# Patient Record
Sex: Male | Born: 1950
Health system: Southern US, Community
[De-identification: ages and names within clinical notes are randomized; demographics above are authoritative.]

## PROBLEM LIST (undated history)

## (undated) DIAGNOSIS — Z974 Presence of external hearing-aid: Secondary | ICD-10-CM

## (undated) DIAGNOSIS — C801 Malignant (primary) neoplasm, unspecified: Secondary | ICD-10-CM

## (undated) DIAGNOSIS — H9190 Unspecified hearing loss, unspecified ear: Secondary | ICD-10-CM

## (undated) DIAGNOSIS — I639 Cerebral infarction, unspecified: Secondary | ICD-10-CM

## (undated) DIAGNOSIS — E039 Hypothyroidism, unspecified: Secondary | ICD-10-CM

## (undated) DIAGNOSIS — T7840XA Allergy, unspecified, initial encounter: Secondary | ICD-10-CM

## (undated) DIAGNOSIS — Z973 Presence of spectacles and contact lenses: Secondary | ICD-10-CM

## (undated) DIAGNOSIS — I10 Essential (primary) hypertension: Secondary | ICD-10-CM

## (undated) DIAGNOSIS — M2410 Other articular cartilage disorders, unspecified site: Secondary | ICD-10-CM

## (undated) DIAGNOSIS — K219 Gastro-esophageal reflux disease without esophagitis: Secondary | ICD-10-CM

## (undated) DIAGNOSIS — M1990 Unspecified osteoarthritis, unspecified site: Secondary | ICD-10-CM

## (undated) DIAGNOSIS — Z9889 Other specified postprocedural states: Secondary | ICD-10-CM

## (undated) DIAGNOSIS — K5792 Diverticulitis of intestine, part unspecified, without perforation or abscess without bleeding: Secondary | ICD-10-CM

## (undated) DIAGNOSIS — E785 Hyperlipidemia, unspecified: Secondary | ICD-10-CM

## (undated) DIAGNOSIS — I2510 Atherosclerotic heart disease of native coronary artery without angina pectoris: Secondary | ICD-10-CM

## (undated) DIAGNOSIS — G4730 Sleep apnea, unspecified: Secondary | ICD-10-CM

## (undated) DIAGNOSIS — K654 Sclerosing mesenteritis: Secondary | ICD-10-CM

## (undated) HISTORY — DX: Hyperlipidemia, unspecified: E78.5

## (undated) HISTORY — PX: COLONOSCOPY: SHX174

## (undated) HISTORY — DX: Cerebral infarction, unspecified: I63.9

## (undated) HISTORY — DX: Allergy, unspecified, initial encounter: T78.40XA

## (undated) HISTORY — DX: Other specified postprocedural states: Z98.890

## (undated) HISTORY — DX: Essential (primary) hypertension: I10

## (undated) HISTORY — DX: Unspecified hearing loss, unspecified ear: H91.90

## (undated) HISTORY — PX: SPINE SURGERY: SHX786

## (undated) HISTORY — DX: Diverticulitis of intestine, part unspecified, without perforation or abscess without bleeding: K57.92

## (undated) HISTORY — DX: Gastro-esophageal reflux disease without esophagitis: K21.9

## (undated) HISTORY — DX: Hypothyroidism, unspecified: E03.9

## (undated) HISTORY — DX: Unspecified osteoarthritis, unspecified site: M19.90

---

## 1976-12-23 HISTORY — PX: BACK SURGERY: SHX140

## 2006-05-06 LAB — HM COLONOSCOPY: HM Colonoscopy: NORMAL

## 2006-05-21 ENCOUNTER — Encounter (INDEPENDENT_AMBULATORY_CARE_PROVIDER_SITE_OTHER): Payer: Self-pay | Admitting: *Deleted

## 2006-06-13 ENCOUNTER — Encounter (INDEPENDENT_AMBULATORY_CARE_PROVIDER_SITE_OTHER): Payer: Self-pay | Admitting: *Deleted

## 2006-06-16 ENCOUNTER — Encounter (INDEPENDENT_AMBULATORY_CARE_PROVIDER_SITE_OTHER): Payer: Self-pay | Admitting: *Deleted

## 2006-08-23 HISTORY — PX: HEMORRHOID SURGERY: SHX153

## 2008-05-18 ENCOUNTER — Encounter (INDEPENDENT_AMBULATORY_CARE_PROVIDER_SITE_OTHER): Payer: Self-pay | Admitting: *Deleted

## 2008-05-26 ENCOUNTER — Encounter: Payer: Self-pay | Admitting: Internal Medicine

## 2008-10-12 ENCOUNTER — Encounter (INDEPENDENT_AMBULATORY_CARE_PROVIDER_SITE_OTHER): Payer: Self-pay | Admitting: *Deleted

## 2009-02-08 ENCOUNTER — Ambulatory Visit: Payer: Self-pay | Admitting: *Deleted

## 2009-02-08 DIAGNOSIS — I10 Essential (primary) hypertension: Secondary | ICD-10-CM | POA: Insufficient documentation

## 2009-02-08 DIAGNOSIS — E785 Hyperlipidemia, unspecified: Secondary | ICD-10-CM

## 2009-02-08 DIAGNOSIS — H9190 Unspecified hearing loss, unspecified ear: Secondary | ICD-10-CM | POA: Insufficient documentation

## 2009-02-08 DIAGNOSIS — R809 Proteinuria, unspecified: Secondary | ICD-10-CM

## 2009-02-08 DIAGNOSIS — Z8719 Personal history of other diseases of the digestive system: Secondary | ICD-10-CM

## 2009-02-08 DIAGNOSIS — K219 Gastro-esophageal reflux disease without esophagitis: Secondary | ICD-10-CM | POA: Insufficient documentation

## 2009-02-08 DIAGNOSIS — J309 Allergic rhinitis, unspecified: Secondary | ICD-10-CM

## 2009-02-08 DIAGNOSIS — E782 Mixed hyperlipidemia: Secondary | ICD-10-CM | POA: Insufficient documentation

## 2009-02-14 DIAGNOSIS — M1990 Unspecified osteoarthritis, unspecified site: Secondary | ICD-10-CM | POA: Insufficient documentation

## 2009-03-10 ENCOUNTER — Ambulatory Visit: Payer: Self-pay | Admitting: *Deleted

## 2009-03-10 DIAGNOSIS — D485 Neoplasm of uncertain behavior of skin: Secondary | ICD-10-CM

## 2009-03-10 LAB — CONVERTED CEMR LAB
ALT: 48 units/L (ref 0–53)
AST: 36 units/L (ref 0–37)
Albumin: 4.6 g/dL (ref 3.5–5.2)
Alkaline Phosphatase: 52 units/L (ref 39–117)
BUN: 20 mg/dL (ref 6–23)
Basophils Absolute: 0 10*3/uL (ref 0.0–0.1)
Basophils Relative: 0.2 % (ref 0.0–3.0)
Bilirubin Urine: NEGATIVE
Bilirubin Urine: NEGATIVE
Blood in Urine, dipstick: NEGATIVE
CO2: 31 meq/L (ref 19–32)
Calcium: 9.9 mg/dL (ref 8.4–10.5)
Chloride: 102 meq/L (ref 96–112)
Cholesterol: 170 mg/dL (ref 0–200)
Creatinine, Ser: 0.8 mg/dL (ref 0.4–1.5)
Eosinophils Absolute: 0.4 10*3/uL (ref 0.0–0.7)
Eosinophils Relative: 6.5 % — ABNORMAL HIGH (ref 0.0–5.0)
GFR calc non Af Amer: 106.57 mL/min (ref >60)
Glucose, Bld: 101 mg/dL — ABNORMAL HIGH (ref 70–99)
Glucose, Urine, Semiquant: NEGATIVE
HCT: 44.7 % (ref 39.0–52.0)
HDL: 37.2 mg/dL — ABNORMAL LOW (ref >39.00)
Hemoglobin, Urine: NEGATIVE
Hemoglobin: 15.9 g/dL (ref 13.0–17.0)
Ketones, ur: NEGATIVE mg/dL
Ketones, urine, test strip: NEGATIVE
LDL Cholesterol: 106 mg/dL — ABNORMAL HIGH (ref 0–99)
Leukocytes, UA: NEGATIVE
Lymphocytes Relative: 36.8 % (ref 12.0–46.0)
Lymphs Abs: 2.3 10*3/uL (ref 0.7–4.0)
MCHC: 35.6 g/dL (ref 30.0–36.0)
MCV: 91.4 fL (ref 78.0–100.0)
Monocytes Absolute: 0.5 10*3/uL (ref 0.1–1.0)
Monocytes Relative: 7.3 % (ref 3.0–12.0)
Neutro Abs: 3.1 10*3/uL (ref 1.4–7.7)
Neutrophils Relative %: 49.2 % (ref 43.0–77.0)
Nitrite: NEGATIVE
Nitrite: NEGATIVE
Platelets: 226 10*3/uL (ref 150.0–400.0)
Potassium: 4.1 meq/L (ref 3.5–5.1)
Protein, U semiquant: NEGATIVE
RBC: 4.89 M/uL (ref 4.22–5.81)
RDW: 12.1 % (ref 11.5–14.6)
Sodium: 139 meq/L (ref 135–145)
Specific Gravity, Urine: <1.005
Specific Gravity, Urine: 1.01 (ref 1.000–1.030)
TSH: 4.51 microintl units/mL (ref 0.35–5.50)
Total Bilirubin: 0.9 mg/dL (ref 0.3–1.2)
Total CHOL/HDL Ratio: 5
Total Protein, Urine: NEGATIVE mg/dL
Total Protein: 7.7 g/dL (ref 6.0–8.3)
Triglycerides: 135 mg/dL (ref 0.0–149.0)
Urine Glucose: NEGATIVE mg/dL
Urobilinogen, UA: 0.2
Urobilinogen, UA: 0.2 (ref 0.0–1.0)
VLDL: 27 mg/dL (ref 0.0–40.0)
WBC Urine, dipstick: NEGATIVE
WBC: 6.3 10*3/uL (ref 4.5–10.5)
pH: 5
pH: 5.5 (ref 5.0–8.0)

## 2009-07-10 ENCOUNTER — Ambulatory Visit: Payer: Self-pay | Admitting: Occupational Medicine

## 2009-07-17 ENCOUNTER — Ambulatory Visit: Payer: Self-pay | Admitting: Family Medicine

## 2009-09-05 ENCOUNTER — Ambulatory Visit: Payer: Self-pay | Admitting: Internal Medicine

## 2009-10-18 ENCOUNTER — Encounter: Payer: Self-pay | Admitting: Internal Medicine

## 2009-11-21 ENCOUNTER — Ambulatory Visit: Payer: Self-pay | Admitting: Internal Medicine

## 2009-11-24 ENCOUNTER — Telehealth: Payer: Self-pay | Admitting: Internal Medicine

## 2009-11-27 ENCOUNTER — Encounter: Payer: Self-pay | Admitting: Internal Medicine

## 2009-11-30 ENCOUNTER — Ambulatory Visit: Payer: Self-pay | Admitting: Internal Medicine

## 2009-11-30 LAB — CONVERTED CEMR LAB
ALT: 33 units/L (ref 0–53)
AST: 25 units/L (ref 0–37)
Albumin: 4.5 g/dL (ref 3.5–5.2)
Alkaline Phosphatase: 55 units/L (ref 39–117)
BUN: 20 mg/dL (ref 6–23)
Bilirubin, Direct: 0.1 mg/dL (ref 0.0–0.3)
CO2: 24 meq/L (ref 19–32)
Calcium: 9.4 mg/dL (ref 8.4–10.5)
Chloride: 102 meq/L (ref 96–112)
Cholesterol: 155 mg/dL (ref 0–200)
Creatinine, Ser: 1.18 mg/dL (ref 0.40–1.50)
Glucose, Bld: 98 mg/dL (ref 70–99)
HDL: 35 mg/dL — ABNORMAL LOW (ref >39)
Indirect Bilirubin: 0.4 mg/dL (ref 0.0–0.9)
LDL Cholesterol: 94 mg/dL (ref 0–99)
Potassium: 4.5 meq/L (ref 3.5–5.3)
Sodium: 138 meq/L (ref 135–145)
TSH: 8.99 microintl units/mL — ABNORMAL HIGH (ref 0.350–4.500)
Total Bilirubin: 0.5 mg/dL (ref 0.3–1.2)
Total CHOL/HDL Ratio: 4.4
Total Protein: 7.2 g/dL (ref 6.0–8.3)
Triglycerides: 129 mg/dL (ref <150)
VLDL: 26 mg/dL (ref 0–40)

## 2009-12-08 ENCOUNTER — Ambulatory Visit: Payer: Self-pay | Admitting: Internal Medicine

## 2009-12-08 DIAGNOSIS — E039 Hypothyroidism, unspecified: Secondary | ICD-10-CM

## 2009-12-28 ENCOUNTER — Ambulatory Visit: Payer: Self-pay | Admitting: Family Medicine

## 2010-01-04 ENCOUNTER — Encounter: Payer: Self-pay | Admitting: Internal Medicine

## 2010-01-27 ENCOUNTER — Ambulatory Visit: Payer: Self-pay | Admitting: Family Medicine

## 2010-02-13 ENCOUNTER — Ambulatory Visit: Payer: Self-pay | Admitting: Internal Medicine

## 2010-02-13 LAB — CONVERTED CEMR LAB: TSH: 3.738 microintl units/mL (ref 0.350–4.500)

## 2010-02-14 ENCOUNTER — Telehealth: Payer: Self-pay | Admitting: Internal Medicine

## 2010-04-09 ENCOUNTER — Ambulatory Visit: Payer: Self-pay | Admitting: Internal Medicine

## 2010-04-09 DIAGNOSIS — M722 Plantar fascial fibromatosis: Secondary | ICD-10-CM

## 2010-04-09 LAB — CONVERTED CEMR LAB: TSH: 3.362 microintl units/mL (ref 0.350–4.500)

## 2010-04-12 ENCOUNTER — Encounter: Payer: Self-pay | Admitting: Internal Medicine

## 2010-04-15 ENCOUNTER — Telehealth: Payer: Self-pay | Admitting: Internal Medicine

## 2010-06-07 ENCOUNTER — Encounter: Payer: Self-pay | Admitting: Internal Medicine

## 2010-06-07 LAB — CONVERTED CEMR LAB: TSH: 2.857 microintl units/mL (ref 0.350–4.500)

## 2010-06-11 ENCOUNTER — Ambulatory Visit: Payer: Self-pay | Admitting: Internal Medicine

## 2010-07-31 ENCOUNTER — Inpatient Hospital Stay (HOSPITAL_COMMUNITY): Admission: AD | Admit: 2010-07-31 | Discharge: 2010-08-02 | Payer: Self-pay | Admitting: Cardiology

## 2010-07-31 ENCOUNTER — Ambulatory Visit: Payer: Self-pay | Admitting: Cardiology

## 2010-07-31 ENCOUNTER — Ambulatory Visit: Payer: Self-pay | Admitting: Diagnostic Radiology

## 2010-07-31 ENCOUNTER — Telehealth: Payer: Self-pay | Admitting: Internal Medicine

## 2010-07-31 ENCOUNTER — Encounter: Payer: Self-pay | Admitting: Emergency Medicine

## 2010-07-31 ENCOUNTER — Ambulatory Visit: Payer: Self-pay | Admitting: Internal Medicine

## 2010-08-01 HISTORY — PX: CARDIAC CATHETERIZATION: SHX172

## 2010-08-09 ENCOUNTER — Ambulatory Visit: Payer: Self-pay | Admitting: Internal Medicine

## 2010-08-10 ENCOUNTER — Encounter: Payer: Self-pay | Admitting: Internal Medicine

## 2010-09-05 ENCOUNTER — Ambulatory Visit: Payer: Self-pay | Admitting: Cardiology

## 2010-09-05 ENCOUNTER — Encounter: Payer: Self-pay | Admitting: Cardiology

## 2010-09-24 ENCOUNTER — Ambulatory Visit: Payer: Self-pay | Admitting: Family Medicine

## 2010-09-25 ENCOUNTER — Ambulatory Visit: Payer: Self-pay | Admitting: Internal Medicine

## 2010-09-27 ENCOUNTER — Ambulatory Visit (HOSPITAL_BASED_OUTPATIENT_CLINIC_OR_DEPARTMENT_OTHER): Admission: RE | Admit: 2010-09-27 | Discharge: 2010-09-27 | Payer: Self-pay | Admitting: Internal Medicine

## 2010-09-27 ENCOUNTER — Ambulatory Visit: Payer: Self-pay | Admitting: Diagnostic Radiology

## 2010-11-02 ENCOUNTER — Encounter: Payer: Self-pay | Admitting: Internal Medicine

## 2010-11-09 ENCOUNTER — Telehealth: Payer: Self-pay | Admitting: Internal Medicine

## 2010-11-12 ENCOUNTER — Ambulatory Visit: Payer: Self-pay | Admitting: Internal Medicine

## 2010-11-12 DIAGNOSIS — C61 Malignant neoplasm of prostate: Secondary | ICD-10-CM

## 2010-11-16 ENCOUNTER — Ambulatory Visit: Payer: Self-pay | Admitting: Family Medicine

## 2010-11-21 ENCOUNTER — Encounter: Payer: Self-pay | Admitting: Internal Medicine

## 2010-11-29 ENCOUNTER — Telehealth: Payer: Self-pay | Admitting: Internal Medicine

## 2010-12-10 ENCOUNTER — Telehealth: Payer: Self-pay | Admitting: Internal Medicine

## 2010-12-14 ENCOUNTER — Telehealth: Payer: Self-pay | Admitting: Internal Medicine

## 2010-12-31 ENCOUNTER — Ambulatory Visit
Admission: RE | Admit: 2010-12-31 | Discharge: 2010-12-31 | Payer: Self-pay | Source: Home / Self Care | Attending: Family Medicine | Admitting: Family Medicine

## 2010-12-31 DIAGNOSIS — M25529 Pain in unspecified elbow: Secondary | ICD-10-CM | POA: Insufficient documentation

## 2010-12-31 DIAGNOSIS — M7710 Lateral epicondylitis, unspecified elbow: Secondary | ICD-10-CM | POA: Insufficient documentation

## 2011-01-08 ENCOUNTER — Encounter
Admission: RE | Admit: 2011-01-08 | Discharge: 2011-01-21 | Payer: Self-pay | Source: Home / Self Care | Attending: Family Medicine | Admitting: Family Medicine

## 2011-01-22 NOTE — Letter (Signed)
Summary: New Patient letter  [facility]  [address] [place], [place] [address]   Phone: [phone]  Fax: [phone]       08/10/2010 MRN: [id number]  Zachary [name] [address] [place], [place]  [address]  Dear Zachary [name],  Welcome to the Gastroenterology Division at [facility].    You are scheduled to see Dr.  Marina Goodell  on 09-25-10 at 3:30pm on the 3rd floor at [facility], [address]  We ask that you try to arrive at our office 15 minutes prior to your appointment time to allow for check-in.  We would like you to complete the enclosed self-administered evaluation form prior to your visit and bring it with you on the day of your appointment.  We will review it with you.  Also, please bring a complete list of all your medications or, if you prefer, bring the medication bottles and we will list them.  Please bring your insurance card so that we may make a copy of it.  If your insurance requires a referral to see a specialist, please bring your referral form from your primary care physician.  Co-payments are due at the time of your visit and may be paid by cash, check or credit card.     Your office visit will consist of a consult with your physician (includes a physical exam), any laboratory testing he/she may order, scheduling of any necessary diagnostic testing (e.g. x-ray, ultrasound, CT-scan), and scheduling of a procedure (e.g. Endoscopy, Colonoscopy) if required.  Please allow enough time on your schedule to allow for any/all of these possibilities.    If you cannot keep your appointment, please call [phone] to cancel or reschedule prior to your appointment date.  This allows [place] the opportunity to schedule an appointment for another patient in need of care.  If you do not cancel or reschedule by 5 p.m. the business day prior to your appointment date, you will be charged a $50.00 late cancellation/no-show fee.    Thank you for choosing  [facility] for your medical needs.  We appreciate the opportunity to care for you.  Please visit [place] at our website  to learn more about our practice.                     Sincerely,                                                             The Gastroenterology Division

## 2011-01-22 NOTE — Assessment & Plan Note (Signed)
Summary: EARACHE/KH   Vital Signs:  Patient Profile:   60 Years Old Male CC:      Rt earache x 2 days Height:     68 inches Weight:      196 pounds O2 Sat:      94 % O2 treatment:    Room Air Temp:     97.1 degrees F oral Pulse rate:   87 / minute Pulse rhythm:   regular Resp:     12 per minute BP sitting:   127 / 76  (right arm) Cuff size:   regular  Vitals Entered By: Emilio Math (December 28, 2009 3:12 PM)                  Current Allergies: ! SIMVASTATINHistory of Present Illness Chief Complaint: Rt earache x 2 days History of Present Illness: RIGHT EAR DISCOMFORT ONSET 2 DAYS. USES HEARINGAIDS. HAS NOTED SOME BLOOD ON PILLOW. NO RUNNY NOSE, NOS COUGH, NO FEVER.   Current Meds LISINOPRIL-HYDROCHLOROTHIAZIDE 10-12.5 MG TABS (LISINOPRIL-HYDROCHLOROTHIAZIDE) Take 1/2 tablet by mouth once a day OMEPRAZOLE 20 MG CPDR (OMEPRAZOLE) Take 1 tablet by mouth once a day GNP LORATADINE 10 MG TABS (LORATADINE) Take 1 tablet by mouth once a day PRAVASTATIN SODIUM 40 MG TABS (PRAVASTATIN SODIUM) one by mouth qpm MULTIVITAMINS  TABS (MULTIPLE VITAMIN) Take 1 tablet by mouth once a day FISH OIL 1200 MG CAPS (OMEGA-3 FATTY ACIDS) 2 capsules by mouth once daily LEVOTHYROXINE SODIUM 50 MCG TABS (LEVOTHYROXINE SODIUM) 1/2 tabs x 2 weeks, then one by mouth once daily  REVIEW OF SYSTEMS Constitutional Symptoms      Denies fever, chills, night sweats, weight loss, weight gain, and fatigue.  Eyes       Denies change in vision, eye pain, eye discharge, glasses, contact lenses, and eye surgery. Ear/Nose/Throat/Mouth       Complains of ear pain and ear discharge.      Denies hearing loss/aids, change in hearing, dizziness, frequent runny nose, frequent nose bleeds, sinus problems, sore throat, hoarseness, and tooth pain or bleeding.  Respiratory       Denies dry cough, productive cough, wheezing, shortness of breath, asthma, bronchitis, and emphysema/COPD.  Cardiovascular       Denies  murmurs, chest pain, and tires easily with exhertion.    Gastrointestinal       Denies stomach pain, nausea/vomiting, diarrhea, constipation, blood in bowel movements, and indigestion. Genitourniary       Denies painful urination, kidney stones, and loss of urinary control. Neurological       Denies paralysis, seizures, and fainting/blackouts. Musculoskeletal       Denies muscle pain, joint pain, joint stiffness, decreased range of motion, redness, swelling, muscle weakness, and gout.  Skin       Denies bruising, unusual mles/lumps or sores, and hair/skin or nail changes.  Psych       Denies mood changes, temper/anger issues, anxiety/stress, speech problems, depression, and sleep problems.  Past History:  Past Medical History: Reviewed history from 12/08/2009 and no changes required. Diverticulitis, hx of GERD Hyperlipidemia  Hypertension   left shoulder arthritis bilateral hearing aids Allergic rhinitis Osteoarthritis hemorrhoids  Past Surgical History: Reviewed history from 12/08/2009 and no changes required. herniated disc surgery 1978 Hemorrhoidectomy cyst removal     Family History: Reviewed history from 12/08/2009 and no changes required. Family History of Alcoholism/Addiction Family History of Arthritis Family History of  breast cancer Family History of  prostate cancer Family History of  CAD  Social History: Reviewed history from 12/08/2009 and no changes required. 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 IRRIGATION ATTENPTED. UN SUCESSFUL Assessment New Problems: EAR PAIN, RIGHT (ICD-388.70)     Patient Instructions: 1)  RECOMMEND ENT EVAL

## 2011-01-22 NOTE — Assessment & Plan Note (Signed)
Summary: pain in heel of foot/mhf 2:45   Vital Signs:  Patient profile:   60 year old male Height:      68 inches Weight:      198.75 pounds BMI:     30.33 O2 Sat:      98 % on Room air Temp:     98.0 degrees F oral Pulse rate:   73 / minute Pulse rhythm:   regular Resp:     16 per minute BP sitting:   110 / 80  (right arm) Cuff size:   large  Vitals Entered By: Glendell Docker CMA (April 09, 2010 2:58 PM)  O2 Flow:  Room air CC: Rm 2- Right foot discomort    Primary Care Provider:  DThomos Lemons DO  CC:  Rm 2- Right foot discomort .  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   Lungs:  normal respiratory effort and normal breath sounds.   Heart:  normal rate, regular rhythm, and no gallop.   Msk:  right medial heel tenderness Neurologic:  cranial nerves II-XII intact and gait normal.     Impression & Recommendations:  Problem # 1:  PLANTAR FASCIITIS (ICD-728.71)  moderate to severe right heel pain c/w plantar fascitis.   refer to Dr. Victorino Dike stretching exercises and use of arch  support / heel cushion reviewed I provided educational material.   Orders: Orthopedic Referral (Ortho)  Complete Medication List: 1)  Lisinopril-hydrochlorothiazide 10-12.5 Mg Tabs (Lisinopril-hydrochlorothiazide) .... Take 1/2 tablet by mouth once a day 2)  Omeprazole 20 Mg Cpdr (Omeprazole) .... Take 1 tablet by mouth once a day 3)  Gnp Loratadine 10 Mg Tabs (Loratadine) .... Take 1 tablet by mouth once a day 4)  Pravastatin Sodium 40 Mg Tabs (Pravastatin sodium) .... One by mouth qpm 5)  Multivitamins Tabs (Multiple vitamin) .... Take 1 tablet by mouth once a day 6)  Fish Oil 1200 Mg Caps (Omega-3 fatty acids) .... 2 capsules by mouth once daily 7)  Levothyroxine Sodium 88 Mcg Tabs (Levothyroxine sodium) .... One by mouth once daily 8)  Amitriptyline Hcl 10 Mg Tabs (Amitriptyline hcl) .... One tab by mouth at bedtime prn 9)  Pennsaid 1.5 % Soln (Diclofenac sodium) .... Use three times a day  Patient Instructions: 1)  Please schedule a follow-up appointment in 3 months.  Current Allergies (reviewed today): ! SIMVASTATIN

## 2011-01-22 NOTE — Progress Notes (Signed)
Summary: REFILL REQUEST TO [facility]   Phone Note Refill Request Message from:  Fax from Pharmacy on February 14, 2010 8:41 AM  Refills Requested: Medication #1:  LEVOTHYROXINE SODIUM 50 MCG TABS 1/2 tabs x 2 weeks   Dosage confirmed as above?Dosage Confirmed   Brand Name Necessary? No   Supply Requested: 1 month   Last Refilled: 01/08/2010 [facility] [address] [place] [address] [phone] FAX [phone]    Method Requested: Electronic Next Appointment Scheduled: 06-04-10 LAB  Initial call taken by: Roselle Locus,  February 14, 2010 8:42 AM  Follow-up for Phone Call        patient advised medication change to per Dr Artist Pais instructions, he was aslo advised to return in 2 months for a Tsh. Patient verbalized understanding and agrees Follow-up by: Glendell Docker CMA,  February 14, 2010 9:15 AM    New/Updated Medications: LEVOTHYROXINE SODIUM 75 MCG TABS (LEVOTHYROXINE SODIUM) Take 1 tablet by mouth once a day Prescriptions: LEVOTHYROXINE SODIUM 75 MCG TABS (LEVOTHYROXINE SODIUM) Take 1 tablet by mouth once a day  #30 x 2   Entered by:   Glendell Docker CMA   Authorized by:   D. Thomos Lemons DO   Signed by:   Glendell Docker CMA on 02/14/2010   Method used:   Electronically to        [facility]* (retail)       [address]       [address] [place] [address]mailing       [place], [place]  [address]       Ph: [phone]       Fax: [phone]   RxID:   [id number]

## 2011-01-22 NOTE — Assessment & Plan Note (Signed)
Summary: 2 mo. f/u - jr   Vital Signs:  Patient profile:   60 year old male Weight:      201.50 pounds BMI:     30.75 O2 Sat:      95 % on Room air Temp:     97.7 degrees F oral Pulse rate:   74 / minute Pulse rhythm:   regular Resp:     18 per minute BP sitting:   100 / 70  (left arm) Cuff size:   large  Vitals Entered By: Glendell Docker CMA (February 13, 2010 2:56 PM)  O2 Flow:  Room air CC: RM 3-2 Month Follow up disease management Comments 2 Month Follow up on Thyroid,  Refill on Pravastatin and Levothyroid   Primary Care Provider:  Dondra Spry DO  CC:  RM 3-2 Month Follow up disease management.  History of Present Illness:  Hyperlipidemia Follow-Up      This is a 60 year old man who presents for Hyperlipidemia follow-up.  The patient reports fatigue, but denies muscle aches.  The patient denies the following symptoms: chest pain/pressure.  Compliance with medications (by patient report) has been near 100%.  Dietary compliance has been fair.    hypothyroidism - pt hasnt noticed significant change in energy but wife thinks he is more active.  he c/o chronic insomnia.  he works as Electrical engineer.  works third shift.  Allergies: 1)  ! 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  he got back from cruise recently and "ate too much"  Physical Exam  General:   alert, well-developed, and well-nourished.   Lungs:  normal respiratory effort and normal breath sounds.   Heart:  normal rate, regular rhythm, and no gallop.   Extremities:  No lower extremity edema  Neurologic:  cranial nerves II-XII intact and gait normal.   Psych:  normally interactive, good eye contact, not anxious appearing, and not depressed appearing.     Impression & Recommendations:  Problem # 1:  HYPOTHYROIDISM (ICD-244.9) Repeat TFTs.  goal TSH betw 1-2 His updated medication list for this problem includes:    Levothyroxine Sodium 50 Mcg Tabs (Levothyroxine sodium) .Marland Kitchen... 1/2 tabs x 2 weeks, then one by mouth once daily  Orders: T-TSH ([address])  Labs Reviewed: TSH: 8.990 (11/30/2009)    Chol: 155 (11/30/2009)   HDL: 35 (11/30/2009)   LDL: 94 (11/30/2009)   TG: 129 (11/30/2009)  Problem # 2:  INSOMNIA, CHRONIC (ICD-307.42) Pt works night shift.  He has chronic insomnia / poor sleep.  he does not want to take sleep aid that is potentially habit forming.  use low dose amitriptyline.  Problem # 3:  HYPERLIPIDEMIA (ICD-272.4) stable.  Maintain current medication regimen.  His updated medication list for this problem includes:    Pravastatin Sodium 40 Mg Tabs (Pravastatin sodium) ..... One by mouth qpm  Labs Reviewed: SGOT: 25 (11/30/2009)  SGPT: 33 (11/30/2009)   HDL:35 (11/30/2009), 37.20 (03/10/2009)  LDL:94 (11/30/2009), 106 (34/74/2595)  Chol:155 (11/30/2009), 170 (03/10/2009)  Trig:129 (11/30/2009), 135.0 (03/10/2009)  Complete Medication List: 1)  Lisinopril-hydrochlorothiazide 10-12.5 Mg Tabs (Lisinopril-hydrochlorothiazide) .... Take 1/2 tablet by mouth once a day 2)  Omeprazole 20 Mg Cpdr (Omeprazole) .... Take 1 tablet by mouth once a day 3)  Gnp Loratadine 10 Mg Tabs (Loratadine) .... Take 1 tablet by mouth once a day 4)  Pravastatin Sodium 40 Mg Tabs (Pravastatin sodium) .... One by mouth qpm 5)  Multivitamins Tabs (Multiple vitamin) .... Take 1 tablet  by mouth once a day 6)  Fish Oil 1200 Mg Caps (Omega-3 fatty acids) .... 2 capsules by mouth once daily 7)  Levothyroxine Sodium 50 Mcg Tabs (Levothyroxine sodium) .... 1/2 tabs x 2 weeks, then one by mouth once daily 8)  Amitriptyline Hcl 10 Mg Tabs (Amitriptyline hcl) .... One tab by mouth at bedtime prn  Patient Instructions: 1)  Please schedule a follow-up appointment in 4 months. 2)  TSH prior to visit, ICD-9: 244.90 3)  Please return for lab work one (1) week before your next appointment.  Prescriptions: AMITRIPTYLINE HCL 10 MG TABS (AMITRIPTYLINE HCL) one tab by mouth at bedtime prn  #30 x 3   Entered and Authorized by:   D. Thomos Lemons DO   Signed by:   D. Thomos Lemons DO on 02/13/2010   Method used:   Electronically to        [facility]* (retail)       [address]       [address] Shipping/mailing       [place], [place]  [address]       Ph: [phone]       Fax: [phone]   RxID:   [id number] PRAVASTATIN SODIUM 40 MG TABS (PRAVASTATIN SODIUM) one by mouth qpm  #90 x 3   Entered and Authorized by:   D. Thomos Lemons DO   Signed by:   D. Thomos Lemons DO on 02/13/2010   Method used:   Electronically to        [facility]* (retail)       [address]       [address] Shipping/mailing       [place], [place]  [address]       Ph: [phone]       Fax: [phone]   RxID:   [id number] AMITRIPTYLINE HCL 10 MG TABS (AMITRIPTYLINE HCL) one tab by mouth at bedtime prn  #30 x 3   Entered and Authorized by:   D. Thomos Lemons DO   Signed by:   D. Thomos Lemons DO on 02/13/2010   Method used:   Electronically to        [facility]* (retail)       [address]       [place], [place]  [address]       Ph: [phone] or [phone]       Fax: [phone]   RxID:   [id number]   Current Allergies (reviewed today): ! SIMVASTATIN

## 2011-01-22 NOTE — Consult Note (Signed)
Summary: [facility]  [facility]   Imported By: Lanelle Bal 04/23/2010 09:51:05  _____________________________________________________________________  External Attachment:    Type:   Image     Comment:   External Document

## 2011-01-22 NOTE — Progress Notes (Signed)
Summary: Lab Results  Phone Note Outgoing Call   Summary of Call: call pt   - thyroid medication needs to be increased - see rx.  schedule repeat TSH in 2 months Initial call taken by: D. Thomos Lemons DO,  April 15, 2010 5:02 PM  Follow-up for Phone Call        patient advised per Dr Artist Pais instructions, lab order enetered for 06/11/2010 @ [facility] Follow-up by: Glendell Docker CMA,  April 16, 2010 10:58 AM    New/Updated Medications: LEVOTHYROXINE SODIUM 88 MCG TABS (LEVOTHYROXINE SODIUM) one by mouth once daily Prescriptions: LEVOTHYROXINE SODIUM 88 MCG TABS (LEVOTHYROXINE SODIUM) one by mouth once daily  #30 x 2   Entered and Authorized by:   D. Thomos Lemons DO   Signed by:   D. Thomos Lemons DO on 04/15/2010   Method used:   Electronically to        [facility]* (retail)       [address]       [place], [place]  [address]       Ph: [phone] or [phone]       Fax: [phone]   RxID:   [id number]

## 2011-01-22 NOTE — Assessment & Plan Note (Signed)
Summary: 245 APPT--OK TO ADD PER MD/CHEST TIGHTNESS/KB   Vital Signs:  Patient profile:   60 year old male Height:      68 inches (172.72 cm) Weight:      196.25 pounds (89.20 kg) BMI:     29.95 O2 Sat:      96 % on Room air Temp:     98.0 degrees F (36.67 degrees C) oral Pulse rate:   84 / minute BP sitting:   130 / 100  (right arm) Cuff size:   regular  Vitals Entered By: Lucious Groves CMA (July 31, 2010 2:58 PM)  O2 Flow:  Room air CC: C/O chest tightness that started this morning./kb Is Patient Diabetic? No Pain Assessment Patient in pain? no      Comments Patient think that this is due to "muscle issue". He notes that left arm tingling will last only 1-2 minutes and then goes away. Patient denies SOB, HA, and blurry vision./kb   Primary Care Provider:  DThomos Lemons DO  CC:  C/O chest tightness that started this morning./kb.  History of Present Illness: 60 y/o white male with hx of htn, and hyperlipidema complains of exertiona substernal chest pain. 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 He describes sharp substernal chest pain - severity 6 out of 10.  Lasts 1 minute that improves with rest.  Pain radiates to left arm  he attributed symptmos  to muscle pain but no recent injury or lifting  GERD - he take PPI regularly  Current Medications (verified): 1)  Lisinopril-Hydrochlorothiazide 10-12.5 Mg Tabs (Lisinopril-Hydrochlorothiazide) .... Take 1/2 Tablet By Mouth Once A Day 2)  Omeprazole 20 Mg Cpdr (Omeprazole) .... Take 1 Tablet By Mouth Once A Day 3)  Gnp Loratadine 10 Mg Tabs (Loratadine) .... Take 1 Tablet By Mouth Once A Day 4)  Pravastatin Sodium 40 Mg Tabs (Pravastatin Sodium) .... One By Mouth Qpm 5)  Multivitamins  Tabs (Multiple Vitamin) .... Take 1 Tablet By Mouth Once A Day 6)  Fish Oil 1200 Mg Caps (Omega-3 Fatty Acids)  .... 2 Capsules By Mouth Once Daily 7)  Levothyroxine Sodium 100 Mcg Tabs (Levothyroxine Sodium) .... One By Mouth Once Daily 8)  Pennsaid 1.5 % Soln (Diclofenac Sodium) .... Use Three Times A Day  Allergies (verified): 1)  ! Simvastatin  Past History:  Past Medical History: Diverticulitis, hx of GERD  Hyperlipidemia   Hypertension      left shoulder arthritis bilateral hearing aids Allergic rhinitis Osteoarthritis hemorrhoids  Past Surgical History: herniated disc surgery 1978 Hemorrhoidectomy cyst removal          Review of Systems       no lower ext swelling  Physical Exam  General:  alert, well-developed, and well-nourished.   Head:  normocephalic and atraumatic.   Ears:  R ear normal and L ear normal.   Mouth:  pharynx pink and moist.   Neck:  supple, no masses, and no neck tenderness.   Lungs:  normal respiratory effort, normal breath sounds, no crackles, and no wheezes.   Heart:  normal rate, regular rhythm, no murmur, and no gallop.   Abdomen:  soft, non-tender, normal bowel sounds, no masses, no hepatomegaly, and no splenomegaly.   Extremities:  No lower extremity edema no calf tenderness Neurologic:  cranial nerves II-XII intact and gait normal.  Impression & Recommendations:  Problem # 1:  CHEST PAIN, ACUTE (ICD-786.50) 60 y/o with hx of htn and hyperlipidemia with exertional chest pain.   admit for acute coronary syndrome rule out EKG - NSR 78 bpm.  Possible Left atrial enlargement Cycle cardiac enzymes, check D Dimer, consult cardiology  spoke with ER charge nurse aspirin as per ER staff  Problem # 2:  HYPERTENSION (ICD-401.9)  His updated medication list for this problem includes:    Lisinopril-hydrochlorothiazide 10-12.5 Mg Tabs (Lisinopril-hydrochlorothiazide) .Marland Kitchen... Take 1/2 tablet by mouth once a day  BP today: 130/100 Prior BP: 110/80 (06/11/2010)  Labs Reviewed: K+: 4.5 (11/30/2009) Creat: : 1.18 (11/30/2009)   Chol: 155  (11/30/2009)   HDL: 35 (11/30/2009)   LDL: 94 (11/30/2009)   TG: 129 (11/30/2009)  Problem # 3:  HYPERLIPIDEMIA (ICD-272.4)  His updated medication list for this problem includes:    Pravastatin Sodium 40 Mg Tabs (Pravastatin sodium) ..... One by mouth qpm  Labs Reviewed: SGOT: 25 (11/30/2009)   SGPT: 33 (11/30/2009)   HDL:35 (11/30/2009), 37.20 (03/10/2009)  LDL:94 (11/30/2009), 106 (16/09/9603)  Chol:155 (11/30/2009), 170 (03/10/2009)  Trig:129 (11/30/2009), 135.0 (03/10/2009)  Complete Medication List: 1)  Lisinopril-hydrochlorothiazide 10-12.5 Mg Tabs (Lisinopril-hydrochlorothiazide) .... Take 1/2 tablet by mouth once a day 2)  Omeprazole 20 Mg Cpdr (Omeprazole) .... Take 1 tablet by mouth once a day 3)  Gnp Loratadine 10 Mg Tabs (Loratadine) .... Take 1 tablet by mouth once a day 4)  Pravastatin Sodium 40 Mg Tabs (Pravastatin sodium) .... One by mouth qpm 5)  Multivitamins Tabs (Multiple vitamin) .... Take 1 tablet by mouth once a day 6)  Fish Oil 1200 Mg Caps (Omega-3 fatty acids) .... 2 capsules by mouth once daily 7)  Levothyroxine Sodium 100 Mcg Tabs (Levothyroxine sodium) .... One by mouth once daily 8)  Pennsaid 1.5 % Soln (Diclofenac sodium) .... Use three times a day  Patient Instructions: 1)  Hospital admission for chest pain

## 2011-01-22 NOTE — Progress Notes (Signed)
Summary: needs appt.  Phone Note Outgoing Call   Summary of Call: call pt - I suggest OV to discuss elevated PSA 4.1 Initial call taken by: D. Thomos Lemons DO,  November 09, 2010 4:58 PM  Follow-up for Phone Call        Pt notified and scheduled f/u for 11/13/10 @ 10:00 am. Mervin Kung CMA Duncan Dull)  November 09, 2010 5:04 PM

## 2011-01-22 NOTE — Progress Notes (Signed)
Summary: chest tightness  Phone Note Call from Patient Call back at [phone]   Summary of Call: Patient would like to know if there is any reason why he cannot take aspirin? Patient has a little tightness in his chest and wanted to be sure before he took one. Please advise. Initial call taken by: Lucious Groves CMA,  July 31, 2010 11:33 AM  Follow-up for Phone Call        Patient states that it seems to be a "muscular thing" and has been tight all day. Pt denies SOB, and numbness of limbs. Patient is aware that he may need to go to the ER and states that he "hates to waste the money".  Should the patient be added on today for office visit or other recommendation? Please advise.  Follow-up by: Lucious Groves CMA,  July 31, 2010 12:04 PM  Additional Follow-up for Phone Call Additional follow up Details #1::        no reason why he can not take aspirin.  I suggest OV today.  ok to add on schdule.   Additional Follow-up by: D. Thomos Lemons DO,  July 31, 2010 1:00 PM    Additional Follow-up for Phone Call Additional follow up Details #2::    Patient notified and will come today. Follow-up by: Lucious Groves CMA,  July 31, 2010 1:46 PM

## 2011-01-22 NOTE — Assessment & Plan Note (Signed)
Summary: Sinus pressure, cough-dry, runny nose x 1 day rm 2   Vital Signs:  Patient Profile:   60 Years Old Male CC:      Cold & URI symptoms Height:     68 inches Weight:      197 pounds O2 Sat:      100 % O2 treatment:    Room Air Temp:     97.4 degrees F oral Pulse rate:   90 / minute Pulse rhythm:   regular Resp:     16 per minute BP sitting:   142 / 88  (right arm) Cuff size:   regular  Vitals Entered By: Areta Haber CMA (January 27, 2010 11:51 AM)                  Current Allergies: ! SIMVASTATIN    History of Present Illness Chief Complaint: Cold & URI symptoms History of Present Illness: Subjective: Patient complains of onset of mild URI symptoms 2 days ago.  He has had a flu shot. Minimal sore throat, resolved + mild cough No pleuritic pain No wheezing + nasal congestion No post-nasal drainage No sinus pain/pressure No itchy/red eyes No earache No hemoptysis No SOB No fever, + chills No nausea No vomiting No abdominal pain No diarrhea No skin rashes No fatigue No myalgias No headache Used OTC meds without relief   Current Problems: URI (ICD-465.9) EAR PAIN, RIGHT (ICD-388.70) HYPOTHYROIDISM (ICD-244.9) INGROWN NAIL (ICD-703.0) ENCOUNTER FOR LONG-TERM USE OF OTHER MEDICATIONS (ICD-V58.69) NEOPLASM OF UNCERTAIN BEHAVIOR OF SKIN (ICD-238.2) HEMORRHOIDS (ICD-455.6) OSTEOARTHRITIS (ICD-715.90) ALLERGIC RHINITIS (ICD-477.9) HEARING DEFICIT (ICD-389.9) PROTEINURIA (ICD-791.0) FAMILY HISTORY OF ALCOHOLISM/ADDICTION (ICD-V61.41) HYPERTENSION (ICD-401.9) HYPERLIPIDEMIA (ICD-272.4) GERD (ICD-530.81) DIVERTICULITIS, HX OF (ICD-V12.79)   Current Meds LISINOPRIL-HYDROCHLOROTHIAZIDE 10-12.5 MG TABS (LISINOPRIL-HYDROCHLOROTHIAZIDE) Take 1/2 tablet by mouth once a day OMEPRAZOLE 20 MG CPDR (OMEPRAZOLE) Take 1 tablet by mouth once a day GNP LORATADINE 10 MG TABS (LORATADINE) Take 1 tablet by mouth once a day PRAVASTATIN SODIUM 40 MG TABS  (PRAVASTATIN SODIUM) one by mouth qpm MULTIVITAMINS  TABS (MULTIPLE VITAMIN) Take 1 tablet by mouth once a day FISH OIL 1200 MG CAPS (OMEGA-3 FATTY ACIDS) 2 capsules by mouth once daily LEVOTHYROXINE SODIUM 50 MCG TABS (LEVOTHYROXINE SODIUM) 1/2 tabs x 2 weeks, then one by mouth once daily BENZONATATE 200 MG CAPS (BENZONATATE) One by mouth hs as needed cough AZITHROMYCIN 250 MG TABS (AZITHROMYCIN) Two tabs by mouth on day 1, then 1 tab daily on days 2 through 5  REVIEW OF SYSTEMS Constitutional Symptoms      Denies fever, chills, night sweats, weight loss, weight gain, and fatigue.  Eyes       Complains of eye pain.      Denies change in vision, eye discharge, glasses, contact lenses, and eye surgery.      Comments: Both Ear/Nose/Throat/Mouth       Complains of frequent runny nose and sinus problems.      Denies hearing loss/aids, change in hearing, ear pain, ear discharge, dizziness, frequent nose bleeds, sore throat, hoarseness, and tooth pain or bleeding.      Comments:  x 1 day Respiratory       Complains of dry cough.      Denies productive cough, wheezing, shortness of breath, asthma, bronchitis, and emphysema/COPD.  Cardiovascular       Denies murmurs, chest pain, and tires easily with exhertion.    Gastrointestinal       Denies stomach pain, nausea/vomiting, diarrhea, constipation, blood in bowel  movements, and indigestion. Genitourniary       Denies painful urination, kidney stones, and loss of urinary control. Neurological       Denies paralysis, seizures, and fainting/blackouts. Musculoskeletal       Denies muscle pain, joint pain, joint stiffness, decreased range of motion, redness, swelling, muscle weakness, and gout.  Skin       Denies bruising, unusual mles/lumps or sores, and hair/skin or nail changes.  Psych       Denies mood changes, temper/anger issues, anxiety/stress, speech problems, depression, and sleep problems.  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  Extraocular movement is intact.  Conjunctivae are not inflamed.  Ears:  Canals normal.  Tympanic membranes normal.   Nose:  Congested turbinates on the right, no sinus tenderness Pharynx:  Normal  Neck:  Supple.  No adenopathy is present.   Lungs:  Clear to auscultation.  Breath sounds are equal.  Heart:  Regular rate and rhythm without murmurs, rubs, or gallops.  Abdomen:  Nontender without masses or hepatosplenomegaly.  Bowel sounds are present.  No CVA or flank tenderness.  Assessment New Problems: URI (ICD-465.9)  VIRAL URI  Plan New Medications/Changes: AZITHROMYCIN 250 MG TABS (AZITHROMYCIN) Two tabs by mouth on day 1, then 1 tab daily on days 2 through 5  #6 tabs x 0, 01/27/2010, Donna Christen MD BENZONATATE 200 MG CAPS (BENZONATATE) One by mouth hs as needed cough  #12 x 0, 01/27/2010, Donna Christen MD  New Orders: Est. Patient Level III [id number] Planning Comments:   Treat symptomatically for now:  expectorant, topical decongestant,  cough suppressant at night. Add Z-pack if not improving 5 to 7 days or persistent fever develops.   The patient and/or caregiver has been counseled thoroughly with regard to medications prescribed including dosage, schedule, interactions, rationale for use, and possible side effects and they verbalize understanding.  Diagnoses and expected course of recovery discussed and will return if not improved as expected or if the condition worsens. Patient and/or caregiver verbalized understanding.  Prescriptions: AZITHROMYCIN 250 MG TABS (AZITHROMYCIN) Two tabs by mouth on day 1, then 1 tab daily on days 2 through 5  #6 tabs x 0   Entered and Authorized by:   Donna Christen MD   Signed by:   Donna Christen MD on 01/27/2010   Method used:   Print then Give to Patient   RxID:   [id number] BENZONATATE 200 MG CAPS (BENZONATATE) One by mouth hs as needed cough  #12 x 0   Entered and Authorized by:   Donna Christen MD   Signed by:   Donna Christen MD on 01/27/2010   Method used:  Print then Give to Patient   RxID:   [id number]   Patient Instructions: 1)  May use Mucinex (guaifenesin)  twice daily for congestion. 2)  Increase fluid intake, rest. 3)  May use Afrin nasal spray (or generic oxymetazoline) twice daily for about 5 days.  Also recommend using saline nasal spray several times daily and/or saline nasal irrigation. 4)  Add azithromycin if not improving 5 to 7 days or persistent fever develops 5)  Followup with family doctor if not improving 7 to 10 days.

## 2011-01-22 NOTE — Consult Note (Signed)
Summary: [facility]  [facility]   Imported By: Lanelle Bal 01/10/2010 11:45:41  _____________________________________________________________________  External Attachment:    Type:   Image     Comment:   External Document

## 2011-01-22 NOTE — Progress Notes (Signed)
Summary: Lisinopril Refill  Phone Note Refill Request Message from:  Patient on November 29, 2010 11:24 AM  Refills Requested: Medication #1:  LISINOPRIL 5 MG TABS Take one tablet by mouth daily   Dosage confirmed as above?Dosage Confirmed   Brand Name Necessary? No   Supply Requested: 3 months   Last Refilled: [facility]/02/2010 [facility]     Method Requested: Electronic Next Appointment Scheduled: 02-12-11 Dr Artist Pais  Initial call taken by: Roselle Locus,  November 29, 2010 11:26 AM  Follow-up for Phone Call        Rx completed in Dr. Tiajuana Amass Follow-up by: Glendell Docker CMA,  November 29, 2010 11:31 AM    Prescriptions: LISINOPRIL 5 MG TABS (LISINOPRIL) Take one tablet by mouth daily  #90 x 1   Entered by:   Glendell Docker CMA   Authorized by:   D. Thomos Lemons DO   Signed by:   Glendell Docker CMA on 11/29/2010   Method used:   Electronically to        [facility]* (retail)       [address]       [address] [place] [address]       [place], [place]  [address]       Ph: [phone]       Fax: [phone]   RxID:   [id number]

## 2011-01-22 NOTE — Miscellaneous (Signed)
Summary: Orders Update  Clinical Lists Changes  Orders: Added new Test order of TLB-TSH (Thyroid Stimulating Hormone) (84443-TSH) - Signed 

## 2011-01-22 NOTE — Letter (Signed)
Summary: Health Screening Results  Health Screening Results   Imported By: Maryln Gottron 11/26/2010 10:05:28  _____________________________________________________________________  External Attachment:    Type:   Image     Comment:   External Document

## 2011-01-22 NOTE — Miscellaneous (Signed)
Summary: Orders Update  Clinical Lists Changes  Orders: Added new Test order of T-PSA [id number]) - Signed

## 2011-01-22 NOTE — Assessment & Plan Note (Signed)
Summary: follow up/mhf   Vital Signs:  Patient profile:   60 year old male Height:      68 inches Weight:      196.50 pounds BMI:     29.99 O2 Sat:      97 % on Room air Temp:     97.6 degrees F oral Pulse rate:   72 / minute Pulse rhythm:   regular Resp:     18 per minute BP sitting:   130 / 70  (right arm) Cuff size:   regular  Vitals Entered By: Glendell Docker CMA (August 09, 2010 4:00 PM)  O2 Flow:  Room air CC: Hospital Follow Up Is Patient Diabetic? No Pain Assessment Patient in pain? no      Comments wants to discuss medications, follow up with Dr Jens Som in September   Primary Care Tayari Yankee:  Algis Downs Thomos Lemons DO  CC:  Hospital Follow Up.  History of Present Illness: 60 y/o white male for f/u pt recently admitted for chest pain cardiac cath showed non obstructive CAD he still has intermittent symptoms ,  they improve with antacid use using some NSAIDs after hosp discharge  hosp records reviewed  Preventive Screening-Counseling & Management  Alcohol-Tobacco     Smoking Status: never  Allergies: 1)  ! 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   Neck:  supple, no masses, and no neck tenderness.   Lungs:  normal respiratory effort, normal  breath sounds, no crackles, and no wheezes.   Heart:  normal rate, regular rhythm, no murmur, and no gallop.   Extremities:  No lower extremity edema  Neurologic:  cranial nerves II-XII intact and gait normal.     Impression & Recommendations:  Problem # 1:  CHEST PAIN, ATYPICAL (ICD-786.59) Cardiac cath showed non obstructive CAD.  pain relieved with ant acids.  symptoms concerning for GI etiology / PUD.   stop all NSAIDs.  switch to zegerid.  antiflux handout provided.  Patient advised to call office if symptoms persist or worsen.  Orders Gastroenterology Referral (GI)  Problem # 2:  HYPERTENSION (ICD-401.9) Assessment: Unchanged  His updated medication list for this problem includes:    Lisinopril-hydrochlorothiazide 10-12.5 Mg Tabs (Lisinopril-hydrochlorothiazide) .Marland Kitchen... Take 1/2 tablet by mouth once a day    Metoprolol Succinate 50 Mg Xr24h-tab (Metoprolol succinate) ..... One by mouth once daily  BP today: 130/70 Prior BP: 130/100 (07/31/2010)  Labs Reviewed: K+: 4.5 (11/30/2009) Creat: : 1.18 (11/30/2009)   Chol: 155 (11/30/2009)   HDL: 35 (11/30/2009)   LDL: 94 (11/30/2009)   TG: 129 (11/30/2009)  Complete Medication List: 1)  Lisinopril-hydrochlorothiazide 10-12.5 Mg Tabs (Lisinopril-hydrochlorothiazide) .... Take 1/2 tablet by mouth once a day 2)  Omeprazole-sodium Bicarbonate 40-1100 Mg Caps (Omeprazole-sodium bicarbonate) .Marland KitchenMarland KitchenMarland Kitchen  One by mouth once daily 3)  Gnp Loratadine 10 Mg Tabs (Loratadine) .... Take 1 tablet by mouth once a day 4)  Pravastatin Sodium 40 Mg Tabs (Pravastatin sodium) .... One by mouth qpm 5)  Multivitamins Tabs (Multiple vitamin) .... Take 1 tablet by mouth once a day 6)  Fish Oil 1200 Mg Caps (Omega-3 fatty acids) .... 2 capsules by mouth once daily 7)  Levothyroxine Sodium 100 Mcg Tabs (Levothyroxine sodium) .... One by mouth once daily 8)  Aspirin 81 Mg Tbec (Aspirin) .... Take 1 tablet by mouth once a day 9)  Metoprolol Succinate 50 Mg Xr24h-tab  (Metoprolol succinate) .... One by mouth once daily  Patient Instructions: 1)  Please schedule a follow-up appointment in 2 months. Prescriptions: METOPROLOL SUCCINATE 50 MG XR24H-TAB (METOPROLOL SUCCINATE) one by mouth once daily  #90 x 1   Entered and Authorized by:   D. Thomos Lemons DO   Signed by:   D. Thomos Lemons DO on 08/09/2010   Method used:   Electronically to        [facility]* (retail)       [address]       [address]mail[address]       [place], [place]  [address]       Ph: [phone]       Fax: [phone]   RxID:   [id number] OMEPRAZOLE-SODIUM BICARBONATE 40-1100 MG CAPS (OMEPRAZOLE-SODIUM BICARBONATE) one by mouth once daily  #90 x 1   Entered and Authorized by:   D. Thomos Lemons DO   Signed by:   D. Thomos Lemons DO on 08/09/2010   Method used:   Electronically to        [facility]* (retail)       [address]       [address] Shipping[address]mail[address]       [place], [place]  [address]       Ph: [phone]       Fax: [phone]   RxID:   [id number] METOPROLOL SUCCINATE 50 MG XR24H-TAB (METOPROLOL SUCCINATE) one by mouth once daily  #30 x 3   Entered and Authorized by:   D. Thomos Lemons DO   Signed by:   D. Thomos Lemons DO on 08/09/2010   Method used:   Electronically to        [facility]* (retail)       [address]       [place], [place]  [address]       Ph: [phone] or [phone]       Fax: [phone]   RxID:   [id number]   Current Allergies (reviewed today): ! SIMVASTATIN

## 2011-01-22 NOTE — Assessment & Plan Note (Signed)
Summary: 4 month follow up/mhf   Vital Signs:  Patient profile:   60 year old male Weight:      196.50 pounds BMI:     29.99 O2 Sat:      97 % on Room air Temp:     97.8 degrees F oral Pulse rate:   63 / minute Pulse rhythm:   regular Resp:     18 per minute BP sitting:   110 / 80  (left arm) Cuff size:   large  Vitals Entered By: Glendell Docker CMA (June 11, 2010 3:30 PM)  O2 Flow:  Room air CC: Rm 2- 4 Month Follow  Is Patient Diabetic? No   Primary Care Provider:  DThomos Lemons DO  CC:  Rm 2- 4 Month Follow .  History of Present Illness: 60 y/o male c/o chronic fatigue able to fall asleep when not active amitriptyline not helpful for sleep wife notes snoring  Allergies: 1)  ! 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   Mouth:  pharyngeal crowding.   Lungs:  normal respiratory effort and normal breath sounds.   Heart:  normal rate, regular rhythm, and no gallop.   Extremities:  No lower extremity edema    Impression & Recommendations:  Problem # 1:  FATIGUE, CHRONIC (ICD-780.79) symptoms suggestive of OSA he defers sleep study for now I suggest wt loss.  He will ask his dentist about oral appliance  Problem # 2:  HYPOTHYROIDISM  (ICD-244.9) Maintain current medication regimen.  His updated medication list for this problem includes:    Levothyroxine Sodium 100 Mcg Tabs (Levothyroxine sodium) ..... One by mouth once daily  Labs Reviewed: TSH: 2.857 (06/07/2010)    Chol: 155 (11/30/2009)   HDL: 35 (11/30/2009)   LDL: 94 (11/30/2009)   TG: 129 (11/30/2009)  Problem # 3:  INSOMNIA, CHRONIC (ICD-307.42) no improvement with amitriptyline declines sedatives that have habit forming potential  Complete Medication List: 1)  Lisinopril-hydrochlorothiazide 10-12.5 Mg Tabs (Lisinopril-hydrochlorothiazide) .... Take 1/2 tablet by mouth once a day 2)  Omeprazole 20 Mg Cpdr (Omeprazole) .... Take 1 tablet by mouth once a day 3)  Gnp Loratadine 10 Mg Tabs (Loratadine) .... Take 1 tablet by mouth once a day 4)  Pravastatin Sodium 40 Mg Tabs (Pravastatin sodium) .... One by mouth qpm 5)  Multivitamins Tabs (Multiple vitamin) .... Take 1 tablet by mouth once a day 6)  Fish Oil 1200 Mg Caps (Omega-3 fatty acids) .... 2 capsules by mouth once daily 7)  Levothyroxine Sodium 100 Mcg Tabs (Levothyroxine sodium) .... One by mouth once daily 8)  Pennsaid 1.5 % Soln (Diclofenac sodium) .... Use three times a day  Patient Instructions: 1)  Please schedule a follow-up appointment in 6 months. 2)  TSH in 2 months -  244.9 Prescriptions: LEVOTHYROXINE SODIUM 100 MCG TABS (LEVOTHYROXINE SODIUM) one by mouth once daily  #90 x 1   Entered and Authorized by:   D. Thomos Lemons DO   Signed by:   D. Thomos Lemons DO on 06/11/2010   Method used:   Electronically to        [facility]* (retail)       [address]       [address] [place] [address]mail[address]       [place], [place]  [address]       Ph: [phone]       Fax: [phone]   RxID:   [id number]

## 2011-01-22 NOTE — Assessment & Plan Note (Signed)
Summary: flu?/mhf   Vital Signs:  Patient profile:   60 year old male Height:      68 inches Weight:      190.50 pounds BMI:     29.07 O2 Sat:      99 % on Room air Temp:     97.7 degrees F oral Pulse rate:   64 / minute BP sitting:   106 / 76  (right arm) Cuff size:   large  Vitals Entered By: Glendell Docker CMA (November 12, 2010 11:40 AM)  O2 Flow:  Room air CC: ?Flu Is Patient Diabetic? No Pain Assessment Patient in pain? no        Primary Care Provider:  Dondra Spry DO  CC:  ?Flu.  History of Present Illness: 60 y/o white male c/o sore throat, head congestion, calmmy skin, denies temp, pain with swallowing onset Friday, denies exposure to illness  recently had blood work at [facility] health screen notable for elevated PSA no BPH symptoms no dysuria  Preventive Screening-Counseling & Management  Alcohol-Tobacco     Smoking Status: never  Allergies: 1)  ! Simvastatin  Past History:  Past Medical History: HYPERTENSION HYPERLIPIDEMIA  FATIGUE, CHRONIC   PLANTAR FASCIITIS  INSOMNIA, CHRONIC HYPOTHYROIDISM  NEOPLASM OF UNCERTAIN BEHAVIOR OF SKIN  HEMORRHOIDS OSTEOARTHRITIS  HEARING DEFICIT PROTEINURIA  GERD DIVERTICULITIS, HX OF  Past Surgical History: herniated disc surgery 1978  Hemorrhoidectomy cyst removal           Family History: Family History of Alcoholism/Addiction Family History of Arthritis Family History of  breast cancer Family History of  prostate cancer  Family History of  CAD          Family History of Irritable Bowel Syndrome: Family History of Cystic Fibrosis:     Social History: Occupation: Engineer, 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   Mouth:  pharynx pink and moist.   Neck:  No deformities, masses, or  tenderness noted. Lungs:  normal respiratory effort and normal breath sounds.   Heart:  normal rate, regular rhythm, and no gallop.   Rectal:  external hemorrhoid(s).   Prostate:  no nodules, no asymmetry, and 1+ enlarged.     Impression & Recommendations:  Problem # 1:  URI (ICD-465.9)  His updated medication list for this problem includes:    Aspirin 81 Mg Tbec (Aspirin) .Marland Kitchen... Take 1 tablet by mouth once a day    Loratadine 10 Mg Tabs (Loratadine) .Marland Kitchen... Take 1 tablet by mouth once a day  Instructed on symptomatic treatment. Call if symptoms persist or worsen.   Problem # 2:  PROSTATE SPECIFIC ANTIGEN, ELEVATED (ICD-790.93) elevated PSA at recent health screening prostate is generous in size but no nodules or asymmetry  cipro x 10 days repeat PSA in 3 months  Complete Medication List: 1)  Lisinopril 5 Mg Tabs (Lisinopril) .... Take one tablet by mouth daily 2)  Omeprazole-sodium Bicarbonate 40-1100 Mg Caps (Omeprazole-sodium bicarbonate) .... One by mouth once daily 3)  Pravastatin Sodium 40 Mg Tabs (Pravastatin sodium) .... Take 1/2 tablet daily 4)  Multivitamins Tabs (Multiple vitamin) .... Take 1 tablet by mouth once a day 5)  Fish Oil 1200 Mg Caps (Omega-3 fatty acids) .... 2 capsules by mouth once daily 6)  Levothyroxine Sodium  100 Mcg Tabs (Levothyroxine sodium) .... One by mouth once daily 7)  Aspirin 81 Mg Tbec (Aspirin) .... Take 1 tablet by mouth once a day 8)  Metoprolol Succinate 50 Mg Xr24h-tab (Metoprolol succinate) .... One by mouth once daily 9)  Loratadine 10 Mg Tabs (Loratadine) .... Take 1 tablet by mouth once a day 10)  Nitrostat 0.4 Mg Subl (Nitroglycerin) .Marland Kitchen.. 1 tablet under tongue at onset of chest pain; you may repeat every 5 minutes for up to 3 doses. 11)  Ciprofloxacin Hcl 500 Mg Tabs (Ciprofloxacin hcl) .... One by mouth two times a day  Patient Instructions: 1)  Call our office if your symptoms do not  improve or gets worse. 2)  Please schedule a  follow-up appointment in 3 months. 3)  PSA prior to visit, ICD-9:  790.93 4)  Please return for lab work one (1) week before your next appointment.  Prescriptions: CIPROFLOXACIN HCL 500 MG TABS (CIPROFLOXACIN HCL) one by mouth two times a day  #20 x 0   Entered and Authorized by:   D. Thomos Lemons DO   Signed by:   D. Thomos Lemons DO on 11/12/2010   Method used:   Electronically to        [facility]* (retail)       [address]       [address] Shipping/mailing       [place], [place]  [address]       Ph: [phone]       Fax: [phone]   RxID:   [id number]    Orders Added: 1)  Est. Patient Level III [03474]    Current Allergies (reviewed today): ! SIMVASTATIN

## 2011-01-22 NOTE — Assessment & Plan Note (Signed)
Summary: [facility]   Visit Type:  Follow-up Primary Provider:  Dondra Spry DO  CC:  Chest pains.  History of Present Illness: 60 year old male recently admitted with chest pain. Enzymes negative. Cardiac catheterization on August 02, 2010 revealed a normal left main, mild plaque in the LAD, a 20% mid right coronary artery, a 30% circumflex, an ejection fraction was 55%. The aortic root angiogram showed possible mild dilatation of the aortic root but no large aneurysm. A d-dimer was normal. It was felt this chest pain may be reflux in etiology. Since discharge, he denies any dyspnea, palpitations or syncope. He continues to have lower chest and epigastric pain. It radiates to the mid abdomen. It is described as "sharp". There's no associated symptoms. It lasts between 5 and 10 minutes. He resolved spontaneously. It is not exertional, pleuritic or positional. It is not related to food.  Current Medications (verified): 1)  Lisinopril 5 Mg Tabs (Lisinopril) .... Take One Tablet By Mouth Daily 2)  Omeprazole-Sodium Bicarbonate 40-1100 Mg Caps (Omeprazole-Sodium Bicarbonate) .... One By Mouth Once Daily 3)  Pravastatin Sodium 40 Mg Tabs (Pravastatin Sodium) .... Take 1/2 Tablet Daily 4)  Multivitamins  Tabs (Multiple Vitamin) .... Take 1 Tablet By Mouth Once A Day 5)  Fish Oil 1200 Mg Caps (Omega-3 Fatty Acids) .... 2 Capsules By Mouth Once Daily 6)  Levothyroxine Sodium 100 Mcg Tabs (Levothyroxine Sodium) .... One By Mouth Once Daily 7)  Aspirin 81 Mg Tbec (Aspirin) .... Take 1 Tablet By Mouth Once A Day 8)  Metoprolol Succinate 50 Mg Xr24h-Tab (Metoprolol Succinate) .... One By Mouth Once Daily 9)  Loratadine 10 Mg Tabs (Loratadine) .... Take 1 Tablet By Mouth Once A Day 10)  Nitrostat 0.4 Mg Subl (Nitroglycerin) .Marland Kitchen.. 1 Tablet Under Tongue At Onset of Chest Pain; You May Repeat Every 5 Minutes For Up To 3 Doses.  Allergies: 1)  ! Simvastatin  Past History:  Past Medical  History: HYPERTENSION HYPERLIPIDEMIA  FATIGUE, CHRONIC  PLANTAR FASCIITIS  INSOMNIA, CHRONIC HYPOTHYROIDISM  NEOPLASM OF UNCERTAIN BEHAVIOR OF SKIN  HEMORRHOIDS OSTEOARTHRITIS  HEARING DEFICIT PROTEINURIA  GERD DIVERTICULITIS, HX OF  Past Surgical History: Reviewed history from 08/09/2010 and no changes required. herniated disc surgery 1978 Hemorrhoidectomy cyst removal           Social History: Reviewed history from 08/09/2010 and no changes required. 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 Remaining systems are negative.   Vital Signs:  Patient profile:   60 year old male Height:      68 inches Weight:      194 pounds BMI:     29.60 Pulse rate:   58 / minute Pulse rhythm:   regular Resp:     18 per minute BP sitting:   110 / 80  (left arm) Cuff size:   large  Vitals Entered By: Vikki Ports (September 05, 2010 10:53 AM)  Physical Exam  General:  Well-developed well-nourished in no acute distress.  Skin is warm and dry.  HEENT is normal.  Neck is supple. No thyromegaly.  Chest is clear to auscultation with normal expansion.  Cardiovascular exam is regular rate and rhythm.  Abdominal  exam shows mild tenderness to deep palpation in the epigastric area. No masses palpated. Extremities show no edema. neuro grossly intact    EKG  Procedure date:  09/05/2010  Findings:      Normal sinus rhythm at a rate of 58. Axis normal. No ST changes.  Impression & Recommendations:  Problem # 1:  CHEST PAIN, ATYPICAL (ICD-786.59) Symptoms atypical. Catheterization revealed no obstructive coronary disease. D-dimer was  normal. Aortogram apparently showed no dissection. No further workup at this point. Note EKG normal. He is scheduled to see a gastroenterologist in early October which I think is appropriate. His updated medication list for this problem includes:    Lisinopril 5 Mg Tabs (Lisinopril) .Marland Kitchen... Take one tablet by mouth daily    Aspirin 81 Mg Tbec (Aspirin) .Marland Kitchen... Take 1 tablet by mouth once a day    Metoprolol Succinate 50 Mg Xr24h-tab (Metoprolol succinate) ..... One by mouth once daily    Nitrostat 0.4 Mg Subl (Nitroglycerin) .Marland Kitchen... 1 tablet under tongue at onset of chest pain; you may repeat every 5 minutes for up to 3 doses.  Problem # 2:  HYPERTENSION (ICD-401.9) Blood pressure controlled on present medications. Will continue. His updated medication list for this problem includes:    Lisinopril 5 Mg Tabs (Lisinopril) .Marland Kitchen... Take one tablet by mouth daily    Aspirin 81 Mg Tbec (Aspirin) .Marland Kitchen... Take 1 tablet by mouth once a day    Metoprolol Succinate 50 Mg Xr24h-tab (Metoprolol succinate) ..... One by mouth once daily  Problem # 3:  HYPERLIPIDEMIA (ICD-272.4) Continue statin. Lipid and liver monitored by primary care. His updated medication list for this problem includes:    Pravastatin Sodium 40 Mg Tabs (Pravastatin sodium) .Marland Kitchen... Take 1/2 tablet daily  Problem # 4:  HYPOTHYROIDISM (ICD-244.9)  His updated medication list for this problem includes:    Levothyroxine Sodium 100 Mcg Tabs (Levothyroxine sodium) ..... One by mouth once daily  Problem # 5:  GERD (ICD-530.81)  His updated medication list for this problem includes:    Omeprazole-sodium Bicarbonate 40-1100 Mg Caps (Omeprazole-sodium bicarbonate) ..... One by mouth once daily

## 2011-01-22 NOTE — Assessment & Plan Note (Signed)
Summary: NP PLANTAR FASCIITIS/MJD   Vital Signs:  Patient profile:   60 year old male Height:      68 inches Weight:      194 pounds Pulse rate:   68 / minute BP sitting:   143 / 85  Primary Care Provider:  Dondra Spry DO   History of Present Illness: 60 yo M here for right foot pain  Patient states symptoms date back to February 2011 Remembers running around [facility] when felt a pull at plantar aspect of heel within plantar fascia. No bruising or swelling at that time. Was seen 2 months later by [facility] - given heel cups and shown basic stretches He did these for a little bit and improved some but pain recurred. 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 Not tried meds or icing Pain worse in morning and after prolonged sitting.  Problems Prior to Update: 1)  Chest Pain, Atypical  (ICD-786.59) 2)  Chest Pain, Acute  (ICD-786.50) 3)  Hypertension  (ICD-401.9) 4)  Hyperlipidemia  (ICD-272.4) 5)  Fatigue, Chronic  (ICD-780.79) 6)  Plantar Fasciitis  (ICD-728.71) 7)  Insomnia, Chronic  (ICD-307.42) 8)  Uri  (ICD-465.9) 9)  Ear Pain, Right  (ICD-388.70) 10)  Hypothyroidism  (ICD-244.9) 11)  Ingrown Nail  (ICD-703.0) 12)  Encounter For Long-term Use of Other Medications  (ICD-V58.69) 13)  Neoplasm of Uncertain Behavior of Skin  (ICD-238.2) 14)  Hemorrhoids  (ICD-455.6) 15)  Osteoarthritis  (ICD-715.90) 16)  Allergic Rhinitis  (ICD-477.9) 17)  Hearing Deficit  (ICD-389.9) 18)  Proteinuria  (ICD-791.0) 19)  Family History of Alcoholism/addiction  (ICD-V61.41) 20)  Gerd  (ICD-530.81) 21)  Diverticulitis, Hx of  (ICD-V12.79)  Medications Prior to Update: 1)  Lisinopril 5 Mg Tabs (Lisinopril) .... Take One Tablet By Mouth Daily 2)  Omeprazole-Sodium Bicarbonate 40-1100 Mg Caps (Omeprazole-Sodium Bicarbonate) .... One By Mouth Once Daily 3)  Pravastatin Sodium 40 Mg Tabs (Pravastatin Sodium) ....  Take 1/2 Tablet Daily 4)  Multivitamins  Tabs (Multiple Vitamin) .... Take 1 Tablet By Mouth Once A Day 5)  Fish Oil 1200 Mg Caps (Omega-3 Fatty Acids) .... 2 Capsules By Mouth Once Daily 6)  Levothyroxine Sodium 100 Mcg Tabs (Levothyroxine Sodium) .... One By Mouth Once Daily 7)  Aspirin 81 Mg Tbec (Aspirin) .... Take 1 Tablet By Mouth Once A Day 8)  Metoprolol Succinate 50 Mg Xr24h-Tab (Metoprolol Succinate) .... One By Mouth Once Daily 9)  Loratadine 10 Mg Tabs (Loratadine) .... Take 1 Tablet By Mouth Once A Day 10)  Nitrostat 0.4 Mg Subl (Nitroglycerin) .Marland Kitchen.. 1 Tablet Under Tongue At Onset of Chest Pain; You May Repeat Every 5 Minutes For Up To 3 Doses.  Allergies (verified): 1)  ! Simvastatin  Physical Exam  General:  alert, well-developed, and well-nourished.   Msk:  R foot: Cavus deformity compared to left (left more collapsed).  No leg length inequality. Mild transverse collapse but no tenderness on metatarsal heads. TTP anterior calcaneus plantar aspect, less TTP within plantar fascia.  No TTP achilles or elsewhere about foot or ankle.   Impression & Recommendations:  Problem # 1:  PLANTAR FASCIITIS (ICD-728.71) Assessment Deteriorated  Shown eccentric exercise program to do daily on a step in addition to plantar fascia stretch.  Arch strap given and felt comfortable.  Discussed he needs arch supports (offered comforthotics here with scaphoid pad but he has some arch  support at home he will slip into his work shoes.  Offered injection, custom orthotics if does not improve with these standard therapies after 6 weeks.  Ice bucket.  F/u in 6 weeks for recheck or sooner if wants injection, comforthotics with scaphoid pads.  Orders: Foot Orthosis ( Arch Strap/Heel Cup) [phone])  Complete Medication List: 1)  Lisinopril 5 Mg Tabs (Lisinopril) .... Take one tablet by mouth daily 2)  Omeprazole-sodium Bicarbonate 40-1100 Mg Caps (Omeprazole-sodium bicarbonate) .... One by mouth once  daily 3)  Pravastatin Sodium 40 Mg Tabs (Pravastatin sodium) .... Take 1/2 tablet daily 4)  Multivitamins Tabs (Multiple vitamin) .... Take 1 tablet by mouth once a day 5)  Fish Oil 1200 Mg Caps (Omega-3 fatty acids) .... 2 capsules by mouth once daily 6)  Levothyroxine Sodium 100 Mcg Tabs (Levothyroxine sodium) .... One by mouth once daily 7)  Aspirin 81 Mg Tbec (Aspirin) .... Take 1 tablet by mouth once a day 8)  Metoprolol Succinate 50 Mg Xr24h-tab (Metoprolol succinate) .... One by mouth once daily 9)  Loratadine 10 Mg Tabs (Loratadine) .... Take 1 tablet by mouth once a day 10)  Nitrostat 0.4 Mg Subl (Nitroglycerin) .Marland Kitchen.. 1 tablet under tongue at onset of chest pain; you may repeat every 5 minutes for up to 3 doses.  Patient Instructions: 1)  Take tylenol or aleve as needed for pain  2)  Plantar fascia stretch for 20-30 seconds in morning 3 times 3)  Lowering/raise on a step exercises 3 x 15 once or twice a day 4)  Can add heel walks, toe walks forward and backward as well 5)  Ice bucket 10-15 minutes at end of day 6)  Avoid flat shoes/barefoot walking when possible 7)  Arch straps will help take pressure off this area. 8)  You need some type of insert that has arch support to distribute the force over a wider area and allow this to heal. 9)  A shot in the plantar fascia can help with short term pain relief. 10)  Custom orthotics are another option that has been shown to help with this. 11)  Follow up with me in 6 weeks for a recheck (or sooner if you want to pursue those other options earlier).

## 2011-01-22 NOTE — Assessment & Plan Note (Signed)
Summary: SCREEN FOR ENDO/DX ATYPICAL CHEST PAIN/YF   History of Present Illness Visit Type: Initial Consult Primary GI MD: Yancey Flemings MD Primary Provider: Dondra Spry DO History of Present Illness:   60 year old white male with hypertension, hyperlipidemia, chronic fatigue, hypothyroidism, and GERD. Sent today regarding noncardiac chest pain. The patient's history dates back to early August when he developed focal lower sternal pain which she described as sharp and constant. He was admitted to the hospital and underwent cardiac catheterization. He was found to have nonobstructive coronary artery disease. He has been seen in followup by cardiology and his pain felt to be noncardiac. He has since had 12-15 episodes of pain, most recently yesterday. Discomfort can last anywhere from 10 minutes to one hour and is not associated with meals, or time of day, activity, or stress. She does have a history of GERD, but symptoms are well controlled with omeprazole. Prior upper endoscopy in [facility] [place] was negative in 2007. He was recently changed from Prilosec to Zegerid without change. The pain is focal and nonradiating. No prior abdominal surgery. He does mention that when he has the pain, but taking deep breaths makes it hurt more, though he denies shortness of breath. Incidentally, he reports colonoscopy in 2007 without polyps.   GI Review of Systems    Reports acid reflux and  chest pain.      Denies abdominal pain, belching, bloating, dysphagia with liquids, dysphagia with solids, heartburn, loss of appetite, nausea, vomiting, vomiting blood, weight loss, and  weight gain.      Reports diverticulosis and  hemorrhoids.     Denies anal fissure, black tarry stools, change in bowel habit, constipation, diarrhea, fecal incontinence, heme positive stool, irritable bowel syndrome, jaundice, light color stool, liver problems, rectal bleeding, and  rectal pain. Preventive Screening-Counseling &  Management      Drug Use:  no.      Current Medications (verified): 1)  Lisinopril 5 Mg Tabs (Lisinopril) .... Take One Tablet By Mouth Daily 2)  Omeprazole-Sodium Bicarbonate 40-1100 Mg Caps (Omeprazole-Sodium Bicarbonate) .... One By Mouth Once Daily 3)  Pravastatin Sodium 40 Mg Tabs (Pravastatin Sodium) .... Take 1/2 Tablet Daily 4)  Multivitamins  Tabs (Multiple Vitamin) .... Take 1 Tablet By Mouth Once A Day 5)  Fish Oil 1200 Mg Caps (Omega-3 Fatty Acids) .... 2 Capsules By Mouth Once Daily 6)  Levothyroxine Sodium 100 Mcg Tabs (Levothyroxine Sodium) .... One By Mouth Once Daily 7)  Aspirin 81 Mg Tbec (Aspirin) .... Take 1 Tablet By Mouth Once A Day 8)  Metoprolol Succinate 50 Mg Xr24h-Tab (Metoprolol Succinate) .... One By Mouth Once Daily 9)  Loratadine 10 Mg Tabs (Loratadine) .... Take 1 Tablet By Mouth Once A Day 10)  Nitrostat 0.4 Mg Subl (Nitroglycerin) .Marland Kitchen.. 1 Tablet Under Tongue At Onset of Chest Pain; You May Repeat Every 5 Minutes For Up To 3 Doses.  Allergies (verified): 1)  ! Simvastatin  Past History:  Past Medical History: Reviewed history from 09/05/2010 and no changes required. HYPERTENSION HYPERLIPIDEMIA  FATIGUE, CHRONIC  PLANTAR FASCIITIS  INSOMNIA, CHRONIC HYPOTHYROIDISM  NEOPLASM OF UNCERTAIN BEHAVIOR OF SKIN  HEMORRHOIDS OSTEOARTHRITIS  HEARING DEFICIT PROTEINURIA  GERD DIVERTICULITIS, HX OF  Past Surgical History: Reviewed history from 08/09/2010 and no changes required. herniated disc surgery 1978 Hemorrhoidectomy cyst removal           Family History: Reviewed history from 09/04/2010 and no changes required. Family History of Alcoholism/Addiction Family History of Arthritis Family  History of  breast cancer Family History of  prostate cancer  Family History of  CAD          Family History of Irritable Bowel Syndrome: Family History of Cystic Fibrosis:   Social History: Reviewed history from 08/09/2010 and no changes  required. 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  The patient denies anemia, anxiety-new, arthritis/joint pain, blood in urine, breast changes/lumps, change in vision, confusion, cough, coughing up blood, depression-new, fainting, fatigue, fever, headaches-new, heart murmur, heart rhythm changes, itching, muscle pains/cramps, night sweats, nosebleeds, skin rash, sore throat, swelling of feet/legs, swollen lymph glands, thirst - excessive, urination - excessive, urination changes/pain, urine leakage, vision changes, and voice change.    Vital Signs:  Patient profile:   60 year old male Height:      68 inches Weight:      193 pounds BMI:     29.45 Pulse rate:   72 / minute Pulse rhythm:   regular BP sitting:   106 / 68  (left arm) Cuff size:   regular  Vitals Entered By: Milford Cage NCMA (September 25, 2010 3:08 PM)  Physical Exam  General:  Well developed, well nourished, no acute distress. Head:  Normocephalic and atraumatic. Eyes:  PERRLA, no icterus. Ears:  Normal auditory acuity. Nose:  No deformity, discharge,  or lesions. Mouth:  No deformity or lesions, dentition normal. Neck:  Supple; no masses or thyromegaly. Lungs:  Clear throughout to auscultation. Heart:  Regular rate and rhythm; no murmurs, rubs,  or bruits. Abdomen:  Soft, nontender and nondistended. No masses, hepatosplenomegaly or hernias noted. Normal bowel sounds. Msk:  Symmetrical with no gross deformities. Normal posture. Pulses:  Normal pulses noted. Extremities:  No clubbing, cyanosis, edema or deformities noted. Neurologic:  Alert and  oriented x4;  grossly normal neurologically. Skin:  Intact without significant lesions or  rashes. Psych:  Alert and cooperative. Normal mood and affect.   Impression & Recommendations:  Problem # 1:  CHEST PAIN, ATYPICAL (ICD-786.59) atypical chest pain, with negative cardiac workup, as described. Features equally atypical for primary GI cause. Not suspect GI cause based on history, failure to respond to PPI, and known prior normal endoscopy. Suspect that his pain is musculoskeletal in nature based on description. Atypical biliary colic less likely.  Plan:  #1. Abdominal ultrasound #2. If negative, return to primary provider  Problem # 2:  GERD (ICD-530.81) classic GERD controlled with PPI  Plan: #1. Reflux precautions #2. Close dose at least expensive PPI to control symptoms #3. Request for outside endoscopy report  Problem # 3:  SCREENING COLORECTAL-CANCER (ICD-V76.51) baseline risk. Apparently negative for neoplasia on index exam in 2007. Requested outside report. Assuming negative, would be due for routine followup 2017  Other Orders: Ultrasound Abdomen (UAS)  Patient Instructions: 1)  Abdominal Ultrasound [facility] 09/26/10 9:30 am arrive at 9:15 am 2)  Nothing to eat or drink 6 hours prior  3)  Copy sent to : D. Thomos Lemons DO 4)  The medication list was reviewed and reconciled.  All changed / newly prescribed medications were explained.  A complete medication list was provided to the patient / caregiver.

## 2011-01-22 NOTE — Assessment & Plan Note (Signed)
Summary: Cough-dry/productive, sinus pressure x 1 wk rm 5   Vital Signs:  Patient Profile:   60 Years Old Male CC:      Cold & URI symptoms Height:     68 inches (172.72 cm) Weight:      191 pounds O2 Sat:      94 % O2 treatment:    Room Air Temp:     98.4 degrees F oral Pulse rate:   67 / minute Pulse rhythm:   regular Resp:     14 per minute BP sitting:   114 / 75  (left arm) Cuff size:   regular  Vitals Entered By: Areta Haber CMA (November 16, 2010 3:45 PM)                  Current Allergies: ! SIMVASTATIN     History of Present Illness Chief Complaint: Cold & URI symptoms History of Present Illness:  Subjective: Patient complains of developing cold-like symptoms about a week ago with headache and sinus congestion.  He then developed a non-productive cough that has persisted and is worse at night.  He began developing chills last night. No pleuritic pain No wheezing + post-nasal drainage + mild right sinus pain/pressure No itchy/red eyes No earache No hemoptysis No SOB No nausea No vomiting No abdominal pain No diarrhea No skin rashes + fatigue No myalgias Used OTC meds without relief   Current Problems: ACUTE MAXILLARY SINUSITIS (ICD-461.0) URI (ICD-465.9) PROSTATE SPECIFIC ANTIGEN, ELEVATED (ICD-790.93) SCREENING COLORECTAL-CANCER (ICD-V76.51) HYPERTENSION (ICD-401.9) HYPERLIPIDEMIA (ICD-272.4) FATIGUE, CHRONIC (ICD-780.79) PLANTAR FASCIITIS (ICD-728.71) INSOMNIA, CHRONIC (ICD-307.42) HYPOTHYROIDISM (ICD-244.9) ENCOUNTER FOR LONG-TERM USE OF OTHER MEDICATIONS (ICD-V58.69) NEOPLASM OF UNCERTAIN BEHAVIOR OF SKIN (ICD-238.2) HEMORRHOIDS (ICD-455.6) OSTEOARTHRITIS (ICD-715.90) ALLERGIC RHINITIS (ICD-477.9) HEARING DEFICIT (ICD-389.9) PROTEINURIA (ICD-791.0) FAMILY HISTORY OF ALCOHOLISM/ADDICTION (ICD-V61.41) GERD (ICD-530.81) DIVERTICULITIS, HX OF (ICD-V12.79)   Current Meds LISINOPRIL 5 MG TABS (LISINOPRIL) Take one tablet by mouth  daily OMEPRAZOLE-SODIUM BICARBONATE 40-1100 MG CAPS (OMEPRAZOLE-SODIUM BICARBONATE) one by mouth once daily PRAVASTATIN SODIUM 40 MG TABS (PRAVASTATIN SODIUM) take 1/2 tablet daily MULTIVITAMINS  TABS (MULTIPLE VITAMIN) Take 1 tablet by mouth once a day FISH OIL 1200 MG CAPS (OMEGA-3 FATTY ACIDS) 2 capsules by mouth once daily LEVOTHYROXINE SODIUM 100 MCG TABS (LEVOTHYROXINE SODIUM) one by mouth once daily ASPIRIN 81 MG TBEC (ASPIRIN) Take 1 tablet by mouth once a day METOPROLOL SUCCINATE 50 MG XR24H-TAB (METOPROLOL SUCCINATE) one by mouth once daily LORATADINE 10 MG TABS (LORATADINE) Take 1 tablet by mouth once a day NITROSTAT 0.4 MG SUBL (NITROGLYCERIN) 1 tablet under tongue at onset of chest pain; you may repeat every 5 minutes for up to 3 doses. CIPROFLOXACIN HCL 500 MG TABS (CIPROFLOXACIN HCL) one by mouth two times a day DELSYM 30 MG/5ML LQCR (DEXTROMETHORPHAN POLISTIREX) as directed DAYQUIL MULTI-SYMPTOM 30-325-10 MG/15ML LIQD (PSEUDOEPHEDRINE-APAP-DM) as directed VICKS NYQUIL MULTI-SYMPTOM 15-6.25-325 MG CAPS (DM-DOXYLAMINE-ACETAMINOPHEN) as directed MUCINEX 600 MG XR12H-TAB (GUAIFENESIN) as directed AZITHROMYCIN 250 MG TABS (AZITHROMYCIN) Two tabs by mouth on day 1, then 1 tab daily on days 2 through 5 BENZONATATE 200 MG CAPS (BENZONATATE) One by mouth hs as needed cough  REVIEW OF SYSTEMS Constitutional Symptoms      Denies fever, chills, night sweats, weight loss, weight gain, and fatigue.  Eyes       Denies change in vision, eye pain, eye discharge, glasses, contact lenses, and eye surgery. Ear/Nose/Throat/Mouth       Complains of sinus problems and sore throat.      Denies hearing loss/aids, change  in hearing, ear pain, ear discharge, dizziness, frequent runny nose, frequent nose bleeds, hoarseness, and tooth pain or bleeding.      Comments: ST irrittated by coughx 1wk Respiratory       Complains of dry cough and productive cough.      Denies wheezing, shortness of breath,  asthma, bronchitis, and emphysema/COPD.  Cardiovascular       Denies murmurs, chest pain, and tires easily with exhertion.    Gastrointestinal       Denies stomach pain, nausea/vomiting, diarrhea, constipation, blood in bowel movements, and indigestion. Genitourniary       Denies painful urination, kidney stones, and loss of urinary control. Neurological       Denies paralysis, seizures, and fainting/blackouts. Musculoskeletal       Denies muscle pain, joint pain, joint stiffness, decreased range of motion, redness, swelling, muscle weakness, and gout.  Skin       Denies bruising, unusual mles/lumps or sores, and hair/skin or nail changes.  Psych       Denies mood changes, temper/anger issues, anxiety/stress, speech problems, depression, and sleep problems. Other Comments: Pt states he saw his PCP, 11/12/10, thought prescription for Cipro was for prostate, has not had it filled yet. Pt's wife states she has given him some of her lefted over Hycodan cough syrup as well to help with the coughing.   Past History:  Past Medical History: Last updated: 11/12/2010 HYPERTENSION HYPERLIPIDEMIA  FATIGUE, CHRONIC   PLANTAR FASCIITIS  INSOMNIA, CHRONIC HYPOTHYROIDISM  NEOPLASM OF UNCERTAIN BEHAVIOR OF SKIN  HEMORRHOIDS OSTEOARTHRITIS  HEARING DEFICIT PROTEINURIA  GERD DIVERTICULITIS, HX OF  Past Surgical History: Last updated: 11/12/2010 herniated disc surgery 1978  Hemorrhoidectomy cyst removal           Family History: Last updated: 11/12/2010 Family History of Alcoholism/Addiction Family History of Arthritis Family History of  breast cancer Family History of  prostate cancer  Family History of  CAD          Family History of Irritable Bowel Syndrome: Family History of Cystic Fibrosis:     Social History: Last updated: 11/12/2010 Occupation: Engineer, materials at [employer] Married Automotive engineer) 2 children   Never Smoked    Alcohol use-yes    Previously   Camera operator - [employer] Illicit Drug Use - no  Risk Factors: Caffeine Use: 4 - cut back on servings (02/08/2009) Exercise: yes (02/08/2009)  Risk Factors: Smoking Status: never (11/12/2010) Passive Smoke Exposure: yes (02/08/2009)   Objective:  No acute distress  Eyes:  Pupils are equal, round, and reactive to light and accomdation.  Extraocular movement is intact.  Conjunctivae are not inflamed.  Ears:  Canals normal.  Tympanic membranes normal.   Nose:   Congested turbinates, worse on right.  Right maxillary sinus tenderness present.  Mouth:  moist mucous membranes  Pharynx:  Normal  Neck:  Supple.  No adenopathy is present.  Lungs:  Clear to auscultation.  Breath sounds are equal.  Heart:  Regular rate and rhythm without murmurs, rubs, or gallops.  Abdomen:  Nontender without masses or hepatosplenomegaly.  Bowel sounds are present.  No CVA or flank tenderness.  Extremities:  No edema.   Skin:  No rash Assessment  Assessed URI as unchanged - Donna Christen MD New Problems: ACUTE MAXILLARY SINUSITIS (ICD-461.0)   Plan New Medications/Changes: BENZONATATE 200 MG CAPS (BENZONATATE) One by mouth hs as needed cough  #12 x 0, 11/16/2010, Donna Christen MD AZITHROMYCIN 250 MG TABS (AZITHROMYCIN)  Two tabs by mouth on day 1, then 1 tab daily on days 2 through 5  #6 tabs x 0, 11/16/2010, Donna Christen MD  New Orders: Pulse Oximetry (single measurment) [94760] Est. Patient Level IV [62831] Planning Comments:   Begin Z-pack, expectorant, topical decongestant,  cough suppressant at bedtime.  Increase fluid intake.  Stop loratidine for now. Followup with PCP if not improving 7 to 10 days   The patient and/or caregiver has been counseled thoroughly with regard to medications prescribed including dosage, schedule, interactions, rationale for use, and possible side effects and they verbalize understanding.  Diagnoses and expected course of recovery discussed and will  return if not improved as expected or if the condition worsens. Patient and/or caregiver verbalized understanding.  Prescriptions: BENZONATATE 200 MG CAPS (BENZONATATE) One by mouth hs as needed cough  #12 x 0   Entered and Authorized by:   Donna Christen MD   Signed by:   Donna Christen MD on 11/16/2010   Method used:   Print then Give to Patient   RxID:   [id number] AZITHROMYCIN 250 MG TABS (AZITHROMYCIN) Two tabs by mouth on day 1, then 1 tab daily on days 2 through 5  #6 tabs x 0   Entered and Authorized by:   Donna Christen MD   Signed by:   Donna Christen MD on 11/16/2010   Method used:   Print then Give to Patient   RxID:   [id number]   Patient Instructions: 1)  May use Mucinex (guaifenesin) twice daily for congestion. 2)  Increase fluid intake, rest. 3)  Stop loratidine for now. 4)  May use Afrin nasal spray (or generic oxymetazoline) twice daily for about 5 days.  Also recommend using saline nasal spray several times daily and/or saline nasal irrigation. 5)  Followup with family doctor if not improving 7 to 10 days.  Orders Added: 1)  Pulse Oximetry (single measurment) [94760] 2)  Est. Patient Level IV [82993]

## 2011-01-23 ENCOUNTER — Encounter: Payer: Self-pay | Admitting: Rehabilitation

## 2011-01-24 NOTE — Progress Notes (Signed)
Summary: Levothyroxine Refill  Phone Note Refill Request Message from:  Patient on December 10, 2010 10:48 AM  Refills Requested: Medication #1:  LEVOTHYROXINE SODIUM 100 MCG TABS one by mouth once daily   Dosage confirmed as above?Dosage Confirmed   Brand Name Necessary? No   Supply Requested: 3 months [facility] [phone]   Method Requested: Electronic Next Appointment Scheduled: 02-12-11 DR Artist Pais  Initial call taken by: Roselle Locus,  December 10, 2010 10:49 AM  Follow-up for Phone Call        ok for refil x 5 Follow-up by: D. Thomos Lemons DO,  December 10, 2010 3:37 PM  Additional Follow-up for Phone Call Additional follow up Details #1::        Rx sent electronically to pharmacy Additional Follow-up by: Glendell Docker CMA,  December 10, 2010 4:42 PM    Prescriptions: LEVOTHYROXINE SODIUM 100 MCG TABS (LEVOTHYROXINE SODIUM) one by mouth once daily  #90 x 1   Entered by:   Glendell Docker CMA   Authorized by:   D. Thomos Lemons DO   Signed by:   Glendell Docker CMA on 12/10/2010   Method used:   Electronically to        [facility]* (retail)       [address]       [address] [place]       [place], [place]  [address]       Ph: [phone]       Fax: [phone]   RxID:   [id number]

## 2011-01-24 NOTE — Progress Notes (Signed)
Summary: wants to stop metoprolol  Phone Note Call from Patient Call back at Home Phone [phone]   Caller: Patient Call For: Zachary [name]  Summary of Call: Sedrick wants to stop taking the metoprolol.  He is experiencing mood swings.   Initial call taken by: Roselle Locus,  December 14, 2010 10:05 AM  Follow-up for Phone Call        I suggest pt switch to bisoprolol see new rx  reassess at next OV Follow-up by: D. Thomos Lemons DO,  December 14, 2010 2:38 PM  Additional Follow-up for Phone Call Additional follow up Details #1::        call returned to patient at [phone], he has been informed per Dr Artist Pais instructions Additional Follow-up by: Glendell Docker CMA,  December 14, 2010 2:54 PM    New/Updated Medications: BISOPROLOL FUMARATE 5 MG TABS (BISOPROLOL FUMARATE) 1/2 tab by mouth once daily Prescriptions: BISOPROLOL FUMARATE 5 MG TABS (BISOPROLOL FUMARATE) 1/2 tab by mouth once daily  #30 x 1   Entered and Authorized by:   D. Thomos Lemons DO   Signed by:   D. Thomos Lemons DO on 12/14/2010   Method used:   Electronically to        [id number]* (retail)       [address]       [place], [place]  [address]       Ph: [phone] or [phone]       Fax: [phone]   RxID:   [id number]

## 2011-01-24 NOTE — Assessment & Plan Note (Signed)
Summary: RIGHT ELBOW PAIN/LP   Vital Signs:  Patient profile:   59 year old male Pulse rate:   63 / minute BP sitting:   144 / 76  Primary Care Provider:  Dondra Spry DO   History of Present Illness: 60 yo M here for right elbow pain  Patient reports no known injury Pain started about 1-2 weeks ago and slowly worsened over that time No swelling or bruising Is right handed Does a good amount of lifting at work - no writing/computer No prior elbow injuries Tried ibuprofen and band over elbow just distal to elbow Tried home grip exercises and stretches  Allergies: 1)  ! Simvastatin  Physical Exam  General:  alert, well-developed, and well-nourished.   Msk:  R elbow: No gross deformity, swelling, or bruising. TTP at lateral epicondyle, less in extensor mass at elbow.  No other TTP. FROM of elbow and wrist. Collateral ligaments intact. Pain reproduced with resisted 3rd finger and wrist extension. NVI distally.   Impression & Recommendations:  Problem # 1:  LATERAL EPICONDYLITIS, RIGHT (ICD-726.32) Assessment New  Classic lateral epicondylitis of right elbow.  Home exercises shown, go to PT for ionto and modalities.  Aleve, icing to help with pain control.  Wrist brace to prevent repetitive wrist extension while at work.  Counterforce brace hurts worse so will discontinue use (was using in the proper location).  Consider injection if not improving over the next few weeks.  See instructions for further.  Orders: Brace Wrist [phone])  Problem # 2:  ELBOW PAIN, RIGHT (ICD-719.42) Assessment: New  Orders: Brace Wrist ([id number])  Problem # 3:  WRIST PAIN, RIGHT (ICD-719.43)  pain with wrist extension, finger extension 2/2 tennis elbow.  Orders: Brace Wrist ([id number])  Complete Medication List: 1)  Lisinopril 5 Mg Tabs (Lisinopril) .... Take one tablet by mouth daily 2)  Omeprazole-sodium Bicarbonate 40-1100 Mg Caps (Omeprazole-sodium bicarbonate) .... One by mouth once  daily 3)  Pravastatin Sodium 40 Mg Tabs (Pravastatin sodium) .... Take 1/2 tablet daily 4)  Multivitamins Tabs (Multiple vitamin) .... Take 1 tablet by mouth once a day 5)  Fish Oil 1200 Mg Caps (Omega-3 fatty acids) .... 2 capsules by mouth once daily 6)  Levothyroxine Sodium 100 Mcg Tabs (Levothyroxine sodium) .... One by mouth once daily 7)  Aspirin 81 Mg Tbec (Aspirin) .... Take 1 tablet by mouth once a day 8)  Loratadine 10 Mg Tabs (Loratadine) .... Take 1 tablet by mouth once a day 9)  Nitrostat 0.4 Mg Subl (Nitroglycerin) .Marland Kitchen.. 1 tablet under tongue at onset of chest pain; you may repeat every 5 minutes for up to 3 doses. 10)  Ciprofloxacin Hcl 500 Mg Tabs (Ciprofloxacin hcl) .... One by mouth two times a day 11)  Delsym 30 Mg/52ml Lqcr (Dextromethorphan polistirex) .... As directed 12)  Dayquil Multi-symptom 30-325-10 Mg/79ml Liqd (Pseudoephedrine-apap-dm) .... As directed 13)  Vicks Nyquil Multi-symptom 15-6.25-325 Mg Caps (Dm-doxylamine-acetaminophen) .... As directed 14)  Mucinex 600 Mg Xr12h-tab (Guaifenesin) .... As directed 15)  Azithromycin 250 Mg Tabs (Azithromycin) .... Two tabs by mouth on day 1, then 1 tab daily on days 2 through 5 16)  Benzonatate 200 Mg Caps (Benzonatate) .... One by mouth hs as needed cough 17)  Bisoprolol Fumarate 5 Mg Tabs (Bisoprolol fumarate) .... 1/2 tab by mouth once daily  Patient Instructions: 1)  You have classic tennis elbow (lateral epicondylitis) 2)  Wear wrist brace as much as possible for the next 2 weeks (not when showering)  3)  Try to avoid painful painful activities 4)  Ice 3-4 times a day for 15 minutes at a time. 5)  Aleve or ibuprofen for pain over next 1-2 weeks then as needed. 6)  Counterforce brace may be helpful but if this is worsening pain, do not use. 7)  Do home rotation AND extension exercise with  soup can or 1 pound weight - 3 sets of 6 once daily every day. 8)  Consider injection for short term pain relief if this does not  improve over the next 1-2 weeks. 9)  Go to PT 2-3 times a week for next 4 weeks (wean to home program as tolerated). 10)  Follow up with me in 4-6 weeks for recheck and call with any questions.   Orders Added: 1)  Est. Patient Level III [16109] 2)  Brace Wrist [[id number]]

## 2011-02-04 ENCOUNTER — Encounter: Payer: Self-pay | Admitting: Family Medicine

## 2011-02-04 ENCOUNTER — Ambulatory Visit (INDEPENDENT_AMBULATORY_CARE_PROVIDER_SITE_OTHER): Payer: Self-pay | Admitting: Family Medicine

## 2011-02-04 DIAGNOSIS — M7710 Lateral epicondylitis, unspecified elbow: Secondary | ICD-10-CM

## 2011-02-04 DIAGNOSIS — M25539 Pain in unspecified wrist: Secondary | ICD-10-CM

## 2011-02-05 LAB — CONVERTED CEMR LAB: PSA: 3.83 ng/mL (ref <=4.00)

## 2011-02-12 ENCOUNTER — Ambulatory Visit (INDEPENDENT_AMBULATORY_CARE_PROVIDER_SITE_OTHER): Payer: Commercial Managed Care - PPO | Admitting: Internal Medicine

## 2011-02-12 ENCOUNTER — Encounter: Payer: Self-pay | Admitting: Internal Medicine

## 2011-02-12 DIAGNOSIS — K644 Residual hemorrhoidal skin tags: Secondary | ICD-10-CM | POA: Insufficient documentation

## 2011-02-12 DIAGNOSIS — I10 Essential (primary) hypertension: Secondary | ICD-10-CM

## 2011-02-12 DIAGNOSIS — R972 Elevated prostate specific antigen [PSA]: Secondary | ICD-10-CM

## 2011-02-13 NOTE — Assessment & Plan Note (Signed)
Summary: FU/LP/MJD   Vital Signs:  Patient profile:   60 year old male Height:      68 inches (172.72 cm) Weight:      193.0 pounds (87.73 kg) BMI:     29.45 Temp:     98.0 degrees F (36.67 degrees C) oral Pulse rate:   53 / minute BP sitting:   114 / 76  (left arm)  Vitals Entered By: Baxter Hire) (February 04, 2011 3:27 PM) CC: follow-up visit Pain Assessment Patient in pain? yes     Location: Rt. elbow Intensity: 7 Onset of pain  comes with movement  Does patient need assistance? Functional Status Self care Ambulation Normal   Primary Care Provider:  Dondra Spry DO  CC:  follow-up visit.  History of Present Illness: 60 yo M here for f/u R elbow lateral epicondylitis  Initial presentation about 1 month ago He reported no known injury Pain started about 1-2 weeks prior to visit and slowly worsened No swelling or bruising Is right handed Does a good amount of lifting at work - no writing/computer No prior elbow injuries Tried ibuprofen and band over elbow just distal to elbow Tried home grip exercises and stretches At previous OV was given home exercise program, wrist brace to prevent wrist extension, advised to ice, take NSAIDs.  Tried counterforce braces but these worsened pain Since last OV still feels about the same and has been compliant with treatment.  Habits & Providers  Alcohol-Tobacco-Diet     Alcohol drinks/day: 0     Tobacco Status: never  Problems Prior to Update: 1)  Wrist Pain, Right  (ICD-719.43) 2)  Elbow Pain, Right  (ICD-719.42) 3)  Lateral Epicondylitis, Right  (ICD-726.32) 4)  Uri  (ICD-465.9) 5)  Prostate Specific Antigen, Elevated  (ICD-790.93) 6)  Screening Colorectal-cancer  (ICD-V76.51) 7)  Hypertension  (ICD-401.9) 8)  Hyperlipidemia  (ICD-272.4) 9)  Fatigue, Chronic  (ICD-780.79) 10)  Plantar Fasciitis  (ICD-728.71) 11)  Insomnia, Chronic  (ICD-307.42) 12)  Hypothyroidism  (ICD-244.9) 13)  Encounter For Long-term  Use of Other Medications  (ICD-V58.69) 14)  Neoplasm of Uncertain Behavior of Skin  (ICD-238.2) 15)  Hemorrhoids  (ICD-455.6) 16)  Osteoarthritis  (ICD-715.90) 17)  Allergic Rhinitis  (ICD-477.9) 18)  Hearing Deficit  (ICD-389.9) 19)  Proteinuria  (ICD-791.0) 20)  Family History of Alcoholism/addiction  (ICD-V61.41) 21)  Gerd  (ICD-530.81) 22)  Diverticulitis, Hx of  (ICD-V12.79)  Medications Prior to Update: 1)  Lisinopril 5 Mg Tabs (Lisinopril) .... Take One Tablet By Mouth Daily 2)  Omeprazole-Sodium Bicarbonate 40-1100 Mg Caps (Omeprazole-Sodium Bicarbonate) .... One By Mouth Once Daily 3)  Pravastatin Sodium 40 Mg Tabs (Pravastatin Sodium) .... Take 1/2 Tablet Daily 4)  Multivitamins  Tabs (Multiple Vitamin) .... Take 1 Tablet By Mouth Once A Day 5)  Fish Oil 1200 Mg Caps (Omega-3 Fatty Acids) .... 2 Capsules By Mouth Once Daily 6)  Levothyroxine Sodium 100 Mcg Tabs (Levothyroxine Sodium) .... One By Mouth Once Daily 7)  Aspirin 81 Mg Tbec (Aspirin) .... Take 1 Tablet By Mouth Once A Day 8)  Loratadine 10 Mg Tabs (Loratadine) .... Take 1 Tablet By Mouth Once A Day 9)  Nitrostat 0.4 Mg Subl (Nitroglycerin) .Marland Kitchen.. 1 Tablet Under Tongue At Onset of Chest Pain; You May Repeat Every 5 Minutes For Up To 3 Doses. 10)  Ciprofloxacin Hcl 500 Mg Tabs (Ciprofloxacin Hcl) .... One By Mouth Two Times A Day 11)  Delsym 30 Mg/57ml Lqcr (Dextromethorphan Polistirex) .... As  Directed 12)  Dayquil Multi-Symptom 30-325-10 Mg/66ml Liqd (Pseudoephedrine-Apap-Dm) .... As Directed 13)  Vicks Nyquil Multi-Symptom 15-6.25-325 Mg Caps (Dm-Doxylamine-Acetaminophen) .... As Directed 14)  Mucinex 600 Mg Xr12h-Tab (Guaifenesin) .... As Directed 15)  Azithromycin 250 Mg Tabs (Azithromycin) .... Two Tabs By Mouth On Day 1, Then 1 Tab Daily On Days 2 Through 5 16)  Benzonatate 200 Mg Caps (Benzonatate) .... One By Mouth Hs As Needed Cough 17)  Bisoprolol Fumarate 5 Mg Tabs (Bisoprolol Fumarate) .... 1/2 Tab By Mouth  Once Daily  Allergies: 1)  ! Simvastatin  Physical Exam  General:  alert, well-developed, and well-nourished.   Msk:  R elbow: No gross deformity, swelling, or bruising. TTP at lateral epicondyle, less in extensor mass at elbow.  No other TTP. FROM of elbow and wrist. Collateral ligaments intact. Pain reproduced with resisted 3rd finger and wrist extension. NVI distally.   Impression & Recommendations:  Problem # 1:  LATERAL EPICONDYLITIS, RIGHT (ICD-726.32) Assessment Unchanged  Patient has not improved with 1 month of home exercises, PT with ionto, icing, wrist brace, aleve.  Proceed with cortisone injection today.  If still not improving over the next 2-4 weeks despite this, will refer for consideration of debridement.  After informed written consent patient was lying supine on the exam table and area just distal to right lateral epicondyle was prepped with alcohol swab.  Most painful area of extensor mass just distal to epicondyle was injected with 2:1 marcaine:depomedrol while peppering the area with the needle.  Patient tolerated the procedure well without any immediate complications.   Orders: Joint Aspirate / Injection, Intermediate (20605)  Problem # 2:  ELBOW PAIN, RIGHT (ICD-719.42) Assessment: Unchanged See #1 above.  Complete Medication List: 1)  Lisinopril 5 Mg Tabs (Lisinopril) .... Take one tablet by mouth daily 2)  Omeprazole-sodium Bicarbonate 40-1100 Mg Caps (Omeprazole-sodium bicarbonate) .... One by mouth once daily 3)  Pravastatin Sodium 40 Mg Tabs (Pravastatin sodium) .... Take 1/2 tablet daily 4)  Multivitamins Tabs (Multiple vitamin) .... Take 1 tablet by mouth once a day 5)  Fish Oil 1200 Mg Caps (Omega-3 fatty acids) .... 2 capsules by mouth once daily 6)  Levothyroxine Sodium 100 Mcg Tabs (Levothyroxine sodium) .... One by mouth once daily 7)  Aspirin 81 Mg Tbec (Aspirin) .... Take 1 tablet by mouth once a day 8)  Loratadine 10 Mg Tabs (Loratadine)  .... Take 1 tablet by mouth once a day 9)  Nitrostat 0.4 Mg Subl (Nitroglycerin) .Marland Kitchen.. 1 tablet under tongue at onset of chest pain; you may repeat every 5 minutes for up to 3 doses. 10)  Ciprofloxacin Hcl 500 Mg Tabs (Ciprofloxacin hcl) .... One by mouth two times a day 11)  Delsym 30 Mg/52ml Lqcr (Dextromethorphan polistirex) .... As directed 12)  Dayquil Multi-symptom 30-325-10 Mg/31ml Liqd (Pseudoephedrine-apap-dm) .... As directed 13)  Vicks Nyquil Multi-symptom 15-6.25-325 Mg Caps (Dm-doxylamine-acetaminophen) .... As directed 14)  Mucinex 600 Mg Xr12h-tab (Guaifenesin) .... As directed 15)  Azithromycin 250 Mg Tabs (Azithromycin) .... Two tabs by mouth on day 1, then 1 tab daily on days 2 through 5 16)  Benzonatate 200 Mg Caps (Benzonatate) .... One by mouth hs as needed cough 17)  Bisoprolol Fumarate 5 Mg Tabs (Bisoprolol fumarate) .... 1/2 tab by mouth once daily  Patient Instructions: 1)  You have classic tennis elbow (lateral epicondylitis) 2)  You were given a cortisone injection into this area today. 3)  Wear wrist brace as much as possible. 4)  Try to avoid  painful painful activities 5)  Ice 3-4 times a day for 15 minutes at a time. 6)  Aleve or ibuprofen as needed. 7)  Do not resume home exercises again for 10-14 days. 8)  Do home rotation AND extension exercise with  soup can or 1 pound weight - 3 sets of 6 once daily every day. 9)  If after 2 weeks you're the same or worse despite injection, call me and we'll refer you to a surgeon for debridement of the area. 10)  Otherwise follow up with me in 1 month for a recheck.   Orders Added: 1)  Est. Patient Level III [16109] 2)  Joint Aspirate / Injection, Intermediate [20605]

## 2011-03-04 ENCOUNTER — Encounter: Payer: Self-pay | Admitting: *Deleted

## 2011-03-05 NOTE — Assessment & Plan Note (Signed)
Summary: 3 month f/u/hea   Vital Signs:  Patient profile:   60 year old male Height:      68 inches Weight:      189.25 pounds BMI:     28.88 O2 Sat:      98 % on Room air Temp:     97.8 degrees F oral Pulse rate:   58 / minute Resp:     18 per minute BP sitting:   100 / 72  (right arm) Cuff size:   large  Vitals Entered By: Glendell Docker CMA (February 12, 2011 3:23 PM)  O2 Flow:  Room air CC: 3 Month Follow up  Is Patient Diabetic? No Pain Assessment Patient in pain? no        Primary Care Provider:  Dondra Spry DO  CC:  3 Month Follow up .  History of Present Illness:  Hypertension Follow-Up      This is a 60 year old man who presents for Hypertension follow-up.  The patient denies lightheadedness, headaches, and edema.  The patient denies the following associated symptoms: chest pain.  Compliance with medications (by patient report) has been near 100%.  The patient reports that dietary compliance has been fair.    Reviewed PSA - decreased from employee healthcare screen   Preventive Screening-Counseling & Management  Alcohol-Tobacco     Smoking Status: never  Allergies: 1)  ! Simvastatin  Past History:  Past Medical History: HYPERTENSION HYPERLIPIDEMIA  FATIGUE, CHRONIC    PLANTAR FASCIITIS  INSOMNIA, CHRONIC HYPOTHYROIDISM  NEOPLASM OF UNCERTAIN BEHAVIOR OF SKIN  HEMORRHOIDS OSTEOARTHRITIS  HEARING DEFICIT PROTEINURIA  GERD DIVERTICULITIS, HX OF  Past Surgical History: herniated disc surgery 1978  Hemorrhoidectomy cyst removal            Family History: Family History of Alcoholism/Addiction Family History of Arthritis Family History of  breast cancer Family History of  prostate cancer  Family History of  CAD          Family History of Irritable Bowel Syndrome: Family History of Cystic Fibrosis:      Social History: Occupation: Engineer, materials at [employer] Married Automotive engineer) 2 children   Never Smoked     Alcohol  use-yes    Previously  Camera operator - Designer, industrial/product [employer] Illicit Drug Use - no  Physical Exam  General:  alert, well-developed, and well-nourished.   Lungs:  normal respiratory effort and normal breath sounds.   Heart:  normal rate, regular rhythm, and no gallop.   Prostate:  no nodules, no asymmetry, and 1+ enlarged.     Impression & Recommendations:  Problem # 1:  PROSTATE SPECIFIC ANTIGEN, ELEVATED (ICD-790.93) Assessment Improved probable BPH start finasteride monitor PSA  Problem # 2:  HYPERTENSION (ICD-401.9) Assessment: Improved  His updated medication list for this problem includes:    Lisinopril 5 Mg Tabs (Lisinopril) .Marland Kitchen... Take one tablet by mouth daily    Bisoprolol Fumarate 5 Mg Tabs (Bisoprolol fumarate) .Marland Kitchen... 1/2 tab by mouth once daily  BP today: 100/72 Prior BP: 114/76 (02/04/2011)  Labs Reviewed: K+: 4.5 (11/30/2009) Creat: : 1.18 (11/30/2009)   Chol: 155 (11/30/2009)   HDL: 35 (11/30/2009)   LDL: 94 (11/30/2009)   TG: 129 (11/30/2009)  Problem # 3:  EXTERNAL HEMORRHOIDS (ICD-455.3) refilled hemorrhoid crm. Patient advised to call office if symptoms persist or worsen.  Complete Medication List: 1)  Lisinopril 5 Mg Tabs (Lisinopril) .... Take one tablet by mouth daily 2)  Omeprazole-sodium Bicarbonate 40-1100  Mg Caps (Omeprazole-sodium bicarbonate) .... One by mouth once daily 3)  Pravastatin Sodium 40 Mg Tabs (Pravastatin sodium) .... Take 1/2 tablet daily 4)  Multivitamins Tabs (Multiple vitamin) .... Take 1 tablet by mouth once a day 5)  Fish Oil 1200 Mg Caps (Omega-3 fatty acids) .... 2 capsules by mouth once daily 6)  Levothyroxine Sodium 100 Mcg Tabs (Levothyroxine sodium) .... One by mouth once daily 7)  Aspirin 81 Mg Tbec (Aspirin) .... Take 1 tablet by mouth once a day 8)  Loratadine 10 Mg Tabs (Loratadine) .... Take 1 tablet by mouth once a day 9)  Nitrostat 0.4 Mg Subl (Nitroglycerin) .Marland Kitchen.. 1 tablet under tongue at onset of chest  pain; you may repeat every 5 minutes for up to 3 doses. 10)  Bisoprolol Fumarate 5 Mg Tabs (Bisoprolol fumarate) .... 1/2 tab by mouth once daily 11)  Finasteride 5 Mg Tabs (Finasteride) .... One by mouth once daily 12)  Hydrocortisone 2.5 % Crea (Hydrocortisone) .... Apply 4 x per day for 1 week  Patient Instructions: 1)  Please schedule a follow-up appointment in 6 months. 2)  BMP prior to visit, ICD-9:  401.9 3)  TSH prior to visit, ICD-9: 244.9 4)  PSA prior to visit, ICD-9: 600.01 5)  Please return for lab work one (1) week before your next appointment.  Prescriptions: HYDROCORTISONE 2.5 % CREA (HYDROCORTISONE) apply 4 x per day for 1 week  #1 week x 3   Entered and Authorized by:   D. Thomos Lemons DO   Signed by:   D. Thomos Lemons DO on 02/12/2011   Method used:   Electronically to        [facility]* (retail)       [address]       [address]       [place], [place]  [address]       Ph: [phone]       Fax: [phone]   RxID:   [id number] BISOPROLOL FUMARATE 5 MG TABS (BISOPROLOL FUMARATE) 1/2 tab by mouth once daily  #45 x 1   Entered and Authorized by:   D. Thomos Lemons DO   Signed by:   D. Thomos Lemons DO on 02/12/2011   Method used:   Electronically to        [facility]* (retail)       [address]       [address]       [place], [place]  [address]       Ph: [phone]       Fax: [phone]   RxID:   [id number] FINASTERIDE 5 MG TABS (FINASTERIDE) one by mouth once daily  #90 x 1   Entered and Authorized by:   D. Thomos Lemons DO   Signed by:   D. Thomos Lemons DO on 02/12/2011   Method used:   Electronically to        [facility]* (retail)       [address]       [address]       [place], [place]  [address]       Ph: [phone]       Fax: [phone]   RxID:   [id number]    Orders Added: 1)  Est. Patient Level III  [51761]   Immunization History:  Influenza Immunization History:    Influenza:  historical (11/06/2010)  Immunization History:  Influenza Immunization History:    Influenza:  Historical (11/06/2010)  Current Allergies (reviewed today): ! SIMVASTATIN   Orders Added: 1)  Est. Patient Level III [14782]

## 2011-03-08 LAB — BASIC METABOLIC PANEL
BUN: 11 mg/dL (ref 6–23)
BUN: 13 mg/dL (ref 6–23)
BUN: 18 mg/dL (ref 6–23)
CO2: 25 mEq/L (ref 19–32)
CO2: 27 mEq/L (ref 19–32)
CO2: 28 mEq/L (ref 19–32)
Calcium: 9.2 mg/dL (ref 8.4–10.5)
Calcium: 9.6 mg/dL (ref 8.4–10.5)
Calcium: 9.6 mg/dL (ref 8.4–10.5)
Chloride: 101 mEq/L (ref 96–112)
Chloride: 104 mEq/L (ref 96–112)
Chloride: 106 mEq/L (ref 96–112)
Creatinine, Ser: 0.9 mg/dL (ref 0.4–1.5)
Creatinine, Ser: 0.91 mg/dL (ref 0.4–1.5)
Creatinine, Ser: 0.95 mg/dL (ref 0.4–1.5)
GFR calc Af Amer: >60 mL/min (ref >60)
GFR calc Af Amer: >60 mL/min (ref >60)
GFR calc Af Amer: >60 mL/min (ref >60)
GFR calc non Af Amer: >60 mL/min (ref >60)
GFR calc non Af Amer: >60 mL/min (ref >60)
GFR calc non Af Amer: >60 mL/min (ref >60)
Glucose, Bld: 105 mg/dL — ABNORMAL HIGH (ref 70–99)
Glucose, Bld: 106 mg/dL — ABNORMAL HIGH (ref 70–99)
Glucose, Bld: 95 mg/dL (ref 70–99)
Potassium: 3.9 mEq/L (ref 3.5–5.1)
Potassium: 4.2 mEq/L (ref 3.5–5.1)
Potassium: 5.5 mEq/L — ABNORMAL HIGH (ref 3.5–5.1)
Sodium: 139 mEq/L (ref 135–145)
Sodium: 139 mEq/L (ref 135–145)
Sodium: 140 mEq/L (ref 135–145)

## 2011-03-08 LAB — CBC
HCT: 43.6 % (ref 39.0–52.0)
HCT: 45 % (ref 39.0–52.0)
Hemoglobin: 15.2 g/dL (ref 13.0–17.0)
Hemoglobin: 15.7 g/dL (ref 13.0–17.0)
Hemoglobin: 16.1 g/dL (ref 13.0–17.0)
MCH: 31 pg (ref 26.0–34.0)
MCH: 31.5 pg (ref 26.0–34.0)
MCH: 32 pg (ref 26.0–34.0)
MCHC: 34.9 g/dL (ref 30.0–36.0)
MCHC: 34.9 g/dL (ref 30.0–36.0)
MCV: 88.8 fL (ref 78.0–100.0)
MCV: 91.8 fL (ref 78.0–100.0)
Platelets: 203 10*3/uL (ref 150–400)
Platelets: 216 10*3/uL (ref 150–400)
Platelets: 241 10*3/uL (ref 150–400)
RBC: 4.9 MIL/uL (ref 4.22–5.81)
RBC: 4.91 MIL/uL (ref 4.22–5.81)
RBC: 5.11 MIL/uL (ref 4.22–5.81)
RDW: 12.2 % (ref 11.5–15.5)
RDW: 12.5 % (ref 11.5–15.5)
WBC: 6.5 10*3/uL (ref 4.0–10.5)
WBC: 6.6 10*3/uL (ref 4.0–10.5)
WBC: 7 10*3/uL (ref 4.0–10.5)

## 2011-03-08 LAB — DIFFERENTIAL
Basophils Absolute: 0.2 10*3/uL — ABNORMAL HIGH (ref 0.0–0.1)
Basophils Relative: 2 % — ABNORMAL HIGH (ref 0–1)
Eosinophils Absolute: 0.5 10*3/uL (ref 0.0–0.7)
Eosinophils Relative: 7 % — ABNORMAL HIGH (ref 0–5)
Lymphocytes Relative: 30 % (ref 12–46)
Lymphs Abs: 2.1 10*3/uL (ref 0.7–4.0)
Monocytes Absolute: 0.6 10*3/uL (ref 0.1–1.0)
Monocytes Relative: 8 % (ref 3–12)
Neutro Abs: 3.6 10*3/uL (ref 1.7–7.7)
Neutrophils Relative %: 53 % (ref 43–77)

## 2011-03-08 LAB — CARDIAC PANEL(CRET KIN+CKTOT+MB+TROPI)
CK, MB: 3 ng/mL (ref 0.3–4.0)
CK, MB: 3 ng/mL (ref 0.3–4.0)
Relative Index: 1.6 (ref 0.0–2.5)
Relative Index: 1.7 (ref 0.0–2.5)
Total CK: 179 U/L (ref 7–232)
Total CK: 188 U/L (ref 7–232)
Troponin I: 0.01 ng/mL (ref 0.00–0.06)
Troponin I: 0.02 ng/mL (ref 0.00–0.06)

## 2011-03-08 LAB — POCT CARDIAC MARKERS
CKMB, poc: 2 ng/mL (ref 1.0–8.0)
Myoglobin, poc: 81 ng/mL (ref 12–200)
Troponin i, poc: <0.05 ng/mL (ref 0.00–0.09)

## 2011-03-08 LAB — LIPID PANEL
Cholesterol: 150 mg/dL (ref 0–200)
HDL: 43 mg/dL (ref >39)
LDL Cholesterol: 91 mg/dL (ref 0–99)
Total CHOL/HDL Ratio: 3.5 RATIO
Triglycerides: 81 mg/dL (ref <150)
VLDL: 16 mg/dL (ref 0–40)

## 2011-03-08 LAB — D-DIMER, QUANTITATIVE: D-Dimer, Quant: <0.22 ug/mL-FEU (ref 0.00–0.48)

## 2011-03-08 LAB — HEPARIN LEVEL (UNFRACTIONATED): Heparin Unfractionated: 0.46 IU/mL (ref 0.30–0.70)

## 2011-03-08 LAB — PROTIME-INR
INR: 0.89 (ref 0.00–1.49)
Prothrombin Time: 12.3 seconds (ref 11.6–15.2)

## 2011-03-08 LAB — APTT: aPTT: 30 seconds (ref 24–37)

## 2011-04-08 ENCOUNTER — Ambulatory Visit (INDEPENDENT_AMBULATORY_CARE_PROVIDER_SITE_OTHER): Payer: Commercial Managed Care - PPO | Admitting: Family Medicine

## 2011-04-08 ENCOUNTER — Encounter: Payer: Self-pay | Admitting: Family Medicine

## 2011-04-08 VITALS — BP 102/67 | HR 52 | Temp 97.9°F | Ht 68.0 in | Wt 189.0 lb

## 2011-04-08 DIAGNOSIS — M7710 Lateral epicondylitis, unspecified elbow: Secondary | ICD-10-CM

## 2011-04-09 ENCOUNTER — Encounter: Payer: Self-pay | Admitting: Family Medicine

## 2011-04-09 ENCOUNTER — Telehealth: Payer: Self-pay | Admitting: Internal Medicine

## 2011-04-09 DIAGNOSIS — E785 Hyperlipidemia, unspecified: Secondary | ICD-10-CM

## 2011-04-09 MED ORDER — PRAVASTATIN SODIUM 40 MG PO TABS: 20.0000 mg | ORAL_TABLET | Freq: Every day | ORAL | Status: DC

## 2011-04-09 NOTE — Telephone Encounter (Signed)
PRAVASTATIN 40 MG TAKE 1 PER DAY.  Zachary [name] OUTPATIENT [facility]

## 2011-04-09 NOTE — Assessment & Plan Note (Signed)
no improvement with home exercises, PT, icing, nsaids, counterforce and wrist braces.  Some improvement with injection but pain recurred.  Discussed options - repeating injection, nitro patches (or both), or surgical referral.  He would like to proceed with repeat injection - advised would not repeat again after this one if no long term benefit obtained from this and home exercises.  Declined nitro patches - severe headache when he's had nitro in past.  If not improving over the next few weeks will refer to orthopedic surgeon for consideration of debridement.  After informed written consent patient was lying supine on the exam table and area just distal to right lateral epicondyle was prepped with alcohol swab.  Most painful area of extensor mass just distal to epicondyle was injected with 2:1 marcaine:depomedrol while peppering the area with the needle.  Patient tolerated the procedure well without any immediate complications.

## 2011-04-09 NOTE — Progress Notes (Signed)
Subjective:    Patient ID: Zachary [name], male    DOB: 1951-12-05, 60 y.o.   MRN: [id number]  HPI 60 yo M here for f/u R elbow lateral epicondylitis  He reported no known injury Pain started insidiously and worsened t right lateral epicondyle and into forearm. Pain worse with wrist extension, finger extension. No swelling or bruising Is right handed Does a good amount of lifting at work - no writing/computer No prior elbow injuries Tried home exercises, wrist brace, counterforce brace, icing, nsaids, cortisone injection. Cortisone injection provided relief for a few weeks but then recurred.  Past Medical History  Diagnosis Date  . Hyperlipidemia   . Hypertension   . Insomnia   . Hypothyroidism   . GERD (gastroesophageal reflux disease)     Current Outpatient Prescriptions on File Prior to Visit  Medication Sig Dispense Refill  . aspirin EC 81 MG EC tablet Take 81 mg by mouth daily.        . bisoprolol (ZEBETA) 5 MG tablet 1/2 tab by mouth once daily       . finasteride (PROSCAR) 5 MG tablet Take 5 mg by mouth daily.        . hydrocortisone 2.5 % cream Apply topically 4 (four) times daily. For 1 week       . levothyroxine (SYNTHROID, LEVOTHROID) 100 MCG tablet Take 100 mcg by mouth daily.        Marland Kitchen lisinopril (PRINIVIL,ZESTRIL) 5 MG tablet Take 5 mg by mouth daily.        Marland Kitchen loratadine (CLARITIN) 10 MG tablet Take 10 mg by mouth daily.        . Multiple Vitamin (MULTIVITAMINS PO) Take 1 tablet by mouth daily.        . nitroGLYCERIN (NITROSTAT) 0.4 MG SL tablet Place 0.4 mg under the tongue. At onset of chest pain; you may repeat every 5 minutes for up to 3 doses.       . Omega-3 Fatty Acids (FISH OIL) 1200 MG CAPS Take 2 capsules by mouth daily.        Marland Kitchen omeprazole-sodium bicarbonate (ZEGERID) 40-1100 MG per capsule Take 1 capsule by mouth daily.        . pravastatin (PRAVACHOL) 40 MG tablet Take 20 mg by mouth daily.          No past surgical history on file.  Allergies    Allergen Reactions  . Simvastatin     REACTION: muscle pain    History   Social History  . Marital Status: Married    Spouse Name: N/A    Number of Children: N/A  . Years of Education: N/A   Occupational History  . Not on file.   Social History Main Topics  . Smoking status: Never Smoker   . Smokeless tobacco: Not on file  . Alcohol Use: Not on file  . Drug Use: Not on file  . Sexually Active: Not on file   Other Topics Concern  . Not on file   Social History Narrative  . No narrative on file    No family history on file.  BP 102/67  Pulse 52  Temp(Src) 97.9 F (36.6 C) (Oral)  Ht 5\' 8"  (1.727 m)  Wt 189 lb (85.73 kg)  BMI 28.74 kg/m2  Review of Systems See HPI above.    Objective:   Physical Exam General:  alert, well-developed, and well-nourished.    R elbow: No gross deformity, swelling, or bruising. TTP at lateral epicondyle, less  in extensor mass at elbow.  No other TTP. FROM of elbow and wrist. Collateral ligaments intact. Pain reproduced with resisted 3rd finger and wrist extension. NVI distally.     Assessment & Plan:  1. Right lateral epicondylitis - no improvement with home exercises, PT, icing, nsaids, counterforce and wrist braces.  Some improvement with injection but pain recurred.  Discussed options - repeating injection, nitro patches (or both), or surgical referral.  He would like to proceed with repeat injection - advised would not repeat again after this one if no long term benefit obtained from this and home exercises.  Declined nitro patches - severe headache when he's had nitro in past.  If not improving over the next few weeks will refer to orthopedic surgeon for consideration of debridement.  After informed written consent patient was lying supine on the exam table and area just distal to right lateral epicondyle was prepped with alcohol swab.  Most painful area of extensor mass just distal to epicondyle was injected with 2:1  marcaine:depomedrol while peppering the area with the needle.  Patient tolerated the procedure well without any immediate complications.

## 2011-04-09 NOTE — Telephone Encounter (Signed)
Medication refill sent to pharmacy  

## 2011-04-19 ENCOUNTER — Encounter: Payer: Self-pay | Admitting: Pulmonary Disease

## 2011-04-23 ENCOUNTER — Encounter: Payer: Self-pay | Admitting: Pulmonary Disease

## 2011-04-23 ENCOUNTER — Ambulatory Visit (INDEPENDENT_AMBULATORY_CARE_PROVIDER_SITE_OTHER): Payer: Commercial Managed Care - PPO | Admitting: Pulmonary Disease

## 2011-04-23 VITALS — BP 100/76 | HR 59 | Temp 98.0°F | Ht 68.0 in | Wt 192.0 lb

## 2011-04-23 DIAGNOSIS — G4733 Obstructive sleep apnea (adult) (pediatric): Secondary | ICD-10-CM

## 2011-04-23 NOTE — Assessment & Plan Note (Signed)
Also associated with insomnia of sleep maintenance - UNclear if this is an independent problem or due to obstructive sleep apnea . Given excessive daytime somnolence, narrow pharyngeal exam, witnessed apneas & loud snoring, obstructive sleep apnea is very likely & an overnight polysomnogram will be scheduled as a split study. The pathophysiology of obstructive sleep apnea , it's cardiovascular consequences & modes of treatment including CPAP were discused with the patient in detail & they evidenced understanding.

## 2011-04-23 NOTE — Patient Instructions (Signed)
You probably have obstructive sleep apnea  Sleep study  FU 2 weeks after

## 2011-04-23 NOTE — Progress Notes (Signed)
  Subjective:    Patient ID: Zachary [name], male    DOB: 05-13-1951, 60 y.o.   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 Cath neg 2010 Bedtime is 9.30 p, latency 20 mins, 3-4 awakenings, inability to fall back asleep about once/wk, oob at 0440 feeling tired There is no history suggestive of cataplexy, sleep paralysis or parasomnias     Review of Systems  Constitutional: Negative for fever, appetite change and unexpected weight change.  HENT: Negative for ear pain, congestion, sore throat, rhinorrhea, sneezing, trouble swallowing, dental problem and postnasal drip.   Eyes: Negative for redness.  Respiratory: Positive for chest tightness. Negative for cough, shortness of breath and wheezing.   Cardiovascular: Negative for chest pain, palpitations and leg swelling.  Gastrointestinal: Negative for nausea, vomiting, abdominal pain and diarrhea.  Genitourinary: Negative for dysuria and urgency.  Musculoskeletal: Negative for joint swelling.  Skin: Negative for rash.  Neurological: Negative for syncope and headaches.  Hematological: Does not bruise/bleed easily.  Psychiatric/Behavioral: Negative for dysphoric mood. The patient is not nervous/anxious.        Objective:   Physical Exam    Gen. Pleasant, well-nourished, in no distress, normal affect ENT - no lesions, no post nasal drip, class 2 airway Neck: No JVD, no thyromegaly, no carotid bruits Lungs: no use of accessory muscles, no dullness to percussion, clear without rales or rhonchi  Cardiovascular: Rhythm regular, heart sounds  normal, no murmurs or gallops, no  peripheral edema Abdomen: soft and non-tender, no hepatosplenomegaly, BS normal. Musculoskeletal: No deformities, no cyanosis or clubbing Neuro:  alert, non focal     Assessment & Plan:

## 2011-04-29 ENCOUNTER — Telehealth: Payer: Self-pay | Admitting: Family Medicine

## 2011-04-29 DIAGNOSIS — M7711 Lateral epicondylitis, right elbow: Secondary | ICD-10-CM

## 2011-04-29 NOTE — Telephone Encounter (Signed)
Spoke with patient - despite repeat cortisone injection for lateral epicondylitis, pain persists.  As with first injection, had good relief initially - this time for 2-3 weeks - then pain worsened over the weekend.  Will refer him to Dr. Thurston Hole or Dr. Eulah Pont for consideration of surgical intervention for right lateral epicondylitis

## 2011-05-07 ENCOUNTER — Encounter: Payer: Self-pay | Admitting: Internal Medicine

## 2011-05-13 ENCOUNTER — Ambulatory Visit (HOSPITAL_BASED_OUTPATIENT_CLINIC_OR_DEPARTMENT_OTHER): Payer: 59 | Attending: Pulmonary Disease

## 2011-05-13 DIAGNOSIS — R5381 Other malaise: Secondary | ICD-10-CM | POA: Insufficient documentation

## 2011-05-13 DIAGNOSIS — R0681 Apnea, not elsewhere classified: Secondary | ICD-10-CM | POA: Insufficient documentation

## 2011-05-13 DIAGNOSIS — R0609 Other forms of dyspnea: Secondary | ICD-10-CM | POA: Insufficient documentation

## 2011-05-13 DIAGNOSIS — Z79899 Other long term (current) drug therapy: Secondary | ICD-10-CM | POA: Insufficient documentation

## 2011-05-13 DIAGNOSIS — G4763 Sleep related bruxism: Secondary | ICD-10-CM | POA: Insufficient documentation

## 2011-05-13 DIAGNOSIS — R0989 Other specified symptoms and signs involving the circulatory and respiratory systems: Secondary | ICD-10-CM | POA: Insufficient documentation

## 2011-05-13 DIAGNOSIS — I10 Essential (primary) hypertension: Secondary | ICD-10-CM | POA: Insufficient documentation

## 2011-05-14 ENCOUNTER — Other Ambulatory Visit (HOSPITAL_BASED_OUTPATIENT_CLINIC_OR_DEPARTMENT_OTHER): Payer: Self-pay | Admitting: Orthopedic Surgery

## 2011-05-14 DIAGNOSIS — R52 Pain, unspecified: Secondary | ICD-10-CM

## 2011-05-15 ENCOUNTER — Ambulatory Visit (HOSPITAL_BASED_OUTPATIENT_CLINIC_OR_DEPARTMENT_OTHER)
Admission: RE | Admit: 2011-05-15 | Discharge: 2011-05-15 | Disposition: A | Discharge status: Home / Self Care | Payer: 59 | Source: Ambulatory Visit | Attending: Orthopedic Surgery | Admitting: Orthopedic Surgery

## 2011-05-15 DIAGNOSIS — R52 Pain, unspecified: Secondary | ICD-10-CM

## 2011-05-15 DIAGNOSIS — M6790 Unspecified disorder of synovium and tendon, unspecified site: Secondary | ICD-10-CM

## 2011-05-15 DIAGNOSIS — M249 Joint derangement, unspecified: Secondary | ICD-10-CM | POA: Insufficient documentation

## 2011-05-15 DIAGNOSIS — M25529 Pain in unspecified elbow: Secondary | ICD-10-CM | POA: Insufficient documentation

## 2011-05-15 DIAGNOSIS — G4737 Central sleep apnea in conditions classified elsewhere: Secondary | ICD-10-CM

## 2011-05-16 ENCOUNTER — Encounter: Payer: Self-pay | Admitting: Pulmonary Disease

## 2011-05-16 NOTE — Procedures (Addendum)
NAME:  Zachary, [name] NO.:  [id number]  MEDICAL RECORD NO.:  [id number]          PATIENT TYPE:  OUT  LOCATION:  SLEEP CENTER                 FACILITY:  [facility]  PHYSICIAN:  Oretha Milch, MD      DATE OF BIRTH:  March 10, 1951  DATE OF STUDY:  05/15/2011                           NOCTURNAL POLYSOMNOGRAM  REFERRING PHYSICIAN:  Oretha Milch, MD  INDICATION FOR STUDY:  [name] is a 60 year old gentleman with loud snoring, excessive daytime fatigue, restless and nonrefreshing sleep with frequent napping, witnessed apneas and hypertension.  At the time of this study, he weighed 194 pounds with a height of 5 feet 8 inches, BMI of 29, neck size of 15 inches.  EPWORTH SLEEPINESS SCORE:  19.  MEDICATIONS:  Aspirin, Zebeta, Proscar, Synthroid, Prinivil, Claritin, Nitrostat, fish oil, Zegerid and Pravachol.  This nocturnal polysomnogram was performed with a sleep technologist in attendance.  EEG, EOG, EMG, EKG and respiratory parameters were recorded.  Sleep stages arousals, limb movements, and respiratory data were scored according to criteria laid out by the American Academy of Sleep Medicine.  SLEEP ARCHITECTURE:  Lights out was at 9:57 p.m.  Lights on was at 5:05 a.m.  Total sleep time was 343 minutes with sleep period time of and sleep efficiency of 80%.  Sleep latency was 16 minutes and latency to REM sleep was 79 minutes.  Wake after sleep onset was 69 minutes.  Sleep stages as a percentage of total sleep time was N1 11%, N2 68%, N3 0%, REM sleep 20% (70 minutes).  Supine sleep accounted for 45 minutes and supine REM sleep for 18 minutes.  REM sleep was seen in 4- 5 stages scattered through the night with the longest period around 4:00 a.m.  AROUSAL DATA:  There were total of 83 arousals with an arousal index of 14 events per hour.  Most of these were spontaneous.  RESPIRATORY DATA:  There were total of 5 obstructive apneas, 1 central apnea, 0 mixed  apneas and 13 hypopneas with an apnea/hypopnea index of 3.3 events per hour, 41 RERAs were noted with an RDI of 10.5 events per hour.  The longest hypopnea was 26 seconds.  LIMB MOVEMENT DATA:  No significant limb movements were noted.  OXYGEN DATA:  The desaturation index was 4 events per hour.  The lowest desaturation was 87%.  He spent 0.2 minutes with a saturation less than 88%.  CARDIAC DATA:  The low heart rate was 44 beats per minute, the high heart rate was 109 beats per minute.  No arrhythmias were noted.  MOVEMENT-PARASOMNIA:  none noted  DISCUSSION:  He was desensitized with a medium nasal mask but did not meet criteria for CPAP intervention.  A few events of bruxism were noted.  Supine REM sleep was noted and hence this seems to be an adequate study.  </IMPRESSIONS- 1. Mild sleep disordered breathing with increased respiratory effort-     related arousals suggestive of upper airway resistant syndrome     causing mild sleep fragmentation. 2. No evidence of cardiac arrhythmias or behavioral disturbance during     sleep. 3. Episodes of bruxism were noted.  RECOMMENDATIONS/>  1.  When clinically correlated, we should explore upper airway     resistance syndrome and bruxism as causes of his excessive daytime     fatigue and nonrefreshing sleep.As such an oral appliance may be     indicated. 2. Sleep architecture appears normal.  Rules of sleep hygiene can be     discussed. 3. Effective medications can be explored.     Oretha Milch, MD Electronically Signed    RVA/MEDQ  D:  05/15/2011 15:15:20  T:  05/16/2011 03:53:35  Job:  [id number]

## 2011-05-18 ENCOUNTER — Ambulatory Visit
Admission: RE | Admit: 2011-05-18 | Discharge: 2011-05-18 | Disposition: A | Discharge status: Home / Self Care | Payer: 59 | Source: Ambulatory Visit | Attending: Family Medicine | Admitting: Family Medicine

## 2011-05-18 ENCOUNTER — Encounter: Payer: Self-pay | Admitting: Family Medicine

## 2011-05-18 ENCOUNTER — Other Ambulatory Visit: Payer: Self-pay | Admitting: Family Medicine

## 2011-05-18 ENCOUNTER — Inpatient Hospital Stay (INDEPENDENT_AMBULATORY_CARE_PROVIDER_SITE_OTHER)
Admission: RE | Admit: 2011-05-18 | Discharge: 2011-05-18 | Disposition: A | Discharge status: Home / Self Care | Payer: 59 | Source: Ambulatory Visit | Attending: Family Medicine | Admitting: Family Medicine

## 2011-05-18 DIAGNOSIS — M25569 Pain in unspecified knee: Secondary | ICD-10-CM

## 2011-05-18 DIAGNOSIS — IMO0002 Reserved for concepts with insufficient information to code with codable children: Secondary | ICD-10-CM

## 2011-05-24 ENCOUNTER — Telehealth (INDEPENDENT_AMBULATORY_CARE_PROVIDER_SITE_OTHER): Payer: Self-pay | Admitting: Emergency Medicine

## 2011-05-28 ENCOUNTER — Telehealth: Payer: Self-pay | Admitting: Internal Medicine

## 2011-05-28 ENCOUNTER — Encounter: Payer: Self-pay | Admitting: Pulmonary Disease

## 2011-05-28 ENCOUNTER — Ambulatory Visit (INDEPENDENT_AMBULATORY_CARE_PROVIDER_SITE_OTHER): Payer: 59 | Admitting: Pulmonary Disease

## 2011-05-28 VITALS — BP 110/70 | HR 60 | Temp 97.9°F | Ht 68.0 in | Wt 191.5 lb

## 2011-05-28 DIAGNOSIS — G4733 Obstructive sleep apnea (adult) (pediatric): Secondary | ICD-10-CM

## 2011-05-28 MED ORDER — LISINOPRIL 5 MG PO TABS: 5.0000 mg | ORAL_TABLET | Freq: Every day | ORAL | Status: DC

## 2011-05-28 NOTE — Progress Notes (Signed)
  Subjective:    Patient ID: Zachary [name], male    DOB: 29-Jun-1951, 60 y.o.   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  Cath neg 2010  Bedtime is 9.30 p, latency 20 mins, 3-4 awakenings, inability to fall back asleep about once/wk, oob at 0440 feeling tired   PSG showed mild obstructive sleep apnea , predmominat RERAs s/o UARS with RDI 11/h.He was desensitized with medium nasal mask. Some bruxism was noted There is no history suggestive of cataplexy, sleep paralysis or parasomnias      Review of Systems Pt denies any significant  nasal congestion or excess secretions, fever, chills, sweats, unintended wt loss, pleuritic or exertional cp, orthopnea pnd or leg swelling.  Pt also denies any obvious fluctuation in symptoms with weather or environmental change or other alleviating or aggravating factors.    Pt denies any increase in rescue therapy over baseline, denies waking up needing it or having early am exacerbations or coughing/wheezing/ or dyspnea       Objective:   Physical Exam Gen. Pleasant, well-nourished, in no distress ENT - no lesions, no post nasal drip, class 2 airway, no attrition of molars Neck: No JVD, no thyromegaly, no carotid bruits Lungs: no use of accessory muscles, no dullness to percussion, clear without rales or rhonchi  Cardiovascular: Rhythm regular, heart sounds  normal, no murmurs or gallops, no peripheral edema Musculoskeletal: No deformities, no cyanosis or clubbing          Assessment & Plan:

## 2011-05-28 NOTE — Telephone Encounter (Signed)
Lisinopril 5mg  tablet. Take one tablet by mouth daily. Qty 90. Last fill 3.5.12.   Pharmacist comments: please send new refill [facility].

## 2011-05-28 NOTE — Patient Instructions (Signed)
You have mild obstructive sleep apnea  Trial of autoCPAP machine OK to proceed with elbow surgery Call me in 2 weeks with report

## 2011-05-28 NOTE — Telephone Encounter (Signed)
Rx refill sent to pharmacy. 

## 2011-05-28 NOTE — Assessment & Plan Note (Signed)
Discussed various options- oral appliance vs CPAP. He really liked being on CPAP during desensitization trial, hence proceed with this, start on autoCPPA with donwload in 49month - would expect dramatic imporvement in fatigue if this is the reason Pursue 5-10 lb wt loss & exercise program Weight loss encouraged, compliance with goal of at least 4-6 hrs every night is the expectation. Advised against medications with sedative side effects Cautioned against driving when sleepy - understanding that sleepiness will vary on a day to day basis

## 2011-05-31 ENCOUNTER — Telehealth: Payer: Self-pay | Admitting: Cardiology

## 2011-05-31 ENCOUNTER — Telehealth: Payer: Self-pay | Admitting: Internal Medicine

## 2011-05-31 NOTE — Telephone Encounter (Signed)
Spoke with Olegario Messier from Dr. Greig Right office. Pt is scheduled for right elbow tendon repair on 06/06/11. Pt's last office visit with Dr. Jens Som was 09/05/10. Olegario Messier is aware that he may needs to be seen in the office for clearance. She  hope  it won't be necessary.

## 2011-05-31 NOTE — Telephone Encounter (Signed)
Cathy from Dr. Yvonna Alanis office states that Dr. Eulah Pont wants a letter from Dr. Artist Pais stating clearance for pt's upcoming elbow sx on Thursday 06-06-11.   P: [phone] ext. [phone] F: [phone]

## 2011-05-31 NOTE — Telephone Encounter (Signed)
Zachary [name] from dr Eulah Pont office states pt needs sur clearance elbow surgery on June 14th. Fax# [phone]

## 2011-06-04 ENCOUNTER — Encounter (HOSPITAL_BASED_OUTPATIENT_CLINIC_OR_DEPARTMENT_OTHER)
Admission: RE | Admit: 2011-06-04 | Discharge: 2011-06-04 | Disposition: A | Discharge status: Home / Self Care | Payer: 59 | Source: Ambulatory Visit | Attending: Orthopedic Surgery | Admitting: Orthopedic Surgery

## 2011-06-04 LAB — BASIC METABOLIC PANEL
CO2: 28 mEq/L (ref 19–32)
Calcium: 9.9 mg/dL (ref 8.4–10.5)
Chloride: 101 mEq/L (ref 96–112)
Glucose, Bld: 88 mg/dL (ref 70–99)
Sodium: 137 mEq/L (ref 135–145)

## 2011-06-05 ENCOUNTER — Encounter: Payer: Self-pay | Admitting: Family Medicine

## 2011-06-05 NOTE — Telephone Encounter (Signed)
Call placed to Dewaine Conger at [phone] verified with Nettie Elm that letter has been received by office.

## 2011-06-05 NOTE — Telephone Encounter (Signed)
I did this note this morning and faxed it to the fax # they listed.  He is cleared.--PM

## 2011-06-06 ENCOUNTER — Ambulatory Visit (HOSPITAL_BASED_OUTPATIENT_CLINIC_OR_DEPARTMENT_OTHER)
Admission: RE | Admit: 2011-06-06 | Discharge: 2011-06-06 | Disposition: A | Discharge status: Home / Self Care | Payer: 59 | Source: Ambulatory Visit | Attending: Orthopedic Surgery | Admitting: Orthopedic Surgery

## 2011-06-06 DIAGNOSIS — M7710 Lateral epicondylitis, unspecified elbow: Secondary | ICD-10-CM | POA: Insufficient documentation

## 2011-06-06 DIAGNOSIS — Z01812 Encounter for preprocedural laboratory examination: Secondary | ICD-10-CM | POA: Insufficient documentation

## 2011-06-06 DIAGNOSIS — M24429 Recurrent dislocation, unspecified elbow: Secondary | ICD-10-CM | POA: Insufficient documentation

## 2011-06-06 HISTORY — PX: TENDON REPAIR: SHX5111

## 2011-06-06 LAB — POCT HEMOGLOBIN-HEMACUE: Hemoglobin: 14.8 g/dL (ref 13.0–17.0)

## 2011-06-07 NOTE — Telephone Encounter (Signed)
Spoke with cathy at dr murphy's office. They no longer need clearance, they have what they need Zachary [name]

## 2011-06-10 ENCOUNTER — Other Ambulatory Visit: Payer: Self-pay | Admitting: Internal Medicine

## 2011-06-10 NOTE — Telephone Encounter (Signed)
Rx refill sent to pharmacy. Patient is due office visit

## 2011-06-13 NOTE — Op Note (Signed)
  NAME:  Zachary [name], GAPPA NO.:  [id number]  MEDICAL RECORD NO.:  [id number]  LOCATION:                                 FACILITY:  PHYSICIAN:  Loreta Ave, M.D. DATE OF BIRTH:  [birth year]  DATE OF PROCEDURE:  06/06/2011 DATE OF DISCHARGE:                              OPERATIVE REPORT   PREOPERATIVE DIAGNOSIS:  Right elbow, chronic lateral epicondylitis with tearing of extensor carpi radialis brevis tendon.  POSTOPERATIVE DIAGNOSIS:  Right elbow, chronic lateral epicondylitis with tearing of extensor carpi radialis brevis tendon.  PROCEDURE:  Right elbow exploration with debridement of degenerative torn ECRB tendon.  Joint inspection, epicondylar debridement and drilling and repair of superficial extensors over defect.  SURGEON:  Loreta Ave, MD  ASSISTANT:  Genene Churn. Barry Dienes, [place]  ANESTHESIA:  General  BLOOD LOSS:  Minimal.  SPECIMENS:  None.  CULTURES:  None.  COMPLICATIONS:  None.  DRESSING:  Soft compressive with a sugar-tong splint.  PROCEDURE:  The patient was brought to the operating room, placed on the operating table in supine position.  After adequate anesthesia had been obtained, the elbow examined.  Good motion, good stability.  Tourniquet applied, prepped and draped in usual sterile fashion.  Exsanguinated with elevation and Esmarch, tourniquet inflated to 250 mmHg.  A longitudinal incision from the epicondyle distal.  Skin and subcutaneous tissues were divided.  Superficial extensors intact, divided longitudinally.  Revealing marked mucinous degeneration tearing ECRB tendon from the epicondyle down the radial head including the degenerative torn capsule below.  All abnormal tissue excised.  Joint inspected.  No instability or chondromalacia.  Wound irrigated. Epicondyle debrided and treated with multiple drilling.  Wound irrigated.  Superficial extensors were closed over the defect with a running Vicryl.  Skin and subcutaneous  tissue with Vicryl.  Margins were injected with Marcaine.  Steri-Strips applied.  Long-arm splint applied.  Tourniquet deflated and removed.  Anesthesia reversed.  Brought to the recovery room.  Tolerated surgery well.  No complications.     Loreta Ave, M.D.     DFM/MEDQ  D:  06/06/2011  T:  06/07/2011  Job:  [id number]  Electronically Signed by Mckinley Jewel M.D. on 06/13/2011 04:08:47 PM

## 2011-06-18 ENCOUNTER — Ambulatory Visit: Payer: 59 | Attending: Orthopedic Surgery | Admitting: Physical Therapy

## 2011-06-18 DIAGNOSIS — M25539 Pain in unspecified wrist: Secondary | ICD-10-CM | POA: Insufficient documentation

## 2011-06-18 DIAGNOSIS — M7710 Lateral epicondylitis, unspecified elbow: Secondary | ICD-10-CM | POA: Insufficient documentation

## 2011-06-18 DIAGNOSIS — M25639 Stiffness of unspecified wrist, not elsewhere classified: Secondary | ICD-10-CM | POA: Insufficient documentation

## 2011-06-18 DIAGNOSIS — IMO0001 Reserved for inherently not codable concepts without codable children: Secondary | ICD-10-CM | POA: Insufficient documentation

## 2011-06-25 ENCOUNTER — Ambulatory Visit: Payer: 59 | Attending: Orthopedic Surgery | Admitting: Rehabilitation

## 2011-06-25 ENCOUNTER — Ambulatory Visit: Payer: 59 | Admitting: Pulmonary Disease

## 2011-06-25 DIAGNOSIS — IMO0001 Reserved for inherently not codable concepts without codable children: Secondary | ICD-10-CM | POA: Insufficient documentation

## 2011-06-25 DIAGNOSIS — M25539 Pain in unspecified wrist: Secondary | ICD-10-CM | POA: Insufficient documentation

## 2011-06-25 DIAGNOSIS — M25639 Stiffness of unspecified wrist, not elsewhere classified: Secondary | ICD-10-CM | POA: Insufficient documentation

## 2011-06-25 DIAGNOSIS — M7710 Lateral epicondylitis, unspecified elbow: Secondary | ICD-10-CM | POA: Insufficient documentation

## 2011-06-27 ENCOUNTER — Ambulatory Visit: Payer: 59 | Admitting: Physical Therapy

## 2011-06-28 ENCOUNTER — Encounter: Payer: Self-pay | Admitting: Pulmonary Disease

## 2011-07-02 ENCOUNTER — Ambulatory Visit: Payer: 59 | Admitting: Physical Therapy

## 2011-07-04 ENCOUNTER — Ambulatory Visit: Payer: 59 | Admitting: Physical Therapy

## 2011-07-09 ENCOUNTER — Encounter: Payer: Self-pay | Admitting: Pulmonary Disease

## 2011-07-09 ENCOUNTER — Ambulatory Visit (INDEPENDENT_AMBULATORY_CARE_PROVIDER_SITE_OTHER): Payer: 59 | Admitting: Pulmonary Disease

## 2011-07-09 VITALS — BP 116/60 | HR 55 | Temp 98.0°F | Ht 68.0 in | Wt 193.0 lb

## 2011-07-09 DIAGNOSIS — G4733 Obstructive sleep apnea (adult) (pediatric): Secondary | ICD-10-CM

## 2011-07-09 NOTE — Progress Notes (Signed)
  Subjective:    Patient ID: Zachary [name], male    DOB: [birth year], 60 y.o.   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 for evaluation of excessive tiredness & somnolence  ESS 21 /24  Loud snoring , wife sleeps in different bedroom, witnessed apneas  He also reports difficulty in maintaining sleep, obtains 3-4 h sleep, non refreshing  Tired all the time, during conversation,1 incident while driving, woke up gasping while watching TV  Various OTC sleep aids have not helped.  Cath neg 2010  Bedtime is 9.30 p, latency 20 mins, 3-4 awakenings, inability to fall back asleep about once/wk, oob at 0440 feeling tired  PSG showed mild obstructive sleep apnea , predmominat RERAs s/o UARS with RDI 11/h.He was desensitized with medium nasal mask. Some bruxism was noted   07/09/2011 Started on autoCPAP 5-12 , nasal with good results, download shows good compliance. He feels better rested, mask Ok, pressure OK, has adjusted humidity. There is no history suggestive of cataplexy, sleep paralysis or parasomnias    Review of Systems Pt denies any significant  nasal congestion or excess secretions, fever, chills, sweats, unintended wt loss, pleuritic or exertional cp, orthopnea pnd or leg swelling.  Pt also denies any obvious fluctuation in symptoms with weather or environmental change or other alleviating or aggravating factors.    Pt denies any increase in rescue therapy over baseline, denies waking up needing it or having early am exacerbations or coughing/wheezing/ or dyspnea       Objective:   Physical Exam Gen. Pleasant, well-nourished, in no distress ENT - no lesions, no post nasal drip Neck: No JVD, no thyromegaly, no carotid bruits Lungs: no use of accessory muscles, no dullness to percussion, clear without rales or rhonchi  Cardiovascular: Rhythm regular, heart sounds  normal, no murmurs or gallops, no peripheral edema Musculoskeletal: No deformities, no cyanosis or clubbing           Assessment & Plan:

## 2011-07-09 NOTE — Assessment & Plan Note (Signed)
Review download & change to fixed CPAP pressure. Compliance with goal of at least 4-6 hrs every night is the expectation. Advised against medications with sedative side effects Cautioned against driving when sleepy - understanding that sleepiness will vary on a day to day basis

## 2011-07-09 NOTE — Patient Instructions (Signed)
Expectation to use CPAP at least 6 hrs every night to have full benefit

## 2011-07-15 ENCOUNTER — Other Ambulatory Visit: Payer: Self-pay | Admitting: Internal Medicine

## 2011-07-15 NOTE — Telephone Encounter (Signed)
Rx refill sent to pharmacy. 

## 2011-07-16 ENCOUNTER — Ambulatory Visit: Payer: 59 | Admitting: Rehabilitation

## 2011-07-18 ENCOUNTER — Ambulatory Visit: Payer: 59 | Admitting: Physical Therapy

## 2011-07-23 ENCOUNTER — Ambulatory Visit: Payer: 59 | Admitting: Rehabilitation

## 2011-07-25 ENCOUNTER — Ambulatory Visit: Payer: 59 | Attending: Orthopedic Surgery | Admitting: Physical Therapy

## 2011-07-25 DIAGNOSIS — M25539 Pain in unspecified wrist: Secondary | ICD-10-CM | POA: Insufficient documentation

## 2011-07-25 DIAGNOSIS — M25639 Stiffness of unspecified wrist, not elsewhere classified: Secondary | ICD-10-CM | POA: Insufficient documentation

## 2011-07-25 DIAGNOSIS — M7710 Lateral epicondylitis, unspecified elbow: Secondary | ICD-10-CM | POA: Insufficient documentation

## 2011-07-25 DIAGNOSIS — IMO0001 Reserved for inherently not codable concepts without codable children: Secondary | ICD-10-CM | POA: Insufficient documentation

## 2011-07-30 ENCOUNTER — Encounter: Payer: 59 | Admitting: Rehabilitation

## 2011-08-01 ENCOUNTER — Ambulatory Visit: Payer: 59 | Admitting: Rehabilitation

## 2011-08-05 ENCOUNTER — Other Ambulatory Visit: Payer: Self-pay | Admitting: Internal Medicine

## 2011-08-05 DIAGNOSIS — I10 Essential (primary) hypertension: Secondary | ICD-10-CM

## 2011-08-05 DIAGNOSIS — N401 Enlarged prostate with lower urinary tract symptoms: Secondary | ICD-10-CM

## 2011-08-05 DIAGNOSIS — E039 Hypothyroidism, unspecified: Secondary | ICD-10-CM

## 2011-08-05 LAB — BASIC METABOLIC PANEL
CO2: 25 mEq/L (ref 19–32)
Chloride: 102 mEq/L (ref 96–112)
Glucose, Bld: 79 mg/dL (ref 70–99)
Potassium: 4.5 mEq/L (ref 3.5–5.3)
Sodium: 137 mEq/L (ref 135–145)

## 2011-08-06 ENCOUNTER — Ambulatory Visit: Payer: 59 | Admitting: Physical Therapy

## 2011-08-06 ENCOUNTER — Telehealth: Payer: Self-pay | Admitting: Pulmonary Disease

## 2011-08-06 ENCOUNTER — Telehealth: Payer: Self-pay

## 2011-08-06 ENCOUNTER — Other Ambulatory Visit: Payer: Self-pay

## 2011-08-06 DIAGNOSIS — G4733 Obstructive sleep apnea (adult) (pediatric): Secondary | ICD-10-CM

## 2011-08-06 MED ORDER — FINASTERIDE 5 MG PO TABS: 5.0000 mg | ORAL_TABLET | Freq: Every day | ORAL | Status: DC

## 2011-08-06 NOTE — Telephone Encounter (Signed)
Wanted to refill pts meds with 90 day supply instead of 30 day. Ok'd 90 day supply with no refills. Spoke with [employer] [facility]

## 2011-08-06 NOTE — Telephone Encounter (Signed)
Download 6/14- 07/05/11 >> shows good compliance avg pr 11 cm, no residual events, no leak Pl send order to DME to change to fixed pr 11 cm

## 2011-08-07 NOTE — Telephone Encounter (Signed)
Pt informed order sent. 

## 2011-08-08 ENCOUNTER — Ambulatory Visit: Payer: 59 | Admitting: Rehabilitation

## 2011-08-08 ENCOUNTER — Encounter: Payer: Self-pay | Admitting: Pulmonary Disease

## 2011-08-12 ENCOUNTER — Ambulatory Visit (INDEPENDENT_AMBULATORY_CARE_PROVIDER_SITE_OTHER): Payer: 59 | Admitting: Internal Medicine

## 2011-08-12 ENCOUNTER — Encounter: Payer: Self-pay | Admitting: Internal Medicine

## 2011-08-12 ENCOUNTER — Ambulatory Visit: Payer: Commercial Managed Care - PPO | Admitting: Internal Medicine

## 2011-08-12 DIAGNOSIS — E785 Hyperlipidemia, unspecified: Secondary | ICD-10-CM

## 2011-08-12 DIAGNOSIS — M549 Dorsalgia, unspecified: Secondary | ICD-10-CM

## 2011-08-12 DIAGNOSIS — I10 Essential (primary) hypertension: Secondary | ICD-10-CM

## 2011-08-12 DIAGNOSIS — E039 Hypothyroidism, unspecified: Secondary | ICD-10-CM

## 2011-08-12 MED ORDER — CYCLOBENZAPRINE HCL 10 MG PO TABS: 10.0000 mg | ORAL_TABLET | Freq: Three times a day (TID) | ORAL | Status: DC | PRN

## 2011-08-12 MED ORDER — DICLOFENAC SODIUM 75 MG PO TBEC: 75.0000 mg | DELAYED_RELEASE_TABLET | Freq: Two times a day (BID) | ORAL | Status: DC | PRN

## 2011-08-12 NOTE — Assessment & Plan Note (Signed)
Attempt nsaid with food and no other nsaids as well as muscle relaxer prn. Cautioned re: possible sedating effect. Followup if no improvement or worsening.

## 2011-08-12 NOTE — Assessment & Plan Note (Signed)
Stable. Continue current dosing. Obtain tsh and free t4 with 25m f/u.

## 2011-08-12 NOTE — Progress Notes (Signed)
  Subjective:    Patient ID: Zachary [name], male    DOB: 07-Nov-1951, 60 y.o.   MRN: [id number]  HPI Pt presents to clinic for followup of multiple medical problems. bp log reviewed and intermittent has low nl bp without dizziness or other sx's. Interested in ceased beta blocker. Notes ten day h/o right lbp after yard work. No leg paresthesias or weakness. Has intermittent flare with h/o lumbar back surgery in the past. Attempted heat/cold and otc medication without improvement. Tolerates statin therapy without myalgias or abn lft.  No other complaints.   Past Medical History  Diagnosis Date  . Hyperlipidemia   . Hypertension   . Insomnia   . Hypothyroidism   . GERD (gastroesophageal reflux disease)   . Plantar fasciitis   . Arthritis   . Hemorrhoids   . Hearing problem     hearing deficit  . Diverticulitis    Past Surgical History  Procedure Date  . Hemorrhoid surgery 2007  . Back surgery 1978    herniated disksurgery  . Tendon repair June 06, 2011    right elbow, Dr. Eulah Pont    reports that he has never smoked. He has never used smokeless tobacco. He reports that he drinks alcohol. He reports that he does not use illicit drugs. family history includes Alcohol abuse in his other; Arthritis in his other; Breast cancer in his mother; Cancer in his brother and other; Coronary artery disease in his other; Cystic fibrosis in his other; Heart disease in his father; and Irritable bowel syndrome in his other. Allergies  Allergen Reactions  . Simvastatin     REACTION: muscle pain    Review of Systems see hpi     Objective:   Physical Exam  Physical Exam  Nursing note and vitals reviewed. Constitutional: Appears well-developed and well-nourished. No distress.  HENT:  Head: Normocephalic and atraumatic.  Right Ear: External ear normal.  Left Ear: External ear normal.  Eyes: Conjunctivae are normal. No scleral icterus.  Neck: Neck supple. Carotid bruit is not present.    Cardiovascular: Normal rate, regular rhythm and normal heart sounds.  Exam reveals no gallop and no friction rub.   No murmur heard. Pulmonary/Chest: Effort normal and breath sounds normal. No respiratory distress. He has no wheezes. no rales.  MSK: mild tenderness of right paraspinal back muscle. Gait nl Neurological:Alert.  Skin: Skin is warm and dry. Not diaphoretic.  Psychiatric: Has a normal mood and affect.        Assessment & Plan:

## 2011-08-12 NOTE — Assessment & Plan Note (Signed)
Stable. Obtain lipid/lft 

## 2011-08-12 NOTE — Patient Instructions (Signed)
Please schedule fasting labs for Weds am (8/22) lipid/lft 272.4 Also schedule fasting labs prior to next appt chem7 (v58.69) lipid/lft (272.4) and tsh/free t4 (hypothyroidism)

## 2011-08-12 NOTE — Assessment & Plan Note (Signed)
Low nl control. Wean betablocker off with 1/2 dose qd x 1 week then 1/2 dose qod x 1 week.

## 2011-08-13 ENCOUNTER — Ambulatory Visit: Payer: 59 | Admitting: Rehabilitation

## 2011-08-14 ENCOUNTER — Other Ambulatory Visit: Payer: Self-pay | Admitting: Internal Medicine

## 2011-08-14 DIAGNOSIS — E785 Hyperlipidemia, unspecified: Secondary | ICD-10-CM

## 2011-08-14 LAB — LIPID PANEL
Cholesterol: 159 mg/dL (ref 0–200)
HDL: 41 mg/dL (ref >39)
Total CHOL/HDL Ratio: 3.9 Ratio
Triglycerides: 134 mg/dL (ref <150)
VLDL: 27 mg/dL (ref 0–40)

## 2011-08-14 LAB — HEPATIC FUNCTION PANEL
ALT: 27 U/L (ref 0–53)
AST: 25 U/L (ref 0–37)
Albumin: 4.6 g/dL (ref 3.5–5.2)
Bilirubin, Direct: 0.1 mg/dL (ref 0.0–0.3)
Total Protein: 7 g/dL (ref 6.0–8.3)

## 2011-08-15 ENCOUNTER — Ambulatory Visit: Payer: 59 | Admitting: Rehabilitation

## 2011-08-19 ENCOUNTER — Encounter: Payer: 59 | Admitting: Rehabilitation

## 2011-08-22 ENCOUNTER — Ambulatory Visit: Payer: 59 | Admitting: Physical Therapy

## 2011-08-27 ENCOUNTER — Ambulatory Visit: Payer: 59 | Attending: Orthopedic Surgery | Admitting: Physical Therapy

## 2011-08-27 DIAGNOSIS — IMO0001 Reserved for inherently not codable concepts without codable children: Secondary | ICD-10-CM | POA: Insufficient documentation

## 2011-08-27 DIAGNOSIS — M7710 Lateral epicondylitis, unspecified elbow: Secondary | ICD-10-CM | POA: Insufficient documentation

## 2011-08-27 DIAGNOSIS — M25639 Stiffness of unspecified wrist, not elsewhere classified: Secondary | ICD-10-CM | POA: Insufficient documentation

## 2011-08-27 DIAGNOSIS — M25539 Pain in unspecified wrist: Secondary | ICD-10-CM | POA: Insufficient documentation

## 2011-08-28 ENCOUNTER — Other Ambulatory Visit: Payer: Self-pay | Admitting: Internal Medicine

## 2011-08-28 DIAGNOSIS — Z79899 Other long term (current) drug therapy: Secondary | ICD-10-CM

## 2011-08-28 DIAGNOSIS — E039 Hypothyroidism, unspecified: Secondary | ICD-10-CM

## 2011-08-28 DIAGNOSIS — E785 Hyperlipidemia, unspecified: Secondary | ICD-10-CM

## 2011-08-29 ENCOUNTER — Ambulatory Visit: Payer: 59 | Admitting: Rehabilitation

## 2011-09-05 ENCOUNTER — Telehealth: Payer: Self-pay | Admitting: Pulmonary Disease

## 2011-09-05 DIAGNOSIS — G4733 Obstructive sleep apnea (adult) (pediatric): Secondary | ICD-10-CM

## 2011-09-05 NOTE — Telephone Encounter (Signed)
Good compliance on download upto 08/26/11 Change to CPPA 11 cm

## 2011-09-13 ENCOUNTER — Other Ambulatory Visit: Payer: Self-pay | Admitting: Internal Medicine

## 2011-09-13 ENCOUNTER — Encounter: Payer: Self-pay | Admitting: Pulmonary Disease

## 2011-09-13 NOTE — Telephone Encounter (Signed)
Verbal refill given to Festus Barren at [facility] pharmacy #90 x 1 refill.

## 2011-10-10 ENCOUNTER — Other Ambulatory Visit: Payer: Self-pay | Admitting: Internal Medicine

## 2011-10-11 NOTE — Telephone Encounter (Signed)
Rx refill sent to pharmacy. 

## 2011-11-01 ENCOUNTER — Other Ambulatory Visit: Payer: Self-pay | Admitting: Family

## 2011-11-25 NOTE — Telephone Encounter (Signed)
  Phone Note Outgoing Call Call back at [facility] Phone [phone]   Call placed by: Emilio Math,  May 24, 2011 12:53 PM Call placed to: Patient Summary of Call: Knee is better using ice packs, patches are working very well, hasn't started exercises.

## 2011-11-25 NOTE — Letter (Signed)
Summary: Out of Work  [facility]  [address]   [place], [place] [address]   Phone: [phone]  Fax: [phone]    May 18, 2011   Employee:  Zachary [name] [facility] [employer]    To Whom It May Concern:   For Medical reasons, please excuse the above named employee from work tomorrow and 05/20/11.   If you need additional information, please feel free to contact our office.         Sincerely,    Donna Christen MD

## 2011-11-25 NOTE — Progress Notes (Signed)
Summary: knee injury/TM (room 4)   Vital Signs:  Patient Profile:   60 Years Old Male CC:      right knee injury/pain Height:     68 inches (172.72 cm) Weight:      194 pounds O2 Sat:      96 % O2 treatment:    RRoom Air Temp:     98.4 degrees F oral Resp:     16 per minute BP sitting:   114 / 76  (left arm) Cuff size:   regular  Pt. in pain?   yes    Location:   right knee    Intensity:   7    Type:       sharp  Vitals Entered By: Lavell Islam RN (May 18, 2011 1:37 PM)                   Updated Prior Medication List: LISINOPRIL 5 MG TABS (LISINOPRIL) Take one tablet by mouth daily OMEPRAZOLE-SODIUM BICARBONATE 40-1100 MG CAPS (OMEPRAZOLE-SODIUM BICARBONATE) one by mouth once daily PRAVASTATIN SODIUM 40 MG TABS (PRAVASTATIN SODIUM) take 1/2 tablet daily MULTIVITAMINS  TABS (MULTIPLE VITAMIN) Take 1 tablet by mouth once a day FISH OIL 1200 MG CAPS (OMEGA-3 FATTY ACIDS) 2 capsules by mouth once daily LEVOTHYROXINE SODIUM 100 MCG TABS (LEVOTHYROXINE SODIUM) one by mouth once daily ASPIRIN 81 MG TBEC (ASPIRIN) Take 1 tablet by mouth once a day LORATADINE 10 MG TABS (LORATADINE) Take 1 tablet by mouth once a day NITROSTAT 0.4 MG SUBL (NITROGLYCERIN) 1 tablet under tongue at onset of chest pain; you may repeat every 5 minutes for up to 3 doses. BISOPROLOL FUMARATE 5 MG TABS (BISOPROLOL FUMARATE) 1/2 tab by mouth once daily HYDROCORTISONE 2.5 % CREA (HYDROCORTISONE) apply 4 x per day for 1 week  Current Allergies (reviewed today): ! SIMVASTATINHistory of Present Illness Chief Complaint: right knee injury/pain History of Present Illness:  Subjective:  Patient complains of sudden onset right knee pain while walking in yard about two hours prior.  No known injury.  Pain is worse when knee is extended, and when walking and bearing weight.  No recent changes in activities.  He walks for about every day on treadmill  REVIEW OF SYSTEMS Constitutional Symptoms  Denies fever, chills, night sweats, weight loss, weight gain, and fatigue.  Eyes       Denies change in vision, eye pain, eye discharge, glasses, contact lenses, and eye surgery. Ear/Nose/Throat/Mouth       Denies hearing loss/aids, change in hearing, ear pain, ear discharge, dizziness, frequent runny nose, frequent nose bleeds, sinus problems, sore throat, hoarseness, and tooth pain or bleeding.  Respiratory       Denies dry cough, productive cough, wheezing, shortness of breath, asthma, bronchitis, and emphysema/COPD.  Cardiovascular       Denies murmurs, chest pain, and tires easily with exhertion.    Gastrointestinal       Denies stomach pain, nausea/vomiting, diarrhea, constipation, blood in bowel movements, and indigestion. Genitourniary       Denies painful urination, kidney stones, and loss of urinary control. Neurological       Denies paralysis, seizures, and fainting/blackouts. Musculoskeletal       Complains of joint pain.      Denies muscle pain, joint stiffness, decreased range of motion, redness, swelling, muscle weakness, and gout.      Comments: spontaneous onset right knee pain Skin       Denies bruising, unusual mles/lumps or sores, and  hair/skin or nail changes.  Psych       Denies mood changes, temper/anger issues, anxiety/stress, speech problems, depression, and sleep problems. 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 HYPERTENSION HYPERLIPIDEMIA  FATIGUE, CHRONIC    PLANTAR FASCIITIS  INSOMNIA, CHRONIC HYPOTHYROIDISM  NEOPLASM OF UNCERTAIN BEHAVIOR OF SKIN  HEMORRHOIDS OSTEOARTHRITIS  HEARING DEFICIT PROTEINURIA  GERD DIVERTICULITIS, HX OF  Past Surgical History: Reviewed history from 02/12/2011 and no changes required. herniated disc surgery 1978  Hemorrhoidectomy cyst removal            Family History: Reviewed history from 02/12/2011 and no changes required. Family History of Alcoholism/Addiction Family History of Arthritis Family History of  breast cancer Family History of  prostate cancer  Family History of  CAD          Family History of Irritable Bowel Syndrome: Family History of Cystic Fibrosis:      Social History: Reviewed history from 02/12/2011 and no changes required. 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  Knee stable, negative drawer test.  McMurray test negative, although he has pain localizing to the lateral joint line.  There is tenderness over the lateral tibial plateau.  Has relatively good range of motion, but with hesitation.  Distal neurovascular intact  X-ray right knee:   IMPRESSION: Calcifications of the menisci.  Small joint effusion. Assessment New Problems: KNEE SPRAIN, ACUTE (ICD-844.9) KNEE PAIN, RIGHT, ACUTE (ICD-719.46)  ? MILD NON-SPECIFIC SPRAIN  Plan New Medications/Changes: FLECTOR 1.3 % PTCH (DICLOFENAC EPOLAMINE) Apply one-half to one patch to painful area bid  #15 x 0, 05/18/2011, Donna Christen MD  New Orders: T-DG Knee Complete 4 Views*R* [40981] Services provided After  hours-Weekends-Holidays [99051] Est. Patient Level III [99213] Planning Comments:   Continue elastic knee brace.  Apply ice pack for 30 to 45 minutes every 1 to 4 hours.  Continue until swelling decreases.  Continue crutches 5 to 7 days until pain decreases.  Rx for Flector patch. Begin knee exercises in about 5 days ([facility] information and instruction patient handout given)  Follow-up with orthopedist if not improving 10 to 14 days.   The patient and/or caregiver has been counseled thoroughly with regard to medications prescribed including dosage, schedule, interactions, rationale for use, and possible side effects and they verbalize understanding.  Diagnoses and expected course of recovery discussed and will return if not improved as expected or if the condition worsens. Patient and/or caregiver verbalized understanding.  Prescriptions: FLECTOR 1.3 % PTCH (DICLOFENAC EPOLAMINE) Apply one-half to one patch to  painful area bid  #15 x 0   Entered and Authorized by:   Donna Christen MD   Signed by:   Donna Christen MD on 05/18/2011   Method used:   Print then Give to Patient   RxID:   [id number]   Orders Added: 1)  T-DG Knee Complete 4 Views*R* [14782] 2)  Services provided After hours-Weekends-Holidays [99051] 3)  Est. Patient Level III [95621]

## 2012-01-09 ENCOUNTER — Ambulatory Visit (HOSPITAL_BASED_OUTPATIENT_CLINIC_OR_DEPARTMENT_OTHER)
Admission: RE | Admit: 2012-01-09 | Discharge: 2012-01-09 | Disposition: A | Discharge status: Home / Self Care | Payer: 59 | Source: Ambulatory Visit | Attending: Family Medicine | Admitting: Family Medicine

## 2012-01-09 ENCOUNTER — Encounter: Payer: Self-pay | Admitting: Family Medicine

## 2012-01-09 ENCOUNTER — Ambulatory Visit (INDEPENDENT_AMBULATORY_CARE_PROVIDER_SITE_OTHER): Payer: 59 | Admitting: Family Medicine

## 2012-01-09 VITALS — BP 123/80 | HR 74 | Temp 98.1°F | Ht 68.0 in | Wt 197.0 lb

## 2012-01-09 DIAGNOSIS — S99912A Unspecified injury of left ankle, initial encounter: Secondary | ICD-10-CM

## 2012-01-09 DIAGNOSIS — S99929A Unspecified injury of unspecified foot, initial encounter: Secondary | ICD-10-CM

## 2012-01-09 DIAGNOSIS — S8990XA Unspecified injury of unspecified lower leg, initial encounter: Secondary | ICD-10-CM | POA: Insufficient documentation

## 2012-01-09 DIAGNOSIS — R209 Unspecified disturbances of skin sensation: Secondary | ICD-10-CM

## 2012-01-09 DIAGNOSIS — X500XXA Overexertion from strenuous movement or load, initial encounter: Secondary | ICD-10-CM | POA: Insufficient documentation

## 2012-01-09 NOTE — Patient Instructions (Signed)
You have an ankle sprain. Ice the area for 15 minutes at a time, 3-4 times a day Take aleve 1-2 tabs twice a day with food for 1 week then as needed. Elevate above the level of your heart when possible Crutches if needed to help with walking Bear weight when tolerated Depending on severity your doctor will place you in an ACE wrap with a walking boot OR a laceup ankle brace to help with stability while you recover from this injury. Come out of the boot/brace twice a day to do Up/down and alphabet exercises 2-3 sets of each. Start theraband exercises for strengthening when pain is tolerable Consider physical therapy for strengthening and balance exercises. If not improving as expected, we may repeat x-rays or consider further testing like an MRI.

## 2012-01-10 ENCOUNTER — Encounter: Payer: Self-pay | Admitting: Family Medicine

## 2012-01-10 NOTE — Progress Notes (Signed)
Subjective:    Patient ID: Zachary [name], male    DOB: February 19, 1951, 61 y.o.   MRN: [id number]  PCP: None listed  HPI 61 yo M here for left ankle injury.  Patient reports about 1 week ago he was up on a ladder about 3 feet high washing a car when he fell off onto left leg and rolled on ground. No pop felt or heard at the time. Had pain lateral ankle when this happened - thinks he may have rolled it. Has history of ankle fracture but unsure which side. Pain has been off and on past week but more persistent today. Mild swelling but no bruising.  Past Medical History  Diagnosis Date  . Hyperlipidemia   . Hypertension   . Insomnia   . Hypothyroidism   . GERD (gastroesophageal reflux disease)   . Arthritis   . Hemorrhoids   . Hearing problem     hearing deficit  . Diverticulitis     Current Outpatient Prescriptions on File Prior to Visit  Medication Sig Dispense Refill  . aspirin EC 81 MG EC tablet Take 81 mg by mouth daily.        . finasteride (PROSCAR) 5 MG tablet TAKE 1 TABLET BY MOUTH ONCE DAILY  90 tablet  0  . levothyroxine (SYNTHROID, LEVOTHROID) 100 MCG tablet TAKE ONE TABLET BY MOUTH ONCE DAILY "PT IS DUE FOR OFFICE VISIT"  90 tablet  1  . lisinopril (PRINIVIL,ZESTRIL) 5 MG tablet Take 1 tablet (5 mg total) by mouth daily.  90 tablet  3  . loratadine (CLARITIN) 10 MG tablet Take 10 mg by mouth daily.        . Multiple Vitamin (MULTIVITAMINS PO) Take 1 tablet by mouth daily.        . Omega-3 Fatty Acids (FISH OIL) 1200 MG CAPS Take 2 capsules by mouth daily.        Marland Kitchen omeprazole-sodium bicarbonate (ZEGERID) 40-1100 MG per capsule TAKE 1 CAPSULE BY MOUTH ONCE DAILY  90 capsule  0  . pravastatin (PRAVACHOL) 40 MG tablet TAKE 1/2 TABLET (20 MG TOTAL) BY MOUTH DAILY.  45 tablet  1    Past Surgical History  Procedure Date  . Hemorrhoid surgery 2007  . Back surgery 1978    herniated disksurgery  . Tendon repair June 06, 2011    right elbow, Dr. Eulah Pont    Allergies    Allergen Reactions  . Simvastatin     REACTION: muscle pain    History   Social History  . Marital Status: Married    Spouse Name: Zachary [name]    Number of Children: 2  . 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 Smoking status: Never Smoker   . Smokeless tobacco: Never Used  . Alcohol Use: Yes  . Drug Use: No  . Sexually Active: Not on file   Other Topics Concern  . Not on file   Social History Narrative   Previously Loss adjuster, chartered [employer]    Family History  Problem Relation Age of Onset  . Heart disease Father   . Breast cancer Mother   . Cancer Brother   . Alcohol abuse Other   . Arthritis Other   . Cancer Other     Breast, Prostate  . Coronary artery disease Other   . Irritable bowel syndrome Other   . Cystic fibrosis Other  BP 123/80  Pulse 74  Temp(Src) 98.1 F (36.7 C) (Oral)  Ht 5\' 8"  (1.727 m)  Wt 197 lb (89.359 kg)  BMI 29.95 kg/m2  Review of Systems See HPI above.    Objective:   Physical Exam Gen: NAD  L ankle: Mild swelling surrounding lateral malleolus.  No bruising, erythema, deformity. FROM with 5/5 strength all directions. TTP lateral malleolus, less so around this.  No other TTP about foot/ankle. 2+ talar tilt, trace ant drawer Negative syndesmotic compression. Thompsons test negative. NV intact distally.    Assessment & Plan:  1. Left ankle injury - radiographs negative for fracture of lateral malleolus.  Pain 2/2 ankle sprain.  Reassured patient - shown home rehab exercises.  Discussed icing, elevation, nsaids, relative rest.  If not improving consider repeat imaging, formal PT.  Declined ASO today.

## 2012-01-10 NOTE — Assessment & Plan Note (Signed)
radiographs negative for fracture of lateral malleolus.  Pain 2/2 ankle sprain.  Reassured patient - shown home rehab exercises.  Discussed icing, elevation, nsaids, relative rest.  If not improving consider repeat imaging, formal PT.  Declined ASO today.

## 2012-02-03 ENCOUNTER — Other Ambulatory Visit: Payer: Self-pay | Admitting: *Deleted

## 2012-02-03 DIAGNOSIS — Z79899 Other long term (current) drug therapy: Secondary | ICD-10-CM

## 2012-02-03 DIAGNOSIS — E785 Hyperlipidemia, unspecified: Secondary | ICD-10-CM

## 2012-02-03 DIAGNOSIS — E039 Hypothyroidism, unspecified: Secondary | ICD-10-CM

## 2012-02-04 LAB — HEPATIC FUNCTION PANEL
Alkaline Phosphatase: 61 U/L (ref 39–117)
Bilirubin, Direct: 0.1 mg/dL (ref 0.0–0.3)
Indirect Bilirubin: 0.4 mg/dL (ref 0.0–0.9)
Total Bilirubin: 0.5 mg/dL (ref 0.3–1.2)
Total Protein: 7 g/dL (ref 6.0–8.3)

## 2012-02-04 LAB — LIPID PANEL
LDL Cholesterol: 112 mg/dL — ABNORMAL HIGH (ref 0–99)
VLDL: 15 mg/dL (ref 0–40)

## 2012-02-04 LAB — BASIC METABOLIC PANEL
BUN: 21 mg/dL (ref 6–23)
CO2: 30 mEq/L (ref 19–32)
Chloride: 104 mEq/L (ref 96–112)
Creat: 1.04 mg/dL (ref 0.50–1.35)
Glucose, Bld: 93 mg/dL (ref 70–99)
Potassium: 4.7 mEq/L (ref 3.5–5.3)

## 2012-02-04 LAB — T4, FREE: Free T4: 1.25 ng/dL (ref 0.80–1.80)

## 2012-02-07 ENCOUNTER — Ambulatory Visit (INDEPENDENT_AMBULATORY_CARE_PROVIDER_SITE_OTHER): Payer: 59 | Admitting: Internal Medicine

## 2012-02-07 ENCOUNTER — Encounter: Payer: Self-pay | Admitting: Internal Medicine

## 2012-02-07 VITALS — BP 112/82 | HR 73 | Temp 98.1°F | Resp 18 | Ht 68.0 in | Wt 203.0 lb

## 2012-02-07 DIAGNOSIS — I10 Essential (primary) hypertension: Secondary | ICD-10-CM

## 2012-02-07 DIAGNOSIS — E039 Hypothyroidism, unspecified: Secondary | ICD-10-CM

## 2012-02-07 DIAGNOSIS — E785 Hyperlipidemia, unspecified: Secondary | ICD-10-CM

## 2012-02-07 DIAGNOSIS — N63 Unspecified lump in unspecified breast: Secondary | ICD-10-CM

## 2012-02-07 MED ORDER — FINASTERIDE 5 MG PO TABS: 5.0000 mg | ORAL_TABLET | Freq: Every day | ORAL | Status: DC

## 2012-02-07 MED ORDER — OMEPRAZOLE-SODIUM BICARBONATE 40-1100 MG PO CAPS: 1.0000 | ORAL_CAPSULE | Freq: Every day | ORAL | Status: DC

## 2012-02-07 MED ORDER — LISINOPRIL 5 MG PO TABS: 5.0000 mg | ORAL_TABLET | Freq: Every day | ORAL | Status: DC

## 2012-02-07 MED ORDER — LEVOTHYROXINE SODIUM 100 MCG PO TABS: 100.0000 ug | ORAL_TABLET | Freq: Every day | ORAL | Status: DC

## 2012-02-07 NOTE — Patient Instructions (Signed)
We will schedule your left diagnostic mammogram at [facility]

## 2012-02-09 DIAGNOSIS — N63 Unspecified lump in unspecified breast: Secondary | ICD-10-CM | POA: Insufficient documentation

## 2012-02-09 NOTE — Assessment & Plan Note (Signed)
Stable. Continue current dosing of synthroid

## 2012-02-09 NOTE — Progress Notes (Signed)
  Subjective:    Patient ID: Zachary [name], male    DOB: Oct 18, 1951, 61 y.o.   MRN: [id number]  HPI Pt presents to clinic for followup of multiple medical problems. bp reviewed nl. Labs reviewed with pt. Tolerating statin tx without myalgias. Notes 2wk h/o left breast pain described as stinging. Pain worse with palpation. No nipple drainage. No other complaints.   Past Medical History  Diagnosis Date  . Hyperlipidemia   . Hypertension   . Insomnia   . Hypothyroidism   . GERD (gastroesophageal reflux disease)   . Arthritis   . Hemorrhoids   . Hearing problem     hearing deficit  . Diverticulitis    Past Surgical History  Procedure Date  . Hemorrhoid surgery 2007  . Back surgery 1978    herniated disksurgery  . Tendon repair June 06, 2011    right elbow, Dr. Eulah Pont    reports that he has never smoked. He has never used smokeless tobacco. He reports that he drinks alcohol. He reports that he does not use illicit drugs. family history includes Alcohol abuse in his other; Arthritis in his other; Breast cancer in his mother; Cancer in his brother and other; Coronary artery disease in his other; Cystic fibrosis in his other; Heart disease in his father; and Irritable bowel syndrome in his other. Allergies  Allergen Reactions  . Simvastatin     REACTION: muscle pain     Review of Systems see hpi     Objective:   Physical Exam  Physical Exam  Nursing note and vitals reviewed. Constitutional: Appears well-developed and well-nourished. No distress.  HENT:  Head: Normocephalic and atraumatic.  Right Ear: External ear normal.  Left Ear: External ear normal.  Eyes: Conjunctivae are normal. No scleral icterus.  Neck: Neck supple. Carotid bruit is not present.  Cardiovascular: Normal rate, regular rhythm and normal heart sounds.  Exam reveals no gallop and no friction rub.   No murmur heard. Pulmonary/Chest: Effort normal and breath sounds normal. No respiratory distress. He  has no wheezes. no rales.  Lymphadenopathy:    He has no cervical adenopathy.  Neurological:Alert.  Skin: Skin is warm and dry. Not diaphoretic.  Psychiatric: Has a normal mood and affect.  Breast: left breast exam with soft tissue density just above nipple. No nipple inversion or discharge. Density is tender. No axillary adenopathy      Assessment & Plan:

## 2012-02-09 NOTE — Assessment & Plan Note (Signed)
Stable. Continue statin dosing

## 2012-02-09 NOTE — Assessment & Plan Note (Signed)
Schedule breast imaging through radiology for dx mammogram and ?[place]. Close f/u scheduled

## 2012-02-09 NOTE — Assessment & Plan Note (Signed)
Normotensive and stable. Continue current regimen. Monitor bp as outpt and followup in clinic as scheduled.  

## 2012-02-10 ENCOUNTER — Ambulatory Visit (INDEPENDENT_AMBULATORY_CARE_PROVIDER_SITE_OTHER): Payer: 59 | Admitting: Internal Medicine

## 2012-02-10 ENCOUNTER — Telehealth: Payer: Self-pay | Admitting: *Deleted

## 2012-02-10 DIAGNOSIS — N63 Unspecified lump in unspecified breast: Secondary | ICD-10-CM

## 2012-02-10 NOTE — Telephone Encounter (Signed)
Order placed

## 2012-02-10 NOTE — Progress Notes (Signed)
Patient ID: Zachary [name], male   DOB: 1951/09/10, 61 y.o.   MRN: [id number] .

## 2012-02-10 NOTE — Telephone Encounter (Signed)
[facility] received from [facility] stating the order for mammogram needs to be replaced with a diagnostic bilateral and left breast ultrasound.

## 2012-02-10 NOTE — Telephone Encounter (Signed)
Orders entered

## 2012-02-17 ENCOUNTER — Ambulatory Visit
Admission: RE | Admit: 2012-02-17 | Discharge: 2012-02-17 | Disposition: A | Discharge status: Home / Self Care | Payer: 59 | Source: Ambulatory Visit | Attending: Internal Medicine | Admitting: Internal Medicine

## 2012-02-17 DIAGNOSIS — N63 Unspecified lump in unspecified breast: Secondary | ICD-10-CM

## 2012-02-28 ENCOUNTER — Ambulatory Visit: Payer: 59 | Admitting: Internal Medicine

## 2012-04-09 ENCOUNTER — Other Ambulatory Visit: Payer: Self-pay | Admitting: Internal Medicine

## 2012-04-09 NOTE — Telephone Encounter (Signed)
Rx refill sent to pharmacy. 

## 2012-06-15 ENCOUNTER — Emergency Department
Admission: EM | Admit: 2012-06-15 | Discharge: 2012-06-15 | Disposition: A | Discharge status: Home / Self Care | Payer: 59 | Source: Home / Self Care | Attending: Emergency Medicine | Admitting: Emergency Medicine

## 2012-06-15 ENCOUNTER — Encounter: Payer: Self-pay | Admitting: *Deleted

## 2012-06-15 DIAGNOSIS — L0390 Cellulitis, unspecified: Secondary | ICD-10-CM

## 2012-06-15 DIAGNOSIS — L03119 Cellulitis of unspecified part of limb: Secondary | ICD-10-CM

## 2012-06-15 MED ORDER — DOXYCYCLINE HYCLATE 100 MG PO CAPS: 100.0000 mg | ORAL_CAPSULE | Freq: Two times a day (BID) | ORAL | Status: AC

## 2012-06-15 NOTE — ED Notes (Signed)
Patient reports noticing a small wound on left lower leg x this am. Since the swelling has increased and has been draining.

## 2012-06-15 NOTE — ED Provider Notes (Signed)
History     CSN: [id number]  Arrival date & time 06/15/12  1620   First MD Initiated Contact with Patient 06/15/12 1633      Chief Complaint  Patient presents with  . Wound Infection    (Consider location/radiation/quality/duration/timing/severity/associated sxs/prior treatment) HPI This patient presents today with a small wound is lower leg since this morning.  He was outside working the yard but knows he was knocked around any poison ivy.  He wonders if he got bit by a bug.  He has noticed increased swelling and redness and some drainage since this morning.  Drainage is yellow or clear in color.  He does report some tenderness as well to that area.  He works at the [employer] [place].  No new soaps shampoos detergents, other tick or insect exposure, new medicines, recent travel etc.  No fever, chills, nausea, vomiting or any other constitutional symptoms.  Past Medical History  Diagnosis Date  . Hyperlipidemia   . Hypertension   . Insomnia   . Hypothyroidism   . GERD (gastroesophageal reflux disease)   . Arthritis   . Hemorrhoids   . Hearing problem     hearing deficit  . Diverticulitis     Past Surgical History  Procedure Date  . Hemorrhoid surgery 2007  . Back surgery 1978    herniated disksurgery  . Tendon repair June 06, 2011    right elbow, Dr. Eulah Pont    Family History  Problem Relation Age of Onset  . Heart disease Father   . Breast cancer Mother   . Cancer Brother   . Alcohol abuse Other   . Arthritis Other   . Cancer Other     Breast, Prostate  . Coronary artery disease Other   . Irritable bowel syndrome Other   . Cystic fibrosis Other     History  Substance Use Topics  . Smoking status: Never Smoker   . Smokeless tobacco: Never Used  . Alcohol Use: Yes      Review of Systems  All other systems reviewed and are negative.    Allergies  Simvastatin  Home Medications   Current Outpatient Rx  Name Route Sig Dispense  Refill  . ASPIRIN EC 81 MG PO TBEC Oral Take 81 mg by mouth daily.      Marland Kitchen DOXYCYCLINE HYCLATE 100 MG PO CAPS Oral Take 1 capsule (100 mg total) by mouth 2 (two) times daily. 14 capsule 0  . FINASTERIDE 5 MG PO TABS Oral Take 1 tablet (5 mg total) by mouth daily. 90 tablet 3  . LEVOTHYROXINE SODIUM 100 MCG PO TABS Oral Take 1 tablet (100 mcg total) by mouth daily. 90 tablet 3  . LISINOPRIL 5 MG PO TABS Oral Take 1 tablet (5 mg total) by mouth daily. 90 tablet 3  . LORATADINE 10 MG PO TABS Oral Take 10 mg by mouth daily.      . MULTIVITAMINS PO Oral Take 1 tablet by mouth daily.      Marland Kitchen FISH OIL 1200 MG PO CAPS Oral Take 2 capsules by mouth daily.      Marland Kitchen OMEPRAZOLE-SODIUM BICARBONATE 40-1100 MG PO CAPS Oral Take 1 capsule by mouth daily before breakfast. 90 capsule 3  . PRAVASTATIN SODIUM 40 MG PO TABS  TAKE 1/2 TABLET (20 MG TOTAL) BY MOUTH DAILY. 45 tablet 1    Dispense as written.    BP 121/81  Pulse 62  Temp 98.6 F (37 C) (Oral)  Resp 14  Ht 5\' 8"  (1.727 m)  Wt 195 lb (88.451 kg)  BMI 29.65 kg/m2  SpO2 96%  Physical Exam  Nursing note and vitals reviewed. Constitutional: He is oriented to person, place, and time. He appears well-developed and well-nourished.  HENT:  Head: Normocephalic and atraumatic.  Eyes: No scleral icterus.  Neck: Neck supple.  Cardiovascular: Regular rhythm and normal heart sounds.   Pulmonary/Chest: Effort normal and breath sounds normal. No respiratory distress.  Neurological: He is alert and oriented to person, place, and time.  Skin: Skin is warm and dry.          There is a 1 cm lesion with surrounding erythema approximately 4 x 6 cm.  There is minimal tenderness to palpation.  There is a clear discharge with a yellow crust.  No induration or fluctuance seen.  Distal neurovascular status is intact.  Psychiatric: He has a normal mood and affect. His speech is normal.    ED Course  Procedures (including critical care time)   Labs Reviewed    WOUND CULTURE   No results found.   1. Cellulitis       MDM   This patient appears to have a slight cellulitis of his left leg.  I took culture of the wound and will send to the lab.  It appears to be a mild staph and will place him on doxycycline for a week.  I've told to keep it clean and dry.  I also outlined wound and he should keep an eye on that for the next few days to make sure it is getting better.  Followup with PCP, dermatology if not improving or if worsening.  If worsening quickly and more severely, need to call clinic and talk to our nurse.  Marlaine Hind, MD 06/15/12 1700

## 2012-06-18 ENCOUNTER — Telehealth: Payer: Self-pay | Admitting: Family Medicine

## 2012-06-18 LAB — WOUND CULTURE: Organism ID, Bacteria: NO GROWTH

## 2012-07-27 ENCOUNTER — Other Ambulatory Visit: Payer: Self-pay | Admitting: *Deleted

## 2012-07-27 DIAGNOSIS — I10 Essential (primary) hypertension: Secondary | ICD-10-CM

## 2012-07-27 DIAGNOSIS — E785 Hyperlipidemia, unspecified: Secondary | ICD-10-CM

## 2012-07-27 DIAGNOSIS — E039 Hypothyroidism, unspecified: Secondary | ICD-10-CM

## 2012-07-27 NOTE — Progress Notes (Signed)
Per Vo TWJ, repeat of 02.2013 lab orders; done to lab/SLS

## 2012-07-28 LAB — TSH: TSH: 2.453 u[IU]/mL (ref 0.350–4.500)

## 2012-07-29 LAB — HEPATIC FUNCTION PANEL
ALT: 26 U/L (ref 0–53)
AST: 24 U/L (ref 0–37)
Albumin: 4.5 g/dL (ref 3.5–5.2)
Total Protein: 6.8 g/dL (ref 6.0–8.3)

## 2012-07-29 LAB — LIPID PANEL
HDL: 44 mg/dL (ref >39)
LDL Cholesterol: 87 mg/dL (ref 0–99)
Total CHOL/HDL Ratio: 3.3 Ratio

## 2012-07-29 LAB — BASIC METABOLIC PANEL
CO2: 24 mEq/L (ref 19–32)
Chloride: 101 mEq/L (ref 96–112)
Creat: 0.94 mg/dL (ref 0.50–1.35)
Sodium: 139 mEq/L (ref 135–145)

## 2012-07-30 ENCOUNTER — Ambulatory Visit (INDEPENDENT_AMBULATORY_CARE_PROVIDER_SITE_OTHER): Payer: 59 | Admitting: Internal Medicine

## 2012-07-30 ENCOUNTER — Encounter: Payer: Self-pay | Admitting: Internal Medicine

## 2012-07-30 ENCOUNTER — Telehealth: Payer: Self-pay | Admitting: Internal Medicine

## 2012-07-30 VITALS — BP 108/72 | HR 65 | Temp 98.0°F | Resp 16 | Ht 68.0 in | Wt 197.8 lb

## 2012-07-30 DIAGNOSIS — Z79899 Other long term (current) drug therapy: Secondary | ICD-10-CM

## 2012-07-30 DIAGNOSIS — I10 Essential (primary) hypertension: Secondary | ICD-10-CM

## 2012-07-30 DIAGNOSIS — N62 Hypertrophy of breast: Secondary | ICD-10-CM | POA: Insufficient documentation

## 2012-07-30 DIAGNOSIS — E039 Hypothyroidism, unspecified: Secondary | ICD-10-CM

## 2012-07-30 DIAGNOSIS — Z125 Encounter for screening for malignant neoplasm of prostate: Secondary | ICD-10-CM

## 2012-07-30 DIAGNOSIS — E785 Hyperlipidemia, unspecified: Secondary | ICD-10-CM

## 2012-07-30 NOTE — Assessment & Plan Note (Signed)
Good control. Continue current dosing.

## 2012-07-30 NOTE — Assessment & Plan Note (Signed)
Stable and at goal. Continue statin therapy.

## 2012-07-30 NOTE — Progress Notes (Signed)
  Subjective:    Patient ID: Zachary [name], male    DOB: 02/11/1951, 61 y.o.   MRN: [id number]  HPI Pt presents to clinic for followup of multiple medical problems. BP reviewed as normotensive. Tolerating statin tx with nl lft's. LDL reviewed at goal. Walking/exercising regularly. Wt down 6lbs. No active complaint.  Past Medical History  Diagnosis Date  . Hyperlipidemia   . Hypertension   . Insomnia   . Hypothyroidism   . GERD (gastroesophageal reflux disease)   . Arthritis   . Hemorrhoids   . Hearing problem     hearing deficit  . Diverticulitis    Past Surgical History  Procedure Date  . Hemorrhoid surgery 2007  . Back surgery 1978    herniated disksurgery  . Tendon repair June 06, 2011    right elbow, Dr. Eulah Pont    reports that he has never smoked. He has never used smokeless tobacco. He reports that he drinks alcohol. He reports that he does not use illicit drugs. family history includes Alcohol abuse in his other; Arthritis in his other; Breast cancer in his mother; Cancer in his brother and other; Coronary artery disease in his other; Cystic fibrosis in his other; Heart disease in his father; and Irritable bowel syndrome in his other. Allergies  Allergen Reactions  . Simvastatin     REACTION: muscle pain      Review of Systems see hpi     Objective:   Physical Exam  Physical Exam  Nursing note and vitals reviewed. Constitutional: Appears well-developed and well-nourished. No distress.  HENT:  Head: Normocephalic and atraumatic.  Right Ear: External ear normal.  Left Ear: External ear normal.  Eyes: Conjunctivae are normal. No scleral icterus.  Neck: Neck supple. Carotid bruit is not present.  Cardiovascular: Normal rate, regular rhythm and normal heart sounds.  Exam reveals no gallop and no friction rub.   No murmur heard. Pulmonary/Chest: Effort normal and breath sounds normal. No respiratory distress. He has no wheezes. no rales.  Lymphadenopathy:   He has no cervical adenopathy.  Neurological:Alert.  Skin: Skin is warm and dry. Not diaphoretic.  Psychiatric: Has a normal mood and affect.        Assessment & Plan:

## 2012-07-30 NOTE — Assessment & Plan Note (Signed)
Normotensive and stable. Continue current regimen. Monitor bp as outpt and followup in clinic as scheduled.  

## 2012-07-30 NOTE — Patient Instructions (Signed)
Please schedule fasting labs prior to next visit Lipid/lft-272.4, cbc-401.9, chem7-v58.69 and psa-prostate cancer screening

## 2012-07-30 NOTE — Telephone Encounter (Signed)
Please schedule fasting labs prior to next visit  Lipid/lft-272.4, cbc-401.9, chem7-v58.69 and psa-prostate cancer screening   Patient has upcoming appointment in Feb/2014. He will be going to the [facility] lab

## 2012-08-06 ENCOUNTER — Ambulatory Visit: Payer: 59 | Admitting: Internal Medicine

## 2012-08-11 ENCOUNTER — Encounter: Payer: Self-pay | Admitting: Pulmonary Disease

## 2012-08-11 ENCOUNTER — Ambulatory Visit (INDEPENDENT_AMBULATORY_CARE_PROVIDER_SITE_OTHER): Payer: 59 | Admitting: Pulmonary Disease

## 2012-08-11 VITALS — BP 122/78 | HR 63 | Temp 98.0°F | Ht 68.0 in | Wt 198.0 lb

## 2012-08-11 DIAGNOSIS — G4733 Obstructive sleep apnea (adult) (pediatric): Secondary | ICD-10-CM

## 2012-08-11 NOTE — Progress Notes (Signed)
  Subjective:    Patient ID: Zachary [name], male    DOB: 08/17/1951, 61 y.o.   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 Some bruxism was noted   9/12 > changed to 11 cm based on download  08/11/2012 Annual FU  He feels well rested, mask Ok, pressure OK, has adjusted humidity. No somnolence or snoring There is no history suggestive of cataplexy, sleep paralysis or parasomnias  No sleep latency Gained 5 lbs      Review of Systems neg for any significant sore throat, dysphagia, itching, sneezing, nasal congestion or excess/ purulent secretions, fever, chills, sweats, unintended wt loss, pleuritic or exertional cp, hempoptysis, orthopnea pnd or change in chronic leg swelling. Also denies presyncope, palpitations, heartburn, abdominal pain, nausea, vomiting, diarrhea or change in bowel or urinary habits, dysuria,hematuria, rash, arthralgias, visual complaints, headache, numbness weakness or ataxia.     Objective:   Physical Exam  Gen. Pleasant, well-nourished, in no distress ENT - no lesions, no post nasal drip Neck: No JVD, no thyromegaly, no carotid bruits Lungs: no use of accessory muscles, no dullness to percussion, clear without rales or rhonchi  Cardiovascular: Rhythm regular, heart sounds  normal, no murmurs or gallops, no peripheral edema Musculoskeletal: No deformities, no cyanosis or clubbing        Assessment & Plan:

## 2012-08-11 NOTE — Assessment & Plan Note (Signed)
Weight loss encouraged, compliance with goal of at least 4-6 hrs every night is the expectation. Advised against medications with sedative side effects Cautioned against driving when sleepy - understanding that sleepiness will vary on a day to day basis  

## 2012-08-27 NOTE — Telephone Encounter (Signed)
Future lab orders entered and given to the lab.

## 2012-10-07 ENCOUNTER — Other Ambulatory Visit: Payer: Self-pay | Admitting: Internal Medicine

## 2012-11-12 ENCOUNTER — Ambulatory Visit (INDEPENDENT_AMBULATORY_CARE_PROVIDER_SITE_OTHER): Payer: 59 | Admitting: Family Medicine

## 2012-11-12 ENCOUNTER — Encounter: Payer: Self-pay | Admitting: Family Medicine

## 2012-11-12 VITALS — BP 126/80 | HR 65 | Ht 68.0 in | Wt 195.0 lb

## 2012-11-12 DIAGNOSIS — M25529 Pain in unspecified elbow: Secondary | ICD-10-CM

## 2012-11-12 DIAGNOSIS — M25522 Pain in left elbow: Secondary | ICD-10-CM

## 2012-11-12 NOTE — Patient Instructions (Addendum)
You have strained your common extensor tendon. This is treated the same way as tennis elbow is. Try to avoid painful activities as much as possible. Ice the area 3-4 times a day for 15 minutes at a time. Ibuprofen 3 tabs three times a day with food. Tylenol in addition to this if needed. Counterforce brace as directed can help unload area - wear this regularly if it provides you with relief. Home Pronation/supination with 1 pound weight, wrist extension with weight, stretching - do these once a day. Consider formal physical therapy If after 5-6 weeks you're not improving as expected, the next step is an MRI but this is typically not necessary.

## 2012-11-23 ENCOUNTER — Encounter: Payer: Self-pay | Admitting: Family Medicine

## 2012-11-23 NOTE — Assessment & Plan Note (Signed)
2/2 common extensor tendon strain with overuse.  Start home exercise program, icing, tylenol, ibuprofen, relative rest.  Consider PT, injection, nitro patches, MRI if not improving as expected.

## 2012-11-23 NOTE — Progress Notes (Signed)
Subjective:    Patient ID: Zachary [name], male    DOB: 25-Jun-1951, 61 y.o.   MRN: [id number]  PCP: Dr. Rodena Medin  HPI 61 yo M here for left elbow pain.  Patient previously seen for right elbow lateral epicondylitis - eventually went on to have debridement after failing conservative treatment. States he was woodworking on Saturday making signs for his grandchildren. Involved a lot of turning the wood. Felt soreness left lateral elbow after this. No swelling or bruising. Burning pain when lifting things with left hand and pulling. Taking ibuprofen. Not done home exercises or other treatment for this.  Past Medical History  Diagnosis Date  . Hyperlipidemia   . Hypertension   . Insomnia   . Hypothyroidism   . GERD (gastroesophageal reflux disease)   . Arthritis   . Hemorrhoids   . Hearing problem     hearing deficit  . Diverticulitis     Current Outpatient Prescriptions on File Prior to Visit  Medication Sig Dispense Refill  . aspirin EC 81 MG EC tablet Take 81 mg by mouth daily.        . finasteride (PROSCAR) 5 MG tablet Take 1 tablet (5 mg total) by mouth daily.  90 tablet  3  . levothyroxine (SYNTHROID, LEVOTHROID) 100 MCG tablet Take 1 tablet (100 mcg total) by mouth daily.  90 tablet  3  . lisinopril (PRINIVIL,ZESTRIL) 5 MG tablet Take 1 tablet (5 mg total) by mouth daily.  90 tablet  3  . loratadine (CLARITIN) 10 MG tablet Take 10 mg by mouth daily.        . Multiple Vitamin (MULTIVITAMINS PO) Take 1 tablet by mouth daily.        . Omega-3 Fatty Acids (FISH OIL) 1200 MG CAPS Take 2 capsules by mouth daily.        Marland Kitchen omeprazole-sodium bicarbonate (ZEGERID) 40-1100 MG per capsule Take 1 capsule by mouth daily before breakfast.  90 capsule  3  . pravastatin (PRAVACHOL) 40 MG tablet TAKE 1/2 TABLET BY MOUTH DAILY.  45 tablet  1    Past Surgical History  Procedure Date  . Hemorrhoid surgery 2007  . Back surgery 1978    herniated disksurgery  . Tendon repair June 06, 2011    right elbow, Dr. Eulah Pont    Allergies  Allergen Reactions  . Simvastatin     REACTION: muscle pain    History   Social History  . Marital Status: Married    Spouse Name: [name]    Number of Children: 2  . 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 Smoking status: Never Smoker   . Smokeless tobacco: Never Used  . Alcohol Use: Yes  . Drug Use: No  . Sexually Active: Not on file   Other Topics Concern  . Not on file   Social History Narrative   Previously Loss adjuster, [employer]    Family History  Problem Relation Age of Onset  . Heart disease Father   . Breast cancer Mother   . Cancer Brother   . Alcohol abuse Other   . Arthritis Other   . Cancer Other     Breast, Prostate  . Coronary artery disease Other   . Irritable bowel syndrome Other   . Cystic fibrosis Other     BP 126/80  Pulse 65  Ht 5\' 8"  (1.727  m)  Wt 195 lb (88.451 kg)  BMI 29.65 kg/m2  Review of Systems See HPI above.    Objective:   Physical Exam Gen: NAD  L elbow: No gross deformity, swelling, bruising. TTP lateral epicondyle, common extensor mass.  No other TTP about elbow. FROM. Pain with resisted wrist and 3rd digit extension. Collateral ligaments intact. NVI distally.    Assessment & Plan:  1. Left elbow pain - 2/2 common extensor tendon strain with overuse.  Start home exercise program, icing, tylenol, ibuprofen, relative rest.  Consider PT, injection, nitro patches, MRI if not improving as expected.

## 2012-11-29 ENCOUNTER — Emergency Department
Admission: EM | Admit: 2012-11-29 | Discharge: 2012-11-29 | Disposition: A | Discharge status: Home / Self Care | Payer: 59 | Source: Home / Self Care | Attending: Emergency Medicine | Admitting: Emergency Medicine

## 2012-11-29 ENCOUNTER — Encounter: Payer: Self-pay | Admitting: Emergency Medicine

## 2012-11-29 DIAGNOSIS — K5792 Diverticulitis of intestine, part unspecified, without perforation or abscess without bleeding: Secondary | ICD-10-CM

## 2012-11-29 DIAGNOSIS — K5732 Diverticulitis of large intestine without perforation or abscess without bleeding: Secondary | ICD-10-CM

## 2012-11-29 MED ORDER — CIPROFLOXACIN HCL 500 MG PO TABS: 500.0000 mg | ORAL_TABLET | Freq: Two times a day (BID) | ORAL | Status: DC

## 2012-11-29 MED ORDER — METRONIDAZOLE 500 MG PO TABS: 500.0000 mg | ORAL_TABLET | Freq: Two times a day (BID) | ORAL | Status: DC

## 2012-11-29 NOTE — ED Notes (Signed)
Patient has hx of diverticulosis; inadvertently ingested peanuts 2 days ago and has abdominal cramping since then. Taking Maalox without resolution.

## 2012-11-29 NOTE — ED Provider Notes (Signed)
History     CSN: [id number]  Arrival date & time 11/29/12  1214   First MD Initiated Contact with Patient 11/29/12 1248      Chief Complaint  Patient presents with  . Abdominal Cramping    Patient is a 61 y.o. male presenting with cramps. The history is provided by the patient.  Abdominal Cramping The primary symptoms of the illness include abdominal pain (LLQ, moderate intensity). The primary symptoms of the illness do not include fever, fatigue, shortness of breath, nausea, vomiting, diarrhea, hematemesis, hematochezia or dysuria. The current episode started 2 days ago. The onset of the illness was sudden (Right after eating some peanuts, which were associated with his last bout of similar diverticulitis 2 years ago, which resolved at that time with antibiotics without sequelae). The problem has not changed since onset. Associated with: No recent travel or antibiotic use. Symptoms associated with the illness do not include chills, anorexia, diaphoresis, heartburn, constipation, urgency, hematuria, frequency or back pain. Significant associated medical issues do not include PUD, GERD or inflammatory bowel disease.   He denies any radiation of the left lower cautery pain. No rectal pain or rectal symptoms. No change of bowel habits. Past Medical History  Diagnosis Date  . Hyperlipidemia   . Hypertension   . Insomnia   . Hypothyroidism   . GERD (gastroesophageal reflux disease)   . Arthritis   . Hemorrhoids   . Hearing problem     hearing deficit  . Diverticulitis     Past Surgical History  Procedure Date  . Hemorrhoid surgery 2007  . Back surgery 1978    herniated disksurgery  . Tendon repair June 06, 2011    right elbow, Dr. Eulah Pont    Family History  Problem Relation Age of Onset  . Heart disease Father   . Breast cancer Mother   . Cancer Brother   . Alcohol abuse Other   . Arthritis Other   . Cancer Other     Breast, Prostate  . Coronary artery disease Other   .  Irritable bowel syndrome Other   . Cystic fibrosis Other     History  Substance Use Topics  . Smoking status: Never Smoker   . Smokeless tobacco: Never Used  . Alcohol Use: Yes      Review of Systems  Constitutional: Negative for fever, chills, diaphoresis and fatigue.  Respiratory: Negative for shortness of breath.   Gastrointestinal: Positive for abdominal pain (LLQ, moderate intensity). Negative for heartburn, nausea, vomiting, diarrhea, constipation, hematochezia, anorexia and hematemesis.  Genitourinary: Negative for dysuria, urgency, frequency and hematuria.  Musculoskeletal: Negative for back pain.  All other systems reviewed and are negative.    Allergies  Simvastatin  Home Medications   Current Outpatient Rx  Name  Route  Sig  Dispense  Refill  . ASPIRIN EC 81 MG PO TBEC   Oral   Take 81 mg by mouth daily.           Marland Kitchen CIPROFLOXACIN HCL 500 MG PO TABS   Oral   Take 1 tablet (500 mg total) by mouth 2 (two) times daily. Take for 10 days.   20 tablet   0   . FINASTERIDE 5 MG PO TABS   Oral   Take 1 tablet (5 mg total) by mouth daily.   90 tablet   3   . LEVOTHYROXINE SODIUM 100 MCG PO TABS   Oral   Take 1 tablet (100 mcg total) by mouth daily.  90 tablet   3   . LISINOPRIL 5 MG PO TABS   Oral   Take 1 tablet (5 mg total) by mouth daily.   90 tablet   3   . LORATADINE 10 MG PO TABS   Oral   Take 10 mg by mouth daily.           Marland Kitchen METRONIDAZOLE 500 MG PO TABS   Oral   Take 1 tablet (500 mg total) by mouth 2 (two) times daily.   20 tablet   0   . MULTIVITAMINS PO   Oral   Take 1 tablet by mouth daily.           Marland Kitchen FISH OIL 1200 MG PO CAPS   Oral   Take 2 capsules by mouth daily.           Marland Kitchen OMEPRAZOLE-SODIUM BICARBONATE 40-1100 MG PO CAPS   Oral   Take 1 capsule by mouth daily before breakfast.   90 capsule   3   . PRAVASTATIN SODIUM 40 MG PO TABS      TAKE 1/2 TABLET BY MOUTH DAILY.   45 tablet   1     Dispense as  written.     BP 126/70  Pulse 71  Temp 98 F (36.7 C) (Oral)  Resp 16  Ht 5\' 8"  (1.727 m)  Wt 195 lb (88.451 kg)  BMI 29.65 kg/m2  SpO2 95%  Physical Exam  Nursing note and vitals reviewed. Constitutional: He is oriented to person, place, and time. He appears well-developed and well-nourished. No distress.  HENT:  Head: Normocephalic and atraumatic.  Mouth/Throat: Oropharynx is clear and moist.  Eyes: Conjunctivae normal and EOM are normal. Pupils are equal, round, and reactive to light. No scleral icterus.  Neck: Normal range of motion. Neck supple. No JVD present.  Cardiovascular: Normal rate and regular rhythm.   No murmur heard. Pulmonary/Chest: Effort normal and breath sounds normal.  Abdominal: Soft. Bowel sounds are normal. He exhibits no distension and no mass. There is tenderness (mild tenderness left lower quadrant). There is no rebound and no guarding.  Genitourinary:       Patient declined GU and rectal exam  Musculoskeletal: Normal range of motion.  Neurological: He is alert and oriented to person, place, and time.  Skin: Skin is warm.  Psychiatric: He has a normal mood and affect.    ED Course  Procedures (including critical care time)  Labs Reviewed - No data to display No results found.   1. Diverticulitis       MDM  Clinically, patient has mild diverticulitis. No clinical evidence of acute or surgical abdomen.  I advised and offered to do some basic blood work such as CBC and urinalysis, but he politely declined. He requests antibiotics as treatment for diverticulitis. Prescribed Cipro and metronidazole. Twice a day x10 days. Other symptomatic care discussed. Red flags discussed. Followup with PCP in one to 2 weeks, sooner if worse or new symptoms. .seeSee detailed Instructions in AVS, which were given to patient. Verbal instructions also given. Risks, benefits, and alternatives of treatment options discussed. Questions invited and answered.  Patient voiced understanding and agreement with plans.         Lajean Manes, MD 11/29/12 [phone]

## 2012-12-08 ENCOUNTER — Emergency Department
Admission: EM | Admit: 2012-12-08 | Discharge: 2012-12-08 | Disposition: A | Discharge status: Home / Self Care | Payer: 59 | Source: Home / Self Care | Attending: Family Medicine | Admitting: Family Medicine

## 2012-12-08 ENCOUNTER — Encounter: Payer: Self-pay | Admitting: *Deleted

## 2012-12-08 DIAGNOSIS — B3749 Other urogenital candidiasis: Secondary | ICD-10-CM

## 2012-12-08 DIAGNOSIS — B3742 Candidal balanitis: Secondary | ICD-10-CM

## 2012-12-08 MED ORDER — KETOCONAZOLE 2 % EX CREA: TOPICAL_CREAM | Freq: Two times a day (BID) | CUTANEOUS | Status: DC

## 2012-12-08 NOTE — ED Notes (Signed)
Pt c/o rash in groin area x this morning. Denies fever. No OTC meds.

## 2012-12-08 NOTE — ED Provider Notes (Signed)
History     CSN: [id number]  Arrival date & time 12/08/12  1424   First MD Initiated Contact with Patient 12/08/12 1558      Chief Complaint  Patient presents with  . Rash      HPI Comments:   Chief Complaint     Rash        ED Vitals from 12/08/12 0000 to 12/08/12 15:20:13       Date and Time   Temp   Pulse   Resp   BP   SpO2   Weight Who    12/08/12 1519   98.4 (36.9)   93   18   135/88   98   195 lb (88.451 kg) CBL      ED Notes, ED Provider Notes from 12/08/12 0000 to 12/08/12 15:20:13         Christy B Locklear, LPN 14/78/2956 21:30     Pt c/o rash in groin area x this morning. Denies fever. No OTC meds.                ED Current OP Medications       Medication Sig Dispense Doc. Provider    aspirin EC 81 MG EC tablet Take 81 mg by mouth daily.     Historical Provider, MD    ciprofloxacin (CIPRO) 500 MG tablet Take 1 tablet (500 mg total) by mouth 2 (two) times daily. Take for 10 days. 20 tablet Lajean Manes, MD    finasteride (PROSCAR) 5 MG tablet Take 1 tablet (5 mg total) by mouth daily. 90 tablet Edwyna Perfect, MD    ketoconazole (NIZORAL) 2 % cream Apply topically 2 (two) times daily. 30 g Lattie Haw, MD    levothyroxine (SYNTHROID, LEVOTHROID) 100 MCG tablet Take 1 tablet (100 mcg total) by mouth daily. 90 tablet Edwyna Perfect, MD    lisinopril (PRINIVIL,ZESTRIL) 5 MG tablet Take 1 tablet (5 mg total) by mouth daily. 90 tablet Edwyna Perfect, MD    loratadine (CLARITIN) 10 MG tablet Take 10 mg by mouth daily.     Historical Provider, MD    metroNIDAZOLE (FLAGYL) 500 MG tablet Take 1 tablet (500 mg total) by mouth 2 (two) times daily. 20 tablet Lajean Manes, MD    Multiple Vitamin (MULTIVITAMINS PO) Take 1 tablet by mouth daily.     Historical Provider, MD    Omega-3 Fatty Acids (FISH OIL) 1200 MG CAPS Take 2 capsules by mouth daily.     Historical Provider, MD    omeprazole-sodium bicarbonate (ZEGERID) 40-1100 MG per capsule Take 1 capsule by  mouth daily before breakfast. 90 capsule Edwyna Perfect, MD    pravastatin (PRAVACHOL) 40 MG tablet TAKE 1/2 TABLET BY MOUTH DAILY. 45 tablet Edwyna Perfect, MD      Allergies (verified on: 12/10/12)       Agent Severity Comments    SIMVASTATIN   REACTION: muscle pain      Pt c/o rash in groin area x this morning. Denies fever. No OTC meds.  No urethral discharge.  He recently started taking antibiotics Cipro and Flagyl      Patient is a 61 y.o. male presenting with rash. The history is provided by the patient.  Rash  This is a new problem. The current episode started 6 to 12 hours ago. The problem has not changed since onset.The problem is associated with new medications. There has been no fever. Affected Location: penis. The patient  is experiencing no pain. Pertinent negatives include no blisters, no itching, no pain and no weeping. He has tried nothing for the symptoms.    Past Medical History  Diagnosis Date  . Hyperlipidemia   . Hypertension   . Insomnia   . Hypothyroidism   . GERD (gastroesophageal reflux disease)   . Arthritis   . Hemorrhoids   . Hearing problem     hearing deficit  . Diverticulitis     Past Surgical History  Procedure Date  . Hemorrhoid surgery 2007  . Back surgery 1978    herniated disksurgery  . Tendon repair June 06, 2011    right elbow, Dr. Eulah Pont    Family History  Problem Relation Age of Onset  . Heart disease Father   . Breast cancer Mother   . Cancer Brother   . Alcohol abuse Other   . Arthritis Other   . Cancer Other     Breast, Prostate  . Coronary artery disease Other   . Irritable bowel syndrome Other   . Cystic fibrosis Other     History  Substance Use Topics  . Smoking status: Never Smoker   . Smokeless tobacco: Never Used  . Alcohol Use: Yes      Review of Systems  Skin: Positive for rash. Negative for itching.  All other systems reviewed and are negative.    Allergies  Simvastatin  Home Medications    Current Outpatient Rx  Name  Route  Sig  Dispense  Refill  . ASPIRIN EC 81 MG PO TBEC   Oral   Take 81 mg by mouth daily.           Marland Kitchen CIPROFLOXACIN HCL 500 MG PO TABS   Oral   Take 1 tablet (500 mg total) by mouth 2 (two) times daily. Take for 10 days.   20 tablet   0   . FINASTERIDE 5 MG PO TABS   Oral   Take 1 tablet (5 mg total) by mouth daily.   90 tablet   3   . KETOCONAZOLE 2 % EX CREA   Topical   Apply topically 2 (two) times daily.   30 g   0   . LEVOTHYROXINE SODIUM 100 MCG PO TABS   Oral   Take 1 tablet (100 mcg total) by mouth daily.   90 tablet   3   . LISINOPRIL 5 MG PO TABS   Oral   Take 1 tablet (5 mg total) by mouth daily.   90 tablet   3   . LORATADINE 10 MG PO TABS   Oral   Take 10 mg by mouth daily.           Marland Kitchen METRONIDAZOLE 500 MG PO TABS   Oral   Take 1 tablet (500 mg total) by mouth 2 (two) times daily.   20 tablet   0   . MULTIVITAMINS PO   Oral   Take 1 tablet by mouth daily.           Marland Kitchen FISH OIL 1200 MG PO CAPS   Oral   Take 2 capsules by mouth daily.           Marland Kitchen OMEPRAZOLE-SODIUM BICARBONATE 40-1100 MG PO CAPS   Oral   Take 1 capsule by mouth daily before breakfast.   90 capsule   3   . PRAVASTATIN SODIUM 40 MG PO TABS      TAKE 1/2 TABLET BY MOUTH DAILY.   45  tablet   1     Dispense as written.     BP 135/88  Pulse 93  Temp 98.4 F (36.9 C) (Oral)  Resp 18  Ht 5\' 8"  (1.727 m)  Wt 195 lb (88.451 kg)  BMI 29.65 kg/m2  SpO2 98%  Physical Exam Nursing notes and Vital Signs reviewed. Appearance:  Patient appears healthy, stated age, and in no acute distress Eyes:  Pupils are equal, round, and reactive to light and accomodation.  Extraocular movement is intact.  Conjunctivae are not inflamed  Skin:  (GU) In the intertriginous area beneath prepuce is erythema and small amount of whitish plaque.  No swelling or tenderness.  ED Course  Procedures  none      1. Candidiasis of penis       MDM   Begin Nizoral cream. Followup with Family Doctor if not improved in one week.         Lattie Haw, MD 12/14/12 [phone]

## 2012-12-10 ENCOUNTER — Encounter: Payer: Self-pay | Admitting: Internal Medicine

## 2012-12-10 ENCOUNTER — Ambulatory Visit (INDEPENDENT_AMBULATORY_CARE_PROVIDER_SITE_OTHER): Payer: 59 | Admitting: Internal Medicine

## 2012-12-10 VITALS — BP 114/72 | HR 72 | Temp 98.0°F | Resp 16 | Wt 196.5 lb

## 2012-12-10 DIAGNOSIS — R413 Other amnesia: Secondary | ICD-10-CM

## 2012-12-10 DIAGNOSIS — Z125 Encounter for screening for malignant neoplasm of prostate: Secondary | ICD-10-CM

## 2012-12-10 DIAGNOSIS — M791 Myalgia, unspecified site: Secondary | ICD-10-CM

## 2012-12-10 DIAGNOSIS — E785 Hyperlipidemia, unspecified: Secondary | ICD-10-CM

## 2012-12-10 DIAGNOSIS — Z79899 Other long term (current) drug therapy: Secondary | ICD-10-CM

## 2012-12-10 DIAGNOSIS — I10 Essential (primary) hypertension: Secondary | ICD-10-CM

## 2012-12-10 DIAGNOSIS — N63 Unspecified lump in unspecified breast: Secondary | ICD-10-CM

## 2012-12-10 DIAGNOSIS — IMO0001 Reserved for inherently not codable concepts without codable children: Secondary | ICD-10-CM

## 2012-12-10 LAB — RPR

## 2012-12-10 LAB — VITAMIN B12: Vitamin B-12: 624 pg/mL (ref 211–911)

## 2012-12-18 DIAGNOSIS — R413 Other amnesia: Secondary | ICD-10-CM | POA: Insufficient documentation

## 2012-12-18 DIAGNOSIS — M791 Myalgia, unspecified site: Secondary | ICD-10-CM | POA: Insufficient documentation

## 2012-12-18 NOTE — Assessment & Plan Note (Signed)
Normotensive and stable. Continue current regimen. Monitor bp as outpt and followup in clinic as scheduled.  

## 2012-12-18 NOTE — Assessment & Plan Note (Signed)
Obtain tsh, b12 and rpr. Discussed possible cranial mri and/or neurology consult if sx's persist

## 2012-12-18 NOTE — Progress Notes (Signed)
  Subjective:    Patient ID: Zachary [name], male    DOB: [birth year], 61 y.o.   MRN: [id number]  HPI Pt presents to clinic for followup of multiple medical problems. States has had recent groin rash now improving with nizoral. His wife tells him his memory has decreased ie forgetting names. No neurologic deficits. Also pt notes frequent muscle problems such as tears that he feels are out of the ordinary. Wonders about medication effects.   Past Medical History  Diagnosis Date  . Hyperlipidemia   . Hypertension   . Insomnia   . Hypothyroidism   . GERD (gastroesophageal reflux disease)   . Arthritis   . Hemorrhoids   . Hearing problem     hearing deficit  . Diverticulitis    Past Surgical History  Procedure Date  . Hemorrhoid surgery 2007  . Back surgery 1978    herniated disksurgery  . Tendon repair June 06, 2011    right elbow, Dr. Eulah Pont    reports that he has never smoked. He has never used smokeless tobacco. He reports that he drinks alcohol. He reports that he does not use illicit drugs. family history includes Alcohol abuse in his other; Arthritis in his other; Breast cancer in his mother; Cancer in his brother and other; Coronary artery disease in his other; Cystic fibrosis in his other; Heart disease in his father; and Irritable bowel syndrome in his other. Allergies  Allergen Reactions  . Simvastatin     REACTION: muscle pain      Review of Systems see hpi     Objective:   Physical Exam  Physical Exam  Nursing note and vitals reviewed. Constitutional: Appears well-developed and well-nourished. No distress.  HENT:  Head: Normocephalic and atraumatic.  Right Ear: External ear normal.  Left Ear: External ear normal.  Eyes: Conjunctivae are normal. No scleral icterus.  Neck: Neck supple. Carotid bruit is not present.  Cardiovascular: Normal rate, regular rhythm and normal heart sounds.  Exam reveals no gallop and no friction rub.   No murmur  heard. Pulmonary/Chest: Effort normal and breath sounds normal. No respiratory distress. He has no wheezes. no rales.  Lymphadenopathy:    He has no cervical adenopathy.  Neurological:Alert.  Skin: Skin is warm and dry. Not diaphoretic.  Psychiatric: Has a normal mood and affect.        Assessment & Plan:

## 2012-12-18 NOTE — Assessment & Plan Note (Signed)
Given statin use obtain ck enzyme

## 2013-01-07 ENCOUNTER — Ambulatory Visit (INDEPENDENT_AMBULATORY_CARE_PROVIDER_SITE_OTHER): Payer: 59 | Admitting: *Deleted

## 2013-01-07 DIAGNOSIS — Z23 Encounter for immunization: Secondary | ICD-10-CM

## 2013-01-21 LAB — CBC WITH DIFFERENTIAL/PLATELET
Basophils Absolute: 0 10*3/uL (ref 0.0–0.1)
Eosinophils Absolute: 0.4 10*3/uL (ref 0.0–0.7)
Lymphocytes Relative: 37 % (ref 12–46)
Lymphs Abs: 2.2 10*3/uL (ref 0.7–4.0)
Neutrophils Relative %: 51 % (ref 43–77)
Platelets: 236 10*3/uL (ref 150–400)
RBC: 4.79 MIL/uL (ref 4.22–5.81)
WBC: 6.1 10*3/uL (ref 4.0–10.5)

## 2013-01-21 LAB — LIPID PANEL
HDL: 43 mg/dL (ref >39)
Total CHOL/HDL Ratio: 3.5 Ratio
Triglycerides: 76 mg/dL (ref <150)

## 2013-01-21 LAB — BASIC METABOLIC PANEL
BUN: 20 mg/dL (ref 6–23)
Calcium: 9.8 mg/dL (ref 8.4–10.5)
Creat: 1.06 mg/dL (ref 0.50–1.35)

## 2013-01-21 LAB — HEPATIC FUNCTION PANEL
Albumin: 4.5 g/dL (ref 3.5–5.2)
Alkaline Phosphatase: 54 U/L (ref 39–117)
Indirect Bilirubin: 0.3 mg/dL (ref 0.0–0.9)
Total Bilirubin: 0.4 mg/dL (ref 0.3–1.2)

## 2013-01-21 LAB — PSA: PSA: 4.92 ng/mL — ABNORMAL HIGH (ref <=4.00)

## 2013-01-21 NOTE — Addendum Note (Signed)
Addended by: Court Joy on: 01/21/2013 01:58 PM   Modules accepted: Orders

## 2013-01-29 ENCOUNTER — Encounter: Payer: Self-pay | Admitting: Family

## 2013-01-29 ENCOUNTER — Ambulatory Visit (INDEPENDENT_AMBULATORY_CARE_PROVIDER_SITE_OTHER): Payer: 59 | Admitting: Family

## 2013-01-29 VITALS — BP 120/84 | HR 76 | Temp 98.4°F | Resp 16 | Ht 68.0 in | Wt 199.0 lb

## 2013-01-29 DIAGNOSIS — I10 Essential (primary) hypertension: Secondary | ICD-10-CM

## 2013-01-29 DIAGNOSIS — R972 Elevated prostate specific antigen [PSA]: Secondary | ICD-10-CM

## 2013-01-29 DIAGNOSIS — E785 Hyperlipidemia, unspecified: Secondary | ICD-10-CM

## 2013-01-29 MED ORDER — CIPROFLOXACIN HCL 500 MG PO TABS: 500.0000 mg | ORAL_TABLET | Freq: Two times a day (BID) | ORAL | Status: DC

## 2013-01-29 NOTE — Assessment & Plan Note (Signed)
BP stable on low dose ACE, continue same.

## 2013-01-29 NOTE — Assessment & Plan Note (Signed)
Lipids at goal, lft normal, tolerating statin, continue same.

## 2013-01-29 NOTE — Assessment & Plan Note (Signed)
Initiate empiric cipro, refer to urology for further evaluation.

## 2013-01-29 NOTE — Progress Notes (Signed)
Subjective:    Patient ID: Zachary [name], male    DOB: 1951/10/05, 62 y.o.   MRN: [id number]  HPI  [name] is a 62 yr old male who presents today for follow up.  1) HTN- Pt is maintained on lisinopril. Denies CP or SOB.    2) Hypothyroid- he is currently on levothyroxine .   3)  Hyperlipidemia-  ldl 93, LFT normal.   4) Elevated PSA- Reports urinary frequency.  He is maintained on proscar.      Review of Systems See HPI  Past Medical History  Diagnosis Date  . Hyperlipidemia   . Hypertension   . Insomnia   . Hypothyroidism   . GERD (gastroesophageal reflux disease)   . Arthritis   . Hemorrhoids   . Hearing problem     hearing deficit  . Diverticulitis     History   Social History  . Marital Status: Married    Spouse Name: Zachary [name]    Number of Children: 2  . 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 Smoking status: Never Smoker   . Smokeless tobacco: Never Used  . Alcohol Use: Yes  . Drug Use: No  . Sexually Active: Not on file   Other Topics Concern  . Not on file   Social History Narrative   Previously Loss adjuster, [employer]    Past Surgical History  Procedure Date  . Hemorrhoid surgery 2007  . Back surgery 1978    herniated disksurgery  . Tendon repair June 06, 2011    right elbow, Dr. Eulah Pont    Family History  Problem Relation Age of Onset  . Heart disease Father   . Breast cancer Mother   . Cancer Brother   . Alcohol abuse Other   . Arthritis Other   . Cancer Other     Breast, Prostate  . Coronary artery disease Other   . Irritable bowel syndrome Other   . Cystic fibrosis Other     Allergies  Allergen Reactions  . Simvastatin     REACTION: muscle pain    Current Outpatient Prescriptions on File Prior to Visit  Medication Sig Dispense Refill  . aspirin EC 81 MG EC tablet Take 81 mg by mouth daily.         . finasteride (PROSCAR) 5 MG tablet Take 1 tablet (5 mg total) by mouth daily.  90 tablet  3  . levothyroxine (SYNTHROID, LEVOTHROID) 100 MCG tablet Take 1 tablet (100 mcg total) by mouth daily.  90 tablet  3  . lisinopril (PRINIVIL,ZESTRIL) 5 MG tablet Take 1 tablet (5 mg total) by mouth daily.  90 tablet  3  . loratadine (CLARITIN) 10 MG tablet Take 10 mg by mouth daily.        . Multiple Vitamin (MULTIVITAMINS PO) Take 1 tablet by mouth daily.        . Omega-3 Fatty Acids (FISH OIL) 1200 MG CAPS Take 2 capsules by mouth daily.        Marland Kitchen omeprazole-sodium bicarbonate (ZEGERID) 40-1100 MG per capsule Take 1 capsule by mouth daily before breakfast.  90 capsule  3  . pravastatin (PRAVACHOL) 40 MG tablet TAKE 1/2 TABLET BY MOUTH DAILY.  45 tablet  1    BP 120/84  Pulse 76  Temp 98.4 F (36.9 C) (Oral)  Resp 16  Ht 5\' 8"  (1.727  m)  Wt 199 lb (90.266 kg)  BMI 30.26 kg/m2  SpO2 99%       Objective:   Physical Exam  Constitutional: He appears well-developed and well-nourished. No distress.  Cardiovascular: Normal rate and regular rhythm.   No murmur heard. Pulmonary/Chest: Effort normal and breath sounds normal. No respiratory distress. He has no wheezes. He has no rales. He exhibits no tenderness.  Musculoskeletal: He exhibits no edema.  Psychiatric: He has a normal mood and affect. His behavior is normal. Judgment and thought content normal.          Assessment & Plan:

## 2013-01-29 NOTE — Patient Instructions (Addendum)
Please schedule a follow up appointment in 6 months.   

## 2013-02-04 ENCOUNTER — Other Ambulatory Visit: Payer: Self-pay | Admitting: Internal Medicine

## 2013-02-06 ENCOUNTER — Other Ambulatory Visit: Payer: Self-pay

## 2013-02-08 ENCOUNTER — Telehealth: Payer: Self-pay

## 2013-02-08 NOTE — Telephone Encounter (Signed)
Patient has already been to the urologist

## 2013-02-08 NOTE — Telephone Encounter (Signed)
Please advise 

## 2013-02-08 NOTE — Telephone Encounter (Signed)
Left another message for patient to return my call to let us know if he would like to proceed with the Urology referral for his PSA increasing

## 2013-02-09 NOTE — Telephone Encounter (Signed)
So I cannot see any notes or labs from urology if you cannot find them ask patient when he last went and when they want to see him again, who did he see? This is a high enough number that he needs to be following it with urology

## 2013-02-10 NOTE — Telephone Encounter (Signed)
Left a detailed message on patients answering machine.

## 2013-02-10 NOTE — Telephone Encounter (Signed)
Pt has an appt made by Myriam Jacobson on 3-17 at [facility] with Dr Marcello Fennel.

## 2013-02-24 ENCOUNTER — Other Ambulatory Visit: Payer: Self-pay | Admitting: Internal Medicine

## 2013-03-16 ENCOUNTER — Other Ambulatory Visit: Payer: Self-pay | Admitting: Internal Medicine

## 2013-03-17 NOTE — Telephone Encounter (Signed)
Rx request to pharmacy/[facility]  

## 2013-04-12 ENCOUNTER — Encounter: Payer: Self-pay | Admitting: *Deleted

## 2013-04-12 ENCOUNTER — Other Ambulatory Visit: Payer: Self-pay | Admitting: Internal Medicine

## 2013-04-12 NOTE — Telephone Encounter (Signed)
Rx sent in to pharmacy. 

## 2013-04-29 ENCOUNTER — Other Ambulatory Visit: Payer: Self-pay | Admitting: Urology

## 2013-04-29 DIAGNOSIS — C61 Malignant neoplasm of prostate: Secondary | ICD-10-CM

## 2013-07-14 ENCOUNTER — Ambulatory Visit (HOSPITAL_COMMUNITY)
Admission: RE | Admit: 2013-07-14 | Discharge: 2013-07-14 | Disposition: A | Discharge status: Home / Self Care | Payer: 59 | Source: Ambulatory Visit | Attending: Urology | Admitting: Urology

## 2013-07-14 DIAGNOSIS — C61 Malignant neoplasm of prostate: Secondary | ICD-10-CM | POA: Insufficient documentation

## 2013-07-14 MED ORDER — GADOBENATE DIMEGLUMINE 529 MG/ML IV SOLN
20.0000 mL | Freq: Once | INTRAVENOUS | Status: AC | PRN
  Administered 2013-07-14: 19 mL via INTRAVENOUS

## 2013-07-28 ENCOUNTER — Encounter: Payer: Self-pay | Admitting: Family

## 2013-07-28 ENCOUNTER — Ambulatory Visit (INDEPENDENT_AMBULATORY_CARE_PROVIDER_SITE_OTHER): Payer: 59 | Admitting: Family

## 2013-07-28 VITALS — BP 120/80 | HR 73 | Temp 98.6°F | Resp 16 | Ht 68.0 in | Wt 197.0 lb

## 2013-07-28 DIAGNOSIS — I10 Essential (primary) hypertension: Secondary | ICD-10-CM

## 2013-07-28 DIAGNOSIS — C61 Malignant neoplasm of prostate: Secondary | ICD-10-CM

## 2013-07-28 DIAGNOSIS — E039 Hypothyroidism, unspecified: Secondary | ICD-10-CM

## 2013-07-28 DIAGNOSIS — E785 Hyperlipidemia, unspecified: Secondary | ICD-10-CM

## 2013-07-28 LAB — TSH: TSH: 2.963 u[IU]/mL (ref 0.350–4.500)

## 2013-07-28 LAB — HEPATIC FUNCTION PANEL
ALT: 27 U/L (ref 0–53)
AST: 24 U/L (ref 0–37)
Alkaline Phosphatase: 55 U/L (ref 39–117)
Bilirubin, Direct: 0.1 mg/dL (ref 0.0–0.3)
Indirect Bilirubin: 0.2 mg/dL (ref 0.0–0.9)

## 2013-07-28 LAB — BASIC METABOLIC PANEL WITH GFR
CO2: 30 mEq/L (ref 19–32)
Calcium: 10.3 mg/dL (ref 8.4–10.5)
Chloride: 100 mEq/L (ref 96–112)
Glucose, Bld: 111 mg/dL — ABNORMAL HIGH (ref 70–99)
Sodium: 139 mEq/L (ref 135–145)

## 2013-07-28 NOTE — Patient Instructions (Addendum)
Please repeat PSA the week of 01/17/14. Complete additional lab work today. Follow up in 6 months.

## 2013-07-28 NOTE — Assessment & Plan Note (Signed)
Being managed by [facility].  He reports that he will be due for follow PSA in 6 months and would like to have drawn at [facility].  I have placed order and asked pt to come to the lab the 3rd week in January.  Will plan to forward these results to [facility].

## 2013-07-28 NOTE — Assessment & Plan Note (Signed)
Clinically stable on synthroid, check TSH.  

## 2013-07-28 NOTE — Progress Notes (Signed)
Subjective:    Patient ID: Zachary [name], male    DOB: 15-Apr-1951, 62 y.o.   MRN: [id number]  HPI  Mr. Mitton is a 62 yr old male who presents today for follow up of multiple medical problems.  1) HTN- currently on lisinopril.   2) Hyperlipidemia- on pravastatin and fish oil.    3) Hypothyroid- currently maintained on levothyroxine .   4) prostate cancer-  Reports that he recently underwent an MRI of the prostate. He is currently being managed by surveillance. He is being followed by Dr. Retta Diones who is monitoring his PSA.    Review of Systems See HPI  Past Medical History  Diagnosis Date  . Hyperlipidemia   . Hypertension   . Insomnia   . Hypothyroidism   . GERD (gastroesophageal reflux disease)   . Arthritis   . Hemorrhoids   . Hearing problem     hearing deficit  . Diverticulitis     History   Social History  . Marital Status: Married    Spouse Name: [name]    Number of Children: 2  . 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 Smoking status: Never Smoker   . Smokeless tobacco: Never Used  . Alcohol Use: Yes  . Drug Use: No  . Sexually Active: Not on file   Other Topics Concern  . Not on file   Social History Narrative   Previously Loss adjuster, [employer]    Past Surgical History  Procedure Laterality Date  . Hemorrhoid surgery  2007  . Back surgery  1978    herniated disksurgery  . Tendon repair  June 06, 2011    right elbow, Dr. Eulah Pont    Family History  Problem Relation Age of Onset  . Heart disease Father   . Breast cancer Mother   . Cancer Brother   . Alcohol abuse Other   . Arthritis Other   . Cancer Other     Breast, Prostate  . Coronary artery disease Other   . Irritable bowel syndrome Other   . Cystic fibrosis Other     Allergies  Allergen Reactions  . Simvastatin     REACTION: muscle pain    Current  Outpatient Prescriptions on File Prior to Visit  Medication Sig Dispense Refill  . aspirin EC 81 MG EC tablet Take 81 mg by mouth daily.        Marland Kitchen levothyroxine (SYNTHROID, LEVOTHROID) 100 MCG tablet TAKE 1 TABLET (100 MCG TOTAL) BY MOUTH DAILY.  90 tablet  1  . lisinopril (PRINIVIL,ZESTRIL) 5 MG tablet TAKE 1 TABLET (5 MG TOTAL) BY MOUTH DAILY.  90 tablet  1  . loratadine (CLARITIN) 10 MG tablet Take 10 mg by mouth daily.        . Omega-3 Fatty Acids (FISH OIL) 1200 MG CAPS Take 2 capsules by mouth daily.        Marland Kitchen omeprazole-sodium bicarbonate (ZEGERID) 40-1100 MG per capsule TAKE 1 CAPSULE BY MOUTH DAILY BEFORE BREAKFAST.  90 capsule  1  . pravastatin (PRAVACHOL) 40 MG tablet TAKE 1/2 TABLET BY MOUTH DAILY.  45 tablet  1   No current facility-administered medications on file prior to visit.    BP 120/80  Pulse 73  Temp(Src) 98.6 F (37 C) (Oral)  Resp 16  Ht 5\' 8"  (1.727 m)  Wt 197 lb (89.359 kg)  BMI  29.96 kg/m2  SpO2 96%       Objective:   Physical Exam  Constitutional: He is oriented to person, place, and time. He appears well-developed and well-nourished. No distress.  Cardiovascular: Normal rate and regular rhythm.   No murmur heard. Pulmonary/Chest: Effort normal and breath sounds normal. No respiratory distress. He has no wheezes. He has no rales. He exhibits no tenderness.  Musculoskeletal: He exhibits no edema.  Neurological: He is alert and oriented to person, place, and time.  Psychiatric: He has a normal mood and affect. His behavior is normal. Judgment and thought content normal.          Assessment & Plan:

## 2013-07-28 NOTE — Assessment & Plan Note (Signed)
LDL at goal, check flp. Continue statin.

## 2013-07-28 NOTE — Assessment & Plan Note (Addendum)
BP Readings from Last 3 Encounters:  07/28/13 120/80  01/29/13 120/84  12/10/12 114/72   BP stable on current med. Continue same.

## 2013-07-30 ENCOUNTER — Telehealth: Payer: Self-pay | Admitting: Family

## 2013-07-30 NOTE — Telephone Encounter (Signed)
See my chart message

## 2013-08-02 ENCOUNTER — Other Ambulatory Visit: Payer: Self-pay | Admitting: Family

## 2013-08-17 ENCOUNTER — Encounter: Payer: Self-pay | Admitting: Pulmonary Disease

## 2013-08-17 ENCOUNTER — Ambulatory Visit (INDEPENDENT_AMBULATORY_CARE_PROVIDER_SITE_OTHER): Payer: 59 | Admitting: Pulmonary Disease

## 2013-08-17 VITALS — BP 108/66 | HR 67 | Temp 98.0°F | Ht 68.0 in | Wt 198.0 lb

## 2013-08-17 DIAGNOSIS — G4733 Obstructive sleep apnea (adult) (pediatric): Secondary | ICD-10-CM

## 2013-08-17 NOTE — Progress Notes (Signed)
  Subjective:    Patient ID: Zachary [name], male    DOB: 06-15-1951, 62 y.o.   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 Some bruxism was noted  9/12 > changed to 11 cm based on download    08/17/2013 Annual FU  mask Ok, pressure OK. No somnolence or snoring  Pt reports he wears his CPAP everynight x 6-7 hrs a night. Denies any problems w/ mask/machine. He reports he is feeling really exhausted during the day. He has not had recent download.  C/o headache last 3 wks since he started on new prostate medication. Also stopped drinking wine. - No sinus drainage No teeth grinding- dentist gave good report  Review of Systems neg for any significant sore throat, dysphagia, itching, sneezing, nasal congestion or excess/ purulent secretions, fever, chills, sweats, unintended wt loss, pleuritic or exertional cp, hempoptysis, orthopnea pnd or change in chronic leg swelling. Also denies presyncope, palpitations, heartburn, abdominal pain, nausea, vomiting, diarrhea or change in bowel or urinary habits, dysuria,hematuria, rash, arthralgias, visual complaints, headache, numbness weakness or ataxia.     Objective:   Physical Exam  Gen. Pleasant, well-nourished, in no distress ENT - no lesions, no post nasal drip Neck: No JVD, no thyromegaly, no carotid bruits Lungs: no use of accessory muscles, no dullness to percussion, clear without rales or rhonchi  Cardiovascular: Rhythm regular, heart sounds  normal, no murmurs or gallops, no peripheral edema Musculoskeletal: No deformities, no cyanosis or clubbing  Neuro -non focal        Assessment & Plan:

## 2013-08-17 NOTE — Patient Instructions (Addendum)
Lets check download on your CPAP If ok, can consider trial of Nuvigil to increase alertness If headaches persists contact PCP

## 2013-08-17 NOTE — Assessment & Plan Note (Signed)
Lets check download on your CPAP If ok, can consider trial of Nuvigil to increase alertness If headaches persists contact PCP  Weight loss encouraged, compliance with goal of at least 4-6 hrs every night is the expectation. Advised against medications with sedative side effects Cautioned against driving when sleepy - understanding that sleepiness will vary on a day to day basis

## 2013-08-20 ENCOUNTER — Telehealth: Payer: Self-pay | Admitting: Pulmonary Disease

## 2013-08-20 NOTE — Telephone Encounter (Signed)
Download 08/17/13 shows no residual events on 11 cm Can trial nuvigil 1/2 tab at 9am - if headaches have gone away & no other cause of tiredness found Ok to give sample x 1 wk, then report back

## 2013-08-24 ENCOUNTER — Other Ambulatory Visit: Payer: Self-pay | Admitting: Family

## 2013-08-25 NOTE — Telephone Encounter (Signed)
Returning call can be reached at [phone].Zachary [name]

## 2013-08-25 NOTE — Telephone Encounter (Signed)
I spoke with pt. Made him aware of recs. He will hold off on nuvigil for right now and will call back if he decides to try this. Nothing further needed

## 2013-08-25 NOTE — Telephone Encounter (Signed)
lmomtcb x1 for pt 

## 2013-09-15 ENCOUNTER — Encounter: Payer: Self-pay | Admitting: Physician Assistant

## 2013-09-15 ENCOUNTER — Ambulatory Visit (INDEPENDENT_AMBULATORY_CARE_PROVIDER_SITE_OTHER): Payer: 59 | Admitting: Physician Assistant

## 2013-09-15 VITALS — BP 114/78 | HR 63 | Temp 98.0°F | Resp 16 | Ht 68.0 in | Wt 197.2 lb

## 2013-09-15 DIAGNOSIS — L089 Local infection of the skin and subcutaneous tissue, unspecified: Secondary | ICD-10-CM

## 2013-09-15 DIAGNOSIS — L909 Atrophic disorder of skin, unspecified: Secondary | ICD-10-CM

## 2013-09-15 DIAGNOSIS — L918 Other hypertrophic disorders of the skin: Secondary | ICD-10-CM

## 2013-09-15 NOTE — Progress Notes (Signed)
Patient ID: Zachary [name], male   DOB: 08/08/1951, 62 y.o.   MRN: [id number]  Patient presents to clinic today c/o skin tag of L axillary region that has been irritated and painful for the past 4 days.  Patient has history of multiple skin tags but denies ever having trouble with one being painful.  Is not sure what he did to irritate the tag.  Denies fever, chills, swelling of L armpit.  Denies troublesome skin tag elsewhere. Is wondering is skin tag can be removed in office today.  Past Medical History  Diagnosis Date  . Hyperlipidemia   . Hypertension   . Insomnia   . Hypothyroidism   . GERD (gastroesophageal reflux disease)   . Arthritis   . Hemorrhoids   . Hearing problem     hearing deficit  . Diverticulitis     Current Outpatient Prescriptions on File Prior to Visit  Medication Sig Dispense Refill  . aspirin EC 81 MG EC tablet Take 81 mg by mouth daily.        Marland Kitchen levothyroxine (SYNTHROID, LEVOTHROID) 100 MCG tablet TAKE 1 TABLET (100 MCG TOTAL) BY MOUTH DAILY.  90 tablet  1  . lisinopril (PRINIVIL,ZESTRIL) 5 MG tablet TAKE 1 TABLET (5 MG TOTAL) BY MOUTH DAILY.  90 tablet  1  . loratadine (CLARITIN) 10 MG tablet Take 10 mg by mouth daily.        . mirabegron ER (MYRBETRIQ) 25 MG TB24 tablet Take 25 mg by mouth daily. ON HOLD      . Omega-3 Fatty Acids (FISH OIL) 1200 MG CAPS Take 2 capsules by mouth daily.        Marland Kitchen omeprazole-sodium bicarbonate (ZEGERID) 40-1100 MG per capsule TAKE 1 CAPSULE BY MOUTH DAILY BEFORE BREAKFAST.  90 capsule  1  . pravastatin (PRAVACHOL) 40 MG tablet TAKE 1/2 TABLET BY MOUTH DAILY.  45 tablet  1   No current facility-administered medications on file prior to visit.    Allergies  Allergen Reactions  . Simvastatin     REACTION: muscle pain    Family History  Problem Relation Age of Onset  . Heart disease Father   . Breast cancer Mother   . Cancer Brother   . Alcohol abuse Other   . Arthritis Other   . Cancer Other     Breast, Prostate   . Coronary artery disease Other   . Irritable bowel syndrome Other   . Cystic fibrosis Other     History   Social History  . Marital Status: Married    Spouse Name: [name]    Number of Children: 2  . 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 Smoking status: Never Smoker   . Smokeless tobacco: Never Used  . Alcohol Use: Yes  . Drug Use: No  . Sexual Activity: None   Other Topics Concern  . None   Social History Narrative   Previously Loss adjuster, chartered [employer]   ROS See HPI.  All other ROS are negative   Filed Vitals:   09/15/13 1414  BP: 114/78  Pulse: 63  Temp: 98 F (36.7 C)  Resp: 16   Physical Exam  Vitals reviewed. Constitutional: He is oriented to person, place, and time and well-developed, well-nourished, and in no distress.  HENT:  Head: Normocephalic and atraumatic.  Eyes: Conjunctivae are normal.  Neck: Neck supple.  Cardiovascular: Normal rate, regular rhythm and normal heart sounds.   Lymphadenopathy:    He has no cervical adenopathy.    He has no axillary adenopathy.  Neurological: He is alert and oriented to person, place, and time.  Skin: Skin is warm and dry.  Presence of numerous skin tags of axillary and nuchal regions.  Lesion in question is on L axillary region.  Lesion is erythematous and tender to palpation, pedunculated, consistent with benign skin tag growth.   Recent Results (from the past 2160 hour(s))  CREATININE, SERUM     Status: Abnormal   Collection Time    07/14/13  8:49 AM      Result Value Range   Creatinine, Ser 0.99  0.50 - 1.35 mg/dL   GFR calc non Af Amer 88 (*) >90 mL/min   GFR calc Af Amer >90  >90 mL/min   Comment:            The eGFR has been calculated     using the CKD EPI equation.     This calculation has not been     validated in all clinical     situations.     eGFR's persistently     <90 mL/min  signify     possible Chronic Kidney Disease.  BASIC METABOLIC PANEL WITH GFR     Status: Abnormal   Collection Time    07/28/13  3:06 PM      Result Value Range   Sodium 139  135 - 145 mEq/L   Potassium 4.0  3.5 - 5.3 mEq/L   Chloride 100  96 - 112 mEq/L   CO2 30  19 - 32 mEq/L   Glucose, Bld 111 (*) 70 - 99 mg/dL   BUN 19  6 - 23 mg/dL   Creat 4.69  6.29 - 5.28 mg/dL   Calcium 41.3  8.4 - 24.4 mg/dL   GFR, Est African American >89     GFR, Est Non African American 83     Comment:       The estimated GFR is a calculation valid for adults (>=104 years old)     that uses the CKD-EPI algorithm to adjust for age and sex. It is       not to be used for children, pregnant women, hospitalized patients,        patients on dialysis, or with rapidly changing kidney function.     According to the NKDEP, eGFR >89 is normal, 60-89 shows mild     impairment, 30-59 shows moderate impairment, 15-29 shows severe     impairment and <15 is ESRD.        HEPATIC FUNCTION PANEL     Status: None   Collection Time    07/28/13  3:06 PM      Result Value Range   Total Bilirubin 0.3  0.3 - 1.2 mg/dL   Bilirubin, Direct 0.1  0.0 - 0.3 mg/dL   Indirect Bilirubin 0.2  0.0 - 0.9 mg/dL   Alkaline Phosphatase 55  39 - 117 U/L   AST 24  0 - 37 U/L   ALT 27  0 - 53 U/L   Total Protein 7.2  6.0 - 8.3 g/dL   Albumin 4.5  3.5 - 5.2 g/dL  TSH     Status: None   Collection Time    07/28/13  3:06 PM      Result Value Range   TSH 2.963  0.350 - 4.500 uIU/mL  Assessment/Plan: Inflamed skin tag Skin tag base injected with lidocaine, then excised with derm blade.  Bandage applied to area.  Instructions for maintenance given to patient.

## 2013-09-15 NOTE — Assessment & Plan Note (Signed)
Skin tag base injected with lidocaine, then excised with derm blade.  Bandage applied to area.  Instructions for maintenance given to patient.

## 2013-09-15 NOTE — Patient Instructions (Signed)
Please keep area clean and dry.  Apply topical antibiotic ointment to area for the next couple of days.  Please call if the area is not healing properly.

## 2013-09-20 ENCOUNTER — Other Ambulatory Visit: Payer: Self-pay | Admitting: Family

## 2013-09-20 NOTE — Telephone Encounter (Signed)
Rx request to pharmacy/[facility]  

## 2013-10-28 ENCOUNTER — Other Ambulatory Visit: Payer: Self-pay

## 2013-12-20 ENCOUNTER — Other Ambulatory Visit: Payer: Self-pay | Admitting: Family

## 2013-12-23 DIAGNOSIS — C61 Malignant neoplasm of prostate: Secondary | ICD-10-CM

## 2013-12-23 HISTORY — DX: Malignant neoplasm of prostate: C61

## 2014-01-12 ENCOUNTER — Telehealth: Payer: Self-pay | Admitting: Family

## 2014-01-12 DIAGNOSIS — E039 Hypothyroidism, unspecified: Secondary | ICD-10-CM

## 2014-01-12 DIAGNOSIS — I10 Essential (primary) hypertension: Secondary | ICD-10-CM

## 2014-01-12 DIAGNOSIS — E785 Hyperlipidemia, unspecified: Secondary | ICD-10-CM

## 2014-01-12 NOTE — Telephone Encounter (Signed)
Patient wants to know if he needs to be fasting for upcoming lab work and he wants to know what types of testing has been ordered. Best # [phone]

## 2014-01-12 NOTE — Telephone Encounter (Signed)
Please advise what tests / dx pt should have before appt next week.?

## 2014-01-14 NOTE — Telephone Encounter (Signed)
Labs pended below. 

## 2014-01-17 NOTE — Telephone Encounter (Signed)
Pt came to the office this morning. Orders signed.

## 2014-01-19 ENCOUNTER — Ambulatory Visit: Payer: 59 | Admitting: Family

## 2014-01-26 ENCOUNTER — Encounter: Payer: Self-pay | Admitting: Family Medicine

## 2014-01-26 ENCOUNTER — Ambulatory Visit (INDEPENDENT_AMBULATORY_CARE_PROVIDER_SITE_OTHER): Payer: 59 | Admitting: Family Medicine

## 2014-01-26 VITALS — BP 119/71 | HR 75 | Ht 68.0 in | Wt 197.0 lb

## 2014-01-26 DIAGNOSIS — E039 Hypothyroidism, unspecified: Secondary | ICD-10-CM

## 2014-01-26 DIAGNOSIS — K219 Gastro-esophageal reflux disease without esophagitis: Secondary | ICD-10-CM

## 2014-01-26 DIAGNOSIS — C61 Malignant neoplasm of prostate: Secondary | ICD-10-CM

## 2014-01-26 DIAGNOSIS — M25519 Pain in unspecified shoulder: Secondary | ICD-10-CM

## 2014-01-26 DIAGNOSIS — E785 Hyperlipidemia, unspecified: Secondary | ICD-10-CM

## 2014-01-26 DIAGNOSIS — M25511 Pain in right shoulder: Secondary | ICD-10-CM

## 2014-01-26 DIAGNOSIS — I10 Essential (primary) hypertension: Secondary | ICD-10-CM

## 2014-01-26 MED ORDER — OMEPRAZOLE-SODIUM BICARBONATE 40-1100 MG PO CAPS: ORAL_CAPSULE | ORAL | Status: DC

## 2014-01-26 MED ORDER — LISINOPRIL 5 MG PO TABS: ORAL_TABLET | ORAL | Status: DC

## 2014-01-26 NOTE — Progress Notes (Signed)
CC: Zachary [name] is a 63 y.o. male is here for Establish Care   Subjective: HPI:  Pleasant 63 year old here to establish care last name pronounced "white hoe for" woodworking enthusiast  Reports a history of GERD that has been well managed on omeprazole for over 20 years now. He believes that he's been on this dose on a daily basis for at least 20 years. He reports subjective acid reflux if he misses a few doses of this. He currently denies any abdominal pain or substernal pain  History of hyperlipidemia: He has been on Pravachol in the recent past however stopped this about 5 months ago in hopes that he would not need anymore in the future. He tries to watch what he eats and tries to stay active.  He had an intolerance to simvastatin but was tolerating Pravachol without any known side effects.  History of hypertension: On chart review this appears to have been extremely well controlled at least over the past year on his current dose of lisinopril 5 mg. He denies known side effects. No outside blood pressures to report  History of hypothyroidism: It is uncertain when this started however he has been on 100 mcg of levothyroxine for at least 2 years with unremarkable TSHs.  He reports a history of prostate cancer that was diagnosed last year but is currently being managed by [facility].  His urologist is currently addressing this with surveillance only, involving PSAs every 6 months. Patient believes his last PSA was somewhere between 9 and 10 about 3 months ago and would like to know if we can check that for him today.  Patient reports in acute complaint of right shoulder pain has been present for one week worse when abducting the arm beyond approximately 60 abduction. Symptoms came on gradually without warning about a week ago has not been getting better or worsens onset. No interventions as of yet he is trying to avoid having to take anti-inflammatories. He denies any weakness motor or  sensory disturbances in the right upper extremity. Pain is localized within the right shoulder and is nonradiating. Denies swelling redness or warmth overlying the site of discomfort.  Review of Systems - General ROS: negative for - chills, fever, night sweats, weight gain or weight loss Ophthalmic ROS: negative for - decreased vision Psychological ROS: negative for - anxiety or depression ENT ROS: negative for - hearing change, nasal congestion, tinnitus or allergies Hematological and Lymphatic ROS: negative for - bleeding problems, bruising or swollen lymph nodes Breast ROS: negative Respiratory ROS: no cough, shortness of breath, or wheezing Cardiovascular ROS: no chest pain or dyspnea on exertion Gastrointestinal ROS: no abdominal pain, change in bowel habits, or black or bloody stools Genito-Urinary ROS: negative for - genital discharge, genital ulcers, incontinence or abnormal bleeding from genitals Musculoskeletal ROS: negative for - joint pain or muscle pain other than that described above Neurological ROS: negative for - headaches or memory loss Dermatological ROS: negative for lumps, mole changes, rash and skin lesion changes  Past Medical History  Diagnosis Date  . Hyperlipidemia   . Hypertension   . Insomnia   . Hypothyroidism   . GERD (gastroesophageal reflux disease)   . Arthritis   . Hemorrhoids   . Hearing problem     hearing deficit  . Diverticulitis     Past Surgical History  Procedure Laterality Date  . Hemorrhoid surgery  08/2006  . Back surgery  1978    herniated disksurgery  . Tendon repair  June 06, 2011    right elbow, Dr. Percell Miller  . Cardiac catheterization  08/01/2010   Family History  Problem Relation Age of Onset  . Heart disease Father   . Breast cancer Mother   . Cancer Brother   . Alcohol abuse Other   . Arthritis Other   . Cancer Other     Breast, Prostate  . Coronary artery disease Other   . Irritable bowel syndrome Other   . Cystic  fibrosis Other     History   Social History  . Marital Status: Married    Spouse Name: Neoma Laming    Number of Children: 2  . 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 Smoking status: Never Smoker   . Smokeless tobacco: Never Used  . Alcohol Use: Yes  . Drug Use: No  . Sexual Activity: Not on file   Other Topics Concern  . Not on file   Social History Narrative   Previously Chartered loss adjuster [employer]     Objective: BP 119/71  Pulse 75  Ht 5\' 8"  (1.727 m)  Wt 197 lb (89.359 kg)  BMI 29.96 kg/m2  General: Alert and Oriented, No Acute Distress HEENT: Pupils equal, round, reactive to light. Conjunctivae clear.  Moist mucous membranes pharynx unremarkable Lungs: Clear to auscultation bilaterally, no wheezing/ronchi/rales.  Comfortable work of breathing. Good air movement. Cardiac: Regular rate and rhythm. Normal S1/S2.  No murmurs, rubs, nor gallops. No carotid bruits  Extremities: No peripheral edema.  Strong peripheral pulses. Right shoulder exam reveals limited abduction of the humerus actively beyond 80 otherwise full range of motion and strength in all planes of motion and with individual rotator cuff testing. No overlying redness warmth or swelling.  Unable to assess Neer test due to pain.  Hawkins test positive. Empty can positive. Crossarm test negative. O'Brien's test negative. Apprehension test negative. Mental Status: No depression, anxiety, nor agitation. Skin: Warm and dry.  Assessment & Plan: Zachary [name] was seen today for establish care.  Diagnoses and associated orders for this visit:  Prostate cancer - PSA  GERD - omeprazole-sodium bicarbonate (ZEGERID) 40-1100 MG per capsule; TAKE 1 CAPSULE BY MOUTH DAILY BEFORE BREAKFAST.  HYPERLIPIDEMIA - Lipid panel  HYPERTENSION - BASIC METABOLIC PANEL WITH GFR - lisinopril (PRINIVIL,ZESTRIL) 5 MG tablet; TAKE 1  TABLET (5 MG TOTAL) BY MOUTH DAILY.  HYPOTHYROIDISM - TSH  Right shoulder pain    Prostate cancer: Appears stable we'll defer ultimate care to his urologist however per patient request checking PSA today GERD: Controlled continue omeprazole bicarbonate Hyperlipidemia: We will check lipid panel and calculate his AHA 10 year risk to determine if he needs to go back on the Pravachol Hypertension: Controlled continue low-dose lisinopril checking renal function and potassium Hypothyroidism: Clinically controlled however due for routine TSH check Right shoulder pain: Suspect supraspinatus tendinopathy he was encouraged to start on ibuprofen 800 mg 3 times a day as needed along with daily home exercise regimen which was provided to him in a handout if no improvement in 2-3 weeks can return for x-ray to evaluate candidacy for subacromial steroid injection  45 minutes spent face-to-face during visit today of which at least 50% was counseling or coordinating care regarding: 1. Prostate cancer   2. GERD   3. HYPERLIPIDEMIA   4. HYPERTENSION   5. HYPOTHYROIDISM   6. Right shoulder pain  Return in about 3 months (around 04/25/2014).

## 2014-01-27 LAB — BASIC METABOLIC PANEL WITH GFR
BUN: 19 mg/dL (ref 6–23)
CHLORIDE: 101 meq/L (ref 96–112)
CO2: 28 meq/L (ref 19–32)
Calcium: 9.9 mg/dL (ref 8.4–10.5)
Creat: 1.05 mg/dL (ref 0.50–1.35)
GFR, Est African American: 89 mL/min
GFR, Est Non African American: 77 mL/min
GLUCOSE: 97 mg/dL (ref 70–99)
POTASSIUM: 4.5 meq/L (ref 3.5–5.3)
SODIUM: 138 meq/L (ref 135–145)

## 2014-01-27 LAB — LIPID PANEL
Cholesterol: 170 mg/dL (ref 0–200)
HDL: 44 mg/dL (ref >39)
LDL Cholesterol: 111 mg/dL — ABNORMAL HIGH (ref 0–99)
Total CHOL/HDL Ratio: 3.9 Ratio
Triglycerides: 77 mg/dL (ref <150)
VLDL: 15 mg/dL (ref 0–40)

## 2014-01-27 LAB — TSH: TSH: 2.721 u[IU]/mL (ref 0.350–4.500)

## 2014-01-28 ENCOUNTER — Telehealth: Payer: Self-pay | Admitting: Family Medicine

## 2014-01-28 DIAGNOSIS — E785 Hyperlipidemia, unspecified: Secondary | ICD-10-CM

## 2014-01-28 LAB — PSA: PSA: 8.83 ng/mL — AB (ref <=4.00)

## 2014-01-28 MED ORDER — PRAVASTATIN SODIUM 40 MG PO TABS: ORAL_TABLET | ORAL | Status: DC

## 2014-01-28 MED ORDER — LEVOTHYROXINE SODIUM 100 MCG PO TABS: ORAL_TABLET | ORAL | Status: DC

## 2014-01-28 NOTE — Telephone Encounter (Signed)
Pt notified and voiced understanding will send PSA labs to [facility] per pt request through Epic

## 2014-01-28 NOTE — Telephone Encounter (Addendum)
Zachary [name], Will you please let Mr. Schildt know that his LDL cholesterol was elevated at 111.  Based on his cardiac risk factors his risk of having a heart attack in the next 10 years is 8.4%.  Current guidelines recommend any individual with a risk greater than 7.5% to be on a statin to lower cholesterol.  Therefore I've sent in refills of pravastatin to the [facility] [place].  His PSA was 8.8, lower than his last value from his urologist. TSH normal so refills of levothyroxine sent in. Blood sugar was normal and kidney function stable.  I'd encourage f/u in 3 months.

## 2014-01-31 ENCOUNTER — Telehealth: Payer: Self-pay | Admitting: Family Medicine

## 2014-01-31 NOTE — Telephone Encounter (Signed)
Results faxed to [phone].  Oscar La, LPN

## 2014-01-31 NOTE — Telephone Encounter (Signed)
Seth Bake,  Per patient request, can you: "Could you forward my recent PSA results to Dr. Diona Fanti at [facility] in [place]."

## 2014-02-10 ENCOUNTER — Ambulatory Visit (INDEPENDENT_AMBULATORY_CARE_PROVIDER_SITE_OTHER): Payer: 59 | Admitting: Family Medicine

## 2014-02-10 ENCOUNTER — Encounter: Payer: Self-pay | Admitting: Family Medicine

## 2014-02-10 VITALS — BP 116/77 | HR 63 | Ht 68.0 in | Wt 197.0 lb

## 2014-02-10 DIAGNOSIS — M25511 Pain in right shoulder: Secondary | ICD-10-CM

## 2014-02-10 DIAGNOSIS — M25519 Pain in unspecified shoulder: Secondary | ICD-10-CM

## 2014-02-10 NOTE — Patient Instructions (Signed)
You have a rotator cuff tendinitis and frozen shoulder (adhesive capsulitis), a buildup of scar tissue that limits motion of the shoulder joint. Limit lifting and overhead activities as much as possible. Heat 15 minutes at a time 3-4 times a day may help with movement and stiffness. Tylenol and/or aleve as needed for pain and inflammation. Steroid injections in a series have been shown to help with pain and motion - you were given this today. Codman exercises (pendulum, table slides, arm circles) - do 3 sets of 10 daily of each of these. Physical therapy for rotator cuff strengthening is a consideration once you are out of the painful phase Follow up in 6 weeks. Let me know if you need something stronger for pain.

## 2014-02-11 ENCOUNTER — Encounter: Payer: Self-pay | Admitting: Family Medicine

## 2014-02-11 DIAGNOSIS — M25511 Pain in right shoulder: Secondary | ICD-10-CM | POA: Insufficient documentation

## 2014-02-11 NOTE — Progress Notes (Addendum)
Patient ID: Zachary [name], male   DOB: 1951/01/29, 64 y.o.   MRN: [id number]  PCP: Nance Pear., NP  Subjective:   HPI: Patient is a 63 y.o. male here for right shoulder pain.  Patient denies known injury. He reports also hasn't increased his activity level or done anything different prior to onset of pain. Started insidiously about 3 weeks ago and progressive. Hard to sleep at night, lie on right shoulder. Tried ibuprofen. Worse with all motions. No swelling or bruising. No numbness/tingling or neck pain.  Past Medical History  Diagnosis Date  . Hyperlipidemia   . Hypertension   . Insomnia   . Hypothyroidism   . GERD (gastroesophageal reflux disease)   . Arthritis   . Hemorrhoids   . Hearing problem     hearing deficit  . Diverticulitis     Current Outpatient Prescriptions on File Prior to Visit  Medication Sig Dispense Refill  . aspirin EC 81 MG EC tablet Take 81 mg by mouth daily.        Marland Kitchen levothyroxine (SYNTHROID, LEVOTHROID) 100 MCG tablet TAKE 1 TABLET BY MOUTH ONCE DAILY  90 tablet  1  . lisinopril (PRINIVIL,ZESTRIL) 5 MG tablet TAKE 1 TABLET (5 MG TOTAL) BY MOUTH DAILY.  90 tablet  1  . loratadine (CLARITIN) 10 MG tablet Take 10 mg by mouth daily.        Marland Kitchen omeprazole-sodium bicarbonate (ZEGERID) 40-1100 MG per capsule TAKE 1 CAPSULE BY MOUTH DAILY BEFORE BREAKFAST.  90 capsule  1  . pravastatin (PRAVACHOL) 40 MG tablet TAKE 1/2 TABLET BY MOUTH DAILY.  45 tablet  1   No current facility-administered medications on file prior to visit.    Past Surgical History  Procedure Laterality Date  . Hemorrhoid surgery  08/2006  . Back surgery  1978    herniated disksurgery  . Tendon repair  June 06, 2011    right elbow, Dr. Percell Miller  . Cardiac catheterization  08/01/2010    Allergies  Allergen Reactions  . Simvastatin     REACTION: muscle pain    History   Social History  . Marital Status: Married    Spouse Name: Neoma Laming    Number of Children: 2   . 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 Smoking status: Never Smoker   . Smokeless tobacco: Never Used  . Alcohol Use: Yes  . Drug Use: No  . Sexual Activity: Not on file   Other Topics Concern  . Not on file   Social History Narrative   Previously Chartered loss adjuster [employer]    Family History  Problem Relation Age of Onset  . Heart disease Father   . Breast cancer Mother   . Cancer Brother   . Alcohol abuse Other   . Arthritis Other   . Cancer Other     Breast, Prostate  . Coronary artery disease Other   . Irritable bowel syndrome Other   . Cystic fibrosis Other     BP 116/77  Pulse 63  Ht 5\' 8"  (1.727 m)  Wt 197 lb (89.359 kg)  BMI 29.96 kg/m2  Review of Systems: See HPI above.    Objective:  Physical Exam:  Gen: NAD  Right shoulder: No swelling, ecchymoses.  No gross deformity. No TTP. ER 45 degrees, abduction and flexion 90 degrees, painful both passive and active motion compared to  FROM on left including 90 degrees abduction. Positive Hawkins, Neers. Negative Yergasons. Strength 5/5 with empty can and resisted internal/external rotation.  Pain with all of these motions. Negative apprehension. NV intact distally.    Assessment & Plan:  1. Right shoulder pain - rotator cuff tendinitis and adhesive capsulitis.  Combination intraarticular and subacromial injection given today.  Heat, tylenol, nsaids.  Codman exercises reviewed.  F/u in 6 weeks - consider repeating intraarticular injection at that time.  After informed written consent, patient was seated on exam table. Right shoulder was prepped with alcohol swab and utilizing posterior approach, patient's right shoulder was injected with 6:2 marcaine:depomedrol with half in the subacromial space and half in glenohumeral space.  Patient tolerated the procedure well without immediate  complications.  Addendum:  Patient let us know he unfortunately has not improved with injection and doing home exercises.  Will go ahead with MRI to assess for rotator cuff tear, other pathology.  Addendum:  MRI reviewed and discussed with patient.  He does have bursitis as expected.  However he also has a focal partial thickness supraspinatus tear.  This is high grade, through most of the tendon on bursal side.  Given severity of his pain and not improving with conservative measures will have him see Dr. Percell Miller again to discuss whether to pursue repair at this point.

## 2014-02-11 NOTE — Assessment & Plan Note (Signed)
rotator cuff tendinitis and adhesive capsulitis.  Combination intraarticular and subacromial injection given today.  Heat, tylenol, nsaids.  Codman exercises reviewed.  F/u in 6 weeks - consider repeating intraarticular injection at that time.  After informed written consent, patient was seated on exam table. Right shoulder was prepped with alcohol swab and utilizing posterior approach, patient's right shoulder was injected with 6:2 marcaine:depomedrol with half in the subacromial space and half in glenohumeral space.  Patient tolerated the procedure well without immediate complications.

## 2014-03-03 NOTE — Addendum Note (Signed)
Addended by: Dene Gentry on: 03/03/2014 01:57 PM   Modules accepted: Orders

## 2014-03-05 ENCOUNTER — Ambulatory Visit (HOSPITAL_BASED_OUTPATIENT_CLINIC_OR_DEPARTMENT_OTHER)
Admission: RE | Admit: 2014-03-05 | Discharge: 2014-03-05 | Disposition: A | Discharge status: Home / Self Care | Payer: 59 | Source: Ambulatory Visit | Attending: Family Medicine | Admitting: Family Medicine

## 2014-03-05 DIAGNOSIS — S46819A Strain of other muscles, fascia and tendons at shoulder and upper arm level, unspecified arm, initial encounter: Secondary | ICD-10-CM | POA: Insufficient documentation

## 2014-03-05 DIAGNOSIS — X58XXXA Exposure to other specified factors, initial encounter: Secondary | ICD-10-CM | POA: Insufficient documentation

## 2014-03-05 DIAGNOSIS — M25511 Pain in right shoulder: Secondary | ICD-10-CM

## 2014-03-05 DIAGNOSIS — M719 Bursopathy, unspecified: Secondary | ICD-10-CM | POA: Insufficient documentation

## 2014-03-05 DIAGNOSIS — M67919 Unspecified disorder of synovium and tendon, unspecified shoulder: Secondary | ICD-10-CM | POA: Insufficient documentation

## 2014-03-07 NOTE — Addendum Note (Signed)
Addended by: Dene Gentry on: 03/07/2014 04:27 PM   Modules accepted: Orders

## 2014-03-08 ENCOUNTER — Other Ambulatory Visit: Payer: Self-pay | Admitting: Physician Assistant

## 2014-03-09 ENCOUNTER — Other Ambulatory Visit: Payer: Self-pay

## 2014-03-09 ENCOUNTER — Encounter (HOSPITAL_BASED_OUTPATIENT_CLINIC_OR_DEPARTMENT_OTHER): Payer: Self-pay | Admitting: *Deleted

## 2014-03-09 ENCOUNTER — Encounter (HOSPITAL_BASED_OUTPATIENT_CLINIC_OR_DEPARTMENT_OTHER)
Admission: RE | Admit: 2014-03-09 | Discharge: 2014-03-09 | Disposition: A | Discharge status: Home / Self Care | Payer: 59 | Source: Ambulatory Visit | Attending: Orthopedic Surgery | Admitting: Orthopedic Surgery

## 2014-03-09 LAB — BASIC METABOLIC PANEL
BUN: 19 mg/dL (ref 6–23)
CALCIUM: 10.1 mg/dL (ref 8.4–10.5)
CO2: 26 mEq/L (ref 19–32)
Chloride: 99 mEq/L (ref 96–112)
Creatinine, Ser: 0.94 mg/dL (ref 0.50–1.35)
GFR calc non Af Amer: 89 mL/min — ABNORMAL LOW (ref >90)
GLUCOSE: 105 mg/dL — AB (ref 70–99)
Potassium: 4.3 mEq/L (ref 3.7–5.3)
SODIUM: 139 meq/L (ref 137–147)

## 2014-03-09 NOTE — Progress Notes (Signed)
To come in for [id number] bring all meds and cpap and overnight bag in case he has to stay-

## 2014-03-09 NOTE — H&P (Signed)
  [facility] [address] [place], [place] [address] [phone] A Division of [facility]  Ninetta Lights, M.D.   Robert A. Noemi Chapel, M.D.   Faythe Casa, M.D.   Johnny Bridge, M.D.   Almedia Balls, M.D. Ernesta Amble. Percell Miller, M.D.  Joseph Pierini, M.D.  Lanier Prude, M.D.    Verner Chol, M.D. Mary L. Fenton Malling, PA-C  Kirstin A. Shepperson, PA-C  Josh Russell, PA-C [place], [place]  RE: Zachary [name], Zachary [name]   [id number]      DOB: 1951-07-25 PROGRESS NOTE: 03-08-14  HPI: Zachary [name] presents with right shoulder pain for the past three months.  No known injury and no previous injury noted to that shoulder.  The pain has progressively worsened over the past few weeks.  The pain is located through the entire shoulder and it radiates to the elbow.  He has a constant dull ache and feels as though his range of motion is insufficient.   He has rest pain and night pain and pain lifting overhead.  He did seed his primary care doctor approximately one month ago and was given a subacromial cortisone injection.  Cordie states that he did not get any relief even from the numbing medicine from this injection.  His primary care physician ordered an MRI which showed a deep transverse partial thickness bursal surface tear of the distal supraspinatus tendon.  Also it was noted that he had an overlying subacromial, subdeltoid bursitis.    Past Medical, Family and Social History are reviewed in detail on patient questionnaire and signed. Review of Systems as detailed in HPI; all others reviewed and are negative.    EXAMINATION: Well developed, well-nourished male in no acute distress.  Alert and oriented x 3.   Examination of his right shoulder reveals active range of motion to about 90 degrees with forward flexion 70 degrees with abduction.  He can internally rotate to about the level of S1.  Passive range of motion is limited due to  the patient's pain and apprehension.  Positive Hawkins, positive empty can and negative drop arm.  3/5 strength throughout.  X-RAYS: 3-views right shoulder reveals Type II acromion.  Fairly significant spur noted at the [facility] joint.   IMPRESSION: Right shoulder rotator cuff tear and rotator cuff bursitis.  PLAN: We are going to go ahead and proceed with right shoulder arthroscopy with distal clavicle excision, subacromial decompression and rotator cuff repair.  Today we discussed the procedure as well as risks, benefits and complications of the operation.  Rehab and recovery time discussed as well.    Ninetta Lights, M.D.  Electronically verified by Ninetta Lights, M.D. DFM(LA):gde D 03-08-14 T 03-09-14

## 2014-03-10 ENCOUNTER — Encounter (HOSPITAL_BASED_OUTPATIENT_CLINIC_OR_DEPARTMENT_OTHER): Payer: 59 | Admitting: Anesthesiology

## 2014-03-10 ENCOUNTER — Ambulatory Visit (HOSPITAL_BASED_OUTPATIENT_CLINIC_OR_DEPARTMENT_OTHER)
Admission: RE | Admit: 2014-03-10 | Discharge: 2014-03-10 | Disposition: A | Discharge status: Home / Self Care | Payer: 59 | Source: Ambulatory Visit | Attending: Orthopedic Surgery | Admitting: Orthopedic Surgery

## 2014-03-10 ENCOUNTER — Encounter (HOSPITAL_BASED_OUTPATIENT_CLINIC_OR_DEPARTMENT_OTHER)
Admission: RE | Disposition: A | Discharge status: Home / Self Care | Payer: Self-pay | Source: Ambulatory Visit | Attending: Orthopedic Surgery

## 2014-03-10 ENCOUNTER — Ambulatory Visit (HOSPITAL_BASED_OUTPATIENT_CLINIC_OR_DEPARTMENT_OTHER): Payer: 59 | Admitting: Anesthesiology

## 2014-03-10 ENCOUNTER — Encounter (HOSPITAL_BASED_OUTPATIENT_CLINIC_OR_DEPARTMENT_OTHER): Payer: Self-pay | Admitting: Anesthesiology

## 2014-03-10 DIAGNOSIS — M719 Bursopathy, unspecified: Principal | ICD-10-CM | POA: Insufficient documentation

## 2014-03-10 DIAGNOSIS — M7580 Other shoulder lesions, unspecified shoulder: Secondary | ICD-10-CM

## 2014-03-10 DIAGNOSIS — M19019 Primary osteoarthritis, unspecified shoulder: Secondary | ICD-10-CM | POA: Insufficient documentation

## 2014-03-10 DIAGNOSIS — M67919 Unspecified disorder of synovium and tendon, unspecified shoulder: Secondary | ICD-10-CM | POA: Insufficient documentation

## 2014-03-10 DIAGNOSIS — S43429A Sprain of unspecified rotator cuff capsule, initial encounter: Secondary | ICD-10-CM

## 2014-03-10 DIAGNOSIS — M25819 Other specified joint disorders, unspecified shoulder: Secondary | ICD-10-CM | POA: Insufficient documentation

## 2014-03-10 DIAGNOSIS — G4730 Sleep apnea, unspecified: Secondary | ICD-10-CM | POA: Insufficient documentation

## 2014-03-10 DIAGNOSIS — I10 Essential (primary) hypertension: Secondary | ICD-10-CM | POA: Insufficient documentation

## 2014-03-10 DIAGNOSIS — K219 Gastro-esophageal reflux disease without esophagitis: Secondary | ICD-10-CM | POA: Insufficient documentation

## 2014-03-10 DIAGNOSIS — E039 Hypothyroidism, unspecified: Secondary | ICD-10-CM | POA: Insufficient documentation

## 2014-03-10 DIAGNOSIS — IMO0002 Reserved for concepts with insufficient information to code with codable children: Secondary | ICD-10-CM | POA: Insufficient documentation

## 2014-03-10 DIAGNOSIS — M75100 Unspecified rotator cuff tear or rupture of unspecified shoulder, not specified as traumatic: Secondary | ICD-10-CM | POA: Insufficient documentation

## 2014-03-10 HISTORY — DX: Presence of external hearing-aid: Z97.4

## 2014-03-10 HISTORY — DX: Malignant (primary) neoplasm, unspecified: C80.1

## 2014-03-10 HISTORY — DX: Sleep apnea, unspecified: G47.30

## 2014-03-10 HISTORY — DX: Presence of spectacles and contact lenses: Z97.3

## 2014-03-10 HISTORY — PX: SHOULDER ARTHROSCOPY WITH SUBACROMIAL DECOMPRESSION, ROTATOR CUFF REPAIR AND BICEP TENDON REPAIR: SHX5687

## 2014-03-10 LAB — POCT HEMOGLOBIN-HEMACUE: Hemoglobin: 16.6 g/dL (ref 13.0–17.0)

## 2014-03-10 SURGERY — SHOULDER ARTHROSCOPY WITH SUBACROMIAL DECOMPRESSION, ROTATOR CUFF REPAIR AND BICEP TENDON REPAIR
Anesthesia: General | Site: Shoulder | Laterality: Right

## 2014-03-10 MED ORDER — METOCLOPRAMIDE HCL 5 MG/ML IJ SOLN: 10.0000 mg | Freq: Once | INTRAMUSCULAR | Status: DC | PRN

## 2014-03-10 MED ORDER — CEFAZOLIN SODIUM-DEXTROSE 2-3 GM-% IV SOLR
INTRAVENOUS | Status: DC | PRN
  Administered 2014-03-10: 2 g via INTRAVENOUS

## 2014-03-10 MED ORDER — SODIUM CHLORIDE 0.9 % IR SOLN
Status: DC | PRN
  Administered 2014-03-10: 24000 mL

## 2014-03-10 MED ORDER — DEXAMETHASONE SODIUM PHOSPHATE 4 MG/ML IJ SOLN
INTRAMUSCULAR | Status: DC | PRN
  Administered 2014-03-10: 10 mg via INTRAVENOUS

## 2014-03-10 MED ORDER — BUPIVACAINE HCL (PF) 0.25 % IJ SOLN
INTRAMUSCULAR | Status: AC
  Filled 2014-03-10: qty 30

## 2014-03-10 MED ORDER — CHLORHEXIDINE GLUCONATE 4 % EX LIQD: 60.0000 mL | Freq: Once | CUTANEOUS | Status: DC

## 2014-03-10 MED ORDER — LACTATED RINGERS IV SOLN
INTRAVENOUS | Status: DC
  Administered 2014-03-10 (×3): via INTRAVENOUS

## 2014-03-10 MED ORDER — EPHEDRINE SULFATE 50 MG/ML IJ SOLN
INTRAMUSCULAR | Status: DC | PRN
  Administered 2014-03-10: 10 mg via INTRAVENOUS
  Administered 2014-03-10: 5 mg via INTRAVENOUS

## 2014-03-10 MED ORDER — LIDOCAINE HCL (CARDIAC) 20 MG/ML IV SOLN
INTRAVENOUS | Status: DC | PRN
  Administered 2014-03-10: 40 mg via INTRAVENOUS

## 2014-03-10 MED ORDER — OXYCODONE-ACETAMINOPHEN 5-325 MG PO TABS: 1.0000 | ORAL_TABLET | ORAL | Status: DC | PRN

## 2014-03-10 MED ORDER — ONDANSETRON HCL 4 MG/2ML IJ SOLN
INTRAMUSCULAR | Status: DC | PRN
  Administered 2014-03-10: 4 mg via INTRAVENOUS

## 2014-03-10 MED ORDER — CEFAZOLIN SODIUM-DEXTROSE 2-3 GM-% IV SOLR
INTRAVENOUS | Status: AC
  Filled 2014-03-10: qty 50

## 2014-03-10 MED ORDER — PROPOFOL 10 MG/ML IV BOLUS
INTRAVENOUS | Status: DC | PRN
  Administered 2014-03-10: 50 mg via INTRAVENOUS
  Administered 2014-03-10: 200 mg via INTRAVENOUS

## 2014-03-10 MED ORDER — FENTANYL CITRATE 0.05 MG/ML IJ SOLN
50.0000 ug | INTRAMUSCULAR | Status: DC | PRN
  Administered 2014-03-10: 100 ug via INTRAVENOUS

## 2014-03-10 MED ORDER — HYDROMORPHONE HCL PF 1 MG/ML IJ SOLN: 0.2500 mg | INTRAMUSCULAR | Status: DC | PRN

## 2014-03-10 MED ORDER — MIDAZOLAM HCL 2 MG/2ML IJ SOLN
INTRAMUSCULAR | Status: AC
  Filled 2014-03-10: qty 2

## 2014-03-10 MED ORDER — FENTANYL CITRATE 0.05 MG/ML IJ SOLN
INTRAMUSCULAR | Status: DC | PRN
  Administered 2014-03-10: 50 ug via INTRAVENOUS

## 2014-03-10 MED ORDER — FENTANYL CITRATE 0.05 MG/ML IJ SOLN: INTRAMUSCULAR | Status: DC | PRN

## 2014-03-10 MED ORDER — OXYCODONE HCL 5 MG PO TABS: 5.0000 mg | ORAL_TABLET | Freq: Once | ORAL | Status: DC | PRN

## 2014-03-10 MED ORDER — FENTANYL CITRATE 0.05 MG/ML IJ SOLN
INTRAMUSCULAR | Status: AC
  Filled 2014-03-10: qty 4

## 2014-03-10 MED ORDER — MIDAZOLAM HCL 2 MG/2ML IJ SOLN
1.0000 mg | INTRAMUSCULAR | Status: DC | PRN
  Administered 2014-03-10: 2 mg via INTRAVENOUS

## 2014-03-10 MED ORDER — MIDAZOLAM HCL 5 MG/5ML IJ SOLN
INTRAMUSCULAR | Status: DC | PRN
  Administered 2014-03-10: 2 mg via INTRAVENOUS

## 2014-03-10 MED ORDER — SUCCINYLCHOLINE CHLORIDE 20 MG/ML IJ SOLN
INTRAMUSCULAR | Status: DC | PRN
  Administered 2014-03-10: 50 mg via INTRAVENOUS

## 2014-03-10 MED ORDER — CEFAZOLIN SODIUM-DEXTROSE 2-3 GM-% IV SOLR: 2.0000 g | INTRAVENOUS | Status: DC

## 2014-03-10 MED ORDER — BISACODYL 5 MG PO TBEC: 5.0000 mg | DELAYED_RELEASE_TABLET | Freq: Every day | ORAL | Status: DC | PRN

## 2014-03-10 MED ORDER — LACTATED RINGERS IV SOLN: INTRAVENOUS | Status: DC

## 2014-03-10 MED ORDER — OXYCODONE HCL 5 MG/5ML PO SOLN: 5.0000 mg | Freq: Once | ORAL | Status: DC | PRN

## 2014-03-10 MED ORDER — FENTANYL CITRATE 0.05 MG/ML IJ SOLN
INTRAMUSCULAR | Status: AC
  Filled 2014-03-10: qty 2

## 2014-03-10 MED ORDER — PHENYLEPHRINE HCL 10 MG/ML IJ SOLN
INTRAMUSCULAR | Status: DC | PRN
  Administered 2014-03-10 (×2): 100 ug via INTRAVENOUS

## 2014-03-10 MED ORDER — ONDANSETRON HCL 4 MG PO TABS: 4.0000 mg | ORAL_TABLET | Freq: Three times a day (TID) | ORAL | Status: DC | PRN

## 2014-03-10 SURGICAL SUPPLY — 72 items
ANCHOR PEEK SWIVEL LOCK 5.5 (Anchor) ×6 IMPLANT
BENZOIN TINCTURE PRP APPL 2/3 (GAUZE/BANDAGES/DRESSINGS) IMPLANT
BLADE CUTTER GATOR 3.5 (BLADE) ×3 IMPLANT
BLADE CUTTER MENIS 5.5 (BLADE) IMPLANT
BLADE GREAT WHITE 4.2 (BLADE) ×2 IMPLANT
BLADE GREAT WHITE 4.2MM (BLADE) ×1
BLADE SURG 15 STRL LF DISP TIS (BLADE) ×1 IMPLANT
BLADE SURG 15 STRL SS (BLADE) ×2
BUR OVAL 6.0 (BURR) ×3 IMPLANT
CANISTER SUCT 3000ML (MISCELLANEOUS) IMPLANT
CANNULA DRY DOC 8X75 (CANNULA) IMPLANT
CANNULA TWIST IN 8.25X7CM (CANNULA) IMPLANT
CLOSURE WOUND 1/2 X4 (GAUZE/BANDAGES/DRESSINGS)
DECANTER SPIKE VIAL GLASS SM (MISCELLANEOUS) IMPLANT
DRAPE STERI 35X30 U-POUCH (DRAPES) ×3 IMPLANT
DRAPE U-SHAPE 47X51 STRL (DRAPES) ×3 IMPLANT
DRAPE U-SHAPE 76X120 STRL (DRAPES) ×6 IMPLANT
DRSG PAD ABDOMINAL 8X10 ST (GAUZE/BANDAGES/DRESSINGS) ×3 IMPLANT
DURAPREP 26ML APPLICATOR (WOUND CARE) ×3 IMPLANT
ELECT MENISCUS 165MM 90D (ELECTRODE) ×3 IMPLANT
ELECT NEEDLE TIP 2.8 STRL (NEEDLE) IMPLANT
ELECT REM PT RETURN 9FT ADLT (ELECTROSURGICAL) ×3
ELECTRODE REM PT RTRN 9FT ADLT (ELECTROSURGICAL) ×1 IMPLANT
GAUZE XEROFORM 1X8 LF (GAUZE/BANDAGES/DRESSINGS) ×3 IMPLANT
GLOVE BIO SURGEON STRL SZ7 (GLOVE) ×3 IMPLANT
GLOVE BIOGEL PI IND STRL 7.5 (GLOVE) ×1 IMPLANT
GLOVE BIOGEL PI INDICATOR 7.5 (GLOVE) ×2
GLOVE ECLIPSE 6.5 STRL STRAW (GLOVE) ×3 IMPLANT
GLOVE ORTHO TXT STRL SZ7.5 (GLOVE) ×6 IMPLANT
GOWN STRL REUS W/ TWL LRG LVL3 (GOWN DISPOSABLE) ×3 IMPLANT
GOWN STRL REUS W/ TWL XL LVL3 (GOWN DISPOSABLE) ×1 IMPLANT
GOWN STRL REUS W/TWL LRG LVL3 (GOWN DISPOSABLE) ×6
GOWN STRL REUS W/TWL XL LVL3 (GOWN DISPOSABLE) ×2
IV NS IRRIG 3000ML ARTHROMATIC (IV SOLUTION) ×12 IMPLANT
MANIFOLD NEPTUNE II (INSTRUMENTS) ×3 IMPLANT
NDL SUT 6 .5 CRC .975X.05 MAYO (NEEDLE) IMPLANT
NEEDLE MAYO TAPER (NEEDLE)
NEEDLE SCORPION MULTI FIRE (NEEDLE) ×3 IMPLANT
NS IRRIG 1000ML POUR BTL (IV SOLUTION) IMPLANT
PACK ARTHROSCOPY DSU (CUSTOM PROCEDURE TRAY) ×3 IMPLANT
PACK BASIN DAY SURGERY FS (CUSTOM PROCEDURE TRAY) ×3 IMPLANT
PASSER SUT SWANSON 36MM LOOP (INSTRUMENTS) IMPLANT
PENCIL BUTTON HOLSTER BLD 10FT (ELECTRODE) ×3 IMPLANT
SET ARTHROSCOPY TUBING (MISCELLANEOUS) ×2
SET ARTHROSCOPY TUBING LN (MISCELLANEOUS) ×1 IMPLANT
SLEEVE SCD COMPRESS KNEE MED (MISCELLANEOUS) IMPLANT
SLING ARM IMMOBILIZER LRG (SOFTGOODS) IMPLANT
SLING ARM IMMOBILIZER MED (SOFTGOODS) IMPLANT
SLING ARM LRG ADULT FOAM STRAP (SOFTGOODS) ×3 IMPLANT
SLING ARM MED ADULT FOAM STRAP (SOFTGOODS) IMPLANT
SLING ARM XL FOAM STRAP (SOFTGOODS) IMPLANT
SPONGE GAUZE 4X4 12PLY (GAUZE/BANDAGES/DRESSINGS) ×3 IMPLANT
SPONGE LAP 4X18 X RAY DECT (DISPOSABLE) IMPLANT
STRIP CLOSURE SKIN 1/2X4 (GAUZE/BANDAGES/DRESSINGS) IMPLANT
SUCTION FRAZIER TIP 10 FR DISP (SUCTIONS) IMPLANT
SUT ETHIBOND 2 OS 4 DA (SUTURE) IMPLANT
SUT ETHILON 2 0 FS 18 (SUTURE) IMPLANT
SUT ETHILON 3 0 PS 1 (SUTURE) IMPLANT
SUT FIBERWIRE #2 38 T-5 BLUE (SUTURE)
SUT LASSO 45D RIGHT (SUTURE) ×3 IMPLANT
SUT RETRIEVER MED (INSTRUMENTS) IMPLANT
SUT TIGER TAPE 7 IN WHITE (SUTURE) ×3 IMPLANT
SUT VIC AB 0 CT1 27 (SUTURE)
SUT VIC AB 0 CT1 27XBRD ANBCTR (SUTURE) IMPLANT
SUT VIC AB 2-0 SH 27 (SUTURE)
SUT VIC AB 2-0 SH 27XBRD (SUTURE) IMPLANT
SUT VIC AB 3-0 FS2 27 (SUTURE) IMPLANT
SUTURE FIBERWR #2 38 T-5 BLUE (SUTURE) IMPLANT
TAPE FIBER 2MM 7IN #2 BLUE (SUTURE) ×3 IMPLANT
TOWEL OR 17X24 6PK STRL BLUE (TOWEL DISPOSABLE) ×3 IMPLANT
WATER STERILE IRR 1000ML POUR (IV SOLUTION) ×3 IMPLANT
YANKAUER SUCT BULB TIP NO VENT (SUCTIONS) IMPLANT

## 2014-03-10 NOTE — Interval H&P Note (Signed)
History and Physical Interval Note:  03/10/2014 7:33 AM  Zachary [name]  has presented today for surgery, with the diagnosis of right shoulder impingement sydrome, degenrative arthritis A/C Joint Complete Ruputure of Rotator Cuff  Disorder Articular Cartilage  The various methods of treatment have been discussed with the patient and family. After consideration of risks, benefits and other options for treatment, the patient has consented to  Procedure(s): RIGHT SHOULDER ARTHROSCOPY WITH SUBACROMIAL DECOMPRESSION, PARTIAL ACROMIOPLASTY WITH CORACOAROMIAL RELEASE DISTAL CLAVICULECTOMY,  ROTATOR CUFF REPAIR AND EXTENSIVE DEBRIDEMENT (Right) as a surgical intervention .  The patient's history has been reviewed, patient examined, no change in status, stable for surgery.  I have reviewed the patient's chart and labs.  Questions were answered to the patient's satisfaction.     MURPHY,DANIEL F

## 2014-03-10 NOTE — Progress Notes (Signed)
Assisted Dr. Frederick with right, ultrasound guided, interscalene  block. Side rails up, monitors on throughout procedure. See vital signs in flow sheet. Tolerated Procedure well. 

## 2014-03-10 NOTE — Anesthesia Procedure Notes (Addendum)
Anesthesia Regional Block:  Interscalene brachial plexus block  Pre-Anesthetic Checklist: ,, timeout performed, Correct Patient, Correct Site, Correct Laterality, Correct Procedure, Correct Position, site marked, Risks and benefits discussed,  Surgical consent,  Pre-op evaluation,  At surgeon's request and post-op pain management  Laterality: Right  Prep: chloraprep       Needles:   Needle Type: Other     Needle Length: 9cm 9 cm Needle Gauge: 21 and 21 G    Additional Needles:  Procedures: ultrasound guided (picture in chart) Interscalene brachial plexus block Narrative:  Start time: 03/10/2014 9:11 AM End time: 03/10/2014 9:18 AM Injection made incrementally with aspirations every 5 mL.  Performed by: Personally  Anesthesiologist: Nada Libman, MD  Additional Notes: Ultrasound guidance used to: id relevant anatomy, confirm needle position, local anesthetic spread, avoidance of vascular puncture. Picture saved. No complications. Block performed personally by Jessy Oto. Albertina Parr, MD     Procedure Name: Intubation Date/Time: 03/10/2014 10:15 AM Performed by: Marrianne Mood Pre-anesthesia Checklist: Patient identified, Emergency Drugs available, Suction available, Patient being monitored and Timeout performed Patient Re-evaluated:Patient Re-evaluated prior to inductionOxygen Delivery Method: Circle System Utilized Preoxygenation: Pre-oxygenation with 100% oxygen Intubation Type: IV induction Ventilation: Mask ventilation without difficulty Laryngoscope Size: Miller and 3 Grade View: Grade II Tube type: Oral Tube size: 8.0 mm Number of attempts: 1 Airway Equipment and Method: stylet and oral airway Placement Confirmation: ETT inserted through vocal cords under direct vision,  positive ETCO2 and breath sounds checked- equal and bilateral Tube secured with: Tape Dental Injury: Teeth and Oropharynx as per pre-operative assessment

## 2014-03-10 NOTE — Anesthesia Postprocedure Evaluation (Signed)
Anesthesia Post Note  Patient: Zachary [name]  Procedure(s) Performed: Procedure(s) (LRB): RIGHT SHOULDER ARTHROSCOPY WITH SUBACROMIAL DECOMPRESSION, PARTIAL ACROMIOPLASTY WITH CORACOAROMIAL LABRUM DEBRIDEMENT RELEASE DISTAL CLAVICULECTOMY,  ROTATOR CUFF REPAIR AND EXTENSIVE DEBRIDEMENT (Right)  Anesthesia type: General  Patient location: PACU  Post pain: Pain level controlled  Post assessment: Patient's Cardiovascular Status Stable  Last Vitals:  Filed Vitals:   03/10/14 1355  BP: 110/7  Pulse: 75  Temp: 36.1 C  Resp: 16    Post vital signs: Reviewed and stable  Level of consciousness: alert  Complications: No apparent anesthesia complications

## 2014-03-10 NOTE — Discharge Instructions (Signed)
Shouder arthroscopy, rotator cuff repair, subacromial decompression Care After Instructions Refer to this sheet in the next few weeks. These discharge instructions provide you with general information on caring for yourself after you leave the hospital. Your caregiver may also give you specific instructions. Your treatment has been planned according to the most current medical practices available, but unavoidable complications sometimes occur. If you have any problems or questions after discharge, please call your caregiver. HOME INSTRUCTIONS  Take showers instead of baths until informed otherwise.  Change bandages (dressings) in 3 days.  Swab wounds daily with betadine.  Wash shoulder with soap and water.  Pat dry.  Cover wounds with bandaids. Only take over-the-counter or prescription medicines for pain, discomfort, or fever as directed by your caregiver.  Wear your sling for the next 6 weeks unless otherwise instructed. Eat a well-balanced diet.  Avoid lifting or driving until you are instructed otherwise.  Make an appointment to see your caregiver for stitches (suture) or staple removal as directed.   SEEK MEDICAL CARE IF: You have swelling of your calf or leg.  You develop shortness of breath or chest pain.  You have redness, swelling, or increasing pain in the wound.  There is pus or any unusual drainage coming from the surgical site.  You notice a bad smell coming from the surgical site or dressing.  The surgical site breaks open after sutures or staples have been removed.  There is persistent bleeding from the suture or staple line.  You are getting worse or are not improving.  You have any other questions or concerns.  SEEK IMMEDIATE MEDICAL CARE IF:  You have a fever greater than 101 You develop a rash.  You have difficulty breathing.  You develop any reaction or side effects to medicines given.  Your knee motion is decreasing rather than improving.  MAKE SURE YOU:  Understand  these instructions.  Will watch your condition.  Will get help right away if you are not doing well or get worse  .  Post Anesthesia Home Care Instructions  Activity: Get plenty of rest for the remainder of the day. A responsible adult should stay with you for 24 hours following the procedure.  For the next 24 hours, DO NOT: -Drive a car -Paediatric nurse -Drink alcoholic beverages -Take any medication unless instructed by your physician -Make any legal decisions or sign important papers.  Meals: Start with liquid foods such as gelatin or soup. Progress to regular foods as tolerated. Avoid greasy, spicy, heavy foods. If nausea and/or vomiting occur, drink only clear liquids until the nausea and/or vomiting subsides. Call your physician if vomiting continues.  Special Instructions/Symptoms: Your throat may feel dry or sore from the anesthesia or the breathing tube placed in your throat during surgery. If this causes discomfort, gargle with warm salt water. The discomfort should disappear within 24 hours.   Regional Anesthesia Blocks  1. Numbness or the inability to move the "blocked" extremity may last from 3-48 hours after placement. The length of time depends on the medication injected and your individual response to the medication. If the numbness is not going away after 48 hours, call your surgeon.  2. The extremity that is blocked will need to be protected until the numbness is gone and the  Strength has returned. Because you cannot feel it, you will need to take extra care to avoid injury. Because it may be weak, you may have difficulty moving it or using it. You may not know  what position it is in without looking at it while the block is in effect.  3. For blocks in the legs and feet, returning to weight bearing and walking needs to be done carefully. You will need to wait until the numbness is entirely gone and the strength has returned. You should be able to move your leg and  foot normally before you try and bear weight or walk. You will need someone to be with you when you first try to ensure you do not fall and possibly risk injury.  4. Bruising and tenderness at the needle site are common side effects and will resolve in a few days.  5. Persistent numbness or new problems with movement should be communicated to the surgeon or the [facility] [phone] [place] [phone]).

## 2014-03-10 NOTE — Anesthesia Preprocedure Evaluation (Signed)
Anesthesia Evaluation  Patient identified by MRN, date of birth, ID band Patient awake    Reviewed: Allergy & Precautions, H&P , NPO status , Patient's Chart, lab work & pertinent test results, reviewed documented beta blocker date and time   Airway Mallampati: II TM Distance: >3 FB Neck ROM: full    Dental   Pulmonary sleep apnea ,  breath sounds clear to auscultation        Cardiovascular hypertension, On Medications negative cardio ROS  Rhythm:regular     Neuro/Psych negative neurological ROS  negative psych ROS   GI/Hepatic Neg liver ROS, GERD-  Medicated and Controlled,  Endo/Other  Hypothyroidism   Renal/GU negative Renal ROS  negative genitourinary   Musculoskeletal   Abdominal   Peds  Hematology negative hematology ROS (+)   Anesthesia Other Findings See surgeon's H&P   Reproductive/Obstetrics negative OB ROS                           Anesthesia Physical Anesthesia Plan  ASA: II  Anesthesia Plan: General   Post-op Pain Management:    Induction: Intravenous  Airway Management Planned: Oral ETT  Additional Equipment:   Intra-op Plan:   Post-operative Plan: Extubation in OR  Informed Consent: I have reviewed the patients History and Physical, chart, labs and discussed the procedure including the risks, benefits and alternatives for the proposed anesthesia with the patient or authorized representative who has indicated his/her understanding and acceptance.   Dental Advisory Given  Plan Discussed with: CRNA and Surgeon  Anesthesia Plan Comments:         Anesthesia Quick Evaluation

## 2014-03-10 NOTE — Transfer of Care (Signed)
Immediate Anesthesia Transfer of Care Note  Patient: Zachary [name]  Procedure(s) Performed: Procedure(s): RIGHT SHOULDER ARTHROSCOPY WITH SUBACROMIAL DECOMPRESSION, PARTIAL ACROMIOPLASTY WITH CORACOAROMIAL LABRUM DEBRIDEMENT RELEASE DISTAL CLAVICULECTOMY,  ROTATOR CUFF REPAIR AND EXTENSIVE DEBRIDEMENT (Right)  Patient Location: PACU  Anesthesia Type:General  Level of Consciousness: awake, alert , oriented and patient cooperative  Airway & Oxygen Therapy: Patient Spontanous Breathing and Patient connected to face mask oxygen  Post-op Assessment: Report given to PACU RN and Post -op Vital signs reviewed and stable  Post vital signs: Reviewed and stable  Complications: No apparent anesthesia complications

## 2014-03-11 ENCOUNTER — Encounter (HOSPITAL_BASED_OUTPATIENT_CLINIC_OR_DEPARTMENT_OTHER): Payer: Self-pay | Admitting: Orthopedic Surgery

## 2014-03-11 NOTE — Op Note (Deleted)
NAME:  Zachary [name], Zachary [name] NO.:  [id number]  MEDICAL RECORD NO.:  [id number]  LOCATION:  XRAY                          FACILITY:  [facility]  PHYSICIAN:  Ninetta Lights, M.D. DATE OF BIRTH:  March 27, 1951  DATE OF PROCEDURE:  03/10/2014 DATE OF DISCHARGE:  03/05/2014                              OPERATIVE REPORT   PREOPERATIVE DIAGNOSES:  Right shoulder rotator cuff tear intertendinous portion and entire supraspinatus tendon and minimal retraction. Impingement.  Marked degenerative joint disease acromioclavicular joint with spurring.  POSTOPERATIVE DIAGNOSES:  Right shoulder rotator cuff tear intertendinous portion and entire supraspinatus tendon and minimal retraction.  Impingement.  Marked degenerative joint disease acromioclavicular joint with spurring with also posterior superior labral tear.  PROCEDURE: 1. Right shoulder exam under anesthesia, arthroscopy.  Debridement of     labrum.  Debridement of mobilization rotator cuff tear.     Bursectomy, acromioplasty, coracoacromial ligament release. 2. Excision of distal clavicle. 3. Arthroscopic-assisted rotator cuff repair with FiberWire suture x2,     with intratendinous repair anchored laterally with 2 swivel lock     anchors.  SURGEON:  Ninetta Lights, M.D.  ASSISTANT:  Eula Listen PA-C, present throughout the entire case and necessary for timely completion of procedure..  ANESTHESIA:  General.  BLOOD LOSS:  Minimal.  SPECIMENS:  None.  CULTURES:  None.  COMPLICATIONS:  None.  DRESSINGS:  Soft compressive shoulder immobilizer.  DESCRIPTION OF PROCEDURE:  The patient was brought to the operating room and placed on the operating table in supine position.  After adequate anesthesia had been obtained, placed in a beach-chair position on the shoulder positioner, prepped and draped in usual sterile fashion.  Full motion and stable shoulder.  Three portals anterior, posterior, and lateral.  Arthroscope  introduced, shoulder was then inspected. Articular cartilage intact throughout.  Complex tearing in the posterior labrum debrided.  Biceps tendon and biceps anchor intact.  From below, there was no hole in the cuff.  Cannula was redirected subacromially. Type 2-3 acromion.  Marked spurring impingement from DJD and spurring AC joint.  The cuff tear was obvious full thickness through the tendon just a capsular intact below.  This was mobilized for repair.  This was a centimeter to a centimeter and a half medial to the lateral attachment at the anterior aspect of tear going a little bit more laterally at the posterior aspect.  Reasonable tissue quality.  Bursa was resected. Acromioplasty to a type 1 acromion releasing the CA ligament.  Distal clavicle  periarticular spurs lateral centimeter of clavicle resected. Adequacy of decompression and debridement confirmed viewing from all portals.  With a cannula laterally, I placed 2 horizontal mattress sutures on the medial side of the cuff tear.  The anterior and posterior limbs were brought around the tear.  The 2 medial limbs were brought through the tendon together and then repaired.  Attention laterally and then anchored down in the tuberosity with 2 prepunched swivel lock anchors.  This gave a nice and then repaired reinforced with the sutures going through the tendon laterally into the humerus.  Adequacy of repair confirmed.  Instruments were removed.  Portals were closed with nylon. Sterile compressive dressing applied.  Sling applied.  Anesthesia reversed.  Brought to the recovery room.  Tolerated the surgery well. No complications.     Ninetta Lights, M.D.     DFM/MEDQ  D:  03/10/2014  T:  03/10/2014  Job:  [id number]

## 2014-03-11 NOTE — Op Note (Cosign Needed)
NAME:  Zachary [name], Zachary [name] NO.:  [id number]  MEDICAL RECORD NO.:  [id number]  LOCATION:  XRAY                          FACILITY:  [facility]  PHYSICIAN:  Ninetta Lights, M.D. DATE OF BIRTH:  03-14-1951  DATE OF PROCEDURE:  03/10/2014 DATE OF DISCHARGE:  03/05/2014                              OPERATIVE REPORT   PREOPERATIVE DIAGNOSES:  Right shoulder rotator cuff tear intertendinous portion and entire supraspinatus tendon and minimal retraction. Impingement.  Marked degenerative joint disease acromioclavicular joint with spurring.  POSTOPERATIVE DIAGNOSES:  Right shoulder rotator cuff tear intertendinous portion and entire supraspinatus tendon and minimal retraction.  Impingement.  Marked degenerative joint disease acromioclavicular joint with spurring with also posterior superior labral tear.  PROCEDURE: 1. Right shoulder exam under anesthesia, arthroscopy.  Debridement of     labrum.  Debridement of mobilization rotator cuff tear.     Bursectomy, acromioplasty, coracoacromial ligament release. 2. Excision of distal clavicle. 3. Arthroscopic-assisted rotator cuff repair with FiberWire suture x2,     with intratendinous repair anchored laterally with 2 swivel lock     anchors.  SURGEON:  Ninetta Lights, M.D.  ASSISTANT:  Eula Listen PA-C, present throughout the entire case and necessary for timely completion of procedure..  ANESTHESIA:  General.  BLOOD LOSS:  Minimal.  SPECIMENS:  None.  CULTURES:  None.  COMPLICATIONS:  None.  DRESSINGS:  Soft compressive shoulder immobilizer.  DESCRIPTION OF PROCEDURE:  The patient was brought to the operating room and placed on the operating table in supine position.  After adequate anesthesia had been obtained, placed in a beach-chair position on the shoulder positioner, prepped and draped in usual sterile fashion.  Full motion and stable shoulder.  Three portals anterior, posterior, and lateral.  Arthroscope  introduced, shoulder was then inspected. Articular cartilage intact throughout.  Complex tearing in the posterior labrum debrided.  Biceps tendon and biceps anchor intact.  From below, there was no hole in the cuff.  Cannula was redirected subacromially. Type 2-3 acromion.  Marked spurring impingement from DJD and spurring AC joint.  The cuff tear was obvious full thickness through the tendon just a capsular intact below.  This was mobilized for repair.  This was a centimeter to a centimeter and a half medial to the lateral attachment at the anterior aspect of tear going a little bit more laterally at the posterior aspect.  Reasonable tissue quality.  Bursa was resected. Acromioplasty to a type 1 acromion releasing the CA ligament.  Distal clavicle  periarticular spurs lateral centimeter of clavicle resected. Adequacy of decompression and debridement confirmed viewing from all portals.  With a cannula laterally, I placed 2 horizontal mattress sutures on the medial side of the cuff tear.  The anterior and posterior limbs were brought around the tear.  The 2 medial limbs were brought through the tendon together and then repaired.  Attention laterally and then anchored down in the tuberosity with 2 prepunched swivel lock anchors.  This gave a nice and then repaired reinforced with the sutures going through the tendon laterally into the humerus.  Adequacy of repair confirmed.  Instruments were removed.  Portals were closed with nylon. Sterile compressive dressing applied.  Sling applied.  Anesthesia reversed.  Brought to the recovery room.  Tolerated the surgery well. No complications.     Ninetta Lights, M.D.     DFM/MEDQ  D:  03/10/2014  T:  03/10/2014  Job:  [id number]

## 2014-03-11 NOTE — Addendum Note (Signed)
Addendum created 03/11/14 0932 by Tawni Millers, CRNA   Modules edited: Charges VN

## 2014-03-23 ENCOUNTER — Ambulatory Visit: Payer: 59 | Attending: Orthopedic Surgery | Admitting: Physical Therapy

## 2014-03-23 DIAGNOSIS — M25619 Stiffness of unspecified shoulder, not elsewhere classified: Secondary | ICD-10-CM | POA: Insufficient documentation

## 2014-03-23 DIAGNOSIS — M25519 Pain in unspecified shoulder: Secondary | ICD-10-CM | POA: Insufficient documentation

## 2014-03-23 DIAGNOSIS — R609 Edema, unspecified: Secondary | ICD-10-CM | POA: Insufficient documentation

## 2014-03-23 DIAGNOSIS — IMO0001 Reserved for inherently not codable concepts without codable children: Secondary | ICD-10-CM | POA: Insufficient documentation

## 2014-03-24 ENCOUNTER — Ambulatory Visit: Payer: 59 | Admitting: Family Medicine

## 2014-03-24 ENCOUNTER — Ambulatory Visit: Payer: 59 | Admitting: Physical Therapy

## 2014-03-29 ENCOUNTER — Ambulatory Visit: Payer: 59 | Admitting: Rehabilitation

## 2014-03-30 ENCOUNTER — Ambulatory Visit: Payer: 59 | Admitting: Rehabilitation

## 2014-04-05 ENCOUNTER — Ambulatory Visit: Payer: 59 | Admitting: Physical Therapy

## 2014-04-06 ENCOUNTER — Ambulatory Visit: Payer: 59 | Admitting: Physical Therapy

## 2014-04-07 ENCOUNTER — Ambulatory Visit: Payer: 59 | Admitting: Physical Therapy

## 2014-04-12 ENCOUNTER — Ambulatory Visit: Payer: 59 | Admitting: Rehabilitation

## 2014-04-13 ENCOUNTER — Ambulatory Visit: Payer: 59 | Admitting: Rehabilitation

## 2014-04-15 ENCOUNTER — Ambulatory Visit: Payer: 59 | Admitting: Rehabilitation

## 2014-04-18 ENCOUNTER — Ambulatory Visit: Payer: 59 | Admitting: Physical Therapy

## 2014-04-19 ENCOUNTER — Ambulatory Visit: Payer: 59 | Admitting: Rehabilitation

## 2014-04-21 ENCOUNTER — Ambulatory Visit: Payer: 59 | Admitting: Physical Therapy

## 2014-04-25 ENCOUNTER — Ambulatory Visit: Payer: 59 | Admitting: Family Medicine

## 2014-04-25 ENCOUNTER — Ambulatory Visit: Payer: 59 | Admitting: Physical Therapy

## 2014-04-27 ENCOUNTER — Ambulatory Visit: Payer: 59 | Attending: Orthopedic Surgery | Admitting: Rehabilitation

## 2014-04-27 DIAGNOSIS — M25619 Stiffness of unspecified shoulder, not elsewhere classified: Secondary | ICD-10-CM | POA: Insufficient documentation

## 2014-04-27 DIAGNOSIS — IMO0001 Reserved for inherently not codable concepts without codable children: Secondary | ICD-10-CM | POA: Insufficient documentation

## 2014-04-27 DIAGNOSIS — M25519 Pain in unspecified shoulder: Secondary | ICD-10-CM | POA: Insufficient documentation

## 2014-04-27 DIAGNOSIS — R609 Edema, unspecified: Secondary | ICD-10-CM | POA: Insufficient documentation

## 2014-04-28 ENCOUNTER — Ambulatory Visit: Payer: 59 | Admitting: Physical Therapy

## 2014-05-02 ENCOUNTER — Ambulatory Visit: Payer: 59 | Admitting: Rehabilitation

## 2014-05-03 ENCOUNTER — Ambulatory Visit: Payer: 59 | Admitting: Rehabilitation

## 2014-05-05 ENCOUNTER — Ambulatory Visit: Payer: 59 | Admitting: Rehabilitation

## 2014-05-09 ENCOUNTER — Ambulatory Visit: Payer: 59 | Admitting: Physical Therapy

## 2014-05-11 ENCOUNTER — Encounter: Payer: 59 | Admitting: Physical Therapy

## 2014-05-12 ENCOUNTER — Ambulatory Visit: Payer: 59 | Admitting: Physical Therapy

## 2014-05-17 ENCOUNTER — Ambulatory Visit: Payer: 59 | Admitting: Physical Therapy

## 2014-05-18 ENCOUNTER — Encounter: Payer: 59 | Admitting: Rehabilitation

## 2014-05-19 ENCOUNTER — Ambulatory Visit: Payer: 59 | Admitting: Rehabilitation

## 2014-05-23 ENCOUNTER — Ambulatory Visit: Payer: 59 | Attending: Orthopedic Surgery | Admitting: Physical Therapy

## 2014-05-23 DIAGNOSIS — R609 Edema, unspecified: Secondary | ICD-10-CM | POA: Diagnosis not present

## 2014-05-23 DIAGNOSIS — IMO0001 Reserved for inherently not codable concepts without codable children: Secondary | ICD-10-CM | POA: Diagnosis not present

## 2014-05-23 DIAGNOSIS — M25619 Stiffness of unspecified shoulder, not elsewhere classified: Secondary | ICD-10-CM | POA: Insufficient documentation

## 2014-05-23 DIAGNOSIS — M25519 Pain in unspecified shoulder: Secondary | ICD-10-CM | POA: Insufficient documentation

## 2014-05-25 ENCOUNTER — Encounter: Payer: 59 | Admitting: Rehabilitation

## 2014-05-26 ENCOUNTER — Ambulatory Visit: Payer: 59 | Admitting: Physical Therapy

## 2014-05-26 DIAGNOSIS — IMO0001 Reserved for inherently not codable concepts without codable children: Secondary | ICD-10-CM | POA: Diagnosis not present

## 2014-05-30 ENCOUNTER — Ambulatory Visit (INDEPENDENT_AMBULATORY_CARE_PROVIDER_SITE_OTHER): Payer: 59 | Admitting: Family Medicine

## 2014-05-30 ENCOUNTER — Encounter: Payer: Self-pay | Admitting: Family Medicine

## 2014-05-30 VITALS — BP 127/87 | HR 81 | Wt 203.0 lb

## 2014-05-30 DIAGNOSIS — E785 Hyperlipidemia, unspecified: Secondary | ICD-10-CM

## 2014-05-30 DIAGNOSIS — M7710 Lateral epicondylitis, unspecified elbow: Secondary | ICD-10-CM

## 2014-05-30 DIAGNOSIS — M791 Myalgia, unspecified site: Secondary | ICD-10-CM

## 2014-05-30 DIAGNOSIS — Z5181 Encounter for therapeutic drug level monitoring: Secondary | ICD-10-CM

## 2014-05-30 DIAGNOSIS — IMO0001 Reserved for inherently not codable concepts without codable children: Secondary | ICD-10-CM

## 2014-05-30 DIAGNOSIS — C61 Malignant neoplasm of prostate: Secondary | ICD-10-CM

## 2014-05-30 DIAGNOSIS — I10 Essential (primary) hypertension: Secondary | ICD-10-CM

## 2014-05-30 DIAGNOSIS — Z79899 Other long term (current) drug therapy: Secondary | ICD-10-CM

## 2014-05-30 NOTE — Progress Notes (Signed)
CC: Zachary [name] is a 63 y.o. male is here for Hypertension   Subjective: HPI:  Followup hyperlipidemia: Continues on Pravachol on a daily basis without right upper quadrant pain but he does report what he believes is myalgias localized to the right elbow. He has been present for the past month, worse with any movement of the elbow, nothing particularly makes it better or worse other than that. Denies muscle pain elsewhere. Denies recent or remote trauma but does repetitive motions throughout the day.  12 hypertension: Continues on lisinopril daily without cough, angioedema, for chest pain. No outside blood pressures to report. Denies shortness of breath, wheezing, or limb claudication  Followup prostate cancer: In his urologist or still using a watch and wait approach, he is a biopsy planned for later this summer. He would like to know what his PSA is today     Review Of Systems Outlined In HPI  Past Medical History  Diagnosis Date  . Hyperlipidemia   . Hypertension   . Insomnia   . Hypothyroidism   . GERD (gastroesophageal reflux disease)   . Arthritis   . Hemorrhoids   . Hearing problem     hearing deficit  . Diverticulitis   . Wears glasses   . Wears hearing aid     both ears  . Cancer 2015    positive prostate cancer bx  . Sleep apnea     uses a cpap    Past Surgical History  Procedure Laterality Date  . Hemorrhoid surgery  08/2006  . Back surgery  1978    herniated disksurgery  . Tendon repair  June 06, 2011    right elbow, Dr. Percell [name]  . Cardiac catheterization  08/01/2010  . Colonoscopy    . Shoulder arthroscopy with subacromial decompression, rotator cuff repair and bicep tendon repair Right 03/10/2014    Procedure: RIGHT SHOULDER ARTHROSCOPY WITH SUBACROMIAL DECOMPRESSION, PARTIAL ACROMIOPLASTY WITH CORACOAROMIAL LABRUM DEBRIDEMENT RELEASE DISTAL CLAVICULECTOMY,  ROTATOR CUFF REPAIR AND EXTENSIVE DEBRIDEMENT;  Surgeon: Zachary Lights, MD;  Location: [place];  Service: Orthopedics;  Laterality: Right;   Family History  Problem Relation Age of Onset  . Heart disease Father   . Breast cancer Mother   . Cancer Brother   . Alcohol abuse Other   . Arthritis Other   . Cancer Other     Breast, Prostate  . Coronary artery disease Other   . Irritable bowel syndrome Other   . Cystic fibrosis Other     History   Social History  . Marital Status: Married    Spouse Name: Zachary [name]    Number of Children: 2  . 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 Smoking status: Never Smoker   . Smokeless tobacco: Never Used  . Alcohol Use: Yes  . Drug Use: No  . Sexual Activity: Not on file   Other Topics Concern  . Not on file   Social History Narrative   Previously Chartered loss adjuster [employer]     Objective: BP 127/87  Pulse 81  Wt 203 lb (92.08 kg)   General: Alert and Oriented, No Acute Distress HEENT: Pupils equal, round, reactive to light. Conjunctivae clear.  Moist mucous membranes pharynx unremarkable Lungs: Clear to auscultation bilaterally, no wheezing/ronchi/rales.  Comfortable work of breathing. Good air movement. Cardiac: Regular rate and rhythm. Normal S1/S2.  No  murmurs, rubs, nor gallops.   Extremities: No peripheral edema.  Strong peripheral pulses. Left elbow pain is reproduced with palpation of the lateral epicondyles or resisted wrist extension Mental Status: No depression, anxiety, nor agitation. Skin: Warm and dry.  Assessment & Plan: Zachary [name] was seen today for hypertension.  Diagnoses and associated orders for this visit:  HYPERLIPIDEMIA - Lipid panel  HYPERTENSION - COMPLETE METABOLIC PANEL WITH GFR  Encounter for monitoring statin therapy - COMPLETE METABOLIC PANEL WITH GFR  Muscle ache - CK (Creatine Kinase)  Prostate cancer - PSA  Tennis elbow    Hyperlipidemia: Due for repeat  lipid panel with liver enzymes check Hypertension: Controlled continue lisinopril Muscle aches: Checking creatinine kinase to make sure that he is not developing a statin induced myopathy, if this is normal as clinically expected we'll continue treatment of tendinitis in the elbow and both shoulders. He was given a handout on home rehabilitation exercises to do at home for tennis elbow encouraged to use anti-inflammatories as needed    Return in about 3 months (around 08/30/2014) for Blood Pressure .

## 2014-06-01 ENCOUNTER — Ambulatory Visit: Payer: 59 | Admitting: Physical Therapy

## 2014-06-01 DIAGNOSIS — IMO0001 Reserved for inherently not codable concepts without codable children: Secondary | ICD-10-CM | POA: Diagnosis not present

## 2014-06-01 LAB — LIPID PANEL
Cholesterol: 155 mg/dL (ref 0–200)
HDL: 39 mg/dL — ABNORMAL LOW (ref >39)
LDL Cholesterol: 97 mg/dL (ref 0–99)
Total CHOL/HDL Ratio: 4 Ratio
Triglycerides: 95 mg/dL (ref <150)
VLDL: 19 mg/dL (ref 0–40)

## 2014-06-01 LAB — COMPLETE METABOLIC PANEL WITH GFR
ALBUMIN: 4.5 g/dL (ref 3.5–5.2)
ALT: 24 U/L (ref 0–53)
AST: 20 U/L (ref 0–37)
Alkaline Phosphatase: 52 U/L (ref 39–117)
BUN: 20 mg/dL (ref 6–23)
CALCIUM: 9.8 mg/dL (ref 8.4–10.5)
CHLORIDE: 104 meq/L (ref 96–112)
CO2: 28 mEq/L (ref 19–32)
Creat: 0.98 mg/dL (ref 0.50–1.35)
GFR, Est African American: >89 mL/min
GFR, Est Non African American: 83 mL/min
Glucose, Bld: 93 mg/dL (ref 70–99)
POTASSIUM: 4.6 meq/L (ref 3.5–5.3)
Sodium: 138 mEq/L (ref 135–145)
Total Bilirubin: 0.5 mg/dL (ref 0.2–1.2)
Total Protein: 6.6 g/dL (ref 6.0–8.3)

## 2014-06-01 LAB — CK: Total CK: 193 U/L (ref 7–232)

## 2014-06-02 ENCOUNTER — Ambulatory Visit: Payer: 59 | Admitting: Rehabilitation

## 2014-06-02 DIAGNOSIS — IMO0001 Reserved for inherently not codable concepts without codable children: Secondary | ICD-10-CM | POA: Diagnosis not present

## 2014-06-02 LAB — PSA: PSA: 8.29 ng/mL — AB (ref <=4.00)

## 2014-06-07 ENCOUNTER — Ambulatory Visit: Payer: 59 | Admitting: Physical Therapy

## 2014-06-07 DIAGNOSIS — IMO0001 Reserved for inherently not codable concepts without codable children: Secondary | ICD-10-CM | POA: Diagnosis not present

## 2014-06-08 ENCOUNTER — Ambulatory Visit: Payer: 59 | Admitting: Rehabilitation

## 2014-06-14 ENCOUNTER — Ambulatory Visit: Payer: 59 | Admitting: Rehabilitation

## 2014-06-14 DIAGNOSIS — IMO0001 Reserved for inherently not codable concepts without codable children: Secondary | ICD-10-CM | POA: Diagnosis not present

## 2014-06-16 ENCOUNTER — Ambulatory Visit: Payer: 59 | Admitting: Rehabilitation

## 2014-06-16 DIAGNOSIS — IMO0001 Reserved for inherently not codable concepts without codable children: Secondary | ICD-10-CM | POA: Diagnosis not present

## 2014-06-20 ENCOUNTER — Ambulatory Visit: Payer: 59 | Admitting: Rehabilitation

## 2014-06-23 ENCOUNTER — Ambulatory Visit: Payer: 59 | Attending: Orthopedic Surgery | Admitting: Rehabilitation

## 2014-06-23 DIAGNOSIS — M25519 Pain in unspecified shoulder: Secondary | ICD-10-CM | POA: Insufficient documentation

## 2014-06-23 DIAGNOSIS — M25619 Stiffness of unspecified shoulder, not elsewhere classified: Secondary | ICD-10-CM | POA: Diagnosis not present

## 2014-06-23 DIAGNOSIS — IMO0001 Reserved for inherently not codable concepts without codable children: Secondary | ICD-10-CM | POA: Diagnosis present

## 2014-06-23 DIAGNOSIS — R609 Edema, unspecified: Secondary | ICD-10-CM | POA: Diagnosis not present

## 2014-06-27 ENCOUNTER — Ambulatory Visit: Payer: 59 | Admitting: Rehabilitation

## 2014-06-27 DIAGNOSIS — IMO0001 Reserved for inherently not codable concepts without codable children: Secondary | ICD-10-CM | POA: Diagnosis not present

## 2014-06-30 ENCOUNTER — Ambulatory Visit: Payer: 59 | Admitting: Rehabilitation

## 2014-06-30 DIAGNOSIS — IMO0001 Reserved for inherently not codable concepts without codable children: Secondary | ICD-10-CM | POA: Diagnosis not present

## 2014-07-04 ENCOUNTER — Ambulatory Visit: Payer: 59 | Admitting: Rehabilitation

## 2014-07-04 DIAGNOSIS — IMO0001 Reserved for inherently not codable concepts without codable children: Secondary | ICD-10-CM | POA: Diagnosis not present

## 2014-07-07 ENCOUNTER — Ambulatory Visit: Payer: 59 | Admitting: Rehabilitation

## 2014-07-07 DIAGNOSIS — IMO0001 Reserved for inherently not codable concepts without codable children: Secondary | ICD-10-CM | POA: Diagnosis not present

## 2014-07-11 ENCOUNTER — Ambulatory Visit: Payer: 59 | Admitting: Rehabilitation

## 2014-07-11 DIAGNOSIS — IMO0001 Reserved for inherently not codable concepts without codable children: Secondary | ICD-10-CM | POA: Diagnosis not present

## 2014-07-14 ENCOUNTER — Ambulatory Visit: Payer: 59 | Admitting: Rehabilitation

## 2014-07-14 DIAGNOSIS — IMO0001 Reserved for inherently not codable concepts without codable children: Secondary | ICD-10-CM | POA: Diagnosis not present

## 2014-07-18 ENCOUNTER — Ambulatory Visit: Payer: 59 | Admitting: Rehabilitation

## 2014-07-18 DIAGNOSIS — IMO0001 Reserved for inherently not codable concepts without codable children: Secondary | ICD-10-CM | POA: Diagnosis not present

## 2014-07-21 ENCOUNTER — Ambulatory Visit: Payer: 59 | Admitting: Rehabilitation

## 2014-07-27 ENCOUNTER — Other Ambulatory Visit: Payer: Self-pay | Admitting: Family Medicine

## 2014-08-10 ENCOUNTER — Encounter: Payer: Self-pay | Admitting: Family Medicine

## 2014-08-17 ENCOUNTER — Other Ambulatory Visit: Payer: Self-pay | Admitting: Family Medicine

## 2014-08-18 ENCOUNTER — Encounter: Payer: Self-pay | Admitting: Urology

## 2014-08-30 ENCOUNTER — Encounter: Payer: Self-pay | Admitting: Family Medicine

## 2014-08-30 ENCOUNTER — Ambulatory Visit (INDEPENDENT_AMBULATORY_CARE_PROVIDER_SITE_OTHER): Payer: 59 | Admitting: Family Medicine

## 2014-08-30 VITALS — BP 119/77 | HR 75 | Ht 68.0 in | Wt 202.0 lb

## 2014-08-30 DIAGNOSIS — I10 Essential (primary) hypertension: Secondary | ICD-10-CM

## 2014-08-30 DIAGNOSIS — E039 Hypothyroidism, unspecified: Secondary | ICD-10-CM

## 2014-08-30 DIAGNOSIS — M549 Dorsalgia, unspecified: Secondary | ICD-10-CM

## 2014-08-30 DIAGNOSIS — M539 Dorsopathy, unspecified: Secondary | ICD-10-CM

## 2014-08-30 MED ORDER — CYCLOBENZAPRINE HCL 10 MG PO TABS: ORAL_TABLET | ORAL | Status: DC

## 2014-08-30 NOTE — Progress Notes (Signed)
CC: Zachary [name] is a 63 y.o. male is here for Hypertension   Subjective: HPI:  Followup hypertension: Continues to take lisinopril on a daily basis with no outside blood pressures to report. Denies chest pain shortness of breath orthopnea nor peripheral edema  Followup hypothyroidism: Continues to take levothyroxine 100 mcg on a daily basis. TSH back in February was normal. He denies any intolerance to the above medication. Denies unintentional weight loss or gain. Denies any skin complaints today. There has been no constipation or diarrhea  Complains of left low back pain that is described as a spasm sensation every time he gets from a seated standing position or if he rotates his torso. Symptoms have been present for the past 2 days no benefit from Aleve. Slight improvement with heating pads. Symptoms began after he had strenuous weekend painting. Pain is nonradiating described as superficial and has not been accompanied by any gastrointestinal complaints.   Review Of Systems Outlined In HPI  Past Medical History  Diagnosis Date  . Hyperlipidemia   . Hypertension   . Insomnia   . Hypothyroidism   . GERD (gastroesophageal reflux disease)   . Arthritis   . Hemorrhoids   . Hearing problem     hearing deficit  . Diverticulitis   . Wears glasses   . Wears hearing aid     both ears  . Cancer 2015    positive prostate cancer bx  . Sleep apnea     uses a cpap    Past Surgical History  Procedure Laterality Date  . Hemorrhoid surgery  08/2006  . Back surgery  1978    herniated disksurgery  . Tendon repair  June 06, 2011    right elbow, Dr. Percell Miller  . Cardiac catheterization  08/01/2010  . Colonoscopy    . Shoulder arthroscopy with subacromial decompression, rotator cuff repair and bicep tendon repair Right 03/10/2014    Procedure: RIGHT SHOULDER ARTHROSCOPY WITH SUBACROMIAL DECOMPRESSION, PARTIAL ACROMIOPLASTY WITH CORACOAROMIAL LABRUM DEBRIDEMENT RELEASE DISTAL  CLAVICULECTOMY,  ROTATOR CUFF REPAIR AND EXTENSIVE DEBRIDEMENT;  Surgeon: Ninetta Lights, MD;  Location: [facility];  Service: Orthopedics;  Laterality: Right;   Family History  Problem Relation Age of Onset  . Heart disease Father   . Breast cancer Mother   . Cancer Brother   . Alcohol abuse Other   . Arthritis Other   . Cancer Other     Breast, Prostate  . Coronary artery disease Other   . Irritable bowel syndrome Other   . Cystic fibrosis Other     History   Social History  . Marital Status: Married    Spouse Name: Neoma Laming    Number of Children: 2  . 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 Smoking status: Never Smoker   . Smokeless tobacco: Never Used  . Alcohol Use: Yes  . Drug Use: No  . Sexual Activity: Not on file   Other Topics Concern  . Not on file   Social History Narrative   Previously Chartered loss adjuster [employer]     Objective: BP 119/77  Pulse 75  Ht 5\' 8"  (1.727 m)  Wt 202 lb (91.627 kg)  BMI 30.72 kg/m2  General: Alert and Oriented, No Acute Distress HEENT: Pupils equal, round, reactive to light. Conjunctivae clear.  Moist mucous membranes pharynx unremarkable Lungs: Clear to auscultation bilaterally,  no wheezing/ronchi/rales.  Comfortable work of breathing. Good air movement. Cardiac: Regular rate and rhythm. Normal S1/S2.  No murmurs, rubs, nor gallops.   Back: No midline spinous process tenderness in the lumbar region. Pain is reproduced with palpation of paraspinal musculature just above the pelvic brim on the left. No palpable masses at the site of discomfort. Extremities: No peripheral edema.  Strong peripheral pulses.  Mental Status: No depression, anxiety, nor agitation. Skin: Warm and dry.  Assessment & Plan: Daiwik was seen today for hypertension.  Diagnoses and associated orders for this  visit:  HYPERTENSION  HYPOTHYROIDISM  Left-sided back pain, unspecified location - cyclobenzaprine (FLEXERIL) 10 MG tablet; Take a half to a full tab every 8-12 hours only as needed for muscle spasm or back pain, may cause sedation.    Hypertension: Controlled continue with lisinopril Hypothyroidism: Controlled continue levothyroxine with repeat thyroid check in February Back pain: Suspect his pain is purely from a muscular source therefore start cyclobenzaprine as needed along with gentle back stretches   Return for Thyroid and BP follow up in Feb 2015.

## 2014-09-05 ENCOUNTER — Other Ambulatory Visit: Payer: Self-pay | Admitting: Physician Assistant

## 2014-09-07 ENCOUNTER — Other Ambulatory Visit: Payer: Self-pay | Admitting: Family Medicine

## 2014-09-09 ENCOUNTER — Encounter (HOSPITAL_BASED_OUTPATIENT_CLINIC_OR_DEPARTMENT_OTHER): Payer: Self-pay | Admitting: *Deleted

## 2014-09-09 NOTE — Progress Notes (Signed)
To come by for bmet-bring cpap,meds dos

## 2014-09-12 ENCOUNTER — Encounter (HOSPITAL_BASED_OUTPATIENT_CLINIC_OR_DEPARTMENT_OTHER)
Admission: RE | Admit: 2014-09-12 | Discharge: 2014-09-12 | Disposition: A | Discharge status: Home / Self Care | Payer: 59 | Source: Ambulatory Visit | Attending: Orthopedic Surgery | Admitting: Orthopedic Surgery

## 2014-09-12 DIAGNOSIS — M7500 Adhesive capsulitis of unspecified shoulder: Secondary | ICD-10-CM | POA: Diagnosis present

## 2014-09-12 LAB — BASIC METABOLIC PANEL
Anion gap: 14 (ref 5–15)
BUN: 20 mg/dL (ref 6–23)
CO2: 24 mEq/L (ref 19–32)
CREATININE: 0.84 mg/dL (ref 0.50–1.35)
Calcium: 10.1 mg/dL (ref 8.4–10.5)
Chloride: 101 mEq/L (ref 96–112)
GFR calc Af Amer: >90 mL/min (ref >90)
GFR calc non Af Amer: >90 mL/min (ref >90)
GLUCOSE: 93 mg/dL (ref 70–99)
Potassium: 4.5 mEq/L (ref 3.7–5.3)
Sodium: 139 mEq/L (ref 137–147)

## 2014-09-14 ENCOUNTER — Other Ambulatory Visit: Payer: Self-pay | Admitting: Family Medicine

## 2014-09-14 NOTE — H&P (Signed)
[facility] [address] [place], [place] [address] [phone] A Division of [facility]  Ninetta Lights, M.D.   Robert A. Noemi Chapel, M.D.   Faythe Casa, M.D.   Johnny Bridge, M.D.   Almedia Balls, M.D Ernesta Amble. Percell Miller, M.D.  Joseph Pierini, M.D.  Lanier Prude, M.D.    Verner Chol, M.D. Mary L. Fenton Malling, PA-C  Kirstin A. Shepperson, PA-C  Josh Calhoun, PA-C [place], [place]   RE: Zachary, [name]                                [phone]      DOB: 1951-07-30 PROGRESS NOTE: 08-23-14 This is a 63 year-old male who presents to our clinic today with continued complaints of his right shoulder.  He is status post rotator cuff repair on March 10, 2014.  He has progressed since the time of surgery, but is still having issues with abduction, as well as internal rotation.  He feels as though he is getting stuck and is no longer progressing.  He is taking Advil with minimal relief of pain.  He is still unable to sleep on the affected side.   Past medical, social and family history reviewed in detail on the patient questionnaire and signed.  Review of systems: As detailed in HPI.  All others reviewed and are negative.   EXAMINATION: Well-developed, well-nourished male in no acute distress.  Alert and oriented x 3.  Examination of his right shoulder reveals forward flexion to 170 degrees, abduction to about 80 degrees with scapular elevation.  He can internally rotate to the level of L5.  He has 5/5 strength with resisted internal and external rotation.  He is neurovascularly intact distally.    IMPRESSION: Status post above surgery of the right shoulder.  Right shoulder adhesive capsulitis.   PLAN: At this point I think it is necessary that we proceed with right shoulder manipulation under anesthesia.  Today the risks, benefits and possible complications of surgery were reviewed.  Rehab and recovery time  discussed.  We will have [name] start outpatient physical therapy on post-op day one.    Ninetta Lights, M.D.   Electronically verified by Ninetta Lights, M.D. DFM(LA):jjh D 08-23-14 T 08-24-14

## 2014-09-15 ENCOUNTER — Encounter (HOSPITAL_BASED_OUTPATIENT_CLINIC_OR_DEPARTMENT_OTHER): Payer: 59 | Admitting: Anesthesiology

## 2014-09-15 ENCOUNTER — Encounter (HOSPITAL_BASED_OUTPATIENT_CLINIC_OR_DEPARTMENT_OTHER)
Admission: RE | Disposition: A | Discharge status: Home / Self Care | Payer: Self-pay | Source: Ambulatory Visit | Attending: Orthopedic Surgery

## 2014-09-15 ENCOUNTER — Ambulatory Visit (HOSPITAL_BASED_OUTPATIENT_CLINIC_OR_DEPARTMENT_OTHER): Payer: 59 | Admitting: Anesthesiology

## 2014-09-15 ENCOUNTER — Encounter (HOSPITAL_BASED_OUTPATIENT_CLINIC_OR_DEPARTMENT_OTHER): Payer: Self-pay | Admitting: *Deleted

## 2014-09-15 ENCOUNTER — Ambulatory Visit (HOSPITAL_BASED_OUTPATIENT_CLINIC_OR_DEPARTMENT_OTHER)
Admission: RE | Admit: 2014-09-15 | Discharge: 2014-09-15 | Disposition: A | Discharge status: Home / Self Care | Payer: 59 | Source: Ambulatory Visit | Attending: Orthopedic Surgery | Admitting: Orthopedic Surgery

## 2014-09-15 DIAGNOSIS — M7500 Adhesive capsulitis of unspecified shoulder: Secondary | ICD-10-CM | POA: Diagnosis not present

## 2014-09-15 HISTORY — PX: EXAM UNDER ANESTHESIA WITH MANIPULATION OF SHOULDER: SHX5817

## 2014-09-15 LAB — POCT HEMOGLOBIN-HEMACUE: HEMOGLOBIN: 16.1 g/dL (ref 13.0–17.0)

## 2014-09-15 SURGERY — EXAM UNDER ANESTHESIA, SHOULDER, WITH MANIPULATION
Anesthesia: General | Site: Shoulder | Laterality: Right

## 2014-09-15 MED ORDER — MIDAZOLAM HCL 2 MG/2ML IJ SOLN
INTRAMUSCULAR | Status: AC
  Filled 2014-09-15: qty 2

## 2014-09-15 MED ORDER — TRIAMCINOLONE ACETONIDE 40 MG/ML IJ SUSP
INTRAMUSCULAR | Status: AC
  Filled 2014-09-15: qty 5

## 2014-09-15 MED ORDER — CHLORHEXIDINE GLUCONATE 4 % EX LIQD
60.0000 mL | Freq: Once | CUTANEOUS | Status: DC
Start: 2014-09-15 — End: 2014-09-15

## 2014-09-15 MED ORDER — BETAMETHASONE SOD PHOS & ACET 6 (3-3) MG/ML IJ SUSP
INTRAMUSCULAR | Status: AC
  Filled 2014-09-15: qty 1

## 2014-09-15 MED ORDER — LACTATED RINGERS IV SOLN: INTRAVENOUS | Status: DC

## 2014-09-15 MED ORDER — BUPIVACAINE HCL (PF) 0.5 % IJ SOLN
INTRAMUSCULAR | Status: DC | PRN
  Administered 2014-09-15: 4 mL

## 2014-09-15 MED ORDER — PROPOFOL 10 MG/ML IV BOLUS
INTRAVENOUS | Status: DC | PRN
  Administered 2014-09-15: 70 mg via INTRAVENOUS

## 2014-09-15 MED ORDER — LACTATED RINGERS IV SOLN
INTRAVENOUS | Status: DC
  Administered 2014-09-15: 12:00:00 via INTRAVENOUS

## 2014-09-15 MED ORDER — METHYLPREDNISOLONE ACETATE 40 MG/ML IJ SUSP
INTRAMUSCULAR | Status: DC | PRN
  Administered 2014-09-15: 40 mg via INTRA_ARTICULAR

## 2014-09-15 MED ORDER — CEFAZOLIN SODIUM-DEXTROSE 2-3 GM-% IV SOLR
2.0000 g | INTRAVENOUS | Status: AC
  Administered 2014-09-15: 2 g via INTRAVENOUS

## 2014-09-15 MED ORDER — FENTANYL CITRATE 0.05 MG/ML IJ SOLN
INTRAMUSCULAR | Status: AC
  Filled 2014-09-15: qty 2

## 2014-09-15 MED ORDER — METHYLPREDNISOLONE ACETATE 80 MG/ML IJ SUSP
INTRAMUSCULAR | Status: AC
  Filled 2014-09-15: qty 1

## 2014-09-15 MED ORDER — FENTANYL CITRATE 0.05 MG/ML IJ SOLN
50.0000 ug | INTRAMUSCULAR | Status: DC | PRN
  Administered 2014-09-15: 100 ug via INTRAVENOUS

## 2014-09-15 MED ORDER — MIDAZOLAM HCL 2 MG/2ML IJ SOLN
1.0000 mg | INTRAMUSCULAR | Status: DC | PRN
  Administered 2014-09-15: 2 mg via INTRAVENOUS

## 2014-09-15 MED ORDER — CEFAZOLIN SODIUM-DEXTROSE 2-3 GM-% IV SOLR
INTRAVENOUS | Status: AC
  Filled 2014-09-15: qty 50

## 2014-09-15 MED ORDER — BUPIVACAINE HCL (PF) 0.5 % IJ SOLN
INTRAMUSCULAR | Status: AC
  Filled 2014-09-15: qty 30

## 2014-09-15 MED ORDER — METHYLPREDNISOLONE ACETATE 40 MG/ML IJ SUSP
INTRAMUSCULAR | Status: AC
  Filled 2014-09-15: qty 1

## 2014-09-15 SURGICAL SUPPLY — 18 items
BANDAGE ADH SHEER 1  50/CT (GAUZE/BANDAGES/DRESSINGS) ×3 IMPLANT
GLOVE BIOGEL PI IND STRL 7.0 (GLOVE) ×1 IMPLANT
GLOVE BIOGEL PI IND STRL 8 (GLOVE) ×1 IMPLANT
GLOVE BIOGEL PI INDICATOR 7.0 (GLOVE) ×2
GLOVE BIOGEL PI INDICATOR 8 (GLOVE) ×2
GLOVE ECLIPSE 6.5 STRL STRAW (GLOVE) ×3 IMPLANT
GLOVE ORTHO TXT STRL SZ7.5 (GLOVE) ×3 IMPLANT
GOWN STRL REUS W/ TWL LRG LVL3 (GOWN DISPOSABLE) ×2 IMPLANT
GOWN STRL REUS W/ TWL XL LVL3 (GOWN DISPOSABLE) ×1 IMPLANT
GOWN STRL REUS W/TWL LRG LVL3 (GOWN DISPOSABLE) ×4
GOWN STRL REUS W/TWL XL LVL3 (GOWN DISPOSABLE) ×2
NDL SAFETY ECLIPSE 18X1.5 (NEEDLE) ×1 IMPLANT
NEEDLE HYPO 18GX1.5 SHARP (NEEDLE) ×2
NEEDLE HYPO 22GX1.5 SAFETY (NEEDLE) ×3 IMPLANT
NEEDLE SPNL 22GX3.5 QUINCKE BK (NEEDLE) IMPLANT
PAD ALCOHOL SWAB (MISCELLANEOUS) ×6 IMPLANT
SWABSTICK POVIDONE IODINE SNGL (MISCELLANEOUS) IMPLANT
SYR 20CC LL (SYRINGE) ×3 IMPLANT

## 2014-09-15 NOTE — Discharge Instructions (Signed)
Shoulder Manipulations  Wear sling until block has worn off.  Start physical therapy tomorrow!  May return to work on Monday but do not do any lifting with right arm.  Follow up appointment in one week.   Post Anesthesia Home Care Instructions  Activity: Get plenty of rest for the remainder of the day. A responsible adult should stay with you for 24 hours following the procedure.  For the next 24 hours, DO NOT: -Drive a car -Paediatric nurse -Drink alcoholic beverages -Take any medication unless instructed by your physician -Make any legal decisions or sign important papers.  Meals: Start with liquid foods such as gelatin or soup. Progress to regular foods as tolerated. Avoid greasy, spicy, heavy foods. If nausea and/or vomiting occur, drink only clear liquids until the nausea and/or vomiting subsides. Call your physician if vomiting continues.  Special Instructions/Symptoms: Your throat may feel dry or sore from the anesthesia or the breathing tube placed in your throat during surgery. If this causes discomfort, gargle with warm salt water. The discomfort should disappear within 24 hours.   Regional Anesthesia Blocks  1. Numbness or the inability to move the "blocked" extremity may last from 3-48 hours after placement. The length of time depends on the medication injected and your individual response to the medication. If the numbness is not going away after 48 hours, call your surgeon.  2. The extremity that is blocked will need to be protected until the numbness is gone and the  Strength has returned. Because you cannot feel it, you will need to take extra care to avoid injury. Because it may be weak, you may have difficulty moving it or using it. You may not know what position it is in without looking at it while the block is in effect.  3. For blocks in the legs and feet, returning to weight bearing and walking needs to be done carefully. You will need to wait until the numbness is  entirely gone and the strength has returned. You should be able to move your leg and foot normally before you try and bear weight or walk. You will need someone to be with you when you first try to ensure you do not fall and possibly risk injury.  4. Bruising and tenderness at the needle site are common side effects and will resolve in a few days.  5. Persistent numbness or new problems with movement should be communicated to the surgeon or the [facility] [phone] [place] [phone]).

## 2014-09-15 NOTE — Transfer of Care (Signed)
Immediate Anesthesia Transfer of Care Note  Patient: Zachary [name]  Procedure(s) Performed: Procedure(s): RIGHT SHOULDER MANIPULATION UNDER ANESTHESIA (Right)  Patient Location: PACU  Anesthesia Type:General  Level of Consciousness: awake and alert   Airway & Oxygen Therapy: Patient Spontanous Breathing and Patient connected to face mask oxygen  Post-op Assessment: Report given to PACU RN and Post -op Vital signs reviewed and stable  Post vital signs: Reviewed and stable  Complications: No apparent anesthesia complications

## 2014-09-15 NOTE — Progress Notes (Signed)
Assisted Dr. Crews with right, ultrasound guided, interscalene  block. Side rails up, monitors on throughout procedure. See vital signs in flow sheet. Tolerated Procedure well. 

## 2014-09-15 NOTE — Anesthesia Postprocedure Evaluation (Signed)
  Anesthesia Post-op Note  Patient: Zachary [name]  Procedure(s) Performed: Procedure(s): RIGHT SHOULDER MANIPULATION UNDER ANESTHESIA (Right)  Patient Location: PACU  Anesthesia Type: General, regional block for pain post op   Level of Consciousness: awake, alert  and oriented  Airway and Oxygen Therapy: Patient Spontanous Breathing  Post-op Pain: none  Post-op Assessment: Post-op Vital signs reviewed  Post-op Vital Signs: Reviewed  Last Vitals:  Filed Vitals:   09/15/14 1215  BP: 115/65  Pulse: 79  Temp: 36.9 C  Resp: 20    Complications: No apparent anesthesia complications

## 2014-09-15 NOTE — Anesthesia Preprocedure Evaluation (Signed)
Anesthesia Evaluation  Patient identified by MRN, date of birth, ID band Patient awake    Reviewed: Allergy & Precautions, H&P , NPO status , Patient's Chart, lab work & pertinent test results  Airway Mallampati: I TM Distance: >3 FB Neck ROM: Full    Dental  (+) Teeth Intact, Dental Advisory Given   Pulmonary  breath sounds clear to auscultation        Cardiovascular hypertension, Pt. on medications Rhythm:Regular Rate:Normal     Neuro/Psych    GI/Hepatic GERD-  Medicated and Controlled,  Endo/Other    Renal/GU      Musculoskeletal   Abdominal   Peds  Hematology   Anesthesia Other Findings   Reproductive/Obstetrics                           Anesthesia Physical Anesthesia Plan  ASA: II  Anesthesia Plan: General   Post-op Pain Management:    Induction: Intravenous  Airway Management Planned: Mask  Additional Equipment:   Intra-op Plan:   Post-operative Plan:   Informed Consent: I have reviewed the patients History and Physical, chart, labs and discussed the procedure including the risks, benefits and alternatives for the proposed anesthesia with the patient or authorized representative who has indicated his/her understanding and acceptance.   Dental advisory given  Plan Discussed with: CRNA, Anesthesiologist and Surgeon  Anesthesia Plan Comments:         Anesthesia Quick Evaluation

## 2014-09-15 NOTE — Interval H&P Note (Signed)
History and Physical Interval Note:  09/15/2014 7:37 AM  Zachary [name]  has presented today for surgery, with the diagnosis of right shoulder adhesive capsulitis - frozen shoulder  The various methods of treatment have been discussed with the patient and family. After consideration of risks, benefits and other options for treatment, the patient has consented to  Procedure(s): RIGHT SHOULDER MANIPULATION UNDER ANESTHESIA (Right) as a surgical intervention .  The patient's history has been reviewed, patient examined, no change in status, stable for surgery.  I have reviewed the patient's chart and labs.  Questions were answered to the patient's satisfaction.     Anquan Azzarello F

## 2014-09-16 ENCOUNTER — Encounter (HOSPITAL_BASED_OUTPATIENT_CLINIC_OR_DEPARTMENT_OTHER): Payer: Self-pay | Admitting: Orthopedic Surgery

## 2014-09-16 ENCOUNTER — Ambulatory Visit: Payer: 59 | Attending: Orthopedic Surgery | Admitting: Physical Therapy

## 2014-09-16 DIAGNOSIS — M25619 Stiffness of unspecified shoulder, not elsewhere classified: Secondary | ICD-10-CM | POA: Insufficient documentation

## 2014-09-16 DIAGNOSIS — M25519 Pain in unspecified shoulder: Secondary | ICD-10-CM | POA: Insufficient documentation

## 2014-09-16 DIAGNOSIS — IMO0001 Reserved for inherently not codable concepts without codable children: Secondary | ICD-10-CM | POA: Diagnosis present

## 2014-09-16 NOTE — Op Note (Signed)
NAME:  PRANIT, OWENSBY NO.:  [id number]  MEDICAL RECORD NO.:  [id number]  LOCATION:                                 FACILITY:  PHYSICIAN:  Ninetta Lights, M.D. DATE OF BIRTH:  10-Jun-1951  DATE OF PROCEDURE:  09/15/2014 DATE OF DISCHARGE:  09/15/2014                              OPERATIVE REPORT   PREOPERATIVE DIAGNOSIS:  Right shoulder rotator cuff repair.  Residual adhesive capsulitis.  POSTOPERATIVE DIAGNOSIS:  Right shoulder rotator cuff repair.  Residual adhesive capsulitis.  PROCEDURE:  Right shoulder exam under anesthesia with manipulation. Subacromial injection, Depo-Medrol and Marcaine.  SURGEON:  Ninetta Lights, M.D.  ANESTHESIA:  General.  OPERATIVE PROCEDURE:  Patient was brought to the operating room.  After adequate anesthesia had been obtained, right shoulder was examined.  A 20% to 25% loss of motion with __________ abduction and internal rotation.  Manipulated and breaking up all adhesions.  Achieved full motion stable shoulder without difficulty.  Under sterile technique, injected subacromially Depo-Medrol and Marcaine.  Band-aid applied. Anesthesia reversed.  Brought to the recovery room.  Tolerated the surgery well.  No complications.     Ninetta Lights, M.D.     DFM/MEDQ  D:  09/15/2014  T:  09/16/2014  Job:  [id number]

## 2014-09-19 ENCOUNTER — Ambulatory Visit: Payer: 59 | Admitting: Rehabilitation

## 2014-09-19 DIAGNOSIS — IMO0001 Reserved for inherently not codable concepts without codable children: Secondary | ICD-10-CM | POA: Diagnosis not present

## 2014-09-20 ENCOUNTER — Ambulatory Visit: Payer: 59 | Admitting: Physical Therapy

## 2014-09-20 DIAGNOSIS — IMO0001 Reserved for inherently not codable concepts without codable children: Secondary | ICD-10-CM | POA: Diagnosis not present

## 2014-09-21 ENCOUNTER — Ambulatory Visit: Payer: 59 | Admitting: Rehabilitation

## 2014-09-21 DIAGNOSIS — IMO0001 Reserved for inherently not codable concepts without codable children: Secondary | ICD-10-CM | POA: Diagnosis not present

## 2014-09-22 ENCOUNTER — Ambulatory Visit: Payer: 59 | Attending: Orthopedic Surgery | Admitting: Physical Therapy

## 2014-09-22 DIAGNOSIS — Z9889 Other specified postprocedural states: Secondary | ICD-10-CM | POA: Diagnosis not present

## 2014-09-22 DIAGNOSIS — M25611 Stiffness of right shoulder, not elsewhere classified: Secondary | ICD-10-CM | POA: Diagnosis not present

## 2014-09-22 DIAGNOSIS — I10 Essential (primary) hypertension: Secondary | ICD-10-CM | POA: Insufficient documentation

## 2014-09-22 DIAGNOSIS — M25511 Pain in right shoulder: Secondary | ICD-10-CM | POA: Diagnosis not present

## 2014-09-22 DIAGNOSIS — C61 Malignant neoplasm of prostate: Secondary | ICD-10-CM | POA: Diagnosis not present

## 2014-09-23 ENCOUNTER — Ambulatory Visit: Payer: 59 | Admitting: Rehabilitation

## 2014-09-23 DIAGNOSIS — M25511 Pain in right shoulder: Secondary | ICD-10-CM | POA: Diagnosis not present

## 2014-09-28 ENCOUNTER — Other Ambulatory Visit: Payer: Self-pay | Admitting: Family Medicine

## 2014-10-08 ENCOUNTER — Encounter: Payer: Self-pay | Admitting: Emergency Medicine

## 2014-10-08 ENCOUNTER — Emergency Department
Admission: EM | Admit: 2014-10-08 | Discharge: 2014-10-08 | Disposition: A | Discharge status: Home / Self Care | Payer: 59 | Source: Home / Self Care | Attending: Emergency Medicine | Admitting: Emergency Medicine

## 2014-10-08 DIAGNOSIS — J0120 Acute ethmoidal sinusitis, unspecified: Secondary | ICD-10-CM

## 2014-10-08 MED ORDER — AMOXICILLIN-POT CLAVULANATE 875-125 MG PO TABS: 1.0000 | ORAL_TABLET | Freq: Two times a day (BID) | ORAL | Status: DC

## 2014-10-08 MED ORDER — HYDROCOD POLST-CHLORPHEN POLST 10-8 MG/5ML PO LQCR: 5.0000 mL | Freq: Two times a day (BID) | ORAL | Status: DC

## 2014-10-08 NOTE — ED Notes (Signed)
C/o cold and flu like symptoms x 5 days.  Last night worsening cough.

## 2014-10-08 NOTE — ED Provider Notes (Signed)
CSN: [id number]     Arrival date & time 10/08/14  9449 History   First MD Initiated Contact with Patient 10/08/14 [phone]     Chief Complaint  Patient presents with  . Nasal Congestion  . Cough   (Consider location/radiation/quality/duration/timing/severity/associated sxs/prior Treatment) Patient is a 63 y.o. male presenting with cough. The history is provided by the patient. No language interpreter was used.  Cough Cough characteristics:  Non-productive Severity:  Moderate Onset quality:  Gradual Duration:  5 days Timing:  Constant Progression:  Worsening Chronicity:  New Smoker: no   Context: with activity   Relieved by:  Nothing Worsened by:  Nothing tried Ineffective treatments:  None tried Associated symptoms: fever, headaches and sore throat   Risk factors: recent travel   Risk factors: no recent infection     Past Medical History  Diagnosis Date  . Hyperlipidemia   . Hypertension   . Insomnia   . Hypothyroidism   . GERD (gastroesophageal reflux disease)   . Arthritis   . Hemorrhoids   . Hearing problem     hearing deficit  . Diverticulitis   . Wears glasses   . Wears hearing aid     both ears  . Cancer 2015    positive prostate cancer bx  . Sleep apnea     uses a cpap   Past Surgical History  Procedure Laterality Date  . Hemorrhoid surgery  08/2006  . Back surgery  1978    herniated disksurgery  . Tendon repair  June 06, 2011    right elbow, Dr. Percell Miller  . Cardiac catheterization  08/01/2010  . Colonoscopy    . Shoulder arthroscopy with subacromial decompression, rotator cuff repair and bicep tendon repair Right 03/10/2014    Procedure: RIGHT SHOULDER ARTHROSCOPY WITH SUBACROMIAL DECOMPRESSION, PARTIAL ACROMIOPLASTY WITH CORACOAROMIAL LABRUM DEBRIDEMENT RELEASE DISTAL CLAVICULECTOMY,  ROTATOR CUFF REPAIR AND EXTENSIVE DEBRIDEMENT;  Surgeon: Ninetta Lights, MD;  Location: [facility];  Service: Orthopedics;  Laterality: Right;  . Exam under  anesthesia with manipulation of shoulder Right 09/15/2014    Procedure: RIGHT SHOULDER MANIPULATION UNDER ANESTHESIA;  Surgeon: Ninetta Lights, MD;  Location: [facility];  Service: Orthopedics;  Laterality: Right;   Family History  Problem Relation Age of Onset  . Heart disease Father   . Breast cancer Mother   . Cancer Brother   . Alcohol abuse Other   . Arthritis Other   . Cancer Other     Breast, Prostate  . Coronary artery disease Other   . Irritable bowel syndrome Other   . Cystic fibrosis Other    History  Substance Use Topics  . Smoking status: Never Smoker   . Smokeless tobacco: Never Used  . Alcohol Use: Yes    Review of Systems  Constitutional: Positive for fever.  HENT: Positive for sore throat.   Respiratory: Positive for cough.   Neurological: Positive for headaches.  All other systems reviewed and are negative.   Allergies  Oxycodone and Simvastatin  Home Medications   Prior to Admission medications   Medication Sig Start Date End Date Taking? Authorizing Provider  amoxicillin-clavulanate (AUGMENTIN) 875-125 MG per tablet Take 1 tablet by mouth every 12 (twelve) hours. 10/08/14   Fransico Meadow, PA-C  aspirin EC 81 MG EC tablet Take 81 mg by mouth daily.      Historical Provider, MD  bisacodyl (DULCOLAX) 5 MG EC tablet Take 1 tablet (5 mg total) by mouth daily as needed  for moderate constipation. 03/10/14   M. Doran Stabler, PA-C  chlorpheniramine-HYDROcodone (TUSSIONEX PENNKINETIC ER) 10-8 MG/5ML LQCR Take 5 mLs by mouth 2 (two) times daily. 10/08/14   Fransico Meadow, PA-C  cyclobenzaprine (FLEXERIL) 10 MG tablet Take a half to a full tab every 8-12 hours only as needed for muscle spasm or back pain, may cause sedation. 08/30/14   Marcial Pacas, DO  levothyroxine (SYNTHROID, LEVOTHROID) 100 MCG tablet TAKE 1 TABLET BY MOUTH ONCE DAILY 09/28/14   Sean Hommel, DO  lisinopril (PRINIVIL,ZESTRIL) 5 MG tablet TAKE 1 TABLET (5 MG TOTAL) BY MOUTH DAILY.  09/14/14   Sean Hommel, DO  loratadine (CLARITIN) 10 MG tablet Take 10 mg by mouth daily.      Historical Provider, MD  omeprazole-sodium bicarbonate (ZEGERID) 40-1100 MG per capsule TAKE 1 CAPSULE BY MOUTH DAILY BEFORE BREAKFAST. 07/27/14   Sean Hommel, DO  pravastatin (PRAVACHOL) 40 MG tablet TAKE 1/2 TABLET BY MOUTH DAILY. 09/07/14   Sean Hommel, DO   BP 129/82  Pulse 70  Temp(Src) 98.1 F (36.7 C) (Oral)  Resp 18  Ht 5\' 8"  (1.727 m)  Wt 204 lb (92.534 kg)  BMI 31.03 kg/m2  SpO2 100% Physical Exam  Nursing note and vitals reviewed. Constitutional: He is oriented to person, place, and time. He appears well-developed and well-nourished.  HENT:  Head: Normocephalic.  Right Ear: External ear normal.  Left Ear: External ear normal.  Nose: Nose normal.  Mouth/Throat: Oropharynx is clear and moist.  Tender maxillary sinuses   Eyes: Conjunctivae and EOM are normal. Pupils are equal, round, and reactive to light.  Neck: Normal range of motion.  Cardiovascular: Normal rate and normal heart sounds.   Pulmonary/Chest: Effort normal.  Abdominal: He exhibits no distension.  Musculoskeletal: Normal range of motion.  Neurological: He is alert and oriented to person, place, and time.  Skin: Skin is warm.  Psychiatric: He has a normal mood and affect.    ED Course  Procedures (including critical care time) Labs Review Labs Reviewed - No data to display  Imaging Review No results found.   MDM   1. Acute ethmoidal sinusitis, recurrence not specified    augmentin tussionex AVS    Fransico Meadow, PA-C 10/08/14 [phone]

## 2014-10-08 NOTE — Discharge Instructions (Signed)

## 2014-10-10 ENCOUNTER — Telehealth: Payer: Self-pay | Admitting: Family Medicine

## 2014-10-10 DIAGNOSIS — B9689 Other specified bacterial agents as the cause of diseases classified elsewhere: Secondary | ICD-10-CM

## 2014-10-10 DIAGNOSIS — J329 Chronic sinusitis, unspecified: Principal | ICD-10-CM

## 2014-10-10 MED ORDER — DOXYCYCLINE HYCLATE 100 MG PO TABS: ORAL_TABLET | ORAL | Status: AC

## 2014-10-10 NOTE — ED Provider Notes (Signed)
Medical history/examination/treatment/procedure(s) were performed by non-physician provider and as supervising physician I was immediately available for consultation/collaboration.   Jacqulyn Cane, MD 10/10/14 1016

## 2014-10-10 NOTE — Telephone Encounter (Signed)
augmentin causing diarrhea

## 2014-10-14 ENCOUNTER — Emergency Department
Admission: EM | Admit: 2014-10-14 | Discharge: 2014-10-14 | Disposition: A | Discharge status: Home / Self Care | Payer: 59 | Source: Home / Self Care | Attending: Emergency Medicine | Admitting: Emergency Medicine

## 2014-10-14 ENCOUNTER — Encounter: Payer: Self-pay | Admitting: Emergency Medicine

## 2014-10-14 DIAGNOSIS — R1032 Left lower quadrant pain: Secondary | ICD-10-CM

## 2014-10-14 MED ORDER — METRONIDAZOLE 500 MG PO TABS: 500.0000 mg | ORAL_TABLET | Freq: Three times a day (TID) | ORAL | Status: DC

## 2014-10-14 MED ORDER — CIPROFLOXACIN HCL 500 MG PO TABS: 500.0000 mg | ORAL_TABLET | Freq: Two times a day (BID) | ORAL | Status: DC

## 2014-10-14 NOTE — ED Notes (Signed)
Lower left abdominal pain radiates to lower back since yesterday

## 2014-10-14 NOTE — ED Provider Notes (Signed)
CSN: [id number]     Arrival date & time 10/14/14  1308 History   First MD Initiated Contact with Patient 10/14/14 1325     Chief Complaint  Patient presents with  . Abdominal Pain   (Consider location/radiation/quality/duration/timing/severity/associated sxs/prior Treatment) HPI He has a history of one day of left lower quadrant pain, 7/10.  He has a history of diverticulitis and ate some nuts a few days ago.  He relates left lower quadrant pain, constant.  No fever, chills, nausea, vomiting.  Bowel movements are fairly normal.  He was diagnosed with a respiratory infection about 2 weeks ago, placed on Augmentin which gave him some diarrhea and his PCP then switched him to stop doxycycline.  The diarrhea has slowed significantly since he switched.  Otherwise is feeling fine.  No dysuria, hematuria, frequency.  Past Medical History  Diagnosis Date  . Hyperlipidemia   . Hypertension   . Insomnia   . Hypothyroidism   . GERD (gastroesophageal reflux disease)   . Arthritis   . Hemorrhoids   . Hearing problem     hearing deficit  . Diverticulitis   . Wears glasses   . Wears hearing aid     both ears  . Cancer 2015    positive prostate cancer bx  . Sleep apnea     uses a cpap   Past Surgical History  Procedure Laterality Date  . Hemorrhoid surgery  08/2006  . Back surgery  1978    herniated disksurgery  . Tendon repair  June 06, 2011    right elbow, Dr. Percell Miller  . Cardiac catheterization  08/01/2010  . Colonoscopy    . Shoulder arthroscopy with subacromial decompression, rotator cuff repair and bicep tendon repair Right 03/10/2014    Procedure: RIGHT SHOULDER ARTHROSCOPY WITH SUBACROMIAL DECOMPRESSION, PARTIAL ACROMIOPLASTY WITH CORACOAROMIAL LABRUM DEBRIDEMENT RELEASE DISTAL CLAVICULECTOMY,  ROTATOR CUFF REPAIR AND EXTENSIVE DEBRIDEMENT;  Surgeon: Ninetta Lights, MD;  Location: [place];  Service: Orthopedics;  Laterality: Right;  . Exam under anesthesia with  manipulation of shoulder Right 09/15/2014    Procedure: RIGHT SHOULDER MANIPULATION UNDER ANESTHESIA;  Surgeon: Ninetta Lights, MD;  Location: [place];  Service: Orthopedics;  Laterality: Right;   Family History  Problem Relation Age of Onset  . Heart disease Father   . Breast cancer Mother   . Cancer Brother   . Alcohol abuse Other   . Arthritis Other   . Cancer Other     Breast, Prostate  . Coronary artery disease Other   . Irritable bowel syndrome Other   . Cystic fibrosis Other    History  Substance Use Topics  . Smoking status: Never Smoker   . Smokeless tobacco: Never Used  . Alcohol Use: Yes    Review of Systems  All other systems reviewed and are negative.   Allergies  Oxycodone and Simvastatin  Home Medications   Prior to Admission medications   Medication Sig Start Date End Date Taking? Authorizing Provider  amoxicillin-clavulanate (AUGMENTIN) 875-125 MG per tablet Take 1 tablet by mouth every 12 (twelve) hours. 10/08/14   Fransico Meadow, PA-C  aspirin EC 81 MG EC tablet Take 81 mg by mouth daily.      Historical Provider, MD  bisacodyl (DULCOLAX) 5 MG EC tablet Take 1 tablet (5 mg total) by mouth daily as needed for moderate constipation. 03/10/14   M. Doran Stabler, PA-C  chlorpheniramine-HYDROcodone (TUSSIONEX PENNKINETIC ER) 10-8 MG/5ML LQCR Take 5 mLs by  mouth 2 (two) times daily. 10/08/14   Fransico Meadow, PA-C  ciprofloxacin (CIPRO) 500 MG tablet Take 1 tablet (500 mg total) by mouth 2 (two) times daily. 10/14/14   Janeann Forehand, MD  cyclobenzaprine (FLEXERIL) 10 MG tablet Take a half to a full tab every 8-12 hours only as needed for muscle spasm or back pain, may cause sedation. 08/30/14   Marcial Pacas, DO  doxycycline (VIBRA-TABS) 100 MG tablet One by mouth twice a day for ten days. 10/10/14 10/20/14  Marcial Pacas, DO  levothyroxine (SYNTHROID, LEVOTHROID) 100 MCG tablet TAKE 1 TABLET BY MOUTH ONCE DAILY 09/28/14   Sean Hommel, DO   lisinopril (PRINIVIL,ZESTRIL) 5 MG tablet TAKE 1 TABLET (5 MG TOTAL) BY MOUTH DAILY. 09/14/14   Sean Hommel, DO  loratadine (CLARITIN) 10 MG tablet Take 10 mg by mouth daily.      Historical Provider, MD  metroNIDAZOLE (FLAGYL) 500 MG tablet Take 1 tablet (500 mg total) by mouth 3 (three) times daily. 10/14/14   Janeann Forehand, MD  omeprazole-sodium bicarbonate (ZEGERID) 40-1100 MG per capsule TAKE 1 CAPSULE BY MOUTH DAILY BEFORE BREAKFAST. 07/27/14   Sean Hommel, DO  pravastatin (PRAVACHOL) 40 MG tablet TAKE 1/2 TABLET BY MOUTH DAILY. 09/07/14   Sean Hommel, DO   BP 127/84  Pulse 78  Temp(Src) 97.9 F (36.6 C) (Oral)  Ht 5\' 8"  (1.727 m)  Wt 201 lb (91.173 kg)  BMI 30.57 kg/m2  SpO2 95% Physical Exam  Nursing note and vitals reviewed. Constitutional: He is oriented to person, place, and time. He appears well-developed and well-nourished.  HENT:  Head: Normocephalic and atraumatic.  Eyes: No scleral icterus.  Neck: Neck supple.  Cardiovascular: Regular rhythm and normal heart sounds.   Pulmonary/Chest: Effort normal and breath sounds normal. No respiratory distress.  Abdominal: Soft. Normal appearance. There is tenderness in the left lower quadrant. There is no rigidity, no rebound, no guarding, no CVA tenderness, no tenderness at McBurney's point and negative Murphy's sign. No hernia.  Neurological: He is alert and oriented to person, place, and time.  Skin: Skin is warm and dry.  Psychiatric: He has a normal mood and affect. His speech is normal.    ED Course  Procedures (including critical care time) Labs Review Labs Reviewed - No data to display  Imaging Review No results found.   MDM   1. Abdominal pain, left lower quadrant    He has symptoms consistent with uncomplicated diverticulitis.  This is consistent with the history as well.  Differential diagnosis includes C. Difficile.  He will stop any antibiotics and is currently on and start Cipro twice a day plus Flagyl  3 times a day.  Avoid irritating foods such as corn and nuts.  Liquid diet if necessary.  Over-the-counter anti-inflammatories if necessary.  Followup with Dr. Ileene Rubens as directed.  If worsening over the weekend, ER precautions were given.  Likely would need a CT of his abdomen.  As of today, I do not believe that he requires any further blood work or lab work.  Janeann Forehand, MD 10/14/14 1332

## 2014-10-17 ENCOUNTER — Telehealth: Payer: Self-pay | Admitting: *Deleted

## 2014-11-02 ENCOUNTER — Telehealth: Payer: Self-pay | Admitting: Family Medicine

## 2014-11-02 MED ORDER — SODIUM BICARBONATE 650 MG PO TABS: 650.0000 mg | ORAL_TABLET | Freq: Once | ORAL | Status: DC

## 2014-11-02 MED ORDER — OMEPRAZOLE 40 MG PO CPDR: DELAYED_RELEASE_CAPSULE | ORAL | Status: DC

## 2014-11-02 NOTE — Telephone Encounter (Signed)
Zegerid no longer available

## 2015-01-30 ENCOUNTER — Encounter: Payer: Self-pay | Admitting: Family Medicine

## 2015-01-30 ENCOUNTER — Ambulatory Visit (INDEPENDENT_AMBULATORY_CARE_PROVIDER_SITE_OTHER): Payer: 59 | Admitting: Family Medicine

## 2015-01-30 VITALS — BP 116/70 | HR 81 | Wt 204.0 lb

## 2015-01-30 DIAGNOSIS — E039 Hypothyroidism, unspecified: Secondary | ICD-10-CM

## 2015-01-30 DIAGNOSIS — I10 Essential (primary) hypertension: Secondary | ICD-10-CM

## 2015-01-30 NOTE — Progress Notes (Signed)
CC: Zachary [name] is a 64 y.o. male is here for f/u BP and f/u Thyroid   Subjective: HPI:  Follow up essential hypertension: Continues to take lisinopril daily basis. Denies cough shortness of breath wheezing nor chest pain. No motor or sensory disturbances no known side effects from the above medication  Hypothyroidism follow-up: Continues to take 100 g of levothyroxine on a daily basis.denies unintentional weight loss or gain. There's been no skin changes or hair changes. No gastrointestinal complaints today   Review Of Systems Outlined In HPI  Past Medical History  Diagnosis Date  . Hyperlipidemia   . Hypertension   . Insomnia   . Hypothyroidism   . GERD (gastroesophageal reflux disease)   . Arthritis   . Hemorrhoids   . Hearing problem     hearing deficit  . Diverticulitis   . Wears glasses   . Wears hearing aid     both ears  . Cancer 2015    positive prostate cancer bx  . Sleep apnea     uses a cpap    Past Surgical History  Procedure Laterality Date  . Hemorrhoid surgery  08/2006  . Back surgery  1978    herniated disksurgery  . Tendon repair  June 06, 2011    right elbow, Dr. Percell Miller  . Cardiac catheterization  08/01/2010  . Colonoscopy    . Shoulder arthroscopy with subacromial decompression, rotator cuff repair and bicep tendon repair Right 03/10/2014    Procedure: RIGHT SHOULDER ARTHROSCOPY WITH SUBACROMIAL DECOMPRESSION, PARTIAL ACROMIOPLASTY WITH CORACOAROMIAL LABRUM DEBRIDEMENT RELEASE DISTAL CLAVICULECTOMY,  ROTATOR CUFF REPAIR AND EXTENSIVE DEBRIDEMENT;  Surgeon: Ninetta Lights, MD;  Location: [place];  Service: Orthopedics;  Laterality: Right;  . Exam under anesthesia with manipulation of shoulder Right 09/15/2014    Procedure: RIGHT SHOULDER MANIPULATION UNDER ANESTHESIA;  Surgeon: Ninetta Lights, MD;  Location: [place];  Service: Orthopedics;  Laterality: Right;   Family History  Problem Relation Age of Onset   . Heart disease Father   . Breast cancer Mother   . Cancer Brother   . Alcohol abuse Other   . Arthritis Other   . Cancer Other     Breast, Prostate  . Coronary artery disease Other   . Irritable bowel syndrome Other   . Cystic fibrosis Other     History   Social History  . Marital Status: Married    Spouse Name: Zachary [name]    Number of Children: 2  . 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 Smoking status: Never Smoker   . Smokeless tobacco: Never Used  . Alcohol Use: Yes  . Drug Use: No  . Sexual Activity: Not on file   Other Topics Concern  . Not on file   Social History Narrative   Previously Chartered loss adjuster [employer]     Objective: BP 116/70 mmHg  Pulse 81  Wt 204 lb (92.534 kg)  General: Alert and Oriented, No Acute Distress HEENT: Pupils equal, round, reactive to light. Conjunctivae clear.  Moist mucous membranesNeck supple without palpable lymphadenopathy nor abnormal masses. Lungs: Clear to auscultation bilaterally, no wheezing/ronchi/rales.  Comfortable work of breathing. Good air movement. Cardiac: Regular rate and rhythm. Normal S1/S2.  No murmurs, rubs, nor gallops.  No carotid bruits Extremities: No peripheral edema.  Strong peripheral pulses.  Mental Status: No depression, anxiety,  nor agitation. Skin: Warm and dry.  Assessment & Plan: Zachary [name] was seen today for f/u bp and f/u thyroid.  Diagnoses and associated orders for this visit:  Essential hypertension  Hypothyroidism, unspecified hypothyroidism type - TSH    Essential hypertension: Controlled continue lisinopril Hypothyroidism: Clinically controlled due for TSH today ultimate follow-up will be based on TSH results today, 3 versus 6 months   Return if symptoms worsen or fail to improve.

## 2015-01-31 ENCOUNTER — Telehealth: Payer: Self-pay | Admitting: Family Medicine

## 2015-01-31 LAB — TSH: TSH: 2.356 u[IU]/mL (ref 0.350–4.500)

## 2015-01-31 MED ORDER — LEVOTHYROXINE SODIUM 100 MCG PO TABS: 100.0000 ug | ORAL_TABLET | Freq: Every day | ORAL | Status: DC

## 2015-01-31 NOTE — Telephone Encounter (Signed)
Zachary [name], Will you please let patient know that his thyroid supplementation appears to be adequate.  I've sent refills of levothyroxine to the [facility].  I'd recommend followup in six months.

## 2015-01-31 NOTE — Telephone Encounter (Signed)
Pt notified and transferred up front ot schedule appt

## 2015-03-06 ENCOUNTER — Other Ambulatory Visit: Payer: Self-pay | Admitting: Family Medicine

## 2015-03-23 ENCOUNTER — Other Ambulatory Visit: Payer: Self-pay | Admitting: Family Medicine

## 2015-06-14 ENCOUNTER — Other Ambulatory Visit: Payer: Self-pay | Admitting: Family Medicine

## 2015-06-20 ENCOUNTER — Other Ambulatory Visit: Payer: Self-pay | Admitting: Orthopedic Surgery

## 2015-06-20 DIAGNOSIS — M75102 Unspecified rotator cuff tear or rupture of left shoulder, not specified as traumatic: Secondary | ICD-10-CM

## 2015-06-27 ENCOUNTER — Ambulatory Visit (INDEPENDENT_AMBULATORY_CARE_PROVIDER_SITE_OTHER): Payer: 59

## 2015-06-27 DIAGNOSIS — M75102 Unspecified rotator cuff tear or rupture of left shoulder, not specified as traumatic: Secondary | ICD-10-CM | POA: Diagnosis not present

## 2015-07-05 DIAGNOSIS — M75122 Complete rotator cuff tear or rupture of left shoulder, not specified as traumatic: Secondary | ICD-10-CM | POA: Diagnosis present

## 2015-07-05 NOTE — H&P (Signed)
Zachary [name] is an 64 y.o. male.   Chief Complaint: left shoulder pain HPI: Patient is a 64 year-old gentleman presenting with a new problem of left shoulder pain.  Patient reports that pain started in February and has been getting progressively worse over the last 3-4 months.  Denies any specific injury or trauma.  Pain is worse with overhead activities.  Denies any weakness.  No numbness or tingling that radiates down his arm.  Pain does wake him up at night.  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  Patient does have a history of right shoulder rotator cuff pathology and capsular adhesion that required repair and manipulation in 2015.  Denies any decrease in range of motion in this left shoulder today.  MRI on 06/26/2014 shows full thickness rotator cuff tear  Past Medical History  Diagnosis Date  . Hyperlipidemia   . Hypertension   . Insomnia   . Hypothyroidism   . GERD (gastroesophageal reflux disease)   . Arthritis   . Hemorrhoids   . Hearing problem     hearing deficit  . Diverticulitis   . Wears glasses   . Wears hearing aid     both ears  . Cancer 2015    positive prostate cancer bx  . Sleep apnea     uses a cpap    Past Surgical History  Procedure Laterality Date  . Hemorrhoid surgery  08/2006  . Back surgery  1978    herniated disksurgery  . Tendon repair  June 06, 2011    right elbow, Dr. Percell Miller  . Cardiac catheterization  08/01/2010  . Colonoscopy    . Shoulder arthroscopy with subacromial decompression, rotator cuff repair and bicep tendon repair Right 03/10/2014    Procedure: RIGHT SHOULDER ARTHROSCOPY WITH SUBACROMIAL DECOMPRESSION, PARTIAL ACROMIOPLASTY WITH CORACOAROMIAL LABRUM DEBRIDEMENT RELEASE DISTAL CLAVICULECTOMY,  ROTATOR CUFF REPAIR AND EXTENSIVE DEBRIDEMENT;  Surgeon: Ninetta Lights, MD;  Location: [place];  Service: Orthopedics;  Laterality: Right;  . Exam  under anesthesia with manipulation of shoulder Right 09/15/2014    Procedure: RIGHT SHOULDER MANIPULATION UNDER ANESTHESIA;  Surgeon: Ninetta Lights, MD;  Location: [place];  Service: Orthopedics;  Laterality: Right;    Family History  Problem Relation Age of Onset  . Heart disease Father   . Breast cancer Mother   . Cancer Brother   . Alcohol abuse Other   . Arthritis Other   . Cancer Other     Breast, Prostate  . Coronary artery disease Other   . Irritable bowel syndrome Other   . Cystic fibrosis Other    Social History:  reports that he has never smoked. He has never used smokeless tobacco. He reports that he drinks alcohol. He reports that he does not use illicit drugs.  Allergies:  Allergies  Allergen Reactions  . Oxycodone Itching  . Simvastatin     REACTION: muscle pain    No prescriptions prior to admission   No current facility-administered medications for this encounter.  Current outpatient prescriptions:  .  aspirin EC 81 MG EC tablet, Take 81 mg by mouth daily.  , Disp: , Rfl:  .  cyclobenzaprine (FLEXERIL) 10 MG tablet, Take a half to a full tab every 8-12 hours only as needed for muscle spasm or back pain, may cause sedation., Disp: 45 tablet, Rfl: 0 .  levothyroxine (SYNTHROID, LEVOTHROID)  100 MCG tablet, Take 1 tablet (100 mcg total) by mouth daily., Disp: 90 tablet, Rfl: 1 .  lisinopril (PRINIVIL,ZESTRIL) 5 MG tablet, TAKE 1 TABLET (5 MG) BY MOUTH DAILY., Disp: 30 tablet, Rfl: 2 .  loratadine (CLARITIN) 10 MG tablet, Take 10 mg by mouth daily.  , Disp: , Rfl:  .  omeprazole (PRILOSEC) 40 MG capsule, One by mouth daily at least one hour before a meal., Disp: 90 capsule, Rfl: 2 .  pravastatin (PRAVACHOL) 40 MG tablet, TAKE 1/2 TABLET BY MOUTH DAILY., Disp: 45 tablet, Rfl: 0  Review of Systems  Constitutional: Negative.   HENT: Negative.   Eyes: Negative.   Respiratory: Negative.   Cardiovascular: Negative.   Gastrointestinal: Negative.    Musculoskeletal: Positive for joint pain.       Left shoulder pain  Skin: Negative.   Neurological: Negative.   Endo/Heme/Allergies: Negative.   Psychiatric/Behavioral: Negative.     There were no vitals taken for this visit. Physical Exam  Constitutional: He is oriented to person, place, and time. He appears well-developed and well-nourished.  HENT:  Head: Normocephalic and atraumatic.  Eyes: Conjunctivae and EOM are normal. Pupils are equal, round, and reactive to light.  Neck: Neck supple.  Cardiovascular: Normal rate.   Respiratory: Effort normal.  GI: Soft.  Genitourinary:  Not pertinent to current symptomatology therefore not examined.  Musculoskeletal:   Examination of his left shoulder reveals decreased range of motion in forward flexion actively to approximately 150 degrees, passively to 160 degrees.  Both are very painful.  Internal and external rotation is limited to approximately 40 degrees.  Patient has diffuse weakness in his rotator cuff muscles, 3+/5 bilaterally.  Positive Neer's and Hawkins for discomfort.  Neurovascularly intact with normal motor sensory function and distal pulses.    Neurological: He is alert and oriented to person, place, and time.  Skin: Skin is warm and dry.  Psychiatric: He has a normal mood and affect.     Assessment Principal Problem:   Complete rotator cuff tear of left shoulder Active Problems:   Hypothyroidism   Essential hypertension   GERD   Prostate cancer   OSA (obstructive sleep apnea)   Plan Left shoulder arthroscopy with SAD and DCE and Rotator cuff repair.  Dr Percell Miller discussed the risks, benefits, and possible complications of the procedure in detail with the patient.  The patient is without question.  Zachary [name] J 07/05/2015, 8:39 PM

## 2015-07-06 ENCOUNTER — Encounter (HOSPITAL_BASED_OUTPATIENT_CLINIC_OR_DEPARTMENT_OTHER): Payer: Self-pay | Admitting: *Deleted

## 2015-07-06 ENCOUNTER — Encounter (HOSPITAL_BASED_OUTPATIENT_CLINIC_OR_DEPARTMENT_OTHER)
Admission: RE | Admit: 2015-07-06 | Discharge: 2015-07-06 | Disposition: A | Discharge status: Home / Self Care | Payer: 59 | Source: Ambulatory Visit | Attending: Orthopedic Surgery | Admitting: Orthopedic Surgery

## 2015-07-06 DIAGNOSIS — E039 Hypothyroidism, unspecified: Secondary | ICD-10-CM | POA: Diagnosis not present

## 2015-07-06 DIAGNOSIS — M75122 Complete rotator cuff tear or rupture of left shoulder, not specified as traumatic: Secondary | ICD-10-CM | POA: Diagnosis not present

## 2015-07-06 DIAGNOSIS — M19012 Primary osteoarthritis, left shoulder: Secondary | ICD-10-CM | POA: Diagnosis not present

## 2015-07-06 DIAGNOSIS — M7542 Impingement syndrome of left shoulder: Secondary | ICD-10-CM | POA: Diagnosis present

## 2015-07-06 DIAGNOSIS — Z8546 Personal history of malignant neoplasm of prostate: Secondary | ICD-10-CM | POA: Diagnosis not present

## 2015-07-06 DIAGNOSIS — G4733 Obstructive sleep apnea (adult) (pediatric): Secondary | ICD-10-CM | POA: Diagnosis not present

## 2015-07-06 DIAGNOSIS — E785 Hyperlipidemia, unspecified: Secondary | ICD-10-CM | POA: Diagnosis not present

## 2015-07-06 DIAGNOSIS — Z7982 Long term (current) use of aspirin: Secondary | ICD-10-CM | POA: Diagnosis not present

## 2015-07-06 DIAGNOSIS — I10 Essential (primary) hypertension: Secondary | ICD-10-CM | POA: Diagnosis not present

## 2015-07-06 DIAGNOSIS — Z79899 Other long term (current) drug therapy: Secondary | ICD-10-CM | POA: Diagnosis not present

## 2015-07-06 DIAGNOSIS — K219 Gastro-esophageal reflux disease without esophagitis: Secondary | ICD-10-CM | POA: Diagnosis not present

## 2015-07-06 NOTE — Progress Notes (Signed)
Pt to come in today for labs/EKG

## 2015-07-07 ENCOUNTER — Ambulatory Visit (HOSPITAL_BASED_OUTPATIENT_CLINIC_OR_DEPARTMENT_OTHER): Payer: 59 | Admitting: Anesthesiology

## 2015-07-07 ENCOUNTER — Ambulatory Visit (HOSPITAL_BASED_OUTPATIENT_CLINIC_OR_DEPARTMENT_OTHER)
Admission: RE | Admit: 2015-07-07 | Discharge: 2015-07-07 | Disposition: A | Discharge status: Home / Self Care | Payer: 59 | Source: Ambulatory Visit | Attending: Orthopedic Surgery | Admitting: Orthopedic Surgery

## 2015-07-07 ENCOUNTER — Encounter (HOSPITAL_BASED_OUTPATIENT_CLINIC_OR_DEPARTMENT_OTHER): Payer: Self-pay | Admitting: *Deleted

## 2015-07-07 ENCOUNTER — Encounter (HOSPITAL_BASED_OUTPATIENT_CLINIC_OR_DEPARTMENT_OTHER)
Admission: RE | Disposition: A | Discharge status: Home / Self Care | Payer: Self-pay | Source: Ambulatory Visit | Attending: Orthopedic Surgery

## 2015-07-07 DIAGNOSIS — Z8546 Personal history of malignant neoplasm of prostate: Secondary | ICD-10-CM | POA: Insufficient documentation

## 2015-07-07 DIAGNOSIS — Z79899 Other long term (current) drug therapy: Secondary | ICD-10-CM | POA: Insufficient documentation

## 2015-07-07 DIAGNOSIS — I10 Essential (primary) hypertension: Secondary | ICD-10-CM | POA: Diagnosis present

## 2015-07-07 DIAGNOSIS — E039 Hypothyroidism, unspecified: Secondary | ICD-10-CM | POA: Diagnosis present

## 2015-07-07 DIAGNOSIS — Z7982 Long term (current) use of aspirin: Secondary | ICD-10-CM | POA: Insufficient documentation

## 2015-07-07 DIAGNOSIS — K219 Gastro-esophageal reflux disease without esophagitis: Secondary | ICD-10-CM | POA: Diagnosis present

## 2015-07-07 DIAGNOSIS — M75122 Complete rotator cuff tear or rupture of left shoulder, not specified as traumatic: Secondary | ICD-10-CM | POA: Diagnosis present

## 2015-07-07 DIAGNOSIS — M19012 Primary osteoarthritis, left shoulder: Secondary | ICD-10-CM | POA: Insufficient documentation

## 2015-07-07 DIAGNOSIS — M7542 Impingement syndrome of left shoulder: Secondary | ICD-10-CM | POA: Insufficient documentation

## 2015-07-07 DIAGNOSIS — G4733 Obstructive sleep apnea (adult) (pediatric): Secondary | ICD-10-CM | POA: Diagnosis present

## 2015-07-07 DIAGNOSIS — C61 Malignant neoplasm of prostate: Secondary | ICD-10-CM | POA: Diagnosis present

## 2015-07-07 DIAGNOSIS — E785 Hyperlipidemia, unspecified: Secondary | ICD-10-CM | POA: Insufficient documentation

## 2015-07-07 HISTORY — PX: SHOULDER ARTHROSCOPY WITH DISTAL CLAVICLE RESECTION: SHX5675

## 2015-07-07 HISTORY — PX: SHOULDER ARTHROSCOPY WITH ROTATOR CUFF REPAIR AND SUBACROMIAL DECOMPRESSION: SHX5686

## 2015-07-07 LAB — POCT HEMOGLOBIN-HEMACUE: HEMOGLOBIN: 16.1 g/dL (ref 13.0–17.0)

## 2015-07-07 SURGERY — SHOULDER ARTHROSCOPY WITH ROTATOR CUFF REPAIR AND SUBACROMIAL DECOMPRESSION
Anesthesia: Regional | Site: Shoulder | Laterality: Left

## 2015-07-07 MED ORDER — FENTANYL CITRATE (PF) 100 MCG/2ML IJ SOLN
INTRAMUSCULAR | Status: AC
  Filled 2015-07-07: qty 2

## 2015-07-07 MED ORDER — PROPOFOL 10 MG/ML IV BOLUS
INTRAVENOUS | Status: DC | PRN
  Administered 2015-07-07: 200 mg via INTRAVENOUS

## 2015-07-07 MED ORDER — LACTATED RINGERS IV SOLN
INTRAVENOUS | Status: DC
  Administered 2015-07-07 (×2): via INTRAVENOUS

## 2015-07-07 MED ORDER — LIDOCAINE HCL (CARDIAC) 10 MG/ML IV SOLN
INTRAVENOUS | Status: DC | PRN
  Administered 2015-07-07: 80 mg via INTRAVENOUS

## 2015-07-07 MED ORDER — ONDANSETRON HCL 4 MG PO TABS: 4.0000 mg | ORAL_TABLET | Freq: Three times a day (TID) | ORAL | Status: DC | PRN

## 2015-07-07 MED ORDER — SCOPOLAMINE 1 MG/3DAYS TD PT72: 1.0000 | MEDICATED_PATCH | Freq: Once | TRANSDERMAL | Status: DC | PRN

## 2015-07-07 MED ORDER — HYDROMORPHONE HCL 1 MG/ML IJ SOLN: 0.2500 mg | INTRAMUSCULAR | Status: DC | PRN

## 2015-07-07 MED ORDER — SUCCINYLCHOLINE CHLORIDE 20 MG/ML IJ SOLN
INTRAMUSCULAR | Status: DC | PRN
  Administered 2015-07-07: 100 mg via INTRAVENOUS

## 2015-07-07 MED ORDER — PROPOFOL 10 MG/ML IV BOLUS
INTRAVENOUS | Status: AC
  Filled 2015-07-07: qty 40

## 2015-07-07 MED ORDER — GLYCOPYRROLATE 0.2 MG/ML IJ SOLN: 0.2000 mg | Freq: Once | INTRAMUSCULAR | Status: DC | PRN

## 2015-07-07 MED ORDER — CEFAZOLIN SODIUM-DEXTROSE 2-3 GM-% IV SOLR
2.0000 g | INTRAVENOUS | Status: AC
  Administered 2015-07-07: 2 g via INTRAVENOUS

## 2015-07-07 MED ORDER — MEPERIDINE HCL 25 MG/ML IJ SOLN: 6.2500 mg | INTRAMUSCULAR | Status: DC | PRN

## 2015-07-07 MED ORDER — FENTANYL CITRATE (PF) 100 MCG/2ML IJ SOLN
INTRAMUSCULAR | Status: AC
  Filled 2015-07-07: qty 6

## 2015-07-07 MED ORDER — DEXAMETHASONE SODIUM PHOSPHATE 4 MG/ML IJ SOLN
INTRAMUSCULAR | Status: DC | PRN
  Administered 2015-07-07: 10 mg via INTRAVENOUS

## 2015-07-07 MED ORDER — OXYCODONE HCL 5 MG PO TABS
5.0000 mg | ORAL_TABLET | Freq: Once | ORAL | Status: DC | PRN
Start: 2015-07-07 — End: 2015-07-07

## 2015-07-07 MED ORDER — ONDANSETRON HCL 4 MG/2ML IJ SOLN
INTRAMUSCULAR | Status: DC | PRN
  Administered 2015-07-07: 4 mg via INTRAVENOUS

## 2015-07-07 MED ORDER — CEFAZOLIN SODIUM-DEXTROSE 2-3 GM-% IV SOLR
INTRAVENOUS | Status: AC
  Filled 2015-07-07: qty 50

## 2015-07-07 MED ORDER — MIDAZOLAM HCL 2 MG/2ML IJ SOLN
INTRAMUSCULAR | Status: AC
  Filled 2015-07-07: qty 2

## 2015-07-07 MED ORDER — HYDROMORPHONE HCL 2 MG PO TABS: ORAL_TABLET | ORAL | Status: DC

## 2015-07-07 MED ORDER — POVIDONE-IODINE 7.5 % EX SOLN: Freq: Once | CUTANEOUS | Status: DC

## 2015-07-07 MED ORDER — LIDOCAINE HCL 4 % MT SOLN
OROMUCOSAL | Status: DC | PRN
  Administered 2015-07-07: 3 mL via TOPICAL

## 2015-07-07 MED ORDER — SODIUM CHLORIDE 0.9 % IR SOLN
Status: DC | PRN
  Administered 2015-07-07: 18000 mL

## 2015-07-07 MED ORDER — FENTANYL CITRATE (PF) 100 MCG/2ML IJ SOLN
50.0000 ug | INTRAMUSCULAR | Status: DC | PRN
  Administered 2015-07-07: 100 ug via INTRAVENOUS

## 2015-07-07 MED ORDER — MIDAZOLAM HCL 2 MG/2ML IJ SOLN
1.0000 mg | INTRAMUSCULAR | Status: DC | PRN
  Administered 2015-07-07: 2 mg via INTRAVENOUS

## 2015-07-07 MED ORDER — OXYCODONE HCL 5 MG/5ML PO SOLN: 5.0000 mg | Freq: Once | ORAL | Status: DC | PRN

## 2015-07-07 MED ORDER — BUPIVACAINE-EPINEPHRINE (PF) 0.5% -1:200000 IJ SOLN
INTRAMUSCULAR | Status: DC | PRN
  Administered 2015-07-07: 25 mL via PERINEURAL

## 2015-07-07 SURGICAL SUPPLY — 70 items
ANCHOR PEEK 4.75X19.1 SWLK C (Anchor) ×6 IMPLANT
BENZOIN TINCTURE PRP APPL 2/3 (GAUZE/BANDAGES/DRESSINGS) IMPLANT
BLADE CUTTER GATOR 3.5 (BLADE) ×3 IMPLANT
BLADE CUTTER MENIS 5.5 (BLADE) IMPLANT
BLADE GREAT WHITE 4.2 (BLADE) ×2 IMPLANT
BLADE GREAT WHITE 4.2MM (BLADE) ×1
BLADE SURG 15 STRL LF DISP TIS (BLADE) ×1 IMPLANT
BLADE SURG 15 STRL SS (BLADE) ×2
BUR OVAL 6.0 (BURR) ×3 IMPLANT
CANNULA DRY DOC 8X75 (CANNULA) IMPLANT
CANNULA TWIST IN 8.25X7CM (CANNULA) IMPLANT
CLOSURE WOUND 1/2 X4 (GAUZE/BANDAGES/DRESSINGS)
DECANTER SPIKE VIAL GLASS SM (MISCELLANEOUS) IMPLANT
DRAPE STERI 35X30 U-POUCH (DRAPES) ×3 IMPLANT
DRAPE U-SHAPE 47X51 STRL (DRAPES) ×3 IMPLANT
DRAPE U-SHAPE 76X120 STRL (DRAPES) ×6 IMPLANT
DRSG PAD ABDOMINAL 8X10 ST (GAUZE/BANDAGES/DRESSINGS) ×3 IMPLANT
DURAPREP 26ML APPLICATOR (WOUND CARE) ×6 IMPLANT
ELECT MENISCUS 165MM 90D (ELECTRODE) ×3 IMPLANT
ELECT REM PT RETURN 9FT ADLT (ELECTROSURGICAL) ×3
ELECTRODE REM PT RTRN 9FT ADLT (ELECTROSURGICAL) ×1 IMPLANT
GAUZE SPONGE 4X4 12PLY STRL (GAUZE/BANDAGES/DRESSINGS) ×6 IMPLANT
GAUZE XEROFORM 1X8 LF (GAUZE/BANDAGES/DRESSINGS) ×3 IMPLANT
GLOVE BIOGEL PI IND STRL 7.0 (GLOVE) ×1 IMPLANT
GLOVE BIOGEL PI INDICATOR 7.0 (GLOVE) ×2
GLOVE ECLIPSE 7.0 STRL STRAW (GLOVE) ×3 IMPLANT
GLOVE ORTHO TXT STRL SZ7.5 (GLOVE) ×3 IMPLANT
GLOVE SURG ORTHO 8.0 STRL STRW (GLOVE) ×3 IMPLANT
GOWN STRL REUS W/ TWL LRG LVL3 (GOWN DISPOSABLE) ×2 IMPLANT
GOWN STRL REUS W/ TWL XL LVL3 (GOWN DISPOSABLE) ×1 IMPLANT
GOWN STRL REUS W/TWL LRG LVL3 (GOWN DISPOSABLE) ×4
GOWN STRL REUS W/TWL XL LVL3 (GOWN DISPOSABLE) ×2
IV NS IRRIG 3000ML ARTHROMATIC (IV SOLUTION) ×12 IMPLANT
MANIFOLD NEPTUNE II (INSTRUMENTS) ×3 IMPLANT
NDL SUT 6 .5 CRC .975X.05 MAYO (NEEDLE) IMPLANT
NEEDLE MAYO TAPER (NEEDLE)
NEEDLE SCORPION MULTI FIRE (NEEDLE) ×3 IMPLANT
NS IRRIG 1000ML POUR BTL (IV SOLUTION) IMPLANT
PACK ARTHROSCOPY DSU (CUSTOM PROCEDURE TRAY) ×3 IMPLANT
PACK BASIN DAY SURGERY FS (CUSTOM PROCEDURE TRAY) ×3 IMPLANT
PASSER SUT SWANSON 36MM LOOP (INSTRUMENTS) IMPLANT
PENCIL BUTTON HOLSTER BLD 10FT (ELECTRODE) ×3 IMPLANT
SET ARTHROSCOPY TUBING (MISCELLANEOUS) ×2
SET ARTHROSCOPY TUBING LN (MISCELLANEOUS) ×1 IMPLANT
SLEEVE SCD COMPRESS KNEE MED (MISCELLANEOUS) ×3 IMPLANT
SLING ARM IMMOBILIZER LRG (SOFTGOODS) IMPLANT
SLING ARM IMMOBILIZER MED (SOFTGOODS) IMPLANT
SLING ARM LRG ADULT FOAM STRAP (SOFTGOODS) IMPLANT
SLING ARM MED ADULT FOAM STRAP (SOFTGOODS) IMPLANT
SLING ARM XL FOAM STRAP (SOFTGOODS) IMPLANT
SPONGE LAP 4X18 X RAY DECT (DISPOSABLE) ×3 IMPLANT
STRIP CLOSURE SKIN 1/2X4 (GAUZE/BANDAGES/DRESSINGS) IMPLANT
SUCTION FRAZIER TIP 10 FR DISP (SUCTIONS) IMPLANT
SUT ETHIBOND 2 OS 4 DA (SUTURE) IMPLANT
SUT ETHILON 2 0 FS 18 (SUTURE) IMPLANT
SUT ETHILON 3 0 PS 1 (SUTURE) IMPLANT
SUT FIBERWIRE #2 38 T-5 BLUE (SUTURE)
SUT RETRIEVER MED (INSTRUMENTS) IMPLANT
SUT TIGER TAPE 7 IN WHITE (SUTURE) ×3 IMPLANT
SUT VIC AB 0 CT1 27 (SUTURE)
SUT VIC AB 0 CT1 27XBRD ANBCTR (SUTURE) IMPLANT
SUT VIC AB 2-0 SH 27 (SUTURE)
SUT VIC AB 2-0 SH 27XBRD (SUTURE) IMPLANT
SUT VIC AB 3-0 FS2 27 (SUTURE) IMPLANT
SUTURE FIBERWR #2 38 T-5 BLUE (SUTURE) IMPLANT
TAPE FIBER 2MM 7IN #2 BLUE (SUTURE) ×3 IMPLANT
TOWEL OR 17X24 6PK STRL BLUE (TOWEL DISPOSABLE) ×3 IMPLANT
TOWEL OR NON WOVEN STRL DISP B (DISPOSABLE) ×3 IMPLANT
WATER STERILE IRR 1000ML POUR (IV SOLUTION) ×3 IMPLANT
YANKAUER SUCT BULB TIP NO VENT (SUCTIONS) IMPLANT

## 2015-07-07 NOTE — Progress Notes (Signed)
Assisted Dr. Crews with left, ultrasound guided, interscalene  block. Side rails up, monitors on throughout procedure. See vital signs in flow sheet. Tolerated Procedure well. 

## 2015-07-07 NOTE — Transfer of Care (Signed)
Immediate Anesthesia Transfer of Care Note  Patient: Zachary [name]  Procedure(s) Performed: Procedure(s) with comments: LEFT SHOULDER SCOPE DEBRIDEMENT, SUBACROMIAL DECOMPRESSION, DISTAL CLAVICULECTOMY, ROTATOR CUFF REPAIR   (Left) - ANESTHESIA: GENERAL, PRE/POST OP SCALENE  Patient Location: PACU  Anesthesia Type:GA combined with regional for post-op pain  Level of Consciousness: awake, alert  and oriented  Airway & Oxygen Therapy: Patient Spontanous Breathing and Patient connected to face mask oxygen  Post-op Assessment: Report given to RN and Post -op Vital signs reviewed and stable  Post vital signs: Reviewed and stable  Last Vitals:  Filed Vitals:   07/07/15 1512  BP:   Pulse: 80  Temp:   Resp: 11    Complications: No apparent anesthesia complications

## 2015-07-07 NOTE — Anesthesia Preprocedure Evaluation (Addendum)
Anesthesia Evaluation  Patient identified by MRN, date of birth, ID band Patient awake    Reviewed: Allergy & Precautions, NPO status , Patient's Chart, lab work & pertinent test results  Airway Mallampati: I  TM Distance: >3 FB Neck ROM: Full    Dental  (+) Teeth Intact, Dental Advisory Given   Pulmonary sleep apnea and Continuous Positive Airway Pressure Ventilation ,  breath sounds clear to auscultation        Cardiovascular hypertension, Pt. on medications Rhythm:Regular Rate:Normal     Neuro/Psych    GI/Hepatic GERD-  Medicated and Controlled,  Endo/Other    Renal/GU      Musculoskeletal   Abdominal   Peds  Hematology   Anesthesia Other Findings   Reproductive/Obstetrics                            Anesthesia Physical Anesthesia Plan  ASA: III  Anesthesia Plan: General and Regional   Post-op Pain Management:    Induction: Intravenous  Airway Management Planned: Oral ETT  Additional Equipment:   Intra-op Plan:   Post-operative Plan: Extubation in OR  Informed Consent: I have reviewed the patients History and Physical, chart, labs and discussed the procedure including the risks, benefits and alternatives for the proposed anesthesia with the patient or authorized representative who has indicated his/her understanding and acceptance.   Dental advisory given  Plan Discussed with: CRNA, Anesthesiologist and Surgeon  Anesthesia Plan Comments:         Anesthesia Quick Evaluation

## 2015-07-07 NOTE — Interval H&P Note (Signed)
History and Physical Interval Note:  07/07/2015 1:25 PM  Zachary [name]  has presented today for surgery, with the diagnosis of OTHER ARTICULAR CARTILAGE DISORDERS LEFT SHOULDER, PRIMARY OSTEOARTHRITIS LEFT SHOULDER, IMPINGEMENT SYNDROME OF LEFT SHOULDER, STRAIN OF MUSCLES  AND TENDONS OF THE ROTATOR CUFF OF   The various methods of treatment have been discussed with the patient and family. After consideration of risks, benefits and other options for treatment, the patient has consented to  Procedure(s) with comments: LEFT SHOULDER SCOPE DEBRIDEMENT, SUBACROMIAL DECOMPRESSION, DISTAL CLAVICULECTOMY, ROTATOR CUFF REPAIR AND BICEP TENODESIS (Left) - ANESTHESIA: GENERAL, PRE/POST OP SCALENE as a surgical intervention .  The patient's history has been reviewed, patient examined, no change in status, stable for surgery.  I have reviewed the patient's chart and labs.  Questions were answered to the patient's satisfaction.     [name]

## 2015-07-07 NOTE — Anesthesia Postprocedure Evaluation (Signed)
Anesthesia Post Note  Patient: Zachary [name]  Procedure(s) Performed: Procedure(s) (LRB): LEFT SHOULDER SCOPE DEBRIDEMENT, SUBACROMIAL DECOMPRESSION, DISTAL CLAVICULECTOMY, ROTATOR CUFF REPAIR   (Left) SHOULDER ARTHROSCOPY WITH DISTAL CLAVICLE RESECTION (Left)  Anesthesia type: General  Patient location: PACU  Post pain: Pain level controlled and Adequate analgesia  Post assessment: Post-op Vital signs reviewed, Patient's Cardiovascular Status Stable, Respiratory Function Stable, Patent Airway and Pain level controlled  Last Vitals:  Filed Vitals:   07/07/15 1600  BP:   Pulse: 72  Temp:   Resp: 16    Post vital signs: Reviewed and stable  Level of consciousness: awake, alert  and oriented  Complications: No apparent anesthesia complications

## 2015-07-07 NOTE — Discharge Instructions (Signed)
Shouder arthroscopy, rotator cuff repair, subacromial decompression °Care After Instructions °Refer to this sheet in the next few weeks. These discharge instructions provide you with general information on caring for yourself after you leave the hospital. Your caregiver may also give you specific instructions. Your treatment has been planned according to the most current medical practices available, but unavoidable complications sometimes occur. If you have any problems or questions after discharge, please call your caregiver. °HOME INSTRUCTIONS °You may resume a normal diet and activities as directed.  °Take showers instead of baths until informed otherwise.  °Change bandages (dressings) in 3 days.  Swab wounds daily with betadine.  Wash shoulder with soap and water.  Pat dry.  Cover wounds with bandaids. °Only take over-the-counter or prescription medicines for pain, discomfort, or fever as directed by your caregiver.  °Wear your sling for the next 6 weeks unless otherwise instructed. °Eat a well-balanced diet.  °Avoid lifting or driving until you are instructed otherwise.  °Make an appointment to see your caregiver for stitches (suture) or staple removal one week after surgery.  ° °SEEK MEDICAL CARE IF: °You have swelling of your calf or leg.  °You develop shortness of breath or chest pain.  °You have redness, swelling, or increasing pain in the wound.  °There is pus or any unusual drainage coming from the surgical site.  °You notice a bad smell coming from the surgical site or dressing.  °The surgical site breaks open after sutures or staples have been removed.  °There is persistent bleeding from the suture or staple line.  °You are getting worse or are not improving.  °You have any other questions or concerns.  °SEEK IMMEDIATE MEDICAL CARE IF:  °You have a fever greater than 101 °You develop a rash.  °You have difficulty breathing.  °You develop any reaction or side effects to medicines given.  °Your knee  motion is decreasing rather than improving.  °MAKE SURE YOU:  °Understand these instructions.  °Will watch your condition.  °Will get help right away if you are not doing well or get worse.  ° ° °Post Anesthesia Home Care Instructions ° °Activity: °Get plenty of rest for the remainder of the day. A responsible adult should stay with you for 24 hours following the procedure.  °For the next 24 hours, DO NOT: °-Drive a car °-Operate machinery °-Drink alcoholic beverages °-Take any medication unless instructed by your physician °-Make any legal decisions or sign important papers. ° °Meals: °Start with liquid foods such as gelatin or soup. Progress to regular foods as tolerated. Avoid greasy, spicy, heavy foods. If nausea and/or vomiting occur, drink only clear liquids until the nausea and/or vomiting subsides. Call your physician if vomiting continues. ° °Special Instructions/Symptoms: °Your throat may feel dry or sore from the anesthesia or the breathing tube placed in your throat during surgery. If this causes discomfort, gargle with warm salt water. The discomfort should disappear within 24 hours. ° °If you had a scopolamine patch placed behind your ear for the management of post- operative nausea and/or vomiting: ° °1. The medication in the patch is effective for 72 hours, after which it should be removed.  Wrap patch in a tissue and discard in the trash. Wash hands thoroughly with soap and water. °2. You may remove the patch earlier than 72 hours if you experience unpleasant side effects which may include dry mouth, dizziness or visual disturbances. °3. Avoid touching the patch. Wash your hands with soap and water after contact with   the patch. °  °Regional Anesthesia Blocks ° °1. Numbness or the inability to move the "blocked" extremity may last from 3-48 hours after placement. The length of time depends on the medication injected and your individual response to the medication. If the numbness is not going away  after 48 hours, call your surgeon. ° °2. The extremity that is blocked will need to be protected until the numbness is gone and the  Strength has returned. Because you cannot feel it, you will need to take extra care to avoid injury. Because it may be weak, you may have difficulty moving it or using it. You may not know what position it is in without looking at it while the block is in effect. ° °3. For blocks in the legs and feet, returning to weight bearing and walking needs to be done carefully. You will need to wait until the numbness is entirely gone and the strength has returned. You should be able to move your leg and foot normally before you try and bear weight or walk. You will need someone to be with you when you first try to ensure you do not fall and possibly risk injury. ° °4. Bruising and tenderness at the needle site are common side effects and will resolve in a few days. ° °5. Persistent numbness or new problems with movement should be communicated to the surgeon or the [facility] ([phone])/ [facility] ([phone]). ° °

## 2015-07-07 NOTE — Anesthesia Procedure Notes (Addendum)
Anesthesia Regional Block:  Interscalene brachial plexus block  Pre-Anesthetic Checklist: ,, timeout performed, Correct Patient, Correct Site, Correct Laterality, Correct Procedure, Correct Position, site marked, Risks and benefits discussed,  Surgical consent,  Pre-op evaluation,  At surgeon's request and post-op pain management  Laterality: Left and Upper  Prep: chloraprep       Needles:  Injection technique: Single-shot  Needle Type: Echogenic Needle     Needle Length: 5cm 5 cm Needle Gauge: 21 and 21 G    Additional Needles:  Procedures: ultrasound guided (picture in chart) Interscalene brachial plexus block Narrative:  Start time: 07/07/2015 12:10 PM End time: 07/07/2015 12:15 PM Injection made incrementally with aspirations every 5 mL.  Performed by: Personally  Anesthesiologist: CREWS, DAVID   Procedure Name: Intubation Date/Time: 07/07/2015 1:44 PM Performed by: Maryella Shivers Pre-anesthesia Checklist: Patient identified, Emergency Drugs available, Suction available and Patient being monitored Patient Re-evaluated:Patient Re-evaluated prior to inductionOxygen Delivery Method: Circle System Utilized Preoxygenation: Pre-oxygenation with 100% oxygen Intubation Type: IV induction Ventilation: Mask ventilation without difficulty Laryngoscope Size: Mac and 3 Grade View: Grade I Tube type: Oral Tube size: 8.0 mm Number of attempts: 1 Airway Equipment and Method: Stylet and Oral airway Placement Confirmation: ETT inserted through vocal cords under direct vision,  positive ETCO2 and breath sounds checked- equal and bilateral Secured at: 22 cm Tube secured with: Tape Dental Injury: Teeth and Oropharynx as per pre-operative assessment

## 2015-07-10 ENCOUNTER — Encounter (HOSPITAL_BASED_OUTPATIENT_CLINIC_OR_DEPARTMENT_OTHER): Payer: Self-pay | Admitting: Orthopedic Surgery

## 2015-07-10 NOTE — Op Note (Signed)
NAME:  Zachary [name], Zachary [name] NO.:  [id number]  MEDICAL RECORD NO.:  [id number]  LOCATION:                               FACILITY:  [facility]  PHYSICIAN:  Ninetta Lights, M.D. DATE OF BIRTH:  28-Feb-1951  DATE OF PROCEDURE:  07/07/2015 DATE OF DISCHARGE:  07/07/2015                              OPERATIVE REPORT   PREOPERATIVE DIAGNOSES: 1. Left shoulder impingement, massive rotator cuff tear of     supraspinatus, infraspinatus tendon. 2. Degenerative joint disease, acromioclavicular joint.  POSTOPERATIVE DIAGNOSES: 1. Left shoulder impingement, massive rotator cuff tear of     supraspinatus, infraspinatus tendon. 2. Degenerative joint disease, acromioclavicular joint. 3. Circumferential tearing of labrum. 4. Complete cuff avulsion with a lot of intertendinous tearing     calcification.  PROCEDURES: 1. Left shoulder exam under anesthesia, arthroscopy. 2. Debridement of labral tears. 3. Debridement and immobilization of rotator cuff. 4. Bursectomy, acromioplasty, coracoacromial ligament release. 5. Excision of distal clavicle. 6. Arthroscopic-assisted rotator cuff repair with FiberWire suture x2,     SwiveLock anchors x2.  SURGEON:  Ninetta Lights, M.D.  ASSISTANT:  Elmyra Ricks, PA, present throughout the entire case and necessary for timely completion of procedure.  ANESTHESIA:  General.  ESTIMATED BLOOD LOSS:  Minimal.  SPECIMENS:  None.  CULTURES:  None.  COMPLICATIONS:  None.  DRESSINGS:  Soft compressive, shoulder immobilizer.  DESCRIPTION OF PROCEDURE:  The patient was brought to the operating room, placed on the operating table in supine position.  After adequate anesthesia had been obtained, shoulder examined.  Very mild adhesive capsulitis.  Manipulated, full motion, stable shoulder.  Placed in a beach-chair position on shoulder positioner, prepped and draped in usual sterile fashion.  Three portals; anterior, posterior, and  lateral. Arthroscope introduced, shoulder distended and inspected.  Articular cartilage looked good.  Circumferential labral tear was debrided. Biceps tendon, biceps anchor intact.  Marked tearing in entire supraspinatus and top of the infraspinatus.  Retracted less than 1 cm. Debrided above, below.  Calcification remnants of torn tissue debrided. This was debrided back to a healthy tissue as possible. Musculotendinous junction intact.  Looking subacromially, tuberosity roughened.  Bursa resected.  Acromioplasty to a type 1 acromion using shaver and high-speed burr.  CA ligament released.  Grade 4 changes in the [facility] joint.  Periarticular spurs, lateral centimeter of clavicle resected.  Adequacy of decompression confirmed viewing from all portals. Lateral cannula placed.  Cuff was captured with 2 horizontal mattress sutures utilizing Scorpion device.  These were tensioned, brought over laterally and anchored down into the tuberosity with 4.5 mm pre-punched SwiveLock anchors.  At completion, nice firm, watertight closure of the cuff achieved.  Instruments and fluids were removed.  Portals were closed with nylon.  Sterile compressive dressing applied.  Shoulder immobilizer applied.  Anesthesia reversed.  Brought to the recovery room.  Tolerated the surgery well.  No complications.     Ninetta Lights, M.D.   ______________________________ Ninetta Lights, M.D.    DFM/MEDQ  D:  07/08/2015  T:  07/08/2015  Job:  [id number]

## 2015-07-19 ENCOUNTER — Ambulatory Visit: Payer: 59 | Attending: Orthopedic Surgery | Admitting: Physical Therapy

## 2015-07-19 ENCOUNTER — Encounter: Payer: Self-pay | Admitting: Physical Therapy

## 2015-07-19 DIAGNOSIS — M25512 Pain in left shoulder: Secondary | ICD-10-CM | POA: Insufficient documentation

## 2015-07-19 DIAGNOSIS — R29898 Other symptoms and signs involving the musculoskeletal system: Secondary | ICD-10-CM | POA: Diagnosis present

## 2015-07-19 DIAGNOSIS — M25612 Stiffness of left shoulder, not elsewhere classified: Secondary | ICD-10-CM | POA: Diagnosis present

## 2015-07-19 NOTE — Therapy (Signed)
[facility] [address]  [place], [place], [address] Phone: [phone]   Fax:  [phone]  Physical Therapy Evaluation  Patient Details  Name: Zachary [name] MRN: [id number] Date of Birth: 17-Sep-1951 Referring Provider:  Kathryne Hitch, MD  Encounter Date: 07/19/2015      PT End of Session - 07/19/15 0815    Visit Number 1   Number of Visits 20   Date for PT Re-Evaluation 09/27/15   PT Start Time 0808   PT Stop Time 0901   PT Time Calculation (min) 53 min      Past Medical History  Diagnosis Date  . Hyperlipidemia   . Hypertension   . Insomnia   . Hypothyroidism   . GERD (gastroesophageal reflux disease)   . Arthritis   . Hemorrhoids   . Hearing problem     hearing deficit  . Diverticulitis   . Wears glasses   . Wears hearing aid     both ears  . Cancer 2015    positive prostate cancer bx  . Sleep apnea     uses a cpap    Past Surgical History  Procedure Laterality Date  . Hemorrhoid surgery  08/2006  . Back surgery  1978    herniated disksurgery  . Tendon repair  June 06, 2011    right elbow, Dr. Percell Miller  . Cardiac catheterization  08/01/2010  . Colonoscopy    . Shoulder arthroscopy with subacromial decompression, rotator cuff repair and bicep tendon repair Right 03/10/2014    Procedure: RIGHT SHOULDER ARTHROSCOPY WITH SUBACROMIAL DECOMPRESSION, PARTIAL ACROMIOPLASTY WITH CORACOAROMIAL LABRUM DEBRIDEMENT RELEASE DISTAL CLAVICULECTOMY,  ROTATOR CUFF REPAIR AND EXTENSIVE DEBRIDEMENT;  Surgeon: Ninetta Lights, MD;  Location: [facility];  Service: Orthopedics;  Laterality: Right;  . Exam under anesthesia with manipulation of shoulder Right 09/15/2014    Procedure: RIGHT SHOULDER MANIPULATION UNDER ANESTHESIA;  Surgeon: Ninetta Lights, MD;  Location: [facility];  Service: Orthopedics;  Laterality: Right;  . Shoulder arthroscopy with rotator cuff repair and  subacromial decompression Left 07/07/2015    Procedure: LEFT SHOULDER SCOPE DEBRIDEMENT, SUBACROMIAL DECOMPRESSION, DISTAL CLAVICULECTOMY, ROTATOR CUFF REPAIR  ;  Surgeon: Kathryne Hitch, MD;  Location: [facility];  Service: Orthopedics;  Laterality: Left;  ANESTHESIA: GENERAL, PRE/POST OP SCALENE  . Shoulder arthroscopy with distal clavicle resection Left 07/07/2015    Procedure: SHOULDER ARTHROSCOPY WITH DISTAL CLAVICLE RESECTION;  Surgeon: Kathryne Hitch, MD;  Location: [facility];  Service: Orthopedics;  Laterality: Left;    There were no vitals filed for this visit.  Visit Diagnosis:  Left shoulder pain - Plan: PT plan of care cert/re-cert  Shoulder weakness - Plan: PT plan of care cert/re-cert  Shoulder stiffness, left - Plan: PT plan of care cert/re-cert      Subjective Assessment - 07/19/15 0811    Subjective pt underwent L RCR on 07/07/15 due to increasing pain and decreasing function of shoulder past several months.  States has not been perfoming any exercises to L UE.  States did ice shoulder for a few days following surgery but not lately.   Pertinent History R RCR March 2015   Patient Stated Goals regain full use of L UE   Currently in Pain? Yes   Pain Score --  AVG pain 5-6/10, worst pain 8/10   Pain Location Shoulder   Pain Orientation Left   Pain Descriptors / Indicators Sharp   Pain  Onset 1 to 4 weeks ago   Pain Frequency Intermittent   Aggravating Factors  worst at night (requires meds to sleep)   Pain Relieving Factors medication            [facility] PT Assessment - 07/19/15 0001    Assessment   Medical Diagnosis s/p L RCR   Onset Date/Surgical Date 07/07/15   Next MD Visit 08/22/15   Precautions   Precautions Shoulder   Type of Shoulder Precautions per RCR protocol   Balance Screen   Has the patient fallen in the past 6 months No   Has the patient had a decrease in activity level because of a fear of falling?  No   Is the patient  reluctant to leave their home because of a fear of falling?  No   Prior Function   Vocation Full time employment   Vocation Requirements job requires frequent reaching, lifting, pushing/pulling   Leisure walks for exercise (no other mode of exercise), enjoys woodworking   Observation/Other Assessments   Focus on Therapeutic Outcomes (FOTO)  84% limitation   ROM / Strength   AROM / PROM / Strength PROM   PROM   PROM Assessment Site Shoulder   Right/Left Shoulder Left   Left Shoulder Flexion 82 Degrees  limited by pain   Left Shoulder ABduction 70 Degrees  per protocol   Left Shoulder Internal Rotation --  to abdomen at 30 ABD   Left Shoulder External Rotation 10 Degrees  at 30 ABD             TODAY'S TREATMENT Manual - L Shoulder Grade 2 AP and Caudal glides with mobes/stretch into ER, Flex, and ABD to tolerance or per protocol; TPR R UT TherEx - HEP instruct and perform: shoulder rolls, pendulums, putty gripping (issued red putty) Vasopneumatic Compression - L Shoulder, Low Pressure, 37dg, 15'              PT Education - 07/19/15 0846    Education provided Yes   Education Details initial HEP   Person(s) Educated Patient   Methods Explanation;Demonstration;Handout   Comprehension Verbalized understanding;Returned demonstration          PT Short Term Goals - 07/19/15 0959    PT SHORT TERM GOAL #1   Title L shoulder PROM Flexion to 160, ABD to 150, and ER to 75 at 90 ABD by 08/18/15   Status New           PT Long Term Goals - 07/19/15 1000    PT LONG TERM GOAL #1   Title L Shoulder AROM WFL all planes without limit by pain by 09/27/15   Status New   PT LONG TERM GOAL #2   Title L Shoulder MMT 4/5 or better all planes by 09/27/15   Status New   PT LONG TERM GOAL #3   Title pt able to return to all ADLs, chores, and work duties without limitation by shoulder pain, LOM, or weakness by 09/27/15   Status New               Plan - 07/19/15 0953     Clinical Impression Statement pt is 12 days s/p L RCR.  L Shoulder PROM today is as follows: Flexion 82 (limited by pain and guarding), ABD 70 (limited by protocol, minimal discomfort), IR to abdomen at 30 ABD (no pain, mild intermittent guarding), ER to 10 at 30 ABD (heavy guarding and mild pain).  Pt has been wearing sling  24/7 since surgery stating he even has a sling that he wears in the shower.  High tone noted in L upper trap and pt with guarded posture including elevated sapula.  Instructed pt in shoulder rolls and pendulums to hopefully relax this and allow less guarding with stretching / manual therapy.  Pt's pain seems well controlled at this time. He states he is sleeping well and pain is intermittent in nature.   Pt will benefit from skilled therapeutic intervention in order to improve on the following deficits Pain;Postural dysfunction;Decreased strength;Decreased mobility;Hypomobility;Improper body mechanics;Decreased range of motion   Rehab Potential Good   PT Frequency 2x / week   PT Duration --  10 wks   PT Treatment/Interventions Manual techniques;Therapeutic exercise;Therapeutic activities;Patient/family education;Vasopneumatic Device;Taping;Electrical Stimulation;Cryotherapy   PT Next Visit Plan manual, PROM, may instruct in self mobes if guarding decreases and pt seems safe to perform   Consulted and Agree with Plan of Care Patient         Problem List Patient Active Problem List   Diagnosis Date Noted  . Complete rotator cuff tear of left shoulder 07/05/2015  . Tennis elbow 05/30/2014  . Rotator cuff (capsule) sprain 03/10/2014  . Right shoulder pain 02/11/2014  . Memory difficulty 12/18/2012  . Gynecomastia, male 07/30/2012  . OSA (obstructive sleep apnea) 04/23/2011  . EXTERNAL HEMORRHOIDS 02/12/2011  . Prostate cancer 11/12/2010  . PLANTAR FASCIITIS 04/09/2010  . Hypothyroidism 12/08/2009  . NEOPLASM OF UNCERTAIN BEHAVIOR OF SKIN 03/10/2009  . OSTEOARTHRITIS  02/14/2009  . HYPERLIPIDEMIA 02/08/2009  . HEARING DEFICIT 02/08/2009  . Essential hypertension 02/08/2009  . ALLERGIC RHINITIS 02/08/2009  . GERD 02/08/2009  . PROTEINURIA 02/08/2009  . DIVERTICULITIS, HX OF 02/08/2009    Hutton Pellicane PT, OCS 07/19/2015, 10:04 AM  [facility] [place], [place], [address] Phone: [phone]   Fax:  [phone]

## 2015-07-20 ENCOUNTER — Ambulatory Visit: Payer: 59 | Admitting: Rehabilitation

## 2015-07-20 DIAGNOSIS — M25512 Pain in left shoulder: Secondary | ICD-10-CM

## 2015-07-20 DIAGNOSIS — M25612 Stiffness of left shoulder, not elsewhere classified: Secondary | ICD-10-CM

## 2015-07-20 DIAGNOSIS — R29898 Other symptoms and signs involving the musculoskeletal system: Secondary | ICD-10-CM

## 2015-07-20 NOTE — Therapy (Signed)
[facility] [address]  [place], [place], [address] Phone: [phone]   Fax:  [phone]  Physical Therapy Treatment  Patient Details  Name: Zachary [name] MRN: [id number] Date of Birth: 1951/08/10 Referring Provider:  Ninetta Lights, MD  Encounter Date: 07/20/2015      PT End of Session - 07/20/15 1610    Visit Number 2   Number of Visits 20   Date for PT Re-Evaluation 09/27/15   PT Start Time 0175   PT Stop Time 1700   PT Time Calculation (min) 46 min      Past Medical History  Diagnosis Date  . Hyperlipidemia   . Hypertension   . Insomnia   . Hypothyroidism   . GERD (gastroesophageal reflux disease)   . Arthritis   . Hemorrhoids   . Hearing problem     hearing deficit  . Diverticulitis   . Wears glasses   . Wears hearing aid     both ears  . Cancer 2015    positive prostate cancer bx  . Sleep apnea     uses a cpap    Past Surgical History  Procedure Laterality Date  . Hemorrhoid surgery  08/2006  . Back surgery  1978    herniated disksurgery  . Tendon repair  June 06, 2011    right elbow, Dr. Percell Miller  . Cardiac catheterization  08/01/2010  . Colonoscopy    . Shoulder arthroscopy with subacromial decompression, rotator cuff repair and bicep tendon repair Right 03/10/2014    Procedure: RIGHT SHOULDER ARTHROSCOPY WITH SUBACROMIAL DECOMPRESSION, PARTIAL ACROMIOPLASTY WITH CORACOAROMIAL LABRUM DEBRIDEMENT RELEASE DISTAL CLAVICULECTOMY,  ROTATOR CUFF REPAIR AND EXTENSIVE DEBRIDEMENT;  Surgeon: Ninetta Lights, MD;  Location: [facility];  Service: Orthopedics;  Laterality: Right;  . Exam under anesthesia with manipulation of shoulder Right 09/15/2014    Procedure: RIGHT SHOULDER MANIPULATION UNDER ANESTHESIA;  Surgeon: Ninetta Lights, MD;  Location: [facility];  Service: Orthopedics;  Laterality: Right;  . Shoulder arthroscopy with rotator cuff repair and  subacromial decompression Left 07/07/2015    Procedure: LEFT SHOULDER SCOPE DEBRIDEMENT, SUBACROMIAL DECOMPRESSION, DISTAL CLAVICULECTOMY, ROTATOR CUFF REPAIR  ;  Surgeon: Kathryne Hitch, MD;  Location: [facility];  Service: Orthopedics;  Laterality: Left;  ANESTHESIA: GENERAL, PRE/POST OP SCALENE  . Shoulder arthroscopy with distal clavicle resection Left 07/07/2015    Procedure: SHOULDER ARTHROSCOPY WITH DISTAL CLAVICLE RESECTION;  Surgeon: Kathryne Hitch, MD;  Location: [facility];  Service: Orthopedics;  Laterality: Left;    There were no vitals filed for this visit.  Visit Diagnosis:  Left shoulder pain  Shoulder weakness  Shoulder stiffness, left      Subjective Assessment - 07/20/15 1614    Subjective Felt ok after yesterday and has been going his exercises. Will be 2 weeks out of surgery tomorrow.    Currently in Pain? Yes   Pain Score --  4-5/10   Pain Location Shoulder   Pain Orientation Left      TODAY'S TREATMENT Manual - L Shoulder Grade 2 AP and Caudal glides with mobes/stretch into ER, Flex, and ABD to tolerance or per protocol Gentle distraction with occilations to decrease guarding.  PROM to tolerance all directions to protocol limits (ER= 30 degrees at 30 degrees abduction, flexion not formally measured but appeared over 100 degrees) STM L UT  Vasopneumatic Compression - L Shoulder, Low Pressure, 37dg, 15'  PT Short Term Goals - 07/20/15 1615    PT SHORT TERM GOAL #1   Title L shoulder PROM Flexion to 160, ABD to 150, and ER to 75 at 90 ABD by 08/18/15   Status On-going           PT Long Term Goals - 07/20/15 1615    PT LONG TERM GOAL #1   Title L Shoulder AROM WFL all planes without limit by pain by 09/27/15   Status On-going   PT LONG TERM GOAL #2   Title L Shoulder MMT 4/5 or better all planes by 09/27/15   Status On-going   PT LONG TERM GOAL #3   Title pt able to return to all ADLs, chores, and work duties  without limitation by shoulder pain, LOM, or weakness by 09/27/15   Status On-going               Plan - 07/20/15 1646    Clinical Impression Statement Improved ROM noted today compared to yesterday, ER to 30 degrees and flexion appeared to be over 100. Still very tight and tender into flexion and ER but pt able to decrease guarding following mobilizations.    PT Next Visit Plan manual, PROM, may instruct in self mobes if guarding decreases and pt seems safe to perform   Consulted and Agree with Plan of Care Patient        Problem List Patient Active Problem List   Diagnosis Date Noted  . Complete rotator cuff tear of left shoulder 07/05/2015  . Tennis elbow 05/30/2014  . Rotator cuff (capsule) sprain 03/10/2014  . Right shoulder pain 02/11/2014  . Memory difficulty 12/18/2012  . Gynecomastia, male 07/30/2012  . OSA (obstructive sleep apnea) 04/23/2011  . EXTERNAL HEMORRHOIDS 02/12/2011  . Prostate cancer 11/12/2010  . PLANTAR FASCIITIS 04/09/2010  . Hypothyroidism 12/08/2009  . NEOPLASM OF UNCERTAIN BEHAVIOR OF SKIN 03/10/2009  . OSTEOARTHRITIS 02/14/2009  . HYPERLIPIDEMIA 02/08/2009  . HEARING DEFICIT 02/08/2009  . Essential hypertension 02/08/2009  . ALLERGIC RHINITIS 02/08/2009  . GERD 02/08/2009  . PROTEINURIA 02/08/2009  . DIVERTICULITIS, HX OF 02/08/2009    Barbette Hair, PTA 07/20/2015, 4:48 PM  [facility] [address]  [place], [place], [address] Phone: [phone]   Fax:  [phone]

## 2015-07-25 ENCOUNTER — Ambulatory Visit: Payer: 59 | Attending: Orthopedic Surgery | Admitting: Physical Therapy

## 2015-07-25 DIAGNOSIS — M25512 Pain in left shoulder: Secondary | ICD-10-CM | POA: Diagnosis not present

## 2015-07-25 DIAGNOSIS — R29898 Other symptoms and signs involving the musculoskeletal system: Secondary | ICD-10-CM | POA: Insufficient documentation

## 2015-07-25 DIAGNOSIS — M25612 Stiffness of left shoulder, not elsewhere classified: Secondary | ICD-10-CM | POA: Insufficient documentation

## 2015-07-25 NOTE — Therapy (Signed)
[facility] [address]  [place], [place], [address] Phone: [phone]   Fax:  [phone]  Physical Therapy Treatment  Patient Details  Name: Zachary [name] MRN: [id number] Date of Birth: 23-Feb-1951 Referring Provider:  Ninetta Lights, MD  Encounter Date: 07/25/2015      PT End of Session - 07/25/15 1625    Visit Number 3   Number of Visits 20   Date for PT Re-Evaluation 09/27/15   PT Start Time 4562   PT Stop Time 1705   PT Time Calculation (min) 44 min      Past Medical History  Diagnosis Date  . Hyperlipidemia   . Hypertension   . Insomnia   . Hypothyroidism   . GERD (gastroesophageal reflux disease)   . Arthritis   . Hemorrhoids   . Hearing problem     hearing deficit  . Diverticulitis   . Wears glasses   . Wears hearing aid     both ears  . Cancer 2015    positive prostate cancer bx  . Sleep apnea     uses a cpap    Past Surgical History  Procedure Laterality Date  . Hemorrhoid surgery  08/2006  . Back surgery  1978    herniated disksurgery  . Tendon repair  June 06, 2011    right elbow, Dr. Percell Miller  . Cardiac catheterization  08/01/2010  . Colonoscopy    . Shoulder arthroscopy with subacromial decompression, rotator cuff repair and bicep tendon repair Right 03/10/2014    Procedure: RIGHT SHOULDER ARTHROSCOPY WITH SUBACROMIAL DECOMPRESSION, PARTIAL ACROMIOPLASTY WITH CORACOAROMIAL LABRUM DEBRIDEMENT RELEASE DISTAL CLAVICULECTOMY,  ROTATOR CUFF REPAIR AND EXTENSIVE DEBRIDEMENT;  Surgeon: Ninetta Lights, MD;  Location: [facility];  Service: Orthopedics;  Laterality: Right;  . Exam under anesthesia with manipulation of shoulder Right 09/15/2014    Procedure: RIGHT SHOULDER MANIPULATION UNDER ANESTHESIA;  Surgeon: Ninetta Lights, MD;  Location: [facility];  Service: Orthopedics;  Laterality: Right;  . Shoulder arthroscopy with rotator cuff repair and  subacromial decompression Left 07/07/2015    Procedure: LEFT SHOULDER SCOPE DEBRIDEMENT, SUBACROMIAL DECOMPRESSION, DISTAL CLAVICULECTOMY, ROTATOR CUFF REPAIR  ;  Surgeon: Kathryne Hitch, MD;  Location: [facility];  Service: Orthopedics;  Laterality: Left;  ANESTHESIA: GENERAL, PRE/POST OP SCALENE  . Shoulder arthroscopy with distal clavicle resection Left 07/07/2015    Procedure: SHOULDER ARTHROSCOPY WITH DISTAL CLAVICLE RESECTION;  Surgeon: Kathryne Hitch, MD;  Location: [facility];  Service: Orthopedics;  Laterality: Left;    There were no vitals filed for this visit.  Visit Diagnosis:  Left shoulder pain  Shoulder weakness  Shoulder stiffness, left      Subjective Assessment - 07/25/15 1623    Subjective increased pain lately with increased activity (HEP - pendulums, shoulder rolls).  States is sleeping well.   Currently in Pain? Yes   Pain Score 7    Pain Location Shoulder   Pain Orientation Left              TODAY'S TREATMENT Manual - L Shoulder Grade 2 AP and Caudal glides with mobes/stretch into ER, Flex, and ABD to tolerance or per protocol Gentle distraction with occilations to decrease guarding.  R Side-Lying L scapular mobes and STM/TRP L teres group and L UT.  Vasopneumatic Compression - L Shoulder, Low Pressure, 38dg, 15'  PT Short Term Goals - 07/20/15 1615    PT SHORT TERM GOAL #1   Title L shoulder PROM Flexion to 160, ABD to 150, and ER to 75 at 90 ABD by 08/18/15   Status On-going           PT Long Term Goals - 07/20/15 1615    PT LONG TERM GOAL #1   Title L Shoulder AROM WFL all planes without limit by pain by 09/27/15   Status On-going   PT LONG TERM GOAL #2   Title L Shoulder MMT 4/5 or better all planes by 09/27/15   Status On-going   PT LONG TERM GOAL #3   Title pt able to return to all ADLs, chores, and work duties without limitation by shoulder pain, LOM, or weakness by  09/27/15   Status On-going               Plan - 07/25/15 1650    Clinical Impression Statement good progress with PROM, will be 3 weeks s/p RCR this Friday.   PT Next Visit Plan per protocol   Consulted and Agree with Plan of Care Patient        Problem List Patient Active Problem List   Diagnosis Date Noted  . Complete rotator cuff tear of left shoulder 07/05/2015  . Tennis elbow 05/30/2014  . Rotator cuff (capsule) sprain 03/10/2014  . Right shoulder pain 02/11/2014  . Memory difficulty 12/18/2012  . Gynecomastia, male 07/30/2012  . OSA (obstructive sleep apnea) 04/23/2011  . EXTERNAL HEMORRHOIDS 02/12/2011  . Prostate cancer 11/12/2010  . PLANTAR FASCIITIS 04/09/2010  . Hypothyroidism 12/08/2009  . NEOPLASM OF UNCERTAIN BEHAVIOR OF SKIN 03/10/2009  . OSTEOARTHRITIS 02/14/2009  . HYPERLIPIDEMIA 02/08/2009  . HEARING DEFICIT 02/08/2009  . Essential hypertension 02/08/2009  . ALLERGIC RHINITIS 02/08/2009  . GERD 02/08/2009  . PROTEINURIA 02/08/2009  . DIVERTICULITIS, HX OF 02/08/2009    Shayleen Eppinger PT, OCS 07/25/2015, 5:07 PM  [facility] [address]  [place], [place], [address] Phone: [phone]   Fax:  [phone]

## 2015-07-27 ENCOUNTER — Ambulatory Visit: Payer: 59 | Admitting: Physical Therapy

## 2015-07-27 DIAGNOSIS — M25612 Stiffness of left shoulder, not elsewhere classified: Secondary | ICD-10-CM

## 2015-07-27 DIAGNOSIS — M25512 Pain in left shoulder: Secondary | ICD-10-CM | POA: Diagnosis not present

## 2015-07-27 DIAGNOSIS — R29898 Other symptoms and signs involving the musculoskeletal system: Secondary | ICD-10-CM

## 2015-07-27 NOTE — Therapy (Signed)
[facility] [address]  [place], [place], [address] Phone: [phone]   Fax:  [phone]  Physical Therapy Treatment  Patient Details  Name: Zachary [name] MRN: [id number] Date of Birth: 1951-12-02 Referring Provider:  Ninetta Lights, MD  Encounter Date: 07/27/2015      PT End of Session - 07/27/15 1538    Visit Number 4   Number of Visits 20   Date for PT Re-Evaluation 09/27/15   PT Start Time 1536   PT Stop Time 1623   PT Time Calculation (min) 47 min      Past Medical History  Diagnosis Date  . Hyperlipidemia   . Hypertension   . Insomnia   . Hypothyroidism   . GERD (gastroesophageal reflux disease)   . Arthritis   . Hemorrhoids   . Hearing problem     hearing deficit  . Diverticulitis   . Wears glasses   . Wears hearing aid     both ears  . Cancer 2015    positive prostate cancer bx  . Sleep apnea     uses a cpap    Past Surgical History  Procedure Laterality Date  . Hemorrhoid surgery  08/2006  . Back surgery  1978    herniated disksurgery  . Tendon repair  June 06, 2011    right elbow, Dr. Percell Miller  . Cardiac catheterization  08/01/2010  . Colonoscopy    . Shoulder arthroscopy with subacromial decompression, rotator cuff repair and bicep tendon repair Right 03/10/2014    Procedure: RIGHT SHOULDER ARTHROSCOPY WITH SUBACROMIAL DECOMPRESSION, PARTIAL ACROMIOPLASTY WITH CORACOAROMIAL LABRUM DEBRIDEMENT RELEASE DISTAL CLAVICULECTOMY,  ROTATOR CUFF REPAIR AND EXTENSIVE DEBRIDEMENT;  Surgeon: Ninetta Lights, MD;  Location: [facility];  Service: Orthopedics;  Laterality: Right;  . Exam under anesthesia with manipulation of shoulder Right 09/15/2014    Procedure: RIGHT SHOULDER MANIPULATION UNDER ANESTHESIA;  Surgeon: Ninetta Lights, MD;  Location: [facility];  Service: Orthopedics;  Laterality: Right;  . Shoulder arthroscopy with rotator cuff repair and  subacromial decompression Left 07/07/2015    Procedure: LEFT SHOULDER SCOPE DEBRIDEMENT, SUBACROMIAL DECOMPRESSION, DISTAL CLAVICULECTOMY, ROTATOR CUFF REPAIR  ;  Surgeon: Kathryne Hitch, MD;  Location: [facility];  Service: Orthopedics;  Laterality: Left;  ANESTHESIA: GENERAL, PRE/POST OP SCALENE  . Shoulder arthroscopy with distal clavicle resection Left 07/07/2015    Procedure: SHOULDER ARTHROSCOPY WITH DISTAL CLAVICLE RESECTION;  Surgeon: Kathryne Hitch, MD;  Location: [facility];  Service: Orthopedics;  Laterality: Left;    There were no vitals filed for this visit.  Visit Diagnosis:  Left shoulder pain  Shoulder weakness  Shoulder stiffness, left      Subjective Assessment - 07/27/15 1537    Subjective more sore past 2 days but states is still sleeping well.   Currently in Pain? Yes   Pain Score 8    Pain Location Shoulder   Pain Orientation Left             TODAY'S TREATMENT Manual - L Shoulder Grade 2 AP and Caudal glides with mobes/stretch into ER and ABD to tolerance (progressed PROM to wk 3 values today and was able to achieve ~ 80 degree ABD without pain, 30 degree ER at 45 ABD without pain) Flexion still most painful.  Performed most Flexion mobes in R side-lying today to help decrease guarding.  Also performed STM/TPR to teres group due to high tone noted  during flexion mobes.  TherEx - Hooklying Scap retraction isometric 8x5" (advised to perform with HEP)  Vasopneumatic Compression - L Shoulder, Low Pressure, 38dg, 15'                    PT Education - 07/27/15 1610    Education provided Yes   Education Details added scap retraction isometric to [employer]) Educated Patient   Methods Explanation   Comprehension Verbalized understanding;Returned demonstration          PT Short Term Goals - 07/27/15 1612    PT SHORT TERM GOAL #1   Title L shoulder PROM Flexion to 160, ABD to 150, and ER to 75 at 90 ABD by  08/18/15   Status On-going           PT Long Term Goals - 07/27/15 1612    PT LONG TERM GOAL #1   Title L Shoulder AROM WFL all planes without limit by pain by 09/27/15   Status On-going   PT LONG TERM GOAL #2   Title L Shoulder MMT 4/5 or better all planes by 09/27/15   Status On-going   PT LONG TERM GOAL #3   Title pt able to return to all ADLs, chores, and work duties without limitation by shoulder pain, LOM, or weakness by 09/27/15   Status On-going               Plan - 07/27/15 1610    Clinical Impression Statement progressing well per protocol.  some high tone in teres group causing early scapular rotation during flexion mobes.  manual to help decrease this only mildly effective today.   PT Next Visit Plan pt will be in 3 wk portion of protocol next visit, progress to [facility] per protocol.   Consulted and Agree with Plan of Care Patient        Problem List Patient Active Problem List   Diagnosis Date Noted  . Complete rotator cuff tear of left shoulder 07/05/2015  . Tennis elbow 05/30/2014  . Rotator cuff (capsule) sprain 03/10/2014  . Right shoulder pain 02/11/2014  . Memory difficulty 12/18/2012  . Gynecomastia, male 07/30/2012  . OSA (obstructive sleep apnea) 04/23/2011  . EXTERNAL HEMORRHOIDS 02/12/2011  . Prostate cancer 11/12/2010  . PLANTAR FASCIITIS 04/09/2010  . Hypothyroidism 12/08/2009  . NEOPLASM OF UNCERTAIN BEHAVIOR OF SKIN 03/10/2009  . OSTEOARTHRITIS 02/14/2009  . HYPERLIPIDEMIA 02/08/2009  . HEARING DEFICIT 02/08/2009  . Essential hypertension 02/08/2009  . ALLERGIC RHINITIS 02/08/2009  . GERD 02/08/2009  . PROTEINURIA 02/08/2009  . DIVERTICULITIS, HX OF 02/08/2009    Sandeep Delagarza PT, OCS 07/27/2015, 4:29 PM  [facility] [address]  [place], [place], [address] Phone: [phone]   Fax:  [phone]

## 2015-07-28 ENCOUNTER — Telehealth: Payer: Self-pay

## 2015-07-31 ENCOUNTER — Ambulatory Visit: Payer: 59 | Admitting: Rehabilitation

## 2015-07-31 ENCOUNTER — Encounter: Payer: Self-pay | Admitting: Rehabilitation

## 2015-07-31 DIAGNOSIS — M25512 Pain in left shoulder: Secondary | ICD-10-CM | POA: Diagnosis not present

## 2015-07-31 DIAGNOSIS — M25612 Stiffness of left shoulder, not elsewhere classified: Secondary | ICD-10-CM

## 2015-07-31 DIAGNOSIS — R29898 Other symptoms and signs involving the musculoskeletal system: Secondary | ICD-10-CM

## 2015-07-31 NOTE — Therapy (Signed)
[facility] [address]  [place], [place], [address] Phone: [phone]   Fax:  [phone]  Physical Therapy Treatment  Patient Details  Name: Zachary [name] MRN: [id number] Date of Birth: 07/29/1951 Referring Provider:  Ninetta Lights, MD  Encounter Date: 07/31/2015      PT End of Session - 07/31/15 1657    Visit Number 5   Number of Visits 20   Date for PT Re-Evaluation 09/27/15   PT Start Time 0240   PT Stop Time 1712   PT Time Calculation (min) 57 min   Activity Tolerance Patient tolerated treatment well      Past Medical History  Diagnosis Date  . Hyperlipidemia   . Hypertension   . Insomnia   . Hypothyroidism   . GERD (gastroesophageal reflux disease)   . Arthritis   . Hemorrhoids   . Hearing problem     hearing deficit  . Diverticulitis   . Wears glasses   . Wears hearing aid     both ears  . Cancer 2015    positive prostate cancer bx  . Sleep apnea     uses a cpap    Past Surgical History  Procedure Laterality Date  . Hemorrhoid surgery  08/2006  . Back surgery  1978    herniated disksurgery  . Tendon repair  June 06, 2011    right elbow, Dr. Percell Miller  . Cardiac catheterization  08/01/2010  . Colonoscopy    . Shoulder arthroscopy with subacromial decompression, rotator cuff repair and bicep tendon repair Right 03/10/2014    Procedure: RIGHT SHOULDER ARTHROSCOPY WITH SUBACROMIAL DECOMPRESSION, PARTIAL ACROMIOPLASTY WITH CORACOAROMIAL LABRUM DEBRIDEMENT RELEASE DISTAL CLAVICULECTOMY,  ROTATOR CUFF REPAIR AND EXTENSIVE DEBRIDEMENT;  Surgeon: Ninetta Lights, MD;  Location: [facility];  Service: Orthopedics;  Laterality: Right;  . Exam under anesthesia with manipulation of shoulder Right 09/15/2014    Procedure: RIGHT SHOULDER MANIPULATION UNDER ANESTHESIA;  Surgeon: Ninetta Lights, MD;  Location: [facility];  Service: Orthopedics;  Laterality: Right;  .  Shoulder arthroscopy with rotator cuff repair and subacromial decompression Left 07/07/2015    Procedure: LEFT SHOULDER SCOPE DEBRIDEMENT, SUBACROMIAL DECOMPRESSION, DISTAL CLAVICULECTOMY, ROTATOR CUFF REPAIR  ;  Surgeon: Kathryne Hitch, MD;  Location: [facility];  Service: Orthopedics;  Laterality: Left;  ANESTHESIA: GENERAL, PRE/POST OP SCALENE  . Shoulder arthroscopy with distal clavicle resection Left 07/07/2015    Procedure: SHOULDER ARTHROSCOPY WITH DISTAL CLAVICLE RESECTION;  Surgeon: Kathryne Hitch, MD;  Location: [facility];  Service: Orthopedics;  Laterality: Left;    There were no vitals filed for this visit.  Visit Diagnosis:  Left shoulder pain  Shoulder weakness  Shoulder stiffness, left      Subjective Assessment - 07/31/15 1620    Subjective tender but not bad   Currently in Pain? Yes   Pain Score 7    Pain Location Shoulder   Pain Orientation Left      L shoulder PROM with sig cueing to decrease guarding.  Flexion, ABD, ER, and IR per protocol for week 3.  Decreasing guarding after work on PROM.  [place] AP glides grade II 3x30"  Supine isometrics extension and IR 10"x10  Initiated cane flexion and ER per protocol x 10 in clinic added to HEP  Standing towel flexion and c/cc circles at counter  Standing flexion pulley x 8  Vaso x 15' medium to L shoulder  PT Short Term Goals - 07/27/15 1612    PT SHORT TERM GOAL #1   Title L shoulder PROM Flexion to 160, ABD to 150, and ER to 75 at 90 ABD by 08/18/15   Status On-going           PT Long Term Goals - 07/27/15 1612    PT LONG TERM GOAL #1   Title L Shoulder AROM WFL all planes without limit by pain by 09/27/15   Status On-going   PT LONG TERM GOAL #2   Title L Shoulder MMT 4/5 or better all planes by 09/27/15   Status On-going   PT LONG TERM GOAL #3   Title pt able to return to all ADLs, chores, and work duties without limitation  by shoulder pain, LOM, or weakness by 09/27/15   Status On-going               Plan - 07/31/15 1657    Clinical Impression Statement tolerated well.  guarding early in session but less with PROM work.  less high tone present today.     PT Next Visit Plan week 3 until 8/12        Problem List Patient Active Problem List   Diagnosis Date Noted  . Complete rotator cuff tear of left shoulder 07/05/2015  . Tennis elbow 05/30/2014  . Rotator cuff (capsule) sprain 03/10/2014  . Right shoulder pain 02/11/2014  . Memory difficulty 12/18/2012  . Gynecomastia, male 07/30/2012  . OSA (obstructive sleep apnea) 04/23/2011  . EXTERNAL HEMORRHOIDS 02/12/2011  . Prostate cancer 11/12/2010  . PLANTAR FASCIITIS 04/09/2010  . Hypothyroidism 12/08/2009  . NEOPLASM OF UNCERTAIN BEHAVIOR OF SKIN 03/10/2009  . OSTEOARTHRITIS 02/14/2009  . HYPERLIPIDEMIA 02/08/2009  . HEARING DEFICIT 02/08/2009  . Essential hypertension 02/08/2009  . ALLERGIC RHINITIS 02/08/2009  . GERD 02/08/2009  . PROTEINURIA 02/08/2009  . DIVERTICULITIS, HX OF 02/08/2009    Stark Bray, DPT, CMP 07/31/2015, 5:00 PM  [facility] [address]  [place], [place], [address] Phone: [phone]   Fax:  [phone]

## 2015-08-01 ENCOUNTER — Ambulatory Visit (INDEPENDENT_AMBULATORY_CARE_PROVIDER_SITE_OTHER): Payer: 59 | Admitting: Family Medicine

## 2015-08-01 ENCOUNTER — Encounter: Payer: Self-pay | Admitting: Family Medicine

## 2015-08-01 VITALS — BP 120/75 | HR 76 | Wt 195.0 lb

## 2015-08-01 DIAGNOSIS — I10 Essential (primary) hypertension: Secondary | ICD-10-CM

## 2015-08-01 DIAGNOSIS — C61 Malignant neoplasm of prostate: Secondary | ICD-10-CM

## 2015-08-01 DIAGNOSIS — Z1159 Encounter for screening for other viral diseases: Secondary | ICD-10-CM | POA: Diagnosis not present

## 2015-08-01 DIAGNOSIS — E039 Hypothyroidism, unspecified: Secondary | ICD-10-CM

## 2015-08-01 MED ORDER — ZOSTER VACCINE LIVE 19400 UNT/0.65ML ~~LOC~~ SOLR: 0.6500 mL | Freq: Once | SUBCUTANEOUS | Status: DC

## 2015-08-01 MED ORDER — RANITIDINE HCL 150 MG PO TABS: ORAL_TABLET | ORAL | Status: DC

## 2015-08-01 NOTE — Progress Notes (Signed)
CC: Zachary [name] is a 64 y.o. male is here for 6 month f/u   Subjective: HPI:   follow-up hypothyroidism: Continues on 100 MCG of levothyroxine daily. Denies any unintentional weight loss or gain. No hair or skin changes. Denies any mood disturbance.  Follow-up essential hypertension: No outside blood pressures report. Continues on lisinopril on a daily basis. Denies chest pain shortness of breath orthopnea nor peripheral edema. He is currently exercising with physical therapy most days of the week.  Follow-up prostate cancer: He specifically requesting a PSA today. He denies any new urinary complaints. He sees his urologist every 6 months, he plans on seeing him in the end of this month. There's been no dysuria, pelvic pain, low back pain, nor urinary hesitancy.  He would like to know if he can have the shingles vaccine and whether or not he should be screened for hepatitis C.  He is suspicious that omeprazole is the reason why he has had multiple orthopedic surgeries. He read somewhere that this medication can cause muscle weakness.he's been taking this medication for decades and if he stops it he gets intolerable reflux within a day or 2.  Review Of Systems Outlined In HPI  Past Medical History  Diagnosis Date  . Hyperlipidemia   . Hypertension   . Insomnia   . Hypothyroidism   . GERD (gastroesophageal reflux disease)   . Arthritis   . Hemorrhoids   . Hearing problem     hearing deficit  . Diverticulitis   . Wears glasses   . Wears hearing aid     both ears  . Cancer 2015    positive prostate cancer bx  . Sleep apnea     uses a cpap    Past Surgical History  Procedure Laterality Date  . Hemorrhoid surgery  08/2006  . Back surgery  1978    herniated disksurgery  . Tendon repair  June 06, 2011    right elbow, Dr. Percell [name]  . Cardiac catheterization  08/01/2010  . Colonoscopy    . Shoulder arthroscopy with subacromial decompression, rotator cuff repair and bicep  tendon repair Right 03/10/2014    Procedure: RIGHT SHOULDER ARTHROSCOPY WITH SUBACROMIAL DECOMPRESSION, PARTIAL ACROMIOPLASTY WITH CORACOAROMIAL LABRUM DEBRIDEMENT RELEASE DISTAL CLAVICULECTOMY,  ROTATOR CUFF REPAIR AND EXTENSIVE DEBRIDEMENT;  Surgeon: Zachary Lights, MD;  Location: [facility];  Service: Orthopedics;  Laterality: Right;  . Exam under anesthesia with manipulation of shoulder Right 09/15/2014    Procedure: RIGHT SHOULDER MANIPULATION UNDER ANESTHESIA;  Surgeon: Zachary Lights, MD;  Location: [name];  Service: Orthopedics;  Laterality: Right;  . Shoulder arthroscopy with rotator cuff repair and subacromial decompression Left 07/07/2015    Procedure: LEFT SHOULDER SCOPE DEBRIDEMENT, SUBACROMIAL DECOMPRESSION, DISTAL CLAVICULECTOMY, ROTATOR CUFF REPAIR  ;  Surgeon: Zachary Hitch, MD;  Location: [facility];  Service: Orthopedics;  Laterality: Left;  ANESTHESIA: GENERAL, PRE/POST OP SCALENE  . Shoulder arthroscopy with distal clavicle resection Left 07/07/2015    Procedure: SHOULDER ARTHROSCOPY WITH DISTAL CLAVICLE RESECTION;  Surgeon: Zachary Hitch, MD;  Location: [facility];  Service: Orthopedics;  Laterality: Left;   Family History  Problem Relation Age of Onset  . Heart disease Father   . Breast cancer Mother   . Cancer Brother   . Alcohol abuse Other   . Arthritis Other   . Cancer Other     Breast, Prostate  . Coronary artery disease Other   . Irritable bowel syndrome Other   .  Cystic fibrosis Other     History   Social History  . Marital Status: Married    Spouse Name: Zachary [name]  . Number of Children: 2  . 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 Smoking status: Never Smoker   . Smokeless tobacco: Never Used  . Alcohol Use: Yes     Comment: occasional  . Drug Use: No  . Sexual Activity: Not on file   Other Topics  Concern  . Not on file   Social History Narrative   Previously Chartered loss adjuster [employer]     Objective: BP 120/75 mmHg  Pulse 76  Wt 195 lb (88.451 kg)  General: Alert and Oriented, No Acute Distress HEENT: Pupils equal, round, reactive to light. Conjunctivae clear.  Moist mucous membranes pharynx unremarkable Lungs: Clear to auscultation bilaterally, no wheezing/ronchi/rales.  Comfortable work of breathing. Good air movement. Cardiac: Regular rate and rhythm. Normal S1/S2.  No murmurs, rubs, nor gallops.   Extremities: No peripheral edema.  Strong peripheral pulses.  Mental Status: No depression, anxiety, nor agitation. Skin: Warm and dry.  Assessment & Plan: Zachary [name] was seen today for 6 month f/u.  Diagnoses and all orders for this visit:  Hypothyroidism, unspecified hypothyroidism type Orders: -     TSH  Essential hypertension Orders: -     LDL cholesterol, direct  Need for hepatitis C screening test Orders: -     Hepatitis C antibody  Prostate cancer Orders: -     PSA  Other orders -     ranitidine (ZANTAC) 150 MG tablet; One by mouth twice a day to prevent gastric reflux symptoms. -     zoster vaccine live, PF, (ZOSTAVAX) 16109 UNT/0.65ML injection; Inject 19,400 Units into the skin once.   Hypothyroidism:clinically controlled however due for repeat TSH continue levothyroxine pending results Essential hypertension: Controlled continue lisinopril, due for LDL check History of prostate cancer: I let him know that his urologist will probably be ordering a PSA however if he like to have it sooner rather than later I be happy to order it today. GERD: Controlled on omeprazole, discussed my low suspicion that his orthopedic surgeries are a result of years of taking omeprazole, he would like to try something different therefore recommend ranitidine He is in the age range where he is due for zoster vaccine, [facility]  [place], if it is affordable he understands to pick this up and keep it frozen until he has a nurse visit here for the injection.    Return in about 6 months (around 02/01/2016).

## 2015-08-02 LAB — LDL CHOLESTEROL, DIRECT: Direct LDL: 93 mg/dL (ref <130)

## 2015-08-02 LAB — HEPATITIS C ANTIBODY: HCV AB: NEGATIVE

## 2015-08-02 LAB — PSA: PSA: 11.47 ng/mL — ABNORMAL HIGH (ref <=4.00)

## 2015-08-02 LAB — TSH: TSH: 3.299 u[IU]/mL (ref 0.350–4.500)

## 2015-08-03 ENCOUNTER — Ambulatory Visit: Payer: 59 | Admitting: Physical Therapy

## 2015-08-03 DIAGNOSIS — M25612 Stiffness of left shoulder, not elsewhere classified: Secondary | ICD-10-CM

## 2015-08-03 DIAGNOSIS — R29898 Other symptoms and signs involving the musculoskeletal system: Secondary | ICD-10-CM

## 2015-08-03 DIAGNOSIS — M25512 Pain in left shoulder: Secondary | ICD-10-CM

## 2015-08-03 NOTE — Therapy (Signed)
[facility] [address]  [place], [place], [address] Phone: [phone]   Fax:  [phone]  Physical Therapy Treatment  Patient Details  Name: Zachary [name] MRN: [id number] Date of Birth: 1951/02/08 Referring Provider:  Ninetta Lights, MD  Encounter Date: 08/03/2015      PT End of Session - 08/03/15 0853    Visit Number 6   Number of Visits 20   Date for PT Re-Evaluation 09/27/15   PT Start Time 0852   PT Stop Time 0933   PT Time Calculation (min) 41 min      Past Medical History  Diagnosis Date  . Hyperlipidemia   . Hypertension   . Insomnia   . Hypothyroidism   . GERD (gastroesophageal reflux disease)   . Arthritis   . Hemorrhoids   . Hearing problem     hearing deficit  . Diverticulitis   . Wears glasses   . Wears hearing aid     both ears  . Cancer 2015    positive prostate cancer bx  . Sleep apnea     uses a cpap    Past Surgical History  Procedure Laterality Date  . Hemorrhoid surgery  08/2006  . Back surgery  1978    herniated disksurgery  . Tendon repair  June 06, 2011    right elbow, Dr. Percell Miller  . Cardiac catheterization  08/01/2010  . Colonoscopy    . Shoulder arthroscopy with subacromial decompression, rotator cuff repair and bicep tendon repair Right 03/10/2014    Procedure: RIGHT SHOULDER ARTHROSCOPY WITH SUBACROMIAL DECOMPRESSION, PARTIAL ACROMIOPLASTY WITH CORACOAROMIAL LABRUM DEBRIDEMENT RELEASE DISTAL CLAVICULECTOMY,  ROTATOR CUFF REPAIR AND EXTENSIVE DEBRIDEMENT;  Surgeon: Ninetta Lights, MD;  Location: [place];  Service: Orthopedics;  Laterality: Right;  . Exam under anesthesia with manipulation of shoulder Right 09/15/2014    Procedure: RIGHT SHOULDER MANIPULATION UNDER ANESTHESIA;  Surgeon: Ninetta Lights, MD;  Location: [place];  Service: Orthopedics;  Laterality: Right;  . Shoulder arthroscopy with rotator cuff repair and  subacromial decompression Left 07/07/2015    Procedure: LEFT SHOULDER SCOPE DEBRIDEMENT, SUBACROMIAL DECOMPRESSION, DISTAL CLAVICULECTOMY, ROTATOR CUFF REPAIR  ;  Surgeon: Kathryne Hitch, MD;  Location: [place];  Service: Orthopedics;  Laterality: Left;  ANESTHESIA: GENERAL, PRE/POST OP SCALENE  . Shoulder arthroscopy with distal clavicle resection Left 07/07/2015    Procedure: SHOULDER ARTHROSCOPY WITH DISTAL CLAVICLE RESECTION;  Surgeon: Kathryne Hitch, MD;  Location: [place];  Service: Orthopedics;  Laterality: Left;    There were no vitals filed for this visit.  Visit Diagnosis:  Left shoulder pain  Shoulder weakness  Shoulder stiffness, left      Subjective Assessment - 08/03/15 0855    Subjective no new complaints, states has been performing wand exercises since last treatment   Currently in Pain? Yes   Pain Score 7    Pain Location Shoulder   Pain Orientation Left            [facility] PT Assessment - 08/03/15 0001    PROM   Left Shoulder Flexion 128 Degrees  limited by pain   Left Shoulder ABduction 90 Degrees  limited per protocol   Left Shoulder External Rotation 45 Degrees  at 45 ABD     TODAY'S TREATMENT TherEx - Pulleys Scaption 2', Horiz ABD/ADD at 90 Scaption 1', Flexion 1'  Manual - L GH AP and Caudal glides grade 2 with  PROM as able into Flexion and per protocol into ER and ABD.  Very good performance today with manual.  TherEx - Wand Pullover Flexion AAROM 15x Prone R Scap retraction with Shoulder Ext 15x NuStep UE + LE lvl 3, 3' (in lieu of aerodyne per protocol) Standing L Shoulder Ext and IR Isometrics against pillow in door frame 10x5" each                       PT Short Term Goals - 08/03/15 0941    PT SHORT TERM GOAL #1   Title L shoulder PROM Flexion to 160, ABD to 150, and ER to 75 at 90 ABD by 08/18/15   Status On-going           PT Long Term Goals - 08/03/15 0941    PT LONG TERM GOAL #1    Title L Shoulder AROM WFL all planes without limit by pain by 09/27/15   Status On-going   PT LONG TERM GOAL #2   Title L Shoulder MMT 4/5 or better all planes by 09/27/15   Status On-going   PT LONG TERM GOAL #3   Title pt able to return to all ADLs, chores, and work duties without limitation by shoulder pain, LOM, or weakness by 09/27/15   Status On-going               Plan - 08/03/15 0932    Clinical Impression Statement pt progressing right on schedule with RCR protocol with regard to PROM and with activity tolerance.  Performed very well today and declined ice/vaso following treatment stating felt good.   PT Next Visit Plan progress to wk 4 protocol next treatment   Consulted and Agree with Plan of Care Patient        Problem List Patient Active Problem List   Diagnosis Date Noted  . Complete rotator cuff tear of left shoulder 07/05/2015  . Tennis elbow 05/30/2014  . Rotator cuff (capsule) sprain 03/10/2014  . Right shoulder pain 02/11/2014  . Memory difficulty 12/18/2012  . Gynecomastia, male 07/30/2012  . OSA (obstructive sleep apnea) 04/23/2011  . EXTERNAL HEMORRHOIDS 02/12/2011  . Prostate cancer 11/12/2010  . PLANTAR FASCIITIS 04/09/2010  . Hypothyroidism 12/08/2009  . NEOPLASM OF UNCERTAIN BEHAVIOR OF SKIN 03/10/2009  . OSTEOARTHRITIS 02/14/2009  . HYPERLIPIDEMIA 02/08/2009  . HEARING DEFICIT 02/08/2009  . Essential hypertension 02/08/2009  . ALLERGIC RHINITIS 02/08/2009  . GERD 02/08/2009  . PROTEINURIA 02/08/2009  . DIVERTICULITIS, HX OF 02/08/2009    Duane Trias PT, OCS 08/03/2015, 9:41 AM  [facility] [place], [place], [address] Phone: [phone]   Fax:  [phone]

## 2015-08-08 ENCOUNTER — Ambulatory Visit (INDEPENDENT_AMBULATORY_CARE_PROVIDER_SITE_OTHER): Payer: 59 | Admitting: Family Medicine

## 2015-08-08 ENCOUNTER — Ambulatory Visit: Payer: 59 | Admitting: Rehabilitation

## 2015-08-08 VITALS — BP 123/87 | HR 69 | Temp 97.6°F

## 2015-08-08 DIAGNOSIS — M25512 Pain in left shoulder: Secondary | ICD-10-CM | POA: Diagnosis not present

## 2015-08-08 DIAGNOSIS — Z23 Encounter for immunization: Secondary | ICD-10-CM

## 2015-08-08 DIAGNOSIS — M25612 Stiffness of left shoulder, not elsewhere classified: Secondary | ICD-10-CM

## 2015-08-08 DIAGNOSIS — R29898 Other symptoms and signs involving the musculoskeletal system: Secondary | ICD-10-CM

## 2015-08-08 NOTE — Progress Notes (Signed)
Patient came into clinic today for his Zoster vaccine. Pt supplied this vaccine via Rx that was sent to [facility] pharmacy. Pt brought in the immunization in a cooler filled with ice. This injections was administered in right deltoid, with no immediate complications. Pt advised to contact our office with any questions/concerns.

## 2015-08-08 NOTE — Therapy (Signed)
[facility] [address]  [place], [place], [address] Phone: [phone]   Fax:  [phone]  Physical Therapy Treatment  Patient Details  Name: Zachary [name] MRN: [id number] Date of Birth: 13-Dec-1951 Referring Provider:  Ninetta Lights, MD  Encounter Date: 08/08/2015      PT End of Session - 08/08/15 1529    Visit Number 7   Number of Visits 20   Date for PT Re-Evaluation 09/27/15   PT Start Time 6168   PT Stop Time 1607   PT Time Calculation (min) 41 min   Activity Tolerance Patient tolerated treatment well      Past Medical History  Diagnosis Date  . Hyperlipidemia   . Hypertension   . Insomnia   . Hypothyroidism   . GERD (gastroesophageal reflux disease)   . Arthritis   . Hemorrhoids   . Hearing problem     hearing deficit  . Diverticulitis   . Wears glasses   . Wears hearing aid     both ears  . Cancer 2015    positive prostate cancer bx  . Sleep apnea     uses a cpap    Past Surgical History  Procedure Laterality Date  . Hemorrhoid surgery  08/2006  . Back surgery  1978    herniated disksurgery  . Tendon repair  June 06, 2011    right elbow, Dr. Percell Miller  . Cardiac catheterization  08/01/2010  . Colonoscopy    . Shoulder arthroscopy with subacromial decompression, rotator cuff repair and bicep tendon repair Right 03/10/2014    Procedure: RIGHT SHOULDER ARTHROSCOPY WITH SUBACROMIAL DECOMPRESSION, PARTIAL ACROMIOPLASTY WITH CORACOAROMIAL LABRUM DEBRIDEMENT RELEASE DISTAL CLAVICULECTOMY,  ROTATOR CUFF REPAIR AND EXTENSIVE DEBRIDEMENT;  Surgeon: Ninetta Lights, MD;  Location: [place];  Service: Orthopedics;  Laterality: Right;  . Exam under anesthesia with manipulation of shoulder Right 09/15/2014    Procedure: RIGHT SHOULDER MANIPULATION UNDER ANESTHESIA;  Surgeon: Ninetta Lights, MD;  Location: [place];  Service: Orthopedics;  Laterality: Right;  .  Shoulder arthroscopy with rotator cuff repair and subacromial decompression Left 07/07/2015    Procedure: LEFT SHOULDER SCOPE DEBRIDEMENT, SUBACROMIAL DECOMPRESSION, DISTAL CLAVICULECTOMY, ROTATOR CUFF REPAIR  ;  Surgeon: Kathryne Hitch, MD;  Location: [place];  Service: Orthopedics;  Laterality: Left;  ANESTHESIA: GENERAL, PRE/POST OP SCALENE  . Shoulder arthroscopy with distal clavicle resection Left 07/07/2015    Procedure: SHOULDER ARTHROSCOPY WITH DISTAL CLAVICLE RESECTION;  Surgeon: Kathryne Hitch, MD;  Location: [place];  Service: Orthopedics;  Laterality: Left;    There were no vitals filed for this visit.  Visit Diagnosis:  Left shoulder pain  Shoulder weakness  Shoulder stiffness, left      Subjective Assessment - 08/08/15 1527    Subjective Saw MD today who released him to return to work on the 29th with a lot of restrictions. He also needs to stay in the sling for 2 more weeks.    Currently in Pain? Yes   Pain Score --  5-6/10   Pain Location Shoulder   Pain Orientation Left      TODAY'S TREATMENT TherEx - Pulleys Scaption 2', Horiz ABD/ADD at 90 Scaption 1', Flexion 1' Prone Rt Scap retraction with Shoulder Ext 15x  Manual - L GH AP and Caudal glides grade 2 with PROM as able into Flexion and per protocol into ER and ABD.  TherEx - Wand Pullover Flexion  AAROM 15x Standing L Shoulder Ext, Flex, Abd, ER and IR Isometrics against pillow in door frame 10x5" each Standing Rows with Yellow TB 15x Standing Shoulder Ext with Yellow TB 10x NuStep UE + LE lvl 3, 5' (in lieu of aerodyne per protocol)        PT Short Term Goals - 08/03/15 0941    PT SHORT TERM GOAL #1   Title L shoulder PROM Flexion to 160, ABD to 150, and ER to 75 at 90 ABD by 08/18/15   Status On-going           PT Long Term Goals - 08/03/15 0941    PT LONG TERM GOAL #1   Title L Shoulder AROM WFL all planes without limit by pain by 09/27/15   Status On-going    PT LONG TERM GOAL #2   Title L Shoulder MMT 4/5 or better all planes by 09/27/15   Status On-going   PT LONG TERM GOAL #3   Title pt able to return to all ADLs, chores, and work duties without limitation by shoulder pain, LOM, or weakness by 09/27/15   Status On-going               Plan - 08/08/15 1602    Clinical Impression Statement Good progress with PROM and new exercises from wk 4 protocol. Did not increase HEP today due to new exercises but will at upcoming appointments. Pt declined ice/vaso due to feeling good.    PT Next Visit Plan Continue with wk 4 protocol.    Consulted and Agree with Plan of Care Patient        Problem List Patient Active Problem List   Diagnosis Date Noted  . Complete rotator cuff tear of left shoulder 07/05/2015  . Tennis elbow 05/30/2014  . Rotator cuff (capsule) sprain 03/10/2014  . Right shoulder pain 02/11/2014  . Memory difficulty 12/18/2012  . Gynecomastia, male 07/30/2012  . OSA (obstructive sleep apnea) 04/23/2011  . EXTERNAL HEMORRHOIDS 02/12/2011  . Prostate cancer 11/12/2010  . PLANTAR FASCIITIS 04/09/2010  . Hypothyroidism 12/08/2009  . NEOPLASM OF UNCERTAIN BEHAVIOR OF SKIN 03/10/2009  . OSTEOARTHRITIS 02/14/2009  . HYPERLIPIDEMIA 02/08/2009  . HEARING DEFICIT 02/08/2009  . Essential hypertension 02/08/2009  . ALLERGIC RHINITIS 02/08/2009  . GERD 02/08/2009  . PROTEINURIA 02/08/2009  . DIVERTICULITIS, HX OF 02/08/2009    Barbette Hair, PTA 08/08/2015, 4:07 PM  [facility] [address]  [place], [place], [address] Phone: [phone]   Fax:  [phone]

## 2015-08-10 ENCOUNTER — Ambulatory Visit: Payer: 59 | Admitting: Rehabilitation

## 2015-08-10 DIAGNOSIS — R29898 Other symptoms and signs involving the musculoskeletal system: Secondary | ICD-10-CM

## 2015-08-10 DIAGNOSIS — M25512 Pain in left shoulder: Secondary | ICD-10-CM

## 2015-08-10 DIAGNOSIS — M25612 Stiffness of left shoulder, not elsewhere classified: Secondary | ICD-10-CM

## 2015-08-10 NOTE — Therapy (Signed)
[facility] [address]  [place], [place], [address] Phone: [phone]   Fax:  [phone]  Physical Therapy Treatment  Patient Details  Name: Zachary [name] MRN: [id number] Date of Birth: [birth year] Referring Provider:  Ninetta Lights, MD  Encounter Date: 08/10/2015      PT End of Session - 08/10/15 1530    Visit Number 8   Number of Visits 20   Date for PT Re-Evaluation 09/27/15   PT Start Time 1527   PT Stop Time 1610   PT Time Calculation (min) 43 min   Activity Tolerance Patient tolerated treatment well   Behavior During Therapy [facility] for tasks assessed/performed      Past Medical History  Diagnosis Date  . Hyperlipidemia   . Hypertension   . Insomnia   . Hypothyroidism   . GERD (gastroesophageal reflux disease)   . Arthritis   . Hemorrhoids   . Hearing problem     hearing deficit  . Diverticulitis   . Wears glasses   . Wears hearing aid     both ears  . Cancer 2015    positive prostate cancer bx  . Sleep apnea     uses a cpap    Past Surgical History  Procedure Laterality Date  . Hemorrhoid surgery  08/2006  . Back surgery  1978    herniated disksurgery  . Tendon repair  June 06, 2011    right elbow, Dr. Percell Miller  . Cardiac catheterization  08/01/2010  . Colonoscopy    . Shoulder arthroscopy with subacromial decompression, rotator cuff repair and bicep tendon repair Right 03/10/2014    Procedure: RIGHT SHOULDER ARTHROSCOPY WITH SUBACROMIAL DECOMPRESSION, PARTIAL ACROMIOPLASTY WITH CORACOAROMIAL LABRUM DEBRIDEMENT RELEASE DISTAL CLAVICULECTOMY,  ROTATOR CUFF REPAIR AND EXTENSIVE DEBRIDEMENT;  Surgeon: Ninetta Lights, MD;  Location: [facility];  Service: Orthopedics;  Laterality: Right;  . Exam under anesthesia with manipulation of shoulder Right 09/15/2014    Procedure: RIGHT SHOULDER MANIPULATION UNDER ANESTHESIA;  Surgeon: Ninetta Lights, MD;  Location: [facility];  Service: Orthopedics;  Laterality: Right;  . Shoulder arthroscopy with rotator cuff repair and subacromial decompression Left 07/07/2015    Procedure: LEFT SHOULDER SCOPE DEBRIDEMENT, SUBACROMIAL DECOMPRESSION, DISTAL CLAVICULECTOMY, ROTATOR CUFF REPAIR  ;  Surgeon: Kathryne Hitch, MD;  Location: [place];  Service: Orthopedics;  Laterality: Left;  ANESTHESIA: GENERAL, PRE/POST OP SCALENE  . Shoulder arthroscopy with distal clavicle resection Left 07/07/2015    Procedure: SHOULDER ARTHROSCOPY WITH DISTAL CLAVICLE RESECTION;  Surgeon: Kathryne Hitch, MD;  Location: [facility];  Service: Orthopedics;  Laterality: Left;    There were no vitals filed for this visit.  Visit Diagnosis:  Left shoulder pain  Shoulder weakness  Shoulder stiffness, left      Subjective Assessment - 08/10/15 1530    Subjective Reports just soreness after last visit, no pain. Continues to report complaince with HEP.    Currently in Pain? Yes   Pain Score 5    Pain Location Shoulder   Pain Orientation Left            [facility] PT Assessment - 08/10/15 1532    PROM   PROM Assessment Site Shoulder   Right/Left Shoulder Left   Left Shoulder Flexion 135 Degrees   Left Shoulder ABduction 112 Degrees   Left Shoulder Internal Rotation 50 Degrees  at 80 degrees Abd   Left Shoulder External Rotation 48  Degrees  at 80 degrees Abd      TODAY'S TREATMENT TherEx - Pulleys Scaption 2', Flexion 1, Horiz ABD/ADD at 90 Scaption 1'  Manual - L GH AP and Caudal glides grade 2 with PROM as able into Flexion and per protocol into ER. ROM measurements  TherEx - Wand Pullover Flexion AAROM 15x Wand ER AAROM 10x Standing Lt Shoulder Ext, Flex, Abd, ER and IR Isometrics against pillow in door frame 10x5" each Standing Lt IR with Yellow TB at 0 degrees abduction 10x, Lt ER with Yellow TB at 0 degrees abduction 10x Standing Rows with Yellow TB 20x Standing Shoulder Ext with  Yellow TB 15x Seated pball (65cm) roll out 10x3" NuStep UE + LE lvl 4, 5' (in lieu of aerodyne per protocol) Increased HEP (Isometrics (flexion, extension, IR, ER, Abd), IR/ER with yellow TB at 0 degrees abd) with handout given      PT Short Term Goals - 08/03/15 0941    PT SHORT TERM GOAL #1   Title L shoulder PROM Flexion to 160, ABD to 150, and ER to 75 at 90 ABD by 08/18/15   Status On-going           PT Long Term Goals - 08/03/15 0941    PT LONG TERM GOAL #1   Title L Shoulder AROM WFL all planes without limit by pain by 09/27/15   Status On-going   PT LONG TERM GOAL #2   Title L Shoulder MMT 4/5 or better all planes by 09/27/15   Status On-going   PT LONG TERM GOAL #3   Title pt able to return to all ADLs, chores, and work duties without limitation by shoulder pain, LOM, or weakness by 09/27/15   Status On-going               Plan - 08/10/15 1559    Clinical Impression Statement Increase HEP to include all isometrics and IR/ER with yellow tb. Good tolerance with IR/ER exercise without complaint of pain but noted difficulty with ER. Good progress noted with PROM measurements today. Pain levels seem to stay higher but pt is able to progress through protocol on schedule.    PT Next Visit Plan Begin wk 5 protocol   Consulted and Agree with Plan of Care Patient        Problem List Patient Active Problem List   Diagnosis Date Noted  . Complete rotator cuff tear of left shoulder 07/05/2015  . Tennis elbow 05/30/2014  . Rotator cuff (capsule) sprain 03/10/2014  . Right shoulder pain 02/11/2014  . Memory difficulty 12/18/2012  . Gynecomastia, male 07/30/2012  . OSA (obstructive sleep apnea) 04/23/2011  . EXTERNAL HEMORRHOIDS 02/12/2011  . Prostate cancer 11/12/2010  . PLANTAR FASCIITIS 04/09/2010  . Hypothyroidism 12/08/2009  . NEOPLASM OF UNCERTAIN BEHAVIOR OF SKIN 03/10/2009  . OSTEOARTHRITIS 02/14/2009  . HYPERLIPIDEMIA 02/08/2009  . HEARING DEFICIT  02/08/2009  . Essential hypertension 02/08/2009  . ALLERGIC RHINITIS 02/08/2009  . GERD 02/08/2009  . PROTEINURIA 02/08/2009  . DIVERTICULITIS, HX OF 02/08/2009    Barbette Hair, PTA 08/10/2015, 4:05 PM  [facility] [address]  [place], [place], [address] Phone: [phone]   Fax:  [phone]

## 2015-08-13 ENCOUNTER — Encounter: Payer: Self-pay | Admitting: Family Medicine

## 2015-08-14 MED ORDER — OMEPRAZOLE 40 MG PO CPDR: 40.0000 mg | DELAYED_RELEASE_CAPSULE | Freq: Every day | ORAL | Status: DC

## 2015-08-14 MED ORDER — SODIUM BICARBONATE 650 MG PO TABS: 650.0000 mg | ORAL_TABLET | Freq: Four times a day (QID) | ORAL | Status: DC | PRN

## 2015-08-15 ENCOUNTER — Ambulatory Visit: Payer: 59 | Admitting: Rehabilitation

## 2015-08-15 DIAGNOSIS — M25512 Pain in left shoulder: Secondary | ICD-10-CM | POA: Diagnosis not present

## 2015-08-15 DIAGNOSIS — R29898 Other symptoms and signs involving the musculoskeletal system: Secondary | ICD-10-CM

## 2015-08-15 DIAGNOSIS — M25612 Stiffness of left shoulder, not elsewhere classified: Secondary | ICD-10-CM

## 2015-08-15 NOTE — Therapy (Signed)
[facility] [address]  [place], [place], [address] Phone: [phone]   Fax:  [phone]  Physical Therapy Treatment  Patient Details  Name: Zachary [name] MRN: [id number] Date of Birth: 11/10/1951 Referring Provider:  Ninetta Lights, MD  Encounter Date: 08/15/2015      PT End of Session - 08/15/15 1609    Visit Number 9   Number of Visits 20   Date for PT Re-Evaluation 09/27/15   PT Start Time 6283   PT Stop Time 1648   PT Time Calculation (min) 42 min      Past Medical History  Diagnosis Date  . Hyperlipidemia   . Hypertension   . Insomnia   . Hypothyroidism   . GERD (gastroesophageal reflux disease)   . Arthritis   . Hemorrhoids   . Hearing problem     hearing deficit  . Diverticulitis   . Wears glasses   . Wears hearing aid     both ears  . Cancer 2015    positive prostate cancer bx  . Sleep apnea     uses a cpap    Past Surgical History  Procedure Laterality Date  . Hemorrhoid surgery  08/2006  . Back surgery  1978    herniated disksurgery  . Tendon repair  June 06, 2011    right elbow, Dr. Percell Miller  . Cardiac catheterization  08/01/2010  . Colonoscopy    . Shoulder arthroscopy with subacromial decompression, rotator cuff repair and bicep tendon repair Right 03/10/2014    Procedure: RIGHT SHOULDER ARTHROSCOPY WITH SUBACROMIAL DECOMPRESSION, PARTIAL ACROMIOPLASTY WITH CORACOAROMIAL LABRUM DEBRIDEMENT RELEASE DISTAL CLAVICULECTOMY,  ROTATOR CUFF REPAIR AND EXTENSIVE DEBRIDEMENT;  Surgeon: Ninetta Lights, MD;  Location: [place];  Service: Orthopedics;  Laterality: Right;  . Exam under anesthesia with manipulation of shoulder Right 09/15/2014    Procedure: RIGHT SHOULDER MANIPULATION UNDER ANESTHESIA;  Surgeon: Ninetta Lights, MD;  Location: [facility] [place];  Service: Orthopedics;  Laterality: Right;  . Shoulder arthroscopy with rotator cuff repair and  subacromial decompression Left 07/07/2015    Procedure: LEFT SHOULDER SCOPE DEBRIDEMENT, SUBACROMIAL DECOMPRESSION, DISTAL CLAVICULECTOMY, ROTATOR CUFF REPAIR  ;  Surgeon: Kathryne Hitch, MD;  Location: [facility];  Service: Orthopedics;  Laterality: Left;  ANESTHESIA: GENERAL, PRE/POST OP SCALENE  . Shoulder arthroscopy with distal clavicle resection Left 07/07/2015    Procedure: SHOULDER ARTHROSCOPY WITH DISTAL CLAVICLE RESECTION;  Surgeon: Kathryne Hitch, MD;  Location: [place];  Service: Orthopedics;  Laterality: Left;    There were no vitals filed for this visit.  Visit Diagnosis:  Left shoulder pain  Shoulder weakness  Shoulder stiffness, left      Subjective Assessment - 08/15/15 1607    Subjective Reports exercises are going well with no increase in pain after last visit. 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    Currently in Pain? No/denies      TODAY'S TREATMENT TherEx - Pulleys Scaption 2', Flexion 1, Horiz ABD/ADD at 90 Scaption 1'  Manual - L GH AP and Caudal glides grade 2 with PROM as able into Flexion and per protocol into ER. ROM measurements  TherEx - Wand Pullover Flexion AAROM 15x Wand ER AAROM 10x at side then with arms abducted to about 80 degrees abduction 15x Rhythmic Stab at 90 degrees flexion 2x30" all directions IR/ER iso at approx 45 degrees abduction 10x5" each way Standing  Cane AAROM: Flexion 10x, abduction 10x, extension 10x, IR 10x Standing Rows with Yellow TB 20x Standing Shoulder Ext with Yellow TB 15x Standing Lt IR with Yellow TB at 0 degrees abduction 15x, Lt ER with Yellow TB at 0 degrees abduction 15x NuStep UE + LE lvl 4, 5' (in lieu of aerodyne per protocol)       PT Short Term Goals - 08/03/15 0941    PT SHORT TERM GOAL #1   Title L shoulder PROM Flexion to 160, ABD to 150, and ER to 75 at 90 ABD by 08/18/15   Status On-going           PT Long Term Goals -  08/03/15 0941    PT LONG TERM GOAL #1   Title L Shoulder AROM WFL all planes without limit by pain by 09/27/15   Status On-going   PT LONG TERM GOAL #2   Title L Shoulder MMT 4/5 or better all planes by 09/27/15   Status On-going   PT LONG TERM GOAL #3   Title pt able to return to all ADLs, chores, and work duties without limitation by shoulder pain, LOM, or weakness by 09/27/15   Status On-going               Plan - 08/15/15 1642    Clinical Impression Statement Noted difficulty with standing cane exercises with pt noting slight pain but able to complete. Pt continues to have a difficult time relaxing during PROM but ROM seems to be improving. Increased reps today and will add in other exercises from wk 5 at next appointment.    PT Next Visit Plan Continue wk 5 protocol, measure ROM   Consulted and Agree with Plan of Care Patient        Problem List Patient Active Problem List   Diagnosis Date Noted  . Complete rotator cuff tear of left shoulder 07/05/2015  . Tennis elbow 05/30/2014  . Rotator cuff (capsule) sprain 03/10/2014  . Right shoulder pain 02/11/2014  . Memory difficulty 12/18/2012  . Gynecomastia, male 07/30/2012  . OSA (obstructive sleep apnea) 04/23/2011  . EXTERNAL HEMORRHOIDS 02/12/2011  . Prostate cancer 11/12/2010  . PLANTAR FASCIITIS 04/09/2010  . Hypothyroidism 12/08/2009  . NEOPLASM OF UNCERTAIN BEHAVIOR OF SKIN 03/10/2009  . OSTEOARTHRITIS 02/14/2009  . HYPERLIPIDEMIA 02/08/2009  . HEARING DEFICIT 02/08/2009  . Essential hypertension 02/08/2009  . ALLERGIC RHINITIS 02/08/2009  . GERD 02/08/2009  . PROTEINURIA 02/08/2009  . DIVERTICULITIS, HX OF 02/08/2009    Barbette Hair, PTA 08/15/2015, 4:44 PM  [facility] [address]  [place], [place], [address] Phone: [phone]   Fax:  [phone]

## 2015-08-17 ENCOUNTER — Ambulatory Visit: Payer: 59 | Admitting: Rehabilitation

## 2015-08-17 DIAGNOSIS — M25512 Pain in left shoulder: Secondary | ICD-10-CM

## 2015-08-17 DIAGNOSIS — M25612 Stiffness of left shoulder, not elsewhere classified: Secondary | ICD-10-CM

## 2015-08-17 DIAGNOSIS — R29898 Other symptoms and signs involving the musculoskeletal system: Secondary | ICD-10-CM

## 2015-08-17 NOTE — Therapy (Signed)
[facility] [address]  [place], [place], [address] Phone: [phone]   Fax:  [phone]  Physical Therapy Treatment  Patient Details  Name: Zachary [name] MRN: [id number] Date of Birth: 15-Dec-1951 Referring Provider:  Ninetta Lights, MD  Encounter Date: 08/17/2015      PT End of Session - 08/17/15 1616    Visit Number 10   Number of Visits 20   Date for PT Re-Evaluation 09/27/15   PT Start Time 4481   PT Stop Time 8563   PT Time Calculation (min) 39 min      Past Medical History  Diagnosis Date  . Hyperlipidemia   . Hypertension   . Insomnia   . Hypothyroidism   . GERD (gastroesophageal reflux disease)   . Arthritis   . Hemorrhoids   . Hearing problem     hearing deficit  . Diverticulitis   . Wears glasses   . Wears hearing aid     both ears  . Cancer 2015    positive prostate cancer bx  . Sleep apnea     uses a cpap    Past Surgical History  Procedure Laterality Date  . Hemorrhoid surgery  08/2006  . Back surgery  1978    herniated disksurgery  . Tendon repair  June 06, 2011    right elbow, Dr. Percell Miller  . Cardiac catheterization  08/01/2010  . Colonoscopy    . Shoulder arthroscopy with subacromial decompression, rotator cuff repair and bicep tendon repair Right 03/10/2014    Procedure: RIGHT SHOULDER ARTHROSCOPY WITH SUBACROMIAL DECOMPRESSION, PARTIAL ACROMIOPLASTY WITH CORACOAROMIAL LABRUM DEBRIDEMENT RELEASE DISTAL CLAVICULECTOMY,  ROTATOR CUFF REPAIR AND EXTENSIVE DEBRIDEMENT;  Surgeon: Ninetta Lights, MD;  Location: [place];  Service: Orthopedics;  Laterality: Right;  . Exam under anesthesia with manipulation of shoulder Right 09/15/2014    Procedure: RIGHT SHOULDER MANIPULATION UNDER ANESTHESIA;  Surgeon: Ninetta Lights, MD;  Location: [place] [facility];  Service: Orthopedics;  Laterality: Right;  . Shoulder arthroscopy with rotator cuff repair and  subacromial decompression Left 07/07/2015    Procedure: LEFT SHOULDER SCOPE DEBRIDEMENT, SUBACROMIAL DECOMPRESSION, DISTAL CLAVICULECTOMY, ROTATOR CUFF REPAIR  ;  Surgeon: Kathryne Hitch, MD;  Location: [facility];  Service: Orthopedics;  Laterality: Left;  ANESTHESIA: GENERAL, PRE/POST OP SCALENE  . Shoulder arthroscopy with distal clavicle resection Left 07/07/2015    Procedure: SHOULDER ARTHROSCOPY WITH DISTAL CLAVICLE RESECTION;  Surgeon: Kathryne Hitch, MD;  Location: [place];  Service: Orthopedics;  Laterality: Left;    There were no vitals filed for this visit.  Visit Diagnosis:  Left shoulder pain  Shoulder weakness  Shoulder stiffness, left      Subjective Assessment - 08/17/15 1617    Subjective Felt ok after last time but did have a pain increase after the new exercises (5/10).    Currently in Pain? No/denies            [facility] PT Assessment - 08/17/15 1617    Observation/Other Assessments   Focus on Therapeutic Outcomes (FOTO)  56% limitation   PROM   PROM Assessment Site Shoulder   Right/Left Shoulder Left   Left Shoulder Flexion 145 Degrees   Left Shoulder ABduction 120 Degrees   Left Shoulder Internal Rotation 60 Degrees  at 80 degrees abduction   Left Shoulder External Rotation 53 Degrees  at 80 degrees abduction      TODAY'S TREATMENT TherEx - Pulleys Scaption  2', Flexion 1', Horiz ABD/ADD at 90 Scaption 1'  Manual - L GH AP and Caudal glides grade 2 with PROM as able into Flexion and per protocol into ER. ROM measurements  TherEx - Standing Cane AAROM: Flexion 15x, abduction 15x, extension 15x, IR 15x Pball (55cm) roll up wall 10x Standing Rows with Yellow TB 20x Standing Shoulder Ext with Yellow TB 20x Standing Lt IR with Yellow TB at 0 degrees abduction 15x, Lt ER with Yellow TB at 0 degrees abduction 15x Supine Horizontal Abduction Yellow TB 10x Supine Rt Flexion Yellow TB 10x Supine Rt Serratus Punches 3#  15x Supine Rt Circles at 90 degrees Flexion 3# 15x NuStep UE + LE lvl 5, 5' (in lieu of aerodyne per protocol)       PT Short Term Goals - 08/03/15 0941    PT SHORT TERM GOAL #1   Title L shoulder PROM Flexion to 160, ABD to 150, and ER to 75 at 90 ABD by 08/18/15   Status On-going           PT Long Term Goals - 08/03/15 0941    PT LONG TERM GOAL #1   Title L Shoulder AROM WFL all planes without limit by pain by 09/27/15   Status On-going   PT LONG TERM GOAL #2   Title L Shoulder MMT 4/5 or better all planes by 09/27/15   Status On-going   PT LONG TERM GOAL #3   Title pt able to return to all ADLs, chores, and work duties without limitation by shoulder pain, LOM, or weakness by 09/27/15   Status On-going               Plan - 08/17/15 1647    Clinical Impression Statement Good progress noted with PROM measurements today with all directions limited by pain. Added horizontal abduction and flexion with TB today and kept them supine due to poor mechanics with standing.    PT Next Visit Plan Begin wk 6 protocol   Consulted and Agree with Plan of Care Patient        Problem List Patient Active Problem List   Diagnosis Date Noted  . Complete rotator cuff tear of left shoulder 07/05/2015  . Tennis elbow 05/30/2014  . Rotator cuff (capsule) sprain 03/10/2014  . Right shoulder pain 02/11/2014  . Memory difficulty 12/18/2012  . Gynecomastia, male 07/30/2012  . OSA (obstructive sleep apnea) 04/23/2011  . EXTERNAL HEMORRHOIDS 02/12/2011  . Prostate cancer 11/12/2010  . PLANTAR FASCIITIS 04/09/2010  . Hypothyroidism 12/08/2009  . NEOPLASM OF UNCERTAIN BEHAVIOR OF SKIN 03/10/2009  . OSTEOARTHRITIS 02/14/2009  . HYPERLIPIDEMIA 02/08/2009  . HEARING DEFICIT 02/08/2009  . Essential hypertension 02/08/2009  . ALLERGIC RHINITIS 02/08/2009  . GERD 02/08/2009  . PROTEINURIA 02/08/2009  . DIVERTICULITIS, HX OF 02/08/2009    Barbette Hair, PTA 08/17/2015, 4:51 PM  [facility] [address]  [place], [place], [address] Phone: [phone]   Fax:  [phone]

## 2015-08-22 ENCOUNTER — Ambulatory Visit: Payer: 59 | Admitting: Physical Therapy

## 2015-08-22 DIAGNOSIS — M25612 Stiffness of left shoulder, not elsewhere classified: Secondary | ICD-10-CM

## 2015-08-22 DIAGNOSIS — M25512 Pain in left shoulder: Secondary | ICD-10-CM

## 2015-08-22 DIAGNOSIS — R29898 Other symptoms and signs involving the musculoskeletal system: Secondary | ICD-10-CM

## 2015-08-22 NOTE — Therapy (Signed)
[facility] [address]  [place], [place], [address] Phone: [phone]   Fax:  [phone]  Physical Therapy Treatment  Patient Details  Name: Zachary [name] MRN: [id number] Date of Birth: [birth year] Referring Provider:  Ninetta Lights, MD  Encounter Date: 08/22/2015      PT End of Session - 08/22/15 1540    Visit Number 11   Number of Visits 20   Date for PT Re-Evaluation 09/27/15   PT Start Time 6962   PT Stop Time 1620   PT Time Calculation (min) 42 min      Past Medical History  Diagnosis Date  . Hyperlipidemia   . Hypertension   . Insomnia   . Hypothyroidism   . GERD (gastroesophageal reflux disease)   . Arthritis   . Hemorrhoids   . Hearing problem     hearing deficit  . Diverticulitis   . Wears glasses   . Wears hearing aid     both ears  . Cancer 2015    positive prostate cancer bx  . Sleep apnea     uses a cpap    Past Surgical History  Procedure Laterality Date  . Hemorrhoid surgery  08/2006  . Back surgery  1978    herniated disksurgery  . Tendon repair  June 06, 2011    right elbow, Dr. Percell Miller  . Cardiac catheterization  08/01/2010  . Colonoscopy    . Shoulder arthroscopy with subacromial decompression, rotator cuff repair and bicep tendon repair Right 03/10/2014    Procedure: RIGHT SHOULDER ARTHROSCOPY WITH SUBACROMIAL DECOMPRESSION, PARTIAL ACROMIOPLASTY WITH CORACOAROMIAL LABRUM DEBRIDEMENT RELEASE DISTAL CLAVICULECTOMY,  ROTATOR CUFF REPAIR AND EXTENSIVE DEBRIDEMENT;  Surgeon: Ninetta Lights, MD;  Location: [facility];  Service: Orthopedics;  Laterality: Right;  . Exam under anesthesia with manipulation of shoulder Right 09/15/2014    Procedure: RIGHT SHOULDER MANIPULATION UNDER ANESTHESIA;  Surgeon: Ninetta Lights, MD;  Location: [place];  Service: Orthopedics;  Laterality: Right;  . Shoulder arthroscopy with rotator cuff repair and  subacromial decompression Left 07/07/2015    Procedure: LEFT SHOULDER SCOPE DEBRIDEMENT, SUBACROMIAL DECOMPRESSION, DISTAL CLAVICULECTOMY, ROTATOR CUFF REPAIR  ;  Surgeon: Kathryne Hitch, MD;  Location: [facility];  Service: Orthopedics;  Laterality: Left;  ANESTHESIA: GENERAL, PRE/POST OP SCALENE  . Shoulder arthroscopy with distal clavicle resection Left 07/07/2015    Procedure: SHOULDER ARTHROSCOPY WITH DISTAL CLAVICLE RESECTION;  Surgeon: Kathryne Hitch, MD;  Location: [place];  Service: Orthopedics;  Laterality: Left;    There were no vitals filed for this visit.  Visit Diagnosis:  Left shoulder pain  Shoulder weakness  Shoulder stiffness, left      Subjective Assessment - 08/22/15 1539    Subjective States continues to experience intermittent bouts of increased pain and difficulty sleeping.  Today 5/10.    Currently in Pain? Yes   Pain Score 5   pt rates pain 3-6/10 lately.   Pain Location Shoulder       TODAY'S TREATMENT Manual - L shoulder grade 3 AP and caudal glides with PROM into ER and Flexion  TherEx - UBE lvl 1.0 1'/1' Posterior capsule stretch Seated ER stretch with Red TB 10x5" with elbow supported 90dg ABD Low Row 20# 2x15 Corner Pec Stretch 3x20" Hand on wall shoulder flexion slide, 2# 2x12 Narrow Pulldown (flexion stretch) 15# 10x Chest Press Machine serratus press 10# 12x Chest Press Plus 10# 12x  [facility] PT Assessment - 08/22/15 0001    PROM   Left Shoulder Flexion 150 Degrees   Left Shoulder Internal Rotation 65 Degrees                       PT Short Term Goals - 08/03/15 0941    PT SHORT TERM GOAL #1   Title L shoulder PROM Flexion to 160, ABD to 150, and ER to 75 at 90 ABD by 08/18/15   Status On-going           PT Long Term Goals - 08/03/15 0941    PT LONG TERM GOAL #1   Title L Shoulder AROM WFL all planes without limit by pain by 09/27/15   Status On-going   PT LONG TERM GOAL #2   Title  L Shoulder MMT 4/5 or better all planes by 09/27/15   Status On-going   PT LONG TERM GOAL #3   Title pt able to return to all ADLs, chores, and work duties without limitation by shoulder pain, LOM, or weakness by 09/27/15   Status On-going               Plan - 08/22/15 1541    Clinical Impression Statement pt is 6 weeks out and will be 7wks out this Friday.  His PROM is good and seems to be progressing but is not where protocol recommends at this point.  He displays posterior capsule tightness.  Very good tolerance with exercise progressions today.  Pt advised he can wean from sling and should be able to drive within next 1-2 weeks.   PT Next Visit Plan continue progressing strength and ROM, pt will be 7 wks out on 08/25/15   Consulted and Agree with Plan of Care Patient        Problem List Patient Active Problem List   Diagnosis Date Noted  . Complete rotator cuff tear of left shoulder 07/05/2015  . Tennis elbow 05/30/2014  . Rotator cuff (capsule) sprain 03/10/2014  . Right shoulder pain 02/11/2014  . Memory difficulty 12/18/2012  . Gynecomastia, male 07/30/2012  . OSA (obstructive sleep apnea) 04/23/2011  . EXTERNAL HEMORRHOIDS 02/12/2011  . Prostate cancer 11/12/2010  . PLANTAR FASCIITIS 04/09/2010  . Hypothyroidism 12/08/2009  . NEOPLASM OF UNCERTAIN BEHAVIOR OF SKIN 03/10/2009  . OSTEOARTHRITIS 02/14/2009  . HYPERLIPIDEMIA 02/08/2009  . HEARING DEFICIT 02/08/2009  . Essential hypertension 02/08/2009  . ALLERGIC RHINITIS 02/08/2009  . GERD 02/08/2009  . PROTEINURIA 02/08/2009  . DIVERTICULITIS, HX OF 02/08/2009    Shaunette Gassner PT, OCS 08/22/2015, 4:23 PM  [facility] [address]  [place], [place], [address] Phone: [phone]   Fax:  [phone]

## 2015-08-24 ENCOUNTER — Ambulatory Visit: Payer: 59 | Attending: Orthopedic Surgery | Admitting: Physical Therapy

## 2015-08-24 DIAGNOSIS — R29898 Other symptoms and signs involving the musculoskeletal system: Secondary | ICD-10-CM | POA: Insufficient documentation

## 2015-08-24 DIAGNOSIS — M25512 Pain in left shoulder: Secondary | ICD-10-CM | POA: Diagnosis present

## 2015-08-24 DIAGNOSIS — M25612 Stiffness of left shoulder, not elsewhere classified: Secondary | ICD-10-CM | POA: Diagnosis present

## 2015-08-24 NOTE — Therapy (Signed)
[facility] [address]  [place], [place], [address] Phone: [phone]   Fax:  [phone]  Physical Therapy Treatment  Patient Details  Name: Zachary [name] MRN: [id number] Date of Birth: 02-07-1951 Referring Provider:  Ninetta Lights, MD  Encounter Date: 08/24/2015      PT End of Session - 08/24/15 1105    Visit Number 12   Number of Visits 20   Date for PT Re-Evaluation 09/27/15   PT Start Time 1104   PT Stop Time 1146   PT Time Calculation (min) 42 min      Past Medical History  Diagnosis Date  . Hyperlipidemia   . Hypertension   . Insomnia   . Hypothyroidism   . GERD (gastroesophageal reflux disease)   . Arthritis   . Hemorrhoids   . Hearing problem     hearing deficit  . Diverticulitis   . Wears glasses   . Wears hearing aid     both ears  . Cancer 2015    positive prostate cancer bx  . Sleep apnea     uses a cpap    Past Surgical History  Procedure Laterality Date  . Hemorrhoid surgery  08/2006  . Back surgery  1978    herniated disksurgery  . Tendon repair  June 06, 2011    right elbow, Dr. Percell Miller  . Cardiac catheterization  08/01/2010  . Colonoscopy    . Shoulder arthroscopy with subacromial decompression, rotator cuff repair and bicep tendon repair Right 03/10/2014    Procedure: RIGHT SHOULDER ARTHROSCOPY WITH SUBACROMIAL DECOMPRESSION, PARTIAL ACROMIOPLASTY WITH CORACOAROMIAL LABRUM DEBRIDEMENT RELEASE DISTAL CLAVICULECTOMY,  ROTATOR CUFF REPAIR AND EXTENSIVE DEBRIDEMENT;  Surgeon: Ninetta Lights, MD;  Location: [facility];  Service: Orthopedics;  Laterality: Right;  . Exam under anesthesia with manipulation of shoulder Right 09/15/2014    Procedure: RIGHT SHOULDER MANIPULATION UNDER ANESTHESIA;  Surgeon: Ninetta Lights, MD;  Location: [facility];  Service: Orthopedics;  Laterality: Right;  . Shoulder arthroscopy with rotator cuff repair and  subacromial decompression Left 07/07/2015    Procedure: LEFT SHOULDER SCOPE DEBRIDEMENT, SUBACROMIAL DECOMPRESSION, DISTAL CLAVICULECTOMY, ROTATOR CUFF REPAIR  ;  Surgeon: Kathryne Hitch, MD;  Location: [facility];  Service: Orthopedics;  Laterality: Left;  ANESTHESIA: GENERAL, PRE/POST OP SCALENE  . Shoulder arthroscopy with distal clavicle resection Left 07/07/2015    Procedure: SHOULDER ARTHROSCOPY WITH DISTAL CLAVICLE RESECTION;  Surgeon: Kathryne Hitch, MD;  Location: [facility];  Service: Orthopedics;  Laterality: Left;    There were no vitals filed for this visit.  Visit Diagnosis:  Left shoulder pain  Shoulder weakness  Shoulder stiffness, left      Subjective Assessment - 08/24/15 1106    Subjective notes a little soreness after last treatment.  States did not sleep well last night stating felt good when he went to bed so he didn't take any pain meds.  States pain hit around 1AM and didn't sleep well after that.   Currently in Pain? Yes   Pain Score 4    Pain Location Shoulder   Pain Orientation Left         TODAY'S TREATMENT TherEx - UBE lvl 1.0 90"/90" Pulleys Flexion, Scaption, IR 15x each Seated Posterior capsule stretch Seated ER stretch with Red TB 15x5" with elbow supported 90dg ABD Seated ER RROM Yellow TB 2x10 with elbow supported 90dg ABD Prone over Plinth corner scap retraction with:  B Shoulder Ext 10x, "W" 10x Low Row 25# 2x12 Hooklying on 1/2 Foam Roll: Pec Stretch 1', Pullover 4# 12x, B ER Yellow TB 15x  Manual - L shoulder grade 3 AP and caudal glides along with STM to posterior R shoulder with PROM into ER and Flexion                          PT Short Term Goals - 08/03/15 0941    PT SHORT TERM GOAL #1   Title L shoulder PROM Flexion to 160, ABD to 150, and ER to 75 at 90 ABD by 08/18/15   Status On-going           PT Long Term Goals - 08/03/15 0941    PT LONG TERM GOAL #1   Title L Shoulder  AROM WFL all planes without limit by pain by 09/27/15   Status On-going   PT LONG TERM GOAL #2   Title L Shoulder MMT 4/5 or better all planes by 09/27/15   Status On-going   PT LONG TERM GOAL #3   Title pt able to return to all ADLs, chores, and work duties without limitation by shoulder pain, LOM, or weakness by 09/27/15   Status On-going               Plan - 08/24/15 1151    Clinical Impression Statement progressing well, mild stiffness into Flexion and ER persist but otherwise keeping up with protocol.  Will be at 7wk portion next visit.   PT Next Visit Plan continue progressing per protocol (7wks out on 08/25/15)   Consulted and Agree with Plan of Care Patient        Problem List Patient Active Problem List   Diagnosis Date Noted  . Complete rotator cuff tear of left shoulder 07/05/2015  . Tennis elbow 05/30/2014  . Rotator cuff (capsule) sprain 03/10/2014  . Right shoulder pain 02/11/2014  . Memory difficulty 12/18/2012  . Gynecomastia, male 07/30/2012  . OSA (obstructive sleep apnea) 04/23/2011  . EXTERNAL HEMORRHOIDS 02/12/2011  . Prostate cancer 11/12/2010  . PLANTAR FASCIITIS 04/09/2010  . Hypothyroidism 12/08/2009  . NEOPLASM OF UNCERTAIN BEHAVIOR OF SKIN 03/10/2009  . OSTEOARTHRITIS 02/14/2009  . HYPERLIPIDEMIA 02/08/2009  . HEARING DEFICIT 02/08/2009  . Essential hypertension 02/08/2009  . ALLERGIC RHINITIS 02/08/2009  . GERD 02/08/2009  . PROTEINURIA 02/08/2009  . DIVERTICULITIS, HX OF 02/08/2009    Allahna Husband PT, OCS 08/24/2015, 11:53 AM  [facility] [place], [place], [address] Phone: [phone]   Fax:  [phone]

## 2015-08-29 ENCOUNTER — Ambulatory Visit: Payer: 59 | Admitting: Physical Therapy

## 2015-08-29 DIAGNOSIS — M25512 Pain in left shoulder: Secondary | ICD-10-CM | POA: Diagnosis not present

## 2015-08-29 DIAGNOSIS — M25612 Stiffness of left shoulder, not elsewhere classified: Secondary | ICD-10-CM

## 2015-08-29 DIAGNOSIS — R29898 Other symptoms and signs involving the musculoskeletal system: Secondary | ICD-10-CM

## 2015-08-29 NOTE — Therapy (Signed)
[facility] [address]  [place], [place], [address] Phone: [phone]   Fax:  [phone]  Physical Therapy Treatment  Patient Details  Name: Zachary [name] MRN: [id number] Date of Birth: [birth year] Referring Provider:  Ninetta Lights, MD  Encounter Date: 08/29/2015      PT End of Session - 08/29/15 1633    Visit Number 13   Number of Visits 20   Date for PT Re-Evaluation 09/27/15   PT Start Time 6160   PT Stop Time 1708   PT Time Calculation (min) 37 min      Past Medical History  Diagnosis Date  . Hyperlipidemia   . Hypertension   . Insomnia   . Hypothyroidism   . GERD (gastroesophageal reflux disease)   . Arthritis   . Hemorrhoids   . Hearing problem     hearing deficit  . Diverticulitis   . Wears glasses   . Wears hearing aid     both ears  . Cancer 2015    positive prostate cancer bx  . Sleep apnea     uses a cpap    Past Surgical History  Procedure Laterality Date  . Hemorrhoid surgery  08/2006  . Back surgery  1978    herniated disksurgery  . Tendon repair  June 06, 2011    right elbow, Dr. Percell Miller  . Cardiac catheterization  08/01/2010  . Colonoscopy    . Shoulder arthroscopy with subacromial decompression, rotator cuff repair and bicep tendon repair Right 03/10/2014    Procedure: RIGHT SHOULDER ARTHROSCOPY WITH SUBACROMIAL DECOMPRESSION, PARTIAL ACROMIOPLASTY WITH CORACOAROMIAL LABRUM DEBRIDEMENT RELEASE DISTAL CLAVICULECTOMY,  ROTATOR CUFF REPAIR AND EXTENSIVE DEBRIDEMENT;  Surgeon: Ninetta Lights, MD;  Location: [place] [place];  Service: Orthopedics;  Laterality: Right;  . Exam under anesthesia with manipulation of shoulder Right 09/15/2014    Procedure: RIGHT SHOULDER MANIPULATION UNDER ANESTHESIA;  Surgeon: Ninetta Lights, MD;  Location: [place];  Service: Orthopedics;  Laterality: Right;  . Shoulder arthroscopy with rotator cuff repair and  subacromial decompression Left 07/07/2015    Procedure: LEFT SHOULDER SCOPE DEBRIDEMENT, SUBACROMIAL DECOMPRESSION, DISTAL CLAVICULECTOMY, ROTATOR CUFF REPAIR  ;  Surgeon: Kathryne Hitch, MD;  Location: [facility];  Service: Orthopedics;  Laterality: Left;  ANESTHESIA: GENERAL, PRE/POST OP SCALENE  . Shoulder arthroscopy with distal clavicle resection Left 07/07/2015    Procedure: SHOULDER ARTHROSCOPY WITH DISTAL CLAVICLE RESECTION;  Surgeon: Kathryne Hitch, MD;  Location: [facility] [place];  Service: Orthopedics;  Laterality: Left;    There were no vitals filed for this visit.  Visit Diagnosis:  Left shoulder pain  Shoulder weakness  Shoulder stiffness, left      Subjective Assessment - 08/29/15 1631    Subjective states shoulder more sore today and had difficulty sleeping last night due to pain.   Currently in Pain? Yes   Pain Score 5    Pain Location Shoulder   Pain Orientation Left            [facility] PT Assessment - 08/29/15 0001    PROM   Left Shoulder Flexion 155 Degrees   Left Shoulder Internal Rotation 65 Degrees   Left Shoulder External Rotation 69 Degrees      TODAY'S TREATMENT Manual - L shoulder grade 3 AP and caudal glides along with STM to posterior R shoulder with PROM into ER and Flexion  TherEx -  R side-lying L Shoulder ABD AROM  12x, Horiz ABD 12x, ER 1# 20x Supine L shoulder D2 Flexion 1# 12x Supine L CW and CCW 5# 15x each TRX Low Row 15x TRX Chest Press 10x Pulldown 15# for flexion stretch 15x 1/2 kneeling one arm row 8# 15x  4 strips kinesiotape L shoulder               PT Short Term Goals - 08/03/15 0941    PT SHORT TERM GOAL #1   Title L shoulder PROM Flexion to 160, ABD to 150, and ER to 75 at 90 ABD by 08/18/15   Status On-going           PT Long Term Goals - 08/29/15 1818    PT LONG TERM GOAL #1   Title L Shoulder AROM WFL all planes without limit by pain by 09/27/15   Status On-going   PT LONG TERM  GOAL #2   Title L Shoulder MMT 4/5 or better all planes by 09/27/15   Status On-going   PT LONG TERM GOAL #3   Title pt able to return to all ADLs, chores, and work duties without limitation by shoulder pain, LOM, or weakness by 09/27/15   Status On-going               Plan - 08/29/15 1815    Clinical Impression Statement [name] with increased shoulder pain today for unknown reason.  He states he working on weaning off sling which could be part of issue.  He's over 7 weeks out from surgery so it is certainly safe to wean from sling at this time.  His shoudler PROM values were best to date despite his increased pain.  He performed very well overall today and declined need for ice after treatment.  Trial of kinesiotape to L shoulder today to see if this can help with pain and with sleeping.   PT Next Visit Plan continue stretching and strengthening as tolerated.  Tape prn if benefit noted.  Manual and modalities for pain and ROM.   Consulted and Agree with Plan of Care Patient        Problem List Patient Active Problem List   Diagnosis Date Noted  . Complete rotator cuff tear of left shoulder 07/05/2015  . Tennis elbow 05/30/2014  . Rotator cuff (capsule) sprain 03/10/2014  . Right shoulder pain 02/11/2014  . Memory difficulty 12/18/2012  . Gynecomastia, male 07/30/2012  . OSA (obstructive sleep apnea) 04/23/2011  . EXTERNAL HEMORRHOIDS 02/12/2011  . Prostate cancer 11/12/2010  . PLANTAR FASCIITIS 04/09/2010  . Hypothyroidism 12/08/2009  . NEOPLASM OF UNCERTAIN BEHAVIOR OF SKIN 03/10/2009  . OSTEOARTHRITIS 02/14/2009  . HYPERLIPIDEMIA 02/08/2009  . HEARING DEFICIT 02/08/2009  . Essential hypertension 02/08/2009  . ALLERGIC RHINITIS 02/08/2009  . GERD 02/08/2009  . PROTEINURIA 02/08/2009  . DIVERTICULITIS, HX OF 02/08/2009    Mayetta Castleman PT, OCS 08/29/2015, 6:19 PM  [facility] [address]  [place],  [place], [address] Phone: [phone]   Fax:  [phone]

## 2015-08-31 ENCOUNTER — Ambulatory Visit: Payer: 59 | Admitting: Rehabilitation

## 2015-08-31 DIAGNOSIS — R29898 Other symptoms and signs involving the musculoskeletal system: Secondary | ICD-10-CM

## 2015-08-31 DIAGNOSIS — M25512 Pain in left shoulder: Secondary | ICD-10-CM

## 2015-08-31 DIAGNOSIS — M25612 Stiffness of left shoulder, not elsewhere classified: Secondary | ICD-10-CM

## 2015-08-31 NOTE — Therapy (Signed)
[facility] [address]  [place], [place], [address] Phone: [phone]   Fax:  [phone]  Physical Therapy Treatment  Patient Details  Name: Zachary [name] MRN: [id number] Date of Birth: 07-Aug-1951 Referring Provider:  Ninetta Lights, MD  Encounter Date: 08/31/2015      PT End of Session - 08/31/15 1612    Visit Number 14   Number of Visits 20   Date for PT Re-Evaluation 09/27/15   PT Start Time 1761   PT Stop Time 1651   PT Time Calculation (min) 41 min   Activity Tolerance Patient tolerated treatment well   Behavior During Therapy [facility] for tasks assessed/performed      Past Medical History  Diagnosis Date  . Hyperlipidemia   . Hypertension   . Insomnia   . Hypothyroidism   . GERD (gastroesophageal reflux disease)   . Arthritis   . Hemorrhoids   . Hearing problem     hearing deficit  . Diverticulitis   . Wears glasses   . Wears hearing aid     both ears  . Cancer 2015    positive prostate cancer bx  . Sleep apnea     uses a cpap    Past Surgical History  Procedure Laterality Date  . Hemorrhoid surgery  08/2006  . Back surgery  1978    herniated disksurgery  . Tendon repair  June 06, 2011    right elbow, Dr. Percell Miller  . Cardiac catheterization  08/01/2010  . Colonoscopy    . Shoulder arthroscopy with subacromial decompression, rotator cuff repair and bicep tendon repair Right 03/10/2014    Procedure: RIGHT SHOULDER ARTHROSCOPY WITH SUBACROMIAL DECOMPRESSION, PARTIAL ACROMIOPLASTY WITH CORACOAROMIAL LABRUM DEBRIDEMENT RELEASE DISTAL CLAVICULECTOMY,  ROTATOR CUFF REPAIR AND EXTENSIVE DEBRIDEMENT;  Surgeon: Ninetta Lights, MD;  Location: [place];  Service: Orthopedics;  Laterality: Right;  . Exam under anesthesia with manipulation of shoulder Right 09/15/2014    Procedure: RIGHT SHOULDER MANIPULATION UNDER ANESTHESIA;  Surgeon: Ninetta Lights, MD;  Location: [place];  Service: Orthopedics;  Laterality: Right;  . Shoulder arthroscopy with rotator cuff repair and subacromial decompression Left 07/07/2015    Procedure: LEFT SHOULDER SCOPE DEBRIDEMENT, SUBACROMIAL DECOMPRESSION, DISTAL CLAVICULECTOMY, ROTATOR CUFF REPAIR  ;  Surgeon: Kathryne Hitch, MD;  Location: [place];  Service: Orthopedics;  Laterality: Left;  ANESTHESIA: GENERAL, PRE/POST OP SCALENE  . Shoulder arthroscopy with distal clavicle resection Left 07/07/2015    Procedure: SHOULDER ARTHROSCOPY WITH DISTAL CLAVICLE RESECTION;  Surgeon: Kathryne Hitch, MD;  Location: [place];  Service: Orthopedics;  Laterality: Left;    There were no vitals filed for this visit.  Visit Diagnosis:  Left shoulder pain  Shoulder weakness  Shoulder stiffness, left      Subjective Assessment - 08/31/15 1656    Subjective Thinks the tape helped. Feeling ok today.    Currently in Pain? No/denies         TODAY'S TREATMENT TherEx - UBE lvl 1.0 90"/90" R side-lying L Shoulder ABD AROM 15x, Horiz ABD 15x, ER 2# 10x Hooklying on 1/2 Foam Roll: Pec Stretch 1', Pullover 4# 15x, B ER Yellow TB 15x, B Horiz Abd Yellow TB 15x Pulldown 15# for flexion stretch 15x Low Row 25# 2x12 Standing Lt Lat Stretch at sink 3x20" Corner Pec Stretch 3x20"  Hand on wall shoulder flexion slide, 2# x15 Circles on wall at 90 degrees flexion x15  each way CW/CCW Overhead arc on wall x10 each way   Manual - L shoulder grade 3 AP and caudal glides along with STM to posterior R shoulder with PROM into ER and Flexion       PT Short Term Goals - 08/03/15 0941    PT SHORT TERM GOAL #1   Title L shoulder PROM Flexion to 160, ABD to 150, and ER to 75 at 90 ABD by 08/18/15   Status On-going           PT Long Term Goals - 08/29/15 1818    PT LONG TERM GOAL #1   Title L Shoulder AROM WFL all planes without limit by pain by 09/27/15   Status On-going   PT LONG TERM GOAL #2   Title L  Shoulder MMT 4/5 or better all planes by 09/27/15   Status On-going   PT LONG TERM GOAL #3   Title pt able to return to all ADLs, chores, and work duties without limitation by shoulder pain, LOM, or weakness by 09/27/15   Status On-going               Plan - 08/31/15 1651    Clinical Impression Statement Good tolerance to exercises with minimal complaint of pain. Good motion with ER and flexion PROM today (did not remeasure today).    PT Next Visit Plan continue stretching and strengthening as tolerated.  Tape prn if benefit noted.  Manual and modalities for pain and ROM.   Consulted and Agree with Plan of Care Patient        Problem List Patient Active Problem List   Diagnosis Date Noted  . Complete rotator cuff tear of left shoulder 07/05/2015  . Tennis elbow 05/30/2014  . Rotator cuff (capsule) sprain 03/10/2014  . Right shoulder pain 02/11/2014  . Memory difficulty 12/18/2012  . Gynecomastia, male 07/30/2012  . OSA (obstructive sleep apnea) 04/23/2011  . EXTERNAL HEMORRHOIDS 02/12/2011  . Prostate cancer 11/12/2010  . PLANTAR FASCIITIS 04/09/2010  . Hypothyroidism 12/08/2009  . NEOPLASM OF UNCERTAIN BEHAVIOR OF SKIN 03/10/2009  . OSTEOARTHRITIS 02/14/2009  . HYPERLIPIDEMIA 02/08/2009  . HEARING DEFICIT 02/08/2009  . Essential hypertension 02/08/2009  . ALLERGIC RHINITIS 02/08/2009  . GERD 02/08/2009  . PROTEINURIA 02/08/2009  . DIVERTICULITIS, HX OF 02/08/2009    Barbette Hair, PTA 08/31/2015, 4:57 PM  [facility] [address]  [place], [place], [address] Phone: [phone]   Fax:  [phone]

## 2015-09-05 ENCOUNTER — Ambulatory Visit: Payer: 59 | Admitting: Physical Therapy

## 2015-09-05 DIAGNOSIS — R29898 Other symptoms and signs involving the musculoskeletal system: Secondary | ICD-10-CM

## 2015-09-05 DIAGNOSIS — M25612 Stiffness of left shoulder, not elsewhere classified: Secondary | ICD-10-CM

## 2015-09-05 DIAGNOSIS — M25512 Pain in left shoulder: Secondary | ICD-10-CM

## 2015-09-05 NOTE — Therapy (Signed)
[facility] [address]  [place], [place], [address] Phone: [phone]   Fax:  [phone]  Physical Therapy Treatment  Patient Details  Name: Zachary [name] MRN: [id number] Date of Birth: [birth year] Referring Provider:  Ninetta Lights, MD  Encounter Date: 09/05/2015      PT End of Session - 09/05/15 1450    Visit Number 15   Number of Visits 20   Date for PT Re-Evaluation 09/27/15   PT Start Time 0102   PT Stop Time 1528   PT Time Calculation (min) 40 min      Past Medical History  Diagnosis Date  . Hyperlipidemia   . Hypertension   . Insomnia   . Hypothyroidism   . GERD (gastroesophageal reflux disease)   . Arthritis   . Hemorrhoids   . Hearing problem     hearing deficit  . Diverticulitis   . Wears glasses   . Wears hearing aid     both ears  . Cancer 2015    positive prostate cancer bx  . Sleep apnea     uses a cpap    Past Surgical History  Procedure Laterality Date  . Hemorrhoid surgery  08/2006  . Back surgery  1978    herniated disksurgery  . Tendon repair  June 06, 2011    right elbow, Dr. Percell Miller  . Cardiac catheterization  08/01/2010  . Colonoscopy    . Shoulder arthroscopy with subacromial decompression, rotator cuff repair and bicep tendon repair Right 03/10/2014    Procedure: RIGHT SHOULDER ARTHROSCOPY WITH SUBACROMIAL DECOMPRESSION, PARTIAL ACROMIOPLASTY WITH CORACOAROMIAL LABRUM DEBRIDEMENT RELEASE DISTAL CLAVICULECTOMY,  ROTATOR CUFF REPAIR AND EXTENSIVE DEBRIDEMENT;  Surgeon: Ninetta Lights, MD;  Location: [place];  Service: Orthopedics;  Laterality: Right;  . Exam under anesthesia with manipulation of shoulder Right 09/15/2014    Procedure: RIGHT SHOULDER MANIPULATION UNDER ANESTHESIA;  Surgeon: Ninetta Lights, MD;  Location: [place];  Service: Orthopedics;  Laterality: Right;  . Shoulder arthroscopy with rotator cuff repair and  subacromial decompression Left 07/07/2015    Procedure: LEFT SHOULDER SCOPE DEBRIDEMENT, SUBACROMIAL DECOMPRESSION, DISTAL CLAVICULECTOMY, ROTATOR CUFF REPAIR  ;  Surgeon: Kathryne Hitch, MD;  Location: [place] [facility];  Service: Orthopedics;  Laterality: Left;  ANESTHESIA: GENERAL, PRE/POST OP SCALENE  . Shoulder arthroscopy with distal clavicle resection Left 07/07/2015    Procedure: SHOULDER ARTHROSCOPY WITH DISTAL CLAVICLE RESECTION;  Surgeon: Kathryne Hitch, MD;  Location: [place];  Service: Orthopedics;  Laterality: Left;    There were no vitals filed for this visit.  Visit Diagnosis:  Left shoulder pain  Shoulder weakness  Shoulder stiffness, left      Subjective Assessment - 09/05/15 1446    Subjective Pt states has good days and bad days and same with sleeping (some good nights some bad).  States average pain during work day is 4-5/10 noted mostly with overhead activities.   Currently in Pain? Yes   Pain Score 4    Pain Location Shoulder   Pain Orientation Left            [facility] PT Assessment - 09/05/15 0001    ROM / Strength   AROM / PROM / Strength AROM   AROM   AROM Assessment Site Shoulder   Right/Left Shoulder Left   Left Shoulder Flexion 134 Degrees   Left Shoulder ABduction 120 Degrees   Left Shoulder Internal Rotation 37 Degrees  Left Shoulder External Rotation 80 Degrees   PROM   Left Shoulder Flexion 160 Degrees   Left Shoulder Internal Rotation 70 Degrees   Left Shoulder External Rotation 78 Degrees       TODAY'S TREATMENT TherEx - UBE lvl 3.0 90"/90" Manual - L GH Grade 3 AP with ER, IR, and Flexion mobes; contract/relax into ER and IR  ROM assessment  Narrow Pulldown/Flexion stretch 20# 12x Hand on wall shoulder flexion slides 2# 15x Back to Wall with foam roller along spine B Shoulder Flexion 1# 10x, B Shoulder ABD 1# 10x Standing B Elbows on PBall (55cm) on wall shoulder flexion with scap protraction 12x Standing  B Elbows on wall with Red TB at Wrists B Shoulder Flexion with ER isometric 10x Standing L Shoulder ER Green TB 20x Hooklying on Foam Roll: Pec Stretch 1', Pullover 5# 15x, B D2 Flexion 2# 12x                    PT Short Term Goals - 08/03/15 0941    PT SHORT TERM GOAL #1   Title L shoulder PROM Flexion to 160, ABD to 150, and ER to 75 at 90 ABD by 08/18/15   Status On-going           PT Long Term Goals - 08/29/15 1818    PT LONG TERM GOAL #1   Title L Shoulder AROM WFL all planes without limit by pain by 09/27/15   Status On-going   PT LONG TERM GOAL #2   Title L Shoulder MMT 4/5 or better all planes by 09/27/15   Status On-going   PT LONG TERM GOAL #3   Title pt able to return to all ADLs, chores, and work duties without limitation by shoulder pain, LOM, or weakness by 09/27/15   Status On-going               Plan - 09/05/15 1531    Clinical Impression Statement PROM progressing well, Flexion AROM also pretty good with good mechanics but ABD AROM with poor mechanics if resistance added.   PT Next Visit Plan AROM focus.  Tape prn.  Manual and modalities for pain and ROM.   Consulted and Agree with Plan of Care Patient        Problem List Patient Active Problem List   Diagnosis Date Noted  . Complete rotator cuff tear of left shoulder 07/05/2015  . Tennis elbow 05/30/2014  . Rotator cuff (capsule) sprain 03/10/2014  . Right shoulder pain 02/11/2014  . Memory difficulty 12/18/2012  . Gynecomastia, male 07/30/2012  . OSA (obstructive sleep apnea) 04/23/2011  . EXTERNAL HEMORRHOIDS 02/12/2011  . Prostate cancer 11/12/2010  . PLANTAR FASCIITIS 04/09/2010  . Hypothyroidism 12/08/2009  . NEOPLASM OF UNCERTAIN BEHAVIOR OF SKIN 03/10/2009  . OSTEOARTHRITIS 02/14/2009  . HYPERLIPIDEMIA 02/08/2009  . HEARING DEFICIT 02/08/2009  . Essential hypertension 02/08/2009  . ALLERGIC RHINITIS 02/08/2009  . GERD 02/08/2009  . PROTEINURIA 02/08/2009  .  DIVERTICULITIS, HX OF 02/08/2009    Joleigh Mineau PT, OCS 09/05/2015, 3:33 PM  [facility] [address]  [place], [place], [address] Phone: [phone]   Fax:  [phone]

## 2015-09-07 ENCOUNTER — Ambulatory Visit: Payer: 59 | Admitting: Physical Therapy

## 2015-09-07 DIAGNOSIS — M25612 Stiffness of left shoulder, not elsewhere classified: Secondary | ICD-10-CM

## 2015-09-07 DIAGNOSIS — R29898 Other symptoms and signs involving the musculoskeletal system: Secondary | ICD-10-CM

## 2015-09-07 DIAGNOSIS — M25512 Pain in left shoulder: Secondary | ICD-10-CM | POA: Diagnosis not present

## 2015-09-07 NOTE — Therapy (Addendum)
[facility] [address]  [place], [place], [address] Phone: [phone]   Fax:  [phone]  Physical Therapy Treatment  Patient Details  Name: Zachary [name] MRN: [id number] Date of Birth: 1951-09-21 Referring Provider:  Ninetta Lights, MD  Encounter Date: 09/07/2015      PT End of Session - 09/07/15 1638    Visit Number 16   Number of Visits 20   Date for PT Re-Evaluation 09/27/15   PT Start Time 1623   PT Stop Time 1707   PT Time Calculation (min) 44 min      Past Medical History  Diagnosis Date  . Hyperlipidemia   . Hypertension   . Insomnia   . Hypothyroidism   . GERD (gastroesophageal reflux disease)   . Arthritis   . Hemorrhoids   . Hearing problem     hearing deficit  . Diverticulitis   . Wears glasses   . Wears hearing aid     both ears  . Cancer 2015    positive prostate cancer bx  . Sleep apnea     uses a cpap    Past Surgical History  Procedure Laterality Date  . Hemorrhoid surgery  08/2006  . Back surgery  1978    herniated disksurgery  . Tendon repair  June 06, 2011    right elbow, Dr. Percell Miller  . Cardiac catheterization  08/01/2010  . Colonoscopy    . Shoulder arthroscopy with subacromial decompression, rotator cuff repair and bicep tendon repair Right 03/10/2014    Procedure: RIGHT SHOULDER ARTHROSCOPY WITH SUBACROMIAL DECOMPRESSION, PARTIAL ACROMIOPLASTY WITH CORACOAROMIAL LABRUM DEBRIDEMENT RELEASE DISTAL CLAVICULECTOMY,  ROTATOR CUFF REPAIR AND EXTENSIVE DEBRIDEMENT;  Surgeon: Ninetta Lights, MD;  Location: [facility];  Service: Orthopedics;  Laterality: Right;  . Exam under anesthesia with manipulation of shoulder Right 09/15/2014    Procedure: RIGHT SHOULDER MANIPULATION UNDER ANESTHESIA;  Surgeon: Ninetta Lights, MD;  Location: [place];  Service: Orthopedics;  Laterality: Right;  . Shoulder arthroscopy with rotator cuff repair and  subacromial decompression Left 07/07/2015    Procedure: LEFT SHOULDER SCOPE DEBRIDEMENT, SUBACROMIAL DECOMPRESSION, DISTAL CLAVICULECTOMY, ROTATOR CUFF REPAIR  ;  Surgeon: Kathryne Hitch, MD;  Location: [place];  Service: Orthopedics;  Laterality: Left;  ANESTHESIA: GENERAL, PRE/POST OP SCALENE  . Shoulder arthroscopy with distal clavicle resection Left 07/07/2015    Procedure: SHOULDER ARTHROSCOPY WITH DISTAL CLAVICLE RESECTION;  Surgeon: Kathryne Hitch, MD;  Location: [place];  Service: Orthopedics;  Laterality: Left;    There were no vitals filed for this visit.  Visit Diagnosis:  Left shoulder pain  Shoulder weakness  Shoulder stiffness, left      Subjective Assessment - 09/07/15 1636    Subjective states tape seemed to help in that noted increased pain and difficulty sleeping after he removed tape last night.   Currently in Pain? Yes   Pain Score 4    Pain Location Shoulder   Pain Orientation Left         TODAY'S TREATMENT TherEx - UBE lvl 2.5 90"/90"  Manual - L GH Grade 3 AP with ER, IR, and Flexion mobes; contract/relax into ER  R Side-Lying L shoulder ER, ABD, and Horiz ABD 2# 2x20 each Prayer stretch and L prayer stretch 2x20" each Narrow Pulldown/Flexion stretch 25# 15x Seated L ER Stretch Red TB elbow supported 90 ABD 15x Back to Wall with foam roller along spine B  Shoulder Flexion 2# 20x Standing B Elbows on PBall (65cm) on wall shoulder flexion with scap protraction 10x Standing W Yellow TB 10x Standing Shoulder Flex with contra Ext Yellow TB 10x  4 strips kinesiotape to L Shoulder - 50% lateral delt, 70% subacromial space, 30% ant and post deltoid                   PT Education - 09/07/15 1820    Education provided Yes   Education Details HEP update with flexion focus   Person(s) Educated Patient   Methods Explanation;Demonstration;Handout   Comprehension Verbalized understanding;Returned demonstration           PT Short Term Goals - 08/03/15 0941    PT SHORT TERM GOAL #1   Title L shoulder PROM Flexion to 160, ABD to 150, and ER to 75 at 90 ABD by 08/18/15   Status On-going           PT Long Term Goals - 08/29/15 1818    PT LONG TERM GOAL #1   Title L Shoulder AROM WFL all planes without limit by pain by 09/27/15   Status On-going   PT LONG TERM GOAL #2   Title L Shoulder MMT 4/5 or better all planes by 09/27/15   Status On-going   PT LONG TERM GOAL #3   Title pt able to return to all ADLs, chores, and work duties without limitation by shoulder pain, LOM, or weakness by 09/27/15   Status On-going               Plan - 09/07/15 1816    Clinical Impression Statement restricted flexion AROM (tends to scaption) but is improving.  good tolerance with exercises.  tape seems to help with pain especially with sleeping per pt.   PT Next Visit Plan AROM focus.  Tape prn.  Manual and modalities for pain and ROM.   Consulted and Agree with Plan of Care Patient        Problem List Patient Active Problem List   Diagnosis Date Noted  . Complete rotator cuff tear of left shoulder 07/05/2015  . Tennis elbow 05/30/2014  . Rotator cuff (capsule) sprain 03/10/2014  . Right shoulder pain 02/11/2014  . Memory difficulty 12/18/2012  . Gynecomastia, male 07/30/2012  . OSA (obstructive sleep apnea) 04/23/2011  . EXTERNAL HEMORRHOIDS 02/12/2011  . Prostate cancer 11/12/2010  . PLANTAR FASCIITIS 04/09/2010  . Hypothyroidism 12/08/2009  . NEOPLASM OF UNCERTAIN BEHAVIOR OF SKIN 03/10/2009  . OSTEOARTHRITIS 02/14/2009  . HYPERLIPIDEMIA 02/08/2009  . HEARING DEFICIT 02/08/2009  . Essential hypertension 02/08/2009  . ALLERGIC RHINITIS 02/08/2009  . GERD 02/08/2009  . PROTEINURIA 02/08/2009  . DIVERTICULITIS, HX OF 02/08/2009    Jayne Peckenpaugh PT, OCS 09/07/2015, 6:20 PM  [facility] [address]  [place], [place],  [address] Phone: [phone]   Fax:  [phone]

## 2015-09-12 ENCOUNTER — Ambulatory Visit: Payer: 59 | Admitting: Rehabilitation

## 2015-09-12 DIAGNOSIS — M25612 Stiffness of left shoulder, not elsewhere classified: Secondary | ICD-10-CM

## 2015-09-12 DIAGNOSIS — M25512 Pain in left shoulder: Secondary | ICD-10-CM | POA: Diagnosis not present

## 2015-09-12 DIAGNOSIS — R29898 Other symptoms and signs involving the musculoskeletal system: Secondary | ICD-10-CM

## 2015-09-12 NOTE — Therapy (Signed)
[facility] [address]  [place], [place], [address] Phone: [phone]   Fax:  [phone]  Physical Therapy Treatment  Patient Details  Name: Zachary [name] MRN: [id number] Date of Birth: 1951-07-11 Referring Provider:  Ninetta Lights, MD  Encounter Date: 09/12/2015      PT End of Session - 09/12/15 1612    Visit Number 17   Number of Visits 20   Date for PT Re-Evaluation 09/27/15   PT Start Time 5208   PT Stop Time 1650   PT Time Calculation (min) 40 min      Past Medical History  Diagnosis Date  . Hyperlipidemia   . Hypertension   . Insomnia   . Hypothyroidism   . GERD (gastroesophageal reflux disease)   . Arthritis   . Hemorrhoids   . Hearing problem     hearing deficit  . Diverticulitis   . Wears glasses   . Wears hearing aid     both ears  . Cancer 2015    positive prostate cancer bx  . Sleep apnea     uses a cpap    Past Surgical History  Procedure Laterality Date  . Hemorrhoid surgery  08/2006  . Back surgery  1978    herniated disksurgery  . Tendon repair  June 06, 2011    right elbow, Dr. Percell Miller  . Cardiac catheterization  08/01/2010  . Colonoscopy    . Shoulder arthroscopy with subacromial decompression, rotator cuff repair and bicep tendon repair Right 03/10/2014    Procedure: RIGHT SHOULDER ARTHROSCOPY WITH SUBACROMIAL DECOMPRESSION, PARTIAL ACROMIOPLASTY WITH CORACOAROMIAL LABRUM DEBRIDEMENT RELEASE DISTAL CLAVICULECTOMY,  ROTATOR CUFF REPAIR AND EXTENSIVE DEBRIDEMENT;  Surgeon: Ninetta Lights, MD;  Location: [place];  Service: Orthopedics;  Laterality: Right;  . Exam under anesthesia with manipulation of shoulder Right 09/15/2014    Procedure: RIGHT SHOULDER MANIPULATION UNDER ANESTHESIA;  Surgeon: Ninetta Lights, MD;  Location: [place];  Service: Orthopedics;  Laterality: Right;  . Shoulder arthroscopy with rotator cuff repair and  subacromial decompression Left 07/07/2015    Procedure: LEFT SHOULDER SCOPE DEBRIDEMENT, SUBACROMIAL DECOMPRESSION, DISTAL CLAVICULECTOMY, ROTATOR CUFF REPAIR  ;  Surgeon: Kathryne Hitch, MD;  Location: [place];  Service: Orthopedics;  Laterality: Left;  ANESTHESIA: GENERAL, PRE/POST OP SCALENE  . Shoulder arthroscopy with distal clavicle resection Left 07/07/2015    Procedure: SHOULDER ARTHROSCOPY WITH DISTAL CLAVICLE RESECTION;  Surgeon: Kathryne Hitch, MD;  Location: [place];  Service: Orthopedics;  Laterality: Left;    There were no vitals filed for this visit.  Visit Diagnosis:  Left shoulder pain  Shoulder weakness  Shoulder stiffness, left      Subjective Assessment - 09/12/15 1613    Subjective Reports higher pain levels today for unknown reason. Noted sharp, shooting pain while in a meeting. Tape still seems to be helping.    Currently in Pain? Yes   Pain Score 5    Pain Location Shoulder   Pain Orientation Left   Pain Descriptors / Indicators Sharp;Shooting      TODAY'S TREATMENT TherEx - UBE lvl 2.5 90"/90"  Manual - L GH Grade 3 AP with ER, IR, and Flexion mobes; contract/relax into ER and Flexion  R Side-Lying L shoulder ER, ABD, and Horiz ABD 3# x20 each R Side-Lying L shoulder Flexion 2# x20 Prayer stretch then R and L prayer stretch 3x20" each Standing Shoulder Flex with contra Ext  Yellow TB 12x Standing W Yellow TB 12x Standing B Elbows on PBall (65cm) on wall shoulder flexion with scap protraction 12x Standing PBall (65cm) on wall max shoulder flexion 12x Narrow Pulldown/Flexion stretch 25# 20x Back to Wall with foam roller along spine B Shoulder Flexion 2# 20x       PT Short Term Goals - 08/03/15 0941    PT SHORT TERM GOAL #1   Title L shoulder PROM Flexion to 160, ABD to 150, and ER to 75 at 90 ABD by 08/18/15   Status On-going           PT Long Term Goals - 08/29/15 1818    PT LONG TERM GOAL #1   Title L  Shoulder AROM WFL all planes without limit by pain by 09/27/15   Status On-going   PT LONG TERM GOAL #2   Title L Shoulder MMT 4/5 or better all planes by 09/27/15   Status On-going   PT LONG TERM GOAL #3   Title pt able to return to all ADLs, chores, and work duties without limitation by shoulder pain, LOM, or weakness by 09/27/15   Status On-going               Plan - 09/12/15 1646    Clinical Impression Statement Did not retape today due to pt reporting tape was fine. He would like to reapply tape on Thursday when he comes in. Continued focus on Flexion ROM with exercises today and with contract/relax during manual work.    PT Next Visit Plan AROM focus.  Tape prn.  Manual and modalities for pain and ROM.   Consulted and Agree with Plan of Care Patient        Problem List Patient Active Problem List   Diagnosis Date Noted  . Complete rotator cuff tear of left shoulder 07/05/2015  . Tennis elbow 05/30/2014  . Rotator cuff (capsule) sprain 03/10/2014  . Right shoulder pain 02/11/2014  . Memory difficulty 12/18/2012  . Gynecomastia, male 07/30/2012  . OSA (obstructive sleep apnea) 04/23/2011  . EXTERNAL HEMORRHOIDS 02/12/2011  . Prostate cancer 11/12/2010  . PLANTAR FASCIITIS 04/09/2010  . Hypothyroidism 12/08/2009  . NEOPLASM OF UNCERTAIN BEHAVIOR OF SKIN 03/10/2009  . OSTEOARTHRITIS 02/14/2009  . HYPERLIPIDEMIA 02/08/2009  . HEARING DEFICIT 02/08/2009  . Essential hypertension 02/08/2009  . ALLERGIC RHINITIS 02/08/2009  . GERD 02/08/2009  . PROTEINURIA 02/08/2009  . DIVERTICULITIS, HX OF 02/08/2009    Barbette Hair, PTA 09/12/2015, 4:49 PM  [facility] [address]  [place], [place], [address] Phone: [phone]   Fax:  [phone]

## 2015-09-13 ENCOUNTER — Other Ambulatory Visit: Payer: Self-pay | Admitting: Family Medicine

## 2015-09-14 ENCOUNTER — Ambulatory Visit: Payer: 59 | Admitting: Physical Therapy

## 2015-09-14 DIAGNOSIS — R29898 Other symptoms and signs involving the musculoskeletal system: Secondary | ICD-10-CM

## 2015-09-14 DIAGNOSIS — M25512 Pain in left shoulder: Secondary | ICD-10-CM | POA: Diagnosis not present

## 2015-09-14 DIAGNOSIS — M25612 Stiffness of left shoulder, not elsewhere classified: Secondary | ICD-10-CM

## 2015-09-14 MED ORDER — PRAVASTATIN SODIUM 40 MG PO TABS: 20.0000 mg | ORAL_TABLET | Freq: Every day | ORAL | Status: DC

## 2015-09-14 NOTE — Therapy (Signed)
[facility] [address]  [place], [place], [address] Phone: [phone]   Fax:  [phone]  Physical Therapy Treatment  Patient Details  Name: Zachary [name] MRN: [id number] Date of Birth: January 02, 1951 Referring Provider:  Ninetta Lights, MD  Encounter Date: 09/14/2015      PT End of Session - 09/14/15 1629    Visit Number 18   Number of Visits 20   Date for PT Re-Evaluation 09/27/15   PT Start Time 1622   PT Stop Time 1704   PT Time Calculation (min) 42 min      Past Medical History  Diagnosis Date  . Hyperlipidemia   . Hypertension   . Insomnia   . Hypothyroidism   . GERD (gastroesophageal reflux disease)   . Arthritis   . Hemorrhoids   . Hearing problem     hearing deficit  . Diverticulitis   . Wears glasses   . Wears hearing aid     both ears  . Cancer 2015    positive prostate cancer bx  . Sleep apnea     uses a cpap    Past Surgical History  Procedure Laterality Date  . Hemorrhoid surgery  08/2006  . Back surgery  1978    herniated disksurgery  . Tendon repair  June 06, 2011    right elbow, Dr. Percell Miller  . Cardiac catheterization  08/01/2010  . Colonoscopy    . Shoulder arthroscopy with subacromial decompression, rotator cuff repair and bicep tendon repair Right 03/10/2014    Procedure: RIGHT SHOULDER ARTHROSCOPY WITH SUBACROMIAL DECOMPRESSION, PARTIAL ACROMIOPLASTY WITH CORACOAROMIAL LABRUM DEBRIDEMENT RELEASE DISTAL CLAVICULECTOMY,  ROTATOR CUFF REPAIR AND EXTENSIVE DEBRIDEMENT;  Surgeon: Ninetta Lights, MD;  Location: [facility];  Service: Orthopedics;  Laterality: Right;  . Exam under anesthesia with manipulation of shoulder Right 09/15/2014    Procedure: RIGHT SHOULDER MANIPULATION UNDER ANESTHESIA;  Surgeon: Ninetta Lights, MD;  Location: [facility];  Service: Orthopedics;  Laterality: Right;  . Shoulder arthroscopy with rotator cuff repair and  subacromial decompression Left 07/07/2015    Procedure: LEFT SHOULDER SCOPE DEBRIDEMENT, SUBACROMIAL DECOMPRESSION, DISTAL CLAVICULECTOMY, ROTATOR CUFF REPAIR  ;  Surgeon: Kathryne Hitch, MD;  Location: [facility];  Service: Orthopedics;  Laterality: Left;  ANESTHESIA: GENERAL, PRE/POST OP SCALENE  . Shoulder arthroscopy with distal clavicle resection Left 07/07/2015    Procedure: SHOULDER ARTHROSCOPY WITH DISTAL CLAVICLE RESECTION;  Surgeon: Kathryne Hitch, MD;  Location: [facility];  Service: Orthopedics;  Laterality: Left;    There were no vitals filed for this visit.  Visit Diagnosis:  Left shoulder pain  Shoulder weakness  Shoulder stiffness, left      Subjective Assessment - 09/14/15 1624    Subjective states is improving with AVG pain 4/10 lately but still difficulty sleeping due to shoulder pain waking him.  Only taking pain meds at night, none during the day.  Is taking Aleve PM, no prescribed pain meds needed per pt. Is on light duty until 31Oct2016.   Currently in Pain? Yes   Pain Score 4   AVG pain has been 4/10 lately, worst pain up to 6-7/10 - short duration sharp pains   Pain Location Shoulder   Pain Orientation Left   Pain Frequency Intermittent   Aggravating Factors  still difficulty sleeping due to pain            [facility] PT Assessment - 09/14/15 0001  PROM   Left Shoulder Internal Rotation 70 Degrees   Left Shoulder External Rotation 82 Degrees    TODAY'S TREATMENT Manual - L GH Grade 3 AP with ER, IR, and Flexion mobes; contract/relax into ER TherEx -  R Side-Lying L shoulder ER and ABD 3# 20x each Towel IR Stretch R Side-Lying L shoulder IR 1# 10x BodyBlade L ER 2x25", 15"; ABD 2x20", 10" High Row 25# 10x (excessive scapular elevation) Low Row 35# 15x (good form with TC) Corner Stretch 3x20" Standing W Yellow TB 15x Hooklying Pullover 6# 15x                           PT Short Term Goals - 08/03/15  0941    PT SHORT TERM GOAL #1   Title L shoulder PROM Flexion to 160, ABD to 150, and ER to 75 at 90 ABD by 08/18/15   Status On-going           PT Long Term Goals - 09/14/15 1630    PT LONG TERM GOAL #3   Title pt able to return to all ADLs, chores, and work duties without limitation by shoulder pain, LOM, or weakness by 09/27/15  still having difficult with lifting objects from ground and significant difficulty with overhead activities               Plan - 09/14/15 1708    Clinical Impression Statement continued slow but persistent progress with ER PROM, no improvment noted into IR however.  Pt still noting difficulty with work due to weakness in shoulder.  Is on light duty so does not have to lift but does try from time to time.   PT Next Visit Plan R shoulder stretching especially into IR; strengthing and stability all planes   Consulted and Agree with Plan of Care Patient        Problem List Patient Active Problem List   Diagnosis Date Noted  . Complete rotator cuff tear of left shoulder 07/05/2015  . Tennis elbow 05/30/2014  . Rotator cuff (capsule) sprain 03/10/2014  . Right shoulder pain 02/11/2014  . Memory difficulty 12/18/2012  . Gynecomastia, male 07/30/2012  . OSA (obstructive sleep apnea) 04/23/2011  . EXTERNAL HEMORRHOIDS 02/12/2011  . Prostate cancer 11/12/2010  . PLANTAR FASCIITIS 04/09/2010  . Hypothyroidism 12/08/2009  . NEOPLASM OF UNCERTAIN BEHAVIOR OF SKIN 03/10/2009  . OSTEOARTHRITIS 02/14/2009  . HYPERLIPIDEMIA 02/08/2009  . HEARING DEFICIT 02/08/2009  . Essential hypertension 02/08/2009  . ALLERGIC RHINITIS 02/08/2009  . GERD 02/08/2009  . PROTEINURIA 02/08/2009  . DIVERTICULITIS, HX OF 02/08/2009    HALL,RALPH PT, OCS 09/14/2015, 5:10 PM  [facility] [address]  [place], [place], [address] Phone: [phone]   Fax:  [phone]

## 2015-09-19 ENCOUNTER — Ambulatory Visit: Payer: 59 | Admitting: Rehabilitation

## 2015-09-19 DIAGNOSIS — M25512 Pain in left shoulder: Secondary | ICD-10-CM | POA: Diagnosis not present

## 2015-09-19 DIAGNOSIS — R29898 Other symptoms and signs involving the musculoskeletal system: Secondary | ICD-10-CM

## 2015-09-19 DIAGNOSIS — M25612 Stiffness of left shoulder, not elsewhere classified: Secondary | ICD-10-CM

## 2015-09-19 NOTE — Therapy (Signed)
[facility] [address]  [place], [place], [address] Phone: [phone]   Fax:  [phone]  Physical Therapy Treatment  Patient Details  Name: Zachary [name] MRN: [id number] Date of Birth: 1951/01/31 Referring Provider:  Ninetta Lights, MD  Encounter Date: 09/19/2015      PT End of Session - 09/19/15 1611    Visit Number 19   Number of Visits 20   Date for PT Re-Evaluation 09/27/15   PT Start Time 0174   PT Stop Time 1655   PT Time Calculation (min) 40 min      Past Medical History  Diagnosis Date  . Hyperlipidemia   . Hypertension   . Insomnia   . Hypothyroidism   . GERD (gastroesophageal reflux disease)   . Arthritis   . Hemorrhoids   . Hearing problem     hearing deficit  . Diverticulitis   . Wears glasses   . Wears hearing aid     both ears  . Cancer 2015    positive prostate cancer bx  . Sleep apnea     uses a cpap    Past Surgical History  Procedure Laterality Date  . Hemorrhoid surgery  08/2006  . Back surgery  1978    herniated disksurgery  . Tendon repair  June 06, 2011    right elbow, Dr. Percell Miller  . Cardiac catheterization  08/01/2010  . Colonoscopy    . Shoulder arthroscopy with subacromial decompression, rotator cuff repair and bicep tendon repair Right 03/10/2014    Procedure: RIGHT SHOULDER ARTHROSCOPY WITH SUBACROMIAL DECOMPRESSION, PARTIAL ACROMIOPLASTY WITH CORACOAROMIAL LABRUM DEBRIDEMENT RELEASE DISTAL CLAVICULECTOMY,  ROTATOR CUFF REPAIR AND EXTENSIVE DEBRIDEMENT;  Surgeon: Ninetta Lights, MD;  Location: [facility];  Service: Orthopedics;  Laterality: Right;  . Exam under anesthesia with manipulation of shoulder Right 09/15/2014    Procedure: RIGHT SHOULDER MANIPULATION UNDER ANESTHESIA;  Surgeon: Ninetta Lights, MD;  Location: [facility];  Service: Orthopedics;  Laterality: Right;  . Shoulder arthroscopy with rotator cuff repair and  subacromial decompression Left 07/07/2015    Procedure: LEFT SHOULDER SCOPE DEBRIDEMENT, SUBACROMIAL DECOMPRESSION, DISTAL CLAVICULECTOMY, ROTATOR CUFF REPAIR  ;  Surgeon: Kathryne Hitch, MD;  Location: [facility];  Service: Orthopedics;  Laterality: Left;  ANESTHESIA: GENERAL, PRE/POST OP SCALENE  . Shoulder arthroscopy with distal clavicle resection Left 07/07/2015    Procedure: SHOULDER ARTHROSCOPY WITH DISTAL CLAVICLE RESECTION;  Surgeon: Kathryne Hitch, MD;  Location: [facility];  Service: Orthopedics;  Laterality: Left;    There were no vitals filed for this visit.  Visit Diagnosis:  Left shoulder pain  Shoulder weakness  Shoulder stiffness, left      Subjective Assessment - 09/19/15 1616    Subjective Reports shoulder still bothers him and keeps him up at night.    Currently in Pain? Yes   Pain Score 3    Pain Location Shoulder   Pain Orientation Left      TODAY'S TREATMENT  Manual -  L GH Grade 3 AP with ER, IR, and Flexion mobes, PROM into Flexion, IR, and ER to pt tolerance  TherEx -  Hooklying Pullover 6# 15x R Side-Lying L shoulder ER and ABD 4# 15x each R Side-Lying L shoulder Flexion 3# x15 Corner Stretch 3x20" Towel IR Stretch 5x10" BodyBlade Bil Flexion 2x20"; Lt ER 2x30"; ABD 2x20", 10"  Standing W Yellow TB 15x Standing Bent T Yellow TB 10x  Standing Alt UE Flexion and Extension Yellow TB 12x Low Row 35# 15x (good form with TC)  Lt Shoulder PROM Measurements: Flexion and Abduction 160, IR 75, ER 85      PT Short Term Goals - 09/19/15 1626    PT SHORT TERM GOAL #1   Title L shoulder PROM Flexion to 160, ABD to 150, and ER to 75 at 90 ABD by 08/18/15   Status Achieved           PT Long Term Goals - 09/19/15 1625    PT LONG TERM GOAL #1   Title L Shoulder AROM WFL all planes without limit by pain by 09/27/15   Status On-going   PT LONG TERM GOAL #2   Title L Shoulder MMT 4/5 or better all planes by 09/27/15   Status  On-going   PT LONG TERM GOAL #3   Title pt able to return to all ADLs, chores, and work duties without limitation by shoulder pain, LOM, or weakness by 09/27/15   Status On-going               Plan - 09/19/15 1654    Clinical Impression Statement Good tolerance to manual work and exercises. Did not perform contract/relax today due to good improvements with joint mobs and PROM into flexion and ER. Still weakness noted in shoulder especially with standing exercises.     PT Next Visit Plan R shoulder stretching especially into IR; strengthing and stability all planes   Consulted and Agree with Plan of Care Patient        Problem List Patient Active Problem List   Diagnosis Date Noted  . Complete rotator cuff tear of left shoulder 07/05/2015  . Tennis elbow 05/30/2014  . Rotator cuff (capsule) sprain 03/10/2014  . Right shoulder pain 02/11/2014  . Memory difficulty 12/18/2012  . Gynecomastia, male 07/30/2012  . OSA (obstructive sleep apnea) 04/23/2011  . EXTERNAL HEMORRHOIDS 02/12/2011  . Prostate cancer 11/12/2010  . PLANTAR FASCIITIS 04/09/2010  . Hypothyroidism 12/08/2009  . NEOPLASM OF UNCERTAIN BEHAVIOR OF SKIN 03/10/2009  . OSTEOARTHRITIS 02/14/2009  . HYPERLIPIDEMIA 02/08/2009  . HEARING DEFICIT 02/08/2009  . Essential hypertension 02/08/2009  . ALLERGIC RHINITIS 02/08/2009  . GERD 02/08/2009  . PROTEINURIA 02/08/2009  . DIVERTICULITIS, HX OF 02/08/2009    Barbette Hair, PTA 09/19/2015, 4:56 PM  [facility] [address]  [place], [place], [address] Phone: [phone]   Fax:  [phone]

## 2015-09-21 ENCOUNTER — Ambulatory Visit: Payer: 59 | Admitting: Physical Therapy

## 2015-09-21 DIAGNOSIS — M25512 Pain in left shoulder: Secondary | ICD-10-CM | POA: Diagnosis not present

## 2015-09-21 DIAGNOSIS — R29898 Other symptoms and signs involving the musculoskeletal system: Secondary | ICD-10-CM

## 2015-09-21 DIAGNOSIS — M25612 Stiffness of left shoulder, not elsewhere classified: Secondary | ICD-10-CM

## 2015-09-21 NOTE — Therapy (Signed)
[facility] [address]  [place], [place], [address] Phone: [phone]   Fax:  [phone]  Physical Therapy Treatment  Patient Details  Name: Zachary [name] MRN: [id number] Date of Birth: 02-08-1951 Referring Provider:  Ninetta Lights, MD  Encounter Date: 09/21/2015      PT End of Session - 09/21/15 1624    Visit Number 20   Number of Visits 28   Date for PT Re-Evaluation 10/19/15   PT Start Time 1623   PT Stop Time 1700   PT Time Calculation (min) 37 min      Past Medical History  Diagnosis Date  . Hyperlipidemia   . Hypertension   . Insomnia   . Hypothyroidism   . GERD (gastroesophageal reflux disease)   . Arthritis   . Hemorrhoids   . Hearing problem     hearing deficit  . Diverticulitis   . Wears glasses   . Wears hearing aid     both ears  . Cancer 2015    positive prostate cancer bx  . Sleep apnea     uses a cpap    Past Surgical History  Procedure Laterality Date  . Hemorrhoid surgery  08/2006  . Back surgery  1978    herniated disksurgery  . Tendon repair  June 06, 2011    right elbow, Dr. Percell Miller  . Cardiac catheterization  08/01/2010  . Colonoscopy    . Shoulder arthroscopy with subacromial decompression, rotator cuff repair and bicep tendon repair Right 03/10/2014    Procedure: RIGHT SHOULDER ARTHROSCOPY WITH SUBACROMIAL DECOMPRESSION, PARTIAL ACROMIOPLASTY WITH CORACOAROMIAL LABRUM DEBRIDEMENT RELEASE DISTAL CLAVICULECTOMY,  ROTATOR CUFF REPAIR AND EXTENSIVE DEBRIDEMENT;  Surgeon: Ninetta Lights, MD;  Location: [facility];  Service: Orthopedics;  Laterality: Right;  . Exam under anesthesia with manipulation of shoulder Right 09/15/2014    Procedure: RIGHT SHOULDER MANIPULATION UNDER ANESTHESIA;  Surgeon: Ninetta Lights, MD;  Location: [facility];  Service: Orthopedics;  Laterality: Right;  . Shoulder arthroscopy with rotator cuff repair and  subacromial decompression Left 07/07/2015    Procedure: LEFT SHOULDER SCOPE DEBRIDEMENT, SUBACROMIAL DECOMPRESSION, DISTAL CLAVICULECTOMY, ROTATOR CUFF REPAIR  ;  Surgeon: Kathryne Hitch, MD;  Location: [facility];  Service: Orthopedics;  Laterality: Left;  ANESTHESIA: GENERAL, PRE/POST OP SCALENE  . Shoulder arthroscopy with distal clavicle resection Left 07/07/2015    Procedure: SHOULDER ARTHROSCOPY WITH DISTAL CLAVICLE RESECTION;  Surgeon: Kathryne Hitch, MD;  Location: [facility];  Service: Orthopedics;  Laterality: Left;    There were no vitals filed for this visit.  Visit Diagnosis:  Left shoulder pain  Shoulder weakness  Shoulder stiffness, left      Subjective Assessment - 09/21/15 1624    Subjective states notes intermittent random sharp pains in L shoulder throughout the day and continues to note intermittent difficulty with sleeping approx 4 nights/week.   Currently in Pain? Yes   Pain Score --  1-2/10 currently; rates AVG pain 4/10 throughout the day and up to 6/10 at worst   Pain Location Shoulder   Pain Orientation Left;Upper;Lateral            [facility] PT Assessment - 09/21/15 0001    ROM / Strength   AROM / PROM / Strength Strength   AROM   AROM Assessment Site Shoulder   Right/Left Shoulder Right   Right Shoulder Flexion 156 Degrees   Right Shoulder ABduction 160 Degrees  Left Shoulder Flexion 142 Degrees   Left Shoulder ABduction 130 Degrees   Left Shoulder Internal Rotation 42 Degrees  reach to T12 (R to T9)   Left Shoulder External Rotation 72 Degrees  reach to T3 (R to T4)   Strength   Strength Assessment Site Shoulder   Right/Left Shoulder Left   Left Shoulder Flexion 4/5   Left Shoulder ABduction 4-/5   Left Shoulder Internal Rotation 4/5   Left Shoulder External Rotation 4/5            TODAY'S TREATMENT L Shoulder ROM and MMT assessment TherEx - BodyBlade 2x20" each: B flexion, L ER, L ABD (all neutral  shoulder) TRX Low and High Row 10x each TRX FW Lean shoulder stability drills: shoulder flex with contra ext, B CW, B CCW Standing pball on wall with elbows on pball (55cm) scap protraction with B shoulder flexion 2x15 Standing back to wall with 6" Foam Roll along spine: B Shoulder Flexion 1# 15x; B shoulder circles in 90 ABD 15x CW/CCW Standing L Shoulder ER walkout isometric at 90 ABD with Red TB 10x                   PT Short Term Goals - 09/19/15 1626    PT SHORT TERM GOAL #1   Title L shoulder PROM Flexion to 160, ABD to 150, and ER to 75 at 90 ABD by 08/18/15   Status Achieved           PT Long Term Goals - 09/21/15 1755    PT LONG TERM GOAL #1   Title L Shoulder AROM WFL all planes without limit by pain by 10/19/15   Status On-going   PT LONG TERM GOAL #2   Title L Shoulder MMT 4/5 or better all planes by 10/19/15   Status On-going   PT LONG TERM GOAL #3   Title pt able to return to all ADLs, chores, and work duties without limitation by shoulder pain, LOM, or weakness by 10/19/15   Status On-going               Plan - 09/21/15 1756    Clinical Impression Statement Zachary [name] has progressed well with his ROM values but is still weak and has poor scapular control against resistance with flexion or abduction above 90 degrees.  His pain values vary from treatment to treatment (today was 1-2/10) and he repots his average pain is 4/10 throughout the day and worst pain 6/10.  He states he has difficulty sleeping approx 4 nights/week due to pain.  He has returned to work on light duty and is currently scheduled to continue on light duty until the end of October.  He is compliant with HEP and works hard during PT sessions.  I anticiate he should meet his goals within the next 2-4 wks   Pt will benefit from skilled therapeutic intervention in order to improve on the following deficits Pain;Postural dysfunction;Decreased strength;Decreased mobility;Improper body  mechanics;Decreased range of motion   PT Frequency 2x / week   PT Duration 4 weeks   PT Treatment/Interventions Manual techniques;Therapeutic exercise;Therapeutic activities;Patient/family education;Vasopneumatic Device;Taping;Electrical Stimulation;Cryotherapy   PT Next Visit Plan R shoulder AROM; strengthing and stability all planes   Consulted and Agree with Plan of Care Patient        Problem List Patient Active Problem List   Diagnosis Date Noted  . Complete rotator cuff tear of left shoulder 07/05/2015  . Tennis elbow 05/30/2014  . Rotator  cuff (capsule) sprain 03/10/2014  . Right shoulder pain 02/11/2014  . Memory difficulty 12/18/2012  . Gynecomastia, male 07/30/2012  . OSA (obstructive sleep apnea) 04/23/2011  . EXTERNAL HEMORRHOIDS 02/12/2011  . Prostate cancer 11/12/2010  . PLANTAR FASCIITIS 04/09/2010  . Hypothyroidism 12/08/2009  . NEOPLASM OF UNCERTAIN BEHAVIOR OF SKIN 03/10/2009  . OSTEOARTHRITIS 02/14/2009  . HYPERLIPIDEMIA 02/08/2009  . HEARING DEFICIT 02/08/2009  . Essential hypertension 02/08/2009  . ALLERGIC RHINITIS 02/08/2009  . GERD 02/08/2009  . PROTEINURIA 02/08/2009  . DIVERTICULITIS, HX OF 02/08/2009    [name],Zachary PT, OCS 09/21/2015, 6:01 PM  [facility] [place], [place], [address] Phone: [phone]   Fax:  [phone]

## 2015-09-26 ENCOUNTER — Ambulatory Visit: Payer: 59 | Attending: Orthopedic Surgery | Admitting: Rehabilitation

## 2015-09-26 DIAGNOSIS — M25612 Stiffness of left shoulder, not elsewhere classified: Secondary | ICD-10-CM | POA: Insufficient documentation

## 2015-09-26 DIAGNOSIS — M25512 Pain in left shoulder: Secondary | ICD-10-CM | POA: Insufficient documentation

## 2015-09-26 DIAGNOSIS — R29898 Other symptoms and signs involving the musculoskeletal system: Secondary | ICD-10-CM | POA: Insufficient documentation

## 2015-09-26 NOTE — Therapy (Signed)
[facility] [address]  [place], [place], [address] Phone: [phone]   Fax:  [phone]  Physical Therapy Treatment  Patient Details  Name: Zachary [name] MRN: [id number] Date of Birth: [birth year] Referring Provider:  Ninetta Lights, MD  Encounter Date: 09/26/2015      PT End of Session - 09/26/15 1620    Visit Number 21   Number of Visits 28   Date for PT Re-Evaluation 10/19/15   PT Start Time 8546   PT Stop Time 1655   PT Time Calculation (min) 38 min      Past Medical History  Diagnosis Date  . Hyperlipidemia   . Hypertension   . Insomnia   . Hypothyroidism   . GERD (gastroesophageal reflux disease)   . Arthritis   . Hemorrhoids   . Hearing problem     hearing deficit  . Diverticulitis   . Wears glasses   . Wears hearing aid     both ears  . Cancer 2015    positive prostate cancer bx  . Sleep apnea     uses a cpap    Past Surgical History  Procedure Laterality Date  . Hemorrhoid surgery  08/2006  . Back surgery  1978    herniated disksurgery  . Tendon repair  June 06, 2011    right elbow, Dr. Percell Miller  . Cardiac catheterization  08/01/2010  . Colonoscopy    . Shoulder arthroscopy with subacromial decompression, rotator cuff repair and bicep tendon repair Right 03/10/2014    Procedure: RIGHT SHOULDER ARTHROSCOPY WITH SUBACROMIAL DECOMPRESSION, PARTIAL ACROMIOPLASTY WITH CORACOAROMIAL LABRUM DEBRIDEMENT RELEASE DISTAL CLAVICULECTOMY,  ROTATOR CUFF REPAIR AND EXTENSIVE DEBRIDEMENT;  Surgeon: Ninetta Lights, MD;  Location: [facility];  Service: Orthopedics;  Laterality: Right;  . Exam under anesthesia with manipulation of shoulder Right 09/15/2014    Procedure: RIGHT SHOULDER MANIPULATION UNDER ANESTHESIA;  Surgeon: Ninetta Lights, MD;  Location: [facility];  Service: Orthopedics;  Laterality: Right;  . Shoulder arthroscopy with rotator cuff repair and  subacromial decompression Left 07/07/2015    Procedure: LEFT SHOULDER SCOPE DEBRIDEMENT, SUBACROMIAL DECOMPRESSION, DISTAL CLAVICULECTOMY, ROTATOR CUFF REPAIR  ;  Surgeon: Kathryne Hitch, MD;  Location: [facility];  Service: Orthopedics;  Laterality: Left;  ANESTHESIA: GENERAL, PRE/POST OP SCALENE  . Shoulder arthroscopy with distal clavicle resection Left 07/07/2015    Procedure: SHOULDER ARTHROSCOPY WITH DISTAL CLAVICLE RESECTION;  Surgeon: Kathryne Hitch, MD;  Location: [facility];  Service: Orthopedics;  Laterality: Left;    There were no vitals filed for this visit.  Visit Diagnosis:  Left shoulder pain  Shoulder weakness  Shoulder stiffness, left      Subjective Assessment - 09/26/15 1628    Subjective Shoulder has been feeling ok today. Notes most difficulty with sleeping.    Currently in Pain? Yes   Pain Score 2    Pain Location Shoulder   Pain Orientation Left;Upper;Lateral          TODAY'S TREATMENT UBE level 3.0 90"/90" Standing pball on wall with elbows on pball (55cm) scap protraction with B shoulder flexion 2x15 BodyBlade 2x20" each: B flexion, L ER, L ABD (all neutral shoulder) Towel IR Stretch 10x10" TRX Low and High Row 12x each Standing back to wall with 6" Foam Roll along spine: B Shoulder Flexion 1# 15x; B scaption 1# 10x Circles with small ball on wall 90 degrees flexion 15x CW/CCW, 90 abduction  15x CW/CCW Standing W Red TB 15x Corner Stretch 3x20" Standing L Shoulder ER walkout isometric at 90 ABD with Red TB 10x Standing Alt UE Flexion and Extension Yellow TB 15x        PT Short Term Goals - 09/19/15 1626    PT SHORT TERM GOAL #1   Title L shoulder PROM Flexion to 160, ABD to 150, and ER to 75 at 90 ABD by 08/18/15   Status Achieved           PT Long Term Goals - 09/21/15 1755    PT LONG TERM GOAL #1   Title L Shoulder AROM WFL all planes without limit by pain by 10/19/15   Status On-going   PT LONG TERM GOAL  #2   Title L Shoulder MMT 4/5 or better all planes by 10/19/15   Status On-going   PT LONG TERM GOAL #3   Title pt able to return to all ADLs, chores, and work duties without limitation by shoulder pain, LOM, or weakness by 10/19/15   Status On-going               Plan - 09/26/15 1655    Clinical Impression Statement Pt demonstrated good tolerance to all exericses but quickly fatigues with overhead movements. Needs TC to prevent shoulder hike with high row and alternating flexion and extension exercise. Pt did demonstrate good tolerance to increased reps and resistance today.    PT Next Visit Plan R shoulder AROM; strengthing and stability all planes   Consulted and Agree with Plan of Care Patient        Problem List Patient Active Problem List   Diagnosis Date Noted  . Complete rotator cuff tear of left shoulder 07/05/2015  . Tennis elbow 05/30/2014  . Rotator cuff (capsule) sprain 03/10/2014  . Right shoulder pain 02/11/2014  . Memory difficulty 12/18/2012  . Gynecomastia, male 07/30/2012  . OSA (obstructive sleep apnea) 04/23/2011  . EXTERNAL HEMORRHOIDS 02/12/2011  . Prostate cancer ([place]) 11/12/2010  . PLANTAR FASCIITIS 04/09/2010  . Hypothyroidism 12/08/2009  . NEOPLASM OF UNCERTAIN BEHAVIOR OF SKIN 03/10/2009  . OSTEOARTHRITIS 02/14/2009  . HYPERLIPIDEMIA 02/08/2009  . HEARING DEFICIT 02/08/2009  . Essential hypertension 02/08/2009  . ALLERGIC RHINITIS 02/08/2009  . GERD 02/08/2009  . PROTEINURIA 02/08/2009  . DIVERTICULITIS, HX OF 02/08/2009    Barbette Hair, PTA 09/26/2015, 5:01 PM  [facility] [address]  [place], [place], [address] Phone: [phone]   Fax:  [phone]

## 2015-09-27 ENCOUNTER — Other Ambulatory Visit: Payer: Self-pay | Admitting: Family Medicine

## 2015-09-28 ENCOUNTER — Ambulatory Visit: Payer: 59 | Admitting: Physical Therapy

## 2015-09-28 DIAGNOSIS — M25612 Stiffness of left shoulder, not elsewhere classified: Secondary | ICD-10-CM

## 2015-09-28 DIAGNOSIS — R29898 Other symptoms and signs involving the musculoskeletal system: Secondary | ICD-10-CM

## 2015-09-28 DIAGNOSIS — M25512 Pain in left shoulder: Secondary | ICD-10-CM

## 2015-09-28 NOTE — Therapy (Signed)
[facility] [address]  [place], [place], [address] Phone: [phone]   Fax:  [phone]  Physical Therapy Treatment  Patient Details  Name: Zachary [name] MRN: [id number] Date of Birth: 09/29/1951 Referring Provider:  Ninetta Lights, MD  Encounter Date: 09/28/2015      PT End of Session - 09/28/15 0937    Visit Number 22   Number of Visits 28   Date for PT Re-Evaluation 10/19/15   PT Start Time 0935   PT Stop Time 1026   PT Time Calculation (min) 51 min      Past Medical History  Diagnosis Date  . Hyperlipidemia   . Hypertension   . Insomnia   . Hypothyroidism   . GERD (gastroesophageal reflux disease)   . Arthritis   . Hemorrhoids   . Hearing problem     hearing deficit  . Diverticulitis   . Wears glasses   . Wears hearing aid     both ears  . Cancer 2015    positive prostate cancer bx  . Sleep apnea     uses a cpap    Past Surgical History  Procedure Laterality Date  . Hemorrhoid surgery  08/2006  . Back surgery  1978    herniated disksurgery  . Tendon repair  June 06, 2011    right elbow, Dr. Percell Miller  . Cardiac catheterization  08/01/2010  . Colonoscopy    . Shoulder arthroscopy with subacromial decompression, rotator cuff repair and bicep tendon repair Right 03/10/2014    Procedure: RIGHT SHOULDER ARTHROSCOPY WITH SUBACROMIAL DECOMPRESSION, PARTIAL ACROMIOPLASTY WITH CORACOAROMIAL LABRUM DEBRIDEMENT RELEASE DISTAL CLAVICULECTOMY,  ROTATOR CUFF REPAIR AND EXTENSIVE DEBRIDEMENT;  Surgeon: Ninetta Lights, MD;  Location: [place];  Service: Orthopedics;  Laterality: Right;  . Exam under anesthesia with manipulation of shoulder Right 09/15/2014    Procedure: RIGHT SHOULDER MANIPULATION UNDER ANESTHESIA;  Surgeon: Ninetta Lights, MD;  Location: [place];  Service: Orthopedics;  Laterality: Right;  . Shoulder arthroscopy with rotator cuff repair and  subacromial decompression Left 07/07/2015    Procedure: LEFT SHOULDER SCOPE DEBRIDEMENT, SUBACROMIAL DECOMPRESSION, DISTAL CLAVICULECTOMY, ROTATOR CUFF REPAIR  ;  Surgeon: Kathryne Hitch, MD;  Location: [place];  Service: Orthopedics;  Laterality: Left;  ANESTHESIA: GENERAL, PRE/POST OP SCALENE  . Shoulder arthroscopy with distal clavicle resection Left 07/07/2015    Procedure: SHOULDER ARTHROSCOPY WITH DISTAL CLAVICLE RESECTION;  Surgeon: Kathryne Hitch, MD;  Location: [place];  Service: Orthopedics;  Laterality: Left;    There were no vitals filed for this visit.  Visit Diagnosis:  Left shoulder pain  Shoulder weakness  Shoulder stiffness, left      Subjective Assessment - 09/28/15 0937    Subjective had more pain yesterday all day for unknown reason stating was worst it had been in past 2 weeks.  Pain with raising arm.  Requires meds to sleep.  Pain up to 6-7/10 yesterday and down to 5/10.   Currently in Pain? Yes   Pain Score 5    Pain Location Shoulder   Pain Orientation Left;Upper;Lateral          TODAY'S TREATMENT Manual - R GH AP and Caudal glides grade 2 with increasing PROM to tolerance (focus on decreasing pain today)  TherEx - supine serratus press 6# 15x Supine CW/CCW 6# 15x each in 90 Flex R Side-Lying CW/CCW 6# 15x each in 90 ABD R Side-Lying L  ER 2# 20x Kinesiotape: 4 strips - 75% subacromial space, 50% anterior, posterior, and lateral delt to UT  Vasopneumatic Compression - low pressure, L shoulder, 38dg, 15'                         PT Short Term Goals - 09/19/15 1626    PT SHORT TERM GOAL #1   Title L shoulder PROM Flexion to 160, ABD to 150, and ER to 75 at 90 ABD by 08/18/15   Status Achieved           PT Long Term Goals - 09/21/15 1755    PT LONG TERM GOAL #1   Title L Shoulder AROM WFL all planes without limit by pain by 10/19/15   Status On-going   PT LONG TERM GOAL #2   Title L Shoulder  MMT 4/5 or better all planes by 10/19/15   Status On-going   PT LONG TERM GOAL #3   Title pt able to return to all ADLs, chores, and work duties without limitation by shoulder pain, LOM, or weakness by 10/19/15   Status On-going               Plan - 09/28/15 1012    Clinical Impression Statement pt with significant increase in pain past 2 days for unknown reason so today's treatment focused on reducing pain for most part.  Performed prolonged light mobes with increasing PROM and was able to achieve past values in all planes.  Applied kinesiotape and use vaso today to further assist with pain reduction. Pt to MD next week.   PT Next Visit Plan PR to MD next week.   Consulted and Agree with Plan of Care Patient        Problem List Patient Active Problem List   Diagnosis Date Noted  . Complete rotator cuff tear of left shoulder 07/05/2015  . Tennis elbow 05/30/2014  . Rotator cuff (capsule) sprain 03/10/2014  . Right shoulder pain 02/11/2014  . Memory difficulty 12/18/2012  . Gynecomastia, male 07/30/2012  . OSA (obstructive sleep apnea) 04/23/2011  . EXTERNAL HEMORRHOIDS 02/12/2011  . Prostate cancer ([place]) 11/12/2010  . PLANTAR FASCIITIS 04/09/2010  . Hypothyroidism 12/08/2009  . NEOPLASM OF UNCERTAIN BEHAVIOR OF SKIN 03/10/2009  . OSTEOARTHRITIS 02/14/2009  . HYPERLIPIDEMIA 02/08/2009  . HEARING DEFICIT 02/08/2009  . Essential hypertension 02/08/2009  . ALLERGIC RHINITIS 02/08/2009  . GERD 02/08/2009  . PROTEINURIA 02/08/2009  . DIVERTICULITIS, HX OF 02/08/2009    Kinley Ferrentino PT, OCS 09/28/2015, 10:36 AM  [facility] [place], [place], [address] Phone: [phone]   Fax:  [phone]

## 2015-10-03 ENCOUNTER — Ambulatory Visit: Payer: 59 | Admitting: Physical Therapy

## 2015-10-05 ENCOUNTER — Ambulatory Visit: Payer: 59 | Admitting: Physical Therapy

## 2015-10-10 ENCOUNTER — Ambulatory Visit: Payer: 59 | Admitting: Physical Therapy

## 2015-10-10 DIAGNOSIS — M25512 Pain in left shoulder: Secondary | ICD-10-CM | POA: Diagnosis not present

## 2015-10-10 DIAGNOSIS — M25612 Stiffness of left shoulder, not elsewhere classified: Secondary | ICD-10-CM

## 2015-10-10 DIAGNOSIS — R29898 Other symptoms and signs involving the musculoskeletal system: Secondary | ICD-10-CM

## 2015-10-10 NOTE — Therapy (Signed)
[facility] [address]  [place], [place], [address] Phone: [phone]   Fax:  [phone]  Physical Therapy Treatment  Patient Details  Name: Zachary [name] MRN: [id number] Date of Birth: 03/14/1951 Referring Provider: Kathryne Hitch  Encounter Date: 10/10/2015      PT End of Session - 10/10/15 1342    Visit Number 23   Number of Visits 28   Date for PT Re-Evaluation 10/19/15   PT Start Time 1300   PT Stop Time 1357   PT Time Calculation (min) 57 min   Activity Tolerance Patient tolerated treatment well   Behavior During Therapy [facility] for tasks assessed/performed      Past Medical History  Diagnosis Date  . Hyperlipidemia   . Hypertension   . Insomnia   . Hypothyroidism   . GERD (gastroesophageal reflux disease)   . Arthritis   . Hemorrhoids   . Hearing problem     hearing deficit  . Diverticulitis   . Wears glasses   . Wears hearing aid     both ears  . Cancer 2015    positive prostate cancer bx  . Sleep apnea     uses a cpap    Past Surgical History  Procedure Laterality Date  . Hemorrhoid surgery  08/2006  . Back surgery  1978    herniated disksurgery  . Tendon repair  June 06, 2011    right elbow, Dr. Percell Miller  . Cardiac catheterization  08/01/2010  . Colonoscopy    . Shoulder arthroscopy with subacromial decompression, rotator cuff repair and bicep tendon repair Right 03/10/2014    Procedure: RIGHT SHOULDER ARTHROSCOPY WITH SUBACROMIAL DECOMPRESSION, PARTIAL ACROMIOPLASTY WITH CORACOAROMIAL LABRUM DEBRIDEMENT RELEASE DISTAL CLAVICULECTOMY,  ROTATOR CUFF REPAIR AND EXTENSIVE DEBRIDEMENT;  Surgeon: Ninetta Lights, MD;  Location: [facility];  Service: Orthopedics;  Laterality: Right;  . Exam under anesthesia with manipulation of shoulder Right 09/15/2014    Procedure: RIGHT SHOULDER MANIPULATION UNDER ANESTHESIA;  Surgeon: Ninetta Lights, MD;  Location: [place];  Service: Orthopedics;  Laterality: Right;  . Shoulder arthroscopy with rotator cuff repair and subacromial decompression Left 07/07/2015    Procedure: LEFT SHOULDER SCOPE DEBRIDEMENT, SUBACROMIAL DECOMPRESSION, DISTAL CLAVICULECTOMY, ROTATOR CUFF REPAIR  ;  Surgeon: Kathryne Hitch, MD;  Location: [place];  Service: Orthopedics;  Laterality: Left;  ANESTHESIA: GENERAL, PRE/POST OP SCALENE  . Shoulder arthroscopy with distal clavicle resection Left 07/07/2015    Procedure: SHOULDER ARTHROSCOPY WITH DISTAL CLAVICLE RESECTION;  Surgeon: Kathryne Hitch, MD;  Location: [place];  Service: Orthopedics;  Laterality: Left;    There were no vitals filed for this visit.  Visit Diagnosis:  Left shoulder pain  Shoulder weakness  Shoulder stiffness, left      Subjective Assessment - 10/10/15 1301    Subjective went to MD on Friday, given option of 3-4 more weeks of PT or manipulation.  Pt opted for additional therapy and will plan for manipulation if needed.   Patient Stated Goals regain full use of L UE   Currently in Pain? Yes   Pain Score 5    Pain Location Shoulder   Pain Orientation Left;Upper;Lateral   Pain Descriptors / Indicators Sharp;Shooting   Pain Onset 1 to 4 weeks ago   Pain Frequency Intermittent   Aggravating Factors  sleeping/rolling over   Pain Relieving Factors medication  [facility] PT Assessment - 10/10/15 1428    Assessment   Referring Provider Kathryne Hitch                     [facility] Adult PT Treatment/Exercise - 10/10/15 1302    Exercises   Exercises Shoulder   Shoulder Exercises: Supine   Protraction Strengthening;Left;20 reps;Weights   Protraction Weight (lbs) 7   Flexion Left;20 reps;Weights   Shoulder Flexion Weight (lbs) 3   Shoulder Exercises: Sidelying   ABduction Strengthening;Left;20 reps   Shoulder Exercises: ROM/Strengthening   UBE (Upper Arm Bike) L 3 2 min forward/2 min backward   Wall  Pushups 20 reps   Wall Pushups Limitations on counter   Rhythmic Stabilization, Supine L with 3# 3x30 sed supine and sidelying   Shoulder Exercises: Stretch   Wall Stretch - Flexion 20 seconds;5 reps   Wall Stretch - Flexion Limitations with ball x 2 sets   Modalities   Modalities Vasopneumatic   Vasopneumatic   Number Minutes Vasopneumatic  15 minutes   Vasopnuematic Location  Shoulder   Vasopneumatic Pressure Medium   Vasopneumatic Temperature  max cold   Manual Therapy   Manual Therapy Joint mobilization   Joint Mobilization inf and A/P glides grade 2-3 L shoulder                  PT Short Term Goals - 09/19/15 1626    PT SHORT TERM GOAL #1   Title L shoulder PROM Flexion to 160, ABD to 150, and ER to 75 at 90 ABD by 08/18/15   Status Achieved           PT Long Term Goals - 09/21/15 1755    PT LONG TERM GOAL #1   Title L Shoulder AROM WFL all planes without limit by pain by 10/19/15   Status On-going   PT LONG TERM GOAL #2   Title L Shoulder MMT 4/5 or better all planes by 10/19/15   Status On-going   PT LONG TERM GOAL #3   Title pt able to return to all ADLs, chores, and work duties without limitation by shoulder pain, LOM, or weakness by 10/19/15   Status On-going               Plan - 10/10/15 1342    Clinical Impression Statement Pt continues to report stiffness at end range of L shoulder.  Plan to focus on scapular strengthening and ROM to reduce risk of manupulation.   PT Treatment/Interventions Manual techniques;Therapeutic exercise;Therapeutic activities;Patient/family education;Vasopneumatic Device;Taping;Electrical Stimulation;Cryotherapy   PT Next Visit Plan scap stab; manual; ROM   Consulted and Agree with Plan of Care Patient        Problem List Patient Active Problem List   Diagnosis Date Noted  . Complete rotator cuff tear of left shoulder 07/05/2015  . Tennis elbow 05/30/2014  . Rotator cuff (capsule) sprain 03/10/2014  .  Right shoulder pain 02/11/2014  . Memory difficulty 12/18/2012  . Gynecomastia, male 07/30/2012  . OSA (obstructive sleep apnea) 04/23/2011  . EXTERNAL HEMORRHOIDS 02/12/2011  . Prostate cancer ([place]) 11/12/2010  . PLANTAR FASCIITIS 04/09/2010  . Hypothyroidism 12/08/2009  . NEOPLASM OF UNCERTAIN BEHAVIOR OF SKIN 03/10/2009  . OSTEOARTHRITIS 02/14/2009  . HYPERLIPIDEMIA 02/08/2009  . HEARING DEFICIT 02/08/2009  . Essential hypertension 02/08/2009  . ALLERGIC RHINITIS 02/08/2009  . GERD 02/08/2009  . PROTEINURIA 02/08/2009  . DIVERTICULITIS, HX OF 02/08/2009   Laureen Abrahams, PT, DPT 10/10/2015 2:29 PM  [facility] [address]  [place], [place], [address] Phone: [phone]   Fax:  [phone]  Name: AAIDYN SAN MRN: [id number] Date of Birth: [birth year]

## 2015-10-12 ENCOUNTER — Ambulatory Visit: Payer: 59 | Admitting: Physical Therapy

## 2015-10-12 DIAGNOSIS — M25512 Pain in left shoulder: Secondary | ICD-10-CM

## 2015-10-12 DIAGNOSIS — M25612 Stiffness of left shoulder, not elsewhere classified: Secondary | ICD-10-CM

## 2015-10-12 DIAGNOSIS — R29898 Other symptoms and signs involving the musculoskeletal system: Secondary | ICD-10-CM

## 2015-10-12 NOTE — Therapy (Signed)
[facility] [address]  [place], [place], [address] Phone: [phone]   Fax:  [phone]  Physical Therapy Treatment  Patient Details  Name: Zachary [name] MRN: [id number] Date of Birth: 22-Jan-1951 Referring Provider: Kathryne Hitch  Encounter Date: 10/12/2015      PT End of Session - 10/12/15 0832    Visit Number 24   Number of Visits 28   Date for PT Re-Evaluation 10/19/15   PT Start Time 0758   PT Stop Time 0848   PT Time Calculation (min) 50 min   Activity Tolerance Patient tolerated treatment well   Behavior During Therapy [facility] for tasks assessed/performed      Past Medical History  Diagnosis Date  . Hyperlipidemia   . Hypertension   . Insomnia   . Hypothyroidism   . GERD (gastroesophageal reflux disease)   . Arthritis   . Hemorrhoids   . Hearing problem     hearing deficit  . Diverticulitis   . Wears glasses   . Wears hearing aid     both ears  . Cancer 2015    positive prostate cancer bx  . Sleep apnea     uses a cpap    Past Surgical History  Procedure Laterality Date  . Hemorrhoid surgery  08/2006  . Back surgery  1978    herniated disksurgery  . Tendon repair  June 06, 2011    right elbow, Dr. Percell Miller  . Cardiac catheterization  08/01/2010  . Colonoscopy    . Shoulder arthroscopy with subacromial decompression, rotator cuff repair and bicep tendon repair Right 03/10/2014    Procedure: RIGHT SHOULDER ARTHROSCOPY WITH SUBACROMIAL DECOMPRESSION, PARTIAL ACROMIOPLASTY WITH CORACOAROMIAL LABRUM DEBRIDEMENT RELEASE DISTAL CLAVICULECTOMY,  ROTATOR CUFF REPAIR AND EXTENSIVE DEBRIDEMENT;  Surgeon: Ninetta Lights, MD;  Location: [place];  Service: Orthopedics;  Laterality: Right;  . Exam under anesthesia with manipulation of shoulder Right 09/15/2014    Procedure: RIGHT SHOULDER MANIPULATION UNDER ANESTHESIA;  Surgeon: Ninetta Lights, MD;  Location: [place];  Service: Orthopedics;  Laterality: Right;  . Shoulder arthroscopy with rotator cuff repair and subacromial decompression Left 07/07/2015    Procedure: LEFT SHOULDER SCOPE DEBRIDEMENT, SUBACROMIAL DECOMPRESSION, DISTAL CLAVICULECTOMY, ROTATOR CUFF REPAIR  ;  Surgeon: Kathryne Hitch, MD;  Location: [place];  Service: Orthopedics;  Laterality: Left;  ANESTHESIA: GENERAL, PRE/POST OP SCALENE  . Shoulder arthroscopy with distal clavicle resection Left 07/07/2015    Procedure: SHOULDER ARTHROSCOPY WITH DISTAL CLAVICLE RESECTION;  Surgeon: Kathryne Hitch, MD;  Location: [place];  Service: Orthopedics;  Laterality: Left;    There were no vitals filed for this visit.  Visit Diagnosis:  Left shoulder pain  Shoulder weakness  Shoulder stiffness, left      Subjective Assessment - 10/12/15 0801    Subjective Didn't sleep last night due to pain; still having some pain today   Pertinent History R RCR March 2015   Patient Stated Goals regain full use of L UE   Currently in Pain? Yes   Pain Score 5    Pain Location Shoulder   Pain Orientation Left;Upper;Lateral   Pain Descriptors / Indicators Sharp;Shooting   Pain Onset 1 to 4 weeks ago   Pain Frequency Intermittent   Aggravating Factors  sleeping; rolling over   Pain Relieving Factors medication  [facility] Adult PT Treatment/Exercise - 10/12/15 0803    Shoulder Exercises: Therapy Ball   Flexion 10 reps   Flexion Limitations 10 sec hold   Shoulder Exercises: ROM/Strengthening   UBE (Upper Arm Bike) L 3 2 min forward/2 min backward   Cybex Row Limitations 25# 2x10 both holds   Shoulder Exercises: Stretch   Internal Rotation Stretch 3 reps   Internal Rotation Stretch Limitations 30 sec; stretching into external rotation; door frame with hand down   Vasopneumatic   Number Minutes Vasopneumatic  15 minutes   Vasopnuematic Location  Shoulder   Vasopneumatic Pressure  Medium   Vasopneumatic Temperature  max cold   Manual Therapy   Manual Therapy Joint mobilization   Joint Mobilization inf and A/P glides grade 2-3 L shoulder                  PT Short Term Goals - 09/19/15 1626    PT SHORT TERM GOAL #1   Title L shoulder PROM Flexion to 160, ABD to 150, and ER to 75 at 90 ABD by 08/18/15   Status Achieved           PT Long Term Goals - 09/21/15 1755    PT LONG TERM GOAL #1   Title L Shoulder AROM WFL all planes without limit by pain by 10/19/15   Status On-going   PT LONG TERM GOAL #2   Title L Shoulder MMT 4/5 or better all planes by 10/19/15   Status On-going   PT LONG TERM GOAL #3   Title pt able to return to all ADLs, chores, and work duties without limitation by shoulder pain, LOM, or weakness by 10/19/15   Status On-going               Plan - 10/12/15 4174    Clinical Impression Statement Tightness noted along anterior shoulder; therefore added stretches and encouraged to perform at home.   PT Next Visit Plan scap stab; manual; ROM   Consulted and Agree with Plan of Care Patient        Problem List Patient Active Problem List   Diagnosis Date Noted  . Complete rotator cuff tear of left shoulder 07/05/2015  . Tennis elbow 05/30/2014  . Rotator cuff (capsule) sprain 03/10/2014  . Right shoulder pain 02/11/2014  . Memory difficulty 12/18/2012  . Gynecomastia, male 07/30/2012  . OSA (obstructive sleep apnea) 04/23/2011  . EXTERNAL HEMORRHOIDS 02/12/2011  . Prostate cancer ([place]) 11/12/2010  . PLANTAR FASCIITIS 04/09/2010  . Hypothyroidism 12/08/2009  . NEOPLASM OF UNCERTAIN BEHAVIOR OF SKIN 03/10/2009  . OSTEOARTHRITIS 02/14/2009  . HYPERLIPIDEMIA 02/08/2009  . HEARING DEFICIT 02/08/2009  . Essential hypertension 02/08/2009  . ALLERGIC RHINITIS 02/08/2009  . GERD 02/08/2009  . PROTEINURIA 02/08/2009  . DIVERTICULITIS, HX OF 02/08/2009   Laureen Abrahams, PT, DPT 10/12/2015 9:36 AM  [facility] [address]  [place], [place], [address] Phone: [phone]   Fax:  [phone]  Name: Zachary [name] MRN: [id number] Date of Birth: 02/05/1951

## 2015-10-16 ENCOUNTER — Ambulatory Visit: Payer: 59 | Admitting: Physical Therapy

## 2015-10-17 ENCOUNTER — Ambulatory Visit: Payer: 59 | Admitting: Physical Therapy

## 2015-10-17 DIAGNOSIS — R29898 Other symptoms and signs involving the musculoskeletal system: Secondary | ICD-10-CM

## 2015-10-17 DIAGNOSIS — M25612 Stiffness of left shoulder, not elsewhere classified: Secondary | ICD-10-CM

## 2015-10-17 DIAGNOSIS — M25512 Pain in left shoulder: Secondary | ICD-10-CM | POA: Diagnosis not present

## 2015-10-17 NOTE — Therapy (Signed)
[facility] [name] [address]  [place], [place], [address] Phone: [phone]   Fax:  [phone]  Physical Therapy Treatment  Patient Details  Name: Zachary [name] MRN: [id number] Date of Birth: June 14, 1951 Referring Provider: Kathryne Hitch  Encounter Date: 10/17/2015      PT End of Session - 10/17/15 1619    Visit Number 25   Number of Visits 28   Date for PT Re-Evaluation 10/19/15   PT Start Time 5465   PT Stop Time 1617   PT Time Calculation (min) 44 min      Past Medical History  Diagnosis Date  . Hyperlipidemia   . Hypertension   . Insomnia   . Hypothyroidism   . GERD (gastroesophageal reflux disease)   . Arthritis   . Hemorrhoids   . Hearing problem     hearing deficit  . Diverticulitis   . Wears glasses   . Wears hearing aid     both ears  . Cancer 2015    positive prostate cancer bx  . Sleep apnea     uses a cpap    Past Surgical History  Procedure Laterality Date  . Hemorrhoid surgery  08/2006  . Back surgery  1978    herniated disksurgery  . Tendon repair  June 06, 2011    right elbow, Dr. Percell Miller  . Cardiac catheterization  08/01/2010  . Colonoscopy    . Shoulder arthroscopy with subacromial decompression, rotator cuff repair and bicep tendon repair Right 03/10/2014    Procedure: RIGHT SHOULDER ARTHROSCOPY WITH SUBACROMIAL DECOMPRESSION, PARTIAL ACROMIOPLASTY WITH CORACOAROMIAL LABRUM DEBRIDEMENT RELEASE DISTAL CLAVICULECTOMY,  ROTATOR CUFF REPAIR AND EXTENSIVE DEBRIDEMENT;  Surgeon: Ninetta Lights, MD;  Location: [place];  Service: Orthopedics;  Laterality: Right;  . Exam under anesthesia with manipulation of shoulder Right 09/15/2014    Procedure: RIGHT SHOULDER MANIPULATION UNDER ANESTHESIA;  Surgeon: Ninetta Lights, MD;  Location: [place];  Service: Orthopedics;  Laterality: Right;  . Shoulder arthroscopy with rotator cuff repair and subacromial  decompression Left 07/07/2015    Procedure: LEFT SHOULDER SCOPE DEBRIDEMENT, SUBACROMIAL DECOMPRESSION, DISTAL CLAVICULECTOMY, ROTATOR CUFF REPAIR  ;  Surgeon: Kathryne Hitch, MD;  Location: [place];  Service: Orthopedics;  Laterality: Left;  ANESTHESIA: GENERAL, PRE/POST OP SCALENE  . Shoulder arthroscopy with distal clavicle resection Left 07/07/2015    Procedure: SHOULDER ARTHROSCOPY WITH DISTAL CLAVICLE RESECTION;  Surgeon: Kathryne Hitch, MD;  Location: [place];  Service: Orthopedics;  Laterality: Left;    There were no vitals filed for this visit.  Visit Diagnosis:  Left shoulder pain  Shoulder weakness  Shoulder stiffness, left      Subjective Assessment - 10/17/15 1542    Subjective pt states continues to have difficulty sleeping due to pain.  He states pain runs 2-3/10 throughout the day but up to 5-6/10 at night.   Currently in Pain? Yes   Pain Score 3   2-3/10   Pain Location Shoulder   Pain Orientation Left;Upper;Lateral            [facility] PT Assessment - 10/17/15 0001    AROM   Left Shoulder Flexion 152 Degrees   Left Shoulder ABduction 145 Degrees   Left Shoulder Internal Rotation --  Reach to T11, R to T9   Left Shoulder External Rotation --  reach to T3, T4 on R   PROM   Left Shoulder Flexion 167 Degrees  Left Shoulder Internal Rotation 75 Degrees   Left Shoulder External Rotation 90 Degrees          TODAY'S TREATMENT TherEx - UBE lvl 2.0 2'/2' Pulleys Flex and ABD 20x each; IR stretch 10x5" Hooklying on 1/2 Foam Roll: pec stretch 1'    Pullover 6# 15x    B Horiz ABD 2# 10x    B D2 Flexion 2# 10x    B Shoulder Extension Green TB 12x (PT holding center of band)    L D2 Extension Green TB 10x    L D1 Extension Green TB 10x R Side-Lying L Shoulder ABD 20-45 dg 2# 30x then full ABD 2# 15x Standing Back to wall along Foam Roller: B shoulder Flexion 1# 10x (near full ROM with only slight excessive scap elevation on L  vs R)    B Shoulder ABD 1# 10x    W Green TB 15x Low Row 25# 15x, 35# 15x              PT Short Term Goals - 09/19/15 1626    PT SHORT TERM GOAL #1   Title L shoulder PROM Flexion to 160, ABD to 150, and ER to 75 at 90 ABD by 08/18/15   Status Achieved           PT Long Term Goals - 10/17/15 1806    PT LONG TERM GOAL #1   Title L Shoulder AROM WFL all planes without limit by pain by 10/19/15   Status Achieved   PT LONG TERM GOAL #2   Title L Shoulder MMT 4/5 or better all planes by 10/19/15  met other than ABD   Status Partially Met   PT LONG TERM GOAL #3   Title pt able to return to all ADLs, chores, and work duties without limitation by shoulder pain, LOM, or weakness by 10/19/15   Status On-going               Plan - 10/17/15 1617    Clinical Impression Statement excellent progress in AROM.  Is able to perform standing shoulder Flexion with nearly normal scapular mechanics which is significantly improved.  pt still having difficulty sleeping at night due to pain but otherwise he seems to be progressing very well lately.   PT Next Visit Plan manual and modalitie PRN for pain and ROM; functional training and AROM   Consulted and Agree with Plan of Care Patient        Problem List Patient Active Problem List   Diagnosis Date Noted  . Complete rotator cuff tear of left shoulder 07/05/2015  . Tennis elbow 05/30/2014  . Rotator cuff (capsule) sprain 03/10/2014  . Right shoulder pain 02/11/2014  . Memory difficulty 12/18/2012  . Gynecomastia, male 07/30/2012  . OSA (obstructive sleep apnea) 04/23/2011  . EXTERNAL HEMORRHOIDS 02/12/2011  . Prostate cancer ([place]) 11/12/2010  . PLANTAR FASCIITIS 04/09/2010  . Hypothyroidism 12/08/2009  . NEOPLASM OF UNCERTAIN BEHAVIOR OF SKIN 03/10/2009  . OSTEOARTHRITIS 02/14/2009  . HYPERLIPIDEMIA 02/08/2009  . HEARING DEFICIT 02/08/2009  . Essential hypertension 02/08/2009  . ALLERGIC RHINITIS 02/08/2009  . GERD  02/08/2009  . PROTEINURIA 02/08/2009  . DIVERTICULITIS, HX OF 02/08/2009    Aris Moman PT, OCS 10/17/2015, 6:10 PM  [facility] [address]  [place], [place], [address] Phone: [phone]   Fax:  [phone]  Name: Zachary [name] MRN: [id number] Date of Birth: 30-Nov-1951

## 2015-10-19 ENCOUNTER — Ambulatory Visit: Payer: 59 | Admitting: Physical Therapy

## 2015-10-19 DIAGNOSIS — M25512 Pain in left shoulder: Secondary | ICD-10-CM | POA: Diagnosis not present

## 2015-10-19 DIAGNOSIS — R29898 Other symptoms and signs involving the musculoskeletal system: Secondary | ICD-10-CM

## 2015-10-19 DIAGNOSIS — M25612 Stiffness of left shoulder, not elsewhere classified: Secondary | ICD-10-CM

## 2015-10-19 NOTE — Therapy (Signed)
[facility] [address]  [place], [place], [address] Phone: [phone]   Fax:  [phone]  Physical Therapy Treatment  Patient Details  Name: Zachary [name] MRN: [id number] Date of Birth: 01/24/1951 Referring Provider: Kathryne Hitch  Encounter Date: 10/19/2015      PT End of Session - 10/19/15 0834    Visit Number 26   Number of Visits 32   Date for PT Re-Evaluation 11/09/15   PT Start Time 0800   PT Stop Time 0855   PT Time Calculation (min) 55 min   Activity Tolerance Patient tolerated treatment well   Behavior During Therapy [facility] for tasks assessed/performed      Past Medical History  Diagnosis Date  . Hyperlipidemia   . Hypertension   . Insomnia   . Hypothyroidism   . GERD (gastroesophageal reflux disease)   . Arthritis   . Hemorrhoids   . Hearing problem     hearing deficit  . Diverticulitis   . Wears glasses   . Wears hearing aid     both ears  . Cancer 2015    positive prostate cancer bx  . Sleep apnea     uses a cpap    Past Surgical History  Procedure Laterality Date  . Hemorrhoid surgery  08/2006  . Back surgery  1978    herniated disksurgery  . Tendon repair  June 06, 2011    right elbow, Dr. Percell Miller  . Cardiac catheterization  08/01/2010  . Colonoscopy    . Shoulder arthroscopy with subacromial decompression, rotator cuff repair and bicep tendon repair Right 03/10/2014    Procedure: RIGHT SHOULDER ARTHROSCOPY WITH SUBACROMIAL DECOMPRESSION, PARTIAL ACROMIOPLASTY WITH CORACOAROMIAL LABRUM DEBRIDEMENT RELEASE DISTAL CLAVICULECTOMY,  ROTATOR CUFF REPAIR AND EXTENSIVE DEBRIDEMENT;  Surgeon: Ninetta Lights, MD;  Location: [place];  Service: Orthopedics;  Laterality: Right;  . Exam under anesthesia with manipulation of shoulder Right 09/15/2014    Procedure: RIGHT SHOULDER MANIPULATION UNDER ANESTHESIA;  Surgeon: Ninetta Lights, MD;  Location: [place];  Service: Orthopedics;  Laterality: Right;  . Shoulder arthroscopy with rotator cuff repair and subacromial decompression Left 07/07/2015    Procedure: LEFT SHOULDER SCOPE DEBRIDEMENT, SUBACROMIAL DECOMPRESSION, DISTAL CLAVICULECTOMY, ROTATOR CUFF REPAIR  ;  Surgeon: Kathryne Hitch, MD;  Location: [place];  Service: Orthopedics;  Laterality: Left;  ANESTHESIA: GENERAL, PRE/POST OP SCALENE  . Shoulder arthroscopy with distal clavicle resection Left 07/07/2015    Procedure: SHOULDER ARTHROSCOPY WITH DISTAL CLAVICLE RESECTION;  Surgeon: Kathryne Hitch, MD;  Location: [place];  Service: Orthopedics;  Laterality: Left;    There were no vitals filed for this visit.  Visit Diagnosis:  Left shoulder pain - Plan: PT plan of care cert/re-cert  Shoulder weakness - Plan: PT plan of care cert/re-cert  Shoulder stiffness, left - Plan: PT plan of care cert/re-cert      Subjective Assessment - 10/19/15 0801    Subjective shoulder feeling sore; still has a "kink" in it   Patient Stated Goals regain full use of L UE   Currently in Pain? Yes   Pain Score 4    Pain Location Shoulder   Pain Orientation Left;Upper;Lateral   Pain Descriptors / Indicators Sharp;Shooting   Pain Type Chronic pain   Pain Onset More than a month ago   Pain Frequency Intermittent   Aggravating Factors  sleeping, rollingover   Pain Relieving Factors meds  [facility] PT Assessment - 10/19/15 0835    AROM   Left Shoulder Flexion 152 Degrees   Left Shoulder ABduction 145 Degrees   Left Shoulder Internal Rotation --  Reach to T11, R to T9   Left Shoulder External Rotation --  reach to T3, T4 on R   PROM   Left Shoulder Flexion 167 Degrees   Left Shoulder Internal Rotation 75 Degrees   Left Shoulder External Rotation 90 Degrees                     [facility] Adult PT Treatment/Exercise - 10/19/15 0802    Shoulder Exercises: Supine   Horizontal ABduction  Strengthening;Left;20 reps;Weights   Horizontal ABduction Weight (lbs) 2   Horizontal ABduction Limitations on 1/2 foam roll   Flexion Left;20 reps;Weights   Shoulder Flexion Weight (lbs) 6   Flexion Limitations bil hand hold; on 1/2 foam roll   Other Supine Exercises D1/D2 x 20 2#, on 1/2 foam roll   Shoulder Exercises: Pulleys   Flexion 3 minutes   ABduction 3 minutes   Other Pulley Exercises IR x 3 min   Shoulder Exercises: ROM/Strengthening   UBE (Upper Arm Bike) L 3.5 2 min forward/2 min backward   Cybex Row Limitations 25# 2x10 both holds   Shoulder Exercises: Stretch   Other Shoulder Stretches chest stretch on 1/2 foam roll x 3 min   Vasopneumatic   Number Minutes Vasopneumatic  15 minutes   Vasopnuematic Location  Shoulder   Vasopneumatic Pressure Medium   Vasopneumatic Temperature  max cold                  PT Short Term Goals - 09/19/15 1626    PT SHORT TERM GOAL #1   Title L shoulder PROM Flexion to 160, ABD to 150, and ER to 75 at 90 ABD by 08/18/15   Status Achieved           PT Long Term Goals - 10/17/15 1806    PT LONG TERM GOAL #1   Title L Shoulder AROM WFL all planes without limit by pain by 10/19/15   Status Achieved   PT LONG TERM GOAL #2   Title L Shoulder MMT 4/5 or better all planes by 10/19/15  met other than ABD   Status Partially Met   PT LONG TERM GOAL #3   Title pt able to return to all ADLs, chores, and work duties without limitation by shoulder pain, LOM, or weakness by 10/19/15   Status On-going               Plan - 10/19/15 0836    Clinical Impression Statement Pt demonstrating excellent ROM.  Still demonstrates some weakness and tightness at end range.  Will plan to see 2 more weeks to maximize function.   Pt will benefit from skilled therapeutic intervention in order to improve on the following deficits Pain;Postural dysfunction;Decreased strength;Decreased mobility;Improper body mechanics;Decreased range of motion    Rehab Potential Good   PT Frequency 2x / week   PT Duration 2 weeks   PT Treatment/Interventions Manual techniques;Therapeutic exercise;Therapeutic activities;Patient/family education;Vasopneumatic Device;Taping;Electrical Stimulation;Cryotherapy   PT Next Visit Plan manual and modalitie PRN for pain and ROM; functional training and AROM   Consulted and Agree with Plan of Care Patient        Problem List Patient Active Problem List   Diagnosis Date Noted  . Complete rotator cuff tear of left shoulder 07/05/2015  . Tennis elbow  05/30/2014  . Rotator cuff (capsule) sprain 03/10/2014  . Right shoulder pain 02/11/2014  . Memory difficulty 12/18/2012  . Gynecomastia, male 07/30/2012  . OSA (obstructive sleep apnea) 04/23/2011  . EXTERNAL HEMORRHOIDS 02/12/2011  . Prostate cancer ([place]) 11/12/2010  . PLANTAR FASCIITIS 04/09/2010  . Hypothyroidism 12/08/2009  . NEOPLASM OF UNCERTAIN BEHAVIOR OF SKIN 03/10/2009  . OSTEOARTHRITIS 02/14/2009  . HYPERLIPIDEMIA 02/08/2009  . HEARING DEFICIT 02/08/2009  . Essential hypertension 02/08/2009  . ALLERGIC RHINITIS 02/08/2009  . GERD 02/08/2009  . PROTEINURIA 02/08/2009  . DIVERTICULITIS, HX OF 02/08/2009   Laureen Abrahams, PT, DPT 10/19/2015 9:40 AM  [facility] [address]  [place], [place], [address] Phone: [phone]   Fax:  [phone]  Name: DABID GODOWN MRN: [id number] Date of Birth: 28-Jul-1951

## 2015-10-23 ENCOUNTER — Ambulatory Visit: Payer: 59 | Admitting: Physical Therapy

## 2015-10-23 DIAGNOSIS — M25612 Stiffness of left shoulder, not elsewhere classified: Secondary | ICD-10-CM

## 2015-10-23 DIAGNOSIS — R29898 Other symptoms and signs involving the musculoskeletal system: Secondary | ICD-10-CM

## 2015-10-23 DIAGNOSIS — M25512 Pain in left shoulder: Secondary | ICD-10-CM

## 2015-10-23 NOTE — Therapy (Signed)
[facility] [address]  [place], [place], [address] Phone: [phone]   Fax:  [phone]  Physical Therapy Treatment  Patient Details  Name: Zachary [name] MRN: [id number] Date of Birth: [birth year] Referring Provider: Kathryne Hitch  Encounter Date: 10/23/2015      PT End of Session - 10/23/15 0850    Visit Number 27   Number of Visits 32   Date for PT Re-Evaluation 11/09/15   PT Start Time 0845   PT Stop Time 0933   PT Time Calculation (min) 48 min   Activity Tolerance Patient tolerated treatment well   Behavior During Therapy [facility] for tasks assessed/performed      Past Medical History  Diagnosis Date  . Hyperlipidemia   . Hypertension   . Insomnia   . Hypothyroidism   . GERD (gastroesophageal reflux disease)   . Arthritis   . Hemorrhoids   . Hearing problem     hearing deficit  . Diverticulitis   . Wears glasses   . Wears hearing aid     both ears  . Cancer 2015    positive prostate cancer bx  . Sleep apnea     uses a cpap    Past Surgical History  Procedure Laterality Date  . Hemorrhoid surgery  08/2006  . Back surgery  1978    herniated disksurgery  . Tendon repair  June 06, 2011    right elbow, Dr. Percell Miller  . Cardiac catheterization  08/01/2010  . Colonoscopy    . Shoulder arthroscopy with subacromial decompression, rotator cuff repair and bicep tendon repair Right 03/10/2014    Procedure: RIGHT SHOULDER ARTHROSCOPY WITH SUBACROMIAL DECOMPRESSION, PARTIAL ACROMIOPLASTY WITH CORACOAROMIAL LABRUM DEBRIDEMENT RELEASE DISTAL CLAVICULECTOMY,  ROTATOR CUFF REPAIR AND EXTENSIVE DEBRIDEMENT;  Surgeon: Ninetta Lights, MD;  Location: [place];  Service: Orthopedics;  Laterality: Right;  . Exam under anesthesia with manipulation of shoulder Right 09/15/2014    Procedure: RIGHT SHOULDER MANIPULATION UNDER ANESTHESIA;  Surgeon: Ninetta Lights, MD;  Location: [place];  Service: Orthopedics;  Laterality: Right;  . Shoulder arthroscopy with rotator cuff repair and subacromial decompression Left 07/07/2015    Procedure: LEFT SHOULDER SCOPE DEBRIDEMENT, SUBACROMIAL DECOMPRESSION, DISTAL CLAVICULECTOMY, ROTATOR CUFF REPAIR  ;  Surgeon: Kathryne Hitch, MD;  Location: [place];  Service: Orthopedics;  Laterality: Left;  ANESTHESIA: GENERAL, PRE/POST OP SCALENE  . Shoulder arthroscopy with distal clavicle resection Left 07/07/2015    Procedure: SHOULDER ARTHROSCOPY WITH DISTAL CLAVICLE RESECTION;  Surgeon: Kathryne Hitch, MD;  Location: [place];  Service: Orthopedics;  Laterality: Left;    There were no vitals filed for this visit.  Visit Diagnosis:  Left shoulder pain  Shoulder weakness  Shoulder stiffness, left      Subjective Assessment - 10/23/15 0849    Subjective Patient still complains of a "kink" in his shoulder.   Currently in Pain? Yes   Pain Score 4    Pain Location Shoulder   Pain Orientation Left;Upper;Lateral            [facility] PT Assessment - 10/23/15 0845    ROM / Strength   AROM / PROM / Strength Strength   Strength   Strength Assessment Site Shoulder   Right/Left Shoulder Left   Left Shoulder Flexion 4+/5   Left Shoulder ABduction 4/5   Left Shoulder Internal Rotation 4+/5   Left Shoulder External Rotation 4/5  TODAY'S TREATMENT  TherEx -  UBE lvl 3.0 2'/2' Pulleys Flex and ABD 20x each; IR stretch 10x5" Hooklying on 1/2 Foam Roll: pec stretch 1'  Pullover 6# 15x  B Horiz ABD 2# 15x  B D2 Flexion 2# 15x  B Shoulder Extension Green TB 15x (PT holding center of band)  L D2 Extension Green TB 15x  L D1 Extension Green TB 15x R Side-Lying L Shoulder ABD 2# 15x    R Side-Lying L ER 2# 15x Standing Back to wall along Foam Roller: B shoulder Flexion 1# 12x (L near full ROM with only slight excessive scap elevation on L vs R)  B Shoulder ABD 1# 12x Standing "W"  Green TB 15x Low Row 25# 15x, 35# 15x  Vasopneumatic Compression - low pressure, L shoulder, 38dg, 10'            PT Short Term Goals - 09/19/15 1626    PT SHORT TERM GOAL #1   Title L shoulder PROM Flexion to 160, ABD to 150, and ER to 75 at 90 ABD by 08/18/15   Status Achieved           PT Long Term Goals - 10/23/15 0930    PT LONG TERM GOAL #1   Title L Shoulder AROM WFL all planes without limit by pain by 10/19/15   Status Achieved   PT LONG TERM GOAL #2   Title L Shoulder MMT 4/5 or better all planes by 10/19/15   Status Achieved   PT LONG TERM GOAL #3   Title pt able to return to all ADLs, chores, and work duties without limitation by shoulder pain, LOM, or weakness by 10/19/15   Status On-going               Plan - 10/23/15 4132    Clinical Impression Statement Patient demonstrating near full ROM but continues to report "kink" during movement and tightness at end range. Strength >/= 4/5 in left shoulder but remains limited in upper ROM with slight substitution noted. Patient continues to reported limited use of shoulder in overhead activities and disruption of sleep at night due to pain requiring use of Advil PM.   PT Next Visit Plan manual and modalities PRN for pain and ROM; functional training and AROM   Consulted and Agree with Plan of Care Patient        Problem List Patient Active Problem List   Diagnosis Date Noted  . Complete rotator cuff tear of left shoulder 07/05/2015  . Tennis elbow 05/30/2014  . Rotator cuff (capsule) sprain 03/10/2014  . Right shoulder pain 02/11/2014  . Memory difficulty 12/18/2012  . Gynecomastia, male 07/30/2012  . OSA (obstructive sleep apnea) 04/23/2011  . EXTERNAL HEMORRHOIDS 02/12/2011  . Prostate cancer ([place]) 11/12/2010  . PLANTAR FASCIITIS 04/09/2010  . Hypothyroidism 12/08/2009  . NEOPLASM OF UNCERTAIN BEHAVIOR OF SKIN 03/10/2009  . OSTEOARTHRITIS 02/14/2009  . HYPERLIPIDEMIA 02/08/2009  . HEARING  DEFICIT 02/08/2009  . Essential hypertension 02/08/2009  . ALLERGIC RHINITIS 02/08/2009  . GERD 02/08/2009  . PROTEINURIA 02/08/2009  . DIVERTICULITIS, HX OF 02/08/2009    Percival Spanish, PT, MPT 10/23/2015, 9:49 AM  [facility] [address]  [place], [place], [address] Phone: [phone]   Fax:  [phone]  Name: Zachary [name] MRN: [id number] Date of Birth: [birth year]

## 2015-10-24 ENCOUNTER — Ambulatory Visit: Payer: 59 | Attending: Orthopedic Surgery | Admitting: Physical Therapy

## 2015-10-24 DIAGNOSIS — R29898 Other symptoms and signs involving the musculoskeletal system: Secondary | ICD-10-CM | POA: Insufficient documentation

## 2015-10-24 DIAGNOSIS — M25512 Pain in left shoulder: Secondary | ICD-10-CM | POA: Insufficient documentation

## 2015-10-24 DIAGNOSIS — M25612 Stiffness of left shoulder, not elsewhere classified: Secondary | ICD-10-CM | POA: Diagnosis present

## 2015-10-24 NOTE — Therapy (Signed)
[facility] [address]  [place], [place], [address] Phone: [phone]   Fax:  [phone]  Physical Therapy Treatment  Patient Details  Name: Zachary [name] MRN: [id number] Date of Birth: 1951-07-17 Referring Provider: Kathryne Hitch  Encounter Date: 10/24/2015      PT End of Session - 10/24/15 0844    Visit Number 28   Number of Visits 32   Date for PT Re-Evaluation 11/09/15   PT Start Time 0843   PT Stop Time 0947   PT Time Calculation (min) 64 min      Past Medical History  Diagnosis Date  . Hyperlipidemia   . Hypertension   . Insomnia   . Hypothyroidism   . GERD (gastroesophageal reflux disease)   . Arthritis   . Hemorrhoids   . Hearing problem     hearing deficit  . Diverticulitis   . Wears glasses   . Wears hearing aid     both ears  . Cancer 2015    positive prostate cancer bx  . Sleep apnea     uses a cpap    Past Surgical History  Procedure Laterality Date  . Hemorrhoid surgery  08/2006  . Back surgery  1978    herniated disksurgery  . Tendon repair  June 06, 2011    right elbow, Dr. Percell Miller  . Cardiac catheterization  08/01/2010  . Colonoscopy    . Shoulder arthroscopy with subacromial decompression, rotator cuff repair and bicep tendon repair Right 03/10/2014    Procedure: RIGHT SHOULDER ARTHROSCOPY WITH SUBACROMIAL DECOMPRESSION, PARTIAL ACROMIOPLASTY WITH CORACOAROMIAL LABRUM DEBRIDEMENT RELEASE DISTAL CLAVICULECTOMY,  ROTATOR CUFF REPAIR AND EXTENSIVE DEBRIDEMENT;  Surgeon: Ninetta Lights, MD;  Location: [place];  Service: Orthopedics;  Laterality: Right;  . Exam under anesthesia with manipulation of shoulder Right 09/15/2014    Procedure: RIGHT SHOULDER MANIPULATION UNDER ANESTHESIA;  Surgeon: Ninetta Lights, MD;  Location: [place];  Service: Orthopedics;  Laterality: Right;  . Shoulder arthroscopy with rotator cuff repair and subacromial  decompression Left 07/07/2015    Procedure: LEFT SHOULDER SCOPE DEBRIDEMENT, SUBACROMIAL DECOMPRESSION, DISTAL CLAVICULECTOMY, ROTATOR CUFF REPAIR  ;  Surgeon: Kathryne Hitch, MD;  Location: [place];  Service: Orthopedics;  Laterality: Left;  ANESTHESIA: GENERAL, PRE/POST OP SCALENE  . Shoulder arthroscopy with distal clavicle resection Left 07/07/2015    Procedure: SHOULDER ARTHROSCOPY WITH DISTAL CLAVICLE RESECTION;  Surgeon: Kathryne Hitch, MD;  Location: [place];  Service: Orthopedics;  Laterality: Left;    There were no vitals filed for this visit.  Visit Diagnosis:  Left shoulder pain  Shoulder weakness  Shoulder stiffness, left      Subjective Assessment - 10/24/15 0844    Subjective continued pain, states has been awake since 1:30 this AM due to shoulder pain.  States did not take pain meds last night.  States pain continues to be in 4-6/10 range.   Currently in Pain? Yes   Pain Score 5             [facility] PT Assessment - 10/23/15 0845    ROM / Strength   AROM / PROM / Strength Strength   Strength   Strength Assessment Site Shoulder   Right/Left Shoulder Left   Left Shoulder Flexion 4+/5   Left Shoulder ABduction 4/5   Left Shoulder Internal Rotation 4+/5   Left Shoulder External Rotation 4/5  TODAY'S TREATMENT  TherEx - UBE lvl 2.5 2'/2' Pulleys Flex and ABD 20x each; IR stretch 10x5"  Manual - TPR to R anterior deltoid (this is pt's "kink"); grade 3 AP and Caudal glides with PROM all planes.  TherEx - Supine R GH CW/CCW in 90 flexion Hooklying on Foam Roll: pec stretch 1'  Pullover 6# 15x  B Horiz ADD 2# 15x Narrow Pul[place]own 25# 15x Standing L ER in 90 ABD Yellow TB 15x Corner Pec Stretch 2x20" Chest Press 10# 15x Low Row 35# 15x Seated OH Shoulder Press 2# 10x  Vasopneumatic Compression - low pressure, L shoulder, 38dg, 10'                      PT Long Term Goals - 10/23/15 0930     PT LONG TERM GOAL #1   Title L Shoulder AROM WFL all planes without limit by pain by 10/19/15   Status Achieved   PT LONG TERM GOAL #2   Title L Shoulder MMT 4/5 or better all planes by 10/19/15   Status Achieved   PT LONG TERM GOAL #3   Title pt able to return to all ADLs, chores, and work duties without limitation by shoulder pain, LOM, or weakness by 10/19/15   Status On-going               Plan - 10/24/15 0933    Clinical Impression Statement pt's c/o kink seems to be trigger point in anterior deltoid.  Addressed today with TPR but quetionable benefit.  Overall good progress with regard to shoulder function and AROM but still with c/o frequent pain and limited ability to sleep.   PT Next Visit Plan manual and modalities PRN for pain and ROM; functional training and AROM   Consulted and Agree with Plan of Care Patient        Problem List Patient Active Problem List   Diagnosis Date Noted  . Complete rotator cuff tear of left shoulder 07/05/2015  . Tennis elbow 05/30/2014  . Rotator cuff (capsule) sprain 03/10/2014  . Right shoulder pain 02/11/2014  . Memory difficulty 12/18/2012  . Gynecomastia, male 07/30/2012  . OSA (obstructive sleep apnea) 04/23/2011  . EXTERNAL HEMORRHOIDS 02/12/2011  . Prostate cancer ([place]) 11/12/2010  . PLANTAR FASCIITIS 04/09/2010  . Hypothyroidism 12/08/2009  . NEOPLASM OF UNCERTAIN BEHAVIOR OF SKIN 03/10/2009  . OSTEOARTHRITIS 02/14/2009  . HYPERLIPIDEMIA 02/08/2009  . HEARING DEFICIT 02/08/2009  . Essential hypertension 02/08/2009  . ALLERGIC RHINITIS 02/08/2009  . GERD 02/08/2009  . PROTEINURIA 02/08/2009  . DIVERTICULITIS, HX OF 02/08/2009    Tekeshia Klahr PT, OCS 10/24/2015, 9:35 AM  [facility] [address]  [place], [place], [address] Phone: [phone]   Fax:  [phone]  Name: Zachary [name] MRN: [id number] Date of Birth: 1951-01-17

## 2015-10-27 ENCOUNTER — Encounter: Payer: 59 | Admitting: Physical Therapy

## 2015-10-30 ENCOUNTER — Ambulatory Visit: Payer: 59 | Admitting: Physical Therapy

## 2015-10-30 DIAGNOSIS — M25612 Stiffness of left shoulder, not elsewhere classified: Secondary | ICD-10-CM

## 2015-10-30 DIAGNOSIS — R29898 Other symptoms and signs involving the musculoskeletal system: Secondary | ICD-10-CM

## 2015-10-30 DIAGNOSIS — M25512 Pain in left shoulder: Secondary | ICD-10-CM

## 2015-10-30 NOTE — Therapy (Addendum)
[facility] [address]  [place], [place], [address] Phone: [phone]   Fax:  [phone]  Physical Therapy Treatment  Patient Details  Name: Zachary [name] MRN: [id number] Date of Birth: 12-02-1951 Referring Provider: Kathryne Hitch  Encounter Date: 10/30/2015      PT End of Session - 10/30/15 0805    Visit Number 29   Number of Visits 32   Date for PT Re-Evaluation 11/09/15   PT Start Time 0802   PT Stop Time 6237   PT Time Calculation (min) 52 min      Past Medical History  Diagnosis Date  . Hyperlipidemia   . Hypertension   . Insomnia   . Hypothyroidism   . GERD (gastroesophageal reflux disease)   . Arthritis   . Hemorrhoids   . Hearing problem     hearing deficit  . Diverticulitis   . Wears glasses   . Wears hearing aid     both ears  . Cancer 2015    positive prostate cancer bx  . Sleep apnea     uses a cpap    Past Surgical History  Procedure Laterality Date  . Hemorrhoid surgery  08/2006  . Back surgery  1978    herniated disksurgery  . Tendon repair  June 06, 2011    right elbow, Dr. Percell Miller  . Cardiac catheterization  08/01/2010  . Colonoscopy    . Shoulder arthroscopy with subacromial decompression, rotator cuff repair and bicep tendon repair Right 03/10/2014    Procedure: RIGHT SHOULDER ARTHROSCOPY WITH SUBACROMIAL DECOMPRESSION, PARTIAL ACROMIOPLASTY WITH CORACOAROMIAL LABRUM DEBRIDEMENT RELEASE DISTAL CLAVICULECTOMY,  ROTATOR CUFF REPAIR AND EXTENSIVE DEBRIDEMENT;  Surgeon: Ninetta Lights, MD;  Location: [facility];  Service: Orthopedics;  Laterality: Right;  . Exam under anesthesia with manipulation of shoulder Right 09/15/2014    Procedure: RIGHT SHOULDER MANIPULATION UNDER ANESTHESIA;  Surgeon: Ninetta Lights, MD;  Location: [facility];  Service: Orthopedics;  Laterality: Right;  . Shoulder arthroscopy with rotator cuff repair and subacromial  decompression Left 07/07/2015    Procedure: LEFT SHOULDER SCOPE DEBRIDEMENT, SUBACROMIAL DECOMPRESSION, DISTAL CLAVICULECTOMY, ROTATOR CUFF REPAIR  ;  Surgeon: Kathryne Hitch, MD;  Location: [facility];  Service: Orthopedics;  Laterality: Left;  ANESTHESIA: GENERAL, PRE/POST OP SCALENE  . Shoulder arthroscopy with distal clavicle resection Left 07/07/2015    Procedure: SHOULDER ARTHROSCOPY WITH DISTAL CLAVICLE RESECTION;  Surgeon: Kathryne Hitch, MD;  Location: [facility];  Service: Orthopedics;  Laterality: Left;    There were no vitals filed for this visit.  Visit Diagnosis:  Left shoulder pain  Shoulder weakness  Shoulder stiffness, left      Subjective Assessment - 10/30/15 0805    Subjective continued intermittent difficulty with sleeping (wakes when rolls onto L)   Currently in Pain? Yes   Pain Score 4    Pain Location Shoulder   Pain Orientation Left           TODAY'S TREATMENT  TherEx - UBE lvl 3.0 2'/2' Low Row 25# 15x, 35# 15x Corner Pec Stretch 2x20" Elbows on PBall (65cm) Shoulder Flexion 15x Standing B Shoulder ABD 2# 2x10 Standing B Shoulder Flexion 2# 2x10 Narrow Pulldown 25# 15x, 35# 10x Shoulder Press Machine 10# 2x10 Standing Wand B Shoulder Ext 10x Standing Wand IR Stretch 10x5" Standing Pullover 15# 15x Standing L ER in 90 ABD Yellow TB 15x Prone over plinth corner B Bent T 1#  10x, T 1# 10x, Y 1# 10x Chest Press 10# 15x B Bicep Curl 8# 15x B Tricep Pushdown 15# 15x  Vasopneumatic Compression - low pressure, L shoulder, 38dg, 10'                        PT Short Term Goals - 09/19/15 1626    PT SHORT TERM GOAL #1   Title L shoulder PROM Flexion to 160, ABD to 150, and ER to 75 at 90 ABD by 08/18/15   Status Achieved           PT Long Term Goals - 10/23/15 0930    PT LONG TERM GOAL #1   Title L Shoulder AROM WFL all planes without limit by pain by 10/19/15   Status Achieved   PT LONG TERM  GOAL #2   Title L Shoulder MMT 4/5 or better all planes by 10/19/15   Status Achieved   PT LONG TERM GOAL #3   Title pt able to return to all ADLs, chores, and work duties without limitation by shoulder pain, LOM, or weakness by 10/19/15   Status On-going               Plan - 10/30/15 0844    Clinical Impression Statement today's treatment well tolerated.  Other than pt's c/o L shoudler pain his function is quite good. He notes pain with some stetches in clinic but otherwise no c/o increased pain while here.   PT Next Visit Plan manual and modalities PRN for pain and ROM; functional training   Consulted and Agree with Plan of Care Patient        Problem List Patient Active Problem List   Diagnosis Date Noted  . Complete rotator cuff tear of left shoulder 07/05/2015  . Tennis elbow 05/30/2014  . Rotator cuff (capsule) sprain 03/10/2014  . Right shoulder pain 02/11/2014  . Memory difficulty 12/18/2012  . Gynecomastia, male 07/30/2012  . OSA (obstructive sleep apnea) 04/23/2011  . EXTERNAL HEMORRHOIDS 02/12/2011  . Prostate cancer ([place]) 11/12/2010  . PLANTAR FASCIITIS 04/09/2010  . Hypothyroidism 12/08/2009  . NEOPLASM OF UNCERTAIN BEHAVIOR OF SKIN 03/10/2009  . OSTEOARTHRITIS 02/14/2009  . HYPERLIPIDEMIA 02/08/2009  . HEARING DEFICIT 02/08/2009  . Essential hypertension 02/08/2009  . ALLERGIC RHINITIS 02/08/2009  . GERD 02/08/2009  . PROTEINURIA 02/08/2009  . DIVERTICULITIS, HX OF 02/08/2009    Analeah Brame PT, OCS 10/30/2015, 8:46 AM  [facility] [address]  [place], [place], [address] Phone: [phone]   Fax:  [phone]  Name: Zachary [name] MRN: [id number] Date of Birth: 1951/09/11    PHYSICAL THERAPY DISCHARGE SUMMARY  Visits from Start of Care: 29  Current functional level related to goals / functional outcomes: Goals partially met, pt found to have re-injured L RC and is  scheduled for surgery.  Is being discharged from PT due to this.   Remaining deficits: Pain, weakness, LOM.   Education / Equipment: HEP  Plan: Patient agrees to discharge.  Patient goals were partially met. Patient is being discharged due to a change in medical status.  ?????        Leonette Most PT, OCS 12/21/2015 2:10 PM

## 2015-10-31 ENCOUNTER — Other Ambulatory Visit (HOSPITAL_COMMUNITY): Payer: Self-pay | Admitting: Orthopedic Surgery

## 2015-10-31 DIAGNOSIS — M25512 Pain in left shoulder: Secondary | ICD-10-CM

## 2015-11-02 ENCOUNTER — Ambulatory Visit: Payer: 59 | Admitting: Physical Therapy

## 2015-11-15 ENCOUNTER — Encounter (HOSPITAL_COMMUNITY): Payer: Self-pay

## 2015-11-15 ENCOUNTER — Ambulatory Visit (HOSPITAL_COMMUNITY)
Admission: RE | Admit: 2015-11-15 | Discharge: 2015-11-15 | Disposition: A | Discharge status: Home / Self Care | Payer: 59 | Source: Ambulatory Visit | Attending: Orthopedic Surgery | Admitting: Orthopedic Surgery

## 2015-11-15 DIAGNOSIS — M75102 Unspecified rotator cuff tear or rupture of left shoulder, not specified as traumatic: Secondary | ICD-10-CM | POA: Diagnosis not present

## 2015-11-15 DIAGNOSIS — M25512 Pain in left shoulder: Secondary | ICD-10-CM | POA: Diagnosis present

## 2015-11-15 MED ORDER — LIDOCAINE HCL (PF) 1 % IJ SOLN
INTRAMUSCULAR | Status: AC
  Filled 2015-11-15: qty 10

## 2015-11-15 MED ORDER — GADOBENATE DIMEGLUMINE 529 MG/ML IV SOLN
5.0000 mL | Freq: Once | INTRAVENOUS | Status: DC | PRN
  Administered 2015-11-15: 0.05 mL via INTRA_ARTICULAR
  Filled 2015-11-15: qty 5

## 2015-11-15 MED ORDER — IOHEXOL 180 MG/ML  SOLN
20.0000 mL | Freq: Once | INTRAMUSCULAR | Status: DC | PRN
  Administered 2015-11-15: 8 mL via INTRA_ARTICULAR
  Filled 2015-11-15: qty 20

## 2015-11-27 ENCOUNTER — Other Ambulatory Visit: Payer: Self-pay | Admitting: Physician Assistant

## 2015-11-27 NOTE — H&P (Signed)
Zachary [name] comes in for follow up.  I have gone over recent repeat MRI.  He continues to have significant symptoms that he would like to address.  Subsequent MRI shows recurrent tear posterior aspect of his cuff.  He previously had a massive tear entire supraspinatus with a significant amount of intratendinous change and retraction.  I have looked at the old scan and the new one.  The sutures and anchors from his repair over the anterior two-thirds of the supraspinatus still look good.  The area of full thickness tear is actually behind what we had fixed.    DISPOSITION:  I spent more than 30 minutes with Zachary [name] and his wife.  I have again reinforced that the issue here is tissue quality.  I think we should make an effort to try to fix this recurrent or new tear in the back of the supraspinatus.  He has already had a good decompression.  Procedure, risks, benefits and complications reviewed.  Even if we fix this new tear I still remain concerned about the long run and his cuff holding up.  If we don't do something now however I think things are just going to get worse.  He already has rest pain and night pain and has gone downhill over the last couple of weeks after initial improvement.  Paperwork complete.  All questions answered.  See him at the time of operative intervention.    Ninetta Lights, M.D.

## 2015-12-04 ENCOUNTER — Encounter (HOSPITAL_BASED_OUTPATIENT_CLINIC_OR_DEPARTMENT_OTHER): Payer: Self-pay | Admitting: *Deleted

## 2015-12-06 ENCOUNTER — Other Ambulatory Visit: Payer: Self-pay | Admitting: Family Medicine

## 2015-12-07 ENCOUNTER — Ambulatory Visit (HOSPITAL_BASED_OUTPATIENT_CLINIC_OR_DEPARTMENT_OTHER): Payer: 59 | Admitting: Anesthesiology

## 2015-12-07 ENCOUNTER — Encounter (HOSPITAL_BASED_OUTPATIENT_CLINIC_OR_DEPARTMENT_OTHER): Payer: Self-pay

## 2015-12-07 ENCOUNTER — Ambulatory Visit (HOSPITAL_BASED_OUTPATIENT_CLINIC_OR_DEPARTMENT_OTHER)
Admission: RE | Admit: 2015-12-07 | Discharge: 2015-12-07 | Disposition: A | Discharge status: Home / Self Care | Payer: 59 | Source: Ambulatory Visit | Attending: Orthopedic Surgery | Admitting: Orthopedic Surgery

## 2015-12-07 ENCOUNTER — Encounter (HOSPITAL_BASED_OUTPATIENT_CLINIC_OR_DEPARTMENT_OTHER)
Admission: RE | Disposition: A | Discharge status: Home / Self Care | Payer: Self-pay | Source: Ambulatory Visit | Attending: Orthopedic Surgery

## 2015-12-07 DIAGNOSIS — I10 Essential (primary) hypertension: Secondary | ICD-10-CM | POA: Diagnosis not present

## 2015-12-07 DIAGNOSIS — M1990 Unspecified osteoarthritis, unspecified site: Secondary | ICD-10-CM | POA: Insufficient documentation

## 2015-12-07 DIAGNOSIS — G4730 Sleep apnea, unspecified: Secondary | ICD-10-CM | POA: Diagnosis not present

## 2015-12-07 DIAGNOSIS — K219 Gastro-esophageal reflux disease without esophagitis: Secondary | ICD-10-CM | POA: Insufficient documentation

## 2015-12-07 DIAGNOSIS — M75102 Unspecified rotator cuff tear or rupture of left shoulder, not specified as traumatic: Secondary | ICD-10-CM | POA: Diagnosis not present

## 2015-12-07 DIAGNOSIS — E039 Hypothyroidism, unspecified: Secondary | ICD-10-CM | POA: Insufficient documentation

## 2015-12-07 HISTORY — PX: SHOULDER ARTHROSCOPY WITH ROTATOR CUFF REPAIR: SHX5685

## 2015-12-07 HISTORY — DX: Other articular cartilage disorders, unspecified site: M24.10

## 2015-12-07 SURGERY — ARTHROSCOPY, SHOULDER, WITH ROTATOR CUFF REPAIR
Anesthesia: Regional | Site: Shoulder | Laterality: Left

## 2015-12-07 MED ORDER — METOCLOPRAMIDE HCL 5 MG/ML IJ SOLN: 5.0000 mg | Freq: Three times a day (TID) | INTRAMUSCULAR | Status: DC | PRN

## 2015-12-07 MED ORDER — SODIUM CHLORIDE 0.9 % IR SOLN
Status: DC | PRN
  Administered 2015-12-07: 6000 mL

## 2015-12-07 MED ORDER — HYDROMORPHONE HCL 2 MG PO TABS: ORAL_TABLET | ORAL | Status: DC

## 2015-12-07 MED ORDER — FENTANYL CITRATE (PF) 100 MCG/2ML IJ SOLN
INTRAMUSCULAR | Status: AC
  Filled 2015-12-07: qty 2

## 2015-12-07 MED ORDER — FENTANYL CITRATE (PF) 100 MCG/2ML IJ SOLN: 25.0000 ug | INTRAMUSCULAR | Status: DC | PRN

## 2015-12-07 MED ORDER — METOCLOPRAMIDE HCL 5 MG PO TABS: 5.0000 mg | ORAL_TABLET | Freq: Three times a day (TID) | ORAL | Status: DC | PRN

## 2015-12-07 MED ORDER — EPHEDRINE SULFATE 50 MG/ML IJ SOLN
INTRAMUSCULAR | Status: DC | PRN
  Administered 2015-12-07: 15 mg via INTRAVENOUS

## 2015-12-07 MED ORDER — METHOCARBAMOL 1000 MG/10ML IJ SOLN: 500.0000 mg | Freq: Four times a day (QID) | INTRAVENOUS | Status: DC | PRN

## 2015-12-07 MED ORDER — SCOPOLAMINE 1 MG/3DAYS TD PT72: 1.0000 | MEDICATED_PATCH | Freq: Once | TRANSDERMAL | Status: DC

## 2015-12-07 MED ORDER — PROMETHAZINE HCL 25 MG/ML IJ SOLN: 6.2500 mg | INTRAMUSCULAR | Status: DC | PRN

## 2015-12-07 MED ORDER — HYDROMORPHONE HCL 1 MG/ML IJ SOLN: 0.5000 mg | INTRAMUSCULAR | Status: DC | PRN

## 2015-12-07 MED ORDER — PROPOFOL 10 MG/ML IV BOLUS
INTRAVENOUS | Status: DC | PRN
  Administered 2015-12-07: 150 mg via INTRAVENOUS

## 2015-12-07 MED ORDER — EPHEDRINE SULFATE 50 MG/ML IJ SOLN
INTRAMUSCULAR | Status: AC
  Filled 2015-12-07: qty 1

## 2015-12-07 MED ORDER — SUCCINYLCHOLINE CHLORIDE 20 MG/ML IJ SOLN
INTRAMUSCULAR | Status: DC | PRN
  Administered 2015-12-07: 100 mg via INTRAVENOUS

## 2015-12-07 MED ORDER — LACTATED RINGERS IV SOLN
INTRAVENOUS | Status: DC
  Administered 2015-12-07: 12:00:00 via INTRAVENOUS

## 2015-12-07 MED ORDER — MIDAZOLAM HCL 2 MG/2ML IJ SOLN
1.0000 mg | INTRAMUSCULAR | Status: DC | PRN
  Administered 2015-12-07: 2 mg via INTRAVENOUS

## 2015-12-07 MED ORDER — ONDANSETRON HCL 4 MG/2ML IJ SOLN: 4.0000 mg | Freq: Four times a day (QID) | INTRAMUSCULAR | Status: DC | PRN

## 2015-12-07 MED ORDER — ONDANSETRON HCL 4 MG PO TABS: 4.0000 mg | ORAL_TABLET | Freq: Four times a day (QID) | ORAL | Status: DC | PRN

## 2015-12-07 MED ORDER — ONDANSETRON HCL 4 MG/2ML IJ SOLN
INTRAMUSCULAR | Status: DC | PRN
  Administered 2015-12-07: 4 mg via INTRAVENOUS

## 2015-12-07 MED ORDER — FENTANYL CITRATE (PF) 100 MCG/2ML IJ SOLN
50.0000 ug | INTRAMUSCULAR | Status: DC | PRN
  Administered 2015-12-07 (×2): 100 ug via INTRAVENOUS

## 2015-12-07 MED ORDER — LIDOCAINE HCL (CARDIAC) 20 MG/ML IV SOLN
INTRAVENOUS | Status: DC | PRN
  Administered 2015-12-07: 100 mg via INTRAVENOUS

## 2015-12-07 MED ORDER — METHOCARBAMOL 500 MG PO TABS: 500.0000 mg | ORAL_TABLET | Freq: Four times a day (QID) | ORAL | Status: DC | PRN

## 2015-12-07 MED ORDER — CHLORHEXIDINE GLUCONATE 4 % EX LIQD: 60.0000 mL | Freq: Once | CUTANEOUS | Status: DC

## 2015-12-07 MED ORDER — CEFAZOLIN SODIUM-DEXTROSE 2-3 GM-% IV SOLR
2.0000 g | INTRAVENOUS | Status: AC
  Administered 2015-12-07: 2 g via INTRAVENOUS

## 2015-12-07 MED ORDER — BUPIVACAINE-EPINEPHRINE (PF) 0.5% -1:200000 IJ SOLN
INTRAMUSCULAR | Status: DC | PRN
  Administered 2015-12-07: 20 mL via PERINEURAL

## 2015-12-07 MED ORDER — DEXAMETHASONE SODIUM PHOSPHATE 4 MG/ML IJ SOLN
INTRAMUSCULAR | Status: DC | PRN
  Administered 2015-12-07: 10 mg via INTRAVENOUS

## 2015-12-07 MED ORDER — GLYCOPYRROLATE 0.2 MG/ML IJ SOLN: 0.2000 mg | Freq: Once | INTRAMUSCULAR | Status: DC | PRN

## 2015-12-07 MED ORDER — MIDAZOLAM HCL 2 MG/2ML IJ SOLN
INTRAMUSCULAR | Status: AC
  Filled 2015-12-07: qty 2

## 2015-12-07 MED ORDER — CEFAZOLIN SODIUM-DEXTROSE 2-3 GM-% IV SOLR
INTRAVENOUS | Status: AC
  Filled 2015-12-07: qty 50

## 2015-12-07 SURGICAL SUPPLY — 72 items
ANCHOR SUT BIO SW 4.75X19.1 (Anchor) ×9 IMPLANT
BENZOIN TINCTURE PRP APPL 2/3 (GAUZE/BANDAGES/DRESSINGS) IMPLANT
BLADE CUTTER GATOR 3.5 (BLADE) ×3 IMPLANT
BLADE CUTTER MENIS 5.5 (BLADE) IMPLANT
BLADE GREAT WHITE 4.2 (BLADE) ×2 IMPLANT
BLADE GREAT WHITE 4.2MM (BLADE) ×1
BLADE SURG 15 STRL LF DISP TIS (BLADE) IMPLANT
BLADE SURG 15 STRL SS (BLADE)
BUR OVAL 6.0 (BURR) IMPLANT
CANNULA DRY DOC 8X75 (CANNULA) IMPLANT
CANNULA TWIST IN 8.25X7CM (CANNULA) IMPLANT
CLOSURE WOUND 1/2 X4 (GAUZE/BANDAGES/DRESSINGS) ×1
DECANTER SPIKE VIAL GLASS SM (MISCELLANEOUS) IMPLANT
DRAPE OEC MINIVIEW 54X84 (DRAPES) IMPLANT
DRAPE STERI 35X30 U-POUCH (DRAPES) ×3 IMPLANT
DRAPE U-SHAPE 47X51 STRL (DRAPES) ×3 IMPLANT
DRAPE U-SHAPE 76X120 STRL (DRAPES) ×6 IMPLANT
DRSG PAD ABDOMINAL 8X10 ST (GAUZE/BANDAGES/DRESSINGS) ×3 IMPLANT
DURAPREP 26ML APPLICATOR (WOUND CARE) ×3 IMPLANT
ELECT MENISCUS 165MM 90D (ELECTRODE) ×3 IMPLANT
ELECT REM PT RETURN 9FT ADLT (ELECTROSURGICAL) ×3
ELECTRODE REM PT RTRN 9FT ADLT (ELECTROSURGICAL) ×1 IMPLANT
GAUZE SPONGE 4X4 12PLY STRL (GAUZE/BANDAGES/DRESSINGS) ×6 IMPLANT
GAUZE XEROFORM 1X8 LF (GAUZE/BANDAGES/DRESSINGS) ×3 IMPLANT
GLOVE BIO SURGEON STRL SZ 6.5 (GLOVE) ×2 IMPLANT
GLOVE BIO SURGEONS STRL SZ 6.5 (GLOVE) ×1
GLOVE BIOGEL PI IND STRL 7.0 (GLOVE) ×2 IMPLANT
GLOVE BIOGEL PI INDICATOR 7.0 (GLOVE) ×4
GLOVE ECLIPSE 7.0 STRL STRAW (GLOVE) ×3 IMPLANT
GLOVE SURG ORTHO 8.0 STRL STRW (GLOVE) ×3 IMPLANT
GOWN STRL REUS W/ TWL LRG LVL3 (GOWN DISPOSABLE) ×2 IMPLANT
GOWN STRL REUS W/ TWL XL LVL3 (GOWN DISPOSABLE) ×1 IMPLANT
GOWN STRL REUS W/TWL LRG LVL3 (GOWN DISPOSABLE) ×4
GOWN STRL REUS W/TWL XL LVL3 (GOWN DISPOSABLE) ×2
IV NS IRRIG 3000ML ARTHROMATIC (IV SOLUTION) ×12 IMPLANT
MANIFOLD NEPTUNE II (INSTRUMENTS) ×3 IMPLANT
NDL SUT 6 .5 CRC .975X.05 MAYO (NEEDLE) IMPLANT
NEEDLE MAYO TAPER (NEEDLE)
NEEDLE SCORPION MULTI FIRE (NEEDLE) ×9 IMPLANT
NS IRRIG 1000ML POUR BTL (IV SOLUTION) IMPLANT
PACK ARTHROSCOPY DSU (CUSTOM PROCEDURE TRAY) ×3 IMPLANT
PACK BASIN DAY SURGERY FS (CUSTOM PROCEDURE TRAY) ×3 IMPLANT
PASSER SUT SWANSON 36MM LOOP (INSTRUMENTS) IMPLANT
PENCIL BUTTON HOLSTER BLD 10FT (ELECTRODE) ×3 IMPLANT
SET ARTHROSCOPY TUBING (MISCELLANEOUS) ×2
SET ARTHROSCOPY TUBING LN (MISCELLANEOUS) ×1 IMPLANT
SLEEVE SCD COMPRESS KNEE MED (MISCELLANEOUS) ×3 IMPLANT
SLING ARM FOAM STRAP LRG (SOFTGOODS) IMPLANT
SLING ARM IMMOBILIZER LRG (SOFTGOODS) IMPLANT
SLING ARM IMMOBILIZER MED (SOFTGOODS) IMPLANT
SLING ARM MED ADULT FOAM STRAP (SOFTGOODS) IMPLANT
SLING ARM XL FOAM STRAP (SOFTGOODS) IMPLANT
SPONGE LAP 4X18 X RAY DECT (DISPOSABLE) ×3 IMPLANT
STRIP CLOSURE SKIN 1/2X4 (GAUZE/BANDAGES/DRESSINGS) ×2 IMPLANT
SUCTION FRAZIER TIP 10 FR DISP (SUCTIONS) IMPLANT
SUT ETHIBOND 2 OS 4 DA (SUTURE) IMPLANT
SUT ETHILON 2 0 FS 18 (SUTURE) IMPLANT
SUT ETHILON 3 0 PS 1 (SUTURE) ×3 IMPLANT
SUT FIBERWIRE #2 38 T-5 BLUE (SUTURE)
SUT RETRIEVER MED (INSTRUMENTS) IMPLANT
SUT TIGER TAPE 7 IN WHITE (SUTURE) ×6 IMPLANT
SUT VIC AB 0 CT1 27 (SUTURE)
SUT VIC AB 0 CT1 27XBRD ANBCTR (SUTURE) IMPLANT
SUT VIC AB 2-0 SH 27 (SUTURE)
SUT VIC AB 2-0 SH 27XBRD (SUTURE) IMPLANT
SUT VIC AB 3-0 FS2 27 (SUTURE) IMPLANT
SUTURE FIBERWR #2 38 T-5 BLUE (SUTURE) IMPLANT
TAPE FIBER 2MM 7IN #2 BLUE (SUTURE) IMPLANT
TOWEL OR 17X24 6PK STRL BLUE (TOWEL DISPOSABLE) ×3 IMPLANT
TOWEL OR NON WOVEN STRL DISP B (DISPOSABLE) ×3 IMPLANT
WATER STERILE IRR 1000ML POUR (IV SOLUTION) ×3 IMPLANT
YANKAUER SUCT BULB TIP NO VENT (SUCTIONS) IMPLANT

## 2015-12-07 NOTE — Anesthesia Procedure Notes (Addendum)
Anesthesia Regional Block:  Interscalene brachial plexus block  Pre-Anesthetic Checklist: ,, timeout performed, Correct Patient, Correct Site, Correct Laterality, Correct Procedure, Correct Position, site marked, Risks and benefits discussed,  Surgical consent,  Pre-op evaluation,  At surgeon's request and post-op pain management  Laterality: Left  Prep: chloraprep       Needles:  Injection technique: Single-shot  Needle Type: Echogenic Stimulator Needle     Needle Length: 9cm 9 cm Needle Gauge: 22 and 22 G    Additional Needles:  Procedures: ultrasound guided (picture in chart) Interscalene brachial plexus block Narrative:  Start time: 12/07/2015 12:26 PM End time: 12/07/2015 12:28 PM Injection made incrementally with aspirations every 5 mL.  Performed by: Personally  Anesthesiologist: Catalina Gravel  Additional Notes: Functioning IV was confirmed and monitors were applied.  A 54mm 22ga Arrow echogenic stimulator needle was used. Sterile prep,hand hygiene and sterile gloves were used.  Negative aspiration and negative test dose prior to incremental administration of local anesthetic. The patient tolerated the procedure well.  Ultrasound guidance: relevent anatomy identified, needle position confirmed, local anesthetic spread visualized around nerve(s), vascular puncture avoided.  Image printed for medical record.    Procedure Name: Intubation Performed by: Terrance Mass Pre-anesthesia Checklist: Patient identified, Emergency Drugs available, Suction available and Patient being monitored Patient Re-evaluated:Patient Re-evaluated prior to inductionOxygen Delivery Method: Circle System Utilized Preoxygenation: Pre-oxygenation with 100% oxygen Intubation Type: IV induction Ventilation: Mask ventilation without difficulty Laryngoscope Size: Miller and 2 Grade View: Grade I Tube type: Oral Number of attempts: 1 Airway Equipment and Method: Stylet and Oral  airway Placement Confirmation: ETT inserted through vocal cords under direct vision,  positive ETCO2 and breath sounds checked- equal and bilateral Secured at: 23 cm Tube secured with: Tape Dental Injury: Teeth and Oropharynx as per pre-operative assessment

## 2015-12-07 NOTE — Interval H&P Note (Signed)
History and Physical Interval Note:  12/07/2015 7:34 AM  Zachary [name]  has presented today for surgery, with the diagnosis of [place] DIS;ORDERSL,STRAI OF MUSCLE AND TENDONS OF THE ROTATOR CUFF  The various methods of treatment have been discussed with the patient and family. After consideration of risks, benefits and other options for treatment, the patient has consented to  Procedure(s): LEFT SHOULDER ARTHROSCOPY DEBRIDEMENT, WITH ROTATOR CUFF REPAIR (Left) as a surgical intervention .  The patient's history has been reviewed, patient examined, no change in status, stable for surgery.  I have reviewed the patient's chart and labs.  Questions were answered to the patient's satisfaction.     Ninetta Lights

## 2015-12-07 NOTE — Anesthesia Preprocedure Evaluation (Signed)
Anesthesia Evaluation  Patient identified by MRN, date of birth, ID band Patient awake    Reviewed: Allergy & Precautions, NPO status , Patient's Chart, lab work & pertinent test results  History of Anesthesia Complications Negative for: history of anesthetic complications  Airway Mallampati: I  TM Distance: >3 FB Neck ROM: Full    Dental  (+) Teeth Intact, Dental Advisory Given   Pulmonary sleep apnea and Continuous Positive Airway Pressure Ventilation ,    Pulmonary exam normal breath sounds clear to auscultation       Cardiovascular Exercise Tolerance: Good hypertension, Pt. on medications (-) angina(-) CAD and (-) Past MI Normal cardiovascular exam Rhythm:Regular Rate:Normal     Neuro/Psych negative neurological ROS  negative psych ROS   GI/Hepatic Neg liver ROS, GERD  Medicated and Controlled,  Endo/Other  Hypothyroidism Obesity   Renal/GU negative Renal ROS     Musculoskeletal  (+) Arthritis , Osteoarthritis,    Abdominal   Peds  Hematology negative hematology ROS (+)   Anesthesia Other Findings Day of surgery medications reviewed with the patient.  Reproductive/Obstetrics                             Anesthesia Physical Anesthesia Plan  ASA: III  Anesthesia Plan: General and Regional   Post-op Pain Management: GA combined w/ Regional for post-op pain   Induction: Intravenous  Airway Management Planned: Oral ETT  Additional Equipment:   Intra-op Plan:   Post-operative Plan: Extubation in OR  Informed Consent: I have reviewed the patients History and Physical, chart, labs and discussed the procedure including the risks, benefits and alternatives for the proposed anesthesia with the patient or authorized representative who has indicated his/her understanding and acceptance.   Dental advisory given  Plan Discussed with: CRNA, Anesthesiologist and Surgeon  Anesthesia  Plan Comments: (GA + Interscalene nerve block)        Anesthesia Quick Evaluation

## 2015-12-07 NOTE — Progress Notes (Signed)
Assisted Dr. Gifford Shave with left, ultrasound guided, interscalene  block. Side rails up, monitors on throughout procedure. See vital signs in flow sheet. Tolerated Procedure well.

## 2015-12-07 NOTE — Discharge Instructions (Signed)
Shouder arthroscopy, rotator cuff repair, subacromial decompression °Care After Instructions °Refer to this sheet in the next few weeks. These discharge instructions provide you with general information on caring for yourself after you leave the hospital. Your caregiver may also give you specific instructions. Your treatment has been planned according to the most current medical practices available, but unavoidable complications sometimes occur. If you have any problems or questions after discharge, please call your caregiver. °HOME INSTRUCTIONS °You may resume a normal diet and activities as directed.  °Take showers instead of baths until informed otherwise.  °Change bandages (dressings) in 3 days.  Swab wounds daily with betadine.  Wash shoulder with soap and water.  Pat dry.  Cover wounds with bandaids. °Only take over-the-counter or prescription medicines for pain, discomfort, or fever as directed by your caregiver.  °Wear your sling for the next 6 weeks unless otherwise instructed. °Eat a well-balanced diet.  °Avoid lifting or driving until you are instructed otherwise.  °Make an appointment to see your caregiver for stitches (suture) or staple removal one week after surgery.  ° °SEEK MEDICAL CARE IF: °You have swelling of your calf or leg.  °You develop shortness of breath or chest pain.  °You have redness, swelling, or increasing pain in the wound.  °There is pus or any unusual drainage coming from the surgical site.  °You notice a bad smell coming from the surgical site or dressing.  °The surgical site breaks open after sutures or staples have been removed.  °There is persistent bleeding from the suture or staple line.  °You are getting worse or are not improving.  °You have any other questions or concerns.  °SEEK IMMEDIATE MEDICAL CARE IF:  °You have a fever greater than 101 °You develop a rash.  °You have difficulty breathing.  °You develop any reaction or side effects to medicines given.  °Your knee  motion is decreasing rather than improving.  °MAKE SURE YOU:  °Understand these instructions.  °Will watch your condition.  °Will get help right away if you are not doing well or get worse.  ° ° °Post Anesthesia Home Care Instructions ° °Activity: °Get plenty of rest for the remainder of the day. A responsible adult should stay with you for 24 hours following the procedure.  °For the next 24 hours, DO NOT: °-Drive a car °-Operate machinery °-Drink alcoholic beverages °-Take any medication unless instructed by your physician °-Make any legal decisions or sign important papers. ° °Meals: °Start with liquid foods such as gelatin or soup. Progress to regular foods as tolerated. Avoid greasy, spicy, heavy foods. If nausea and/or vomiting occur, drink only clear liquids until the nausea and/or vomiting subsides. Call your physician if vomiting continues. ° °Special Instructions/Symptoms: °Your throat may feel dry or sore from the anesthesia or the breathing tube placed in your throat during surgery. If this causes discomfort, gargle with warm salt water. The discomfort should disappear within 24 hours. ° °If you had a scopolamine patch placed behind your ear for the management of post- operative nausea and/or vomiting: ° °1. The medication in the patch is effective for 72 hours, after which it should be removed.  Wrap patch in a tissue and discard in the trash. Wash hands thoroughly with soap and water. °2. You may remove the patch earlier than 72 hours if you experience unpleasant side effects which may include dry mouth, dizziness or visual disturbances. °3. Avoid touching the patch. Wash your hands with soap and water after contact with   the patch. °  °Regional Anesthesia Blocks ° °1. Numbness or the inability to move the "blocked" extremity may last from 3-48 hours after placement. The length of time depends on the medication injected and your individual response to the medication. If the numbness is not going away  after 48 hours, call your surgeon. ° °2. The extremity that is blocked will need to be protected until the numbness is gone and the  Strength has returned. Because you cannot feel it, you will need to take extra care to avoid injury. Because it may be weak, you may have difficulty moving it or using it. You may not know what position it is in without looking at it while the block is in effect. ° °3. For blocks in the legs and feet, returning to weight bearing and walking needs to be done carefully. You will need to wait until the numbness is entirely gone and the strength has returned. You should be able to move your leg and foot normally before you try and bear weight or walk. You will need someone to be with you when you first try to ensure you do not fall and possibly risk injury. ° °4. Bruising and tenderness at the needle site are common side effects and will resolve in a few days. ° °5. Persistent numbness or new problems with movement should be communicated to the surgeon or the [facility] ([phone])/ [facility] ([phone]). ° °

## 2015-12-07 NOTE — Anesthesia Postprocedure Evaluation (Signed)
Anesthesia Post Note  Patient: Zachary [name]  Procedure(s) Performed: Procedure(s) (LRB): LEFT SHOULDER ARTHROSCOPY DEBRIDEMENT, WITH ROTATOR CUFF REPAIR (Left)  Patient location during evaluation: PACU Anesthesia Type: Regional and General Level of consciousness: awake and alert Pain management: pain level controlled Vital Signs Assessment: post-procedure vital signs reviewed and stable Respiratory status: spontaneous breathing Cardiovascular status: blood pressure returned to baseline Anesthetic complications: no    Last Vitals:  Filed Vitals:   12/07/15 1615 12/07/15 1630  BP: 117/83 119/87  Pulse: 78 81  Temp:    Resp: 18 18    Last Pain: There were no vitals filed for this visit.               Tiajuana Amass

## 2015-12-07 NOTE — Transfer of Care (Signed)
Immediate Anesthesia Transfer of Care Note  Patient: Zachary [name]  Procedure(s) Performed: Procedure(s): LEFT SHOULDER ARTHROSCOPY DEBRIDEMENT, WITH ROTATOR CUFF REPAIR (Left)  Patient Location: PACU  Anesthesia Type:General  Level of Consciousness: awake and sedated  Airway & Oxygen Therapy: Patient Spontanous Breathing and Patient connected to face mask oxygen  Post-op Assessment: Report given to RN and Post -op Vital signs reviewed and stable  Post vital signs: Reviewed and stable  Last Vitals:  Filed Vitals:   12/07/15 1245 12/07/15 1247  BP: 126/93 126/93  Pulse: 65 65  Temp:    Resp: 17 15    Complications: No apparent anesthesia complications

## 2015-12-08 ENCOUNTER — Encounter (HOSPITAL_BASED_OUTPATIENT_CLINIC_OR_DEPARTMENT_OTHER): Payer: Self-pay | Admitting: Orthopedic Surgery

## 2015-12-08 NOTE — Op Note (Signed)
NAME:  LEIGHTON, RODNEY NO.:  [id number]  MEDICAL RECORD NO.:  [id number]  LOCATION:                               FACILITY:  [facility]  PHYSICIAN:  Ninetta Lights, M.D. DATE OF BIRTH:  [birth year]  DATE OF PROCEDURE:  12/07/2015 DATE OF DISCHARGE:  12/07/2015                              OPERATIVE REPORT   PREOPERATIVE DIAGNOSIS:  Left shoulder recurrent rotator cuff tear after repair and decompression earlier this year.  Minimal traumatic.  POSTOPERATIVE DIAGNOSIS:  Left shoulder recurrent rotator cuff tear after repair and decompression earlier this year.  Minimal traumatic. Marked attrition and failure of repair because of tissue quality. Anchor and sutures still in place.  Some mild adhesions.  PROCEDURE:  Left shoulder exam, manipulation under anesthesia. Arthroscopy with lysis of adhesions.  Remobilization of rotator cuff. Re-repair utilizing FiberWeave suture x2, FiberWire x1 and three SwiveLock anchors.  SURGEON:  Ninetta Lights, M.D.  ASSISTANT:  Doran Stabler, PA, present throughout the entire case and necessary for timely completion of procedure.  ANESTHESIA:  General.  BLOOD LOSS:  Minimal.  SPECIMENS:  None.  CULTURES:  None.  COMPLICATIONS:  None.  DRESSINGS:  Soft compressive with shoulder immobilizer.  DESCRIPTION OF PROCEDURE:  The patient was brought to the operating room and after adequate anesthesia had been obtained, left shoulder was examined.  Mild adhesions lacking about 20% of motion.  This was manipulated, break up adhesions achieving full motion, stable shoulder. Beach-chair position.  Prepped and draped in usual sterile fashion.  Two portals; posterior and lateral.  Arthroscope was introduced, shoulder was distended and inspected.  Articular cartilage, labrum and biceps tendon still intact.  The entire crescent region rotator cuff supraspinatus, the back anchor and the infraspinatus had just thinned out and pulled away  from suture.  Part of this was still connected, but for the most part, all the tissue there was terrible.  Sutures were removed.  The cuff debrided, mobilized as much as possible.  I then tried to place suture as medial as I could within the cuff.  I used three mattress sutures; two label tapes above and one FiberWire in the back.  These were brought over and anchored down into predrilled holes in the tuberosity with three SwiveLock anchors.  At completion, once again, I had a nice solid repair achieved.  Adequacy of decompression was already adequate. Instruments and fluids removed.  Portals were closed with nylon. Sterile compressive dressing applied.  Sling applied.  Anesthesia reversed.  Brought to the recovery room.  Tolerated the surgery well. No complications.     Ninetta Lights, M.D.     DFM/MEDQ  D:  12/07/2015  T:  12/08/2015  Job:  [id number]

## 2015-12-12 ENCOUNTER — Encounter (HOSPITAL_BASED_OUTPATIENT_CLINIC_OR_DEPARTMENT_OTHER): Payer: Self-pay | Admitting: Orthopedic Surgery

## 2015-12-13 ENCOUNTER — Telehealth: Payer: Self-pay | Admitting: Family Medicine

## 2015-12-13 NOTE — Telephone Encounter (Signed)
Pt is a [employer] employee and received his shot in Oct.

## 2015-12-13 NOTE — Telephone Encounter (Signed)
Chart updated

## 2015-12-15 ENCOUNTER — Telehealth: Payer: Self-pay | Admitting: Family Medicine

## 2015-12-15 MED ORDER — PRAVASTATIN SODIUM 20 MG PO TABS: 20.0000 mg | ORAL_TABLET | Freq: Every day | ORAL | Status: DC

## 2015-12-15 NOTE — Telephone Encounter (Signed)
Changing pravastatin formulation

## 2015-12-21 ENCOUNTER — Ambulatory Visit: Payer: 59 | Attending: Orthopedic Surgery | Admitting: Physical Therapy

## 2015-12-21 DIAGNOSIS — M25612 Stiffness of left shoulder, not elsewhere classified: Secondary | ICD-10-CM | POA: Insufficient documentation

## 2015-12-21 DIAGNOSIS — R29898 Other symptoms and signs involving the musculoskeletal system: Secondary | ICD-10-CM | POA: Insufficient documentation

## 2015-12-21 DIAGNOSIS — M25512 Pain in left shoulder: Secondary | ICD-10-CM | POA: Insufficient documentation

## 2015-12-22 ENCOUNTER — Ambulatory Visit: Payer: 59 | Attending: Orthopedic Surgery | Admitting: Physical Therapy

## 2015-12-22 DIAGNOSIS — R29898 Other symptoms and signs involving the musculoskeletal system: Secondary | ICD-10-CM

## 2015-12-22 DIAGNOSIS — M25612 Stiffness of left shoulder, not elsewhere classified: Secondary | ICD-10-CM

## 2015-12-22 DIAGNOSIS — M25512 Pain in left shoulder: Secondary | ICD-10-CM | POA: Diagnosis not present

## 2015-12-22 NOTE — Therapy (Signed)
[facility] [address]  [place], [place], [address] Phone: [phone]   Fax:  [phone]  Physical Therapy Treatment  Patient Details  Name: Zachary [name] MRN: [id number] Date of Birth: 06/29/1951 Referring Provider: Kathryne Hitch  Encounter Date: 12/22/2015      PT End of Session - 12/22/15 0714    Visit Number 2   Number of Visits 20   Date for PT Re-Evaluation 02/29/16   PT Start Time 0713   PT Stop Time 0742   PT Time Calculation (min) 29 min      Past Medical History  Diagnosis Date  . Hyperlipidemia   . Hypertension   . Insomnia   . Hypothyroidism   . GERD (gastroesophageal reflux disease)   . Arthritis   . Hemorrhoids   . Hearing problem     hearing deficit  . Diverticulitis   . Wears glasses   . Wears hearing aid     both ears  . Cancer ([place]) 2015    positive prostate cancer bx  . Sleep apnea     uses a cpap  . Articular cartilage disease     left shoulder    Past Surgical History  Procedure Laterality Date  . Hemorrhoid surgery  08/2006  . Back surgery  1978    herniated disksurgery  . Tendon repair  June 06, 2011    right elbow, Dr. Percell Miller  . Cardiac catheterization  08/01/2010  . Colonoscopy    . Shoulder arthroscopy with subacromial decompression, rotator cuff repair and bicep tendon repair Right 03/10/2014    Procedure: RIGHT SHOULDER ARTHROSCOPY WITH SUBACROMIAL DECOMPRESSION, PARTIAL ACROMIOPLASTY WITH CORACOAROMIAL LABRUM DEBRIDEMENT RELEASE DISTAL CLAVICULECTOMY,  ROTATOR CUFF REPAIR AND EXTENSIVE DEBRIDEMENT;  Surgeon: Ninetta Lights, MD;  Location: [place];  Service: Orthopedics;  Laterality: Right;  . Exam under anesthesia with manipulation of shoulder Right 09/15/2014    Procedure: RIGHT SHOULDER MANIPULATION UNDER ANESTHESIA;  Surgeon: Ninetta Lights, MD;  Location: [place];  Service: Orthopedics;  Laterality: Right;  .  Shoulder arthroscopy with rotator cuff repair and subacromial decompression Left 07/07/2015    Procedure: LEFT SHOULDER SCOPE DEBRIDEMENT, SUBACROMIAL DECOMPRESSION, DISTAL CLAVICULECTOMY, ROTATOR CUFF REPAIR  ;  Surgeon: Kathryne Hitch, MD;  Location: [place];  Service: Orthopedics;  Laterality: Left;  ANESTHESIA: GENERAL, PRE/POST OP SCALENE  . Shoulder arthroscopy with distal clavicle resection Left 07/07/2015    Procedure: SHOULDER ARTHROSCOPY WITH DISTAL CLAVICLE RESECTION;  Surgeon: Kathryne Hitch, MD;  Location: [place];  Service: Orthopedics;  Laterality: Left;  . Shoulder arthroscopy with rotator cuff repair Left 12/07/2015    Procedure: LEFT SHOULDER ARTHROSCOPY DEBRIDEMENT, WITH ROTATOR CUFF REPAIR;  Surgeon: Ninetta Lights, MD;  Location: [place];  Service: Orthopedics;  Laterality: Left;    There were no vitals filed for this visit.  Visit Diagnosis:  Left shoulder pain  Shoulder weakness  Shoulder stiffness, left      Subjective Assessment - 12/22/15 0714    Subjective states slept pretty good last night and pain so far this AM is 5/10.   Currently in Pain? Yes   Pain Score 5    Pain Location Shoulder   Pain Orientation Left;Lateral;Upper              TODAY'S TREATMENT Manual - Grade 1 AP mobs L GH in 0 to 30 ABD with gentle ER and IR PROM (improved  ER since yesterday's eval) Attempted Flexion PROM in supine but too guarded so switched to R side-lying with L UE resting on Blue bolster and pillow.  Performed TPR and STM to L Upper Trap and into infraspinatus to help decrease guarding and pain then flexion PROM.  Was able to achieve nearly 90 degrees of Flexion in this manner (was limited to 65 at initial eval yesterday).                     PT Short Term Goals - 12/22/15 0802    PT SHORT TERM GOAL #1   Title L shoulder PROM Flexion to 120, ER to 60 at 90 ABD, and IR to 45 at 90 ABD by 01/18/16    Status On-going           PT Long Term Goals - 12/22/15 0803    PT LONG TERM GOAL #1   Title L Shoulder AROM WFL all planes without limit by pain by 02/29/16   Status On-going   PT LONG TERM GOAL #2   Title L Shoulder MMT 4/5 or better all planes by 02/29/16   Status On-going   PT LONG TERM GOAL #3   Title pt able to return to all ADLs, chores, and work duties without limitation by shoulder pain, LOM, or weakness by 02/29/16   Status On-going   PT LONG TERM GOAL #4   Title pt able to sleep without limitation by shoulder pain by 02/29/16   Status On-going               Plan - 12/22/15 0806    Clinical Impression Statement good improvement today with PROM into ER and Flexion with chief limitation due to high level of guarding.  This much improved with R side-lying for flexion mobes/PROM.   PT Next Visit Plan manual therapy with grade 2 mobs and gentle PROM into Flexion and IR (no restrictions per protocol), ER to 30 in 0-30 ABD until POW3; progress slowly with protocol avoid ABD stretching/ROM until approx POW6   Consulted and Agree with Plan of Care Patient        Problem List Patient Active Problem List   Diagnosis Date Noted  . Complete rotator cuff tear of left shoulder 07/05/2015  . Tennis elbow 05/30/2014  . Rotator cuff (capsule) sprain 03/10/2014  . Right shoulder pain 02/11/2014  . Memory difficulty 12/18/2012  . Gynecomastia, male 07/30/2012  . OSA (obstructive sleep apnea) 04/23/2011  . EXTERNAL HEMORRHOIDS 02/12/2011  . Prostate cancer ([place]) 11/12/2010  . PLANTAR FASCIITIS 04/09/2010  . Hypothyroidism 12/08/2009  . NEOPLASM OF UNCERTAIN BEHAVIOR OF SKIN 03/10/2009  . OSTEOARTHRITIS 02/14/2009  . HYPERLIPIDEMIA 02/08/2009  . HEARING DEFICIT 02/08/2009  . Essential hypertension 02/08/2009  . ALLERGIC RHINITIS 02/08/2009  . GERD 02/08/2009  . PROTEINURIA 02/08/2009  . DIVERTICULITIS, HX OF 02/08/2009    Cloa Bushong PT, OCS 12/22/2015, 8:07 AM  [facility] [place], [place], [address] Phone: [phone]   Fax:  [phone]  Name: Zachary [name] MRN: [id number] Date of Birth: 08-Jun-1951

## 2015-12-22 NOTE — Therapy (Signed)
[facility] [address]  [place], [place], [address] Phone: [phone]   Fax:  [phone]  Physical Therapy Evaluation  Patient Details  Name: Zachary [name] MRN: [id number] Date of Birth: 28-May-1951 Referring Provider: Kathryne Hitch  Encounter Date: 12/21/2015      PT End of Session - 12/22/15 0714    Visit Number 2   Number of Visits 20   Date for PT Re-Evaluation 02/29/16   PT Start Time 0713      Past Medical History  Diagnosis Date  . Hyperlipidemia   . Hypertension   . Insomnia   . Hypothyroidism   . GERD (gastroesophageal reflux disease)   . Arthritis   . Hemorrhoids   . Hearing problem     hearing deficit  . Diverticulitis   . Wears glasses   . Wears hearing aid     both ears  . Cancer ([place]) 2015    positive prostate cancer bx  . Sleep apnea     uses a cpap  . Articular cartilage disease     left shoulder    Past Surgical History  Procedure Laterality Date  . Hemorrhoid surgery  08/2006  . Back surgery  1978    herniated disksurgery  . Tendon repair  June 06, 2011    right elbow, Dr. Percell Miller  . Cardiac catheterization  08/01/2010  . Colonoscopy    . Shoulder arthroscopy with subacromial decompression, rotator cuff repair and bicep tendon repair Right 03/10/2014    Procedure: RIGHT SHOULDER ARTHROSCOPY WITH SUBACROMIAL DECOMPRESSION, PARTIAL ACROMIOPLASTY WITH CORACOAROMIAL LABRUM DEBRIDEMENT RELEASE DISTAL CLAVICULECTOMY,  ROTATOR CUFF REPAIR AND EXTENSIVE DEBRIDEMENT;  Surgeon: Ninetta Lights, MD;  Location: [place];  Service: Orthopedics;  Laterality: Right;  . Exam under anesthesia with manipulation of shoulder Right 09/15/2014    Procedure: RIGHT SHOULDER MANIPULATION UNDER ANESTHESIA;  Surgeon: Ninetta Lights, MD;  Location: [place];  Service: Orthopedics;  Laterality: Right;  . Shoulder arthroscopy with rotator cuff repair and subacromial  decompression Left 07/07/2015    Procedure: LEFT SHOULDER SCOPE DEBRIDEMENT, SUBACROMIAL DECOMPRESSION, DISTAL CLAVICULECTOMY, ROTATOR CUFF REPAIR  ;  Surgeon: Kathryne Hitch, MD;  Location: [place];  Service: Orthopedics;  Laterality: Left;  ANESTHESIA: GENERAL, PRE/POST OP SCALENE  . Shoulder arthroscopy with distal clavicle resection Left 07/07/2015    Procedure: SHOULDER ARTHROSCOPY WITH DISTAL CLAVICLE RESECTION;  Surgeon: Kathryne Hitch, MD;  Location: [place];  Service: Orthopedics;  Laterality: Left;  . Shoulder arthroscopy with rotator cuff repair Left 12/07/2015    Procedure: LEFT SHOULDER ARTHROSCOPY DEBRIDEMENT, WITH ROTATOR CUFF REPAIR;  Surgeon: Ninetta Lights, MD;  Location: [place];  Service: Orthopedics;  Laterality: Left;    There were no vitals filed for this visit.  Visit Diagnosis:  Left shoulder pain  Shoulder weakness  Shoulder stiffness, left      Subjective Assessment - 12/21/15   Subjective Pt was being treated here for L RCR but was found to have re-injured and he underwent additional surgery on 12/07/15.  He returns to PT today to begin OPPT for L RCR. Since surgery he has been wearing sling 24 hours/day.  He states pain has been 7/10 on AVG lately.  He reports difficulty sleeping due to pain stating wakes frequently due to pain and may get 5 hours sleep/night.   Currently in Pain? Yes   Pain Score Constant pain 7/10 on AVG,  9/10 at worst noted at night   Pain Location Shoulder   Pain Orientation Left;Lateral;Upper            [facility] PT Assessment - 12/21/15 0001    Assessment   Medical Diagnosis SP L RCR   Referring Provider Kathryne Hitch   Onset Date/Surgical Date 12/07/15   Precautions   Precaution Comments per RCR protocol but slower and much more cautious   Required Braces or Orthoses Sling   Balance Screen   Has the patient fallen in the past 6 months No   Has the patient had a decrease in  activity level because of a fear of falling?  No   Is the patient reluctant to leave their home because of a fear of falling?  No   Observation/Other Assessments   Focus on Therapeutic Outcomes (FOTO)  81% limitation   PROM   Left Shoulder Flexion 65 Degrees   Left Shoulder ABduction 45 Degrees   Left Shoulder Internal Rotation --  to abdomen at 30 ABD   Left Shoulder External Rotation 20 Degrees  at 30 ABD          TODAY'S TREATMENT Manual - Grade 1 AP mobs L GH in 0 to 30 ABD with gentle ER and IR PROM; gentle mobes into Flexion supine. R Side-Lying L scapular mobs and STM/TRP L Upper Trap and into infraspinatus to help decrease guarding and pain.            PT Short Term Goals - 12/21/15 1655    PT SHORT TERM GOAL #1   Title L shoulder PROM Flexion to 120, ER to 60 at 90 ABD, and IR to 45 at 90 ABD by 01/18/16   Status New           PT Long Term Goals - 12/21/15 1400    PT LONG TERM GOAL #1   Title L Shoulder AROM WFL all planes without limit by pain by 02/29/16   Status New   PT LONG TERM GOAL #2   Title L Shoulder MMT 4/5 or better all planes by 02/29/16   Status New   PT LONG TERM GOAL #3   Title pt able to return to all ADLs, chores, and work duties without limitation by shoulder pain, LOM, or weakness by 02/29/16   Status New   PT LONG TERM GOAL #4   Title pt able to sleep without limitation by shoulder pain by 02/29/16   Status New               Plan - 12/21/15 1646    Clinical Impression Statement Zachary [name] returns to PT today for L RCR.  He was being seen here until November 2016 for L RCR but was found to have re-injured the supraspinatus portion of Left RC and had an additional surgical repair performed on 12/07/15.  He has been cleared to begin OPPT but MD has instructed to progress very slowly and cautiously within RCR protocol.  My plan is to not push shoulder ABD at all for the first few weeks and to focus on Flexion as well as ER and IR to  tolerance within protocol limits.  Zachary [name] is very guarded with L shoulder PROM and today's PROM are: ER to 20 at 30 ABD, ABD to 45, IR to abdomen at 30 ABD, and Flexion to 65.  He states his pain is constant in nature and rates 7/10 on AVG and states increases to 9/10 at times (stating worst  pain noted in bed).   Pt will benefit from skilled therapeutic intervention in order to improve on the following deficits Pain;Postural dysfunction;Decreased strength;Decreased mobility;Improper body mechanics;Decreased range of motion   Rehab Potential Good   PT Frequency 2x / week   PT Duration --  10 wks   PT Treatment/Interventions Manual techniques;Therapeutic exercise;Therapeutic activities;Patient/family education;Vasopneumatic Device;Taping;Electrical Stimulation;Cryotherapy   PT Next Visit Plan manual therapy with grade 2 mobs and gentle PROM into Flexion and IR (no restrictions per protocol), ER to 30 in 0-30 ABD until POW3; progress slowly with protocol avoid ABD stretching/ROM until approx POW6   Consulted and Agree with Plan of Care Patient         Problem List Patient Active Problem List   Diagnosis Date Noted  . Complete rotator cuff tear of left shoulder 07/05/2015  . Tennis elbow 05/30/2014  . Rotator cuff (capsule) sprain 03/10/2014  . Right shoulder pain 02/11/2014  . Memory difficulty 12/18/2012  . Gynecomastia, male 07/30/2012  . OSA (obstructive sleep apnea) 04/23/2011  . EXTERNAL HEMORRHOIDS 02/12/2011  . Prostate cancer ([place]) 11/12/2010  . PLANTAR FASCIITIS 04/09/2010  . Hypothyroidism 12/08/2009  . NEOPLASM OF UNCERTAIN BEHAVIOR OF SKIN 03/10/2009  . OSTEOARTHRITIS 02/14/2009  . HYPERLIPIDEMIA 02/08/2009  . HEARING DEFICIT 02/08/2009  . Essential hypertension 02/08/2009  . ALLERGIC RHINITIS 02/08/2009  . GERD 02/08/2009  . PROTEINURIA 02/08/2009  . DIVERTICULITIS, HX OF 02/08/2009    [facility] 12/22/2015, 8:00 AM  [facility] [address]  [place], [place], [address] Phone: [phone]   Fax:  [phone]  Name: Zachary [name] MRN: [id number] Date of Birth: 11-07-1951

## 2015-12-27 ENCOUNTER — Ambulatory Visit: Payer: 59 | Attending: Orthopedic Surgery | Admitting: Physical Therapy

## 2015-12-27 DIAGNOSIS — M25512 Pain in left shoulder: Secondary | ICD-10-CM | POA: Diagnosis not present

## 2015-12-27 DIAGNOSIS — M25612 Stiffness of left shoulder, not elsewhere classified: Secondary | ICD-10-CM | POA: Insufficient documentation

## 2015-12-27 DIAGNOSIS — R29898 Other symptoms and signs involving the musculoskeletal system: Secondary | ICD-10-CM | POA: Insufficient documentation

## 2015-12-27 MED FILL — LEVOTHYROXINE 100 MCG TAB: 100 | 90 days supply | Qty: 90 | Fill #1

## 2015-12-27 NOTE — Therapy (Signed)
[facility] [address]  [place], [place], [address] Phone: [phone]   Fax:  [phone]  Physical Therapy Treatment  Patient Details  Name: Zachary [name] MRN: [id number] Date of Birth: 24-Jun-1951 Referring Provider: Kathryne Hitch  Encounter Date: 12/27/2015      PT End of Session - 12/27/15 1632    Visit Number 3   Number of Visits 20   Date for PT Re-Evaluation 02/29/16   PT Start Time 30   PT Stop Time [id number]   PT Time Calculation (min) 29 min      Past Medical History  Diagnosis Date  . Hyperlipidemia   . Hypertension   . Insomnia   . Hypothyroidism   . GERD (gastroesophageal reflux disease)   . Arthritis   . Hemorrhoids   . Hearing problem     hearing deficit  . Diverticulitis   . Wears glasses   . Wears hearing aid     both ears  . Cancer ([place]) 2015    positive prostate cancer bx  . Sleep apnea     uses a cpap  . Articular cartilage disease     left shoulder    Past Surgical History  Procedure Laterality Date  . Hemorrhoid surgery  08/2006  . Back surgery  1978    herniated disksurgery  . Tendon repair  June 06, 2011    right elbow, Dr. Percell Miller  . Cardiac catheterization  08/01/2010  . Colonoscopy    . Shoulder arthroscopy with subacromial decompression, rotator cuff repair and bicep tendon repair Right 03/10/2014    Procedure: RIGHT SHOULDER ARTHROSCOPY WITH SUBACROMIAL DECOMPRESSION, PARTIAL ACROMIOPLASTY WITH CORACOAROMIAL LABRUM DEBRIDEMENT RELEASE DISTAL CLAVICULECTOMY,  ROTATOR CUFF REPAIR AND EXTENSIVE DEBRIDEMENT;  Surgeon: Ninetta Lights, MD;  Location: [place];  Service: Orthopedics;  Laterality: Right;  . Exam under anesthesia with manipulation of shoulder Right 09/15/2014    Procedure: RIGHT SHOULDER MANIPULATION UNDER ANESTHESIA;  Surgeon: Ninetta Lights, MD;  Location: [place];  Service: Orthopedics;  Laterality: Right;  . Shoulder  arthroscopy with rotator cuff repair and subacromial decompression Left 07/07/2015    Procedure: LEFT SHOULDER SCOPE DEBRIDEMENT, SUBACROMIAL DECOMPRESSION, DISTAL CLAVICULECTOMY, ROTATOR CUFF REPAIR  ;  Surgeon: Kathryne Hitch, MD;  Location: [place];  Service: Orthopedics;  Laterality: Left;  ANESTHESIA: GENERAL, PRE/POST OP SCALENE  . Shoulder arthroscopy with distal clavicle resection Left 07/07/2015    Procedure: SHOULDER ARTHROSCOPY WITH DISTAL CLAVICLE RESECTION;  Surgeon: Kathryne Hitch, MD;  Location: [place];  Service: Orthopedics;  Laterality: Left;  . Shoulder arthroscopy with rotator cuff repair Left 12/07/2015    Procedure: LEFT SHOULDER ARTHROSCOPY DEBRIDEMENT, WITH ROTATOR CUFF REPAIR;  Surgeon: Ninetta Lights, MD;  Location: [place];  Service: Orthopedics;  Laterality: Left;    There were no vitals filed for this visit.  Visit Diagnosis:  Left shoulder pain  Shoulder weakness  Shoulder stiffness, left      Subjective Assessment - 12/27/15 1630    Subjective reports noting increased pain a couple days ago with rainy weather rating 8/10 that day but otherwise has been around 4/10.   Currently in Pain? Yes   Pain Score 4    Pain Location Shoulder   Pain Orientation Left;Upper;Lateral           TODAY'S TREATMENT Manual - Grade 1 AP mobs L GH in 0 to 30 ABD with gentle ER  and IR PROM (ER to 28), Flexion mobs in supine today to 105 dgs; R side-lying L scap mobs, TPR and STM to L Upper Trap and into infraspinatus to help decrease guarding.               PT Short Term Goals - 12/22/15 0802    PT SHORT TERM GOAL #1   Title L shoulder PROM Flexion to 120, ER to 60 at 90 ABD, and IR to 45 at 90 ABD by 01/18/16   Status On-going           PT Long Term Goals - 12/22/15 0803    PT LONG TERM GOAL #1   Title L Shoulder AROM WFL all planes without limit by pain by 02/29/16   Status On-going   PT LONG TERM GOAL  #2   Title L Shoulder MMT 4/5 or better all planes by 02/29/16   Status On-going   PT LONG TERM GOAL #3   Title pt able to return to all ADLs, chores, and work duties without limitation by shoulder pain, LOM, or weakness by 02/29/16   Status On-going   PT LONG TERM GOAL #4   Title pt able to sleep without limitation by shoulder pain by 02/29/16   Status On-going               Plan - 12/27/15 1658    Clinical Impression Statement 3rd PT session and Flexion PROM to 105 degrees today (was 65 at initial eval).  Pain down to 4/10 on AVG lately.  Will continue with gentle POC, will be 3 weeks out from surgery tomorrow.   PT Next Visit Plan manual therapy with grade 2 mobs and gentle PROM into Flexion and IR (no restrictions per protocol), ER to 30 in 0-30 ABD until POW3; progress slowly with protocol avoid ABD stretching/ROM until approx POW6   Consulted and Agree with Plan of Care Patient        Problem List Patient Active Problem List   Diagnosis Date Noted  . Complete rotator cuff tear of left shoulder 07/05/2015  . Tennis elbow 05/30/2014  . Rotator cuff (capsule) sprain 03/10/2014  . Right shoulder pain 02/11/2014  . Memory difficulty 12/18/2012  . Gynecomastia, male 07/30/2012  . OSA (obstructive sleep apnea) 04/23/2011  . EXTERNAL HEMORRHOIDS 02/12/2011  . Prostate cancer ([place]) 11/12/2010  . PLANTAR FASCIITIS 04/09/2010  . Hypothyroidism 12/08/2009  . NEOPLASM OF UNCERTAIN BEHAVIOR OF SKIN 03/10/2009  . OSTEOARTHRITIS 02/14/2009  . HYPERLIPIDEMIA 02/08/2009  . HEARING DEFICIT 02/08/2009  . Essential hypertension 02/08/2009  . ALLERGIC RHINITIS 02/08/2009  . GERD 02/08/2009  . PROTEINURIA 02/08/2009  . DIVERTICULITIS, HX OF 02/08/2009    Keita Demarco PT, OCS 12/27/2015, 5:01 PM  [facility] [address]  [place], [place], [address] Phone: [phone]   Fax:  [phone]  Name: Zachary [name] MRN:  [id number] Date of Birth: 26-Jul-1951

## 2016-01-02 ENCOUNTER — Ambulatory Visit: Payer: 59 | Admitting: Physical Therapy

## 2016-01-02 DIAGNOSIS — R29898 Other symptoms and signs involving the musculoskeletal system: Secondary | ICD-10-CM

## 2016-01-02 DIAGNOSIS — M25512 Pain in left shoulder: Secondary | ICD-10-CM | POA: Diagnosis not present

## 2016-01-02 DIAGNOSIS — M25612 Stiffness of left shoulder, not elsewhere classified: Secondary | ICD-10-CM | POA: Diagnosis not present

## 2016-01-02 NOTE — Therapy (Signed)
[facility] [place] [address]  [place], [place], [address] Phone: [phone]   Fax:  [phone]  Physical Therapy Treatment  Patient Details  Name: Zachary [name] MRN: [id number] Date of Birth: June 22, 1951 Referring Provider: Kathryne Hitch  Encounter Date: 01/02/2016      PT End of Session - 01/02/16 1655    Visit Number 4   Number of Visits 20   Date for PT Re-Evaluation 02/29/16   PT Start Time 1623   PT Stop Time [id number]   PT Time Calculation (min) 31 min      Past Medical History  Diagnosis Date  . Hyperlipidemia   . Hypertension   . Insomnia   . Hypothyroidism   . GERD (gastroesophageal reflux disease)   . Arthritis   . Hemorrhoids   . Hearing problem     hearing deficit  . Diverticulitis   . Wears glasses   . Wears hearing aid     both ears  . Cancer ([place]) 2015    positive prostate cancer bx  . Sleep apnea     uses a cpap  . Articular cartilage disease     left shoulder    Past Surgical History  Procedure Laterality Date  . Hemorrhoid surgery  08/2006  . Back surgery  1978    herniated disksurgery  . Tendon repair  June 06, 2011    right elbow, Dr. Percell Miller  . Cardiac catheterization  08/01/2010  . Colonoscopy    . Shoulder arthroscopy with subacromial decompression, rotator cuff repair and bicep tendon repair Right 03/10/2014    Procedure: RIGHT SHOULDER ARTHROSCOPY WITH SUBACROMIAL DECOMPRESSION, PARTIAL ACROMIOPLASTY WITH CORACOAROMIAL LABRUM DEBRIDEMENT RELEASE DISTAL CLAVICULECTOMY,  ROTATOR CUFF REPAIR AND EXTENSIVE DEBRIDEMENT;  Surgeon: Ninetta Lights, MD;  Location: [facility];  Service: Orthopedics;  Laterality: Right;  . Exam under anesthesia with manipulation of shoulder Right 09/15/2014    Procedure: RIGHT SHOULDER MANIPULATION UNDER ANESTHESIA;  Surgeon: Ninetta Lights, MD;  Location: [facility];  Service: Orthopedics;  Laterality: Right;  . Shoulder  arthroscopy with rotator cuff repair and subacromial decompression Left 07/07/2015    Procedure: LEFT SHOULDER SCOPE DEBRIDEMENT, SUBACROMIAL DECOMPRESSION, DISTAL CLAVICULECTOMY, ROTATOR CUFF REPAIR  ;  Surgeon: Kathryne Hitch, MD;  Location: [facility];  Service: Orthopedics;  Laterality: Left;  ANESTHESIA: GENERAL, PRE/POST OP SCALENE  . Shoulder arthroscopy with distal clavicle resection Left 07/07/2015    Procedure: SHOULDER ARTHROSCOPY WITH DISTAL CLAVICLE RESECTION;  Surgeon: Kathryne Hitch, MD;  Location: [facility];  Service: Orthopedics;  Laterality: Left;  . Shoulder arthroscopy with rotator cuff repair Left 12/07/2015    Procedure: LEFT SHOULDER ARTHROSCOPY DEBRIDEMENT, WITH ROTATOR CUFF REPAIR;  Surgeon: Ninetta Lights, MD;  Location: [facility];  Service: Orthopedics;  Laterality: Left;    There were no vitals filed for this visit.  Visit Diagnosis:  Left shoulder pain  Shoulder weakness  Shoulder stiffness, left      Subjective Assessment - 01/02/16 1639    Currently in Pain? Yes   Pain Score 4   AVG pain 4/10 lately            [facility] PT Assessment - 01/02/16 0001    PROM   Left Shoulder Flexion 122 Degrees   Left Shoulder ABduction 65 Degrees   Left Shoulder External Rotation 34 Degrees  at 30 ABD         TODAY'S TREATMENT  Manual - R side-lying L scap mobs, TPR and STM to L Upper Trap and into infraspinatus to help decrease guarding (more guarding today). Grade 1 AP mobs L GH in 0 to 45 ABD with gentle ER and IR PROM, Flexion mobs in supine. dgs;              PT Short Term Goals - 12/22/15 0802    PT SHORT TERM GOAL #1   Title L shoulder PROM Flexion to 120, ER to 60 at 90 ABD, and IR to 45 at 90 ABD by 01/18/16   Status On-going           PT Long Term Goals - 12/22/15 0803    PT LONG TERM GOAL #1   Title L Shoulder AROM WFL all planes without limit by pain by 02/29/16   Status On-going   PT LONG  TERM GOAL #2   Title L Shoulder MMT 4/5 or better all planes by 02/29/16   Status On-going   PT LONG TERM GOAL #3   Title pt able to return to all ADLs, chores, and work duties without limitation by shoulder pain, LOM, or weakness by 02/29/16   Status On-going   PT LONG TERM GOAL #4   Title pt able to sleep without limitation by shoulder pain by 02/29/16   Status On-going               Plan - 01/02/16 1658    Clinical Impression Statement good PROM improvements continue in that Flexion to 125, ABD to 65, and ER to 38.  Still keeping treatment to PROM at this point.  Will add light AAROM into Flexion within next week.   PT Next Visit Plan manual therapy with grade 1-2 mobs and gentle PROM into Flexion, ER, and IR per protocol or per pt tolerance whichever is lighter.   Consulted and Agree with Plan of Care Patient        Problem List Patient Active Problem List   Diagnosis Date Noted  . Complete rotator cuff tear of left shoulder 07/05/2015  . Tennis elbow 05/30/2014  . Rotator cuff (capsule) sprain 03/10/2014  . Right shoulder pain 02/11/2014  . Memory difficulty 12/18/2012  . Gynecomastia, male 07/30/2012  . OSA (obstructive sleep apnea) 04/23/2011  . EXTERNAL HEMORRHOIDS 02/12/2011  . Prostate cancer ([place]) 11/12/2010  . PLANTAR FASCIITIS 04/09/2010  . Hypothyroidism 12/08/2009  . NEOPLASM OF UNCERTAIN BEHAVIOR OF SKIN 03/10/2009  . OSTEOARTHRITIS 02/14/2009  . HYPERLIPIDEMIA 02/08/2009  . HEARING DEFICIT 02/08/2009  . Essential hypertension 02/08/2009  . ALLERGIC RHINITIS 02/08/2009  . GERD 02/08/2009  . PROTEINURIA 02/08/2009  . DIVERTICULITIS, HX OF 02/08/2009    Matti Killingsworth PT, OCS 01/02/2016, 5:00 PM  [facility] [address]  [place], [place], [address] Phone: [phone]   Fax:  [phone]  Name: Zachary [name] MRN: [id number] Date of Birth: [birth year]

## 2016-01-04 ENCOUNTER — Ambulatory Visit: Payer: 59 | Admitting: Physical Therapy

## 2016-01-04 DIAGNOSIS — M25512 Pain in left shoulder: Secondary | ICD-10-CM | POA: Diagnosis not present

## 2016-01-04 DIAGNOSIS — R29898 Other symptoms and signs involving the musculoskeletal system: Secondary | ICD-10-CM

## 2016-01-04 DIAGNOSIS — M25612 Stiffness of left shoulder, not elsewhere classified: Secondary | ICD-10-CM

## 2016-01-04 NOTE — Therapy (Signed)
[facility] [place] [facility] [address]  [place], [place], [address] Phone: [phone]   Fax:  [phone]  Physical Therapy Treatment  Patient Details  Name: Zachary [name] MRN: [id number] Date of Birth: Jun 11, 1951 Referring Provider: Kathryne Hitch  Encounter Date: 01/04/2016      PT End of Session - 01/04/16 1706    Visit Number 5   Number of Visits 20   Date for PT Re-Evaluation 02/29/16   PT Start Time [id number]   PT Stop Time 1735   PT Time Calculation (min) 31 min      Past Medical History  Diagnosis Date  . Hyperlipidemia   . Hypertension   . Insomnia   . Hypothyroidism   . GERD (gastroesophageal reflux disease)   . Arthritis   . Hemorrhoids   . Hearing problem     hearing deficit  . Diverticulitis   . Wears glasses   . Wears hearing aid     both ears  . Cancer ([place]) 2015    positive prostate cancer bx  . Sleep apnea     uses a cpap  . Articular cartilage disease     left shoulder    Past Surgical History  Procedure Laterality Date  . Hemorrhoid surgery  08/2006  . Back surgery  1978    herniated disksurgery  . Tendon repair  June 06, 2011    right elbow, Dr. Percell Miller  . Cardiac catheterization  08/01/2010  . Colonoscopy    . Shoulder arthroscopy with subacromial decompression, rotator cuff repair and bicep tendon repair Right 03/10/2014    Procedure: RIGHT SHOULDER ARTHROSCOPY WITH SUBACROMIAL DECOMPRESSION, PARTIAL ACROMIOPLASTY WITH CORACOAROMIAL LABRUM DEBRIDEMENT RELEASE DISTAL CLAVICULECTOMY,  ROTATOR CUFF REPAIR AND EXTENSIVE DEBRIDEMENT;  Surgeon: Ninetta Lights, MD;  Location: [facility];  Service: Orthopedics;  Laterality: Right;  . Exam under anesthesia with manipulation of shoulder Right 09/15/2014    Procedure: RIGHT SHOULDER MANIPULATION UNDER ANESTHESIA;  Surgeon: Ninetta Lights, MD;  Location: [facility];  Service: Orthopedics;  Laterality: Right;  . Shoulder  arthroscopy with rotator cuff repair and subacromial decompression Left 07/07/2015    Procedure: LEFT SHOULDER SCOPE DEBRIDEMENT, SUBACROMIAL DECOMPRESSION, DISTAL CLAVICULECTOMY, ROTATOR CUFF REPAIR  ;  Surgeon: Kathryne Hitch, MD;  Location: [facility];  Service: Orthopedics;  Laterality: Left;  ANESTHESIA: GENERAL, PRE/POST OP SCALENE  . Shoulder arthroscopy with distal clavicle resection Left 07/07/2015    Procedure: SHOULDER ARTHROSCOPY WITH DISTAL CLAVICLE RESECTION;  Surgeon: Kathryne Hitch, MD;  Location: [facility];  Service: Orthopedics;  Laterality: Left;  . Shoulder arthroscopy with rotator cuff repair Left 12/07/2015    Procedure: LEFT SHOULDER ARTHROSCOPY DEBRIDEMENT, WITH ROTATOR CUFF REPAIR;  Surgeon: Ninetta Lights, MD;  Location: [facility];  Service: Orthopedics;  Laterality: Left;    There were no vitals filed for this visit.  Visit Diagnosis:  Left shoulder pain  Shoulder weakness  Shoulder stiffness, left      Subjective Assessment - 01/04/16 1707    Subjective States pain remains same 4/10 range; sleeping well unless rolls onto R side.  States has not rolled on R side past 3 nights so has slept without issue related to shoulder pain.   Currently in Pain? Yes   Pain Score 4    Pain Location Shoulder   Pain Orientation Left;Upper;Lateral             TODAY'S TREATMENT Manual -  R side-lying L scap mobs, TPR and STM to L Upper Trap and into infraspinatus to help decrease guarding (more guarding today). Grade 1 AP mobs L GH in 0 to 60 ABD with gentle ER and IR PROM, Flexion mobs in supine.  TherEx -  AAROM R shoulder flexion supine Pulleys Flexion 15x Pendulums 1' Standing back to wall scapular retraction isometric 10x5"           PT Education - 01/04/16 1745    Education provided Yes   Education Details HEP - pulleys into flexion, pendulum, scap retraction isometric   Person(s) Educated Patient   Methods  Explanation;Demonstration   Comprehension Verbalized understanding;Returned demonstration          PT Short Term Goals - 12/22/15 0802    PT SHORT TERM GOAL #1   Title L shoulder PROM Flexion to 120, ER to 60 at 90 ABD, and IR to 45 at 90 ABD by 01/18/16   Status On-going           PT Long Term Goals - 12/22/15 0803    PT LONG TERM GOAL #1   Title L Shoulder AROM WFL all planes without limit by pain by 02/29/16   Status On-going   PT LONG TERM GOAL #2   Title L Shoulder MMT 4/5 or better all planes by 02/29/16   Status On-going   PT LONG TERM GOAL #3   Title pt able to return to all ADLs, chores, and work duties without limitation by shoulder pain, LOM, or weakness by 02/29/16   Status On-going   PT LONG TERM GOAL #4   Title pt able to sleep without limitation by shoulder pain by 02/29/16   Status On-going               Plan - 01/04/16 1742    Clinical Impression Statement added pulleys for flexion AAROM, pendulums, and scapular retraction isometrics to POC and HEP.  Well tolerated today.  Did not measure ROM today, but ER was at least 45 degrees today.  Pt is progressing quite well with ROM to date but we are still being very conservative in his progressions at this point.  He is 4 weeks out from surgery today so will feel more comfortable with progressions in 2 more weeks.   PT Next Visit Plan manual therapy with grade 1-2 mobs and gentle PROM into Flexion, ER, and IR per protocol or per pt tolerance whichever is lighter.   Consulted and Agree with Plan of Care Patient        Problem List Patient Active Problem List   Diagnosis Date Noted  . Complete rotator cuff tear of left shoulder 07/05/2015  . Tennis elbow 05/30/2014  . Rotator cuff (capsule) sprain 03/10/2014  . Right shoulder pain 02/11/2014  . Memory difficulty 12/18/2012  . Gynecomastia, male 07/30/2012  . OSA (obstructive sleep apnea) 04/23/2011  . EXTERNAL HEMORRHOIDS 02/12/2011  . Prostate cancer ([place])  11/12/2010  . PLANTAR FASCIITIS 04/09/2010  . Hypothyroidism 12/08/2009  . NEOPLASM OF UNCERTAIN BEHAVIOR OF SKIN 03/10/2009  . OSTEOARTHRITIS 02/14/2009  . HYPERLIPIDEMIA 02/08/2009  . HEARING DEFICIT 02/08/2009  . Essential hypertension 02/08/2009  . ALLERGIC RHINITIS 02/08/2009  . GERD 02/08/2009  . PROTEINURIA 02/08/2009  . DIVERTICULITIS, HX OF 02/08/2009    Jeriah Skufca PT, OCS 01/04/2016, 5:45 PM  [facility] [address]  [place], [place], [address] Phone: [phone]   Fax:  [phone]  Name:  HAGER ZIMA MRN: [id number] Date of Birth: [birth year]

## 2016-01-08 ENCOUNTER — Ambulatory Visit: Payer: 59 | Admitting: Physical Therapy

## 2016-01-08 DIAGNOSIS — M25512 Pain in left shoulder: Secondary | ICD-10-CM | POA: Diagnosis not present

## 2016-01-08 DIAGNOSIS — R29898 Other symptoms and signs involving the musculoskeletal system: Secondary | ICD-10-CM | POA: Diagnosis not present

## 2016-01-08 DIAGNOSIS — M25612 Stiffness of left shoulder, not elsewhere classified: Secondary | ICD-10-CM | POA: Diagnosis not present

## 2016-01-08 NOTE — Therapy (Signed)
[facility] [address]  [address] [place], [place], [address] Phone: [phone]   Fax:  [phone]  Physical Therapy Treatment  Patient Details  Name: Zachary [name] MRN: [id number] Date of Birth: 04/17/1951 Referring Provider: Daniel Murphy  Encounter Date: 01/08/2016      PT End of Session - 01/08/16 0720    Visit Number 6   Number of Visits 20   Date for PT Re-Evaluation 02/29/16   PT Start Time 0718   PT Stop Time 0759   PT Time Calculation (min) 41 min      Past Medical History  Diagnosis Date  . Hyperlipidemia   . Hypertension   . Insomnia   . Hypothyroidism   . GERD (gastroesophageal reflux disease)   . Arthritis   . Hemorrhoids   . Hearing problem     hearing deficit  . Diverticulitis   . Wears glasses   . Wears hearing aid     both ears  . Cancer ([facility]) 2015    positive prostate cancer bx  . Sleep apnea     uses a cpap  . Articular cartilage disease     left shoulder    Past Surgical History  Procedure Laterality Date  . Hemorrhoid surgery  08/2006  . Back surgery  1978    herniated disksurgery  . Tendon repair  June 06, 2011    right elbow, Dr. Murphy  . Cardiac catheterization  08/01/2010  . Colonoscopy    . Shoulder arthroscopy with subacromial decompression, rotator cuff repair and bicep tendon repair Right 03/10/2014    Procedure: RIGHT SHOULDER ARTHROSCOPY WITH SUBACROMIAL DECOMPRESSION, PARTIAL ACROMIOPLASTY WITH CORACOAROMIAL LABRUM DEBRIDEMENT RELEASE DISTAL CLAVICULECTOMY,  ROTATOR CUFF REPAIR AND EXTENSIVE DEBRIDEMENT;  Surgeon: Daniel F Murphy, MD;  Location: [facility];  Service: Orthopedics;  Laterality: Right;  . Exam under anesthesia with manipulation of shoulder Right 09/15/2014    Procedure: RIGHT SHOULDER MANIPULATION UNDER ANESTHESIA;  Surgeon: Daniel F Murphy, MD;  Location: [facility];  Service: Orthopedics;  Laterality: Right;  . Shoulder  arthroscopy with rotator cuff repair and subacromial decompression Left 07/07/2015    Procedure: LEFT SHOULDER SCOPE DEBRIDEMENT, SUBACROMIAL DECOMPRESSION, DISTAL CLAVICULECTOMY, ROTATOR CUFF REPAIR  ;  Surgeon: Daniel Murphy, MD;  Location: [facility];  Service: Orthopedics;  Laterality: Left;  ANESTHESIA: GENERAL, PRE/POST OP SCALENE  . Shoulder arthroscopy with distal clavicle resection Left 07/07/2015    Procedure: SHOULDER ARTHROSCOPY WITH DISTAL CLAVICLE RESECTION;  Surgeon: Daniel Murphy, MD;  Location: [facility];  Service: Orthopedics;  Laterality: Left;  . Shoulder arthroscopy with rotator cuff repair Left 12/07/2015    Procedure: LEFT SHOULDER ARTHROSCOPY DEBRIDEMENT, WITH ROTATOR CUFF REPAIR;  Surgeon: Daniel F Murphy, MD;  Location: [facility];  Service: Orthopedics;  Laterality: Left;    There were no vitals filed for this visit.  Visit Diagnosis:  Left shoulder pain  Shoulder weakness  Shoulder stiffness, left      Subjective Assessment - 01/08/16 0720    Subjective States has been performing HEP without noting lasting increased pain.  States pain continues to run 4/10 on AVG.   Currently in Pain? Yes   Pain Score 4    Pain Location Shoulder   Pain Orientation Left;Lateral;Upper            [facility] PT Assessment - 01/08/16 0001    PROM   Left Shoulder Flexion 130 Degrees     Left Shoulder ABduction 80 Degrees   Left Shoulder External Rotation 40 Degrees      TODAY'S TREATMENT Manual - R side-lying L scap mobs, TPR and STM to L Upper Trap and into infraspinatus to help decrease guarding. Grade 1 AP mobs L GH in 0 to 80 ABD with gentle ER and IR PROM, Flexion mobs in supine.  TherEx -  AAROM L shoulder ER in R Side-Lying Pulleys Flexion 20x Standing L Shoulder Ext and IR isometrics into pillow 10x5" each Standing back to wall scapular retraction isometric 10x5" Standing B Shoulder EXT with Scap Retraction Yellow TB  10x5"                          PT Short Term Goals - 01/08/16 0802    PT SHORT TERM GOAL #1   Title L shoulder PROM Flexion to 120, ER to 60 at 90 ABD, and IR to 45 at 90 ABD by 01/18/16  Flexion goal met; ER to 40 at 45 ABD   Status Partially Met           PT Long Term Goals - 01/08/16 0802    PT LONG TERM GOAL #1   Title L Shoulder AROM WFL all planes without limit by pain by 02/29/16   PT LONG TERM GOAL #2   Title L Shoulder MMT 4/5 or better all planes by 02/29/16   Status On-going   PT LONG TERM GOAL #3   Title pt able to return to all ADLs, chores, and work duties without limitation by shoulder pain, LOM, or weakness by 02/29/16   Status On-going   PT LONG TERM GOAL #4   Title pt able to sleep without limitation by shoulder pain by 02/29/16  only notes difficulty if lies on L side   Status Partially Met               Plan - 01/08/16 0751    Clinical Impression Statement Performing current HEP without lasting increased pain, progressing well in clinic.  PROM best to date but progress here seems to be slowing. Pt has MD appointment next week.   PT Next Visit Plan manual therapy with grade 1-2 mobs and gentle PROM into Flexion, ER, and IR per protocol or per pt tolerance whichever is lighter.   Consulted and Agree with Plan of Care Patient        Problem List Patient Active Problem List   Diagnosis Date Noted  . Complete rotator cuff tear of left shoulder 07/05/2015  . Tennis elbow 05/30/2014  . Rotator cuff (capsule) sprain 03/10/2014  . Right shoulder pain 02/11/2014  . Memory difficulty 12/18/2012  . Gynecomastia, male 07/30/2012  . OSA (obstructive sleep apnea) 04/23/2011  . EXTERNAL HEMORRHOIDS 02/12/2011  . Prostate cancer ([facility]) 11/12/2010  . PLANTAR FASCIITIS 04/09/2010  . Hypothyroidism 12/08/2009  . NEOPLASM OF UNCERTAIN BEHAVIOR OF SKIN 03/10/2009  . OSTEOARTHRITIS 02/14/2009  . HYPERLIPIDEMIA 02/08/2009  . HEARING DEFICIT  02/08/2009  . Essential hypertension 02/08/2009  . ALLERGIC RHINITIS 02/08/2009  . GERD 02/08/2009  . PROTEINURIA 02/08/2009  . DIVERTICULITIS, HX OF 02/08/2009    , PT, OCS 01/08/2016, 8:03 AM  [facility] [address]  [address] [place], [place], [address] Phone: [phone]   Fax:  [phone]  Name: Zachary [name] MRN: [id number] Date of Birth: 01/13/1951    

## 2016-01-10 ENCOUNTER — Ambulatory Visit: Payer: 59 | Admitting: Physical Therapy

## 2016-01-10 DIAGNOSIS — M25612 Stiffness of left shoulder, not elsewhere classified: Secondary | ICD-10-CM | POA: Diagnosis not present

## 2016-01-10 DIAGNOSIS — R29898 Other symptoms and signs involving the musculoskeletal system: Secondary | ICD-10-CM

## 2016-01-10 DIAGNOSIS — M25512 Pain in left shoulder: Secondary | ICD-10-CM

## 2016-01-10 NOTE — Therapy (Signed)
[facility] [address]  [place], [place], [address] Phone: [phone]   Fax:  [phone]  Physical Therapy Treatment  Patient Details  Name: Zachary [name] MRN: [id number] Date of Birth: 1951/06/07 Referring Provider: Kathryne Hitch  Encounter Date: 01/10/2016      PT End of Session - 01/10/16 0720    Visit Number 7   Number of Visits 20   Date for PT Re-Evaluation 02/29/16   PT Start Time 0718   PT Stop Time 0756   PT Time Calculation (min) 38 min      Past Medical History  Diagnosis Date  . Hyperlipidemia   . Hypertension   . Insomnia   . Hypothyroidism   . GERD (gastroesophageal reflux disease)   . Arthritis   . Hemorrhoids   . Hearing problem     hearing deficit  . Diverticulitis   . Wears glasses   . Wears hearing aid     both ears  . Cancer ([place]) 2015    positive prostate cancer bx  . Sleep apnea     uses a cpap  . Articular cartilage disease     left shoulder    Past Surgical History  Procedure Laterality Date  . Hemorrhoid surgery  08/2006  . Back surgery  1978    herniated disksurgery  . Tendon repair  June 06, 2011    right elbow, Dr. Percell Miller  . Cardiac catheterization  08/01/2010  . Colonoscopy    . Shoulder arthroscopy with subacromial decompression, rotator cuff repair and bicep tendon repair Right 03/10/2014    Procedure: RIGHT SHOULDER ARTHROSCOPY WITH SUBACROMIAL DECOMPRESSION, PARTIAL ACROMIOPLASTY WITH CORACOAROMIAL LABRUM DEBRIDEMENT RELEASE DISTAL CLAVICULECTOMY,  ROTATOR CUFF REPAIR AND EXTENSIVE DEBRIDEMENT;  Surgeon: Ninetta Lights, MD;  Location: [facility];  Service: Orthopedics;  Laterality: Right;  . Exam under anesthesia with manipulation of shoulder Right 09/15/2014    Procedure: RIGHT SHOULDER MANIPULATION UNDER ANESTHESIA;  Surgeon: Ninetta Lights, MD;  Location: [facility];  Service: Orthopedics;  Laterality: Right;  . Shoulder  arthroscopy with rotator cuff repair and subacromial decompression Left 07/07/2015    Procedure: LEFT SHOULDER SCOPE DEBRIDEMENT, SUBACROMIAL DECOMPRESSION, DISTAL CLAVICULECTOMY, ROTATOR CUFF REPAIR  ;  Surgeon: Kathryne Hitch, MD;  Location: [facility];  Service: Orthopedics;  Laterality: Left;  ANESTHESIA: GENERAL, PRE/POST OP SCALENE  . Shoulder arthroscopy with distal clavicle resection Left 07/07/2015    Procedure: SHOULDER ARTHROSCOPY WITH DISTAL CLAVICLE RESECTION;  Surgeon: Kathryne Hitch, MD;  Location: [facility];  Service: Orthopedics;  Laterality: Left;  . Shoulder arthroscopy with rotator cuff repair Left 12/07/2015    Procedure: LEFT SHOULDER ARTHROSCOPY DEBRIDEMENT, WITH ROTATOR CUFF REPAIR;  Surgeon: Ninetta Lights, MD;  Location: [facility];  Service: Orthopedics;  Laterality: Left;    There were no vitals filed for this visit.  Visit Diagnosis:  Left shoulder pain  Shoulder weakness  Shoulder stiffness, left      Subjective Assessment - 01/10/16 0718    Subjective States woke last night around 10:30 due to increasing L shoulder pain.  States had difficulty sleeping for the rest of the night following this.  States felt better upon waking this AM and has since taken pain meds and pain currently 6/10.   Currently in Pain? Yes   Pain Score 6    Pain Location Shoulder   Pain Orientation Left;Lateral;Upper  TODAY'S TREATMENT Manual - Grade 1 and 2 AP mobs L GH in 0 to 80 ABD with gentle ER and IR PROM; Grade 2 Flexion mobs in supine.  TherEx -  AROM L shoulder ER in R Side-Lying 2x10 Supine Wand Flexion AAROM 15x Standing B Shoulder EXT with Scap Retraction Yellow TB 10x5" Pulleys Flexion 15x, Horiz ABD/ADD 15x Standing L Shoulder IR Yellow TB 15x                            PT Short Term Goals - 01/08/16 0802    PT SHORT TERM GOAL #1   Title L shoulder PROM Flexion to 120, ER to 60 at 90  ABD, and IR to 45 at 90 ABD by 01/18/16  Flexion goal met; ER to 40 at 45 ABD   Status Partially Met           PT Long Term Goals - 01/08/16 0802    PT LONG TERM GOAL #1   Title L Shoulder AROM WFL all planes without limit by pain by 02/29/16   PT LONG TERM GOAL #2   Title L Shoulder MMT 4/5 or better all planes by 02/29/16   Status On-going   PT LONG TERM GOAL #3   Title pt able to return to all ADLs, chores, and work duties without limitation by shoulder pain, LOM, or weakness by 02/29/16   Status On-going   PT LONG TERM GOAL #4   Title pt able to sleep without limitation by shoulder pain by 02/29/16  only notes difficulty if lies on L side   Status Partially Met               Plan - 01/10/16 0720    Clinical Impression Statement pt with increased pain last night while in bed for unknown reason.  States he thinks he may have rolled onto L side.  Pain has improved since last night but he states pain still up to 6/10 at this time.  Performed well with today's treatment without c/o increased pain with manual or exercise.   PT Next Visit Plan manual therapy with grade 1-2 mobs and gentle PROM into Flexion, ER, and IR per protocol or per pt tolerance whichever is lighter.   Consulted and Agree with Plan of Care Patient        Problem List Patient Active Problem List   Diagnosis Date Noted  . Complete rotator cuff tear of left shoulder 07/05/2015  . Tennis elbow 05/30/2014  . Rotator cuff (capsule) sprain 03/10/2014  . Right shoulder pain 02/11/2014  . Memory difficulty 12/18/2012  . Gynecomastia, male 07/30/2012  . OSA (obstructive sleep apnea) 04/23/2011  . EXTERNAL HEMORRHOIDS 02/12/2011  . Prostate cancer ([place]) 11/12/2010  . PLANTAR FASCIITIS 04/09/2010  . Hypothyroidism 12/08/2009  . NEOPLASM OF UNCERTAIN BEHAVIOR OF SKIN 03/10/2009  . OSTEOARTHRITIS 02/14/2009  . HYPERLIPIDEMIA 02/08/2009  . HEARING DEFICIT 02/08/2009  . Essential hypertension 02/08/2009  .  ALLERGIC RHINITIS 02/08/2009  . GERD 02/08/2009  . PROTEINURIA 02/08/2009  . DIVERTICULITIS, HX OF 02/08/2009    Izabelle Daus PT, OCS 01/10/2016, 7:57 AM  [facility] [place], [place], [address] Phone: [phone]   Fax:  [phone]  Name: Zachary [name] MRN: [id number] Date of Birth: 06-Mar-1951

## 2016-01-15 ENCOUNTER — Ambulatory Visit: Payer: 59 | Admitting: Physical Therapy

## 2016-01-15 DIAGNOSIS — M25512 Pain in left shoulder: Secondary | ICD-10-CM | POA: Diagnosis not present

## 2016-01-15 DIAGNOSIS — R29898 Other symptoms and signs involving the musculoskeletal system: Secondary | ICD-10-CM | POA: Diagnosis not present

## 2016-01-15 DIAGNOSIS — M25612 Stiffness of left shoulder, not elsewhere classified: Secondary | ICD-10-CM

## 2016-01-15 NOTE — Therapy (Signed)
[facility] [address]  [place], [place], [address] Phone: [phone]   Fax:  [phone]  Physical Therapy Treatment  Patient Details  Name: Zachary [name] MRN: [id number] Date of Birth: 1951-08-19 Referring Provider: Kathryne Hitch  Encounter Date: 01/15/2016      PT End of Session - 01/15/16 0720    Visit Number 8   Number of Visits 20   Date for PT Re-Evaluation 02/29/16   PT Start Time 0718   PT Stop Time 0755   PT Time Calculation (min) 37 min      Past Medical History  Diagnosis Date  . Hyperlipidemia   . Hypertension   . Insomnia   . Hypothyroidism   . GERD (gastroesophageal reflux disease)   . Arthritis   . Hemorrhoids   . Hearing problem     hearing deficit  . Diverticulitis   . Wears glasses   . Wears hearing aid     both ears  . Cancer ([place]) 2015    positive prostate cancer bx  . Sleep apnea     uses a cpap  . Articular cartilage disease     left shoulder    Past Surgical History  Procedure Laterality Date  . Hemorrhoid surgery  08/2006  . Back surgery  1978    herniated disksurgery  . Tendon repair  June 06, 2011    right elbow, Dr. Percell Miller  . Cardiac catheterization  08/01/2010  . Colonoscopy    . Shoulder arthroscopy with subacromial decompression, rotator cuff repair and bicep tendon repair Right 03/10/2014    Procedure: RIGHT SHOULDER ARTHROSCOPY WITH SUBACROMIAL DECOMPRESSION, PARTIAL ACROMIOPLASTY WITH CORACOAROMIAL LABRUM DEBRIDEMENT RELEASE DISTAL CLAVICULECTOMY,  ROTATOR CUFF REPAIR AND EXTENSIVE DEBRIDEMENT;  Surgeon: Ninetta Lights, MD;  Location: [facility];  Service: Orthopedics;  Laterality: Right;  . Exam under anesthesia with manipulation of shoulder Right 09/15/2014    Procedure: RIGHT SHOULDER MANIPULATION UNDER ANESTHESIA;  Surgeon: Ninetta Lights, MD;  Location: [facility];  Service: Orthopedics;  Laterality: Right;  . Shoulder  arthroscopy with rotator cuff repair and subacromial decompression Left 07/07/2015    Procedure: LEFT SHOULDER SCOPE DEBRIDEMENT, SUBACROMIAL DECOMPRESSION, DISTAL CLAVICULECTOMY, ROTATOR CUFF REPAIR  ;  Surgeon: Kathryne Hitch, MD;  Location: [facility];  Service: Orthopedics;  Laterality: Left;  ANESTHESIA: GENERAL, PRE/POST OP SCALENE  . Shoulder arthroscopy with distal clavicle resection Left 07/07/2015    Procedure: SHOULDER ARTHROSCOPY WITH DISTAL CLAVICLE RESECTION;  Surgeon: Kathryne Hitch, MD;  Location: [facility];  Service: Orthopedics;  Laterality: Left;  . Shoulder arthroscopy with rotator cuff repair Left 12/07/2015    Procedure: LEFT SHOULDER ARTHROSCOPY DEBRIDEMENT, WITH ROTATOR CUFF REPAIR;  Surgeon: Ninetta Lights, MD;  Location: [facility];  Service: Orthopedics;  Laterality: Left;    There were no vitals filed for this visit.  Visit Diagnosis:  Left shoulder pain  Shoulder weakness  Shoulder stiffness, left      Subjective Assessment - 01/15/16 0720    Subjective states pain has been 4/10 on AVG latetly.  Sleeping: good nights and bad nights.  Noted pocket of fluid in L elbow since Friday.  No pain noted in elbow.   Currently in Pain? Yes   Pain Score 4    Pain Location Shoulder   Pain Orientation Left;Lateral;Upper            [facility] PT Assessment - 01/15/16 0001  PROM   Left Shoulder Flexion 130 Degrees   Left Shoulder ABduction 88 Degrees   Left Shoulder Internal Rotation 60 Degrees  at 60 ABD   Left Shoulder External Rotation 45 Degrees  AT 60 ABD        TODAY'S TREATMENT Manual - Grade 1 and 2 AP mobs L GH in 0 to 85 ABD with gentle ER and IR PROM; Grade 2 Flexion mobs in supine.  TherEx - Supine Wand Shoulder Flexion AAROM 15x Supine Wand R Shoulder ER self stretch 10x5" AROM L shoulder ER in R Side-Lying 15x Pulleys Flexion 20x, Horiz ABD/ADD 15x Standing L Shoulder IR Yellow TB 20x Standing L  Shoulder ER Yellow TB 15x (burning) Standing B Shoulder EXT with Scap Retraction Yellow TB 10x5"             PT Education - 01/15/16 0830    Education provided Yes   Education Details HEP Update   Person(s) Educated Patient   Methods Explanation;Demonstration;Handout   Comprehension Returned demonstration;Verbalized understanding          PT Short Term Goals - 01/15/16 0753    PT SHORT TERM GOAL #1   Title L shoulder PROM Flexion to 120, ER to 60 at 90 ABD, and IR to 45 at 90 ABD by 01/18/16  Met into Flexion improving into ER and IR   Status Partially Met           PT Long Term Goals - 01/15/16 0753    PT LONG TERM GOAL #1   Title L Shoulder AROM WFL all planes without limit by pain by 02/29/16   Status On-going   PT LONG TERM GOAL #2   Title L Shoulder MMT 4/5 or better all planes by 02/29/16   Status On-going   PT LONG TERM GOAL #3   Title pt able to return to all ADLs, chores, and work duties without limitation by shoulder pain, LOM, or weakness by 02/29/16   Status On-going   PT LONG TERM GOAL #4   Title pt able to sleep without limitation by shoulder pain by 02/29/16  some good nights, unable to lie on L side   Status Partially Met               Plan - 01/15/16 0825    Clinical Impression Statement Pt is just over 5 weeks out from L RC repair. He is progressing well with regard to L shoulder PROM without increasing pain.  His treatments have been focusing on PROM and  AAROM to date in order to limit strain to [facility].   PT Next Visit Plan manual therapy with grade 1-2 mobs and gentle PROM into Flexion, ER, and IR per protocol or per pt tolerance whichever is lighter.   Consulted and Agree with Plan of Care Patient        Problem List Patient Active Problem List   Diagnosis Date Noted  . Complete rotator cuff tear of left shoulder 07/05/2015  . Tennis elbow 05/30/2014  . Rotator cuff (capsule) sprain 03/10/2014  . Right shoulder pain 02/11/2014  .  Memory difficulty 12/18/2012  . Gynecomastia, male 07/30/2012  . OSA (obstructive sleep apnea) 04/23/2011  . EXTERNAL HEMORRHOIDS 02/12/2011  . Prostate cancer ([place]) 11/12/2010  . PLANTAR FASCIITIS 04/09/2010  . Hypothyroidism 12/08/2009  . NEOPLASM OF UNCERTAIN BEHAVIOR OF SKIN 03/10/2009  . OSTEOARTHRITIS 02/14/2009  . HYPERLIPIDEMIA 02/08/2009  . HEARING DEFICIT 02/08/2009  . Essential hypertension 02/08/2009  . ALLERGIC RHINITIS 02/08/2009  .  GERD 02/08/2009  . PROTEINURIA 02/08/2009  . DIVERTICULITIS, HX OF 02/08/2009    Tonita Bills PT, OCS 01/15/2016, 8:49 AM  [facility] [place], [place], [address] Phone: [phone]   Fax:  [phone]  Name: Zachary [name] MRN: [id number] Date of Birth: 01-Jun-1951

## 2016-01-16 DIAGNOSIS — M25522 Pain in left elbow: Secondary | ICD-10-CM | POA: Diagnosis not present

## 2016-01-16 DIAGNOSIS — S46012D Strain of muscle(s) and tendon(s) of the rotator cuff of left shoulder, subsequent encounter: Secondary | ICD-10-CM | POA: Diagnosis not present

## 2016-01-17 ENCOUNTER — Ambulatory Visit: Payer: 59 | Admitting: Physical Therapy

## 2016-01-17 DIAGNOSIS — M25612 Stiffness of left shoulder, not elsewhere classified: Secondary | ICD-10-CM

## 2016-01-17 DIAGNOSIS — M25512 Pain in left shoulder: Secondary | ICD-10-CM

## 2016-01-17 DIAGNOSIS — R29898 Other symptoms and signs involving the musculoskeletal system: Secondary | ICD-10-CM | POA: Diagnosis not present

## 2016-01-17 NOTE — Therapy (Signed)
[facility] [address]  [place], [place], [address] Phone: [phone]   Fax:  [phone]  Physical Therapy Treatment  Patient Details  Name: Zachary [name] MRN: [id number] Date of Birth: [birth year] Referring Provider: Kathryne Hitch  Encounter Date: 01/17/2016      PT End of Session - 01/17/16 0721    Visit Number 9   Number of Visits 20   Date for PT Re-Evaluation 02/29/16   PT Start Time 0718   PT Stop Time 0756   PT Time Calculation (min) 38 min      Past Medical History  Diagnosis Date  . Hyperlipidemia   . Hypertension   . Insomnia   . Hypothyroidism   . GERD (gastroesophageal reflux disease)   . Arthritis   . Hemorrhoids   . Hearing problem     hearing deficit  . Diverticulitis   . Wears glasses   . Wears hearing aid     both ears  . Cancer ([place]) 2015    positive prostate cancer bx  . Sleep apnea     uses a cpap  . Articular cartilage disease     left shoulder    Past Surgical History  Procedure Laterality Date  . Hemorrhoid surgery  08/2006  . Back surgery  1978    herniated disksurgery  . Tendon repair  June 06, 2011    right elbow, Dr. Percell Miller  . Cardiac catheterization  08/01/2010  . Colonoscopy    . Shoulder arthroscopy with subacromial decompression, rotator cuff repair and bicep tendon repair Right 03/10/2014    Procedure: RIGHT SHOULDER ARTHROSCOPY WITH SUBACROMIAL DECOMPRESSION, PARTIAL ACROMIOPLASTY WITH CORACOAROMIAL LABRUM DEBRIDEMENT RELEASE DISTAL CLAVICULECTOMY,  ROTATOR CUFF REPAIR AND EXTENSIVE DEBRIDEMENT;  Surgeon: Ninetta Lights, MD;  Location: [place];  Service: Orthopedics;  Laterality: Right;  . Exam under anesthesia with manipulation of shoulder Right 09/15/2014    Procedure: RIGHT SHOULDER MANIPULATION UNDER ANESTHESIA;  Surgeon: Ninetta Lights, MD;  Location: [facility];  Service: Orthopedics;  Laterality: Right;  . Shoulder  arthroscopy with rotator cuff repair and subacromial decompression Left 07/07/2015    Procedure: LEFT SHOULDER SCOPE DEBRIDEMENT, SUBACROMIAL DECOMPRESSION, DISTAL CLAVICULECTOMY, ROTATOR CUFF REPAIR  ;  Surgeon: Kathryne Hitch, MD;  Location: [facility];  Service: Orthopedics;  Laterality: Left;  ANESTHESIA: GENERAL, PRE/POST OP SCALENE  . Shoulder arthroscopy with distal clavicle resection Left 07/07/2015    Procedure: SHOULDER ARTHROSCOPY WITH DISTAL CLAVICLE RESECTION;  Surgeon: Kathryne Hitch, MD;  Location: [facility];  Service: Orthopedics;  Laterality: Left;  . Shoulder arthroscopy with rotator cuff repair Left 12/07/2015    Procedure: LEFT SHOULDER ARTHROSCOPY DEBRIDEMENT, WITH ROTATOR CUFF REPAIR;  Surgeon: Ninetta Lights, MD;  Location: [facility];  Service: Orthopedics;  Laterality: Left;    There were no vitals filed for this visit.  Visit Diagnosis:  Left shoulder pain  Shoulder weakness  Shoulder stiffness, left      Subjective Assessment - 01/17/16 0719    Subjective Saw MD yesterday and overall MD pleased.  Has discharged sling wearing and allowing driving.  He is to [employer] next week light duty but is to wear his sling while at work and only allowed to participate in [place].  Not wearing sling today and c/o increased soreness due to this rating 6/10 today.  Had difficulty sleeping due to pain.   Currently in Pain? Yes   Pain  Score 6    Pain Location Shoulder   Pain Orientation Left          TODAY'S TREATMENT Manual - Grade 1 and 2 AP mobs L GH in 0 to 85 ABD with gentle ER and IR PROM; Grade 2 Flexion mobs in supine.  TherEx - Supine Wand Shoulder Flexion AAROM 15x Supine Wand R Shoulder ER self stretch 10x5" AROM L shoulder ER in R Side-Lying 15x Corner stretch 3x10-15" (light intensity) Standing B Shoulder EXT with Scap Retraction Red TB 10x5" Standing L Shoulder IR Yellow TB 20x Prone B Scap retraction 8x5"  (requires TC and VC with every rep to perform correctly) Standing L Shoulder ER Yellow TB 15x (burning) Pulleys Flexion 15x, Scaption 15x            PT Short Term Goals - 01/15/16 0753    PT SHORT TERM GOAL #1   Title L shoulder PROM Flexion to 120, ER to 60 at 90 ABD, and IR to 45 at 90 ABD by 01/18/16  Met into Flexion improving into ER and IR   Status Partially Met           PT Long Term Goals - 01/15/16 0753    PT LONG TERM GOAL #1   Title L Shoulder AROM WFL all planes without limit by pain by 02/29/16   Status On-going   PT LONG TERM GOAL #2   Title L Shoulder MMT 4/5 or better all planes by 02/29/16   Status On-going   PT LONG TERM GOAL #3   Title pt able to return to all ADLs, chores, and work duties without limitation by shoulder pain, LOM, or weakness by 02/29/16   Status On-going   PT LONG TERM GOAL #4   Title pt able to sleep without limitation by shoulder pain by 02/29/16  some good nights, unable to lie on L side   Status Partially Met               Plan - 01/17/16 0752    Clinical Impression Statement MD has released pt to RTW light duty with use of sling but does not have to wear sling otherwise.  Pt with increased soreness today and difficulty sleeping last night due to soreness believing due to not wearing sling.  Performed very well today with regard to tolerance/pain but requires heavy VC and/or TC to maintain proper scapular mechanics with many exercises.   PT Next Visit Plan progress to standing AAROM exercises next treatment; continue to work on scapular mechanics   Consulted and Agree with Plan of Care Patient        Problem List Patient Active Problem List   Diagnosis Date Noted  . Complete rotator cuff tear of left shoulder 07/05/2015  . Tennis elbow 05/30/2014  . Rotator cuff (capsule) sprain 03/10/2014  . Right shoulder pain 02/11/2014  . Memory difficulty 12/18/2012  . Gynecomastia, male 07/30/2012  . OSA (obstructive sleep apnea)  04/23/2011  . EXTERNAL HEMORRHOIDS 02/12/2011  . Prostate cancer ([place]) 11/12/2010  . PLANTAR FASCIITIS 04/09/2010  . Hypothyroidism 12/08/2009  . NEOPLASM OF UNCERTAIN BEHAVIOR OF SKIN 03/10/2009  . OSTEOARTHRITIS 02/14/2009  . HYPERLIPIDEMIA 02/08/2009  . HEARING DEFICIT 02/08/2009  . Essential hypertension 02/08/2009  . ALLERGIC RHINITIS 02/08/2009  . GERD 02/08/2009  . PROTEINURIA 02/08/2009  . DIVERTICULITIS, HX OF 02/08/2009    Rachit Grim PT, OCS 01/17/2016, 7:57 AM  [facility] [place]  [place], [place], [address] Phone: [phone]   Fax:  [phone]  Name: SHEAMUS HASTING MRN: [id number] Date of Birth: 1951-02-24

## 2016-01-22 ENCOUNTER — Ambulatory Visit: Payer: 59 | Admitting: Physical Therapy

## 2016-01-22 DIAGNOSIS — R29898 Other symptoms and signs involving the musculoskeletal system: Secondary | ICD-10-CM | POA: Diagnosis not present

## 2016-01-22 DIAGNOSIS — M25612 Stiffness of left shoulder, not elsewhere classified: Secondary | ICD-10-CM | POA: Diagnosis not present

## 2016-01-22 DIAGNOSIS — M25512 Pain in left shoulder: Secondary | ICD-10-CM

## 2016-01-22 NOTE — Therapy (Signed)
[facility] [address]  [place], [place], [address] Phone: [phone]   Fax:  [phone]  Physical Therapy Treatment  Patient Details  Name: Zachary [name] MRN: [id number] Date of Birth: 1951/08/26 Referring Provider: Kathryne Hitch  Encounter Date: 01/22/2016      PT End of Session - 01/22/16 0719    Visit Number 10   Number of Visits 20   Date for PT Re-Evaluation 02/29/16   PT Start Time 0715   PT Stop Time 0754   PT Time Calculation (min) 39 min      Past Medical History  Diagnosis Date  . Hyperlipidemia   . Hypertension   . Insomnia   . Hypothyroidism   . GERD (gastroesophageal reflux disease)   . Arthritis   . Hemorrhoids   . Hearing problem     hearing deficit  . Diverticulitis   . Wears glasses   . Wears hearing aid     both ears  . Cancer ([place]) 2015    positive prostate cancer bx  . Sleep apnea     uses a cpap  . Articular cartilage disease     left shoulder    Past Surgical History  Procedure Laterality Date  . Hemorrhoid surgery  08/2006  . Back surgery  1978    herniated disksurgery  . Tendon repair  June 06, 2011    right elbow, Dr. Percell Miller  . Cardiac catheterization  08/01/2010  . Colonoscopy    . Shoulder arthroscopy with subacromial decompression, rotator cuff repair and bicep tendon repair Right 03/10/2014    Procedure: RIGHT SHOULDER ARTHROSCOPY WITH SUBACROMIAL DECOMPRESSION, PARTIAL ACROMIOPLASTY WITH CORACOAROMIAL LABRUM DEBRIDEMENT RELEASE DISTAL CLAVICULECTOMY,  ROTATOR CUFF REPAIR AND EXTENSIVE DEBRIDEMENT;  Surgeon: Ninetta Lights, MD;  Location: [place];  Service: Orthopedics;  Laterality: Right;  . Exam under anesthesia with manipulation of shoulder Right 09/15/2014    Procedure: RIGHT SHOULDER MANIPULATION UNDER ANESTHESIA;  Surgeon: Ninetta Lights, MD;  Location: [facility];  Service: Orthopedics;  Laterality: Right;  .  Shoulder arthroscopy with rotator cuff repair and subacromial decompression Left 07/07/2015    Procedure: LEFT SHOULDER SCOPE DEBRIDEMENT, SUBACROMIAL DECOMPRESSION, DISTAL CLAVICULECTOMY, ROTATOR CUFF REPAIR  ;  Surgeon: Kathryne Hitch, MD;  Location: [facility];  Service: Orthopedics;  Laterality: Left;  ANESTHESIA: GENERAL, PRE/POST OP SCALENE  . Shoulder arthroscopy with distal clavicle resection Left 07/07/2015    Procedure: SHOULDER ARTHROSCOPY WITH DISTAL CLAVICLE RESECTION;  Surgeon: Kathryne Hitch, MD;  Location: [facility] [place];  Service: Orthopedics;  Laterality: Left;  . Shoulder arthroscopy with rotator cuff repair Left 12/07/2015    Procedure: LEFT SHOULDER ARTHROSCOPY DEBRIDEMENT, WITH ROTATOR CUFF REPAIR;  Surgeon: Ninetta Lights, MD;  Location: [facility];  Service: Orthopedics;  Laterality: Left;    There were no vitals filed for this visit.  Visit Diagnosis:  Left shoulder pain  Shoulder weakness  Shoulder stiffness, left      Subjective Assessment - 01/22/16 0718    Subjective States noted increased soreness since last night for unknown reason.  States woke this AM with 7/10 pain.  States otherwise has been 4-5/10 over the weekend.   Currently in Pain? Yes   Pain Score 7    Pain Location Shoulder   Pain Orientation Left            [facility] PT Assessment - 01/22/16 0001    Assessment  Next MD Visit 02/20/16   PROM   Left Shoulder Flexion 148 Degrees   Left Shoulder External Rotation 56 Degrees       TODAY'S TREATMENT TherEx - UBE, no resistance, 1'/1' Pulleys Flexion 20x, Scaption 20x  Manual - Grade 1 and 2 AP mobs L GH in 0 to 70 ABD with gentle ER and IR PROM; Grade 2 Flexion mobs in supine.  TherEx - Supine Wand Shoulder Flexion AAROM 15x AROM L shoulder ER in R Side-Lying 15x Prone B Scap retraction 4x5" (requires TC and VC with every rep but form still very poor, stopped due to pt stating this exercise is  painful) Standing B Shoulder EXT with Scap Retraction Red TB (12x5")x2 Standing back to wall with FR along spine Wand Shoulder Flexion AAROM 8x3" (to approx 80 - as high as he could go without scapular elevation) Standing L Shoulder IR Red TB 20x Standing L Shoulder ER Yellow TB 20x 1/2 Kneeling L Scapular retraction 2# 6x3" (TC for 1st 4 reps but then able to perform correctly without assist)             PT Short Term Goals - 01/15/16 0753    PT SHORT TERM GOAL #1   Title L shoulder PROM Flexion to 120, ER to 60 at 90 ABD, and IR to 45 at 90 ABD by 01/18/16  Met into Flexion improving into ER and IR   Status Partially Met           PT Long Term Goals - 01/15/16 0753    PT LONG TERM GOAL #1   Title L Shoulder AROM WFL all planes without limit by pain by 02/29/16   Status On-going   PT LONG TERM GOAL #2   Title L Shoulder MMT 4/5 or better all planes by 02/29/16   Status On-going   PT LONG TERM GOAL #3   Title pt able to return to all ADLs, chores, and work duties without limitation by shoulder pain, LOM, or weakness by 02/29/16   Status On-going   PT LONG TERM GOAL #4   Title pt able to sleep without limitation by shoulder pain by 02/29/16  some good nights, unable to lie on L side   Status Partially Met               Plan - 01/22/16 0720    Clinical Impression Statement Pt has noted increased soreness in L shoulder since discontinuing use of sling last week.  Pain was 4-5/10 over the weekend and is 7/10 upon waking this AM.  He denies using L UE or knowing reason for increased pain.  He is to RTW today but is to wear sling throughout workday. Despite c/o increased pain at start of treatment, he performed very well with regard to ROM displaying best PROM to date into Flexion and ER.  He was able to perform a few reps of standing and kneeling scapular retraction exercises without cueing but by and large this continues to be a significant challenge for Zachary [name].   PT Next  Visit Plan progress to standing AAROM exercises next treatment; continue to work on scapular mechanics   Consulted and Agree with Plan of Care Patient        Problem List Patient Active Problem List   Diagnosis Date Noted  . Complete rotator cuff tear of left shoulder 07/05/2015  . Tennis elbow 05/30/2014  . Rotator cuff (capsule) sprain 03/10/2014  . Right shoulder pain 02/11/2014  . Memory  difficulty 12/18/2012  . Gynecomastia, male 07/30/2012  . OSA (obstructive sleep apnea) 04/23/2011  . EXTERNAL HEMORRHOIDS 02/12/2011  . Prostate cancer ([place]) 11/12/2010  . PLANTAR FASCIITIS 04/09/2010  . Hypothyroidism 12/08/2009  . NEOPLASM OF UNCERTAIN BEHAVIOR OF SKIN 03/10/2009  . OSTEOARTHRITIS 02/14/2009  . HYPERLIPIDEMIA 02/08/2009  . HEARING DEFICIT 02/08/2009  . Essential hypertension 02/08/2009  . ALLERGIC RHINITIS 02/08/2009  . GERD 02/08/2009  . PROTEINURIA 02/08/2009  . DIVERTICULITIS, HX OF 02/08/2009    Johnaton Sonneborn PT, OCS 01/22/2016, 7:57 AM  [facility] [place], [place], [address] Phone: [phone]   Fax:  [phone]  Name: BINNIE VONDERHAAR MRN: [id number] Date of Birth: [birth year]

## 2016-01-24 ENCOUNTER — Ambulatory Visit: Payer: 59 | Attending: Orthopedic Surgery | Admitting: Physical Therapy

## 2016-01-24 DIAGNOSIS — R29898 Other symptoms and signs involving the musculoskeletal system: Secondary | ICD-10-CM | POA: Insufficient documentation

## 2016-01-24 DIAGNOSIS — M25512 Pain in left shoulder: Secondary | ICD-10-CM | POA: Insufficient documentation

## 2016-01-24 DIAGNOSIS — M25612 Stiffness of left shoulder, not elsewhere classified: Secondary | ICD-10-CM | POA: Diagnosis not present

## 2016-01-24 NOTE — Therapy (Signed)
[facility] [address]  [place], [place], [address] Phone: [phone]   Fax:  [phone]  Physical Therapy Treatment  Patient Details  Name: Zachary [name] MRN: [id number] Date of Birth: 11-14-1951 Referring Provider: Kathryne Hitch  Encounter Date: 01/24/2016      PT End of Session - 01/24/16 0716    Visit Number 11   Number of Visits 20   Date for PT Re-Evaluation 02/29/16   PT Start Time 0712   PT Stop Time 0758   PT Time Calculation (min) 46 min      Past Medical History  Diagnosis Date  . Hyperlipidemia   . Hypertension   . Insomnia   . Hypothyroidism   . GERD (gastroesophageal reflux disease)   . Arthritis   . Hemorrhoids   . Hearing problem     hearing deficit  . Diverticulitis   . Wears glasses   . Wears hearing aid     both ears  . Cancer ([place]) 2015    positive prostate cancer bx  . Sleep apnea     uses a cpap  . Articular cartilage disease     left shoulder    Past Surgical History  Procedure Laterality Date  . Hemorrhoid surgery  08/2006  . Back surgery  1978    herniated disksurgery  . Tendon repair  June 06, 2011    right elbow, Dr. Percell Miller  . Cardiac catheterization  08/01/2010  . Colonoscopy    . Shoulder arthroscopy with subacromial decompression, rotator cuff repair and bicep tendon repair Right 03/10/2014    Procedure: RIGHT SHOULDER ARTHROSCOPY WITH SUBACROMIAL DECOMPRESSION, PARTIAL ACROMIOPLASTY WITH CORACOAROMIAL LABRUM DEBRIDEMENT RELEASE DISTAL CLAVICULECTOMY,  ROTATOR CUFF REPAIR AND EXTENSIVE DEBRIDEMENT;  Surgeon: Ninetta Lights, MD;  Location: [facility];  Service: Orthopedics;  Laterality: Right;  . Exam under anesthesia with manipulation of shoulder Right 09/15/2014    Procedure: RIGHT SHOULDER MANIPULATION UNDER ANESTHESIA;  Surgeon: Ninetta Lights, MD;  Location: [facility];  Service: Orthopedics;  Laterality: Right;  . Shoulder  arthroscopy with rotator cuff repair and subacromial decompression Left 07/07/2015    Procedure: LEFT SHOULDER SCOPE DEBRIDEMENT, SUBACROMIAL DECOMPRESSION, DISTAL CLAVICULECTOMY, ROTATOR CUFF REPAIR  ;  Surgeon: Kathryne Hitch, MD;  Location: [facility];  Service: Orthopedics;  Laterality: Left;  ANESTHESIA: GENERAL, PRE/POST OP SCALENE  . Shoulder arthroscopy with distal clavicle resection Left 07/07/2015    Procedure: SHOULDER ARTHROSCOPY WITH DISTAL CLAVICLE RESECTION;  Surgeon: Kathryne Hitch, MD;  Location: [facility];  Service: Orthopedics;  Laterality: Left;  . Shoulder arthroscopy with rotator cuff repair Left 12/07/2015    Procedure: LEFT SHOULDER ARTHROSCOPY DEBRIDEMENT, WITH ROTATOR CUFF REPAIR;  Surgeon: Ninetta Lights, MD;  Location: [facility];  Service: Orthopedics;  Laterality: Left;    There were no vitals filed for this visit.  Visit Diagnosis:  Left shoulder pain  Shoulder weakness  Shoulder stiffness, left      Subjective Assessment - 01/24/16 0716    Subjective states pain is improved vs last treatment but still elevated and limiting ability to sleep.  States work is going fine and is wearing sling throughout workday.   Currently in Pain? Yes   Pain Score 5    Pain Location Shoulder   Pain Orientation Left       TODAY'S TREATMENT: TherEx - UBE, no resistance, 90"/90" Supine Wand Shoulder Flexion, ER, and Horiz ABD  AAROM 15x each  Manual - Grade 1 and 2 AP mobs L GH in 0 to 80 ABD with gentle ER and IR PROM; Grade 2 and some 3 Flexion mobs in supine.  TherEx - Hooklying on 1/2 FR pec stretch/t-spine extension 2'    B ER Yellow TB 15x2"    B Horiz ABD Yellow TB 10x R Side-Lying L Shoulder ER AROM 10x3" R Side-Lying L Shoulder Horiz ABD AROM 15x 1/2 Kneeling L Scapular retraction 2# 8x3" (TC for 1st 6 reps but then able to perform correctly without assist) Standing B Shoulder EXT with Scap Retraction Red TB  15x3" Standing L Shoulder IR Green TB 20x Standing L Shoulder ER Yellow TB  Standing L IR Stretch with Yellow TB 5x10" Standing L GH CW/CCW ball on wall 90 Flexion 15x each Pulleys Flexion and Scaption 15x each         PT Short Term Goals - 01/15/16 0753    PT SHORT TERM GOAL #1   Title L shoulder PROM Flexion to 120, ER to 60 at 90 ABD, and IR to 45 at 90 ABD by 01/18/16  Met into Flexion improving into ER and IR   Status Partially Met           PT Long Term Goals - 01/15/16 0753    PT LONG TERM GOAL #1   Title L Shoulder AROM WFL all planes without limit by pain by 02/29/16   Status On-going   PT LONG TERM GOAL #2   Title L Shoulder MMT 4/5 or better all planes by 02/29/16   Status On-going   PT LONG TERM GOAL #3   Title pt able to return to all ADLs, chores, and work duties without limitation by shoulder pain, LOM, or weakness by 02/29/16   Status On-going   PT LONG TERM GOAL #4   Title pt able to sleep without limitation by shoulder pain by 02/29/16  some good nights, unable to lie on L side   Status Partially Met               Plan - 01/24/16 0756    Clinical Impression Statement ROM continues to show good improvements in PROM and AAROM, still limited tolerance with AROM with impaired scapular mechanics.  He displays some improvement with scapular retraction exercises but still requires TC or at least VC with almost every rep.  Added pec stretch on foam roll today but holding on corner vs doorway stretch for now due to pain with this.  Will try to add this next week.   PT Next Visit Plan continue to work on scapular mechanics and pec stretching, may add corner stretch?   Consulted and Agree with Plan of Care Patient        Problem List Patient Active Problem List   Diagnosis Date Noted  . Complete rotator cuff tear of left shoulder 07/05/2015  . Tennis elbow 05/30/2014  . Rotator cuff (capsule) sprain 03/10/2014  . Right shoulder pain 02/11/2014  . Memory  difficulty 12/18/2012  . Gynecomastia, male 07/30/2012  . OSA (obstructive sleep apnea) 04/23/2011  . EXTERNAL HEMORRHOIDS 02/12/2011  . Prostate cancer ([facility]) 11/12/2010  . PLANTAR FASCIITIS 04/09/2010  . Hypothyroidism 12/08/2009  . NEOPLASM OF UNCERTAIN BEHAVIOR OF SKIN 03/10/2009  . OSTEOARTHRITIS 02/14/2009  . HYPERLIPIDEMIA 02/08/2009  . HEARING DEFICIT 02/08/2009  . Essential hypertension 02/08/2009  . ALLERGIC RHINITIS 02/08/2009  . GERD 02/08/2009  . PROTEINURIA 02/08/2009  . DIVERTICULITIS, HX OF 02/08/2009  Kenecia Barren PT, OCS 01/24/2016, 8:01 AM  [facility] [address]  [place], [place], [address] Phone: [phone]   Fax:  [phone]  Name: ZARIUS FURR MRN: [id number] Date of Birth: October 16, 1951

## 2016-01-29 ENCOUNTER — Ambulatory Visit: Payer: 59 | Admitting: Physical Therapy

## 2016-01-31 ENCOUNTER — Ambulatory Visit: Payer: 59 | Admitting: Physical Therapy

## 2016-01-31 DIAGNOSIS — R29898 Other symptoms and signs involving the musculoskeletal system: Secondary | ICD-10-CM | POA: Diagnosis not present

## 2016-01-31 DIAGNOSIS — M25512 Pain in left shoulder: Secondary | ICD-10-CM | POA: Diagnosis not present

## 2016-01-31 DIAGNOSIS — M25612 Stiffness of left shoulder, not elsewhere classified: Secondary | ICD-10-CM | POA: Diagnosis not present

## 2016-01-31 MED FILL — OMEPRAZOLE DR 40 MG CAPSULE: 40 | 90 days supply | Qty: 90 | Fill #2

## 2016-01-31 NOTE — Therapy (Signed)
[facility] [address]  [place], [place], [address] Phone: [phone]   Fax:  [phone]  Physical Therapy Treatment  Patient Details  Name: Zachary [name] MRN: [id number] Date of Birth: 25-Jul-1951 Referring Provider: Kathryne Hitch  Encounter Date: 01/31/2016      PT End of Session - 01/31/16 0717    Visit Number 12   Number of Visits 20   Date for PT Re-Evaluation 02/29/16   PT Start Time 0713   PT Stop Time 0759   PT Time Calculation (min) 46 min      Past Medical History  Diagnosis Date  . Hyperlipidemia   . Hypertension   . Insomnia   . Hypothyroidism   . GERD (gastroesophageal reflux disease)   . Arthritis   . Hemorrhoids   . Hearing problem     hearing deficit  . Diverticulitis   . Wears glasses   . Wears hearing aid     both ears  . Cancer ([place]) 2015    positive prostate cancer bx  . Sleep apnea     uses a cpap  . Articular cartilage disease     left shoulder    Past Surgical History  Procedure Laterality Date  . Hemorrhoid surgery  08/2006  . Back surgery  1978    herniated disksurgery  . Tendon repair  June 06, 2011    right elbow, Dr. Percell Miller  . Cardiac catheterization  08/01/2010  . Colonoscopy    . Shoulder arthroscopy with subacromial decompression, rotator cuff repair and bicep tendon repair Right 03/10/2014    Procedure: RIGHT SHOULDER ARTHROSCOPY WITH SUBACROMIAL DECOMPRESSION, PARTIAL ACROMIOPLASTY WITH CORACOAROMIAL LABRUM DEBRIDEMENT RELEASE DISTAL CLAVICULECTOMY,  ROTATOR CUFF REPAIR AND EXTENSIVE DEBRIDEMENT;  Surgeon: Ninetta Lights, MD;  Location: [facility];  Service: Orthopedics;  Laterality: Right;  . Exam under anesthesia with manipulation of shoulder Right 09/15/2014    Procedure: RIGHT SHOULDER MANIPULATION UNDER ANESTHESIA;  Surgeon: Ninetta Lights, MD;  Location: [facility];  Service: Orthopedics;  Laterality: Right;  . Shoulder  arthroscopy with rotator cuff repair and subacromial decompression Left 07/07/2015    Procedure: LEFT SHOULDER SCOPE DEBRIDEMENT, SUBACROMIAL DECOMPRESSION, DISTAL CLAVICULECTOMY, ROTATOR CUFF REPAIR  ;  Surgeon: Kathryne Hitch, MD;  Location: [facility];  Service: Orthopedics;  Laterality: Left;  ANESTHESIA: GENERAL, PRE/POST OP SCALENE  . Shoulder arthroscopy with distal clavicle resection Left 07/07/2015    Procedure: SHOULDER ARTHROSCOPY WITH DISTAL CLAVICLE RESECTION;  Surgeon: Kathryne Hitch, MD;  Location: [facility];  Service: Orthopedics;  Laterality: Left;  . Shoulder arthroscopy with rotator cuff repair Left 12/07/2015    Procedure: LEFT SHOULDER ARTHROSCOPY DEBRIDEMENT, WITH ROTATOR CUFF REPAIR;  Surgeon: Ninetta Lights, MD;  Location: [facility];  Service: Orthopedics;  Laterality: Left;    There were no vitals filed for this visit.  Visit Diagnosis:  Left shoulder pain  Shoulder weakness  Shoulder stiffness, left      Subjective Assessment - 01/31/16 0720    Subjective states shoulder feels about the same: limited function, difficulty sleeping,    Currently in Pain? Yes   Pain Score 4    Pain Location Shoulder   Pain Orientation Left            [facility] PT Assessment - 01/31/16 0001    PROM   Left Shoulder Flexion 152 Degrees   Left Shoulder ABduction 98 Degrees   Left  Shoulder Internal Rotation 68 Degrees  at 80 ABD   Left Shoulder External Rotation 67 Degrees     TODAY'S TREATMENT: TherEx - UBE, lvl 1.5 90"/90" Low Row 20# (10x3")x2 Hooklying on 1/2 FR pec stretch/t-spine extension 2'  Wand Pullover 15x    Wand Chest Press Plus 5# 15x    B ER Yellow TB 15x3"  B Horiz ABD Yellow TB 15x Standing hand on wall shoulder Flexion slide 15x Standing back to wall with FR along spine:    Wand B Shoulder Flexion to 90 10x3"    B Shoulder Ext with Scap retraction Red TB 10x3" R Side-Lying L Shoulder CW/CCW in 90 ABD 2#  15x each R Side-Lying L Shoulder ER 1# 15x R Side-Lying L Shoulder Horiz ABD AROM 15x 1/2 Kneeling L Scapular retraction and row 3# 10x  Manual - Grade 1 and 2 AP mobs L GH in 0 to 80 ABD with gentle ER and IR PROM; Grade 2 and some 3 Flexion mobs in supine.            PT Short Term Goals - 01/31/16 0759    PT SHORT TERM GOAL #1   Title L shoulder PROM Flexion to 120, ER to 60 at 90 ABD, and IR to 45 at 90 ABD by 01/18/16   Status Achieved           PT Long Term Goals - 01/31/16 0759    PT LONG TERM GOAL #1   Title L Shoulder AROM WFL all planes without limit by pain by 02/29/16   Status On-going   PT LONG TERM GOAL #2   Title L Shoulder MMT 4/5 or better all planes by 02/29/16   Status On-going   PT LONG TERM GOAL #3   Title pt able to return to all ADLs, chores, and work duties without limitation by shoulder pain, LOM, or weakness by 02/29/16   Status On-going   PT LONG TERM GOAL #4   Title pt able to sleep without limitation by shoulder pain by 02/29/16   Status On-going               Plan - 01/31/16 0800    Clinical Impression Statement best PROM to date, is improving with scap mechanics but still impaired above 90 dg flexion   PT Next Visit Plan continue to work on scapular mechanics and pec stretching, may add corner stretch?   Consulted and Agree with Plan of Care Patient        Problem List Patient Active Problem List   Diagnosis Date Noted  . Complete rotator cuff tear of left shoulder 07/05/2015  . Tennis elbow 05/30/2014  . Rotator cuff (capsule) sprain 03/10/2014  . Right shoulder pain 02/11/2014  . Memory difficulty 12/18/2012  . Gynecomastia, male 07/30/2012  . OSA (obstructive sleep apnea) 04/23/2011  . EXTERNAL HEMORRHOIDS 02/12/2011  . Prostate cancer ([facility]) 11/12/2010  . PLANTAR FASCIITIS 04/09/2010  . Hypothyroidism 12/08/2009  . NEOPLASM OF UNCERTAIN BEHAVIOR OF SKIN 03/10/2009  . OSTEOARTHRITIS 02/14/2009  . HYPERLIPIDEMIA 02/08/2009   . HEARING DEFICIT 02/08/2009  . Essential hypertension 02/08/2009  . ALLERGIC RHINITIS 02/08/2009  . GERD 02/08/2009  . PROTEINURIA 02/08/2009  . DIVERTICULITIS, HX OF 02/08/2009    Esbeydi Manago PT, OCS 01/31/2016, 8:01 AM  [facility] [address]  [place], [place], [address] Phone: [phone]   Fax:  [phone]  Name: Zachary [name] MRN: [id number] Date of Birth:  09/13/1951    

## 2016-02-05 ENCOUNTER — Ambulatory Visit: Payer: 59 | Admitting: Physical Therapy

## 2016-02-05 DIAGNOSIS — M25612 Stiffness of left shoulder, not elsewhere classified: Secondary | ICD-10-CM | POA: Diagnosis not present

## 2016-02-05 DIAGNOSIS — M25512 Pain in left shoulder: Secondary | ICD-10-CM

## 2016-02-05 DIAGNOSIS — R29898 Other symptoms and signs involving the musculoskeletal system: Secondary | ICD-10-CM

## 2016-02-05 NOTE — Therapy (Signed)
[facility] [address]  [place], [place], [address] Phone: [phone]   Fax:  [phone]  Physical Therapy Treatment  Patient Details  Name: Zachary [name] MRN: [id number] Date of Birth: [birth year] Referring Provider: Kathryne Hitch  Encounter Date: 02/05/2016      PT End of Session - 02/05/16 0721    Visit Number 13   Number of Visits 20   Date for PT Re-Evaluation 02/29/16   PT Start Time [id number]   PT Stop Time 0753   PT Time Calculation (min) 36 min      Past Medical History  Diagnosis Date  . Hyperlipidemia   . Hypertension   . Insomnia   . Hypothyroidism   . GERD (gastroesophageal reflux disease)   . Arthritis   . Hemorrhoids   . Hearing problem     hearing deficit  . Diverticulitis   . Wears glasses   . Wears hearing aid     both ears  . Cancer ([facility]) 2015    positive prostate cancer bx  . Sleep apnea     uses a cpap  . Articular cartilage disease     left shoulder    Past Surgical History  Procedure Laterality Date  . Hemorrhoid surgery  08/2006  . Back surgery  1978    herniated disksurgery  . Tendon repair  June 06, 2011    right elbow, Dr. Percell Miller  . Cardiac catheterization  08/01/2010  . Colonoscopy    . Shoulder arthroscopy with subacromial decompression, rotator cuff repair and bicep tendon repair Right 03/10/2014    Procedure: RIGHT SHOULDER ARTHROSCOPY WITH SUBACROMIAL DECOMPRESSION, PARTIAL ACROMIOPLASTY WITH CORACOAROMIAL LABRUM DEBRIDEMENT RELEASE DISTAL CLAVICULECTOMY,  ROTATOR CUFF REPAIR AND EXTENSIVE DEBRIDEMENT;  Surgeon: Ninetta Lights, MD;  Location: [facility];  Service: Orthopedics;  Laterality: Right;  . Exam under anesthesia with manipulation of shoulder Right 09/15/2014    Procedure: RIGHT SHOULDER MANIPULATION UNDER ANESTHESIA;  Surgeon: Ninetta Lights, MD;  Location: [facility];  Service: Orthopedics;  Laterality: Right;  .  Shoulder arthroscopy with rotator cuff repair and subacromial decompression Left 07/07/2015    Procedure: LEFT SHOULDER SCOPE DEBRIDEMENT, SUBACROMIAL DECOMPRESSION, DISTAL CLAVICULECTOMY, ROTATOR CUFF REPAIR  ;  Surgeon: Kathryne Hitch, MD;  Location: [facility];  Service: Orthopedics;  Laterality: Left;  ANESTHESIA: GENERAL, PRE/POST OP SCALENE  . Shoulder arthroscopy with distal clavicle resection Left 07/07/2015    Procedure: SHOULDER ARTHROSCOPY WITH DISTAL CLAVICLE RESECTION;  Surgeon: Kathryne Hitch, MD;  Location: [facility];  Service: Orthopedics;  Laterality: Left;  . Shoulder arthroscopy with rotator cuff repair Left 12/07/2015    Procedure: LEFT SHOULDER ARTHROSCOPY DEBRIDEMENT, WITH ROTATOR CUFF REPAIR;  Surgeon: Ninetta Lights, MD;  Location: [facility];  Service: Orthopedics;  Laterality: Left;    There were no vitals filed for this visit.  Visit Diagnosis:  Left shoulder pain  Shoulder weakness  Shoulder stiffness, left      Subjective Assessment - 02/05/16 0755    Subjective no change, still with difficulty sleeping and AVG pain 4/10   Currently in Pain? Yes   Pain Score 4    Pain Location Shoulder   Pain Orientation Left           TODAY'S TREATMENT: TherEx - UBE, lvl 1.5 90"/90" Hooklying on FR pec stretch/t-spine extension 2'  Wand with 3# Pullover 15x  Wand Chest Press Plus 3# 15x  B ER Yellow TB 15x3"  B Horiz ABD Red TB 15x R Side-Lying L Shoulder ER 1# 20x R Side-Lying L Shoulder CW/CCW in 90 ABD 2# 15x each Standing back to wall with FR along spine:  B Shoulder Ext with Scap retraction Red TB 12x3"  Wand B Shoulder Flexion to 90 12x3"    B Shoulder Flexion AROM 6x2" Low Row 25# 15x3" Pulleys Flexion and Scaption 15x each Standing hand on wall shoulder Flexion slide 15x Wall Push-up Plus 10x 1/2 Kneeling L Scapular retraction and row 3# 12x Prone Scap retraction with B shoulder Ext 12x3"  (TC for most reps for good retraction, best performance to date)            PT Short Term Goals - 01/31/16 0759    PT SHORT TERM GOAL #1   Title L shoulder PROM Flexion to 120, ER to 60 at 90 ABD, and IR to 45 at 90 ABD by 01/18/16   Status Achieved           PT Long Term Goals - 01/31/16 0759    PT LONG TERM GOAL #1   Title L Shoulder AROM WFL all planes without limit by pain by 02/29/16   Status On-going   PT LONG TERM GOAL #2   Title L Shoulder MMT 4/5 or better all planes by 02/29/16   Status On-going   PT LONG TERM GOAL #3   Title pt able to return to all ADLs, chores, and work duties without limitation by shoulder pain, LOM, or weakness by 02/29/16   Status On-going   PT LONG TERM GOAL #4   Title pt able to sleep without limitation by shoulder pain by 02/29/16   Status On-going               Plan - 02/05/16 0756    Clinical Impression Statement scapular mechanics improving and shoulder AROM flexion to 90 with good mechanics; still with sig pain and difficulty sleeping however   PT Next Visit Plan continue to work on scapular mechanics and pec stretching, may add corner stretch?   Consulted and Agree with Plan of Care Patient        Problem List Patient Active Problem List   Diagnosis Date Noted  . Complete rotator cuff tear of left shoulder 07/05/2015  . Tennis elbow 05/30/2014  . Rotator cuff (capsule) sprain 03/10/2014  . Right shoulder pain 02/11/2014  . Memory difficulty 12/18/2012  . Gynecomastia, male 07/30/2012  . OSA (obstructive sleep apnea) 04/23/2011  . EXTERNAL HEMORRHOIDS 02/12/2011  . Prostate cancer ([place]) 11/12/2010  . PLANTAR FASCIITIS 04/09/2010  . Hypothyroidism 12/08/2009  . NEOPLASM OF UNCERTAIN BEHAVIOR OF SKIN 03/10/2009  . OSTEOARTHRITIS 02/14/2009  . HYPERLIPIDEMIA 02/08/2009  . HEARING DEFICIT 02/08/2009  . Essential hypertension 02/08/2009  . ALLERGIC RHINITIS 02/08/2009  . GERD 02/08/2009  . PROTEINURIA 02/08/2009  .  DIVERTICULITIS, HX OF 02/08/2009    Yudit Modesitt PT, OCS 02/05/2016, 7:57 AM  [facility] [place], [place], [address] Phone: [phone]   Fax:  [phone]  Name: Zachary [name] MRN: [id number] Date of Birth: 07/19/1951

## 2016-02-07 ENCOUNTER — Ambulatory Visit: Payer: 59 | Admitting: Physical Therapy

## 2016-02-07 DIAGNOSIS — R29898 Other symptoms and signs involving the musculoskeletal system: Secondary | ICD-10-CM | POA: Diagnosis not present

## 2016-02-07 DIAGNOSIS — M25512 Pain in left shoulder: Secondary | ICD-10-CM

## 2016-02-07 DIAGNOSIS — M25612 Stiffness of left shoulder, not elsewhere classified: Secondary | ICD-10-CM | POA: Diagnosis not present

## 2016-02-07 NOTE — Therapy (Signed)
[facility] [address]  [place], [place], [address] Phone: [phone]   Fax:  [phone]  Physical Therapy Treatment  Patient Details  Name: Zachary [name] MRN: [id number] Date of Birth: 18-Dec-1951 Referring Provider: Kathryne Hitch  Encounter Date: 02/07/2016      PT End of Session - 02/07/16 0716    Visit Number 14   Number of Visits 20   Date for PT Re-Evaluation 02/29/16   PT Start Time 0715   PT Stop Time 0813   PT Time Calculation (min) 58 min      Past Medical History  Diagnosis Date  . Hyperlipidemia   . Hypertension   . Insomnia   . Hypothyroidism   . GERD (gastroesophageal reflux disease)   . Arthritis   . Hemorrhoids   . Hearing problem     hearing deficit  . Diverticulitis   . Wears glasses   . Wears hearing aid     both ears  . Cancer ([place]) 2015    positive prostate cancer bx  . Sleep apnea     uses a cpap  . Articular cartilage disease     left shoulder    Past Surgical History  Procedure Laterality Date  . Hemorrhoid surgery  08/2006  . Back surgery  1978    herniated disksurgery  . Tendon repair  June 06, 2011    right elbow, Dr. Percell Miller  . Cardiac catheterization  08/01/2010  . Colonoscopy    . Shoulder arthroscopy with subacromial decompression, rotator cuff repair and bicep tendon repair Right 03/10/2014    Procedure: RIGHT SHOULDER ARTHROSCOPY WITH SUBACROMIAL DECOMPRESSION, PARTIAL ACROMIOPLASTY WITH CORACOAROMIAL LABRUM DEBRIDEMENT RELEASE DISTAL CLAVICULECTOMY,  ROTATOR CUFF REPAIR AND EXTENSIVE DEBRIDEMENT;  Surgeon: Ninetta Lights, MD;  Location: [facility];  Service: Orthopedics;  Laterality: Right;  . Exam under anesthesia with manipulation of shoulder Right 09/15/2014    Procedure: RIGHT SHOULDER MANIPULATION UNDER ANESTHESIA;  Surgeon: Ninetta Lights, MD;  Location: [facility];  Service: Orthopedics;  Laterality: Right;  .  Shoulder arthroscopy with rotator cuff repair and subacromial decompression Left 07/07/2015    Procedure: LEFT SHOULDER SCOPE DEBRIDEMENT, SUBACROMIAL DECOMPRESSION, DISTAL CLAVICULECTOMY, ROTATOR CUFF REPAIR  ;  Surgeon: Kathryne Hitch, MD;  Location: [facility];  Service: Orthopedics;  Laterality: Left;  ANESTHESIA: GENERAL, PRE/POST OP SCALENE  . Shoulder arthroscopy with distal clavicle resection Left 07/07/2015    Procedure: SHOULDER ARTHROSCOPY WITH DISTAL CLAVICLE RESECTION;  Surgeon: Kathryne Hitch, MD;  Location: [facility];  Service: Orthopedics;  Laterality: Left;  . Shoulder arthroscopy with rotator cuff repair Left 12/07/2015    Procedure: LEFT SHOULDER ARTHROSCOPY DEBRIDEMENT, WITH ROTATOR CUFF REPAIR;  Surgeon: Ninetta Lights, MD;  Location: [facility];  Service: Orthopedics;  Laterality: Left;    There were no vitals filed for this visit.  Visit Diagnosis:  Left shoulder pain  Shoulder weakness  Shoulder stiffness, left      Subjective Assessment - 02/07/16 0716    Subjective states has noted some increased soreness in L shoulder past 2 days for unknown reason   Currently in Pain? Yes   Pain Score 6    Pain Location Shoulder   Pain Orientation Left   Pain Descriptors / Indicators Aching   Pain Frequency Constant            [facility] PT Assessment - 02/07/16 0001    AROM  Left Shoulder Flexion 122 Degrees       TODAY'S TREATMENT: TherEx - UBE, lvl 1.5 90"/90" Hooklying on FR pec stretch/t-spine extension 2'  Wand with 3# Pullover 15x  Wand Chest Press Plus 3# 15x    R D2 Flexion Yellow TB 10x  B ER Yellow TB 15x3" Seated elbow supported approx 75 dg ABD ER stretch with Red TB 15x Seated elbow supported approx 75 dg ABD ER Yellow TB 15x Standing B Shoulder Ext with Scap retraction Red TB 10x5" Tricep pushdown 15# 15x Narrow Pulldown 20# 15x  Kinesiotape - 4 strips L Shoulder: 75% subacromial space, 50% ant  and post delt, 50% lateral delt into UT  Corner Pec Stretch 3x20" Standing hand on wall shoulder Flexion slide 10x Ball on wall shoulder 90 dg Flex CW/CCW 15x each Ball on wall shoulder 120 dg Flex CW/CCW 15x each  Vasopneumtic compression L Shoulder Low Pressure, 15', 40dg                      PT Short Term Goals - 01/31/16 0759    PT SHORT TERM GOAL #1   Title L shoulder PROM Flexion to 120, ER to 60 at 90 ABD, and IR to 45 at 90 ABD by 01/18/16   Status Achieved           PT Long Term Goals - 01/31/16 0759    PT LONG TERM GOAL #1   Title L Shoulder AROM WFL all planes without limit by pain by 02/29/16   Status On-going   PT LONG TERM GOAL #2   Title L Shoulder MMT 4/5 or better all planes by 02/29/16   Status On-going   PT LONG TERM GOAL #3   Title pt able to return to all ADLs, chores, and work duties without limitation by shoulder pain, LOM, or weakness by 02/29/16   Status On-going   PT LONG TERM GOAL #4   Title pt able to sleep without limitation by shoulder pain by 02/29/16   Status On-going               Plan - 02/07/16 0724    Clinical Impression Statement pt with increased soreness in L shoulder past 2 days.  Only mild increases last treatment so not sure if this was cause of increased pain or not.  No significant increases today in intensity due to this and applied kinesiotape to L shoulder this AM to see if can decrease pain.   PT Next Visit Plan continue to work on scapular mechanics and pec stretching, may add corner stretch?   Consulted and Agree with Plan of Care Patient        Problem List Patient Active Problem List   Diagnosis Date Noted  . Complete rotator cuff tear of left shoulder 07/05/2015  . Tennis elbow 05/30/2014  . Rotator cuff (capsule) sprain 03/10/2014  . Right shoulder pain 02/11/2014  . Memory difficulty 12/18/2012  . Gynecomastia, male 07/30/2012  . OSA (obstructive sleep apnea) 04/23/2011  . EXTERNAL HEMORRHOIDS  02/12/2011  . Prostate cancer ([place]) 11/12/2010  . PLANTAR FASCIITIS 04/09/2010  . Hypothyroidism 12/08/2009  . NEOPLASM OF UNCERTAIN BEHAVIOR OF SKIN 03/10/2009  . OSTEOARTHRITIS 02/14/2009  . HYPERLIPIDEMIA 02/08/2009  . HEARING DEFICIT 02/08/2009  . Essential hypertension 02/08/2009  . ALLERGIC RHINITIS 02/08/2009  . GERD 02/08/2009  . PROTEINURIA 02/08/2009  . DIVERTICULITIS, HX OF 02/08/2009    Tirsa Gail PT, OCS 02/07/2016, 8:57 AM  [facility]  [facility] [address]  [place], [place], [address] Phone: [phone]   Fax:  [phone]  Name: Zachary [name] MRN: [id number] Date of Birth: [birth year]

## 2016-02-12 ENCOUNTER — Ambulatory Visit: Payer: 59 | Admitting: Physical Therapy

## 2016-02-12 DIAGNOSIS — M25512 Pain in left shoulder: Secondary | ICD-10-CM

## 2016-02-12 DIAGNOSIS — R29898 Other symptoms and signs involving the musculoskeletal system: Secondary | ICD-10-CM

## 2016-02-12 DIAGNOSIS — M25612 Stiffness of left shoulder, not elsewhere classified: Secondary | ICD-10-CM | POA: Diagnosis not present

## 2016-02-12 NOTE — Therapy (Signed)
[facility] [address]  [place], [place], [address] Phone: [phone]   Fax:  [phone]  Physical Therapy Treatment  Patient Details  Name: Zachary [name] MRN: [id number] Date of Birth: Mar 27, 1951 Referring Provider: Kathryne Hitch  Encounter Date: 02/12/2016      PT End of Session - 02/12/16 0720    Visit Number 15   Number of Visits 20   Date for PT Re-Evaluation 02/29/16   PT Start Time 0716   PT Stop Time 0810   PT Time Calculation (min) 54 min      Past Medical History  Diagnosis Date  . Hyperlipidemia   . Hypertension   . Insomnia   . Hypothyroidism   . GERD (gastroesophageal reflux disease)   . Arthritis   . Hemorrhoids   . Hearing problem     hearing deficit  . Diverticulitis   . Wears glasses   . Wears hearing aid     both ears  . Cancer ([place]) 2015    positive prostate cancer bx  . Sleep apnea     uses a cpap  . Articular cartilage disease     left shoulder    Past Surgical History  Procedure Laterality Date  . Hemorrhoid surgery  08/2006  . Back surgery  1978    herniated disksurgery  . Tendon repair  June 06, 2011    right elbow, Dr. Percell Miller  . Cardiac catheterization  08/01/2010  . Colonoscopy    . Shoulder arthroscopy with subacromial decompression, rotator cuff repair and bicep tendon repair Right 03/10/2014    Procedure: RIGHT SHOULDER ARTHROSCOPY WITH SUBACROMIAL DECOMPRESSION, PARTIAL ACROMIOPLASTY WITH CORACOAROMIAL LABRUM DEBRIDEMENT RELEASE DISTAL CLAVICULECTOMY,  ROTATOR CUFF REPAIR AND EXTENSIVE DEBRIDEMENT;  Surgeon: Ninetta Lights, MD;  Location: [facility];  Service: Orthopedics;  Laterality: Right;  . Exam under anesthesia with manipulation of shoulder Right 09/15/2014    Procedure: RIGHT SHOULDER MANIPULATION UNDER ANESTHESIA;  Surgeon: Ninetta Lights, MD;  Location: [facility];  Service: Orthopedics;  Laterality: Right;  .  Shoulder arthroscopy with rotator cuff repair and subacromial decompression Left 07/07/2015    Procedure: LEFT SHOULDER SCOPE DEBRIDEMENT, SUBACROMIAL DECOMPRESSION, DISTAL CLAVICULECTOMY, ROTATOR CUFF REPAIR  ;  Surgeon: Kathryne Hitch, MD;  Location: [facility];  Service: Orthopedics;  Laterality: Left;  ANESTHESIA: GENERAL, PRE/POST OP SCALENE  . Shoulder arthroscopy with distal clavicle resection Left 07/07/2015    Procedure: SHOULDER ARTHROSCOPY WITH DISTAL CLAVICLE RESECTION;  Surgeon: Kathryne Hitch, MD;  Location: [facility];  Service: Orthopedics;  Laterality: Left;  . Shoulder arthroscopy with rotator cuff repair Left 12/07/2015    Procedure: LEFT SHOULDER ARTHROSCOPY DEBRIDEMENT, WITH ROTATOR CUFF REPAIR;  Surgeon: Ninetta Lights, MD;  Location: [facility];  Service: Orthopedics;  Laterality: Left;    There were no vitals filed for this visit.  Visit Diagnosis:  Left shoulder pain  Shoulder weakness  Shoulder stiffness, left      Subjective Assessment - 02/12/16 0720    Subjective states is very frustrated with lack of progress.  States feels the same with regard to pain and has noted more popping and catching lately.   Currently in Pain? Yes   Pain Score 6    Pain Location Shoulder   Pain Orientation Left           TODAY'S TREATMENT: TherEx - UBE, lvl 2.5 90"/90"  Hooklying on FR pec stretch/t-spine  extension 2'  Wand with 3# Pullover 15x  B ER Yellow TB 15x3"  R D2 Flexion Yellow TB 10x R Side-Lying L Shoulder ER 1# 20x R Side-Lying L Shoulder ABD AROM 10x R Side-Lying L Shoulder Horiz ABD 1# 20x R Side-Lying L Shoulder CW/CCW 4# 15x each Standing Hand on ball on wall CW/CCW 15x each Standing Scap Retraction with B Shoulder Ext to Neutral Red TB 12x5" Standing Back to Wall with FR along spine Wand Shoulder Flexion AAROM 12x (to approx 120 dgs with good scapular mechanics) Wall ladder shoulder flexion  10x Corner Pec Stretch 3x20" Towel IR stretch 3x20" Low Row 20# 15x  Vasopneumtic compression L Shoulder Low Pressure, 15', 40dg with     IFC (80-150 Hz) x 15' to see if can get better pain reduction            PT Short Term Goals - 01/31/16 0759    PT SHORT TERM GOAL #1   Title L shoulder PROM Flexion to 120, ER to 60 at 90 ABD, and IR to 45 at 90 ABD by 01/18/16   Status Achieved           PT Long Term Goals - 01/31/16 0759    PT LONG TERM GOAL #1   Title L Shoulder AROM WFL all planes without limit by pain by 02/29/16   Status On-going   PT LONG TERM GOAL #2   Title L Shoulder MMT 4/5 or better all planes by 02/29/16   Status On-going   PT LONG TERM GOAL #3   Title pt able to return to all ADLs, chores, and work duties without limitation by shoulder pain, LOM, or weakness by 02/29/16   Status On-going   PT LONG TERM GOAL #4   Title pt able to sleep without limitation by shoulder pain by 02/29/16   Status On-going               Plan - 02/12/16 0729    Clinical Impression Statement Pt still with elevated pain rating 6/10.  States he is noting more popping and catching in L shoulder lately and states doesn't feel as though he is improving.  Kept intensity of treatment down today but still worked through full ROM.  Added IFC with vaso today to see if can get better pain reduction.   PT Next Visit Plan continue to work on scapular mechanics and pec stretching; manual and modalities for pain PRN   Consulted and Agree with Plan of Care Patient        Problem List Patient Active Problem List   Diagnosis Date Noted  . Complete rotator cuff tear of left shoulder 07/05/2015  . Tennis elbow 05/30/2014  . Rotator cuff (capsule) sprain 03/10/2014  . Right shoulder pain 02/11/2014  . Memory difficulty 12/18/2012  . Gynecomastia, male 07/30/2012  . OSA (obstructive sleep apnea) 04/23/2011  . EXTERNAL HEMORRHOIDS 02/12/2011  . Prostate cancer ([place]) 11/12/2010  . PLANTAR  FASCIITIS 04/09/2010  . Hypothyroidism 12/08/2009  . NEOPLASM OF UNCERTAIN BEHAVIOR OF SKIN 03/10/2009  . OSTEOARTHRITIS 02/14/2009  . HYPERLIPIDEMIA 02/08/2009  . HEARING DEFICIT 02/08/2009  . Essential hypertension 02/08/2009  . ALLERGIC RHINITIS 02/08/2009  . GERD 02/08/2009  . PROTEINURIA 02/08/2009  . DIVERTICULITIS, HX OF 02/08/2009    Skiler Olden PT, OCS 02/12/2016, 8:27 AM  [facility] [address]  [place], [place], [address] Phone: [phone]   Fax:  [phone]  Name: Zachary [name]  [place] MRN: [id number] Date of Birth: [birth year]

## 2016-02-14 ENCOUNTER — Ambulatory Visit: Payer: 59 | Admitting: Physical Therapy

## 2016-02-14 DIAGNOSIS — M25512 Pain in left shoulder: Secondary | ICD-10-CM | POA: Diagnosis not present

## 2016-02-14 DIAGNOSIS — M25612 Stiffness of left shoulder, not elsewhere classified: Secondary | ICD-10-CM

## 2016-02-14 DIAGNOSIS — R29898 Other symptoms and signs involving the musculoskeletal system: Secondary | ICD-10-CM | POA: Diagnosis not present

## 2016-02-14 NOTE — Therapy (Signed)
[facility] [address]  [place], [place], [address] Phone: [phone]   Fax:  [phone]  Physical Therapy Treatment  Patient Details  Name: Zachary [name] MRN: [id number] Date of Birth: 08/06/1951 Referring Provider: Kathryne Hitch  Encounter Date: 02/14/2016      PT End of Session - 02/14/16 0716    Visit Number 16   Number of Visits 20   Date for PT Re-Evaluation 02/29/16   PT Start Time 0714   PT Stop Time 0801   PT Time Calculation (min) 47 min      Past Medical History  Diagnosis Date  . Hyperlipidemia   . Hypertension   . Insomnia   . Hypothyroidism   . GERD (gastroesophageal reflux disease)   . Arthritis   . Hemorrhoids   . Hearing problem     hearing deficit  . Diverticulitis   . Wears glasses   . Wears hearing aid     both ears  . Cancer ([place]) 2015    positive prostate cancer bx  . Sleep apnea     uses a cpap  . Articular cartilage disease     left shoulder    Past Surgical History  Procedure Laterality Date  . Hemorrhoid surgery  08/2006  . Back surgery  1978    herniated disksurgery  . Tendon repair  June 06, 2011    right elbow, Dr. Percell Miller  . Cardiac catheterization  08/01/2010  . Colonoscopy    . Shoulder arthroscopy with subacromial decompression, rotator cuff repair and bicep tendon repair Right 03/10/2014    Procedure: RIGHT SHOULDER ARTHROSCOPY WITH SUBACROMIAL DECOMPRESSION, PARTIAL ACROMIOPLASTY WITH CORACOAROMIAL LABRUM DEBRIDEMENT RELEASE DISTAL CLAVICULECTOMY,  ROTATOR CUFF REPAIR AND EXTENSIVE DEBRIDEMENT;  Surgeon: Ninetta Lights, MD;  Location: [facility];  Service: Orthopedics;  Laterality: Right;  . Exam under anesthesia with manipulation of shoulder Right 09/15/2014    Procedure: RIGHT SHOULDER MANIPULATION UNDER ANESTHESIA;  Surgeon: Ninetta Lights, MD;  Location: [facility];  Service: Orthopedics;  Laterality: Right;  .  Shoulder arthroscopy with rotator cuff repair and subacromial decompression Left 07/07/2015    Procedure: LEFT SHOULDER SCOPE DEBRIDEMENT, SUBACROMIAL DECOMPRESSION, DISTAL CLAVICULECTOMY, ROTATOR CUFF REPAIR  ;  Surgeon: Kathryne Hitch, MD;  Location: [facility];  Service: Orthopedics;  Laterality: Left;  ANESTHESIA: GENERAL, PRE/POST OP SCALENE  . Shoulder arthroscopy with distal clavicle resection Left 07/07/2015    Procedure: SHOULDER ARTHROSCOPY WITH DISTAL CLAVICLE RESECTION;  Surgeon: Kathryne Hitch, MD;  Location: [facility];  Service: Orthopedics;  Laterality: Left;  . Shoulder arthroscopy with rotator cuff repair Left 12/07/2015    Procedure: LEFT SHOULDER ARTHROSCOPY DEBRIDEMENT, WITH ROTATOR CUFF REPAIR;  Surgeon: Ninetta Lights, MD;  Location: [facility];  Service: Orthopedics;  Laterality: Left;    There were no vitals filed for this visit.  Visit Diagnosis:  Left shoulder pain  Shoulder weakness  Shoulder stiffness, left      Subjective Assessment - 02/14/16 0717    Subjective No change, states e-stim with vaso at end of last treatment didn't seem to offer any additional benefit.  States is tired today due to only getting a couple hours of sleep.  States is consistent with HEP and verbalizes all exercises well.   Currently in Pain? Yes   Pain Score 6    Pain Location Shoulder   Pain Orientation Left  [facility] PT Assessment - 02/14/16 0001    Assessment   Next MD Visit 02/20/16   AROM   Left Shoulder Flexion 122 Degrees   PROM   Left Shoulder Flexion 156 Degrees   Left Shoulder Internal Rotation 70 Degrees   Left Shoulder External Rotation 70 Degrees       TODAY'S TREATMENT: TherEx - UBE, lvl 2.5 90"/90"  Hooklying on FR pec stretch/t-spine extension 2'  Wand with 3# Pullover 15x    B Shoulder Horiz ABD Green TB 15x  B ER Yellow TB 20x  Manual - L GH Grade 2 and some 3 caudal and AP mobs L GH with  gentle stretching all planes  R Side-Lying L GH ABD 1# 20x 10-30dg R Side-Lying L GH ABD 1# 15x 0-90dg R Side-Lying L Shoulder ER 2# 20x Prone B Scapular retraction with GH extension to netural (still requires TC with every rep to perform any scapular retraction) Standing Hand on wall shoulder flexion slide with lift-off at top 10x Standing hand on wall shoulder flexion slide 2# 10x Standing Hand on ball on wall CW/CCW 20x each               PT Short Term Goals - 01/31/16 0759    PT SHORT TERM GOAL #1   Title L shoulder PROM Flexion to 120, ER to 60 at 90 ABD, and IR to 45 at 90 ABD by 01/18/16   Status Achieved           PT Long Term Goals - 01/31/16 0759    PT LONG TERM GOAL #1   Title L Shoulder AROM WFL all planes without limit by pain by 02/29/16   Status On-going   PT LONG TERM GOAL #2   Title L Shoulder MMT 4/5 or better all planes by 02/29/16   Status On-going   PT LONG TERM GOAL #3   Title pt able to return to all ADLs, chores, and work duties without limitation by shoulder pain, LOM, or weakness by 02/29/16   Status On-going   PT LONG TERM GOAL #4   Title pt able to sleep without limitation by shoulder pain by 02/29/16   Status On-going               Plan - 02/14/16 0745    Clinical Impression Statement L GH PROM is basically [facility] all planes but still with 6/10 pain and Flexion AROM same as last assessment at 122 degrees.  Pt is compliant with HEP and tries hard with treatments but just not progressing with function and pain lately.  His scapulohumeral rhythm is still impaired and doesn't seem to be improving despite frequent TC and VC.  Pt to MD next week.   PT Next Visit Plan continue to work on scapular mechanics and pec stretching; manual and modalities for pain PRN   Consulted and Agree with Plan of Care Patient        Problem List Patient Active Problem List   Diagnosis Date Noted  . Complete rotator cuff tear of left shoulder 07/05/2015  .  Tennis elbow 05/30/2014  . Rotator cuff (capsule) sprain 03/10/2014  . Right shoulder pain 02/11/2014  . Memory difficulty 12/18/2012  . Gynecomastia, male 07/30/2012  . OSA (obstructive sleep apnea) 04/23/2011  . EXTERNAL HEMORRHOIDS 02/12/2011  . Prostate cancer ([place]) 11/12/2010  . PLANTAR FASCIITIS 04/09/2010  . Hypothyroidism 12/08/2009  . NEOPLASM OF UNCERTAIN BEHAVIOR OF SKIN 03/10/2009  . OSTEOARTHRITIS 02/14/2009  . HYPERLIPIDEMIA 02/08/2009  .  HEARING DEFICIT 02/08/2009  . Essential hypertension 02/08/2009  . ALLERGIC RHINITIS 02/08/2009  . GERD 02/08/2009  . PROTEINURIA 02/08/2009  . DIVERTICULITIS, HX OF 02/08/2009    Sweetie Giebler PT, OCS 02/14/2016, 8:05 AM  [facility] [place], [place], [address] Phone: [phone]   Fax:  [phone]  Name: Zachary [name] MRN: [id number] Date of Birth: Apr 14, 1951

## 2016-02-19 ENCOUNTER — Ambulatory Visit: Payer: 59 | Admitting: Physical Therapy

## 2016-02-19 DIAGNOSIS — R29898 Other symptoms and signs involving the musculoskeletal system: Secondary | ICD-10-CM | POA: Diagnosis not present

## 2016-02-19 DIAGNOSIS — M25512 Pain in left shoulder: Secondary | ICD-10-CM

## 2016-02-19 DIAGNOSIS — M25612 Stiffness of left shoulder, not elsewhere classified: Secondary | ICD-10-CM

## 2016-02-19 NOTE — Therapy (Signed)
[facility] [address]  [place], [place], [address] Phone: [phone]   Fax:  [phone]  Physical Therapy Treatment  Patient Details  Name: Zachary [name] MRN: [id number] Date of Birth: 01-11-1951 Referring Provider: Kathryne Hitch  Encounter Date: 02/19/2016      PT End of Session - 02/19/16 0720    Visit Number 17   Number of Visits 25   Date for PT Re-Evaluation 03/18/16   PT Start Time [id number]   PT Stop Time 0759   PT Time Calculation (min) 42 min      Past Medical History  Diagnosis Date  . Hyperlipidemia   . Hypertension   . Insomnia   . Hypothyroidism   . GERD (gastroesophageal reflux disease)   . Arthritis   . Hemorrhoids   . Hearing problem     hearing deficit  . Diverticulitis   . Wears glasses   . Wears hearing aid     both ears  . Cancer ([place]) 2015    positive prostate cancer bx  . Sleep apnea     uses a cpap  . Articular cartilage disease     left shoulder    Past Surgical History  Procedure Laterality Date  . Hemorrhoid surgery  08/2006  . Back surgery  1978    herniated disksurgery  . Tendon repair  June 06, 2011    right elbow, Dr. Percell Miller  . Cardiac catheterization  08/01/2010  . Colonoscopy    . Shoulder arthroscopy with subacromial decompression, rotator cuff repair and bicep tendon repair Right 03/10/2014    Procedure: RIGHT SHOULDER ARTHROSCOPY WITH SUBACROMIAL DECOMPRESSION, PARTIAL ACROMIOPLASTY WITH CORACOAROMIAL LABRUM DEBRIDEMENT RELEASE DISTAL CLAVICULECTOMY,  ROTATOR CUFF REPAIR AND EXTENSIVE DEBRIDEMENT;  Surgeon: Ninetta Lights, MD;  Location: [facility];  Service: Orthopedics;  Laterality: Right;  . Exam under anesthesia with manipulation of shoulder Right 09/15/2014    Procedure: RIGHT SHOULDER MANIPULATION UNDER ANESTHESIA;  Surgeon: Ninetta Lights, MD;  Location: [place] [facility];  Service: Orthopedics;  Laterality: Right;  .  Shoulder arthroscopy with rotator cuff repair and subacromial decompression Left 07/07/2015    Procedure: LEFT SHOULDER SCOPE DEBRIDEMENT, SUBACROMIAL DECOMPRESSION, DISTAL CLAVICULECTOMY, ROTATOR CUFF REPAIR  ;  Surgeon: Kathryne Hitch, MD;  Location: [facility];  Service: Orthopedics;  Laterality: Left;  ANESTHESIA: GENERAL, PRE/POST OP SCALENE  . Shoulder arthroscopy with distal clavicle resection Left 07/07/2015    Procedure: SHOULDER ARTHROSCOPY WITH DISTAL CLAVICLE RESECTION;  Surgeon: Kathryne Hitch, MD;  Location: [facility];  Service: Orthopedics;  Laterality: Left;  . Shoulder arthroscopy with rotator cuff repair Left 12/07/2015    Procedure: LEFT SHOULDER ARTHROSCOPY DEBRIDEMENT, WITH ROTATOR CUFF REPAIR;  Surgeon: Ninetta Lights, MD;  Location: [facility];  Service: Orthopedics;  Laterality: Left;    There were no vitals filed for this visit.  Visit Diagnosis:  Left shoulder pain  Shoulder weakness  Shoulder stiffness, left      Subjective Assessment - 02/19/16 0720    Subjective No change; states notes pain with dressing, bathing, and all reaching activities.  Shoulder pain continues to limit quality of sleep stating rolling over or moving L UE wakes him. He states his pain is worse than prior to surgery.   Currently in Pain? Yes   Pain Score 6    Pain Location Shoulder   Pain Orientation Left  [facility] PT Assessment - 02/19/16 0001    AROM   Left Shoulder Flexion 102 Degrees   PROM   Left Shoulder Flexion 156 Degrees   Left Shoulder Internal Rotation 70 Degrees  80 ABD   Left Shoulder External Rotation 70 Degrees  80 ABD      TODAY'S TREATMENT Manual - Grade 2 AP and Caudal glides with gentle stretching into Flexion, ER, and IR  TherEx - UBE lvl 2.0 60"/60" Hooklying Wand Flexion AAROM 20x Hooklying Wand Horiz ABD/ADD 15x R Side-Lying L Shoulder ER AROM 20x Standing B Scap Retraction with Shoulder  Extension to Neutral Red TB 2x(10x5") Counter Push-up Plus 10x Standing IR Red TB 20x R Side-Lying L ER 1#   L Shoulder PROM and AROM assessment           PT Short Term Goals - 01/31/16 0759    PT SHORT TERM GOAL #1   Title L shoulder PROM Flexion to 120, ER to 60 at 90 ABD, and IR to 45 at 90 ABD by 01/18/16   Status Achieved           PT Long Term Goals - 02/19/16 1155    PT LONG TERM GOAL #1   Title L Shoulder AROM WFL all planes without limit by pain by 03/18/16   Status On-going   PT LONG TERM GOAL #2   Title L Shoulder MMT 4/5 or better all planes by 03/18/16   Status On-going   PT LONG TERM GOAL #3   Title pt able to return to all ADLs, chores, and work duties without limitation by shoulder pain, LOM, or weakness by 03/18/16   Status On-going   PT LONG TERM GOAL #4   Title pt able to sleep without limitation by shoulder pain by 03/18/16   Status On-going               Plan - 02/19/16 0758    Clinical Impression Statement Zachary [name] is 10.5 wks s/p L RCR.  POC was limited to PROM and some AAROM for first 4 weeks, sling was discharged by MD on 01/16/16, and since that time PT POC has progressedat rate approx 1-2 wks behind RCR protocol.  Despite this, pt very frustrated stating shoulder feels worse than prior to surgery regarding pain. He states his pain has been 6/10 typically lately and c/o difficulty sleeping, dressing, bathing, and reaching due to pain.  L Shoulder PROM is quite good but Flexion AROM limited (Flexion is the only motion measured in standard position and was 102 today and best to date was 122).  Side-Lying L Shoulder ER is basically full and no c/o increased pain.  ABD AROM not assessed or performed throughout treatment to date.  Treatments currently focus on scapular mechanics, RC strengthening with neutral shoulder to tolerance, AAROM and some AROM activities.  His progress has slowed and is chiefly limited by pain since last MD f/u on 01/16/16. He has 3  visits remaining on current POC but he will not meet rehab goals by then.  I would like to extend PT POC an additional 4 weeks with hope that his pain is due to something other than re-injury.   Pt will benefit from skilled therapeutic intervention in order to improve on the following deficits Pain;Postural dysfunction;Decreased strength;Decreased mobility;Improper body mechanics;Decreased range of motion   Rehab Potential Fair   PT Frequency 2x / week   PT Duration 4 weeks   PT Treatment/Interventions Manual techniques;Therapeutic exercise;Therapeutic activities;Patient/family education;Vasopneumatic Device;Taping;Electrical  Stimulation;Cryotherapy   PT Next Visit Plan continue to work on scapular mechanics, RC stengthening to tolerance; AAROM and AROM to tolerance; manual and modalities for pain PRN   Consulted and Agree with Plan of Care Patient        Problem List Patient Active Problem List   Diagnosis Date Noted  . Complete rotator cuff tear of left shoulder 07/05/2015  . Tennis elbow 05/30/2014  . Rotator cuff (capsule) sprain 03/10/2014  . Right shoulder pain 02/11/2014  . Memory difficulty 12/18/2012  . Gynecomastia, male 07/30/2012  . OSA (obstructive sleep apnea) 04/23/2011  . EXTERNAL HEMORRHOIDS 02/12/2011  . Prostate cancer ([place]) 11/12/2010  . PLANTAR FASCIITIS 04/09/2010  . Hypothyroidism 12/08/2009  . NEOPLASM OF UNCERTAIN BEHAVIOR OF SKIN 03/10/2009  . OSTEOARTHRITIS 02/14/2009  . HYPERLIPIDEMIA 02/08/2009  . HEARING DEFICIT 02/08/2009  . Essential hypertension 02/08/2009  . ALLERGIC RHINITIS 02/08/2009  . GERD 02/08/2009  . PROTEINURIA 02/08/2009  . DIVERTICULITIS, HX OF 02/08/2009    Zachary [name] PT, OCS 02/19/2016, 11:56 AM  [facility] [address]  [place], [place], [address] Phone: [phone]   Fax:  [phone]  Name: Zachary [name] MRN: [id number] Date of Birth:  19-Apr-1951

## 2016-02-20 DIAGNOSIS — S46012D Strain of muscle(s) and tendon(s) of the rotator cuff of left shoulder, subsequent encounter: Secondary | ICD-10-CM | POA: Diagnosis not present

## 2016-02-20 MED FILL — HYDROmorphone HCL 2 MG TABS: 2 | 30 days supply | Qty: 60 | Fill #0

## 2016-02-21 ENCOUNTER — Ambulatory Visit: Payer: 59 | Attending: Orthopedic Surgery | Admitting: Physical Therapy

## 2016-02-21 DIAGNOSIS — M25512 Pain in left shoulder: Secondary | ICD-10-CM | POA: Insufficient documentation

## 2016-02-21 DIAGNOSIS — M25612 Stiffness of left shoulder, not elsewhere classified: Secondary | ICD-10-CM | POA: Insufficient documentation

## 2016-02-21 DIAGNOSIS — R29898 Other symptoms and signs involving the musculoskeletal system: Secondary | ICD-10-CM | POA: Diagnosis not present

## 2016-02-21 NOTE — Therapy (Signed)
[facility] [address]  [place], [place], [address] Phone: [phone]   Fax:  [phone]  Physical Therapy Treatment  Patient Details  Name: Zachary [name] MRN: [id number] Date of Birth: Jul 14, 1951 Referring Provider: Kathryne Hitch  Encounter Date: 02/21/2016      PT End of Session - 02/21/16 0717    Visit Number 18   Number of Visits 25   Date for PT Re-Evaluation 03/18/16   PT Start Time 0715   PT Stop Time 0759   PT Time Calculation (min) 44 min      Past Medical History  Diagnosis Date  . Hyperlipidemia   . Hypertension   . Insomnia   . Hypothyroidism   . GERD (gastroesophageal reflux disease)   . Arthritis   . Hemorrhoids   . Hearing problem     hearing deficit  . Diverticulitis   . Wears glasses   . Wears hearing aid     both ears  . Cancer ([place]) 2015    positive prostate cancer bx  . Sleep apnea     uses a cpap  . Articular cartilage disease     left shoulder    Past Surgical History  Procedure Laterality Date  . Hemorrhoid surgery  08/2006  . Back surgery  1978    herniated disksurgery  . Tendon repair  June 06, 2011    right elbow, Dr. Percell Miller  . Cardiac catheterization  08/01/2010  . Colonoscopy    . Shoulder arthroscopy with subacromial decompression, rotator cuff repair and bicep tendon repair Right 03/10/2014    Procedure: RIGHT SHOULDER ARTHROSCOPY WITH SUBACROMIAL DECOMPRESSION, PARTIAL ACROMIOPLASTY WITH CORACOAROMIAL LABRUM DEBRIDEMENT RELEASE DISTAL CLAVICULECTOMY,  ROTATOR CUFF REPAIR AND EXTENSIVE DEBRIDEMENT;  Surgeon: Ninetta Lights, MD;  Location: [facility];  Service: Orthopedics;  Laterality: Right;  . Exam under anesthesia with manipulation of shoulder Right 09/15/2014    Procedure: RIGHT SHOULDER MANIPULATION UNDER ANESTHESIA;  Surgeon: Ninetta Lights, MD;  Location: [facility];  Service: Orthopedics;  Laterality: Right;  . Shoulder  arthroscopy with rotator cuff repair and subacromial decompression Left 07/07/2015    Procedure: LEFT SHOULDER SCOPE DEBRIDEMENT, SUBACROMIAL DECOMPRESSION, DISTAL CLAVICULECTOMY, ROTATOR CUFF REPAIR  ;  Surgeon: Kathryne Hitch, MD;  Location: [facility];  Service: Orthopedics;  Laterality: Left;  ANESTHESIA: GENERAL, PRE/POST OP SCALENE  . Shoulder arthroscopy with distal clavicle resection Left 07/07/2015    Procedure: SHOULDER ARTHROSCOPY WITH DISTAL CLAVICLE RESECTION;  Surgeon: Kathryne Hitch, MD;  Location: [facility];  Service: Orthopedics;  Laterality: Left;  . Shoulder arthroscopy with rotator cuff repair Left 12/07/2015    Procedure: LEFT SHOULDER ARTHROSCOPY DEBRIDEMENT, WITH ROTATOR CUFF REPAIR;  Surgeon: Ninetta Lights, MD;  Location: [facility];  Service: Orthopedics;  Laterality: Left;    There were no vitals filed for this visit.  Visit Diagnosis:  Left shoulder pain  Shoulder weakness  Shoulder stiffness, left      Subjective Assessment - 02/21/16 0717    Subjective States MD feels as though current pain and symptoms are hopefully due to inflammation and scarring.  MD also advised him to take his pain meds before going to bed to help with sleep.  He states MD told him to tell PT to "take it up a notch but be cautious".   Currently in Pain? Yes   Pain Score 6    Pain Location Shoulder  Pain Orientation Left            TODAY'S TREATMENT TherEx -  UBE lvl 2.0 90"/90" R Side-Lying L ER 0# 10x, 1# 10x, 2# 10x R Side-Lying L ABD 10-40dg 0# 20x, 1#20x  Manual - L GH distraction with pt performing reduction contraction 2x10, grade 2 AP and Caudal glides L GH, Grade 2 AP L ACJ  TherEx - Supine L GH CW/CCW 5# 15x each Supine Chest Press 1# 20x Seated L GH scaption into empty can 2x10 (good ROM, only mild pain, good scapular mechanics for the most part) BodyBlade IR/ER 2x20", ABD/ADD 2x20" (very difficult with mechanics  with ABD/ADD) Chest Press 5# 2x10 Standing B ER 0 ABD Yellow TB 10x then progressed into W with Yellow TB 10x Tricep Pushdown 10#, 15x         PT Short Term Goals - 01/31/16 0759    PT SHORT TERM GOAL #1   Title L shoulder PROM Flexion to 120, ER to 60 at 90 ABD, and IR to 45 at 90 ABD by 01/18/16   Status Achieved           PT Long Term Goals - 02/19/16 1155    PT LONG TERM GOAL #1   Title L Shoulder AROM WFL all planes without limit by pain by 03/18/16   Status On-going   PT LONG TERM GOAL #2   Title L Shoulder MMT 4/5 or better all planes by 03/18/16   Status On-going   PT LONG TERM GOAL #3   Title pt able to return to all ADLs, chores, and work duties without limitation by shoulder pain, LOM, or weakness by 03/18/16   Status On-going   PT LONG TERM GOAL #4   Title pt able to sleep without limitation by shoulder pain by 03/18/16   Status On-going               Plan - 02/21/16 0755    Clinical Impression Statement MD believes chief issue is scarring and inflammation to L shoulder and advised [name] to take meds more regularly to assist with pain.  Today's treatment focused on some increased intensity with RC training and seemed very well tolerated.  Will continue with this focus avoiding impingement or excessive stretch/strain to [facility] as able.   PT Next Visit Plan continue to work on scapular mechanics, RC stengthening to tolerance; AAROM and AROM to tolerance; manual and modalities for pain PRN   Consulted and Agree with Plan of Care Patient        Problem List Patient Active Problem List   Diagnosis Date Noted  . Complete rotator cuff tear of left shoulder 07/05/2015  . Tennis elbow 05/30/2014  . Rotator cuff (capsule) sprain 03/10/2014  . Right shoulder pain 02/11/2014  . Memory difficulty 12/18/2012  . Gynecomastia, male 07/30/2012  . OSA (obstructive sleep apnea) 04/23/2011  . EXTERNAL HEMORRHOIDS 02/12/2011  . Prostate cancer ([place]) 11/12/2010  . PLANTAR  FASCIITIS 04/09/2010  . Hypothyroidism 12/08/2009  . NEOPLASM OF UNCERTAIN BEHAVIOR OF SKIN 03/10/2009  . OSTEOARTHRITIS 02/14/2009  . HYPERLIPIDEMIA 02/08/2009  . HEARING DEFICIT 02/08/2009  . Essential hypertension 02/08/2009  . ALLERGIC RHINITIS 02/08/2009  . GERD 02/08/2009  . PROTEINURIA 02/08/2009  . DIVERTICULITIS, HX OF 02/08/2009    Brinden Kincheloe PT, OCS 02/21/2016, 8:00 AM  [facility] [address]  [place], [place], [address] Phone: [phone]   Fax:  [phone]  Name: Ames Stanfield  Ibbotson MRN: [id number] Date of Birth: [birth year]

## 2016-02-27 ENCOUNTER — Ambulatory Visit: Payer: 59 | Admitting: Physical Therapy

## 2016-02-27 DIAGNOSIS — M25612 Stiffness of left shoulder, not elsewhere classified: Secondary | ICD-10-CM

## 2016-02-27 DIAGNOSIS — R29898 Other symptoms and signs involving the musculoskeletal system: Secondary | ICD-10-CM

## 2016-02-27 DIAGNOSIS — M25512 Pain in left shoulder: Secondary | ICD-10-CM | POA: Diagnosis not present

## 2016-02-27 NOTE — Therapy (Signed)
[facility] [address]  [place], [place], [address] Phone: [phone]   Fax:  [phone]  Physical Therapy Treatment  Patient Details  Name: Zachary [name] MRN: [id number] Date of Birth: December 28, 1950 Referring Provider: Kathryne Hitch  Encounter Date: 02/27/2016      PT End of Session - 02/27/16 1448    Visit Number 19   Number of Visits 25   Date for PT Re-Evaluation 03/18/16   PT Start Time [id number]   PT Stop Time 1532   PT Time Calculation (min) 47 min      Past Medical History  Diagnosis Date  . Hyperlipidemia   . Hypertension   . Insomnia   . Hypothyroidism   . GERD (gastroesophageal reflux disease)   . Arthritis   . Hemorrhoids   . Hearing problem     hearing deficit  . Diverticulitis   . Wears glasses   . Wears hearing aid     both ears  . Cancer ([place]) 2015    positive prostate cancer bx  . Sleep apnea     uses a cpap  . Articular cartilage disease     left shoulder    Past Surgical History  Procedure Laterality Date  . Hemorrhoid surgery  08/2006  . Back surgery  1978    herniated disksurgery  . Tendon repair  June 06, 2011    right elbow, Dr. Percell Miller  . Cardiac catheterization  08/01/2010  . Colonoscopy    . Shoulder arthroscopy with subacromial decompression, rotator cuff repair and bicep tendon repair Right 03/10/2014    Procedure: RIGHT SHOULDER ARTHROSCOPY WITH SUBACROMIAL DECOMPRESSION, PARTIAL ACROMIOPLASTY WITH CORACOAROMIAL LABRUM DEBRIDEMENT RELEASE DISTAL CLAVICULECTOMY,  ROTATOR CUFF REPAIR AND EXTENSIVE DEBRIDEMENT;  Surgeon: Ninetta Lights, MD;  Location: [facility];  Service: Orthopedics;  Laterality: Right;  . Exam under anesthesia with manipulation of shoulder Right 09/15/2014    Procedure: RIGHT SHOULDER MANIPULATION UNDER ANESTHESIA;  Surgeon: Ninetta Lights, MD;  Location: [facility];  Service: Orthopedics;  Laterality: Right;  . Shoulder  arthroscopy with rotator cuff repair and subacromial decompression Left 07/07/2015    Procedure: LEFT SHOULDER SCOPE DEBRIDEMENT, SUBACROMIAL DECOMPRESSION, DISTAL CLAVICULECTOMY, ROTATOR CUFF REPAIR  ;  Surgeon: Kathryne Hitch, MD;  Location: [facility];  Service: Orthopedics;  Laterality: Left;  ANESTHESIA: GENERAL, PRE/POST OP SCALENE  . Shoulder arthroscopy with distal clavicle resection Left 07/07/2015    Procedure: SHOULDER ARTHROSCOPY WITH DISTAL CLAVICLE RESECTION;  Surgeon: Kathryne Hitch, MD;  Location: [facility];  Service: Orthopedics;  Laterality: Left;  . Shoulder arthroscopy with rotator cuff repair Left 12/07/2015    Procedure: LEFT SHOULDER ARTHROSCOPY DEBRIDEMENT, WITH ROTATOR CUFF REPAIR;  Surgeon: Ninetta Lights, MD;  Location: [facility];  Service: Orthopedics;  Laterality: Left;    There were no vitals filed for this visit.  Visit Diagnosis:  Left shoulder pain  Shoulder weakness  Shoulder stiffness, left      Subjective Assessment - 02/27/16 1448    Subjective "sore, sore, sore; not getting better". No longer wearing sling at work but is not using L UE.  States only able to sleep 1-2 hours at a time and has been up since 2AM.   Currently in Pain? Yes   Pain Score --  6-7/10   Pain Location Shoulder   Pain Orientation Left          TODAY'S TREATMENT TherEx -  UBE lvl 2.0 90"/90" R Side-Lying L ABD 10-40dg 0# 20x, 1#20x R Side-Lying L ER 1# 20x, 2# 10x R Side-Lying CW/CCW in 90 ABD (slight scaption) 20x  Manual - L GH distraction with pt performing reduction contraction 2x10, grade 2 AP and Caudal glides L GH, Grade 2 AP L ACJ  TherEx - Seated L GH scaption into empty can 0# 2x10 (attempted with 1# but pain and unable to lift with good ROM) BodyBlade IR/ER 3x20" (unable to perform ABD/ADD today due to pain) Standing B ER 0 ABD Yellow TB 15x W with Yellow TB 15x   [place] - 20%, 5cm, 0.5W/cm2, 1.0MHz, 8' lateral  L shoulder (subacromial space)           PT Short Term Goals - 01/31/16 0759    PT SHORT TERM GOAL #1   Title L shoulder PROM Flexion to 120, ER to 60 at 90 ABD, and IR to 45 at 90 ABD by 01/18/16   Status Achieved           PT Long Term Goals - 02/19/16 1155    PT LONG TERM GOAL #1   Title L Shoulder AROM WFL all planes without limit by pain by 03/18/16   Status On-going   PT LONG TERM GOAL #2   Title L Shoulder MMT 4/5 or better all planes by 03/18/16   Status On-going   PT LONG TERM GOAL #3   Title pt able to return to all ADLs, chores, and work duties without limitation by shoulder pain, LOM, or weakness by 03/18/16   Status On-going   PT LONG TERM GOAL #4   Title pt able to sleep without limitation by shoulder pain by 03/18/16   Status On-going               Plan - 02/27/16 1627    Clinical Impression Statement no subjective improvement; tried [place] to see if can help with pain although unlikely.  I offered taping to shoulder but pt denies benefit with this.  Pt also declines ice in clinic stating he performs at home.  No significant benefit with any PT intervention to date for pain reduction.  Treatments will address pain as possible but chief emphasis will be on improving function as tolerated.  Will not allow pt to push into increased pain with PT treatments.   PT Next Visit Plan continue to work on scapular mechanics, RC stengthening to tolerance; AAROM and AROM to tolerance; manual and modalities for pain PRN   Consulted and Agree with Plan of Care Patient        Problem List Patient Active Problem List   Diagnosis Date Noted  . Complete rotator cuff tear of left shoulder 07/05/2015  . Tennis elbow 05/30/2014  . Rotator cuff (capsule) sprain 03/10/2014  . Right shoulder pain 02/11/2014  . Memory difficulty 12/18/2012  . Gynecomastia, male 07/30/2012  . OSA (obstructive sleep apnea) 04/23/2011  . EXTERNAL HEMORRHOIDS 02/12/2011  . Prostate cancer ([place])  11/12/2010  . PLANTAR FASCIITIS 04/09/2010  . Hypothyroidism 12/08/2009  . NEOPLASM OF UNCERTAIN BEHAVIOR OF SKIN 03/10/2009  . OSTEOARTHRITIS 02/14/2009  . HYPERLIPIDEMIA 02/08/2009  . HEARING DEFICIT 02/08/2009  . Essential hypertension 02/08/2009  . ALLERGIC RHINITIS 02/08/2009  . GERD 02/08/2009  . PROTEINURIA 02/08/2009  . DIVERTICULITIS, HX OF 02/08/2009    Takumi Din PT, OCS 02/27/2016, 6:21 PM  [facility] [address]  [address] [place], [place], [address] Phone: [phone]  Fax:  [phone]  Name: ISAC FLORENTINE MRN: [id number] Date of Birth: 08/30/1951

## 2016-02-29 ENCOUNTER — Ambulatory Visit: Payer: 59 | Admitting: Physical Therapy

## 2016-02-29 DIAGNOSIS — M25512 Pain in left shoulder: Secondary | ICD-10-CM | POA: Diagnosis not present

## 2016-02-29 DIAGNOSIS — R29898 Other symptoms and signs involving the musculoskeletal system: Secondary | ICD-10-CM

## 2016-02-29 DIAGNOSIS — M25612 Stiffness of left shoulder, not elsewhere classified: Secondary | ICD-10-CM

## 2016-02-29 NOTE — Therapy (Addendum)
[facility] [address]  [place], [place], [address] Phone: [phone]   Fax:  [phone]  Physical Therapy Treatment  Patient Details  Name: Zachary [name] MRN: [id number] Date of Birth: Jan 02, 1951 Referring Provider: Kathryne Hitch  Encounter Date: 02/29/2016      PT End of Session - 02/29/16 1108    Visit Number 20   Number of Visits 25   Date for PT Re-Evaluation 03/18/16   PT Start Time 1106   PT Stop Time 1157   PT Time Calculation (min) 51 min      Past Medical History  Diagnosis Date  . Hyperlipidemia   . Hypertension   . Insomnia   . Hypothyroidism   . GERD (gastroesophageal reflux disease)   . Arthritis   . Hemorrhoids   . Hearing problem     hearing deficit  . Diverticulitis   . Wears glasses   . Wears hearing aid     both ears  . Cancer ([facility]) 2015    positive prostate cancer bx  . Sleep apnea     uses a cpap  . Articular cartilage disease     left shoulder    Past Surgical History  Procedure Laterality Date  . Hemorrhoid surgery  08/2006  . Back surgery  1978    herniated disksurgery  . Tendon repair  June 06, 2011    right elbow, Dr. Percell Miller  . Cardiac catheterization  08/01/2010  . Colonoscopy    . Shoulder arthroscopy with subacromial decompression, rotator cuff repair and bicep tendon repair Right 03/10/2014    Procedure: RIGHT SHOULDER ARTHROSCOPY WITH SUBACROMIAL DECOMPRESSION, PARTIAL ACROMIOPLASTY WITH CORACOAROMIAL LABRUM DEBRIDEMENT RELEASE DISTAL CLAVICULECTOMY,  ROTATOR CUFF REPAIR AND EXTENSIVE DEBRIDEMENT;  Surgeon: Ninetta Lights, MD;  Location: [facility];  Service: Orthopedics;  Laterality: Right;  . Exam under anesthesia with manipulation of shoulder Right 09/15/2014    Procedure: RIGHT SHOULDER MANIPULATION UNDER ANESTHESIA;  Surgeon: Ninetta Lights, MD;  Location: [facility];  Service: Orthopedics;  Laterality: Right;  . Shoulder  arthroscopy with rotator cuff repair and subacromial decompression Left 07/07/2015    Procedure: LEFT SHOULDER SCOPE DEBRIDEMENT, SUBACROMIAL DECOMPRESSION, DISTAL CLAVICULECTOMY, ROTATOR CUFF REPAIR  ;  Surgeon: Kathryne Hitch, MD;  Location: [facility];  Service: Orthopedics;  Laterality: Left;  ANESTHESIA: GENERAL, PRE/POST OP SCALENE  . Shoulder arthroscopy with distal clavicle resection Left 07/07/2015    Procedure: SHOULDER ARTHROSCOPY WITH DISTAL CLAVICLE RESECTION;  Surgeon: Kathryne Hitch, MD;  Location: [facility];  Service: Orthopedics;  Laterality: Left;  . Shoulder arthroscopy with rotator cuff repair Left 12/07/2015    Procedure: LEFT SHOULDER ARTHROSCOPY DEBRIDEMENT, WITH ROTATOR CUFF REPAIR;  Surgeon: Ninetta Lights, MD;  Location: [facility];  Service: Orthopedics;  Laterality: Left;    There were no vitals filed for this visit.  Visit Diagnosis:  Left shoulder pain  Shoulder weakness  Shoulder stiffness, left      Subjective Assessment - 02/29/16 1109    Subjective Did not take pain meds last night and states couldn't sleep due to pain "up to 9.5/10".     Currently in Pain? Yes   Pain Score 7    Pain Location Shoulder   Pain Orientation Left        TODAY'S TREATMENT TherEx - B Scap retraction with Shoulder Ext Yellow TB 20x3" L ER Yellow TB 20x L IR Red TB 20x  Hand on Wall CW/CCW 20x each (attempted with 1# cuff on wrist but unable to perform due to pain) BodyBlade IR/ER 3x30"  Seated L GH scaption into empty can 0# 15x R Side-Lying L ABD AROM 10x R Side-Lying L ER 2# 20x R Side-Lying L ABD 10-40dg 2#20x Supine Pullover 5# 15x  Manual - L GH distraction with pt performing reduction contraction 10x, grade 2 AP and Caudal glides L GH, DFM subacromial space  Vasopneumatic compression - L Shoulder, Low Pressure, 40 dg, 15'            PT Short Term Goals - 01/31/16 0759    PT SHORT TERM GOAL #1   Title L  shoulder PROM Flexion to 120, ER to 60 at 90 ABD, and IR to 45 at 90 ABD by 01/18/16   Status Achieved           PT Long Term Goals - 02/19/16 1155    PT LONG TERM GOAL #1   Title L Shoulder AROM WFL all planes without limit by pain by 03/18/16   Status On-going   PT LONG TERM GOAL #2   Title L Shoulder MMT 4/5 or better all planes by 03/18/16   Status On-going   PT LONG TERM GOAL #3   Title pt able to return to all ADLs, chores, and work duties without limitation by shoulder pain, LOM, or weakness by 03/18/16   Status On-going   PT LONG TERM GOAL #4   Title pt able to sleep without limitation by shoulder pain by 03/18/16   Status On-going               Plan - 02/29/16 1144    Clinical Impression Statement pt more and more frustrated with lack of progress regarding L shoulder pain.  Keeping exercises in clinic focusing on RC strengthening as able without increased pain.  Added some DFM to area of tenderness at supraspin intertion and pt on vaso/cold today since is returning to work after PT treatment.  With RC active reduction following PT GH distraction, it seems pt with good RC contraction to include supraspinatus so RC seems at least mostly intact but hard to say considering his pain and limited functional mobility.   PT Next Visit Plan continue to work on scapular mechanics, RC stengthening to tolerance; AAROM and AROM to tolerance; manual and modalities for pain PRN   Consulted and Agree with Plan of Care Patient        Problem List Patient Active Problem List   Diagnosis Date Noted  . Complete rotator cuff tear of left shoulder 07/05/2015  . Tennis elbow 05/30/2014  . Rotator cuff (capsule) sprain 03/10/2014  . Right shoulder pain 02/11/2014  . Memory difficulty 12/18/2012  . Gynecomastia, male 07/30/2012  . OSA (obstructive sleep apnea) 04/23/2011  . EXTERNAL HEMORRHOIDS 02/12/2011  . Prostate cancer ([facility]) 11/12/2010  . PLANTAR FASCIITIS 04/09/2010  .  Hypothyroidism 12/08/2009  . NEOPLASM OF UNCERTAIN BEHAVIOR OF SKIN 03/10/2009  . OSTEOARTHRITIS 02/14/2009  . HYPERLIPIDEMIA 02/08/2009  . HEARING DEFICIT 02/08/2009  . Essential hypertension 02/08/2009  . ALLERGIC RHINITIS 02/08/2009  . GERD 02/08/2009  . PROTEINURIA 02/08/2009  . DIVERTICULITIS, HX OF 02/08/2009    Addis Bennie PT, OCS 02/29/2016, 12:04 PM  [facility] [address]  [place], [place], [address] Phone: [phone]   Fax:  [phone]  Name: Zachary [name] MRN: [id number] Date of Birth: 12/30/1950    PHYSICAL  THERAPY DISCHARGE SUMMARY  Visits from Start of Care: 20  Current functional level related to goals / functional outcomes: Zachary [name] was last seen on 02/29/16.  He was not progressing well and returned to MD to find he has re-injured his RC. He is currently pending ortho consult and is being discharged from our care.   Remaining deficits: Left shoulder pain, weakness, LOM, limited function.  Plan: Patient agrees to discharge.  Patient goals were not met. Patient is being discharged due to a change in medical status.  ?????        Zachary [name] PT, OCS 04/17/2016 11:39 AM

## 2016-03-05 ENCOUNTER — Other Ambulatory Visit (HOSPITAL_COMMUNITY): Payer: Self-pay | Admitting: Orthopedic Surgery

## 2016-03-05 ENCOUNTER — Ambulatory Visit: Payer: 59 | Admitting: Physical Therapy

## 2016-03-05 DIAGNOSIS — M25512 Pain in left shoulder: Secondary | ICD-10-CM

## 2016-03-05 DIAGNOSIS — S46012D Strain of muscle(s) and tendon(s) of the rotator cuff of left shoulder, subsequent encounter: Secondary | ICD-10-CM | POA: Diagnosis not present

## 2016-03-05 DIAGNOSIS — C61 Malignant neoplasm of prostate: Secondary | ICD-10-CM | POA: Diagnosis not present

## 2016-03-06 ENCOUNTER — Other Ambulatory Visit: Payer: Self-pay | Admitting: Family Medicine

## 2016-03-06 MED FILL — PRAVASTATIN NA 40 MG TAB: 40 | 90 days supply | Qty: 45 | Fill #2

## 2016-03-06 MED FILL — LISINOPRIL 5 MG TABLET: 5 | 30 days supply | Qty: 30 | Fill #0

## 2016-03-08 ENCOUNTER — Ambulatory Visit: Payer: 59 | Admitting: Physical Therapy

## 2016-03-11 ENCOUNTER — Ambulatory Visit (HOSPITAL_COMMUNITY)
Admission: RE | Admit: 2016-03-11 | Discharge: 2016-03-11 | Disposition: A | Discharge status: Home / Self Care | Payer: 59 | Source: Ambulatory Visit | Attending: Orthopedic Surgery | Admitting: Orthopedic Surgery

## 2016-03-11 ENCOUNTER — Other Ambulatory Visit (HOSPITAL_COMMUNITY): Payer: Self-pay | Admitting: Orthopedic Surgery

## 2016-03-11 DIAGNOSIS — M75122 Complete rotator cuff tear or rupture of left shoulder, not specified as traumatic: Secondary | ICD-10-CM | POA: Diagnosis not present

## 2016-03-11 DIAGNOSIS — Z9889 Other specified postprocedural states: Secondary | ICD-10-CM | POA: Insufficient documentation

## 2016-03-11 DIAGNOSIS — M25512 Pain in left shoulder: Secondary | ICD-10-CM

## 2016-03-11 MED ORDER — GADOBENATE DIMEGLUMINE 529 MG/ML IV SOLN
5.0000 mL | Freq: Once | INTRAVENOUS | Status: AC | PRN
  Administered 2016-03-11: 5 mL via INTRAVENOUS

## 2016-03-11 MED ORDER — IOHEXOL 300 MG/ML  SOLN
50.0000 mL | Freq: Once | INTRAMUSCULAR | Status: AC | PRN
  Administered 2016-03-11: 10 mL via INTRATHECAL

## 2016-03-12 ENCOUNTER — Ambulatory Visit: Payer: 59 | Admitting: Physical Therapy

## 2016-03-13 DIAGNOSIS — C61 Malignant neoplasm of prostate: Secondary | ICD-10-CM | POA: Diagnosis not present

## 2016-03-13 DIAGNOSIS — Z0000 Encounter for general adult medical examination without abnormal findings: Secondary | ICD-10-CM | POA: Diagnosis not present

## 2016-03-13 DIAGNOSIS — N401 Enlarged prostate with lower urinary tract symptoms: Secondary | ICD-10-CM | POA: Diagnosis not present

## 2016-03-13 DIAGNOSIS — R3915 Urgency of urination: Secondary | ICD-10-CM | POA: Diagnosis not present

## 2016-03-13 DIAGNOSIS — N138 Other obstructive and reflux uropathy: Secondary | ICD-10-CM | POA: Diagnosis not present

## 2016-03-15 ENCOUNTER — Ambulatory Visit: Payer: 59 | Admitting: Physical Therapy

## 2016-03-18 ENCOUNTER — Ambulatory Visit: Payer: 59 | Admitting: Physical Therapy

## 2016-03-19 DIAGNOSIS — S46012D Strain of muscle(s) and tendon(s) of the rotator cuff of left shoulder, subsequent encounter: Secondary | ICD-10-CM | POA: Diagnosis not present

## 2016-03-22 ENCOUNTER — Ambulatory Visit (INDEPENDENT_AMBULATORY_CARE_PROVIDER_SITE_OTHER): Payer: 59 | Admitting: Family Medicine

## 2016-03-22 ENCOUNTER — Encounter: Payer: Self-pay | Admitting: Family Medicine

## 2016-03-22 VITALS — BP 125/77 | HR 67 | Wt 207.0 lb

## 2016-03-22 DIAGNOSIS — G4700 Insomnia, unspecified: Secondary | ICD-10-CM

## 2016-03-22 DIAGNOSIS — I10 Essential (primary) hypertension: Secondary | ICD-10-CM | POA: Diagnosis not present

## 2016-03-22 MED ORDER — LISINOPRIL 5 MG PO TABS: 5.0000 mg | ORAL_TABLET | Freq: Every day | ORAL | Status: DC

## 2016-03-22 MED ORDER — ZOLPIDEM TARTRATE 10 MG PO TABS: 5.0000 mg | ORAL_TABLET | Freq: Every evening | ORAL | Status: DC | PRN

## 2016-03-22 MED FILL — ZOLPIDEM TARTRATE 10 MG TAB: 10 | 30 days supply | Qty: 30 | Fill #0

## 2016-03-22 NOTE — Progress Notes (Signed)
CC: Zachary [name] is a 65 y.o. male is here for Medication Refill and Insomnia   Subjective: HPI:  Follow-up of hypertension: Currently taking lisinopril 5 mg on a daily basis. Denies any known side effects. No outside blood pressures to report. No chest pain shortness of breath orthopnea or peripheral edema  He tells me for 2 years now he's had difficulty staying asleep for more than a few hours due to shoulder pain. He'll lie down with pain somewhat under control however. Axillary rolls over on his left shoulder unintentionally immediately wakes up and is in excruciating pain. Interventions currently include 3 mg of pyloric at bedtime, 2 Advil PM, and 2 over-the-counter sleep aids. Symptoms are occurring on a nightly basis and only getting worse. Denies any anxiety or urinary habits that are interfering with sleep.   Review Of Systems Outlined In HPI  Past Medical History  Diagnosis Date  . Hyperlipidemia   . Hypertension   . Insomnia   . Hypothyroidism   . GERD (gastroesophageal reflux disease)   . Arthritis   . Hemorrhoids   . Hearing problem     hearing deficit  . Diverticulitis   . Wears glasses   . Wears hearing aid     both ears  . Cancer ([facility]) 2015    positive prostate cancer bx  . Sleep apnea     uses a cpap  . Articular cartilage disease     left shoulder    Past Surgical History  Procedure Laterality Date  . Hemorrhoid surgery  08/2006  . Back surgery  1978    herniated disksurgery  . Tendon repair  June 06, 2011    right elbow, Dr. Percell Miller  . Cardiac catheterization  08/01/2010  . Colonoscopy    . Shoulder arthroscopy with subacromial decompression, rotator cuff repair and bicep tendon repair Right 03/10/2014    Procedure: RIGHT SHOULDER ARTHROSCOPY WITH SUBACROMIAL DECOMPRESSION, PARTIAL ACROMIOPLASTY WITH CORACOAROMIAL LABRUM DEBRIDEMENT RELEASE DISTAL CLAVICULECTOMY,  ROTATOR CUFF REPAIR AND EXTENSIVE DEBRIDEMENT;  Surgeon: Ninetta Lights, MD;  Location:  [facility];  Service: Orthopedics;  Laterality: Right;  . Exam under anesthesia with manipulation of shoulder Right 09/15/2014    Procedure: RIGHT SHOULDER MANIPULATION UNDER ANESTHESIA;  Surgeon: Ninetta Lights, MD;  Location: [facility];  Service: Orthopedics;  Laterality: Right;  . Shoulder arthroscopy with rotator cuff repair and subacromial decompression Left 07/07/2015    Procedure: LEFT SHOULDER SCOPE DEBRIDEMENT, SUBACROMIAL DECOMPRESSION, DISTAL CLAVICULECTOMY, ROTATOR CUFF REPAIR  ;  Surgeon: Kathryne Hitch, MD;  Location: [facility];  Service: Orthopedics;  Laterality: Left;  ANESTHESIA: GENERAL, PRE/POST OP SCALENE  . Shoulder arthroscopy with distal clavicle resection Left 07/07/2015    Procedure: SHOULDER ARTHROSCOPY WITH DISTAL CLAVICLE RESECTION;  Surgeon: Kathryne Hitch, MD;  Location: [facility];  Service: Orthopedics;  Laterality: Left;  . Shoulder arthroscopy with rotator cuff repair Left 12/07/2015    Procedure: LEFT SHOULDER ARTHROSCOPY DEBRIDEMENT, WITH ROTATOR CUFF REPAIR;  Surgeon: Ninetta Lights, MD;  Location: [facility];  Service: Orthopedics;  Laterality: Left;   Family History  Problem Relation Age of Onset  . Heart disease Father   . Breast cancer Mother   . Cancer Brother   . Alcohol abuse Other   . Arthritis Other   . Cancer Other     Breast, Prostate  . Coronary artery disease Other   . Irritable bowel syndrome Other   . Cystic fibrosis Other  Social History   Social History  . Marital Status: Married    Spouse Name: Zachary [name]  . Number of Children: 2  . 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 Smoking status: Never Smoker   . Smokeless tobacco: Never Used  . Alcohol Use: Yes     Comment: occasional  . Drug Use: No  . Sexual Activity: Not on file   Other Topics Concern  . Not on file    Social History Narrative   Previously Chartered loss adjuster [employer]     Objective: BP 125/77 mmHg  Pulse 67  Wt 207 lb (93.895 kg)  General: Alert and Oriented, No Acute Distress HEENT: Pupils equal, round, reactive to light. Conjunctivae clear.Moist mucous membranes Lungs: Clear to auscultation bilaterally, no wheezing/ronchi/rales.  Comfortable work of breathing. Good air movement. Cardiac: Regular rate and rhythm. Normal S1/S2.  No murmurs, rubs, nor gallops.   Extremities: No peripheral edema.  Strong peripheral pulses.  Mental Status: No depression, anxiety, nor agitation. Skin: Warm and dry.  Assessment & Plan: Zachary [name] was seen today for medication refill and insomnia.  Diagnoses and all orders for this visit:  Essential hypertension -     lisinopril (PRINIVIL,ZESTRIL) 5 MG tablet; Take 1 tablet (5 mg total) by mouth daily.  Insomnia -     zolpidem (AMBIEN) 10 MG tablet; Take 0.5-1 tablets (5-10 mg total) by mouth at bedtime as needed for sleep.   Essential hypertension: Controlled lisinopril Insomnia: Uncontrolled chronic condition adding Ambien in place of his over-the-counter sleep aid.  Return in about 6 months (around 09/21/2016) for BP.

## 2016-03-27 ENCOUNTER — Other Ambulatory Visit: Payer: Self-pay | Admitting: Family Medicine

## 2016-03-27 MED FILL — LEVOTHYROXINE 100 MCG TAB: 100 | 90 days supply | Qty: 90 | Fill #0

## 2016-04-03 MED FILL — SODIUM BICARB 650 MG TABLET: 650 | 30 days supply | Qty: 120 | Fill #2

## 2016-04-10 MED FILL — LISINOPRIL 5 MG TABLET: 5 | 90 days supply | Qty: 90 | Fill #0

## 2016-04-16 DIAGNOSIS — H52223 Regular astigmatism, bilateral: Secondary | ICD-10-CM | POA: Diagnosis not present

## 2016-04-16 DIAGNOSIS — H524 Presbyopia: Secondary | ICD-10-CM | POA: Diagnosis not present

## 2016-04-16 DIAGNOSIS — H5203 Hypermetropia, bilateral: Secondary | ICD-10-CM | POA: Diagnosis not present

## 2016-04-16 MED FILL — ZOLPIDEM TARTRATE 10 MG TAB: 10 | 30 days supply | Qty: 30 | Fill #1

## 2016-04-25 DIAGNOSIS — M25512 Pain in left shoulder: Secondary | ICD-10-CM | POA: Diagnosis not present

## 2016-04-25 DIAGNOSIS — M75102 Unspecified rotator cuff tear or rupture of left shoulder, not specified as traumatic: Secondary | ICD-10-CM | POA: Diagnosis not present

## 2016-04-25 DIAGNOSIS — G8929 Other chronic pain: Secondary | ICD-10-CM | POA: Diagnosis not present

## 2016-05-01 MED FILL — OMEPRAZOLE DR 40 MG CAPSULE: 40 | 90 days supply | Qty: 90 | Fill #3

## 2016-05-09 DIAGNOSIS — Z8546 Personal history of malignant neoplasm of prostate: Secondary | ICD-10-CM | POA: Insufficient documentation

## 2016-05-16 DIAGNOSIS — T85698A Other mechanical complication of other specified internal prosthetic devices, implants and grafts, initial encounter: Secondary | ICD-10-CM | POA: Diagnosis not present

## 2016-05-16 DIAGNOSIS — M7582 Other shoulder lesions, left shoulder: Secondary | ICD-10-CM | POA: Diagnosis not present

## 2016-05-16 DIAGNOSIS — M7542 Impingement syndrome of left shoulder: Secondary | ICD-10-CM | POA: Diagnosis not present

## 2016-05-16 DIAGNOSIS — Z472 Encounter for removal of internal fixation device: Secondary | ICD-10-CM | POA: Diagnosis not present

## 2016-05-16 DIAGNOSIS — M75112 Incomplete rotator cuff tear or rupture of left shoulder, not specified as traumatic: Secondary | ICD-10-CM | POA: Diagnosis not present

## 2016-05-16 DIAGNOSIS — I10 Essential (primary) hypertension: Secondary | ICD-10-CM | POA: Diagnosis not present

## 2016-05-16 DIAGNOSIS — E039 Hypothyroidism, unspecified: Secondary | ICD-10-CM | POA: Diagnosis not present

## 2016-05-16 DIAGNOSIS — M7522 Bicipital tendinitis, left shoulder: Secondary | ICD-10-CM | POA: Diagnosis not present

## 2016-05-16 DIAGNOSIS — T8489XA Other specified complication of internal orthopedic prosthetic devices, implants and grafts, initial encounter: Secondary | ICD-10-CM | POA: Diagnosis not present

## 2016-05-16 DIAGNOSIS — G8918 Other acute postprocedural pain: Secondary | ICD-10-CM | POA: Diagnosis not present

## 2016-05-16 DIAGNOSIS — H9190 Unspecified hearing loss, unspecified ear: Secondary | ICD-10-CM | POA: Diagnosis not present

## 2016-05-16 DIAGNOSIS — M19012 Primary osteoarthritis, left shoulder: Secondary | ICD-10-CM | POA: Diagnosis not present

## 2016-05-16 DIAGNOSIS — M75102 Unspecified rotator cuff tear or rupture of left shoulder, not specified as traumatic: Secondary | ICD-10-CM | POA: Diagnosis not present

## 2016-05-16 DIAGNOSIS — K219 Gastro-esophageal reflux disease without esophagitis: Secondary | ICD-10-CM | POA: Diagnosis not present

## 2016-05-16 MED FILL — HYDROmorphone HCL 2 MG TABS: 2 | 6 days supply | Qty: 75 | Fill #0

## 2016-05-16 MED FILL — ONDANSETRON HCL 4 MG TABLET: 4 | 3 days supply | Qty: 10 | Fill #0

## 2016-05-16 MED FILL — DOK 100 MG SOFTGEL: 100 | 50 days supply | Qty: 100 | Fill #0

## 2016-05-31 DIAGNOSIS — Z9889 Other specified postprocedural states: Secondary | ICD-10-CM | POA: Insufficient documentation

## 2016-06-03 MED FILL — HYDROmorphone HCL 2 MG TABS: 2 | 10 days supply | Qty: 60 | Fill #0

## 2016-06-18 ENCOUNTER — Other Ambulatory Visit: Payer: Self-pay | Admitting: Family Medicine

## 2016-06-18 MED FILL — PRAVASTATIN NA 40 MG TAB: 40 | 90 days supply | Qty: 45 | Fill #0

## 2016-06-27 ENCOUNTER — Ambulatory Visit: Payer: 59 | Attending: Sports Medicine | Admitting: Physical Therapy

## 2016-06-27 DIAGNOSIS — M25612 Stiffness of left shoulder, not elsewhere classified: Secondary | ICD-10-CM | POA: Insufficient documentation

## 2016-06-27 DIAGNOSIS — R29898 Other symptoms and signs involving the musculoskeletal system: Secondary | ICD-10-CM | POA: Insufficient documentation

## 2016-06-27 DIAGNOSIS — M25512 Pain in left shoulder: Secondary | ICD-10-CM | POA: Diagnosis not present

## 2016-06-27 MED FILL — LEVOTHYROXINE 100 MCG TAB: 100 | 90 days supply | Qty: 90 | Fill #1

## 2016-06-27 NOTE — Therapy (Signed)
[facility] [address]  [place], [place], [address] Phone: [phone]   Fax:  [phone]  Physical Therapy Evaluation  Patient Details  Name: Zachary [name] MRN: [id number] Date of Birth: 10-22-1951 Referring Provider: Dr. Corky Mull  Encounter Date: 06/27/2016      PT End of Session - 06/27/16 1251    Visit Number 1   Number of Visits 16   Date for PT Re-Evaluation 08/22/16   PT Start Time 0845   PT Stop Time 0917   PT Time Calculation (min) 32 min   Activity Tolerance Patient tolerated treatment well;No increased pain   Behavior During Therapy [facility] for tasks assessed/performed      Past Medical History  Diagnosis Date  . Hyperlipidemia   . Hypertension   . Insomnia   . Hypothyroidism   . GERD (gastroesophageal reflux disease)   . Arthritis   . Hemorrhoids   . Hearing problem     hearing deficit  . Diverticulitis   . Wears glasses   . Wears hearing aid     both ears  . Cancer ([facility]) 2015    positive prostate cancer bx  . Sleep apnea     uses a cpap  . Articular cartilage disease     left shoulder    Past Surgical History  Procedure Laterality Date  . Hemorrhoid surgery  08/2006  . Back surgery  1978    herniated disksurgery  . Tendon repair  June 06, 2011    right elbow, Dr. Percell Miller  . Cardiac catheterization  08/01/2010  . Colonoscopy    . Shoulder arthroscopy with subacromial decompression, rotator cuff repair and bicep tendon repair Right 03/10/2014    Procedure: RIGHT SHOULDER ARTHROSCOPY WITH SUBACROMIAL DECOMPRESSION, PARTIAL ACROMIOPLASTY WITH CORACOAROMIAL LABRUM DEBRIDEMENT RELEASE DISTAL CLAVICULECTOMY,  ROTATOR CUFF REPAIR AND EXTENSIVE DEBRIDEMENT;  Surgeon: Ninetta Lights, MD;  Location: [facility];  Service: Orthopedics;  Laterality: Right;  . Exam under anesthesia with manipulation of shoulder Right 09/15/2014    Procedure: RIGHT SHOULDER MANIPULATION UNDER  ANESTHESIA;  Surgeon: Ninetta Lights, MD;  Location: [facility];  Service: Orthopedics;  Laterality: Right;  . Shoulder arthroscopy with rotator cuff repair and subacromial decompression Left 07/07/2015    Procedure: LEFT SHOULDER SCOPE DEBRIDEMENT, SUBACROMIAL DECOMPRESSION, DISTAL CLAVICULECTOMY, ROTATOR CUFF REPAIR  ;  Surgeon: Kathryne Hitch, MD;  Location: [facility];  Service: Orthopedics;  Laterality: Left;  ANESTHESIA: GENERAL, PRE/POST OP SCALENE  . Shoulder arthroscopy with distal clavicle resection Left 07/07/2015    Procedure: SHOULDER ARTHROSCOPY WITH DISTAL CLAVICLE RESECTION;  Surgeon: Kathryne Hitch, MD;  Location: [facility];  Service: Orthopedics;  Laterality: Left;  . Shoulder arthroscopy with rotator cuff repair Left 12/07/2015    Procedure: LEFT SHOULDER ARTHROSCOPY DEBRIDEMENT, WITH ROTATOR CUFF REPAIR;  Surgeon: Ninetta Lights, MD;  Location: [facility];  Service: Orthopedics;  Laterality: Left;    There were no vitals filed for this visit.       Subjective Assessment - 06/27/16 0848    Subjective Pt is a 65 y/o male who returns to OPPT s/p open RTC repair on 05/16/16.  This is RTC repair #3 in one year.  Pt returns to rehab and recovery.    Patient is accompained by: Family member   Pertinent History 2 previous RTC repairs   Limitations Lifting;House hold activities   Patient Stated Goals return to work and  full recovery   Currently in Pain? No/denies   Pain Score 0-No pain   Pain Location Shoulder   Pain Orientation Left   Pain Descriptors / Indicators Sharp   Pain Type Surgical pain   Pain Onset More than a month ago   Pain Frequency Intermittent   Aggravating Factors  taking sling off   Pain Relieving Factors quick relief            [facility] PT Assessment - 06/27/16 0850    Assessment   Medical Diagnosis L RTC repair   Referring Provider Dr. Corky Mull   Onset Date/Surgical Date 05/16/16   Next  MD Visit 07/05/16   Prior Therapy OPPT prior to surgery; none since surgery   Precautions   Precautions Shoulder   Type of Shoulder Precautions see protocol   Required Braces or Orthoses Sling   Balance Screen   Has the patient fallen in the past 6 months No   Has the patient had a decrease in activity level because of a fear of falling?  No   Is the patient reluctant to leave their home because of a fear of falling?  No   Prior Function   Level of Independence Independent   Vocation On disability;Full time employment   IT trainer   Leisure woodworking   Cognition   Overall Cognitive Status Within Functional Limits for tasks assessed   Observation/Other Assessments   Focus on Therapeutic Outcomes (FOTO)  29 (71% limited; predicted 42% limited)   Posture/Postural Control   Posture/Postural Control Postural limitations   Postural Limitations Rounded Shoulders;Forward head   PROM   PROM Assessment Site Shoulder   Right/Left Shoulder Left   Left Shoulder Flexion 102 Degrees   Left Shoulder ABduction 71 Degrees  mild shoulder shrug present (measured supine)   Left Shoulder Internal Rotation 60 Degrees  in scapular plane   Left Shoulder External Rotation 18 Degrees  in scapular plane   Strength   Overall Strength Comments strength deferred due to protocol                   [facility] Adult PT Treatment/Exercise - 06/27/16 0915    Modalities   Modalities Vasopneumatic   Vasopneumatic   Number Minutes Vasopneumatic  15 minutes   Vasopnuematic Location  Shoulder   Vasopneumatic Pressure Medium   Vasopneumatic Temperature  max cold   Manual Therapy   Manual Therapy Passive ROM;Joint mobilization   Joint Mobilization gentle grade 1 A/P and inf mobs L shoulder   Passive ROM L shoulder flexion and er in scaption                PT Education - 06/27/16 1250    Education provided Yes   Education Details POC   Person(s)  Educated Patient   Methods Explanation   Comprehension Verbalized understanding          PT Short Term Goals - 06/27/16 1422    PT SHORT TERM GOAL #1   Title L shoulder PROM Flexion to 120, ER to 60 at 90 ABD, and IR to 45 at 90 ABD by 07/25/16   Time 4   Period Weeks   Status New           PT Long Term Goals - 06/27/16 1423    PT LONG TERM GOAL #1   Title Full PROM in order to progress into Phase II rehab (08/22/16)   Time 8   Period Weeks  Status New   PT LONG TERM GOAL #2   Title L shoulder AROM: flexion 120; abduction at least 90; and ir/er at least 60 without substitution for progression into phase II (08/22/16)   Time 8   Period Weeks   Status New   PT LONG TERM GOAL #3   Title n/a   PT LONG TERM GOAL #4   Title n/a               Plan - 06/27/16 1251    Clinical Impression Statement Pt is a 65 y/o male who presents to OPPT for moderate complexity evaluation s/p L RTC repair.  This is his 3rd repair in one year; and current protocol is for PROM only and able to slowly begin AAROM in the next 1-2 weeks.  Evaluation limited to PROM only per protocol and no strength assessment performed today, though anticipate weakness will be present due to atrophy and immobilization.  Pt will benefit from PT to address deficits and return to PLOF.   Rehab Potential Good   Clinical Impairments Affecting Rehab Potential failed RTC repair x 2   PT Frequency 2x / week   PT Duration 8 weeks   PT Treatment/Interventions ADLs/Self Care Home Management;Cryotherapy;Electrical Stimulation;Moist Heat;Ultrasound;Patient/family education;Neuromuscular re-education;Therapeutic exercise;Therapeutic activities;Functional mobility training;Manual techniques;Dry needling;Taping;Passive range of motion;Vasopneumatic Device   PT Next Visit Plan contine PROM and begin AAROM per protocol (gravity minimized and flexion, IR and ER only)      Patient will benefit from skilled therapeutic intervention  in order to improve the following deficits and impairments:  Postural dysfunction, Decreased strength, Impaired UE functional use, Pain, Decreased range of motion, Decreased scar mobility  Visit Diagnosis: Pain in left shoulder - Plan: PT plan of care cert/re-cert  Stiffness of left shoulder, not elsewhere classified - Plan: PT plan of care cert/re-cert     Problem List Patient Active Problem List   Diagnosis Date Noted  . Complete rotator cuff tear of left shoulder 07/05/2015  . Tennis elbow 05/30/2014  . Rotator cuff (capsule) sprain 03/10/2014  . Right shoulder pain 02/11/2014  . Memory difficulty 12/18/2012  . Gynecomastia, male 07/30/2012  . OSA (obstructive sleep apnea) 04/23/2011  . EXTERNAL HEMORRHOIDS 02/12/2011  . Prostate cancer ([facility]) 11/12/2010  . PLANTAR FASCIITIS 04/09/2010  . Hypothyroidism 12/08/2009  . NEOPLASM OF UNCERTAIN BEHAVIOR OF SKIN 03/10/2009  . OSTEOARTHRITIS 02/14/2009  . HYPERLIPIDEMIA 02/08/2009  . HEARING DEFICIT 02/08/2009  . Essential hypertension 02/08/2009  . ALLERGIC RHINITIS 02/08/2009  . GERD 02/08/2009  . PROTEINURIA 02/08/2009  . DIVERTICULITIS, HX OF 02/08/2009   Laureen Abrahams, PT, DPT 06/27/2016 2:28 PM  [facility] [address]  [place], [place], [address] Phone: [phone]   Fax:  [phone]  Name: Zachary [name] MRN: [id number] Date of Birth: 12-Dec-1951

## 2016-07-01 ENCOUNTER — Ambulatory Visit: Payer: 59

## 2016-07-01 DIAGNOSIS — R29898 Other symptoms and signs involving the musculoskeletal system: Secondary | ICD-10-CM | POA: Diagnosis not present

## 2016-07-01 DIAGNOSIS — M25612 Stiffness of left shoulder, not elsewhere classified: Secondary | ICD-10-CM

## 2016-07-01 DIAGNOSIS — M25512 Pain in left shoulder: Secondary | ICD-10-CM | POA: Diagnosis not present

## 2016-07-02 NOTE — Therapy (Signed)
[facility] [address]  [place], [place], [address] Phone: [phone]   Fax:  [phone]  Physical Therapy Treatment  Patient Details  Name: Zachary [name] MRN: [id number] Date of Birth: 01/04/1951 Referring Provider: Dr. Corky Mull  Encounter Date: 07/01/2016      PT End of Session - 07/01/16 0856    Visit Number 2   Number of Visits 16   Date for PT Re-Evaluation 08/22/16   PT Start Time 0849   PT Stop Time 0940   PT Time Calculation (min) 51 min   Activity Tolerance Patient tolerated treatment well;No increased pain   Behavior During Therapy [facility] for tasks assessed/performed      Past Medical History  Diagnosis Date  . Hyperlipidemia   . Hypertension   . Insomnia   . Hypothyroidism   . GERD (gastroesophageal reflux disease)   . Arthritis   . Hemorrhoids   . Hearing problem     hearing deficit  . Diverticulitis   . Wears glasses   . Wears hearing aid     both ears  . Cancer ([place]) 2015    positive prostate cancer bx  . Sleep apnea     uses a cpap  . Articular cartilage disease     left shoulder    Past Surgical History  Procedure Laterality Date  . Hemorrhoid surgery  08/2006  . Back surgery  1978    herniated disksurgery  . Tendon repair  June 06, 2011    right elbow, Dr. Percell Miller  . Cardiac catheterization  08/01/2010  . Colonoscopy    . Shoulder arthroscopy with subacromial decompression, rotator cuff repair and bicep tendon repair Right 03/10/2014    Procedure: RIGHT SHOULDER ARTHROSCOPY WITH SUBACROMIAL DECOMPRESSION, PARTIAL ACROMIOPLASTY WITH CORACOAROMIAL LABRUM DEBRIDEMENT RELEASE DISTAL CLAVICULECTOMY,  ROTATOR CUFF REPAIR AND EXTENSIVE DEBRIDEMENT;  Surgeon: Ninetta Lights, MD;  Location: [place];  Service: Orthopedics;  Laterality: Right;  . Exam under anesthesia with manipulation of shoulder Right 09/15/2014    Procedure: RIGHT SHOULDER MANIPULATION UNDER  ANESTHESIA;  Surgeon: Ninetta Lights, MD;  Location: [place];  Service: Orthopedics;  Laterality: Right;  . Shoulder arthroscopy with rotator cuff repair and subacromial decompression Left 07/07/2015    Procedure: LEFT SHOULDER SCOPE DEBRIDEMENT, SUBACROMIAL DECOMPRESSION, DISTAL CLAVICULECTOMY, ROTATOR CUFF REPAIR  ;  Surgeon: Kathryne Hitch, MD;  Location: [place];  Service: Orthopedics;  Laterality: Left;  ANESTHESIA: GENERAL, PRE/POST OP SCALENE  . Shoulder arthroscopy with distal clavicle resection Left 07/07/2015    Procedure: SHOULDER ARTHROSCOPY WITH DISTAL CLAVICLE RESECTION;  Surgeon: Kathryne Hitch, MD;  Location: [place];  Service: Orthopedics;  Laterality: Left;  . Shoulder arthroscopy with rotator cuff repair Left 12/07/2015    Procedure: LEFT SHOULDER ARTHROSCOPY DEBRIDEMENT, WITH ROTATOR CUFF REPAIR;  Surgeon: Ninetta Lights, MD;  Location: [place];  Service: Orthopedics;  Laterality: Left;    There were no vitals filed for this visit.      Subjective Assessment - 07/01/16 0854    Subjective Pt. reports his L shoulder has been felt tight with a dull ache 4/10 today.  Pt. confirmed MD f/u on 7/14.   Patient Stated Goals return to work and full recovery   Currently in Pain? Yes   Pain Score 4    Pain Location Shoulder   Pain Orientation Left   Pain Descriptors / Indicators Sharp   Pain  Type Surgical pain   Pain Onset More than a month ago   Pain Frequency Intermittent   Aggravating Factors  Rolling over on the L side   Pain Relieving Factors Keeping it in a neutral position   Multiple Pain Sites No            [facility] PT Assessment - 07/02/16 0001    PROM   PROM Assessment Site Shoulder   Right/Left Shoulder Left   Left Shoulder Flexion 100 Degrees   Left Shoulder ABduction 78 Degrees   Left Shoulder Internal Rotation 56 Degrees   Left Shoulder External Rotation 18 Degrees      Today's  treatment:  Manual:  L shoulder PROM all directions Gentle grade 1 posterior / caudal mobilizations   L shoulder AAROM flexion with therapist assist x 1 min   L shoulder AAROM ER with therapist assist x 1 min  L shoulder AAROM IR with therapist assist x 1 min  THerex: Supine scapular retraction squeezes 5" x 10 reps  Modalities: Vasoneumatic device to L shoulder: Seated at 30 abd, 15 min, lowest compression, lowest temp.   L shoulder PROM testing         PT Short Term Goals - 07/01/16 ID:[id number]    PT SHORT TERM GOAL #1   Title L shoulder PROM Flexion to 120, ER to 60 at 90 ABD, and IR to 45 at 90 ABD by 07/25/16   Time 4   Period Weeks   Status On-going  07/01/16:  L shoulder PROM: IR 56, ER 18 dg, 78 ABD, 100dg flexion            PT Long Term Goals - 07/01/16 [id number]    PT LONG TERM GOAL #1   Title Full PROM in order to progress into Phase II rehab (08/22/16)   Time 8   Period Weeks   Status On-going   PT LONG TERM GOAL #2   Title L shoulder AROM: flexion 120; abduction at least 90; and ir/er at least 60 without substitution for progression into phase II (08/22/16)   Time 8   Period Weeks   Status On-going   PT LONG TERM GOAL #3   Title n/a   PT LONG TERM GOAL #4   Title n/a               Plan - 07/01/16 0857    Clinical Impression Statement Pt. reports his L shoulder has been felt tight with a dull ache 4/10 today.  Pt. confirmed MD f/u on 7/14.  Pt. tolerated all L shoulder PROM into flexion, IR, ER well with gentle grade 1 posterior / caudal mobs; AAROM with therapist assist into flexion, IR, ER today however with mild pain increase with this; pt. still very guarded; all AAROM closely monitored by therapist as to elicit mild L shoulder contractions.  Pt. able to demo slightly improved PROM at L shoulder today.       PT Treatment/Interventions ADLs/Self Care Home Management;Cryotherapy;Electrical Stimulation;Moist Heat;Ultrasound;Patient/family  education;Neuromuscular re-education;Therapeutic exercise;Therapeutic activities;Functional mobility training;Manual techniques;Dry needling;Taping;Passive range of motion;Vasopneumatic Device   PT Next Visit Plan Contine PROM and begin AAROM per protocol (gravity minimized and flexion, IR and ER only)      Patient will benefit from skilled therapeutic intervention in order to improve the following deficits and impairments:  Postural dysfunction, Decreased strength, Impaired UE functional use, Pain, Decreased range of motion, Decreased scar mobility  Visit Diagnosis: Pain in left shoulder  Stiffness of left shoulder, not  elsewhere classified  Left shoulder pain  Shoulder weakness  Shoulder stiffness, left     Problem List Patient Active Problem List   Diagnosis Date Noted  . Complete rotator cuff tear of left shoulder 07/05/2015  . Tennis elbow 05/30/2014  . Rotator cuff (capsule) sprain 03/10/2014  . Right shoulder pain 02/11/2014  . Memory difficulty 12/18/2012  . Gynecomastia, male 07/30/2012  . OSA (obstructive sleep apnea) 04/23/2011  . EXTERNAL HEMORRHOIDS 02/12/2011  . Prostate cancer ([place]) 11/12/2010  . PLANTAR FASCIITIS 04/09/2010  . Hypothyroidism 12/08/2009  . NEOPLASM OF UNCERTAIN BEHAVIOR OF SKIN 03/10/2009  . OSTEOARTHRITIS 02/14/2009  . HYPERLIPIDEMIA 02/08/2009  . HEARING DEFICIT 02/08/2009  . Essential hypertension 02/08/2009  . ALLERGIC RHINITIS 02/08/2009  . GERD 02/08/2009  . PROTEINURIA 02/08/2009  . DIVERTICULITIS, HX OF 02/08/2009    Bess Harvest, PTA 07/02/2016, 2:01 PM  [facility] [place], [place], [address] Phone: [phone]   Fax:  [phone]  Name: JAMARIE BIAN MRN: [id number] Date of Birth: 08-06-1951

## 2016-07-04 ENCOUNTER — Ambulatory Visit: Payer: 59 | Admitting: Physical Therapy

## 2016-07-04 DIAGNOSIS — M25612 Stiffness of left shoulder, not elsewhere classified: Secondary | ICD-10-CM

## 2016-07-04 DIAGNOSIS — M25512 Pain in left shoulder: Secondary | ICD-10-CM

## 2016-07-04 DIAGNOSIS — R29898 Other symptoms and signs involving the musculoskeletal system: Secondary | ICD-10-CM | POA: Diagnosis not present

## 2016-07-04 NOTE — Therapy (Signed)
[facility] [address]  [place], [place], [address] Phone: [phone]   Fax:  [phone]  Physical Therapy Treatment  Patient Details  Name: Zachary [name] MRN: [id number] Date of Birth: 09-07-1951 Referring Provider: Dr. Corky Mull  Encounter Date: 07/04/2016      PT End of Session - 07/04/16 1012    Visit Number 3   Number of Visits 16   Date for PT Re-Evaluation 08/22/16   PT Start Time 0931   PT Stop Time 1018   PT Time Calculation (min) 47 min   Activity Tolerance Patient tolerated treatment well;No increased pain   Behavior During Therapy [facility] for tasks assessed/performed      Past Medical History  Diagnosis Date  . Hyperlipidemia   . Hypertension   . Insomnia   . Hypothyroidism   . GERD (gastroesophageal reflux disease)   . Arthritis   . Hemorrhoids   . Hearing problem     hearing deficit  . Diverticulitis   . Wears glasses   . Wears hearing aid     both ears  . Cancer ([place]) 2015    positive prostate cancer bx  . Sleep apnea     uses a cpap  . Articular cartilage disease     left shoulder    Past Surgical History  Procedure Laterality Date  . Hemorrhoid surgery  08/2006  . Back surgery  1978    herniated disksurgery  . Tendon repair  June 06, 2011    right elbow, Dr. Percell Miller  . Cardiac catheterization  08/01/2010  . Colonoscopy    . Shoulder arthroscopy with subacromial decompression, rotator cuff repair and bicep tendon repair Right 03/10/2014    Procedure: RIGHT SHOULDER ARTHROSCOPY WITH SUBACROMIAL DECOMPRESSION, PARTIAL ACROMIOPLASTY WITH CORACOAROMIAL LABRUM DEBRIDEMENT RELEASE DISTAL CLAVICULECTOMY,  ROTATOR CUFF REPAIR AND EXTENSIVE DEBRIDEMENT;  Surgeon: Ninetta Lights, MD;  Location: [place];  Service: Orthopedics;  Laterality: Right;  . Exam under anesthesia with manipulation of shoulder Right 09/15/2014    Procedure: RIGHT SHOULDER MANIPULATION UNDER  ANESTHESIA;  Surgeon: Ninetta Lights, MD;  Location: [place];  Service: Orthopedics;  Laterality: Right;  . Shoulder arthroscopy with rotator cuff repair and subacromial decompression Left 07/07/2015    Procedure: LEFT SHOULDER SCOPE DEBRIDEMENT, SUBACROMIAL DECOMPRESSION, DISTAL CLAVICULECTOMY, ROTATOR CUFF REPAIR  ;  Surgeon: Kathryne Hitch, MD;  Location: [place];  Service: Orthopedics;  Laterality: Left;  ANESTHESIA: GENERAL, PRE/POST OP SCALENE  . Shoulder arthroscopy with distal clavicle resection Left 07/07/2015    Procedure: SHOULDER ARTHROSCOPY WITH DISTAL CLAVICLE RESECTION;  Surgeon: Kathryne Hitch, MD;  Location: [place];  Service: Orthopedics;  Laterality: Left;  . Shoulder arthroscopy with rotator cuff repair Left 12/07/2015    Procedure: LEFT SHOULDER ARTHROSCOPY DEBRIDEMENT, WITH ROTATOR CUFF REPAIR;  Surgeon: Ninetta Lights, MD;  Location: [place];  Service: Orthopedics;  Laterality: Left;    There were no vitals filed for this visit.      Subjective Assessment - 07/04/16 0935    Subjective pain has been a 7-8/10 since yesterday. working on weaning out of sling and concerned this may have been the cause.   Patient Stated Goals return to work and full recovery   Currently in Pain? Yes   Pain Score 8    Pain Location Shoulder   Pain Orientation Left   Pain Descriptors / Indicators Sharp   Pain Type  Surgical pain   Pain Onset More than a month ago   Pain Frequency Intermittent   Aggravating Factors  rolling to L            [facility] PT Assessment - 07/04/16 1012    PROM   Overall PROM Comments measurements taken 07/01/16   Left Shoulder Flexion 100 Degrees   Left Shoulder ABduction 78 Degrees   Left Shoulder Internal Rotation 56 Degrees   Left Shoulder External Rotation 18 Degrees                     [facility] Adult PT Treatment/Exercise - 07/04/16 0001    Shoulder Exercises: Supine    External Rotation AAROM;Left;10 reps   Internal Rotation AAROM;Left;10 reps   Shoulder Exercises: Seated   Retraction Both;10 reps   Retraction Limitations 10 sec hold   Shoulder Exercises: Sidelying   Flexion AAROM;10 reps   Other Sidelying Exercises scap retraction L 10x10 sec hold   Vasopneumatic   Number Minutes Vasopneumatic  15 minutes   Vasopnuematic Location  Shoulder   Vasopneumatic Pressure Low   Vasopneumatic Temperature  max cold   Manual Therapy   Manual Therapy Passive ROM;Joint mobilization   Joint Mobilization gentle grade 1 A/P and inf mobs L shoulder   Passive ROM L shoulder flexion and ir/er in scaption and in 80 degrees abdct per protocol limits; abduction to 90 per protocol limits                  PT Short Term Goals - 07/01/16 [id number]    PT SHORT TERM GOAL #1   Title L shoulder PROM Flexion to 120, ER to 60 at 90 ABD, and IR to 45 at 90 ABD by 07/25/16   Time 4   Period Weeks   Status On-going  07/01/16:  L shoulder PROM: IR 56, ER 18 dg, 78 ABD, 100dg flexion            PT Long Term Goals - 07/01/16 [id number]    PT LONG TERM GOAL #1   Title Full PROM in order to progress into Phase II rehab (08/22/16)   Time 8   Period Weeks   Status On-going   PT LONG TERM GOAL #2   Title L shoulder AROM: flexion 120; abduction at least 90; and ir/er at least 60 without substitution for progression into phase II (08/22/16)   Time 8   Period Weeks   Status On-going   PT LONG TERM GOAL #3   Title n/a   PT LONG TERM GOAL #4   Title n/a               Plan - 07/04/16 1014    Clinical Impression Statement Pt with increased pain today likely due to trying to wean from sling per PA recommendation.  Small improvements made in PROM per measurements from last session.  Slowly progressing well towards goals.   PT Next Visit Plan Contine PROM and begin AAROM per protocol (gravity minimized and flexion, IR and ER only); follow protocol.      Patient will benefit  from skilled therapeutic intervention in order to improve the following deficits and impairments:  Postural dysfunction, Decreased strength, Impaired UE functional use, Pain, Decreased range of motion, Decreased scar mobility  Visit Diagnosis: Pain in left shoulder  Stiffness of left shoulder, not elsewhere classified     Problem List Patient Active Problem List   Diagnosis Date Noted  . Complete rotator cuff  tear of left shoulder 07/05/2015  . Tennis elbow 05/30/2014  . Rotator cuff (capsule) sprain 03/10/2014  . Right shoulder pain 02/11/2014  . Memory difficulty 12/18/2012  . Gynecomastia, male 07/30/2012  . OSA (obstructive sleep apnea) 04/23/2011  . EXTERNAL HEMORRHOIDS 02/12/2011  . Prostate cancer ([place]) 11/12/2010  . PLANTAR FASCIITIS 04/09/2010  . Hypothyroidism 12/08/2009  . NEOPLASM OF UNCERTAIN BEHAVIOR OF SKIN 03/10/2009  . OSTEOARTHRITIS 02/14/2009  . HYPERLIPIDEMIA 02/08/2009  . HEARING DEFICIT 02/08/2009  . Essential hypertension 02/08/2009  . ALLERGIC RHINITIS 02/08/2009  . GERD 02/08/2009  . PROTEINURIA 02/08/2009  . DIVERTICULITIS, HX OF 02/08/2009   Laureen Abrahams, PT, DPT 07/04/2016 10:18 AM    [facility] [address]  [place], [place], [address] Phone: [phone]   Fax:  [phone]  Name: Zachary [name] MRN: [id number] Date of Birth: 15-May-1951

## 2016-07-08 ENCOUNTER — Ambulatory Visit: Payer: 59

## 2016-07-08 DIAGNOSIS — M25512 Pain in left shoulder: Secondary | ICD-10-CM | POA: Diagnosis not present

## 2016-07-08 DIAGNOSIS — M25612 Stiffness of left shoulder, not elsewhere classified: Secondary | ICD-10-CM | POA: Diagnosis not present

## 2016-07-08 DIAGNOSIS — R29898 Other symptoms and signs involving the musculoskeletal system: Secondary | ICD-10-CM | POA: Diagnosis not present

## 2016-07-08 NOTE — Therapy (Signed)
[facility] [address]  [place], [place], [address] Phone: [phone]   Fax:  [phone]  Physical Therapy Treatment  Patient Details  Name: Zachary [name] MRN: [id number] Date of Birth: 08-04-1951 Referring Provider: Dr. Corky Mull  Encounter Date: 07/08/2016      PT End of Session - 07/08/16 0950    Visit Number 4   Number of Visits 16   Date for PT Re-Evaluation 08/22/16   PT Start Time 0938   PT Stop Time 1018   PT Time Calculation (min) 40 min   Activity Tolerance Patient tolerated treatment well;No increased pain   Behavior During Therapy [facility] for tasks assessed/performed      Past Medical History  Diagnosis Date  . Hyperlipidemia   . Hypertension   . Insomnia   . Hypothyroidism   . GERD (gastroesophageal reflux disease)   . Arthritis   . Hemorrhoids   . Hearing problem     hearing deficit  . Diverticulitis   . Wears glasses   . Wears hearing aid     both ears  . Cancer ([place]) 2015    positive prostate cancer bx  . Sleep apnea     uses a cpap  . Articular cartilage disease     left shoulder    Past Surgical History  Procedure Laterality Date  . Hemorrhoid surgery  08/2006  . Back surgery  1978    herniated disksurgery  . Tendon repair  June 06, 2011    right elbow, Dr. Percell Miller  . Cardiac catheterization  08/01/2010  . Colonoscopy    . Shoulder arthroscopy with subacromial decompression, rotator cuff repair and bicep tendon repair Right 03/10/2014    Procedure: RIGHT SHOULDER ARTHROSCOPY WITH SUBACROMIAL DECOMPRESSION, PARTIAL ACROMIOPLASTY WITH CORACOAROMIAL LABRUM DEBRIDEMENT RELEASE DISTAL CLAVICULECTOMY,  ROTATOR CUFF REPAIR AND EXTENSIVE DEBRIDEMENT;  Surgeon: Ninetta Lights, MD;  Location: [place];  Service: Orthopedics;  Laterality: Right;  . Exam under anesthesia with manipulation of shoulder Right 09/15/2014    Procedure: RIGHT SHOULDER MANIPULATION UNDER  ANESTHESIA;  Surgeon: Ninetta Lights, MD;  Location: [place];  Service: Orthopedics;  Laterality: Right;  . Shoulder arthroscopy with rotator cuff repair and subacromial decompression Left 07/07/2015    Procedure: LEFT SHOULDER SCOPE DEBRIDEMENT, SUBACROMIAL DECOMPRESSION, DISTAL CLAVICULECTOMY, ROTATOR CUFF REPAIR  ;  Surgeon: Kathryne Hitch, MD;  Location: [place];  Service: Orthopedics;  Laterality: Left;  ANESTHESIA: GENERAL, PRE/POST OP SCALENE  . Shoulder arthroscopy with distal clavicle resection Left 07/07/2015    Procedure: SHOULDER ARTHROSCOPY WITH DISTAL CLAVICLE RESECTION;  Surgeon: Kathryne Hitch, MD;  Location: [place];  Service: Orthopedics;  Laterality: Left;  . Shoulder arthroscopy with rotator cuff repair Left 12/07/2015    Procedure: LEFT SHOULDER ARTHROSCOPY DEBRIDEMENT, WITH ROTATOR CUFF REPAIR;  Surgeon: Ninetta Lights, MD;  Location: [place];  Service: Orthopedics;  Laterality: Left;    There were no vitals filed for this visit.      Subjective Assessment - 07/08/16 0947    Subjective Pt. reports a 5/10 initial L shoulder pain; seen initially in sling; pt. reports MD visit on 7/14 went well with MD instructing pt. to wear sling on L shoulder 100% of time at work.    Patient Stated Goals return to work and full recovery   Currently in Pain? Yes   Pain Score 5    Pain Location Shoulder  Pain Orientation Left   Pain Descriptors / Indicators Sharp   Pain Type Surgical pain   Pain Onset More than a month ago   Pain Frequency Intermittent   Multiple Pain Sites No            [facility] PT Assessment - 07/08/16 0949    Assessment   Medical Diagnosis L RTC repair   Referring Provider Dr. Corky Mull   Next MD Visit 08/23/16       Today's treatment:  Manual:  L shoulder PROM all directions Gentle grade 1 posterior / caudal mobilizations  L shoulder AAROM flexion with therapist assist x 1 min   L shoulder AAROM ER with therapist assist x 1 min  L shoulder AAROM IR with therapist assist x 1 min Seated TPM to L superior medial scapular border and teres minor x 5 min   Therex: Supine scapular retraction squeezes 5" x 10 reps Seated scapular retraction squeezes 5" x 10 reps   Modalities: Vasoneumatic device to L shoulder: Seated at 30 abd, 15 min, lowest compression, lowest temp.         PT Short Term Goals - 07/01/16 [id number]    PT SHORT TERM GOAL #1   Title L shoulder PROM Flexion to 120, ER to 60 at 90 ABD, and IR to 45 at 90 ABD by 07/25/16   Time 4   Period Weeks   Status On-going  07/01/16:  L shoulder PROM: IR 56, ER 18 dg, 78 ABD, 100dg flexion            PT Long Term Goals - 07/01/16 [id number]    PT LONG TERM GOAL #1   Title Full PROM in order to progress into Phase II rehab (08/22/16)   Time 8   Period Weeks   Status On-going   PT LONG TERM GOAL #2   Title L shoulder AROM: flexion 120; abduction at least 90; and ir/er at least 60 without substitution for progression into phase II (08/22/16)   Time 8   Period Weeks   Status On-going   PT LONG TERM GOAL #3   Title n/a   PT LONG TERM GOAL #4   Title n/a               Plan - 07/08/16 0950    Clinical Impression Statement Pt. reports a 5/10 initial L shoulder pain; seen initially in sling; pt. reports MD visit on 7/14 went well with MD instructing pt. to wear sling on L shoulder 100% of time at work. Pt. tolerated all L shoulder PROM and AAROM into IR, ER, and flexion well however still with L shoulder pain increase with these motions; significant time spent in today's treatment spent on STM/TPM to teres minor and along superior angle of L scapular.  Pt. finished treatment with L shoulder pain returned to baseline 5/10 pain.     PT Treatment/Interventions ADLs/Self Care Home Management;Cryotherapy;Electrical Stimulation;Moist Heat;Ultrasound;Patient/family education;Neuromuscular re-education;Therapeutic  exercise;Therapeutic activities;Functional mobility training;Manual techniques;Dry needling;Taping;Passive range of motion;Vasopneumatic Device   PT Next Visit Plan Contine PROM and begin AAROM per protocol (gravity minimized and flexion, IR and ER only); follow protocol.      Patient will benefit from skilled therapeutic intervention in order to improve the following deficits and impairments:  Postural dysfunction, Decreased strength, Impaired UE functional use, Pain, Decreased range of motion, Decreased scar mobility  Visit Diagnosis: Pain in left shoulder  Stiffness of left shoulder, not elsewhere classified     Problem List Patient  Active Problem List   Diagnosis Date Noted  . Complete rotator cuff tear of left shoulder 07/05/2015  . Tennis elbow 05/30/2014  . Rotator cuff (capsule) sprain 03/10/2014  . Right shoulder pain 02/11/2014  . Memory difficulty 12/18/2012  . Gynecomastia, male 07/30/2012  . OSA (obstructive sleep apnea) 04/23/2011  . EXTERNAL HEMORRHOIDS 02/12/2011  . Prostate cancer ([place]) 11/12/2010  . PLANTAR FASCIITIS 04/09/2010  . Hypothyroidism 12/08/2009  . NEOPLASM OF UNCERTAIN BEHAVIOR OF SKIN 03/10/2009  . OSTEOARTHRITIS 02/14/2009  . HYPERLIPIDEMIA 02/08/2009  . HEARING DEFICIT 02/08/2009  . Essential hypertension 02/08/2009  . ALLERGIC RHINITIS 02/08/2009  . GERD 02/08/2009  . PROTEINURIA 02/08/2009  . DIVERTICULITIS, HX OF 02/08/2009    Bess Harvest, PTA 07/08/2016, 1:06 PM  [facility] [address]  [place], [place], [address] Phone: [phone]   Fax:  [phone]  Name: Zachary [name] MRN: [id number] Date of Birth: 05/18/1951

## 2016-07-11 ENCOUNTER — Ambulatory Visit: Payer: 59 | Admitting: Physical Therapy

## 2016-07-11 DIAGNOSIS — M25512 Pain in left shoulder: Secondary | ICD-10-CM | POA: Diagnosis not present

## 2016-07-11 DIAGNOSIS — M25612 Stiffness of left shoulder, not elsewhere classified: Secondary | ICD-10-CM

## 2016-07-11 DIAGNOSIS — R29898 Other symptoms and signs involving the musculoskeletal system: Secondary | ICD-10-CM | POA: Diagnosis not present

## 2016-07-11 MED FILL — LISINOPRIL 5 MG TABLET: 5 | 90 days supply | Qty: 90 | Fill #1

## 2016-07-11 NOTE — Therapy (Signed)
[facility] [address]  [place], [place], [address] Phone: [phone]   Fax:  [phone]  Physical Therapy Treatment  Patient Details  Name: Zachary [name] MRN: [id number] Date of Birth: [birth year] Referring Provider: Dr. Corky Mull  Encounter Date: 07/11/2016      PT End of Session - 07/11/16 1031    Visit Number 5   Number of Visits 16   Date for PT Re-Evaluation 08/22/16   PT Start Time 0931   PT Stop Time 1027   PT Time Calculation (min) 56 min   Activity Tolerance Patient tolerated treatment well   Behavior During Therapy [facility] for tasks assessed/performed      Past Medical History  Diagnosis Date  . Hyperlipidemia   . Hypertension   . Insomnia   . Hypothyroidism   . GERD (gastroesophageal reflux disease)   . Arthritis   . Hemorrhoids   . Hearing problem     hearing deficit  . Diverticulitis   . Wears glasses   . Wears hearing aid     both ears  . Cancer ([place]) 2015    positive prostate cancer bx  . Sleep apnea     uses a cpap  . Articular cartilage disease     left shoulder    Past Surgical History  Procedure Laterality Date  . Hemorrhoid surgery  08/2006  . Back surgery  1978    herniated disksurgery  . Tendon repair  June 06, 2011    right elbow, Dr. Percell Miller  . Cardiac catheterization  08/01/2010  . Colonoscopy    . Shoulder arthroscopy with subacromial decompression, rotator cuff repair and bicep tendon repair Right 03/10/2014    Procedure: RIGHT SHOULDER ARTHROSCOPY WITH SUBACROMIAL DECOMPRESSION, PARTIAL ACROMIOPLASTY WITH CORACOAROMIAL LABRUM DEBRIDEMENT RELEASE DISTAL CLAVICULECTOMY,  ROTATOR CUFF REPAIR AND EXTENSIVE DEBRIDEMENT;  Surgeon: Ninetta Lights, MD;  Location: [place];  Service: Orthopedics;  Laterality: Right;  . Exam under anesthesia with manipulation of shoulder Right 09/15/2014    Procedure: RIGHT SHOULDER MANIPULATION UNDER ANESTHESIA;   Surgeon: Ninetta Lights, MD;  Location: [place];  Service: Orthopedics;  Laterality: Right;  . Shoulder arthroscopy with rotator cuff repair and subacromial decompression Left 07/07/2015    Procedure: LEFT SHOULDER SCOPE DEBRIDEMENT, SUBACROMIAL DECOMPRESSION, DISTAL CLAVICULECTOMY, ROTATOR CUFF REPAIR  ;  Surgeon: Kathryne Hitch, MD;  Location: [place];  Service: Orthopedics;  Laterality: Left;  ANESTHESIA: GENERAL, PRE/POST OP SCALENE  . Shoulder arthroscopy with distal clavicle resection Left 07/07/2015    Procedure: SHOULDER ARTHROSCOPY WITH DISTAL CLAVICLE RESECTION;  Surgeon: Kathryne Hitch, MD;  Location: [place];  Service: Orthopedics;  Laterality: Left;  . Shoulder arthroscopy with rotator cuff repair Left 12/07/2015    Procedure: LEFT SHOULDER ARTHROSCOPY DEBRIDEMENT, WITH ROTATOR CUFF REPAIR;  Surgeon: Ninetta Lights, MD;  Location: [place];  Service: Orthopedics;  Laterality: Left;    There were no vitals filed for this visit.      Subjective Assessment - 07/11/16 0935    Subjective weaning from sling except on at all times at work.  back to work on 7/17 full time   Patient Stated Goals return to work and full recovery   Currently in Pain? Yes   Pain Score 5    Pain Location Shoulder   Pain Descriptors / Indicators Aching   Pain Type Surgical pain   Pain Onset More than a month  ago   Pain Frequency Intermittent   Aggravating Factors  rolling   Pain Relieving Factors stabilization                         [facility] Adult PT Treatment/Exercise - 07/11/16 0952    Shoulder Exercises: Supine   External Rotation AROM;Left;15 reps   External Rotation Limitations ~ 30 degrees abdct   Internal Rotation AROM;Left;15 reps   Internal Rotation Limitations ~ 30 degrees abdct   Flexion Left;15 reps;AROM   Flexion Limitations to 90 only   ABduction Left;15 reps;AROM   ABduction Limitations to ~ 80  degrees   Shoulder Exercises: Seated   Retraction Both;20 reps   Retraction Limitations 5 sec hold   Shoulder Exercises: Isometric Strengthening   Flexion Limitations 10x10"   Extension Limitations 10x10"   External Rotation Limitations 10x10"   Internal Rotation Limitations 10x10"   Manual Therapy   Manual Therapy Passive ROM;Joint mobilization   Joint Mobilization gentle grade 1 A/P and inf mobs L shoulder   Passive ROM L shoulder flexion and ir/er in scaption and in 80 degrees abdct per protocol limits; abduction to 90 per protocol limits                  PT Short Term Goals - 07/01/16 [id number]    PT SHORT TERM GOAL #1   Title L shoulder PROM Flexion to 120, ER to 60 at 90 ABD, and IR to 45 at 90 ABD by 07/25/16   Time 4   Period Weeks   Status On-going  07/01/16:  L shoulder PROM: IR 56, ER 18 dg, 78 ABD, 100dg flexion            PT Long Term Goals - 07/01/16 [id number]    PT LONG TERM GOAL #1   Title Full PROM in order to progress into Phase II rehab (08/22/16)   Time 8   Period Weeks   Status On-going   PT LONG TERM GOAL #2   Title L shoulder AROM: flexion 120; abduction at least 90; and ir/er at least 60 without substitution for progression into phase II (08/22/16)   Time 8   Period Weeks   Status On-going   PT LONG TERM GOAL #3   Title n/a   PT LONG TERM GOAL #4   Title n/a               Plan - 07/11/16 1031    Clinical Impression Statement Pt tolerated increased activity today and able to begin AROM today.  Pt performed AROM (with therapist guidance - no assistance provided) and isometric today without increase in pain.  Slowly progressing towards goals.   PT Next Visit Plan follow protocol; slowly increasing AROM below shoulder height    Consulted and Agree with Plan of Care Patient      Patient will benefit from skilled therapeutic intervention in order to improve the following deficits and impairments:  Postural dysfunction, Decreased strength,  Impaired UE functional use, Pain, Decreased range of motion, Decreased scar mobility  Visit Diagnosis: Pain in left shoulder  Stiffness of left shoulder, not elsewhere classified     Problem List Patient Active Problem List   Diagnosis Date Noted  . Complete rotator cuff tear of left shoulder 07/05/2015  . Tennis elbow 05/30/2014  . Rotator cuff (capsule) sprain 03/10/2014  . Right shoulder pain 02/11/2014  . Memory difficulty 12/18/2012  . Gynecomastia, male 07/30/2012  . OSA (obstructive  sleep apnea) 04/23/2011  . EXTERNAL HEMORRHOIDS 02/12/2011  . Prostate cancer ([place]) 11/12/2010  . PLANTAR FASCIITIS 04/09/2010  . Hypothyroidism 12/08/2009  . NEOPLASM OF UNCERTAIN BEHAVIOR OF SKIN 03/10/2009  . OSTEOARTHRITIS 02/14/2009  . HYPERLIPIDEMIA 02/08/2009  . HEARING DEFICIT 02/08/2009  . Essential hypertension 02/08/2009  . ALLERGIC RHINITIS 02/08/2009  . GERD 02/08/2009  . PROTEINURIA 02/08/2009  . DIVERTICULITIS, HX OF 02/08/2009   Laureen Abrahams, PT, DPT 07/11/2016 10:34 AM  [facility] [address]  [place], [place], [address] Phone: [phone]   Fax:  [phone]  Name: Zachary [name] MRN: [id number] Date of Birth: 03-Sep-1951

## 2016-07-15 ENCOUNTER — Ambulatory Visit: Payer: 59

## 2016-07-15 DIAGNOSIS — M25512 Pain in left shoulder: Secondary | ICD-10-CM

## 2016-07-15 DIAGNOSIS — R29898 Other symptoms and signs involving the musculoskeletal system: Secondary | ICD-10-CM | POA: Diagnosis not present

## 2016-07-15 DIAGNOSIS — M25612 Stiffness of left shoulder, not elsewhere classified: Secondary | ICD-10-CM | POA: Diagnosis not present

## 2016-07-15 NOTE — Therapy (Signed)
[facility] [address]  [place], [place], [address] Phone: [phone]   Fax:  [phone]  Physical Therapy Treatment  Patient Details  Name: Zachary [name] MRN: [id number] Date of Birth: 19-Nov-1951 Referring Provider: Dr. Corky Mull  Encounter Date: 07/15/2016      PT End of Session - 07/15/16 0941    Visit Number 6   Number of Visits 16   Date for PT Re-Evaluation 08/22/16   PT Start Time 0935   PT Stop Time 1030   PT Time Calculation (min) 55 min   Activity Tolerance Patient tolerated treatment well   Behavior During Therapy [facility] for tasks assessed/performed      Past Medical History:  Diagnosis Date  . Arthritis   . Articular cartilage disease    left shoulder  . Cancer ([place]) 2015   positive prostate cancer bx  . Diverticulitis   . GERD (gastroesophageal reflux disease)   . Hearing problem    hearing deficit  . Hemorrhoids   . Hyperlipidemia   . Hypertension   . Hypothyroidism   . Insomnia   . Sleep apnea    uses a cpap  . Wears glasses   . Wears hearing aid    both ears    Past Surgical History:  Procedure Laterality Date  . BACK SURGERY  1978   herniated disksurgery  . CARDIAC CATHETERIZATION  08/01/2010  . COLONOSCOPY    . EXAM UNDER ANESTHESIA WITH MANIPULATION OF SHOULDER Right 09/15/2014   Procedure: RIGHT SHOULDER MANIPULATION UNDER ANESTHESIA;  Surgeon: Ninetta Lights, MD;  Location: [place];  Service: Orthopedics;  Laterality: Right;  . HEMORRHOID SURGERY  08/2006  . SHOULDER ARTHROSCOPY WITH DISTAL CLAVICLE RESECTION Left 07/07/2015   Procedure: SHOULDER ARTHROSCOPY WITH DISTAL CLAVICLE RESECTION;  Surgeon: Kathryne Hitch, MD;  Location: [employer];  Service: Orthopedics;  Laterality: Left;  . SHOULDER ARTHROSCOPY WITH ROTATOR CUFF REPAIR Left 12/07/2015   Procedure: LEFT SHOULDER ARTHROSCOPY DEBRIDEMENT, WITH ROTATOR CUFF REPAIR;  Surgeon:  Ninetta Lights, MD;  Location: [place];  Service: Orthopedics;  Laterality: Left;  . SHOULDER ARTHROSCOPY WITH ROTATOR CUFF REPAIR AND SUBACROMIAL DECOMPRESSION Left 07/07/2015   Procedure: LEFT SHOULDER SCOPE DEBRIDEMENT, SUBACROMIAL DECOMPRESSION, DISTAL CLAVICULECTOMY, ROTATOR CUFF REPAIR  ;  Surgeon: Kathryne Hitch, MD;  Location: [place];  Service: Orthopedics;  Laterality: Left;  ANESTHESIA: GENERAL, PRE/POST OP SCALENE  . SHOULDER ARTHROSCOPY WITH SUBACROMIAL DECOMPRESSION, ROTATOR CUFF REPAIR AND BICEP TENDON REPAIR Right 03/10/2014   Procedure: RIGHT SHOULDER ARTHROSCOPY WITH SUBACROMIAL DECOMPRESSION, PARTIAL ACROMIOPLASTY WITH CORACOAROMIAL LABRUM DEBRIDEMENT RELEASE DISTAL CLAVICULECTOMY,  ROTATOR CUFF REPAIR AND EXTENSIVE DEBRIDEMENT;  Surgeon: Ninetta Lights, MD;  Location: [place];  Service: Orthopedics;  Laterality: Right;  . TENDON REPAIR  June 06, 2011   right elbow, Dr. Percell Miller    There were no vitals filed for this visit.      Subjective Assessment - 07/15/16 0940    Subjective Pt. reports he has an initial L shoulder pain of 4/10 initially today; no other pain or complaints reported.     Patient Stated Goals return to work and full recovery   Currently in Pain? Yes   Pain Score 4    Pain Location Shoulder   Pain Orientation Left   Pain Descriptors / Indicators Aching   Pain Type Surgical pain   Pain Onset More than a month ago   Pain  Frequency Intermittent   Aggravating Factors  rolling   Multiple Pain Sites No       Today's Treatment:  Manual: L shoulder PROM all directions L AAROM flexion, IR/ER, extension therapist assisted x 30 sec each way  L scapular superior angle TPR/STM  L teres minor TPR/STM Supine L scapular mobs all directions   Therex: Seated scapular retraction 5" 2 x 10 reps  R sidelying L shoulder flexion resting on blue bolsters x 30 sec; 6/10 pain with this L shoulder AAROM ER with  wand x 20 reps   Vasoneumatic device to L shoulder: L shoulder ~ 30 abd, seated, coldest temp., low compression, 10'          PT Short Term Goals - 07/01/16 [id number]      PT SHORT TERM GOAL #1   Title L shoulder PROM Flexion to 120, ER to 60 at 90 ABD, and IR to 45 at 90 ABD by 07/25/16   Time 4   Period Weeks   Status On-going  07/01/16:  L shoulder PROM: IR 56, ER 18 dg, 78 ABD, 100dg flexion            PT Long Term Goals - 07/01/16 [id number]      PT LONG TERM GOAL #1   Title Full PROM in order to progress into Phase II rehab (08/22/16)   Time 8   Period Weeks   Status On-going     PT LONG TERM GOAL #2   Title L shoulder AROM: flexion 120; abduction at least 90; and ir/er at least 60 without substitution for progression into phase II (08/22/16)   Time 8   Period Weeks   Status On-going     PT LONG TERM GOAL #3   Title n/a     PT LONG TERM GOAL #4   Title n/a               Plan - 07/15/16 0941    Clinical Impression Statement Pt. reports he has an initial L shoulder pain of 4/10 initially today; no other pain or complaints reported.  Pt. tolerated all L shoulder AAROM and AROM well today with rise from baseline to 6/10 L shoulder pain with flexion; all L shoulder AROM performed with therapist guidance.     PT Treatment/Interventions ADLs/Self Care Home Management;Cryotherapy;Electrical Stimulation;Moist Heat;Ultrasound;Patient/family education;Neuromuscular re-education;Therapeutic exercise;Therapeutic activities;Functional mobility training;Manual techniques;Dry needling;Taping;Passive range of motion;Vasopneumatic Device   PT Next Visit Plan follow protocol; slowly increasing AROM below shoulder height       Patient will benefit from skilled therapeutic intervention in order to improve the following deficits and impairments:  Postural dysfunction, Decreased strength, Impaired UE functional use, Pain, Decreased range of motion, Decreased scar mobility  Visit  Diagnosis: Pain in left shoulder  Stiffness of left shoulder, not elsewhere classified     Problem List Patient Active Problem List   Diagnosis Date Noted  . Complete rotator cuff tear of left shoulder 07/05/2015  . Tennis elbow 05/30/2014  . Rotator cuff (capsule) sprain 03/10/2014  . Right shoulder pain 02/11/2014  . Memory difficulty 12/18/2012  . Gynecomastia, male 07/30/2012  . OSA (obstructive sleep apnea) 04/23/2011  . EXTERNAL HEMORRHOIDS 02/12/2011  . Prostate cancer ([place]) 11/12/2010  . PLANTAR FASCIITIS 04/09/2010  . Hypothyroidism 12/08/2009  . NEOPLASM OF UNCERTAIN BEHAVIOR OF SKIN 03/10/2009  . OSTEOARTHRITIS 02/14/2009  . HYPERLIPIDEMIA 02/08/2009  . HEARING DEFICIT 02/08/2009  . Essential hypertension 02/08/2009  . ALLERGIC RHINITIS 02/08/2009  . GERD 02/08/2009  .  PROTEINURIA 02/08/2009  . DIVERTICULITIS, HX OF 02/08/2009    Bess Harvest, PTA 07/15/2016, 1:06 PM  [facility] [address]  [place], [place], [address] Phone: [phone]   Fax:  [phone]  Name: Zachary [name] MRN: [id number] Date of Birth: 03/24/1951

## 2016-07-18 ENCOUNTER — Ambulatory Visit: Payer: 59 | Admitting: Physical Therapy

## 2016-07-18 DIAGNOSIS — R29898 Other symptoms and signs involving the musculoskeletal system: Secondary | ICD-10-CM | POA: Diagnosis not present

## 2016-07-18 DIAGNOSIS — M25612 Stiffness of left shoulder, not elsewhere classified: Secondary | ICD-10-CM | POA: Diagnosis not present

## 2016-07-18 DIAGNOSIS — M25512 Pain in left shoulder: Secondary | ICD-10-CM | POA: Diagnosis not present

## 2016-07-18 NOTE — Patient Instructions (Signed)
FLEXION: Side-Lying - Elbow Extended Powder Board (Active - Assistance)    Lie on right side, top arm resting on ball or support.   Slide arm up above head, keeping elbow straight. Complete _1__ sets of _10-15__ repetitions. Perform _2-3__ sessions per day.  Copyright  VHI. All rights reserved.    ADDUCTION: Supine - Powder Board (Active - Assistance)    Lie on back, left arm raise arm out to side on board. Slide arm in to body, keeping elbow straight. Complete __1_ sets of _10-15__ repetitions. Perform _2-3__ sessions per day.  Copyright  VHI. All rights reserved.

## 2016-07-18 NOTE — Therapy (Signed)
[facility] [address]  [place], [place], [address] Phone: [phone]   Fax:  [phone]  Physical Therapy Treatment  Patient Details  Name: Zachary [name] MRN: [id number] Date of Birth: [birth year] Referring Provider: Dr. Corky Mull  Encounter Date: 07/18/2016      PT End of Session - 07/18/16 1013    Visit Number 7   Number of Visits 16   Date for PT Re-Evaluation 08/22/16   PT Start Time 0931   PT Stop Time 1017   PT Time Calculation (min) 46 min   Activity Tolerance Patient tolerated treatment well   Behavior During Therapy [facility] [place] for tasks assessed/performed      Past Medical History:  Diagnosis Date  . Arthritis   . Articular cartilage disease    left shoulder  . Cancer ([place]) 2015   positive prostate cancer bx  . Diverticulitis   . GERD (gastroesophageal reflux disease)   . Hearing problem    hearing deficit  . Hemorrhoids   . Hyperlipidemia   . Hypertension   . Hypothyroidism   . Insomnia   . Sleep apnea    uses a cpap  . Wears glasses   . Wears hearing aid    both ears    Past Surgical History:  Procedure Laterality Date  . BACK SURGERY  1978   herniated disksurgery  . CARDIAC CATHETERIZATION  08/01/2010  . COLONOSCOPY    . EXAM UNDER ANESTHESIA WITH MANIPULATION OF SHOULDER Right 09/15/2014   Procedure: RIGHT SHOULDER MANIPULATION UNDER ANESTHESIA;  Surgeon: Ninetta Lights, MD;  Location: [place];  Service: Orthopedics;  Laterality: Right;  . HEMORRHOID SURGERY  08/2006  . SHOULDER ARTHROSCOPY WITH DISTAL CLAVICLE RESECTION Left 07/07/2015   Procedure: SHOULDER ARTHROSCOPY WITH DISTAL CLAVICLE RESECTION;  Surgeon: Kathryne Hitch, MD;  Location: [place];  Service: Orthopedics;  Laterality: Left;  . SHOULDER ARTHROSCOPY WITH ROTATOR CUFF REPAIR Left 12/07/2015   Procedure: LEFT SHOULDER ARTHROSCOPY DEBRIDEMENT, WITH ROTATOR CUFF REPAIR;  Surgeon:  Ninetta Lights, MD;  Location: [place];  Service: Orthopedics;  Laterality: Left;  . SHOULDER ARTHROSCOPY WITH ROTATOR CUFF REPAIR AND SUBACROMIAL DECOMPRESSION Left 07/07/2015   Procedure: LEFT SHOULDER SCOPE DEBRIDEMENT, SUBACROMIAL DECOMPRESSION, DISTAL CLAVICULECTOMY, ROTATOR CUFF REPAIR  ;  Surgeon: Kathryne Hitch, MD;  Location: [place];  Service: Orthopedics;  Laterality: Left;  ANESTHESIA: GENERAL, PRE/POST OP SCALENE  . SHOULDER ARTHROSCOPY WITH SUBACROMIAL DECOMPRESSION, ROTATOR CUFF REPAIR AND BICEP TENDON REPAIR Right 03/10/2014   Procedure: RIGHT SHOULDER ARTHROSCOPY WITH SUBACROMIAL DECOMPRESSION, PARTIAL ACROMIOPLASTY WITH CORACOAROMIAL LABRUM DEBRIDEMENT RELEASE DISTAL CLAVICULECTOMY,  ROTATOR CUFF REPAIR AND EXTENSIVE DEBRIDEMENT;  Surgeon: Ninetta Lights, MD;  Location: [place];  Service: Orthopedics;  Laterality: Right;  . TENDON REPAIR  June 06, 2011   right elbow, Dr. Percell Miller    There were no vitals filed for this visit.      Subjective Assessment - 07/18/16 0934    Subjective still can't sleep at night - waking 3-4 times usually when rolling over   Pertinent History 2 previous RTC repairs   Limitations Lifting;House hold activities   Patient Stated Goals return to work and full recovery   Currently in Pain? Yes   Pain Score 4    Pain Location Shoulder   Pain Orientation Left   Pain Descriptors / Indicators Sharp   Pain Type Surgical pain   Pain Onset More than  a month ago   Pain Frequency Intermittent   Aggravating Factors  rolling onto L side   Pain Relieving Factors stabilization            [facility] PT Assessment - 07/18/16 0947      AROM   Left Shoulder Flexion 134 Degrees   Left Shoulder ABduction 89 Degrees   Left Shoulder Internal Rotation 60 Degrees   Left Shoulder External Rotation 35 Degrees                     [facility] Adult PT Treatment/Exercise - 07/18/16 0001      Shoulder  Exercises: Supine   External Rotation Left;AROM;10 reps   Internal Rotation Left;AROM;10 reps   Flexion Left;10 reps;AROM     Shoulder Exercises: Sidelying   Other Sidelying Exercises scap retraction L 10x10 sec hold     Vasopneumatic   Number Minutes Vasopneumatic  15 minutes   Vasopnuematic Location  Shoulder   Vasopneumatic Pressure Low   Vasopneumatic Temperature  max cold     Manual Therapy   Manual Therapy Passive ROM;Joint mobilization   Joint Mobilization gentle grade 1 A/P and inf mobs L shoulder   Passive ROM L shoulder flexion, abdct and ir/er to tolerance - per protocol able to progress as tolerated                PT Education - 07/18/16 1013    Education provided Yes   Education Details HEP-AAROM   Person(s) Educated Patient   Methods Explanation;Demonstration;Handout   Comprehension Verbalized understanding;Need further instruction          PT Short Term Goals - 07/01/16 0858      PT SHORT TERM GOAL #1   Title L shoulder PROM Flexion to 120, ER to 60 at 90 ABD, and IR to 45 at 90 ABD by 07/25/16   Time 4   Period Weeks   Status On-going  07/01/16:  L shoulder PROM: IR 56, ER 18 dg, 78 ABD, 100dg flexion            PT Long Term Goals - 07/01/16 [id number]      PT LONG TERM GOAL #1   Title Full PROM in order to progress into Phase II rehab (08/22/16)   Time 8   Period Weeks   Status On-going     PT LONG TERM GOAL #2   Title L shoulder AROM: flexion 120; abduction at least 90; and ir/er at least 60 without substitution for progression into phase II (08/22/16)   Time 8   Period Weeks   Status On-going     PT LONG TERM GOAL #3   Title n/a     PT LONG TERM GOAL #4   Title n/a               Plan - 07/18/16 1014    Clinical Impression Statement Able to get AROM measurements today and progress to PROM as tolerated.  Slowly progressing towards goals; but continues to c/o soreness with active movement   PT Treatment/Interventions ADLs/Self  Care Home Management;Cryotherapy;Electrical Stimulation;Moist Heat;Ultrasound;Patient/family education;Neuromuscular re-education;Therapeutic exercise;Therapeutic activities;Functional mobility training;Manual techniques;Dry needling;Taping;Passive range of motion;Vasopneumatic Device   PT Next Visit Plan follow protocol; slowly increasing AROM to tolerance   Consulted and Agree with Plan of Care Patient      Patient will benefit from skilled therapeutic intervention in order to improve the following deficits and impairments:  Postural dysfunction, Decreased strength, Impaired UE functional use, Pain,  Decreased range of motion, Decreased scar mobility  Visit Diagnosis: Pain in left shoulder  Stiffness of left shoulder, not elsewhere classified     Problem List Patient Active Problem List   Diagnosis Date Noted  . Complete rotator cuff tear of left shoulder 07/05/2015  . Tennis elbow 05/30/2014  . Rotator cuff (capsule) sprain 03/10/2014  . Right shoulder pain 02/11/2014  . Memory difficulty 12/18/2012  . Gynecomastia, male 07/30/2012  . OSA (obstructive sleep apnea) 04/23/2011  . EXTERNAL HEMORRHOIDS 02/12/2011  . Prostate cancer ([place]) 11/12/2010  . PLANTAR FASCIITIS 04/09/2010  . Hypothyroidism 12/08/2009  . NEOPLASM OF UNCERTAIN BEHAVIOR OF SKIN 03/10/2009  . OSTEOARTHRITIS 02/14/2009  . HYPERLIPIDEMIA 02/08/2009  . HEARING DEFICIT 02/08/2009  . Essential hypertension 02/08/2009  . ALLERGIC RHINITIS 02/08/2009  . GERD 02/08/2009  . PROTEINURIA 02/08/2009  . DIVERTICULITIS, HX OF 02/08/2009       Laureen Abrahams, PT, DPT 07/18/16 10:23 AM    [facility] [address]  [place], [place], [address] Phone: [phone]   Fax:  [phone]  Name: Zachary [name] MRN: [id number] Date of Birth: 13-Sep-1951

## 2016-07-22 ENCOUNTER — Ambulatory Visit: Payer: 59

## 2016-07-22 DIAGNOSIS — M25512 Pain in left shoulder: Secondary | ICD-10-CM | POA: Diagnosis not present

## 2016-07-22 DIAGNOSIS — R29898 Other symptoms and signs involving the musculoskeletal system: Secondary | ICD-10-CM | POA: Diagnosis not present

## 2016-07-22 DIAGNOSIS — M25612 Stiffness of left shoulder, not elsewhere classified: Secondary | ICD-10-CM

## 2016-07-22 NOTE — Therapy (Signed)
[facility] [address]  [place], [place], [address] Phone: [phone]   Fax:  [phone]  Physical Therapy Treatment  Patient Details  Name: Zachary [name] MRN: [id number] Date of Birth: 05/24/1951 Referring Provider: Dr. Corky Mull  Encounter Date: 07/22/2016      PT End of Session - 07/22/16 0946    Visit Number 8   Number of Visits 16   Date for PT Re-Evaluation 08/22/16   PT Start Time 0935   PT Stop Time 1030   PT Time Calculation (min) 55 min   Activity Tolerance Patient tolerated treatment well   Behavior During Therapy [facility] for tasks assessed/performed      Past Medical History:  Diagnosis Date  . Arthritis   . Articular cartilage disease    left shoulder  . Cancer ([place]) 2015   positive prostate cancer bx  . Diverticulitis   . GERD (gastroesophageal reflux disease)   . Hearing problem    hearing deficit  . Hemorrhoids   . Hyperlipidemia   . Hypertension   . Hypothyroidism   . Insomnia   . Sleep apnea    uses a cpap  . Wears glasses   . Wears hearing aid    both ears    Past Surgical History:  Procedure Laterality Date  . BACK SURGERY  1978   herniated disksurgery  . CARDIAC CATHETERIZATION  08/01/2010  . COLONOSCOPY    . EXAM UNDER ANESTHESIA WITH MANIPULATION OF SHOULDER Right 09/15/2014   Procedure: RIGHT SHOULDER MANIPULATION UNDER ANESTHESIA;  Surgeon: Ninetta Lights, MD;  Location: [facility];  Service: Orthopedics;  Laterality: Right;  . HEMORRHOID SURGERY  08/2006  . SHOULDER ARTHROSCOPY WITH DISTAL CLAVICLE RESECTION Left 07/07/2015   Procedure: SHOULDER ARTHROSCOPY WITH DISTAL CLAVICLE RESECTION;  Surgeon: Kathryne Hitch, MD;  Location: [facility];  Service: Orthopedics;  Laterality: Left;  . SHOULDER ARTHROSCOPY WITH ROTATOR CUFF REPAIR Left 12/07/2015   Procedure: LEFT SHOULDER ARTHROSCOPY DEBRIDEMENT, WITH ROTATOR CUFF REPAIR;  Surgeon:  Ninetta Lights, MD;  Location: [facility];  Service: Orthopedics;  Laterality: Left;  . SHOULDER ARTHROSCOPY WITH ROTATOR CUFF REPAIR AND SUBACROMIAL DECOMPRESSION Left 07/07/2015   Procedure: LEFT SHOULDER SCOPE DEBRIDEMENT, SUBACROMIAL DECOMPRESSION, DISTAL CLAVICULECTOMY, ROTATOR CUFF REPAIR  ;  Surgeon: Kathryne Hitch, MD;  Location: [facility];  Service: Orthopedics;  Laterality: Left;  ANESTHESIA: GENERAL, PRE/POST OP SCALENE  . SHOULDER ARTHROSCOPY WITH SUBACROMIAL DECOMPRESSION, ROTATOR CUFF REPAIR AND BICEP TENDON REPAIR Right 03/10/2014   Procedure: RIGHT SHOULDER ARTHROSCOPY WITH SUBACROMIAL DECOMPRESSION, PARTIAL ACROMIOPLASTY WITH CORACOAROMIAL LABRUM DEBRIDEMENT RELEASE DISTAL CLAVICULECTOMY,  ROTATOR CUFF REPAIR AND EXTENSIVE DEBRIDEMENT;  Surgeon: Ninetta Lights, MD;  Location: [facility];  Service: Orthopedics;  Laterality: Right;  . TENDON REPAIR  June 06, 2011   right elbow, Dr. Percell Miller    There were no vitals filed for this visit.      Subjective Assessment - 07/22/16 0950    Subjective Pt. reports his initial L shoulder pain today as a 3/10; pt. reports he has been performing updated HEP consistently.     Patient Stated Goals return to work and full recovery   Currently in Pain? Yes   Pain Score 3    Pain Location Shoulder   Pain Orientation Left   Pain Descriptors / Indicators Sharp   Pain Type Surgical pain   Pain Onset More than a month ago  Today's Treatment:  Manual: L shoulder PROM all directions  Therex: Seated L shoulder AAROM with red p-ball (75cm)  x 10 reps Supine L shoulder AROM IR/ER x 10 reps each way Standing pulleys L shoulder flexion x 2 min   UBE (initiated today): 1.0 level, 2 min forwards/1 min backwards; pt. With limited tolerance for backwards movement; pt. Instructed to do most of "work" with R shoulder  R sidelying L shoulder flexion rolls on peanut p-ball 2 x 10 reps each way; 2nd set with  increased flexion ROM with p-ball  Supine L shoulder AROM IR/ER x 10 reps each way  Vasoneumatic Device to L shoulder: Seated L shoulder in 30 dg abd, low compression, coldest temp., 10 min          PT Short Term Goals - 07/01/16 0858      PT SHORT TERM GOAL #1   Title L shoulder PROM Flexion to 120, ER to 60 at 90 ABD, and IR to 45 at 90 ABD by 07/25/16   Time 4   Period Weeks   Status On-going  07/01/16:  L shoulder PROM: IR 56, ER 18 dg, 78 ABD, 100dg flexion            PT Long Term Goals - 07/01/16 [id number]      PT LONG TERM GOAL #1   Title Full PROM in order to progress into Phase II rehab (08/22/16)   Time 8   Period Weeks   Status On-going     PT LONG TERM GOAL #2   Title L shoulder AROM: flexion 120; abduction at least 90; and ir/er at least 60 without substitution for progression into phase II (08/22/16)   Time 8   Period Weeks   Status On-going     PT LONG TERM GOAL #3   Title n/a     PT LONG TERM GOAL #4   Title n/a               Plan - 07/22/16 0946    Clinical Impression Statement Pt. reports his initial L shoulder pain today as a 3/10; this pain rose up to 7/10 pain with backwards UBE activity however quickly lowered to 3/10 following.  Pt. reports he has been performing updated HEP consistently over the weekend.  Pt. tolerated increased repetitions with L shoulder AROM activities today well with initiation of UBE; pt. pain increased with all AROM activities however quickly lowered to baseline following.  pt. progresing well at this point.       PT Treatment/Interventions ADLs/Self Care Home Management;Cryotherapy;Electrical Stimulation;Moist Heat;Ultrasound;Patient/family education;Neuromuscular re-education;Therapeutic exercise;Therapeutic activities;Functional mobility training;Manual techniques;Dry needling;Taping;Passive range of motion;Vasopneumatic Device   PT Next Visit Plan follow protocol; slowly increasing AROM to tolerance      Patient will  benefit from skilled therapeutic intervention in order to improve the following deficits and impairments:  Postural dysfunction, Decreased strength, Impaired UE functional use, Pain, Decreased range of motion, Decreased scar mobility  Visit Diagnosis: Pain in left shoulder  Stiffness of left shoulder, not elsewhere classified     Problem List Patient Active Problem List   Diagnosis Date Noted  . Complete rotator cuff tear of left shoulder 07/05/2015  . Tennis elbow 05/30/2014  . Rotator cuff (capsule) sprain 03/10/2014  . Right shoulder pain 02/11/2014  . Memory difficulty 12/18/2012  . Gynecomastia, male 07/30/2012  . OSA (obstructive sleep apnea) 04/23/2011  . EXTERNAL HEMORRHOIDS 02/12/2011  . Prostate cancer ([place]) 11/12/2010  . PLANTAR FASCIITIS 04/09/2010  .  Hypothyroidism 12/08/2009  . NEOPLASM OF UNCERTAIN BEHAVIOR OF SKIN 03/10/2009  . OSTEOARTHRITIS 02/14/2009  . HYPERLIPIDEMIA 02/08/2009  . HEARING DEFICIT 02/08/2009  . Essential hypertension 02/08/2009  . ALLERGIC RHINITIS 02/08/2009  . GERD 02/08/2009  . PROTEINURIA 02/08/2009  . DIVERTICULITIS, HX OF 02/08/2009    Bess Harvest, PTA 07/22/2016, 1:12 PM  [facility] [address]  [place], [place], [address] Phone: [phone]   Fax:  [phone]  Name: Zachary [name] MRN: [id number] Date of Birth: January 04, 1951

## 2016-07-24 ENCOUNTER — Ambulatory Visit: Payer: 59 | Attending: Sports Medicine

## 2016-07-24 DIAGNOSIS — R29898 Other symptoms and signs involving the musculoskeletal system: Secondary | ICD-10-CM | POA: Insufficient documentation

## 2016-07-24 DIAGNOSIS — M25612 Stiffness of left shoulder, not elsewhere classified: Secondary | ICD-10-CM | POA: Diagnosis not present

## 2016-07-24 DIAGNOSIS — M25512 Pain in left shoulder: Secondary | ICD-10-CM | POA: Insufficient documentation

## 2016-07-24 MED FILL — SODIUM BICARB 10 GRAIN TAB: 650 | 30 days supply | Qty: 120 | Fill #3

## 2016-07-24 NOTE — Therapy (Signed)
[facility] [address]  [place], [place], [address] Phone: [phone]   Fax:  [phone]  Physical Therapy Treatment  Patient Details  Name: Zachary [name] MRN: [id number] Date of Birth: 06-22-1951 Referring Provider: Dr. Corky Mull  Encounter Date: 07/24/2016      PT End of Session - 07/24/16 0951    Visit Number 9   Number of Visits 16   Date for PT Re-Evaluation 08/22/16   PT Start Time 0935   PT Stop Time 1030   PT Time Calculation (min) 55 min   Activity Tolerance Patient tolerated treatment well   Behavior During Therapy [facility] for tasks assessed/performed      Past Medical History:  Diagnosis Date  . Arthritis   . Articular cartilage disease    left shoulder  . Cancer ([place]) 2015   positive prostate cancer bx  . Diverticulitis   . GERD (gastroesophageal reflux disease)   . Hearing problem    hearing deficit  . Hemorrhoids   . Hyperlipidemia   . Hypertension   . Hypothyroidism   . Insomnia   . Sleep apnea    uses a cpap  . Wears glasses   . Wears hearing aid    both ears    Past Surgical History:  Procedure Laterality Date  . BACK SURGERY  1978   herniated disksurgery  . CARDIAC CATHETERIZATION  08/01/2010  . COLONOSCOPY    . EXAM UNDER ANESTHESIA WITH MANIPULATION OF SHOULDER Right 09/15/2014   Procedure: RIGHT SHOULDER MANIPULATION UNDER ANESTHESIA;  Surgeon: Ninetta Lights, MD;  Location: [facility];  Service: Orthopedics;  Laterality: Right;  . HEMORRHOID SURGERY  08/2006  . SHOULDER ARTHROSCOPY WITH DISTAL CLAVICLE RESECTION Left 07/07/2015   Procedure: SHOULDER ARTHROSCOPY WITH DISTAL CLAVICLE RESECTION;  Surgeon: Kathryne Hitch, MD;  Location: [facility];  Service: Orthopedics;  Laterality: Left;  . SHOULDER ARTHROSCOPY WITH ROTATOR CUFF REPAIR Left 12/07/2015   Procedure: LEFT SHOULDER ARTHROSCOPY DEBRIDEMENT, WITH ROTATOR CUFF REPAIR;  Surgeon:  Ninetta Lights, MD;  Location: [facility];  Service: Orthopedics;  Laterality: Left;  . SHOULDER ARTHROSCOPY WITH ROTATOR CUFF REPAIR AND SUBACROMIAL DECOMPRESSION Left 07/07/2015   Procedure: LEFT SHOULDER SCOPE DEBRIDEMENT, SUBACROMIAL DECOMPRESSION, DISTAL CLAVICULECTOMY, ROTATOR CUFF REPAIR  ;  Surgeon: Kathryne Hitch, MD;  Location: [facility];  Service: Orthopedics;  Laterality: Left;  ANESTHESIA: GENERAL, PRE/POST OP SCALENE  . SHOULDER ARTHROSCOPY WITH SUBACROMIAL DECOMPRESSION, ROTATOR CUFF REPAIR AND BICEP TENDON REPAIR Right 03/10/2014   Procedure: RIGHT SHOULDER ARTHROSCOPY WITH SUBACROMIAL DECOMPRESSION, PARTIAL ACROMIOPLASTY WITH CORACOAROMIAL LABRUM DEBRIDEMENT RELEASE DISTAL CLAVICULECTOMY,  ROTATOR CUFF REPAIR AND EXTENSIVE DEBRIDEMENT;  Surgeon: Ninetta Lights, MD;  Location: [facility];  Service: Orthopedics;  Laterality: Right;  . TENDON REPAIR  June 06, 2011   right elbow, Dr. Percell Miller    There were no vitals filed for this visit.      Subjective Assessment - 07/24/16 0943    Subjective Pt. reports his L shoulder has been having a burning sensation and 8/10 pain since last treatment; pt. reports he, "tossed and turned" much of last night getting very poor sleep.     Patient Stated Goals return to work and full recovery   Currently in Pain? Yes   Pain Score 8    Pain Location Shoulder   Pain Orientation Left   Pain Descriptors / Indicators Burning   Pain Type Surgical pain  Pain Onset More than a month ago   Pain Frequency Intermittent   Multiple Pain Sites No            [facility] PT Assessment - 07/24/16 0001      PROM   Left Shoulder Flexion 93 Degrees  Pt. with significant pain today and guarded.   Left Shoulder ABduction 79 Degrees   Left Shoulder Internal Rotation 72 Degrees   Left Shoulder External Rotation 47 Degrees      Today's treatment:  * pt. With 8/10 burning L anterior shoulder pain reported initially  today thus pain relief / conservative approach taken; no AAROM/AROM performed.    Modalities: Moist Heat to L shoulder: Supine, 10 min  E-stim IFC: supine, intensity to pt. tolerance, 80-150 Hz, 10 min  Manual: L shoulder PROM all directions R sidelying L scapular mobs all directions L UT STM in supine   Vasoneumatic Device to L shoulder: seated with L shoulder in 30 dg abd, coldest temp., low compression, 15 min        PT Short Term Goals - 07/24/16 1003      PT SHORT TERM GOAL #1   Title L shoulder PROM Flexion to 120, ER to 60 at 90 ABD, and IR to 45 at 90 ABD by 07/25/16   Time 4   Period Weeks   Status On-going  07/01/16:  L shoulder PROM: IR 56, ER 18 dg, 78 ABD, 100dg flexion            PT Long Term Goals - 07/01/16 [id number]      PT LONG TERM GOAL #1   Title Full PROM in order to progress into Phase II rehab (08/22/16)   Time 8   Period Weeks   Status On-going     PT LONG TERM GOAL #2   Title L shoulder AROM: flexion 120; abduction at least 90; and ir/er at least 60 without substitution for progression into phase II (08/22/16)   Time 8   Period Weeks   Status On-going     PT LONG TERM GOAL #3   Title n/a     PT LONG TERM GOAL #4   Title n/a               Plan - 07/24/16 0951    Clinical Impression Statement Pt. continues to demo L shoulder guarding and 8/10 initial pain today with description of, "burning" pain over the last few days since previous treatment; pt. current status discussed with primary PT. During discussion, primary PT realized a different RTC protocol was being referenced by PTA and primary PT provided correct protocol and reviewed current stage of protocol guidelines with PTA.  Modalities / pain relief were the focus on today's session with L shoulder pain decrease to 5/10 following moist heat / E-stim combo.  Plan to continue monitoring pt. status over next treatments.     PT Treatment/Interventions ADLs/Self Care Home  Management;Cryotherapy;Electrical Stimulation;Moist Heat;Ultrasound;Patient/family education;Neuromuscular re-education;Therapeutic exercise;Therapeutic activities;Functional mobility training;Manual techniques;Dry needling;Taping;Passive range of motion;Vasopneumatic Device   PT Next Visit Plan follow protocol; slowly increasing AROM to tolerance      Patient will benefit from skilled therapeutic intervention in order to improve the following deficits and impairments:  Postural dysfunction, Decreased strength, Impaired UE functional use, Pain, Decreased range of motion, Decreased scar mobility  Visit Diagnosis: Pain in left shoulder  Stiffness of left shoulder, not elsewhere classified     Problem List Patient Active Problem List   Diagnosis Date Noted  .  Complete rotator cuff tear of left shoulder 07/05/2015  . Tennis elbow 05/30/2014  . Rotator cuff (capsule) sprain 03/10/2014  . Right shoulder pain 02/11/2014  . Memory difficulty 12/18/2012  . Gynecomastia, male 07/30/2012  . OSA (obstructive sleep apnea) 04/23/2011  . EXTERNAL HEMORRHOIDS 02/12/2011  . Prostate cancer ([place]) 11/12/2010  . PLANTAR FASCIITIS 04/09/2010  . Hypothyroidism 12/08/2009  . NEOPLASM OF UNCERTAIN BEHAVIOR OF SKIN 03/10/2009  . OSTEOARTHRITIS 02/14/2009  . HYPERLIPIDEMIA 02/08/2009  . HEARING DEFICIT 02/08/2009  . Essential hypertension 02/08/2009  . ALLERGIC RHINITIS 02/08/2009  . GERD 02/08/2009  . PROTEINURIA 02/08/2009  . DIVERTICULITIS, HX OF 02/08/2009    Bess Harvest, PTA 07/24/2016, 12:53 PM  [facility] [address]  [place], [place], [address] Phone: [phone]   Fax:  [phone]  Name: DORSEY HAROLDSON MRN: [id number] Date of Birth: [birth year]

## 2016-07-25 ENCOUNTER — Ambulatory Visit (INDEPENDENT_AMBULATORY_CARE_PROVIDER_SITE_OTHER): Payer: 59 | Admitting: Family Medicine

## 2016-07-25 ENCOUNTER — Encounter: Payer: Self-pay | Admitting: Family Medicine

## 2016-07-25 VITALS — BP 120/77 | HR 84 | Temp 98.4°F | Wt 203.0 lb

## 2016-07-25 DIAGNOSIS — J209 Acute bronchitis, unspecified: Secondary | ICD-10-CM | POA: Diagnosis not present

## 2016-07-25 MED ORDER — IPRATROPIUM BROMIDE 0.06 % NA SOLN: 2.0000 | NASAL | 6 refills | Status: DC | PRN

## 2016-07-25 MED ORDER — AZITHROMYCIN 250 MG PO TABS: 250.0000 mg | ORAL_TABLET | Freq: Every day | ORAL | 0 refills | Status: DC

## 2016-07-25 MED ORDER — PREDNISONE 10 MG PO TABS: 30.0000 mg | ORAL_TABLET | Freq: Every day | ORAL | 0 refills | Status: DC

## 2016-07-25 MED FILL — predniSONE 10 MG TABS: 10 | 5 days supply | Qty: 15 | Fill #0

## 2016-07-25 MED FILL — IPRATROPIUM 0.06% SPRAY: 0.06 | 30 days supply | Qty: 15 | Fill #0

## 2016-07-25 MED FILL — AZITHROMYCIN 250 MG TABLET: 250 | 5 days supply | Qty: 6 | Fill #0

## 2016-07-25 NOTE — Progress Notes (Signed)
Zachary [name] is a 65 y.o. male who presents to [facility] today for Cough congestion and runny nose. Symptoms present for a few days. No fevers chills vomiting or diarrhea. Over-the-counter treatments have been mildly helpful. His wife is sick with a similar illness.    Past Medical History:  Diagnosis Date  . Arthritis   . Articular cartilage disease    left shoulder  . Cancer ([place]) 2015   positive prostate cancer bx  . Diverticulitis   . GERD (gastroesophageal reflux disease)   . Hearing problem    hearing deficit  . Hemorrhoids   . Hyperlipidemia   . Hypertension   . Hypothyroidism   . Insomnia   . Sleep apnea    uses a cpap  . Wears glasses   . Wears hearing aid    both ears   Past Surgical History:  Procedure Laterality Date  . BACK SURGERY  1978   herniated disksurgery  . CARDIAC CATHETERIZATION  08/01/2010  . COLONOSCOPY    . EXAM UNDER ANESTHESIA WITH MANIPULATION OF SHOULDER Right 09/15/2014   Procedure: RIGHT SHOULDER MANIPULATION UNDER ANESTHESIA;  Surgeon: Ninetta Lights, MD;  Location: [facility];  Service: Orthopedics;  Laterality: Right;  . HEMORRHOID SURGERY  08/2006  . SHOULDER ARTHROSCOPY WITH DISTAL CLAVICLE RESECTION Left 07/07/2015   Procedure: SHOULDER ARTHROSCOPY WITH DISTAL CLAVICLE RESECTION;  Surgeon: Kathryne Hitch, MD;  Location: [facility];  Service: Orthopedics;  Laterality: Left;  . SHOULDER ARTHROSCOPY WITH ROTATOR CUFF REPAIR Left 12/07/2015   Procedure: LEFT SHOULDER ARTHROSCOPY DEBRIDEMENT, WITH ROTATOR CUFF REPAIR;  Surgeon: Ninetta Lights, MD;  Location: [facility];  Service: Orthopedics;  Laterality: Left;  . SHOULDER ARTHROSCOPY WITH ROTATOR CUFF REPAIR AND SUBACROMIAL DECOMPRESSION Left 07/07/2015   Procedure: LEFT SHOULDER SCOPE DEBRIDEMENT, SUBACROMIAL DECOMPRESSION,  DISTAL CLAVICULECTOMY, ROTATOR CUFF REPAIR  ;  Surgeon: Kathryne Hitch, MD;  Location: [facility];  Service: Orthopedics;  Laterality: Left;  ANESTHESIA: GENERAL, PRE/POST OP SCALENE  . SHOULDER ARTHROSCOPY WITH SUBACROMIAL DECOMPRESSION, ROTATOR CUFF REPAIR AND BICEP TENDON REPAIR Right 03/10/2014   Procedure: RIGHT SHOULDER ARTHROSCOPY WITH SUBACROMIAL DECOMPRESSION, PARTIAL ACROMIOPLASTY WITH CORACOAROMIAL LABRUM DEBRIDEMENT RELEASE DISTAL CLAVICULECTOMY,  ROTATOR CUFF REPAIR AND EXTENSIVE DEBRIDEMENT;  Surgeon: Ninetta Lights, MD;  Location: [facility];  Service: Orthopedics;  Laterality: Right;  . TENDON REPAIR  June 06, 2011   right elbow, Dr. Percell Miller   Social History  Substance Use Topics  . Smoking status: Never Smoker  . Smokeless tobacco: Never Used  . Alcohol use Yes     Comment: occasional   family history includes Alcohol abuse in his other; Arthritis in his other; Breast cancer in his mother; Cancer in his brother and other; Coronary artery disease in his other; Cystic fibrosis in his other; Heart disease in his father; Irritable bowel syndrome in his other.  ROS as above:  Medications: Current Outpatient Prescriptions  Medication Sig Dispense Refill  . acetaminophen (TYLENOL) 500 MG tablet Take 500 mg by mouth every 6 (six) hours as needed.    Marland Kitchen aspirin EC 81 MG EC tablet Take 81 mg by mouth daily.      Marland Kitchen azithromycin (ZITHROMAX) 250 MG tablet Take 1 tablet (250 mg total) by mouth daily. Take first 2 tablets together, then 1 every day until finished. 6 tablet 0  . HYDROmorphone (DILAUDID) 2 MG tablet Take 1-2 tabs  po q4-6 hours prn pain 60 tablet 0  . ipratropium (ATROVENT) 0.06 % nasal spray Place 2 sprays into both nostrils every 4 (four) hours as needed for rhinitis. 10 mL 6  . levothyroxine (SYNTHROID, LEVOTHROID) 100 MCG tablet TAKE 1 TABLET (100 MCG TOTAL) BY MOUTH DAILY. 90 tablet 1  . lisinopril (PRINIVIL,ZESTRIL) 5 MG tablet Take 1 tablet  (5 mg total) by mouth daily. 90 tablet 1  . loratadine (CLARITIN) 10 MG tablet Take 10 mg by mouth daily.      . naproxen sodium (ANAPROX) 220 MG tablet Take 220 mg by mouth 2 (two) times daily with a meal. Reported on 06/27/2016    . omeprazole (PRILOSEC) 40 MG capsule Take 1 capsule (40 mg total) by mouth daily. 90 capsule 3  . pravastatin (PRAVACHOL) 20 MG tablet Take 1 tablet (20 mg total) by mouth daily. (Patient not taking: Reported on 06/27/2016) 90 tablet 3  . pravastatin (PRAVACHOL) 40 MG tablet Take 0.5 tablets (20 mg total) by mouth daily. NEED FOLLOW UP VISIT IN September. 45 tablet 2  . predniSONE (DELTASONE) 10 MG tablet Take 3 tablets (30 mg total) by mouth daily with breakfast. 15 tablet 0  . zolpidem (AMBIEN) 10 MG tablet Take 0.5-1 tablets (5-10 mg total) by mouth at bedtime as needed for sleep. 30 tablet 1   No current facility-administered medications for this visit.    Allergies  Allergen Reactions  . Oxycodone Itching  . Simvastatin     REACTION: muscle pain     Exam:  BP 120/77   Pulse 84   Temp 98.4 F (36.9 C) (Oral)   Wt 203 lb (92.1 kg)   BMI 30.87 kg/m  Gen: Well NAD HEENT: EOMI,  MMM clear nasal discharge. Posterior pharynx with cobblestoning. Lungs: Normal work of breathing. CTABL Heart: RRR no MRG Abd: NABS, Soft. Nondistended, Nontender Exts: Brisk capillary refill, warm and well perfused.   No results found for this or any previous visit (from the past 24 hour(s)). No results found.    Assessment and Plan: 65 y.o. male with bronchitis. Treatment with Atrovent nasal spray azithromycin and prednisone. Return as needed.   No orders of the defined types were placed in this encounter.   Discussed warning signs or symptoms. Please see discharge instructions. Patient expresses understanding.

## 2016-07-25 NOTE — Patient Instructions (Signed)
Thank you for coming in today. Take prednisone use Atrovent and azithromycin antibiotics needed. Call or go to the emergency room if you get worse, have trouble breathing, have chest pains, or palpitations.    Acute Bronchitis Bronchitis is inflammation of the airways that extend from the windpipe into the lungs (bronchi). The inflammation often causes mucus to develop. This leads to a cough, which is the most common symptom of bronchitis.  In acute bronchitis, the condition usually develops suddenly and goes away over time, usually in a couple weeks. Smoking, allergies, and asthma can make bronchitis worse. Repeated episodes of bronchitis may cause further lung problems.  CAUSES Acute bronchitis is most often caused by the same virus that causes a cold. The virus can spread from person to person (contagious) through coughing, sneezing, and touching contaminated objects. SIGNS AND SYMPTOMS   Cough.   Fever.   Coughing up mucus.   Body aches.   Chest congestion.   Chills.   Shortness of breath.   Sore throat.  DIAGNOSIS  Acute bronchitis is usually diagnosed through a physical exam. Your health care provider will also ask you questions about your medical history. Tests, such as chest X-rays, are sometimes done to rule out other conditions.  TREATMENT  Acute bronchitis usually goes away in a couple weeks. Oftentimes, no medical treatment is necessary. Medicines are sometimes given for relief of fever or cough. Antibiotic medicines are usually not needed but may be prescribed in certain situations. In some cases, an inhaler may be recommended to help reduce shortness of breath and control the cough. A cool mist vaporizer may also be used to help thin bronchial secretions and make it easier to clear the chest.  HOME CARE INSTRUCTIONS  Get plenty of rest.   Drink enough fluids to keep your urine clear or pale yellow (unless you have a medical condition that requires fluid  restriction). Increasing fluids may help thin your respiratory secretions (sputum) and reduce chest congestion, and it will prevent dehydration.   Take medicines only as directed by your health care provider.  If you were prescribed an antibiotic medicine, finish it all even if you start to feel better.  Avoid smoking and secondhand smoke. Exposure to cigarette smoke or irritating chemicals will make bronchitis worse. If you are a smoker, consider using nicotine gum or skin patches to help control withdrawal symptoms. Quitting smoking will help your lungs heal faster.   Reduce the chances of another bout of acute bronchitis by washing your hands frequently, avoiding people with cold symptoms, and trying not to touch your hands to your mouth, nose, or eyes.   Keep all follow-up visits as directed by your health care provider.  SEEK MEDICAL CARE IF: Your symptoms do not improve after 1 week of treatment.  SEEK IMMEDIATE MEDICAL CARE IF:  You develop an increased fever or chills.   You have chest pain.   You have severe shortness of breath.  You have bloody sputum.   You develop dehydration.  You faint or repeatedly feel like you are going to pass out.  You develop repeated vomiting.  You develop a severe headache. MAKE SURE YOU:   Understand these instructions.  Will watch your condition.  Will get help right away if you are not doing well or get worse.   This information is not intended to replace advice given to you by your health care provider. Make sure you discuss any questions you have with your health care provider.  Document Released: 01/16/2005 Document Revised: 12/30/2014 Document Reviewed: 06/01/2013 Elsevier Interactive Patient Education [employer].

## 2016-07-29 ENCOUNTER — Ambulatory Visit: Payer: 59

## 2016-07-31 ENCOUNTER — Ambulatory Visit: Payer: 59

## 2016-07-31 DIAGNOSIS — M25612 Stiffness of left shoulder, not elsewhere classified: Secondary | ICD-10-CM

## 2016-07-31 DIAGNOSIS — R29898 Other symptoms and signs involving the musculoskeletal system: Secondary | ICD-10-CM | POA: Diagnosis not present

## 2016-07-31 DIAGNOSIS — M25512 Pain in left shoulder: Secondary | ICD-10-CM

## 2016-07-31 NOTE — Therapy (Signed)
[facility] [address]  [place], [place], [address] Phone: [phone]   Fax:  [phone]  Physical Therapy Treatment  Patient Details  Name: Zachary [name] MRN: [id number] Date of Birth: [birth year] Referring Provider: Dr. Corky Mull  Encounter Date: 07/31/2016      PT End of Session - 07/31/16 0938    Visit Number 10   Number of Visits 16   Date for PT Re-Evaluation 08/22/16   PT Start Time 0920   PT Stop Time 1001   PT Time Calculation (min) 41 min   Activity Tolerance Patient tolerated treatment well   Behavior During Therapy [facility] for tasks assessed/performed      Past Medical History:  Diagnosis Date  . Arthritis   . Articular cartilage disease    left shoulder  . Cancer ([place]) 2015   positive prostate cancer bx  . Diverticulitis   . GERD (gastroesophageal reflux disease)   . Hearing problem    hearing deficit  . Hemorrhoids   . Hyperlipidemia   . Hypertension   . Hypothyroidism   . Insomnia   . Sleep apnea    uses a cpap  . Wears glasses   . Wears hearing aid    both ears    Past Surgical History:  Procedure Laterality Date  . BACK SURGERY  1978   herniated disksurgery  . CARDIAC CATHETERIZATION  08/01/2010  . COLONOSCOPY    . EXAM UNDER ANESTHESIA WITH MANIPULATION OF SHOULDER Right 09/15/2014   Procedure: RIGHT SHOULDER MANIPULATION UNDER ANESTHESIA;  Surgeon: Ninetta Lights, MD;  Location: [place];  Service: Orthopedics;  Laterality: Right;  . HEMORRHOID SURGERY  08/2006  . SHOULDER ARTHROSCOPY WITH DISTAL CLAVICLE RESECTION Left 07/07/2015   Procedure: SHOULDER ARTHROSCOPY WITH DISTAL CLAVICLE RESECTION;  Surgeon: Kathryne Hitch, MD;  Location: [place];  Service: Orthopedics;  Laterality: Left;  . SHOULDER ARTHROSCOPY WITH ROTATOR CUFF REPAIR Left 12/07/2015   Procedure: LEFT SHOULDER ARTHROSCOPY DEBRIDEMENT, WITH ROTATOR CUFF REPAIR;  Surgeon:  Ninetta Lights, MD;  Location: [place];  Service: Orthopedics;  Laterality: Left;  . SHOULDER ARTHROSCOPY WITH ROTATOR CUFF REPAIR AND SUBACROMIAL DECOMPRESSION Left 07/07/2015   Procedure: LEFT SHOULDER SCOPE DEBRIDEMENT, SUBACROMIAL DECOMPRESSION, DISTAL CLAVICULECTOMY, ROTATOR CUFF REPAIR  ;  Surgeon: Kathryne Hitch, MD;  Location: [place];  Service: Orthopedics;  Laterality: Left;  ANESTHESIA: GENERAL, PRE/POST OP SCALENE  . SHOULDER ARTHROSCOPY WITH SUBACROMIAL DECOMPRESSION, ROTATOR CUFF REPAIR AND BICEP TENDON REPAIR Right 03/10/2014   Procedure: RIGHT SHOULDER ARTHROSCOPY WITH SUBACROMIAL DECOMPRESSION, PARTIAL ACROMIOPLASTY WITH CORACOAROMIAL LABRUM DEBRIDEMENT RELEASE DISTAL CLAVICULECTOMY,  ROTATOR CUFF REPAIR AND EXTENSIVE DEBRIDEMENT;  Surgeon: Ninetta Lights, MD;  Location: [place];  Service: Orthopedics;  Laterality: Right;  . TENDON REPAIR  June 06, 2011   right elbow, Dr. Percell Miller    There were no vitals filed for this visit.      Subjective Assessment - 07/31/16 0936    Subjective Pt. reports an initial L shoulder pain of 3/10 today reporting that the burning is now gone and only a dull ache remains initially todya.     Patient Stated Goals return to work and full recovery   Currently in Pain? Yes   Pain Score 3    Pain Location Shoulder   Pain Orientation Left   Pain Descriptors / Indicators Aching;Dull   Pain Type Surgical pain   Pain Onset More than  a month ago   Pain Frequency Intermittent   Pain Relieving Factors stabilization   Multiple Pain Sites No      Today's treatment:   Manual: L shoulder PROM all directions R sidelying L scapular mobs all directions L UT STM in supine   Therex: Seated L shoulder AAROM flexion on 75cm red p-ball x 15 reps; pt. pain free with this movement; pt. Instructed to avoid all L shoulder active movement with this  Seated L shoulder AAROM IR/ER with wand x 15 reps; pt. pain  free with this movement; pt. Instructed to avoid all L shoulder active movement with this  Seated scapular squeeze 5" x 10 reps   Vasoneumatic Device to L shoulder: seated with L shoulder in 30 dg abd, coldest temp., low compression, 15 min  L shoulder ROM testing   Goal testing      [facility] PT Assessment - 07/31/16 0951      PROM   Left Shoulder Flexion 104 Degrees   Left Shoulder ABduction 82 Degrees   Left Shoulder Internal Rotation 58 Degrees  measure at 90 dg abd   Left Shoulder External Rotation 60 Degrees  measure at 90 dg abd             PT Short Term Goals - 07/31/16 0945      PT SHORT TERM GOAL #1   Title L shoulder PROM Flexion to 120, ER to 60 at 90 ABD, and IR to 45 at 90 ABD by 07/25/16   Time 4   Period Weeks   Status On-going  8.9.17:  L shoulder PROM: IR 58 dg (at 90 dg abd), ER 60 dg (at 90 dg abd), 82 ABD, 104 dg flexion            PT Long Term Goals - 07/01/16 ID:[id number]      PT LONG TERM GOAL #1   Title Full PROM in order to progress into Phase II rehab (08/22/16)   Time 8   Period Weeks   Status On-going     PT LONG TERM GOAL #2   Title L shoulder AROM: flexion 120; abduction at least 90; and ir/er at least 60 without substitution for progression into phase II (08/22/16)   Time 8   Period Weeks   Status On-going     PT LONG TERM GOAL #3   Title n/a     PT LONG TERM GOAL #4   Title n/a               Plan - 07/31/16 0939    Clinical Impression Statement Pt. reports an initial L shoulder dull ache of 3/10 today reporting that the burning is now gone.  L shoulder PROM at 104 dg flex, 82dg abd, 58 dg IR (measured at 90 dg abd), and 60 dg ER (measured at 90 dg abd) today. Pt. remained unguarded with L shoulder activities today, able to perform L shoulder AAROM flexion on p-ball without pain.  Pt. progressing with protocol well at this point; all therex to pt. tolerance.  Pt. to see PT on 8/14.     PT Treatment/Interventions ADLs/Self Care  Home Management;Cryotherapy;Electrical Stimulation;Moist Heat;Ultrasound;Patient/family education;Neuromuscular re-education;Therapeutic exercise;Therapeutic activities;Functional mobility training;Manual techniques;Dry needling;Taping;Passive range of motion;Vasopneumatic Device   PT Next Visit Plan follow protocol; slowly increasing AROM to tolerance      Patient will benefit from skilled therapeutic intervention in order to improve the following deficits and impairments:  Postural dysfunction, Decreased strength, Impaired UE functional use, Pain,  Decreased range of motion, Decreased scar mobility  Visit Diagnosis: Pain in left shoulder  Stiffness of left shoulder, not elsewhere classified     Problem List Patient Active Problem List   Diagnosis Date Noted  . Complete rotator cuff tear of left shoulder 07/05/2015  . Tennis elbow 05/30/2014  . Rotator cuff (capsule) sprain 03/10/2014  . Right shoulder pain 02/11/2014  . Memory difficulty 12/18/2012  . Gynecomastia, male 07/30/2012  . OSA (obstructive sleep apnea) 04/23/2011  . EXTERNAL HEMORRHOIDS 02/12/2011  . Prostate cancer ([place]) 11/12/2010  . PLANTAR FASCIITIS 04/09/2010  . Hypothyroidism 12/08/2009  . NEOPLASM OF UNCERTAIN BEHAVIOR OF SKIN 03/10/2009  . OSTEOARTHRITIS 02/14/2009  . HYPERLIPIDEMIA 02/08/2009  . HEARING DEFICIT 02/08/2009  . Essential hypertension 02/08/2009  . ALLERGIC RHINITIS 02/08/2009  . GERD 02/08/2009  . PROTEINURIA 02/08/2009  . DIVERTICULITIS, HX OF 02/08/2009    Bess Harvest, PTA  07/31/2016, 10:31 AM  [facility] [address]  [place], [place], [address] Phone: [phone]   Fax:  [phone]  Name: Zachary [name] MRN: [id number] Date of Birth: [birth year]

## 2016-08-01 ENCOUNTER — Other Ambulatory Visit: Payer: Self-pay | Admitting: Family Medicine

## 2016-08-01 MED FILL — OMEPRAZOLE DR 40 MG CAPSULE: 40 | 30 days supply | Qty: 30 | Fill #0

## 2016-08-05 ENCOUNTER — Ambulatory Visit: Payer: 59 | Admitting: Physical Therapy

## 2016-08-05 DIAGNOSIS — M25512 Pain in left shoulder: Secondary | ICD-10-CM | POA: Diagnosis not present

## 2016-08-05 DIAGNOSIS — R29898 Other symptoms and signs involving the musculoskeletal system: Secondary | ICD-10-CM | POA: Diagnosis not present

## 2016-08-05 DIAGNOSIS — M25612 Stiffness of left shoulder, not elsewhere classified: Secondary | ICD-10-CM | POA: Diagnosis not present

## 2016-08-05 NOTE — Therapy (Signed)
[facility] [address]  [place], [place], [address] Phone: [phone]   Fax:  [phone]  Physical Therapy Treatment  Patient Details  Name: Zachary [name] MRN: [id number] Date of Birth: 12-24-1950 Referring Provider: Dr. Corky Mull  Encounter Date: 08/05/2016      PT End of Session - 08/05/16 1011    Visit Number 11   Number of Visits 16   Date for PT Re-Evaluation 08/22/16   PT Start Time 0930   PT Stop Time 1026   PT Time Calculation (min) 56 min   Activity Tolerance Patient tolerated treatment well   Behavior During Therapy [facility] for tasks assessed/performed      Past Medical History:  Diagnosis Date  . Arthritis   . Articular cartilage disease    left shoulder  . Cancer ([place]) 2015   positive prostate cancer bx  . Diverticulitis   . GERD (gastroesophageal reflux disease)   . Hearing problem    hearing deficit  . Hemorrhoids   . Hyperlipidemia   . Hypertension   . Hypothyroidism   . Insomnia   . Sleep apnea    uses a cpap  . Wears glasses   . Wears hearing aid    both ears    Past Surgical History:  Procedure Laterality Date  . BACK SURGERY  1978   herniated disksurgery  . CARDIAC CATHETERIZATION  08/01/2010  . COLONOSCOPY    . EXAM UNDER ANESTHESIA WITH MANIPULATION OF SHOULDER Right 09/15/2014   Procedure: RIGHT SHOULDER MANIPULATION UNDER ANESTHESIA;  Surgeon: Ninetta Lights, MD;  Location: [place];  Service: Orthopedics;  Laterality: Right;  . HEMORRHOID SURGERY  08/2006  . SHOULDER ARTHROSCOPY WITH DISTAL CLAVICLE RESECTION Left 07/07/2015   Procedure: SHOULDER ARTHROSCOPY WITH DISTAL CLAVICLE RESECTION;  Surgeon: Kathryne Hitch, MD;  Location: [place];  Service: Orthopedics;  Laterality: Left;  . SHOULDER ARTHROSCOPY WITH ROTATOR CUFF REPAIR Left 12/07/2015   Procedure: LEFT SHOULDER ARTHROSCOPY DEBRIDEMENT, WITH ROTATOR CUFF REPAIR;  Surgeon:  Ninetta Lights, MD;  Location: [place];  Service: Orthopedics;  Laterality: Left;  . SHOULDER ARTHROSCOPY WITH ROTATOR CUFF REPAIR AND SUBACROMIAL DECOMPRESSION Left 07/07/2015   Procedure: LEFT SHOULDER SCOPE DEBRIDEMENT, SUBACROMIAL DECOMPRESSION, DISTAL CLAVICULECTOMY, ROTATOR CUFF REPAIR  ;  Surgeon: Kathryne Hitch, MD;  Location: [place];  Service: Orthopedics;  Laterality: Left;  ANESTHESIA: GENERAL, PRE/POST OP SCALENE  . SHOULDER ARTHROSCOPY WITH SUBACROMIAL DECOMPRESSION, ROTATOR CUFF REPAIR AND BICEP TENDON REPAIR Right 03/10/2014   Procedure: RIGHT SHOULDER ARTHROSCOPY WITH SUBACROMIAL DECOMPRESSION, PARTIAL ACROMIOPLASTY WITH CORACOAROMIAL LABRUM DEBRIDEMENT RELEASE DISTAL CLAVICULECTOMY,  ROTATOR CUFF REPAIR AND EXTENSIVE DEBRIDEMENT;  Surgeon: Ninetta Lights, MD;  Location: [place];  Service: Orthopedics;  Laterality: Right;  . TENDON REPAIR  June 06, 2011   right elbow, Dr. Percell Miller    There were no vitals filed for this visit.      Subjective Assessment - 08/05/16 0930    Subjective feels like he is getting better - dx with bronchitis last week.  shoulder is feeling alright today.   Patient is accompained by: Family member   Pertinent History 2 previous RTC repairs   Limitations Lifting;House hold activities   Patient Stated Goals return to work and full recovery   Currently in Pain? Yes   Pain Score 3    Pain Location Shoulder   Pain Orientation Left   Pain Descriptors / Indicators  Aching;Nagging   Pain Type Surgical pain   Pain Onset More than a month ago   Pain Frequency Intermittent   Aggravating Factors  rolling onto L side   Pain Relieving Factors stabilization                         [facility] Adult PT Treatment/Exercise - 08/05/16 0932      Shoulder Exercises: Supine   Flexion Left;10 reps;AROM   ABduction Left;15 reps;AROM     Shoulder Exercises: Seated   Row 10 reps;Theraband   Theraband  Level (Shoulder Row) Level 3 (Green)   Row Limitations 10 sec hold   Flexion Limitations 10 x 5 sec hold at 60 degrees   ABduction Limitations attempted eccentric holds but increased pain after 2 reps so stopped     Shoulder Exercises: Prone   Retraction Left;10 reps   Retraction Limitations 5 sec hold   Extension 10 reps;Left;Limitations   Extension Limitations 5 sec hold     Shoulder Exercises: Standing   Other Standing Exercises wall ladder flexion and scaption x 10 LUE     Shoulder Exercises: Pulleys   Flexion 3 minutes   ABduction 3 minutes   ABduction Limitations scaption     Vasopneumatic   Number Minutes Vasopneumatic  15 minutes   Vasopnuematic Location  Shoulder   Vasopneumatic Pressure Low   Vasopneumatic Temperature  max cold     Manual Therapy   Joint Mobilization gentle grade 2-3 A/P and inf mobs L shoulder   Passive ROM L shoulder flexion, abdct and ir/er to tolerance - per protocol able to progress as tolerated                  PT Short Term Goals - 07/31/16 0945      PT SHORT TERM GOAL #1   Title L shoulder PROM Flexion to 120, ER to 60 at 90 ABD, and IR to 45 at 90 ABD by 07/25/16   Time 4   Period Weeks   Status On-going  8.9.17:  L shoulder PROM: IR 58 dg (at 90 dg abd), ER 60 dg (at 90 dg abd), 82 ABD, 104 dg flexion            PT Long Term Goals - 07/01/16 ID:[id number]      PT LONG TERM GOAL #1   Title Full PROM in order to progress into Phase II rehab (08/22/16)   Time 8   Period Weeks   Status On-going     PT LONG TERM GOAL #2   Title L shoulder AROM: flexion 120; abduction at least 90; and ir/er at least 60 without substitution for progression into phase II (08/22/16)   Time 8   Period Weeks   Status On-going     PT LONG TERM GOAL #3   Title n/a     PT LONG TERM GOAL #4   Title n/a               Plan - 08/05/16 1011    Clinical Impression Statement Pt continues to have pain with eccentric movements especially with  abduction.  Will continue to monitor and treat as able.  PROM progressing well.     Clinical Impairments Affecting Rehab Potential failed RTC repair x 2   PT Treatment/Interventions ADLs/Self Care Home Management;Cryotherapy;Electrical Stimulation;Moist Heat;Ultrasound;Patient/family education;Neuromuscular re-education;Therapeutic exercise;Therapeutic activities;Functional mobility training;Manual techniques;Dry needling;Taping;Passive range of motion;Vasopneumatic Device   PT Next Visit Plan follow protocol; slowly increasing  AROM to tolerance      Patient will benefit from skilled therapeutic intervention in order to improve the following deficits and impairments:  Postural dysfunction, Decreased strength, Impaired UE functional use, Pain, Decreased range of motion, Decreased scar mobility  Visit Diagnosis: Pain in left shoulder  Stiffness of left shoulder, not elsewhere classified  Left shoulder pain  Shoulder weakness  Shoulder stiffness, left     Problem List Patient Active Problem List   Diagnosis Date Noted  . Complete rotator cuff tear of left shoulder 07/05/2015  . Tennis elbow 05/30/2014  . Rotator cuff (capsule) sprain 03/10/2014  . Right shoulder pain 02/11/2014  . Memory difficulty 12/18/2012  . Gynecomastia, male 07/30/2012  . OSA (obstructive sleep apnea) 04/23/2011  . EXTERNAL HEMORRHOIDS 02/12/2011  . Prostate cancer ([place]) 11/12/2010  . PLANTAR FASCIITIS 04/09/2010  . Hypothyroidism 12/08/2009  . NEOPLASM OF UNCERTAIN BEHAVIOR OF SKIN 03/10/2009  . OSTEOARTHRITIS 02/14/2009  . HYPERLIPIDEMIA 02/08/2009  . HEARING DEFICIT 02/08/2009  . Essential hypertension 02/08/2009  . ALLERGIC RHINITIS 02/08/2009  . GERD 02/08/2009  . PROTEINURIA 02/08/2009  . DIVERTICULITIS, HX OF 02/08/2009       Laureen Abrahams, PT, DPT 08/05/16 11:47 AM    [facility] [address]  [place],  [place], [address] Phone: [phone]   Fax:  [phone]  Name: Zachary [name] MRN: [id number] Date of Birth: 1951/05/26

## 2016-08-08 ENCOUNTER — Ambulatory Visit: Payer: 59

## 2016-08-08 DIAGNOSIS — M25612 Stiffness of left shoulder, not elsewhere classified: Secondary | ICD-10-CM

## 2016-08-08 DIAGNOSIS — M25512 Pain in left shoulder: Secondary | ICD-10-CM | POA: Diagnosis not present

## 2016-08-08 DIAGNOSIS — R29898 Other symptoms and signs involving the musculoskeletal system: Secondary | ICD-10-CM | POA: Diagnosis not present

## 2016-08-08 NOTE — Therapy (Signed)
[facility] [address]  [place], [place], [address] Phone: [phone]   Fax:  [phone]  Physical Therapy Treatment  Patient Details  Name: Zachary [name] MRN: [id number] Date of Birth: 1951/10/17 Referring Provider: Dr. Corky Mull  Encounter Date: 08/08/2016      PT End of Session - 08/08/16 0939    Visit Number 12   Number of Visits 16   Date for PT Re-Evaluation 08/22/16   PT Start Time 0930   PT Stop Time 1030   PT Time Calculation (min) 60 min   Activity Tolerance Patient tolerated treatment well   Behavior During Therapy [facility] for tasks assessed/performed      Past Medical History:  Diagnosis Date  . Arthritis   . Articular cartilage disease    left shoulder  . Cancer ([place]) 2015   positive prostate cancer bx  . Diverticulitis   . GERD (gastroesophageal reflux disease)   . Hearing problem    hearing deficit  . Hemorrhoids   . Hyperlipidemia   . Hypertension   . Hypothyroidism   . Insomnia   . Sleep apnea    uses a cpap  . Wears glasses   . Wears hearing aid    both ears    Past Surgical History:  Procedure Laterality Date  . BACK SURGERY  1978   herniated disksurgery  . CARDIAC CATHETERIZATION  08/01/2010  . COLONOSCOPY    . EXAM UNDER ANESTHESIA WITH MANIPULATION OF SHOULDER Right 09/15/2014   Procedure: RIGHT SHOULDER MANIPULATION UNDER ANESTHESIA;  Surgeon: Ninetta Lights, MD;  Location: [facility];  Service: Orthopedics;  Laterality: Right;  . HEMORRHOID SURGERY  08/2006  . SHOULDER ARTHROSCOPY WITH DISTAL CLAVICLE RESECTION Left 07/07/2015   Procedure: SHOULDER ARTHROSCOPY WITH DISTAL CLAVICLE RESECTION;  Surgeon: Kathryne Hitch, MD;  Location: [place];  Service: Orthopedics;  Laterality: Left;  . SHOULDER ARTHROSCOPY WITH ROTATOR CUFF REPAIR Left 12/07/2015   Procedure: LEFT SHOULDER ARTHROSCOPY DEBRIDEMENT, WITH ROTATOR CUFF REPAIR;  Surgeon:  Ninetta Lights, MD;  Location: [place];  Service: Orthopedics;  Laterality: Left;  . SHOULDER ARTHROSCOPY WITH ROTATOR CUFF REPAIR AND SUBACROMIAL DECOMPRESSION Left 07/07/2015   Procedure: LEFT SHOULDER SCOPE DEBRIDEMENT, SUBACROMIAL DECOMPRESSION, DISTAL CLAVICULECTOMY, ROTATOR CUFF REPAIR  ;  Surgeon: Kathryne Hitch, MD;  Location: [place];  Service: Orthopedics;  Laterality: Left;  ANESTHESIA: GENERAL, PRE/POST OP SCALENE  . SHOULDER ARTHROSCOPY WITH SUBACROMIAL DECOMPRESSION, ROTATOR CUFF REPAIR AND BICEP TENDON REPAIR Right 03/10/2014   Procedure: RIGHT SHOULDER ARTHROSCOPY WITH SUBACROMIAL DECOMPRESSION, PARTIAL ACROMIOPLASTY WITH CORACOAROMIAL LABRUM DEBRIDEMENT RELEASE DISTAL CLAVICULECTOMY,  ROTATOR CUFF REPAIR AND EXTENSIVE DEBRIDEMENT;  Surgeon: Ninetta Lights, MD;  Location: [place];  Service: Orthopedics;  Laterality: Right;  . TENDON REPAIR  June 06, 2011   right elbow, Dr. Percell Miller    There were no vitals filed for this visit.      Subjective Assessment - 08/08/16 1614    Subjective Pt. reports his L shoulder initial pain is a 2/10 dull ache today.     Patient Stated Goals return to work and full recovery   Currently in Pain? Yes   Pain Score 2    Pain Location Shoulder   Pain Orientation Left   Pain Descriptors / Indicators Aching;Nagging   Pain Type Surgical pain   Pain Onset More than a month ago   Pain Frequency Intermittent   Multiple Pain  Sites No      Today's treatment:  Manual:  L shoulder PROM all directions L shoulder posterior, cuadal mobs   Therex: Prone lying L shoulder retraction x 15 reps  Prone lying L shoulder retraction with (~40 dg abduction) x 15 reps Prone lying L shoulder extension x 15 reps  Supine L shoulder flexion x 10 reps  Supine L shoulder abduction x 10 reps L shoulder flexion AAROM on peg board x 10 reps; 7/10 pain with this, however quickly resolved Standing scapular retraction  with green TB x 15 reps  Pulleys L shoulder flexion x 2 min  [place] L shoulder abduction x 2 min  Supine L shoulder IR/ER isometrics (2 finger resistance) 10 sec x 10 reps each side    Vasoneumatic Device to L shoulder: Seated L shoulder abd ~ 30 dg, low compression, coldest temp., 15 min         PT Short Term Goals - 07/31/16 0945      PT SHORT TERM GOAL #1   Title L shoulder PROM Flexion to 120, ER to 60 at 90 ABD, and IR to 45 at 90 ABD by 07/25/16   Time 4   Period Weeks   Status On-going  8.9.17:  L shoulder PROM: IR 58 dg (at 90 dg abd), ER 60 dg (at 90 dg abd), 82 ABD, 104 dg flexion            PT Long Term Goals - 07/01/16 ID:[id number]      PT LONG TERM GOAL #1   Title Full PROM in order to progress into Phase II rehab (08/22/16)   Time 8   Period Weeks   Status On-going     PT LONG TERM GOAL #2   Title L shoulder AROM: flexion 120; abduction at least 90; and ir/er at least 60 without substitution for progression into phase II (08/22/16)   Time 8   Period Weeks   Status On-going     PT LONG TERM GOAL #3   Title n/a     PT LONG TERM GOAL #4   Title n/a               Plan - 08/08/16 1017    Clinical Impression Statement Pt. reports his L shoulder initial pain is a 2/10 dull ache today.  Pt. tolerated today's treatment focused on L shoulder PROM and AAROM well; pt. pain increased to 8/10 with eccentric shoulder flexion in supine which quickly resolved.  Pt. L shoulder ROM continues to improve however therex (per protocol) still conservative and L shoulder IR/ER strengthening still isometric at this time.  Pt. 2/10 pain at end of session today.     PT Treatment/Interventions ADLs/Self Care Home Management;Cryotherapy;Electrical Stimulation;Moist Heat;Ultrasound;Patient/family education;Neuromuscular re-education;Therapeutic exercise;Therapeutic activities;Functional mobility training;Manual techniques;Dry needling;Taping;Passive range of motion;Vasopneumatic Device    PT Next Visit Plan follow protocol; slowly increasing AROM to tolerance      Patient will benefit from skilled therapeutic intervention in order to improve the following deficits and impairments:  Postural dysfunction, Decreased strength, Impaired UE functional use, Pain, Decreased range of motion, Decreased scar mobility  Visit Diagnosis: Pain in left shoulder  Stiffness of left shoulder, not elsewhere classified     Problem List Patient Active Problem List   Diagnosis Date Noted  . Complete rotator cuff tear of left shoulder 07/05/2015  . Tennis elbow 05/30/2014  . Rotator cuff (capsule) sprain 03/10/2014  . Right shoulder pain 02/11/2014  . Memory difficulty 12/18/2012  .  Gynecomastia, male 07/30/2012  . OSA (obstructive sleep apnea) 04/23/2011  . EXTERNAL HEMORRHOIDS 02/12/2011  . Prostate cancer ([place]) 11/12/2010  . PLANTAR FASCIITIS 04/09/2010  . Hypothyroidism 12/08/2009  . NEOPLASM OF UNCERTAIN BEHAVIOR OF SKIN 03/10/2009  . OSTEOARTHRITIS 02/14/2009  . HYPERLIPIDEMIA 02/08/2009  . HEARING DEFICIT 02/08/2009  . Essential hypertension 02/08/2009  . ALLERGIC RHINITIS 02/08/2009  . GERD 02/08/2009  . PROTEINURIA 02/08/2009  . DIVERTICULITIS, HX OF 02/08/2009    Bess Harvest, PTA 08/08/2016, 6:17 PM  [facility] [address]  [place], [place], [address] Phone: [phone]   Fax:  [phone]  Name: Zachary [name] MRN: [id number] Date of Birth: Aug 09, 1951

## 2016-08-12 ENCOUNTER — Ambulatory Visit: Payer: 59 | Admitting: Physical Therapy

## 2016-08-12 DIAGNOSIS — M25612 Stiffness of left shoulder, not elsewhere classified: Secondary | ICD-10-CM

## 2016-08-12 DIAGNOSIS — M25512 Pain in left shoulder: Secondary | ICD-10-CM | POA: Diagnosis not present

## 2016-08-12 DIAGNOSIS — R29898 Other symptoms and signs involving the musculoskeletal system: Secondary | ICD-10-CM | POA: Diagnosis not present

## 2016-08-12 NOTE — Therapy (Signed)
[facility] [address]  [place], [place], [address] Phone: [phone]   Fax:  [phone]  Physical Therapy Treatment  Patient Details  Name: Zachary [name] MRN: [id number] Date of Birth: [birth year] Referring Provider: Dr. Corky Mull  Encounter Date: 08/12/2016      PT End of Session - 08/12/16 1004    Visit Number 13   Number of Visits 16   Date for PT Re-Evaluation 08/22/16   PT Start Time 0927   PT Stop Time 1020   PT Time Calculation (min) 53 min   Activity Tolerance Patient tolerated treatment well   Behavior During Therapy [facility] for tasks assessed/performed      Past Medical History:  Diagnosis Date  . Arthritis   . Articular cartilage disease    left shoulder  . Cancer ([place]) 2015   positive prostate cancer bx  . Diverticulitis   . GERD (gastroesophageal reflux disease)   . Hearing problem    hearing deficit  . Hemorrhoids   . Hyperlipidemia   . Hypertension   . Hypothyroidism   . Insomnia   . Sleep apnea    uses a cpap  . Wears glasses   . Wears hearing aid    both ears    Past Surgical History:  Procedure Laterality Date  . BACK SURGERY  1978   herniated disksurgery  . CARDIAC CATHETERIZATION  08/01/2010  . COLONOSCOPY    . EXAM UNDER ANESTHESIA WITH MANIPULATION OF SHOULDER Right 09/15/2014   Procedure: RIGHT SHOULDER MANIPULATION UNDER ANESTHESIA;  Surgeon: Ninetta Lights, MD;  Location: [place];  Service: Orthopedics;  Laterality: Right;  . HEMORRHOID SURGERY  08/2006  . SHOULDER ARTHROSCOPY WITH DISTAL CLAVICLE RESECTION Left 07/07/2015   Procedure: SHOULDER ARTHROSCOPY WITH DISTAL CLAVICLE RESECTION;  Surgeon: Kathryne Hitch, MD;  Location: [place];  Service: Orthopedics;  Laterality: Left;  . SHOULDER ARTHROSCOPY WITH ROTATOR CUFF REPAIR Left 12/07/2015   Procedure: LEFT SHOULDER ARTHROSCOPY DEBRIDEMENT, WITH ROTATOR CUFF REPAIR;  Surgeon:  Ninetta Lights, MD;  Location: [place];  Service: Orthopedics;  Laterality: Left;  . SHOULDER ARTHROSCOPY WITH ROTATOR CUFF REPAIR AND SUBACROMIAL DECOMPRESSION Left 07/07/2015   Procedure: LEFT SHOULDER SCOPE DEBRIDEMENT, SUBACROMIAL DECOMPRESSION, DISTAL CLAVICULECTOMY, ROTATOR CUFF REPAIR  ;  Surgeon: Kathryne Hitch, MD;  Location: [place];  Service: Orthopedics;  Laterality: Left;  ANESTHESIA: GENERAL, PRE/POST OP SCALENE  . SHOULDER ARTHROSCOPY WITH SUBACROMIAL DECOMPRESSION, ROTATOR CUFF REPAIR AND BICEP TENDON REPAIR Right 03/10/2014   Procedure: RIGHT SHOULDER ARTHROSCOPY WITH SUBACROMIAL DECOMPRESSION, PARTIAL ACROMIOPLASTY WITH CORACOAROMIAL LABRUM DEBRIDEMENT RELEASE DISTAL CLAVICULECTOMY,  ROTATOR CUFF REPAIR AND EXTENSIVE DEBRIDEMENT;  Surgeon: Ninetta Lights, MD;  Location: [place];  Service: Orthopedics;  Laterality: Right;  . TENDON REPAIR  June 06, 2011   right elbow, Dr. Percell Miller    There were no vitals filed for this visit.      Subjective Assessment - 08/12/16 0929    Subjective having some pain in the morning "I think it's because of how I'm sleeping now" then reports it starts to resolve   Patient is accompained by: Family member   Pertinent History 2 previous RTC repairs   Limitations Lifting;House hold activities   Patient Stated Goals return to work and full recovery   Currently in Pain? Yes   Pain Score 2    Pain Location Shoulder   Pain Orientation Left   Pain  Descriptors / Indicators Aching;Nagging   Pain Type Surgical pain   Pain Onset More than a month ago   Pain Frequency Intermittent   Aggravating Factors  sleeping on L side   Pain Relieving Factors stabilization            [facility] PT Assessment - 08/12/16 0939      AROM   Left Shoulder External Rotation 62 Degrees     PROM   Left Shoulder Flexion 153 Degrees   Left Shoulder ABduction 145 Degrees   Left Shoulder Internal Rotation 90 Degrees    Left Shoulder External Rotation 56 Degrees  in scapular plane                     [facility] Adult PT Treatment/Exercise - 08/12/16 0930      Shoulder Exercises: Supine   Protraction Left;15 reps;Weights   Protraction Weight (lbs) 2   Internal Rotation Left;15 reps;Theraband   Theraband Level (Shoulder Internal Rotation) Level 2 (Red)   Flexion Left;15 reps;Weights   Shoulder Flexion Weight (lbs) 2     Shoulder Exercises: Sidelying   External Rotation Left;15 reps;Weights   External Rotation Weight (lbs) 2   External Rotation Limitations mild increase in pain last 3-5 reps with quick resolve of pain   ABduction Left;15 reps;Weights   ABduction Weight (lbs) 2   ABduction Limitations to 90 degrees   Other Sidelying Exercises scap retraction L 10x10 sec hold     Shoulder Exercises: Pulleys   Flexion 3 minutes   ABduction 3 minutes   ABduction Limitations scaption     Shoulder Exercises: ROM/Strengthening   Rhythmic Stabilization, Supine CW/CCW x 15 with 2#     Vasopneumatic   Number Minutes Vasopneumatic  15 minutes   Vasopnuematic Location  Shoulder   Vasopneumatic Pressure Low   Vasopneumatic Temperature  max cold     Manual Therapy   Passive ROM L shoulder flexion, abdct and ir/er to tolerance - per protocol able to progress as tolerated                  PT Short Term Goals - 08/12/16 1004      PT SHORT TERM GOAL #1   Title L shoulder PROM Flexion to 120, ER to 60 at 90 ABD, and IR to 45 at 90 ABD by 07/25/16   Status Achieved           PT Long Term Goals - 07/01/16 0858      PT LONG TERM GOAL #1   Title Full PROM in order to progress into Phase II rehab (08/22/16)   Time 8   Period Weeks   Status On-going     PT LONG TERM GOAL #2   Title L shoulder AROM: flexion 120; abduction at least 90; and ir/er at least 60 without substitution for progression into phase II (08/22/16)   Time 8   Period Weeks   Status On-going     PT LONG TERM GOAL  #3   Title n/a     PT LONG TERM GOAL #4   Title n/a               Plan - 08/12/16 1005    Clinical Impression Statement Pt with improvements in AROM and tolerated increased strengthening exercises today with mild increase in pain with sidelying ER but quickly resolved.  Progressing well towards LTGs.   Clinical Impairments Affecting Rehab Potential failed RTC repair x 2   PT  Treatment/Interventions ADLs/Self Care Home Management;Cryotherapy;Electrical Stimulation;Moist Heat;Ultrasound;Patient/family education;Neuromuscular re-education;Therapeutic exercise;Therapeutic activities;Functional mobility training;Manual techniques;Dry needling;Taping;Passive range of motion;Vasopneumatic Device   PT Next Visit Plan follow protocol; slowly increasing AROM to tolerance; able to incorporate strengthening as tolerated   Consulted and Agree with Plan of Care Patient      Patient will benefit from skilled therapeutic intervention in order to improve the following deficits and impairments:  Postural dysfunction, Decreased strength, Impaired UE functional use, Pain, Decreased range of motion, Decreased scar mobility  Visit Diagnosis: Pain in left shoulder  Stiffness of left shoulder, not elsewhere classified     Problem List Patient Active Problem List   Diagnosis Date Noted  . Complete rotator cuff tear of left shoulder 07/05/2015  . Tennis elbow 05/30/2014  . Rotator cuff (capsule) sprain 03/10/2014  . Right shoulder pain 02/11/2014  . Memory difficulty 12/18/2012  . Gynecomastia, male 07/30/2012  . OSA (obstructive sleep apnea) 04/23/2011  . EXTERNAL HEMORRHOIDS 02/12/2011  . Prostate cancer ([place]) 11/12/2010  . PLANTAR FASCIITIS 04/09/2010  . Hypothyroidism 12/08/2009  . NEOPLASM OF UNCERTAIN BEHAVIOR OF SKIN 03/10/2009  . OSTEOARTHRITIS 02/14/2009  . HYPERLIPIDEMIA 02/08/2009  . HEARING DEFICIT 02/08/2009  . Essential hypertension 02/08/2009  . ALLERGIC RHINITIS 02/08/2009   . GERD 02/08/2009  . PROTEINURIA 02/08/2009  . DIVERTICULITIS, HX OF 02/08/2009       Laureen Abrahams, PT, DPT 08/12/16 12:46 PM    [facility] [address]  [place], [place], [address] Phone: [phone]   Fax:  [phone]  Name: LOTTIE LINGREN MRN: [id number] Date of Birth: 1951/02/04

## 2016-08-15 ENCOUNTER — Encounter: Payer: Self-pay | Admitting: Family Medicine

## 2016-08-15 ENCOUNTER — Ambulatory Visit: Payer: 59

## 2016-08-15 ENCOUNTER — Ambulatory Visit (INDEPENDENT_AMBULATORY_CARE_PROVIDER_SITE_OTHER): Payer: 59 | Admitting: Family Medicine

## 2016-08-15 VITALS — BP 104/50 | HR 77 | Temp 98.1°F | Ht 68.0 in | Wt 207.0 lb

## 2016-08-15 DIAGNOSIS — E784 Other hyperlipidemia: Secondary | ICD-10-CM

## 2016-08-15 DIAGNOSIS — R05 Cough: Secondary | ICD-10-CM

## 2016-08-15 DIAGNOSIS — R059 Cough, unspecified: Secondary | ICD-10-CM

## 2016-08-15 DIAGNOSIS — I10 Essential (primary) hypertension: Secondary | ICD-10-CM

## 2016-08-15 DIAGNOSIS — E039 Hypothyroidism, unspecified: Secondary | ICD-10-CM

## 2016-08-15 DIAGNOSIS — R0982 Postnasal drip: Secondary | ICD-10-CM | POA: Diagnosis not present

## 2016-08-15 DIAGNOSIS — Z114 Encounter for screening for human immunodeficiency virus [HIV]: Secondary | ICD-10-CM

## 2016-08-15 DIAGNOSIS — C61 Malignant neoplasm of prostate: Secondary | ICD-10-CM | POA: Diagnosis not present

## 2016-08-15 DIAGNOSIS — Z131 Encounter for screening for diabetes mellitus: Secondary | ICD-10-CM

## 2016-08-15 DIAGNOSIS — E785 Hyperlipidemia, unspecified: Secondary | ICD-10-CM

## 2016-08-15 DIAGNOSIS — R29898 Other symptoms and signs involving the musculoskeletal system: Secondary | ICD-10-CM | POA: Diagnosis not present

## 2016-08-15 DIAGNOSIS — M25612 Stiffness of left shoulder, not elsewhere classified: Secondary | ICD-10-CM | POA: Diagnosis not present

## 2016-08-15 DIAGNOSIS — M25512 Pain in left shoulder: Secondary | ICD-10-CM | POA: Diagnosis not present

## 2016-08-15 HISTORY — DX: Hyperlipidemia, unspecified: E78.5

## 2016-08-15 MED ORDER — FLUTICASONE PROPIONATE 50 MCG/ACT NA SUSP: 2.0000 | Freq: Every day | NASAL | 6 refills | Status: DC

## 2016-08-15 MED FILL — FLUTICASONE PROP 50 MCG SPR: 50 | 30 days supply | Qty: 16 | Fill #0

## 2016-08-15 NOTE — Progress Notes (Signed)
Chief Complaint  Patient presents with  . Establish Care    cough x 4 weeks-(dry) at night,medication review and  refill.       New Patient Visit SUBJECTIVE: HPI: Zachary [name] is an 65 y.o.male who is being seen for establishing care.  The patient was previously seen at [facility]. His former PCP moved away.  Health maintenance history: PSA: Has hx of prostate bx  Colonoscopy: 2007? Routine blood work: 07/2015 HIV/Hep C screening: Hep C screening done and negative; HIV has not been done Tetanus: 12/2012 Shingles shot: 07/2015  Concerns: Dry cough at night that has been going on for 4 weeks since he had bronchitis. He was placed on Azithromycin, inhaler and an antihistamine nasal spray earlier in the month that did not help. He does not have a hx of COPD or asthma and has never had PFTs. He is on lisinopril. He feels like he is having drainage down the back of his throat. It only happens at night. He is not having any other URI symptoms, chest pain, or SOB.  Allergies  Allergen Reactions  . Oxycodone Itching  . Simvastatin     REACTION: muscle pain    Past Medical History:  Diagnosis Date  . Arthritis   . Articular cartilage disease    left shoulder  . Cancer ([place]) 2015   positive prostate cancer bx  . Diverticulitis   . Dyslipidemia (high LDL; low HDL) 08/15/2016  . GERD (gastroesophageal reflux disease)   . Hearing problem    hearing deficit  . Hemorrhoids   . Hyperlipidemia   . Hypertension   . Hypothyroidism   . Insomnia   . Sleep apnea    uses a cpap  . Wears glasses   . Wears hearing aid    both ears   Past Surgical History:  Procedure Laterality Date  . BACK SURGERY  1978   herniated disksurgery  . CARDIAC CATHETERIZATION  08/01/2010  . COLONOSCOPY    . EXAM UNDER ANESTHESIA WITH MANIPULATION OF SHOULDER Right 09/15/2014   Procedure: RIGHT SHOULDER MANIPULATION UNDER ANESTHESIA;  Surgeon: Ninetta Lights, MD;  Location: [place];  Service: Orthopedics;  Laterality: Right;  . HEMORRHOID SURGERY  08/2006  . SHOULDER ARTHROSCOPY WITH DISTAL CLAVICLE RESECTION Left 07/07/2015   Procedure: SHOULDER ARTHROSCOPY WITH DISTAL CLAVICLE RESECTION;  Surgeon: Kathryne Hitch, MD;  Location: [place];  Service: Orthopedics;  Laterality: Left;  . SHOULDER ARTHROSCOPY WITH ROTATOR CUFF REPAIR Left 12/07/2015   Procedure: LEFT SHOULDER ARTHROSCOPY DEBRIDEMENT, WITH ROTATOR CUFF REPAIR;  Surgeon: Ninetta Lights, MD;  Location: [place];  Service: Orthopedics;  Laterality: Left;  . SHOULDER ARTHROSCOPY WITH ROTATOR CUFF REPAIR AND SUBACROMIAL DECOMPRESSION Left 07/07/2015   Procedure: LEFT SHOULDER SCOPE DEBRIDEMENT, SUBACROMIAL DECOMPRESSION, DISTAL CLAVICULECTOMY, ROTATOR CUFF REPAIR  ;  Surgeon: Kathryne Hitch, MD;  Location: [place];  Service: Orthopedics;  Laterality: Left;  ANESTHESIA: GENERAL, PRE/POST OP SCALENE  . SHOULDER ARTHROSCOPY WITH SUBACROMIAL DECOMPRESSION, ROTATOR CUFF REPAIR AND BICEP TENDON REPAIR Right 03/10/2014   Procedure: RIGHT SHOULDER ARTHROSCOPY WITH SUBACROMIAL DECOMPRESSION, PARTIAL ACROMIOPLASTY WITH CORACOAROMIAL LABRUM DEBRIDEMENT RELEASE DISTAL CLAVICULECTOMY,  ROTATOR CUFF REPAIR AND EXTENSIVE DEBRIDEMENT;  Surgeon: Ninetta Lights, MD;  Location: [place];  Service: Orthopedics;  Laterality: Right;  . TENDON REPAIR  June 06, 2011   right elbow, Dr. Percell Miller   Social History   Social History  . Marital status: Married  Spouse name: Neoma Laming  . Number of children: 2  . Years of education: N/A   Occupational History  . [employer]   Social History Main Topics  . Smoking status: Never Smoker  . Smokeless tobacco: Never Used  . Alcohol use Yes     Comment: occasional  . Drug use: No   Social History Narrative   Previously Chartered loss adjuster [employer]    Family History  Problem Relation Age of Onset  . Heart disease Father   . Breast cancer Mother   . Cancer Brother   . Alcohol abuse Other   . Arthritis Other   . Cancer Other     Breast, Prostate  . Coronary artery disease Other   . Irritable bowel syndrome Other   . Cystic fibrosis Other      Current Outpatient Prescriptions:  .  acetaminophen (TYLENOL) 500 MG tablet, Take 500 mg by mouth every 6 (six) hours as needed., Disp: , Rfl:  .  aspirin EC 81 MG EC tablet, Take 81 mg by mouth daily.  , Disp: , Rfl:  .  levothyroxine (SYNTHROID, LEVOTHROID) 100 MCG tablet, TAKE 1 TABLET (100 MCG TOTAL) BY MOUTH DAILY., Disp: 90 tablet, Rfl: 1 .  lisinopril (PRINIVIL,ZESTRIL) 5 MG tablet, Take 1 tablet (5 mg total) by mouth daily., Disp: 90 tablet, Rfl: 1 .  loratadine (CLARITIN) 10 MG tablet, Take 10 mg by mouth daily.  , Disp: , Rfl:  .  naproxen sodium (ANAPROX) 220 MG tablet, Take 220 mg by mouth 2 (two) times daily with a meal. Reported on 06/27/2016, Disp: , Rfl:  .  omeprazole (PRILOSEC) 40 MG capsule, Take 1 capsule (40 mg total) by mouth daily. NEED FOLLOW UP APPOINTMENT FOR MORE REFILLS, Disp: 30 capsule, Rfl: 0 .  pravastatin (PRAVACHOL) 40 MG tablet, Take 0.5 tablets (20 mg total) by mouth daily. NEED FOLLOW UP VISIT IN September., Disp: 45 tablet, Rfl: 2 .  fluticasone (FLONASE) 50 MCG/ACT nasal spray, Place 2 sprays into both nostrils daily., Disp: 16 g, Rfl: 6   ROS Consitutional: Denies fevers  Cardiovascular: Denies chest pain or pressure, palpitations  Respiratory: Denies dyspnea, +cough   OBJECTIVE: BP (!) 104/50 (BP Location: Right Arm, Patient Position: Sitting, Cuff Size: Large)   Pulse 77   Temp 98.1 F (36.7 C) (Oral)   Ht 5\' 8"  (1.727 m)   Wt 207 lb (93.9 kg)   SpO2 97%   BMI 31.47 kg/m   Constitutional: -  VS reviewed -  Well developed, well nourished, appears stated age -  No apparent distress  Psychiatric: -  Oriented to person, place, and time -   Memory intact -  Affect and mood normal -  Fluent conversation, good eye contact -  Judgment and insight age appropriate  Eye: -  Conjunctivae clear, no discharge -  Pupils symmetric, round, reactive to light  ENMT: -  Ears are patent b/l without erythema or discharge. TM's are shiny and clear b/l without evidence of effusion or infection. -  Oral mucosa without lesions, tongue and uvula midline    Tonsils not enlarged, no erythema, no exudate, trachea midline    Pharynx moist, no lesions, no erythema  Neck: -  No gross swelling, no palpable masses -  Thyroid midline, not enlarged, mobile, no palpable masses  Cardiovascular: -  RRR, no murmurs -  No LE edema  Respiratory: -  Normal respiratory effort, no  accessory muscle use, no retraction -  Breath sounds equal, no wheezes, no ronchi, no crackles  Musculoskeletal: -  LUE in a sling  Skin: -  No significant lesion on inspection -  Warm and dry to palpation   ASSESSMENT/PLAN: Cough  PND (post-nasal drip) - Plan: fluticasone (FLONASE) 50 MCG/ACT nasal spray  Hypothyroidism, unspecified hypothyroidism type - Plan: TSH  Prostate cancer ([place]) - Plan: PSA  Essential hypertension - Plan: COMPLETE METABOLIC PANEL WITH GFR, Microalbumin / creatinine urine ratio  Dyslipidemia (high LDL; low HDL) - Plan: Lipid panel  Screening for diabetes mellitus (DM) - Plan: Hemoglobin A1c  Encounter for screening for HIV - Plan: HIV antibody  Orders as above. Fasting labs for next appt. Will trial steroid nasal spray and antihistamine to treat for post nasal drip cause of cough. Also explained to patient it could be a post infectious cough which could last a few more weeks. Lower on the differential include COPD/asthma, ACEi induced cough, laryngeal spasm/vocal cord polyp, GERD. Will obtain PFTs at next visit and discuss PFT's if still having cough. Will also consider changing ACEi to ARB. Patient should return in 3-4 weeks to recheck cough. The  patient voiced understanding and agreement to the plan.   Crosby Oyster Barronett

## 2016-08-15 NOTE — Therapy (Signed)
[facility] [address]  [place], [place], [address] Phone: [phone]   Fax:  [phone]  Physical Therapy Treatment  Patient Details  Name: Zachary [name] MRN: [id number] Date of Birth: 02/27/1951 Referring Provider: Dr. Corky Mull  Encounter Date: 08/15/2016      PT End of Session - 08/15/16 1218    Visit Number 14   Number of Visits 16   Date for PT Re-Evaluation 08/22/16   PT Start Time 0932   PT Stop Time 1015   PT Time Calculation (min) 43 min   Activity Tolerance Patient tolerated treatment well   Behavior During Therapy [facility] for tasks assessed/performed      Past Medical History:  Diagnosis Date  . Arthritis   . Articular cartilage disease    left shoulder  . Cancer ([place]) 2015   positive prostate cancer bx  . Diverticulitis   . GERD (gastroesophageal reflux disease)   . Hearing problem    hearing deficit  . Hemorrhoids   . Hyperlipidemia   . Hypertension   . Hypothyroidism   . Insomnia   . Sleep apnea    uses a cpap  . Wears glasses   . Wears hearing aid    both ears    Past Surgical History:  Procedure Laterality Date  . BACK SURGERY  1978   herniated disksurgery  . CARDIAC CATHETERIZATION  08/01/2010  . COLONOSCOPY    . EXAM UNDER ANESTHESIA WITH MANIPULATION OF SHOULDER Right 09/15/2014   Procedure: RIGHT SHOULDER MANIPULATION UNDER ANESTHESIA;  Surgeon: Ninetta Lights, MD;  Location: [place];  Service: Orthopedics;  Laterality: Right;  . HEMORRHOID SURGERY  08/2006  . SHOULDER ARTHROSCOPY WITH DISTAL CLAVICLE RESECTION Left 07/07/2015   Procedure: SHOULDER ARTHROSCOPY WITH DISTAL CLAVICLE RESECTION;  Surgeon: Kathryne Hitch, MD;  Location: [place];  Service: Orthopedics;  Laterality: Left;  . SHOULDER ARTHROSCOPY WITH ROTATOR CUFF REPAIR Left 12/07/2015   Procedure: LEFT SHOULDER ARTHROSCOPY DEBRIDEMENT, WITH ROTATOR CUFF REPAIR;  Surgeon:  Ninetta Lights, MD;  Location: [place];  Service: Orthopedics;  Laterality: Left;  . SHOULDER ARTHROSCOPY WITH ROTATOR CUFF REPAIR AND SUBACROMIAL DECOMPRESSION Left 07/07/2015   Procedure: LEFT SHOULDER SCOPE DEBRIDEMENT, SUBACROMIAL DECOMPRESSION, DISTAL CLAVICULECTOMY, ROTATOR CUFF REPAIR  ;  Surgeon: Kathryne Hitch, MD;  Location: [place];  Service: Orthopedics;  Laterality: Left;  ANESTHESIA: GENERAL, PRE/POST OP SCALENE  . SHOULDER ARTHROSCOPY WITH SUBACROMIAL DECOMPRESSION, ROTATOR CUFF REPAIR AND BICEP TENDON REPAIR Right 03/10/2014   Procedure: RIGHT SHOULDER ARTHROSCOPY WITH SUBACROMIAL DECOMPRESSION, PARTIAL ACROMIOPLASTY WITH CORACOAROMIAL LABRUM DEBRIDEMENT RELEASE DISTAL CLAVICULECTOMY,  ROTATOR CUFF REPAIR AND EXTENSIVE DEBRIDEMENT;  Surgeon: Ninetta Lights, MD;  Location: [place];  Service: Orthopedics;  Laterality: Right;  . TENDON REPAIR  June 06, 2011   right elbow, Dr. Percell Miller    There were no vitals filed for this visit.      Subjective Assessment - 08/15/16 0934    Subjective Pt. pain free with initially today however with increased pain in the mornings and states he is trying to lay on L side in bad.     Patient Stated Goals return to work and full recovery   Currently in Pain? No/denies   Pain Score 0-No pain   Multiple Pain Sites No        Today's treatment:  Manual:  L shoulder PROM all directions L shoulder posterior, cuadal mobs  Therex: Pulleys L shoulder flexion x 3 min  [place] L shoulder abduction x 3 min  Supine L shoulder circles CW, CCW with 2# x 15 reps  Supine L shoulder protraction with 2# x 20 reps; 4/10 pain following this R sidelying L ER with no weight x 10 reps  R sidelying L ER with 1# x 7 Supine L shoulder IR with red TB x 10 reps; 2/10 pain, self-resolved quickly  Prone lying L shoulder retraction (neutral) with 2# x 15 reps  Prone lying L shoulder retraction with (~40 dg  abduction) with 1# x 15 reps Prone lying L shoulder extension (no weight) 5" x 15 reps  Standing scapular retraction with green TB x 15 reps  Supine L shoulder IR/ER isometrics (2 finger resistance) 10 sec x 10 reps each side          PT Short Term Goals - 08/12/16 1004      PT SHORT TERM GOAL #1   Title L shoulder PROM Flexion to 120, ER to 60 at 90 ABD, and IR to 45 at 90 ABD by 07/25/16   Status Achieved           PT Long Term Goals - 07/01/16 0858      PT LONG TERM GOAL #1   Title Full PROM in order to progress into Phase II rehab (08/22/16)   Time 8   Period Weeks   Status On-going     PT LONG TERM GOAL #2   Title L shoulder AROM: flexion 120; abduction at least 90; and ir/er at least 60 without substitution for progression into phase II (08/22/16)   Time 8   Period Weeks   Status On-going     PT LONG TERM GOAL #3   Title n/a     PT LONG TERM GOAL #4   Title n/a               Plan - 08/15/16 1219    Clinical Impression Statement Pt. pain free with initially today however with increased pain in the mornings and states he is trying to lay on L side in bed.  Pt. tolerated all AAROM and AROM activities today well per protocol.  All AROM movements in gravity minimized positions today.  Pt. continues to perform well with gentle TB resisted L shoulder IR and scapular strengthening activity.  L shoulder pain at 4/10 at worst today with L shoulder ER which quickly self-resolved.  Pt. pain well controlled today thus no ice used.  Pt. progressing well at this point.  Pt. to return to therapy on 8/28 and with f/u with MD on 9/1.     PT Treatment/Interventions ADLs/Self Care Home Management;Cryotherapy;Electrical Stimulation;Moist Heat;Ultrasound;Patient/family education;Neuromuscular re-education;Therapeutic exercise;Therapeutic activities;Functional mobility training;Manual techniques;Dry needling;Taping;Passive range of motion;Vasopneumatic Device   PT Next Visit Plan  follow protocol; slowly increasing AROM to tolerance; able to incorporate strengthening as tolerated      Patient will benefit from skilled therapeutic intervention in order to improve the following deficits and impairments:  Postural dysfunction, Decreased strength, Impaired UE functional use, Pain, Decreased range of motion, Decreased scar mobility  Visit Diagnosis: Pain in left shoulder  Stiffness of left shoulder, not elsewhere classified     Problem List Patient Active Problem List   Diagnosis Date Noted  . Complete rotator cuff tear of left shoulder 07/05/2015  . Tennis elbow 05/30/2014  . Rotator cuff (capsule) sprain 03/10/2014  . Right shoulder pain 02/11/2014  . Memory difficulty 12/18/2012  .  Gynecomastia, male 07/30/2012  . OSA (obstructive sleep apnea) 04/23/2011  . EXTERNAL HEMORRHOIDS 02/12/2011  . Prostate cancer ([place]) 11/12/2010  . PLANTAR FASCIITIS 04/09/2010  . Hypothyroidism 12/08/2009  . NEOPLASM OF UNCERTAIN BEHAVIOR OF SKIN 03/10/2009  . OSTEOARTHRITIS 02/14/2009  . HYPERLIPIDEMIA 02/08/2009  . HEARING DEFICIT 02/08/2009  . Essential hypertension 02/08/2009  . ALLERGIC RHINITIS 02/08/2009  . GERD 02/08/2009  . PROTEINURIA 02/08/2009  . DIVERTICULITIS, HX OF 02/08/2009    Bess Harvest, PTA 08/15/2016, 12:21 PM  [facility] [address]  [place], [place], [address] Phone: [phone]   Fax:  [phone]  Name: Zachary [name] MRN: [id number] Date of Birth: 04-May-1951

## 2016-08-15 NOTE — Progress Notes (Signed)
Pre visit review using our clinic review tool, if applicable. No additional management support is needed unless otherwise documented below in the visit note. 

## 2016-08-15 NOTE — Patient Instructions (Addendum)
Claritin (loratadine), Allegra (fexofenadine), Zyrtec (certirizine) are daily anti-allergy medications.  Flonase (fluticasone nasal spray) is available OTC.  Honey is also effective at helping with chronic cough.   Stay away from the things that aggravating your cough.

## 2016-08-19 ENCOUNTER — Ambulatory Visit: Payer: 59

## 2016-08-19 DIAGNOSIS — R29898 Other symptoms and signs involving the musculoskeletal system: Secondary | ICD-10-CM | POA: Diagnosis not present

## 2016-08-19 DIAGNOSIS — M25612 Stiffness of left shoulder, not elsewhere classified: Secondary | ICD-10-CM

## 2016-08-19 DIAGNOSIS — M25512 Pain in left shoulder: Secondary | ICD-10-CM | POA: Diagnosis not present

## 2016-08-19 NOTE — Therapy (Signed)
[facility] [address]  [place], [place], [address] Phone: [phone]   Fax:  [phone]  Physical Therapy Treatment  Patient Details  Name: Zachary [name] MRN: [id number] Date of Birth: [birth year] Referring Provider: Dr. Corky Mull  Encounter Date: 08/19/2016      PT End of Session - 08/19/16 0940    Visit Number 15   Number of Visits 16   Date for PT Re-Evaluation 08/22/16   PT Start Time 0935   PT Stop Time 1015   PT Time Calculation (min) 40 min   Activity Tolerance Patient tolerated treatment well   Behavior During Therapy [facility] for tasks assessed/performed      Past Medical History:  Diagnosis Date  . Arthritis   . Articular cartilage disease    left shoulder  . Cancer ([place]) 2015   positive prostate cancer bx  . Diverticulitis   . Dyslipidemia (high LDL; low HDL) 08/15/2016  . GERD (gastroesophageal reflux disease)   . Hearing problem    hearing deficit  . Hemorrhoids   . Hyperlipidemia   . Hypertension   . Hypothyroidism   . Insomnia   . Sleep apnea    uses a cpap  . Wears glasses   . Wears hearing aid    both ears    Past Surgical History:  Procedure Laterality Date  . BACK SURGERY  1978   herniated disksurgery  . CARDIAC CATHETERIZATION  08/01/2010  . COLONOSCOPY    . EXAM UNDER ANESTHESIA WITH MANIPULATION OF SHOULDER Right 09/15/2014   Procedure: RIGHT SHOULDER MANIPULATION UNDER ANESTHESIA;  Surgeon: Ninetta Lights, MD;  Location: [place];  Service: Orthopedics;  Laterality: Right;  . HEMORRHOID SURGERY  08/2006  . SHOULDER ARTHROSCOPY WITH DISTAL CLAVICLE RESECTION Left 07/07/2015   Procedure: SHOULDER ARTHROSCOPY WITH DISTAL CLAVICLE RESECTION;  Surgeon: Kathryne Hitch, MD;  Location: [place];  Service: Orthopedics;  Laterality: Left;  . SHOULDER ARTHROSCOPY WITH ROTATOR CUFF REPAIR Left 12/07/2015   Procedure: LEFT SHOULDER ARTHROSCOPY  DEBRIDEMENT, WITH ROTATOR CUFF REPAIR;  Surgeon: Ninetta Lights, MD;  Location: [place];  Service: Orthopedics;  Laterality: Left;  . SHOULDER ARTHROSCOPY WITH ROTATOR CUFF REPAIR AND SUBACROMIAL DECOMPRESSION Left 07/07/2015   Procedure: LEFT SHOULDER SCOPE DEBRIDEMENT, SUBACROMIAL DECOMPRESSION, DISTAL CLAVICULECTOMY, ROTATOR CUFF REPAIR  ;  Surgeon: Kathryne Hitch, MD;  Location: [place];  Service: Orthopedics;  Laterality: Left;  ANESTHESIA: GENERAL, PRE/POST OP SCALENE  . SHOULDER ARTHROSCOPY WITH SUBACROMIAL DECOMPRESSION, ROTATOR CUFF REPAIR AND BICEP TENDON REPAIR Right 03/10/2014   Procedure: RIGHT SHOULDER ARTHROSCOPY WITH SUBACROMIAL DECOMPRESSION, PARTIAL ACROMIOPLASTY WITH CORACOAROMIAL LABRUM DEBRIDEMENT RELEASE DISTAL CLAVICULECTOMY,  ROTATOR CUFF REPAIR AND EXTENSIVE DEBRIDEMENT;  Surgeon: Ninetta Lights, MD;  Location: [place];  Service: Orthopedics;  Laterality: Right;  . TENDON REPAIR  June 06, 2011   right elbow, Dr. Percell Miller    There were no vitals filed for this visit.      Subjective Assessment - 08/19/16 0936    Subjective Pt. reports he has an initial pain of 1/10 today in the L shoulder and hasn't had any real problems with the shoulder over the weekend except trying to sleep on the L side.    Patient Stated Goals return to work and full recovery   Currently in Pain? Yes   Pain Score 1    Pain Location Shoulder   Pain Descriptors / Indicators Aching;Nagging  Pain Type Surgical pain   Pain Onset More than a month ago   Pain Frequency Intermittent   Aggravating Factors  sleeping on L side    Multiple Pain Sites No     Today's treatment:  Therex: Pulleys L shoulder flexion x 3 min  Pulley L shoulder abduction x 3 min   Manual:  L shoulder PROM all directions  Therex: Supine L shoulder circles CW, CCW with 2# x 15 reps (medium Pizzas today; larger diameter) Supine B shoulder protraction with 3# x 20  reps; R sidelying L ER with 1# x 12 Supine L shoulder IR with red TB x 15 reps; 2/10 pain, self-resolved quickly  Prone lying L shoulder retraction (neutral) with 2# x 15 reps  Prone lying L shoulder retraction (neutral) with 3# x 10 reps  Prone lying L shoulder retraction with (~40 dg abduction) with 2# x 15 reps Prone lying L shoulder extension 1# 3" x 15 reps  Standing scapular retraction with green TB x 15 reps  Standing scapular retraction/ext. With red TB x 10 reps BATCA low row with 15# x 15 reps        PT Short Term Goals - 08/12/16 1004      PT SHORT TERM GOAL #1   Title L shoulder PROM Flexion to 120, ER to 60 at 90 ABD, and IR to 45 at 90 ABD by 07/25/16   Status Achieved           PT Long Term Goals - 08/19/16 1012      PT LONG TERM GOAL #1   Title Full PROM in order to progress into Phase II rehab (08/22/16)   Time 8   Period Weeks   Status On-going     PT LONG TERM GOAL #2   Title L shoulder AROM: flexion 120; abduction at least 90; and ir/er at least 60 without substitution for progression into phase II (08/22/16)   Time 8   Period Weeks   Status On-going     PT LONG TERM GOAL #3   Title n/a     PT LONG TERM GOAL #4   Title n/a               Plan - 08/19/16 0941    Clinical Impression Statement Pt. reports he has an initial pain of 1/10 today in the L shoulder and hasn't had any real problems with the shoulder over the weekend except trying to sleep on the L side.  Pt. tolerated progression in resistance with scapular and shoulder strengthening activity in gravity minimized positions well.  Pain increase up to 3/10 with end range PROM flexion and abduction today.  Pt. still improving in L shoulder PROM today with: 158 dg flexion, 145 dg abd, 68 dg ER; 69 dg IR today somewhat decreased since last measurement.  Overall, pt. seems to be progressing well tolerating more advanced activities with less intense discomfort at L shoulder.  Pt. to see PT on  8/31 and f/u with MD on 9/1.    PT Treatment/Interventions ADLs/Self Care Home Management;Cryotherapy;Electrical Stimulation;Moist Heat;Ultrasound;Patient/family education;Neuromuscular re-education;Therapeutic exercise;Therapeutic activities;Functional mobility training;Manual techniques;Dry needling;Taping;Passive range of motion;Vasopneumatic Device   PT Next Visit Plan follow protocol; slowly increasing AROM to tolerance; able to incorporate strengthening as tolerated      Patient will benefit from skilled therapeutic intervention in order to improve the following deficits and impairments:  Postural dysfunction, Decreased strength, Impaired UE functional use, Pain, Decreased range of motion, Decreased scar mobility  Visit Diagnosis: Pain in left shoulder  Stiffness of left shoulder, not elsewhere classified     Problem List Patient Active Problem List   Diagnosis Date Noted  . Dyslipidemia (high LDL; low HDL) 08/15/2016  . Complete rotator cuff tear of left shoulder 07/05/2015  . Tennis elbow 05/30/2014  . Rotator cuff (capsule) sprain 03/10/2014  . Right shoulder pain 02/11/2014  . Memory difficulty 12/18/2012  . Gynecomastia, male 07/30/2012  . OSA (obstructive sleep apnea) 04/23/2011  . EXTERNAL HEMORRHOIDS 02/12/2011  . Prostate cancer ([place]) 11/12/2010  . PLANTAR FASCIITIS 04/09/2010  . Hypothyroidism 12/08/2009  . NEOPLASM OF UNCERTAIN BEHAVIOR OF SKIN 03/10/2009  . OSTEOARTHRITIS 02/14/2009  . HEARING DEFICIT 02/08/2009  . Essential hypertension 02/08/2009  . ALLERGIC RHINITIS 02/08/2009  . GERD 02/08/2009  . PROTEINURIA 02/08/2009  . DIVERTICULITIS, HX OF 02/08/2009    Zachary [name], PTA 08/19/2016, 11:45 AM  [facility] [address]  [place], [place], [address] Phone: [phone]   Fax:  [phone]  Name: Zachary [name] MRN: [id number] Date of Birth: 1951/08/11

## 2016-08-22 ENCOUNTER — Ambulatory Visit: Payer: 59 | Admitting: Physical Therapy

## 2016-08-22 DIAGNOSIS — M25512 Pain in left shoulder: Secondary | ICD-10-CM

## 2016-08-22 DIAGNOSIS — M25612 Stiffness of left shoulder, not elsewhere classified: Secondary | ICD-10-CM

## 2016-08-22 DIAGNOSIS — R29898 Other symptoms and signs involving the musculoskeletal system: Secondary | ICD-10-CM | POA: Diagnosis not present

## 2016-08-22 NOTE — Therapy (Signed)
[facility] [address]  [place], [place], [address] Phone: [phone]   Fax:  [phone]  Physical Therapy Treatment  Patient Details  Name: Zachary [name] MRN: [id number] Date of Birth: August 08, 1951 Referring Provider: Dr. Corky Mull  Encounter Date: 08/22/2016      PT End of Session - 08/22/16 1340    Visit Number 16   Number of Visits 32   Date for PT Re-Evaluation 10/17/16   PT Start Time 0930   PT Stop Time 1011   PT Time Calculation (min) 41 min   Activity Tolerance Patient tolerated treatment well   Behavior During Therapy [facility] for tasks assessed/performed      Past Medical History:  Diagnosis Date  . Arthritis   . Articular cartilage disease    left shoulder  . Cancer ([place]) 2015   positive prostate cancer bx  . Diverticulitis   . Dyslipidemia (high LDL; low HDL) 08/15/2016  . GERD (gastroesophageal reflux disease)   . Hearing problem    hearing deficit  . Hemorrhoids   . Hyperlipidemia   . Hypertension   . Hypothyroidism   . Insomnia   . Sleep apnea    uses a cpap  . Wears glasses   . Wears hearing aid    both ears    Past Surgical History:  Procedure Laterality Date  . BACK SURGERY  1978   herniated disksurgery  . CARDIAC CATHETERIZATION  08/01/2010  . COLONOSCOPY    . EXAM UNDER ANESTHESIA WITH MANIPULATION OF SHOULDER Right 09/15/2014   Procedure: RIGHT SHOULDER MANIPULATION UNDER ANESTHESIA;  Surgeon: Ninetta Lights, MD;  Location: [facility];  Service: Orthopedics;  Laterality: Right;  . HEMORRHOID SURGERY  08/2006  . SHOULDER ARTHROSCOPY WITH DISTAL CLAVICLE RESECTION Left 07/07/2015   Procedure: SHOULDER ARTHROSCOPY WITH DISTAL CLAVICLE RESECTION;  Surgeon: Kathryne Hitch, MD;  Location: [place] [place];  Service: Orthopedics;  Laterality: Left;  . SHOULDER ARTHROSCOPY WITH ROTATOR CUFF REPAIR Left 12/07/2015   Procedure: LEFT SHOULDER ARTHROSCOPY  DEBRIDEMENT, WITH ROTATOR CUFF REPAIR;  Surgeon: Ninetta Lights, MD;  Location: [facility];  Service: Orthopedics;  Laterality: Left;  . SHOULDER ARTHROSCOPY WITH ROTATOR CUFF REPAIR AND SUBACROMIAL DECOMPRESSION Left 07/07/2015   Procedure: LEFT SHOULDER SCOPE DEBRIDEMENT, SUBACROMIAL DECOMPRESSION, DISTAL CLAVICULECTOMY, ROTATOR CUFF REPAIR  ;  Surgeon: Kathryne Hitch, MD;  Location: [place] [facility];  Service: Orthopedics;  Laterality: Left;  ANESTHESIA: GENERAL, PRE/POST OP SCALENE  . SHOULDER ARTHROSCOPY WITH SUBACROMIAL DECOMPRESSION, ROTATOR CUFF REPAIR AND BICEP TENDON REPAIR Right 03/10/2014   Procedure: RIGHT SHOULDER ARTHROSCOPY WITH SUBACROMIAL DECOMPRESSION, PARTIAL ACROMIOPLASTY WITH CORACOAROMIAL LABRUM DEBRIDEMENT RELEASE DISTAL CLAVICULECTOMY,  ROTATOR CUFF REPAIR AND EXTENSIVE DEBRIDEMENT;  Surgeon: Ninetta Lights, MD;  Location: [facility];  Service: Orthopedics;  Laterality: Right;  . TENDON REPAIR  June 06, 2011   right elbow, Dr. Percell Miller    There were no vitals filed for this visit.      Subjective Assessment - 08/22/16 0933    Subjective having some tenderness on L shoulder; still can't sleep on it.   Pertinent History 2 previous RTC repairs   Limitations Lifting;House hold activities   Patient Stated Goals return to work and full recovery   Currently in Pain? Yes   Pain Score 2    Pain Location Shoulder   Pain Orientation Left   Pain Descriptors / Indicators Aching;Tender   Pain Type Surgical pain  Pain Onset More than a month ago   Pain Frequency Intermittent   Aggravating Factors  sleeping on L side   Pain Relieving Factors stabilization            [facility] PT Assessment - 08/22/16 0939      AROM   Left Shoulder Flexion 147 Degrees   Left Shoulder ABduction 94 Degrees   Left Shoulder Internal Rotation 77 Degrees   Left Shoulder External Rotation 56 Degrees     PROM   Left Shoulder Flexion 156 Degrees   Left  Shoulder ABduction 127 Degrees  156 supine   Left Shoulder Internal Rotation 80 Degrees   Left Shoulder External Rotation 60 Degrees                     [facility] Adult PT Treatment/Exercise - 08/22/16 0935      Shoulder Exercises: Supine   Protraction Left;20 reps   Protraction Weight (lbs) yellow med ball     Shoulder Exercises: Seated   Flexion Limitations 10 x 5 sec hold at 90 degrees with 1#   ABduction Weight (lbs) 10 x 5 sec hold     Shoulder Exercises: Prone   Retraction Left;20 reps;Weights   Retraction Weight (lbs) 3   Retraction Limitations 3 sec hold   Extension Left;20 reps;Weights   Extension Weight (lbs) 3   Extension Limitations 3 sec hold     Shoulder Exercises: Pulleys   Flexion 3 minutes   ABduction 3 minutes     Shoulder Exercises: ROM/Strengthening   Rhythmic Stabilization, Supine CW/CCW x 15, A-Z with yellow med ball                  PT Short Term Goals - 08/22/16 1348      PT SHORT TERM GOAL #2   Title improve L shoulder AROM external rotation to at least 70 degrees for improved function (09/19/16)   Time 4   Period Weeks   Status New           PT Long Term Goals - 08/22/16 1341      PT LONG TERM GOAL #1   Title Full PROM in order to progress into Phase II rehab (08/22/16)   Baseline external rotation not yet me   Status Partially Met     PT LONG TERM GOAL #2   Title L shoulder AROM: flexion 120; abduction at least 90; and ir/er at least 60 without substitution for progression into phase II (08/22/16)   Baseline all met except er only 56 degrees (4 degrees from goal)   Status Achieved     PT LONG TERM GOAL #3   Title demonstrate 4/5 strength in L shoulder for improved function (10/17/16)   Time 8   Period Weeks   Status New     PT LONG TERM GOAL #4   Title report ability to sleep on L side with pain < 3/10 for improved function (10/17/16)   Time 8   Period Weeks   Status New               Plan -  08/22/16 1342    Clinical Impression Statement Pt has met most ROM goals today with limitations mostly with external rotation.  Pt continues to have pain with eccentric lowering in flexion and abduction.  At this time recommending additional PT 2x/wk x 8 weeks to continue to address external rotation limitations as well as improve strength in L shoulder.  Rehab Potential Good   Clinical Impairments Affecting Rehab Potential failed RTC repair x 2   PT Frequency 2x / week   PT Duration 8 weeks   PT Treatment/Interventions ADLs/Self Care Home Management;Cryotherapy;Electrical Stimulation;Moist Heat;Ultrasound;Patient/family education;Neuromuscular re-education;Therapeutic exercise;Therapeutic activities;Functional mobility training;Manual techniques;Dry needling;Taping;Passive range of motion;Vasopneumatic Device   PT Next Visit Plan follow protocol; continue A/PROM and begin strengthening as tolerated and as MD allows   Consulted and Agree with Plan of Care Patient      Patient will benefit from skilled therapeutic intervention in order to improve the following deficits and impairments:  Postural dysfunction, Decreased strength, Impaired UE functional use, Pain, Decreased range of motion, Decreased scar mobility  Visit Diagnosis: Pain in left shoulder - Plan: PT plan of care cert/re-cert  Stiffness of left shoulder, not elsewhere classified - Plan: PT plan of care cert/re-cert     Problem List Patient Active Problem List   Diagnosis Date Noted  . Dyslipidemia (high LDL; low HDL) 08/15/2016  . Complete rotator cuff tear of left shoulder 07/05/2015  . Tennis elbow 05/30/2014  . Rotator cuff (capsule) sprain 03/10/2014  . Right shoulder pain 02/11/2014  . Memory difficulty 12/18/2012  . Gynecomastia, male 07/30/2012  . OSA (obstructive sleep apnea) 04/23/2011  . EXTERNAL HEMORRHOIDS 02/12/2011  . Prostate cancer ([place]) 11/12/2010  . PLANTAR FASCIITIS 04/09/2010  . Hypothyroidism  12/08/2009  . NEOPLASM OF UNCERTAIN BEHAVIOR OF SKIN 03/10/2009  . OSTEOARTHRITIS 02/14/2009  . HEARING DEFICIT 02/08/2009  . Essential hypertension 02/08/2009  . ALLERGIC RHINITIS 02/08/2009  . GERD 02/08/2009  . PROTEINURIA 02/08/2009  . DIVERTICULITIS, HX OF 02/08/2009       Laureen Abrahams, PT, DPT 08/22/16 1:50 PM    [facility] : [facility] [address]  [place], [place], [address] Phone: [phone]   Fax:  [phone]  Name: LUCAN RINER MRN: [id number] Date of Birth: 1951/03/16

## 2016-08-23 DIAGNOSIS — Z4889 Encounter for other specified surgical aftercare: Secondary | ICD-10-CM | POA: Diagnosis not present

## 2016-08-23 DIAGNOSIS — M75122 Complete rotator cuff tear or rupture of left shoulder, not specified as traumatic: Secondary | ICD-10-CM | POA: Diagnosis not present

## 2016-08-27 ENCOUNTER — Ambulatory Visit: Payer: 59 | Attending: Sports Medicine | Admitting: Physical Therapy

## 2016-08-27 DIAGNOSIS — M25512 Pain in left shoulder: Secondary | ICD-10-CM | POA: Insufficient documentation

## 2016-08-27 DIAGNOSIS — M25612 Stiffness of left shoulder, not elsewhere classified: Secondary | ICD-10-CM | POA: Diagnosis not present

## 2016-08-27 NOTE — Patient Instructions (Signed)
Strengthening: Resisted Internal Rotation   Hold tubing in left hand, elbow at side and forearm out. Rotate forearm in across body. Repeat __15__ times per set. Do _1___ sets per session. Do __2-3__ sessions per day.  http://orth.exer.us/830   Copyright  VHI. All rights reserved.  Strengthening: Resisted External Rotation   Hold tubing in left hand, elbow at side and forearm across body. Rotate forearm out. Repeat _15___ times per set. Do _1___ sets per session. Do __2-3__ sessions per day.  http://orth.exer.us/828   Copyright  VHI. All rights reserved.  Strengthening: Resisted Flexion   Hold tubing with left arm at side. Pull forward and up. Move shoulder through pain-free range of motion. Repeat _15___ times per set. Do __1__ sets per session. Do __2-3__ sessions per day.  http://orth.exer.us/824   Copyright  VHI. All rights reserved.  Strengthening: Resisted Extension   Hold tubing in left hand, arm forward. Pull arm back, elbow straight. Repeat __15__ times per set. Do __1__ sets per session. Do __2-3__ sessions per day.  http://orth.exer.us/832   Copyright  VHI. All rights reserved.    Strengthening: Resisted Abduction    Hold tubing with left arm across body. Pull up and away from side. Move through pain-free range of motion. Repeat __15__ times per set. Do __1__ sets per session. Do __2-3__ sessions per day.  http://orth.exer.us/826   Copyright  VHI. All rights reserved.

## 2016-08-27 NOTE — Therapy (Signed)
[facility] [address]  [place], [place], [address] Phone: [phone]   Fax:  [phone]  Physical Therapy Treatment  Patient Details  Name: Zachary [name] MRN: [id number] Date of Birth: 11/28/1951 Referring Provider: Dr. Corky Mull  Encounter Date: 08/27/2016      PT End of Session - 08/27/16 1013    Visit Number 17   Number of Visits 32   Date for PT Re-Evaluation 10/17/16   PT Start Time 0931   PT Stop Time 1012   PT Time Calculation (min) 41 min   Activity Tolerance Patient tolerated treatment well   Behavior During Therapy [facility] for tasks assessed/performed      Past Medical History:  Diagnosis Date  . Arthritis   . Articular cartilage disease    left shoulder  . Cancer ([place]) 2015   positive prostate cancer bx  . Diverticulitis   . Dyslipidemia (high LDL; low HDL) 08/15/2016  . GERD (gastroesophageal reflux disease)   . Hearing problem    hearing deficit  . Hemorrhoids   . Hyperlipidemia   . Hypertension   . Hypothyroidism   . Insomnia   . Sleep apnea    uses a cpap  . Wears glasses   . Wears hearing aid    both ears    Past Surgical History:  Procedure Laterality Date  . BACK SURGERY  1978   herniated disksurgery  . CARDIAC CATHETERIZATION  08/01/2010  . COLONOSCOPY    . EXAM UNDER ANESTHESIA WITH MANIPULATION OF SHOULDER Right 09/15/2014   Procedure: RIGHT SHOULDER MANIPULATION UNDER ANESTHESIA;  Surgeon: Ninetta Lights, MD;  Location: [place];  Service: Orthopedics;  Laterality: Right;  . HEMORRHOID SURGERY  08/2006  . SHOULDER ARTHROSCOPY WITH DISTAL CLAVICLE RESECTION Left 07/07/2015   Procedure: SHOULDER ARTHROSCOPY WITH DISTAL CLAVICLE RESECTION;  Surgeon: Kathryne Hitch, MD;  Location: [place];  Service: Orthopedics;  Laterality: Left;  . SHOULDER ARTHROSCOPY WITH ROTATOR CUFF REPAIR Left 12/07/2015   Procedure: LEFT SHOULDER ARTHROSCOPY  DEBRIDEMENT, WITH ROTATOR CUFF REPAIR;  Surgeon: Ninetta Lights, MD;  Location: [place];  Service: Orthopedics;  Laterality: Left;  . SHOULDER ARTHROSCOPY WITH ROTATOR CUFF REPAIR AND SUBACROMIAL DECOMPRESSION Left 07/07/2015   Procedure: LEFT SHOULDER SCOPE DEBRIDEMENT, SUBACROMIAL DECOMPRESSION, DISTAL CLAVICULECTOMY, ROTATOR CUFF REPAIR  ;  Surgeon: Kathryne Hitch, MD;  Location: [place];  Service: Orthopedics;  Laterality: Left;  ANESTHESIA: GENERAL, PRE/POST OP SCALENE  . SHOULDER ARTHROSCOPY WITH SUBACROMIAL DECOMPRESSION, ROTATOR CUFF REPAIR AND BICEP TENDON REPAIR Right 03/10/2014   Procedure: RIGHT SHOULDER ARTHROSCOPY WITH SUBACROMIAL DECOMPRESSION, PARTIAL ACROMIOPLASTY WITH CORACOAROMIAL LABRUM DEBRIDEMENT RELEASE DISTAL CLAVICULECTOMY,  ROTATOR CUFF REPAIR AND EXTENSIVE DEBRIDEMENT;  Surgeon: Ninetta Lights, MD;  Location: [place];  Service: Orthopedics;  Laterality: Right;  . TENDON REPAIR  June 06, 2011   right elbow, Dr. Percell Miller    There were no vitals filed for this visit.      Subjective Assessment - 08/27/16 0932    Subjective no lifting boxes or weed trimming x 6 weeks.  MD please so far with progress; able to progress into phase 2 but SLOWLY   Patient is accompained by: Family member   Pertinent History 2 previous RTC repairs   Limitations Lifting;House hold activities   Patient Stated Goals return to work and full recovery   Currently in Pain? Yes   Pain Score 0-No pain  [facility] PT Assessment - 08/27/16 0950      AROM   Left Shoulder Flexion 159 Degrees   Left Shoulder ABduction 80 Degrees  tested in sitting   Left Shoulder Internal Rotation 80 Degrees   Left Shoulder External Rotation 60 Degrees                     [facility] Adult PT Treatment/Exercise - 08/27/16 0934      Shoulder Exercises: Supine   Flexion Left;10 reps;Weights   Shoulder Flexion Weight (lbs) 3   Flexion Limitations  2 sets     Shoulder Exercises: Sidelying   External Rotation Left;10 reps;Weights   External Rotation Weight (lbs) 2   External Rotation Limitations 2 sets   ABduction Left;10 reps;Weights   ABduction Weight (lbs) 2   ABduction Limitations to 90 degrees, 2 sets     Shoulder Exercises: Standing   External Rotation Left;15 reps;Theraband   Theraband Level (Shoulder External Rotation) Level 1 (Yellow)   Internal Rotation Left;15 reps;Theraband   Theraband Level (Shoulder Internal Rotation) Level 1 (Yellow)   Flexion Left;15 reps;Theraband   Theraband Level (Shoulder Flexion) Level 1 (Yellow)   ABduction Left;15 reps;Theraband   Theraband Level (Shoulder ABduction) Level 1 (Yellow)   ABduction Limitations to ~ 40 degrees with 5 sec hold   Extension Left;15 reps;Theraband   Theraband Level (Shoulder Extension) Level 1 (Yellow)   Extension Limitations 5 sec hold   Other Standing Exercises isometric flexion and abduction hold 5x10 sec with 1#; flexion at 90 degrees; abdct at 40 degrees     Shoulder Exercises: Pulleys   Flexion 3 minutes   ABduction 3 minutes                PT Education - 08/27/16 1013    Education provided Yes   Education Details theraband HEP   Person(s) Educated Patient   Methods Explanation;Demonstration;Handout   Comprehension Verbalized understanding;Returned demonstration          PT Short Term Goals - 08/22/16 1348      PT SHORT TERM GOAL #2   Title improve L shoulder AROM external rotation to at least 70 degrees for improved function (09/19/16)   Time 4   Period Weeks   Status New           PT Long Term Goals - 08/22/16 1341      PT LONG TERM GOAL #1   Title Full PROM in order to progress into Phase II rehab (08/22/16)   Baseline external rotation not yet me   Status Partially Met     PT LONG TERM GOAL #2   Title L shoulder AROM: flexion 120; abduction at least 90; and ir/er at least 60 without substitution for progression into  phase II (08/22/16)   Baseline all met except er only 56 degrees (4 degrees from goal)   Status Achieved     PT LONG TERM GOAL #3   Title demonstrate 4/5 strength in L shoulder for improved function (10/17/16)   Time 8   Period Weeks   Status New     PT LONG TERM GOAL #4   Title report ability to sleep on L side with pain < 3/10 for improved function (10/17/16)   Time 8   Period Weeks   Status New               Plan - 08/27/16 1013    Clinical Impression Statement Pt now able to slowly progress into  phase 2 per MD orders.  Tolerated strengthening exercises well today.  Will continue to benefit from PT to maximize function.   PT Treatment/Interventions ADLs/Self Care Home Management;Cryotherapy;Electrical Stimulation;Moist Heat;Ultrasound;Patient/family education;Neuromuscular re-education;Therapeutic exercise;Therapeutic activities;Functional mobility training;Manual techniques;Dry needling;Taping;Passive range of motion;Vasopneumatic Device   PT Next Visit Plan continue strengthening; progress slowly   Consulted and Agree with Plan of Care Patient      Patient will benefit from skilled therapeutic intervention in order to improve the following deficits and impairments:  Postural dysfunction, Decreased strength, Impaired UE functional use, Pain, Decreased range of motion, Decreased scar mobility  Visit Diagnosis: Pain in left shoulder  Stiffness of left shoulder, not elsewhere classified     Problem List Patient Active Problem List   Diagnosis Date Noted  . Dyslipidemia (high LDL; low HDL) 08/15/2016  . Complete rotator cuff tear of left shoulder 07/05/2015  . Tennis elbow 05/30/2014  . Rotator cuff (capsule) sprain 03/10/2014  . Right shoulder pain 02/11/2014  . Memory difficulty 12/18/2012  . Gynecomastia, male 07/30/2012  . OSA (obstructive sleep apnea) 04/23/2011  . EXTERNAL HEMORRHOIDS 02/12/2011  . Prostate cancer ([place]) 11/12/2010  . PLANTAR FASCIITIS  04/09/2010  . Hypothyroidism 12/08/2009  . NEOPLASM OF UNCERTAIN BEHAVIOR OF SKIN 03/10/2009  . OSTEOARTHRITIS 02/14/2009  . HEARING DEFICIT 02/08/2009  . Essential hypertension 02/08/2009  . ALLERGIC RHINITIS 02/08/2009  . GERD 02/08/2009  . PROTEINURIA 02/08/2009  . DIVERTICULITIS, HX OF 02/08/2009      Laureen Abrahams, PT, DPT 08/27/16 10:16 AM    [facility] [address]  [place], [place], [address] Phone: [phone]   Fax:  [phone]  Name: BRIYAN KLEVEN MRN: [id number] Date of Birth: February 09, 1951

## 2016-08-28 ENCOUNTER — Other Ambulatory Visit: Payer: Self-pay | Admitting: *Deleted

## 2016-08-28 DIAGNOSIS — K219 Gastro-esophageal reflux disease without esophagitis: Secondary | ICD-10-CM

## 2016-08-28 MED ORDER — OMEPRAZOLE 40 MG PO CPDR: 40.0000 mg | DELAYED_RELEASE_CAPSULE | Freq: Every day | ORAL | 5 refills | Status: DC

## 2016-08-28 MED FILL — OMEPRAZOLE DR 40 MG CAPSULE: 40 | 30 days supply | Qty: 30 | Fill #0

## 2016-08-28 NOTE — Telephone Encounter (Signed)
Rx sent to the pharmacy by e-script.//AB/CMA 

## 2016-08-30 ENCOUNTER — Ambulatory Visit: Payer: 59

## 2016-08-30 DIAGNOSIS — M25512 Pain in left shoulder: Secondary | ICD-10-CM

## 2016-08-30 DIAGNOSIS — M25612 Stiffness of left shoulder, not elsewhere classified: Secondary | ICD-10-CM

## 2016-08-30 NOTE — Therapy (Signed)
[facility] [address]  [place], [place], [address] Phone: [phone]   Fax:  [phone]  Physical Therapy Treatment  Patient Details  Name: Zachary [name] MRN: [id number] Date of Birth: [birth year] Referring Provider: Dr. Corky Mull  Encounter Date: 08/30/2016      PT End of Session - 08/30/16 0938    Visit Number 18   Number of Visits 32   Date for PT Re-Evaluation 10/17/16   PT Start Time 0935   PT Stop Time 1015   PT Time Calculation (min) 40 min   Activity Tolerance Patient tolerated treatment well   Behavior During Therapy [facility] for tasks assessed/performed      Past Medical History:  Diagnosis Date  . Arthritis   . Articular cartilage disease    left shoulder  . Cancer ([place]) 2015   positive prostate cancer bx  . Diverticulitis   . Dyslipidemia (high LDL; low HDL) 08/15/2016  . GERD (gastroesophageal reflux disease)   . Hearing problem    hearing deficit  . Hemorrhoids   . Hyperlipidemia   . Hypertension   . Hypothyroidism   . Insomnia   . Sleep apnea    uses a cpap  . Wears glasses   . Wears hearing aid    both ears    Past Surgical History:  Procedure Laterality Date  . BACK SURGERY  1978   herniated disksurgery  . CARDIAC CATHETERIZATION  08/01/2010  . COLONOSCOPY    . EXAM UNDER ANESTHESIA WITH MANIPULATION OF SHOULDER Right 09/15/2014   Procedure: RIGHT SHOULDER MANIPULATION UNDER ANESTHESIA;  Surgeon: Ninetta Lights, MD;  Location: [place];  Service: Orthopedics;  Laterality: Right;  . HEMORRHOID SURGERY  08/2006  . SHOULDER ARTHROSCOPY WITH DISTAL CLAVICLE RESECTION Left 07/07/2015   Procedure: SHOULDER ARTHROSCOPY WITH DISTAL CLAVICLE RESECTION;  Surgeon: Kathryne Hitch, MD;  Location: [place];  Service: Orthopedics;  Laterality: Left;  . SHOULDER ARTHROSCOPY WITH ROTATOR CUFF REPAIR Left 12/07/2015   Procedure: LEFT SHOULDER ARTHROSCOPY  DEBRIDEMENT, WITH ROTATOR CUFF REPAIR;  Surgeon: Ninetta Lights, MD;  Location: [place];  Service: Orthopedics;  Laterality: Left;  . SHOULDER ARTHROSCOPY WITH ROTATOR CUFF REPAIR AND SUBACROMIAL DECOMPRESSION Left 07/07/2015   Procedure: LEFT SHOULDER SCOPE DEBRIDEMENT, SUBACROMIAL DECOMPRESSION, DISTAL CLAVICULECTOMY, ROTATOR CUFF REPAIR  ;  Surgeon: Kathryne Hitch, MD;  Location: [place];  Service: Orthopedics;  Laterality: Left;  ANESTHESIA: GENERAL, PRE/POST OP SCALENE  . SHOULDER ARTHROSCOPY WITH SUBACROMIAL DECOMPRESSION, ROTATOR CUFF REPAIR AND BICEP TENDON REPAIR Right 03/10/2014   Procedure: RIGHT SHOULDER ARTHROSCOPY WITH SUBACROMIAL DECOMPRESSION, PARTIAL ACROMIOPLASTY WITH CORACOAROMIAL LABRUM DEBRIDEMENT RELEASE DISTAL CLAVICULECTOMY,  ROTATOR CUFF REPAIR AND EXTENSIVE DEBRIDEMENT;  Surgeon: Ninetta Lights, MD;  Location: [place];  Service: Orthopedics;  Laterality: Right;  . TENDON REPAIR  June 06, 2011   right elbow, Dr. Percell Miller    There were no vitals filed for this visit.      Subjective Assessment - 08/30/16 0938    Subjective Pt. reports L shoulder is feeling ok at this point.     Patient Stated Goals return to work and full recovery   Currently in Pain? No/denies   Pain Score 0-No pain   Multiple Pain Sites No        Today's treatment:  Therex: L shoulder IR/ER with yellow TB x 15 reps  L shoulder pulleys flexion x 2 min  L  shoulder pulleys scaption x 2 min R sidelying:          L shoulder ER with 1# 2 x 10 reps; 2nd set with 2#          L shoulder abduction with 1# (to 90 dg) 2 x 10 reps; 2nd set with 2#   Manual:  L shoulder gentle PROM all directions L shoulder gentle anterior mobs  L shoulder STM to medial scapular border         Therex: B scapular protraction with 2# x 25 reps B shoulder flexion with wand and 3# x 12 reps Supine L shoulder IR with yellow TB x 15 reps  Supine L shoulder ER with  yellow TB x 15 reps  Standing L shoulder ext. with yellow TB x 15 reps            PT Short Term Goals - 08/30/16 0940      PT SHORT TERM GOAL #2   Title improve L shoulder AROM external rotation to at least 70 degrees for improved function (09/19/16)   Time 4   Period Weeks   Status On-going           PT Long Term Goals - 08/30/16 0941      PT LONG TERM GOAL #1   Title Full PROM in order to progress into Phase II rehab (08/22/16)   Baseline external rotation not yet me   Status Partially Met     PT LONG TERM GOAL #2   Title L shoulder AROM: flexion 120; abduction at least 90; and ir/er at least 60 without substitution for progression into phase II (08/22/16)   Baseline all met except er only 56 degrees (4 degrees from goal)   Status Achieved     PT LONG TERM GOAL #3   Title demonstrate 4/5 strength in L shoulder for improved function (10/17/16)   Time 8   Period Weeks   Status On-going     PT LONG TERM GOAL #4   Title report ability to sleep on L side with pain < 3/10 for improved function (10/17/16)   Time 8   Period Weeks   Status On-going               Plan - 08/30/16 0940    Clinical Impression Statement Pt. pain free initially today.  Conservative, gentle L shoulder strengthening activity continued today and was tolerated well.  Pt. still with L shoulder pain up to 6/10 with eccentric lowering on AAROM peg board from flexed position today; this pain self-resolved quickly however.  Pt. able to perform L shoulder movements without scapular elevation with therex today and continues to progress well per protocol.     PT Treatment/Interventions ADLs/Self Care Home Management;Cryotherapy;Electrical Stimulation;Moist Heat;Ultrasound;Patient/family education;Neuromuscular re-education;Therapeutic exercise;Therapeutic activities;Functional mobility training;Manual techniques;Dry needling;Taping;Passive range of motion;Vasopneumatic Device   PT Next Visit Plan  continue strengthening; progress slowly      Patient will benefit from skilled therapeutic intervention in order to improve the following deficits and impairments:  Postural dysfunction, Decreased strength, Impaired UE functional use, Pain, Decreased range of motion, Decreased scar mobility  Visit Diagnosis: Pain in left shoulder  Stiffness of left shoulder, not elsewhere classified     Problem List Patient Active Problem List   Diagnosis Date Noted  . Dyslipidemia (high LDL; low HDL) 08/15/2016  . Complete rotator cuff tear of left shoulder 07/05/2015  . Tennis elbow 05/30/2014  . Rotator cuff (capsule) sprain 03/10/2014  . Right  shoulder pain 02/11/2014  . Memory difficulty 12/18/2012  . Gynecomastia, male 07/30/2012  . OSA (obstructive sleep apnea) 04/23/2011  . EXTERNAL HEMORRHOIDS 02/12/2011  . Prostate cancer ([place]) 11/12/2010  . PLANTAR FASCIITIS 04/09/2010  . Hypothyroidism 12/08/2009  . NEOPLASM OF UNCERTAIN BEHAVIOR OF SKIN 03/10/2009  . OSTEOARTHRITIS 02/14/2009  . HEARING DEFICIT 02/08/2009  . Essential hypertension 02/08/2009  . ALLERGIC RHINITIS 02/08/2009  . GERD 02/08/2009  . PROTEINURIA 02/08/2009  . DIVERTICULITIS, HX OF 02/08/2009    Bess Harvest, PTA 08/30/2016, 10:43 AM  [facility] [address]  [place], [place], [address] Phone: [phone]   Fax:  [phone]  Name: Zachary [name] MRN: [id number] Date of Birth: September 04, 1951

## 2016-09-02 ENCOUNTER — Encounter: Payer: Self-pay | Admitting: Family Medicine

## 2016-09-02 ENCOUNTER — Ambulatory Visit (INDEPENDENT_AMBULATORY_CARE_PROVIDER_SITE_OTHER): Payer: 59 | Admitting: Family Medicine

## 2016-09-02 ENCOUNTER — Ambulatory Visit: Payer: 59 | Admitting: Physical Therapy

## 2016-09-02 VITALS — BP 120/72 | HR 78 | Temp 98.1°F | Ht 68.0 in | Wt 207.8 lb

## 2016-09-02 DIAGNOSIS — R05 Cough: Secondary | ICD-10-CM | POA: Diagnosis not present

## 2016-09-02 DIAGNOSIS — R0982 Postnasal drip: Secondary | ICD-10-CM | POA: Diagnosis not present

## 2016-09-02 DIAGNOSIS — M25612 Stiffness of left shoulder, not elsewhere classified: Secondary | ICD-10-CM

## 2016-09-02 DIAGNOSIS — M25512 Pain in left shoulder: Secondary | ICD-10-CM | POA: Diagnosis not present

## 2016-09-02 DIAGNOSIS — R059 Cough, unspecified: Secondary | ICD-10-CM

## 2016-09-02 NOTE — Therapy (Signed)
[facility] [address]  [place], [place], [address] Phone: [phone]   Fax:  [phone]  Physical Therapy Treatment  Patient Details  Name: Zachary [name] MRN: [id number] Date of Birth: 17-Nov-1951 Referring Provider: Dr. Corky Mull  Encounter Date: 09/02/2016      PT End of Session - 09/02/16 0925    Visit Number 19   Number of Visits 32   Date for PT Re-Evaluation 10/17/16   PT Start Time 0845   PT Stop Time 0925   PT Time Calculation (min) 40 min   Activity Tolerance Patient tolerated treatment well   Behavior During Therapy [facility] for tasks assessed/performed      Past Medical History:  Diagnosis Date  . Arthritis   . Articular cartilage disease    left shoulder  . Cancer ([place]) 2015   positive prostate cancer bx  . Diverticulitis   . Dyslipidemia (high LDL; low HDL) 08/15/2016  . GERD (gastroesophageal reflux disease)   . Hearing problem    hearing deficit  . Hemorrhoids   . Hyperlipidemia   . Hypertension   . Hypothyroidism   . Insomnia   . Sleep apnea    uses a cpap  . Wears glasses   . Wears hearing aid    both ears    Past Surgical History:  Procedure Laterality Date  . BACK SURGERY  1978   herniated disksurgery  . CARDIAC CATHETERIZATION  08/01/2010  . COLONOSCOPY    . EXAM UNDER ANESTHESIA WITH MANIPULATION OF SHOULDER Right 09/15/2014   Procedure: RIGHT SHOULDER MANIPULATION UNDER ANESTHESIA;  Surgeon: Ninetta Lights, MD;  Location: [facility] [place];  Service: Orthopedics;  Laterality: Right;  . HEMORRHOID SURGERY  08/2006  . SHOULDER ARTHROSCOPY WITH DISTAL CLAVICLE RESECTION Left 07/07/2015   Procedure: SHOULDER ARTHROSCOPY WITH DISTAL CLAVICLE RESECTION;  Surgeon: Kathryne Hitch, MD;  Location: [place];  Service: Orthopedics;  Laterality: Left;  . SHOULDER ARTHROSCOPY WITH ROTATOR CUFF REPAIR Left 12/07/2015   Procedure: LEFT SHOULDER ARTHROSCOPY  DEBRIDEMENT, WITH ROTATOR CUFF REPAIR;  Surgeon: Ninetta Lights, MD;  Location: [place];  Service: Orthopedics;  Laterality: Left;  . SHOULDER ARTHROSCOPY WITH ROTATOR CUFF REPAIR AND SUBACROMIAL DECOMPRESSION Left 07/07/2015   Procedure: LEFT SHOULDER SCOPE DEBRIDEMENT, SUBACROMIAL DECOMPRESSION, DISTAL CLAVICULECTOMY, ROTATOR CUFF REPAIR  ;  Surgeon: Kathryne Hitch, MD;  Location: [place];  Service: Orthopedics;  Laterality: Left;  ANESTHESIA: GENERAL, PRE/POST OP SCALENE  . SHOULDER ARTHROSCOPY WITH SUBACROMIAL DECOMPRESSION, ROTATOR CUFF REPAIR AND BICEP TENDON REPAIR Right 03/10/2014   Procedure: RIGHT SHOULDER ARTHROSCOPY WITH SUBACROMIAL DECOMPRESSION, PARTIAL ACROMIOPLASTY WITH CORACOAROMIAL LABRUM DEBRIDEMENT RELEASE DISTAL CLAVICULECTOMY,  ROTATOR CUFF REPAIR AND EXTENSIVE DEBRIDEMENT;  Surgeon: Ninetta Lights, MD;  Location: [place];  Service: Orthopedics;  Laterality: Right;  . TENDON REPAIR  June 06, 2011   right elbow, Dr. Percell Miller    There were no vitals filed for this visit.      Subjective Assessment - 09/02/16 0848    Subjective shoulder is "alright."  no change recently.  still having problems sleeping   Patient is accompained by: Family member   Pertinent History 2 previous RTC repairs   Limitations Lifting;House hold activities   Patient Stated Goals return to work and full recovery   Currently in Pain? No/denies  [facility] Adult PT Treatment/Exercise - 09/02/16 0848      Shoulder Exercises: Supine   Protraction Left;15 reps;Weights  2 sets   Protraction Weight (lbs) 3   External Rotation Left;15 reps;Theraband  2 sets   Theraband Level (Shoulder External Rotation) Level 2 (Red)   Internal Rotation Left;15 reps;Theraband  2 sets   Theraband Level (Shoulder Internal Rotation) Level 2 (Red)   Flexion Left;15 reps;Weights  2 sets   Shoulder Flexion Weight (lbs) 3     Shoulder  Exercises: Sidelying   External Rotation Left;15 reps;Weights   External Rotation Weight (lbs) 2   External Rotation Limitations 2 sets   ABduction Left;15 reps;Weights   ABduction Weight (lbs) 2   ABduction Limitations 2 sets; to tolerance     Shoulder Exercises: Standing   Other Standing Exercises TRX: rows, bicep curls, reverse delt fly x 15 each   Other Standing Exercises wall ladder flexion and scaption x 10     Shoulder Exercises: Pulleys   Flexion 3 minutes   ABduction 3 minutes   ABduction Limitations scaption     Shoulder Exercises: ROM/Strengthening   Cybex Row Limitations 25# x 15 both grips   Rhythmic Stabilization, Supine CW/CCW x 15, A-Z with yellow med ball                  PT Short Term Goals - 08/30/16 0940      PT SHORT TERM GOAL #2   Title improve L shoulder AROM external rotation to at least 70 degrees for improved function (09/19/16)   Time 4   Period Weeks   Status On-going           PT Long Term Goals - 08/30/16 0941      PT LONG TERM GOAL #1   Title Full PROM in order to progress into Phase II rehab (08/22/16)   Baseline external rotation not yet me   Status Partially Met     PT LONG TERM GOAL #2   Title L shoulder AROM: flexion 120; abduction at least 90; and ir/er at least 60 without substitution for progression into phase II (08/22/16)   Baseline all met except er only 56 degrees (4 degrees from goal)   Status Achieved     PT LONG TERM GOAL #3   Title demonstrate 4/5 strength in L shoulder for improved function (10/17/16)   Time 8   Period Weeks   Status On-going     PT LONG TERM GOAL #4   Title report ability to sleep on L side with pain < 3/10 for improved function (10/17/16)   Time 8   Period Weeks   Status On-going               Plan - 09/02/16 0925    Clinical Impression Statement Pt tolerated session well today with soreness only after session.  Will continue to benefit from PT to maximize function.    Clinical Impairments Affecting Rehab Potential failed RTC repair x 2   PT Treatment/Interventions ADLs/Self Care Home Management;Cryotherapy;Electrical Stimulation;Moist Heat;Ultrasound;Patient/family education;Neuromuscular re-education;Therapeutic exercise;Therapeutic activities;Functional mobility training;Manual techniques;Dry needling;Taping;Passive range of motion;Vasopneumatic Device   PT Next Visit Plan continue strengthening; progress slowly   Consulted and Agree with Plan of Care Patient      Patient will benefit from skilled therapeutic intervention in order to improve the following deficits and impairments:  Postural dysfunction, Decreased strength, Impaired UE functional use, Pain, Decreased range of motion, Decreased scar mobility  Visit Diagnosis: Pain in  left shoulder  Stiffness of left shoulder, not elsewhere classified     Problem List Patient Active Problem List   Diagnosis Date Noted  . Dyslipidemia (high LDL; low HDL) 08/15/2016  . Complete rotator cuff tear of left shoulder 07/05/2015  . Tennis elbow 05/30/2014  . Rotator cuff (capsule) sprain 03/10/2014  . Right shoulder pain 02/11/2014  . Memory difficulty 12/18/2012  . Gynecomastia, male 07/30/2012  . OSA (obstructive sleep apnea) 04/23/2011  . EXTERNAL HEMORRHOIDS 02/12/2011  . Prostate cancer ([place]) 11/12/2010  . PLANTAR FASCIITIS 04/09/2010  . Hypothyroidism 12/08/2009  . NEOPLASM OF UNCERTAIN BEHAVIOR OF SKIN 03/10/2009  . OSTEOARTHRITIS 02/14/2009  . HEARING DEFICIT 02/08/2009  . Essential hypertension 02/08/2009  . ALLERGIC RHINITIS 02/08/2009  . GERD 02/08/2009  . PROTEINURIA 02/08/2009  . DIVERTICULITIS, HX OF 02/08/2009       Laureen Abrahams, PT, DPT 09/02/16 9:26 AM    [facility] [address]  [place], [place], [address] Phone: [phone]   Fax:  [phone]  Name: Zachary [name] MRN: [id number] Date of  Birth: [birth year]

## 2016-09-02 NOTE — Progress Notes (Signed)
Pre visit review using our clinic review tool, if applicable. No additional management support is needed unless otherwise documented below in the visit note. 

## 2016-09-02 NOTE — Progress Notes (Signed)
Chief Complaint  Patient presents with  . Follow-up    3 weeks on cough-pt states doing good    Subjective: Patient is a 65 y.o. male here for f/u cough.  Seen 3 weeks ago and is doing better. He was started on an anti-allergy regimen. He has been using the Flonase prn instead of daily. No issues with spray. Still having some PND and subsequently some cough. Since it is better, he does not desire any further work up. No fevers or SOB.   ROS: HEENT: +PND Lungs: Denies SOB, +cough  Family History  Problem Relation Age of Onset  . Heart disease Father   . Breast cancer Mother   . Cancer Brother   . Alcohol abuse Other   . Arthritis Other   . Cancer Other     Breast, Prostate  . Coronary artery disease Other   . Irritable bowel syndrome Other   . Cystic fibrosis Other    Past Medical History:  Diagnosis Date  . Arthritis   . Articular cartilage disease    left shoulder  . Cancer ([place]) 2015   positive prostate cancer bx  . Diverticulitis   . Dyslipidemia (high LDL; low HDL) 08/15/2016  . GERD (gastroesophageal reflux disease)   . Hearing problem    hearing deficit  . Hemorrhoids   . Hyperlipidemia   . Hypertension   . Hypothyroidism   . Insomnia   . Sleep apnea    uses a cpap  . Wears glasses   . Wears hearing aid    both ears   Allergies  Allergen Reactions  . Oxycodone Itching  . Simvastatin     REACTION: muscle pain    Current Outpatient Prescriptions:  .  acetaminophen (TYLENOL) 500 MG tablet, Take 500 mg by mouth every 6 (six) hours as needed., Disp: , Rfl:  .  aspirin EC 81 MG EC tablet, Take 81 mg by mouth daily.  , Disp: , Rfl:  .  fluticasone (FLONASE) 50 MCG/ACT nasal spray, Place 2 sprays into both nostrils daily., Disp: 16 g, Rfl: 6 .  levothyroxine (SYNTHROID, LEVOTHROID) 100 MCG tablet, TAKE 1 TABLET (100 MCG TOTAL) BY MOUTH DAILY., Disp: 90 tablet, Rfl: 1 .  lisinopril (PRINIVIL,ZESTRIL) 5 MG tablet, Take 1 tablet (5 mg total) by mouth daily.,  Disp: 90 tablet, Rfl: 1 .  loratadine (CLARITIN) 10 MG tablet, Take 10 mg by mouth daily.  , Disp: , Rfl:  .  naproxen sodium (ANAPROX) 220 MG tablet, Take 220 mg by mouth 2 (two) times daily with a meal. Reported on 06/27/2016, Disp: , Rfl:  .  omeprazole (PRILOSEC) 40 MG capsule, Take 1 capsule (40 mg total) by mouth daily., Disp: 30 capsule, Rfl: 5 .  pravastatin (PRAVACHOL) 40 MG tablet, Take 0.5 tablets (20 mg total) by mouth daily. NEED FOLLOW UP VISIT IN September., Disp: 45 tablet, Rfl: 2 .  predniSONE (DELTASONE) 10 MG tablet, Take 3 tablets (30 mg total) by mouth daily with breakfast., Disp: 15 tablet, Rfl: 0 .  HYDROmorphone (DILAUDID) 2 MG tablet, Take 1-2 tabs po q4-6 hours prn pain (Patient not taking: Reported on 09/02/2016), Disp: 60 tablet, Rfl: 0  Objective: BP 120/72 (BP Location: Right Arm, Patient Position: Sitting, Cuff Size: Large)   Pulse 78   Temp 98.1 F (36.7 C) (Oral)   Ht 5\' 8"  (1.727 m)   Wt 207 lb 12.8 oz (94.3 kg)   SpO2 99%   BMI 31.60 kg/m  General: Awake, appears  stated age 47: MMM, no exudate or erythema in pharynx; nares patent without discharge Heart: RRR, no murmurs, no LE edema Lungs: CTAB, no rales, wheezes or rhonchi. Normal effort Psych: Age appropriate judgment and insight, normal affect and mood  Assessment and Plan: PND (post-nasal drip)  Cough  Cough resolving. Will keep medications the same, avoid PFT's for now. Counseled on using spray daily rather than prn for maximum benefit. Wanted to have labs done, apparently orders weren't in. They were according to Epic, but will reorder if there is an issue.  F/u pending the results of his labs.  The patient voiced understanding and agreement to the plan.  [place], DO 09/02/16  2:19 PM

## 2016-09-02 NOTE — Patient Instructions (Signed)
Keep using Flonase daily for the next 2 months. 2 sprays each nostril once a day.

## 2016-09-05 ENCOUNTER — Ambulatory Visit: Payer: 59 | Admitting: Physical Therapy

## 2016-09-05 ENCOUNTER — Other Ambulatory Visit: Payer: 59

## 2016-09-05 DIAGNOSIS — I10 Essential (primary) hypertension: Secondary | ICD-10-CM | POA: Diagnosis not present

## 2016-09-05 DIAGNOSIS — M25612 Stiffness of left shoulder, not elsewhere classified: Secondary | ICD-10-CM | POA: Diagnosis not present

## 2016-09-05 DIAGNOSIS — Z114 Encounter for screening for human immunodeficiency virus [HIV]: Secondary | ICD-10-CM | POA: Diagnosis not present

## 2016-09-05 DIAGNOSIS — M25512 Pain in left shoulder: Secondary | ICD-10-CM | POA: Diagnosis not present

## 2016-09-05 LAB — LIPID PANEL
CHOLESTEROL: 174 mg/dL (ref 0–200)
HDL: 35.4 mg/dL — AB (ref >39.00)
LDL Cholesterol: 107 mg/dL — ABNORMAL HIGH (ref 0–99)
NonHDL: 138.35
TRIGLYCERIDES: 156 mg/dL — AB (ref 0.0–149.0)
Total CHOL/HDL Ratio: 5
VLDL: 31.2 mg/dL (ref 0.0–40.0)

## 2016-09-05 LAB — COMPLETE METABOLIC PANEL WITH GFR
ALT: 28 U/L (ref 9–46)
AST: 25 U/L (ref 10–35)
Albumin: 4.3 g/dL (ref 3.6–5.1)
Alkaline Phosphatase: 50 U/L (ref 40–115)
BUN: 19 mg/dL (ref 7–25)
CHLORIDE: 106 mmol/L (ref 98–110)
CO2: 24 mmol/L (ref 20–31)
CREATININE: 0.99 mg/dL (ref 0.70–1.25)
Calcium: 9.9 mg/dL (ref 8.6–10.3)
GFR, Est Non African American: 81 mL/min (ref >=60)
Glucose, Bld: 89 mg/dL (ref 65–99)
Potassium: 4.5 mmol/L (ref 3.5–5.3)
Sodium: 139 mmol/L (ref 135–146)
Total Bilirubin: 0.5 mg/dL (ref 0.2–1.2)
Total Protein: 6.7 g/dL (ref 6.1–8.1)

## 2016-09-05 LAB — HEMOGLOBIN A1C: Hgb A1c MFr Bld: 5.8 % (ref 4.6–6.5)

## 2016-09-05 LAB — TSH: TSH: 3.8 u[IU]/mL (ref 0.35–4.50)

## 2016-09-05 LAB — MICROALBUMIN / CREATININE URINE RATIO
CREATININE, U: 274.1 mg/dL
MICROALB UR: 1.6 mg/dL (ref 0.0–1.9)
MICROALB/CREAT RATIO: 0.6 mg/g (ref 0.0–30.0)

## 2016-09-05 LAB — PSA: PSA: 11.22 ng/mL — AB (ref 0.10–4.00)

## 2016-09-05 LAB — HIV ANTIBODY (ROUTINE TESTING W REFLEX): HIV 1&2 Ab, 4th Generation: NONREACTIVE

## 2016-09-05 NOTE — Therapy (Signed)
[facility] [address]  [place], [place], [address] Phone: [phone]   Fax:  [phone]  Physical Therapy Treatment  Patient Details  Name: Zachary [name] MRN: [id number] Date of Birth: Oct 28, 1951 Referring Provider: Dr. Corky Mull  Encounter Date: 09/05/2016      PT End of Session - 09/05/16 1127    Visit Number 20   Number of Visits 32   Date for PT Re-Evaluation 10/17/16   PT Start Time 0933   PT Stop Time 1014   PT Time Calculation (min) 41 min   Activity Tolerance Patient tolerated treatment well   Behavior During Therapy [facility] for tasks assessed/performed      Past Medical History:  Diagnosis Date  . Arthritis   . Articular cartilage disease    left shoulder  . Cancer ([place]) 2015   positive prostate cancer bx  . Diverticulitis   . Dyslipidemia (high LDL; low HDL) 08/15/2016  . GERD (gastroesophageal reflux disease)   . Hearing problem    hearing deficit  . Hemorrhoids   . Hyperlipidemia   . Hypertension   . Hypothyroidism   . Insomnia   . Sleep apnea    uses a cpap  . Wears glasses   . Wears hearing aid    both ears    Past Surgical History:  Procedure Laterality Date  . BACK SURGERY  1978   herniated disksurgery  . CARDIAC CATHETERIZATION  08/01/2010  . COLONOSCOPY    . EXAM UNDER ANESTHESIA WITH MANIPULATION OF SHOULDER Right 09/15/2014   Procedure: RIGHT SHOULDER MANIPULATION UNDER ANESTHESIA;  Surgeon: Ninetta Lights, MD;  Location: [place];  Service: Orthopedics;  Laterality: Right;  . HEMORRHOID SURGERY  08/2006  . SHOULDER ARTHROSCOPY WITH DISTAL CLAVICLE RESECTION Left 07/07/2015   Procedure: SHOULDER ARTHROSCOPY WITH DISTAL CLAVICLE RESECTION;  Surgeon: Kathryne Hitch, MD;  Location: [place];  Service: Orthopedics;  Laterality: Left;  . SHOULDER ARTHROSCOPY WITH ROTATOR CUFF REPAIR Left 12/07/2015   Procedure: LEFT SHOULDER ARTHROSCOPY  DEBRIDEMENT, WITH ROTATOR CUFF REPAIR;  Surgeon: Ninetta Lights, MD;  Location: [place];  Service: Orthopedics;  Laterality: Left;  . SHOULDER ARTHROSCOPY WITH ROTATOR CUFF REPAIR AND SUBACROMIAL DECOMPRESSION Left 07/07/2015   Procedure: LEFT SHOULDER SCOPE DEBRIDEMENT, SUBACROMIAL DECOMPRESSION, DISTAL CLAVICULECTOMY, ROTATOR CUFF REPAIR  ;  Surgeon: Kathryne Hitch, MD;  Location: [place];  Service: Orthopedics;  Laterality: Left;  ANESTHESIA: GENERAL, PRE/POST OP SCALENE  . SHOULDER ARTHROSCOPY WITH SUBACROMIAL DECOMPRESSION, ROTATOR CUFF REPAIR AND BICEP TENDON REPAIR Right 03/10/2014   Procedure: RIGHT SHOULDER ARTHROSCOPY WITH SUBACROMIAL DECOMPRESSION, PARTIAL ACROMIOPLASTY WITH CORACOAROMIAL LABRUM DEBRIDEMENT RELEASE DISTAL CLAVICULECTOMY,  ROTATOR CUFF REPAIR AND EXTENSIVE DEBRIDEMENT;  Surgeon: Ninetta Lights, MD;  Location: [place];  Service: Orthopedics;  Laterality: Right;  . TENDON REPAIR  June 06, 2011   right elbow, Dr. Percell Miller    There were no vitals filed for this visit.      Subjective Assessment - 09/05/16 0934    Subjective doing okay; shoulder is "alright"   Pertinent History 2 previous RTC repairs   Limitations Lifting;House hold activities   Patient Stated Goals return to work and full recovery   Currently in Pain? No/denies                         [facility] Adult PT Treatment/Exercise - 09/05/16 4103  Shoulder Exercises: Supine   Protraction Left;15 reps;Weights  2 sets   Protraction Weight (lbs) 5   Protraction Limitations 2 sets   Horizontal ABduction Both;15 reps;Theraband   Theraband Level (Shoulder Horizontal ABduction) Level 3 (Green)   Horizontal ABduction Limitations 2 sets   External Rotation Left;15 reps;Theraband   Theraband Level (Shoulder External Rotation) Level 3 (Green)   External Rotation Limitations 2 sets   Flexion Left;15 reps;Weights   Shoulder Flexion Weight (lbs) 3    Flexion Limitations 2 sets     Shoulder Exercises: Standing   Other Standing Exercises TRX: rows x 15,      Shoulder Exercises: Pulleys   Flexion 3 minutes   ABduction 3 minutes   ABduction Limitations scaption     Shoulder Exercises: ROM/Strengthening   UBE (Upper Arm Bike) L1 x 5 min (3' fwd/ 2' bwd)   Cybex Row Limitations 25# x 15 both grips     Shoulder Exercises: Body Blade   Flexion 15 seconds;3 reps   Flexion Limitations seated with bil UE hold   External Rotation 15 seconds;3 reps   Internal Rotation 15 seconds;3 reps     Manual Therapy   Passive ROM supine external rotation L shoulder in 40, 60, 90 degrees abduction (in scapular plane)                  PT Short Term Goals - 08/30/16 0940      PT SHORT TERM GOAL #2   Title improve L shoulder AROM external rotation to at least 70 degrees for improved function (09/19/16)   Time 4   Period Weeks   Status On-going           PT Long Term Goals - 08/30/16 0941      PT LONG TERM GOAL #1   Title Full PROM in order to progress into Phase II rehab (08/22/16)   Baseline external rotation not yet me   Status Partially Met     PT LONG TERM GOAL #2   Title L shoulder AROM: flexion 120; abduction at least 90; and ir/er at least 60 without substitution for progression into phase II (08/22/16)   Baseline all met except er only 56 degrees (4 degrees from goal)   Status Achieved     PT LONG TERM GOAL #3   Title demonstrate 4/5 strength in L shoulder for improved function (10/17/16)   Time 8   Period Weeks   Status On-going     PT LONG TERM GOAL #4   Title report ability to sleep on L side with pain < 3/10 for improved function (10/17/16)   Time 8   Period Weeks   Status On-going               Plan - 09/05/16 1127    Clinical Impression Statement Pt slowly progressing with strengthening exercises without complaints of sharp pain with activity.  Reports some muscle fatigue after session.  Will  continue to benefit from PT to maximize function and decrease risk of reinjury.   Clinical Impairments Affecting Rehab Potential failed RTC repair x 2   PT Treatment/Interventions ADLs/Self Care Home Management;Cryotherapy;Electrical Stimulation;Moist Heat;Ultrasound;Patient/family education;Neuromuscular re-education;Therapeutic exercise;Therapeutic activities;Functional mobility training;Manual techniques;Dry needling;Taping;Passive range of motion;Vasopneumatic Device   PT Next Visit Plan continue strengthening; progress slowly   Consulted and Agree with Plan of Care Patient      Patient will benefit from skilled therapeutic intervention in order to improve the following deficits and impairments:  Postural dysfunction,  Decreased strength, Impaired UE functional use, Pain, Decreased range of motion, Decreased scar mobility  Visit Diagnosis: Pain in left shoulder  Stiffness of left shoulder, not elsewhere classified     Problem List Patient Active Problem List   Diagnosis Date Noted  . Dyslipidemia (high LDL; low HDL) 08/15/2016  . Complete rotator cuff tear of left shoulder 07/05/2015  . Tennis elbow 05/30/2014  . Rotator cuff (capsule) sprain 03/10/2014  . Right shoulder pain 02/11/2014  . Memory difficulty 12/18/2012  . Gynecomastia, male 07/30/2012  . OSA (obstructive sleep apnea) 04/23/2011  . EXTERNAL HEMORRHOIDS 02/12/2011  . Prostate cancer ([place]) 11/12/2010  . PLANTAR FASCIITIS 04/09/2010  . Hypothyroidism 12/08/2009  . NEOPLASM OF UNCERTAIN BEHAVIOR OF SKIN 03/10/2009  . OSTEOARTHRITIS 02/14/2009  . HEARING DEFICIT 02/08/2009  . Essential hypertension 02/08/2009  . ALLERGIC RHINITIS 02/08/2009  . GERD 02/08/2009  . PROTEINURIA 02/08/2009  . DIVERTICULITIS, HX OF 02/08/2009       Laureen Abrahams, PT, DPT 09/05/16 11:30 AM    [facility] [address]  [place], [place], [address] Phone:  [phone]   Fax:  [phone]  Name: Zachary [name] MRN: [id number] Date of Birth: [birth year]

## 2016-09-05 NOTE — Addendum Note (Signed)
Addended by: Peggyann Shoals on: 09/05/2016 07:10 AM   Modules accepted: Orders

## 2016-09-09 ENCOUNTER — Ambulatory Visit: Payer: 59 | Admitting: Physical Therapy

## 2016-09-09 DIAGNOSIS — M25512 Pain in left shoulder: Secondary | ICD-10-CM

## 2016-09-09 DIAGNOSIS — M25612 Stiffness of left shoulder, not elsewhere classified: Secondary | ICD-10-CM

## 2016-09-09 NOTE — Therapy (Signed)
[facility] [address]  [place], [place], [address] Phone: [phone]   Fax:  [phone]  Physical Therapy Treatment  Patient Details  Name: Zachary [name] MRN: [id number] Date of Birth: 10-26-1951 Referring Provider: Dr. Corky Mull  Encounter Date: 09/09/2016      PT End of Session - 09/09/16 0926    Visit Number 21   Number of Visits 32   Date for PT Re-Evaluation 10/17/16   PT Start Time 0846   PT Stop Time 0926   PT Time Calculation (min) 40 min   Activity Tolerance Patient tolerated treatment well   Behavior During Therapy [facility] for tasks assessed/performed      Past Medical History:  Diagnosis Date  . Arthritis   . Articular cartilage disease    left shoulder  . Cancer ([place]) 2015   positive prostate cancer bx  . Diverticulitis   . Dyslipidemia (high LDL; low HDL) 08/15/2016  . GERD (gastroesophageal reflux disease)   . Hearing problem    hearing deficit  . Hemorrhoids   . Hyperlipidemia   . Hypertension   . Hypothyroidism   . Insomnia   . Sleep apnea    uses a cpap  . Wears glasses   . Wears hearing aid    both ears    Past Surgical History:  Procedure Laterality Date  . BACK SURGERY  1978   herniated disksurgery  . CARDIAC CATHETERIZATION  08/01/2010  . COLONOSCOPY    . EXAM UNDER ANESTHESIA WITH MANIPULATION OF SHOULDER Right 09/15/2014   Procedure: RIGHT SHOULDER MANIPULATION UNDER ANESTHESIA;  Surgeon: Ninetta Lights, MD;  Location: [place];  Service: Orthopedics;  Laterality: Right;  . HEMORRHOID SURGERY  08/2006  . SHOULDER ARTHROSCOPY WITH DISTAL CLAVICLE RESECTION Left 07/07/2015   Procedure: SHOULDER ARTHROSCOPY WITH DISTAL CLAVICLE RESECTION;  Surgeon: Kathryne Hitch, MD;  Location: [place];  Service: Orthopedics;  Laterality: Left;  . SHOULDER ARTHROSCOPY WITH ROTATOR CUFF REPAIR Left 12/07/2015   Procedure: LEFT SHOULDER ARTHROSCOPY  DEBRIDEMENT, WITH ROTATOR CUFF REPAIR;  Surgeon: Ninetta Lights, MD;  Location: [place];  Service: Orthopedics;  Laterality: Left;  . SHOULDER ARTHROSCOPY WITH ROTATOR CUFF REPAIR AND SUBACROMIAL DECOMPRESSION Left 07/07/2015   Procedure: LEFT SHOULDER SCOPE DEBRIDEMENT, SUBACROMIAL DECOMPRESSION, DISTAL CLAVICULECTOMY, ROTATOR CUFF REPAIR  ;  Surgeon: Kathryne Hitch, MD;  Location: [place];  Service: Orthopedics;  Laterality: Left;  ANESTHESIA: GENERAL, PRE/POST OP SCALENE  . SHOULDER ARTHROSCOPY WITH SUBACROMIAL DECOMPRESSION, ROTATOR CUFF REPAIR AND BICEP TENDON REPAIR Right 03/10/2014   Procedure: RIGHT SHOULDER ARTHROSCOPY WITH SUBACROMIAL DECOMPRESSION, PARTIAL ACROMIOPLASTY WITH CORACOAROMIAL LABRUM DEBRIDEMENT RELEASE DISTAL CLAVICULECTOMY,  ROTATOR CUFF REPAIR AND EXTENSIVE DEBRIDEMENT;  Surgeon: Ninetta Lights, MD;  Location: [place];  Service: Orthopedics;  Laterality: Right;  . TENDON REPAIR  June 06, 2011   right elbow, Dr. Percell Miller    There were no vitals filed for this visit.      Subjective Assessment - 09/09/16 0847    Subjective shoulder is "alright."  no pain today; had some pain over the weekend while cleaning car (2 hours)   Pertinent History 2 previous RTC repairs   Limitations Lifting;House hold activities   Patient Stated Goals return to work and full recovery   Currently in Pain? No/denies  [facility] Adult PT Treatment/Exercise - 09/09/16 0848      Shoulder Exercises: Supine   Protraction Left;15 reps;Weights  2 sets   Protraction Weight (lbs) 5   External Rotation Left;15 reps;Theraband   Theraband Level (Shoulder External Rotation) Level 3 (Green)   External Rotation Limitations 2 sets   Internal Rotation Left;15 reps;Theraband   Theraband Level (Shoulder Internal Rotation) Level 3 (Green)   Internal Rotation Limitations 2 sets     Shoulder Exercises: Prone   Retraction  Left;15 reps;Weights   Retraction Weight (lbs) 5   Retraction Limitations 3 sec hold; 2 sets   Flexion Left;15 reps;Weights   Flexion Weight (lbs) 2   Flexion Limitations 2 sets (scaption)   Extension Left;15 reps;Weights   Extension Weight (lbs) 5   Extension Limitations 2 sets   External Rotation Left;15 reps;Weights   External Rotation Weight (lbs) 2   Internal Rotation Left;15 reps;Weights   Internal Rotation Weight (lbs) 2   Horizontal ABduction 1 Left;15 reps;Weights   Horizontal ABduction 1 Weight (lbs) 2   Horizontal ABduction 1 Limitations 2 sets     Shoulder Exercises: Standing   External Rotation Left;15 reps;Theraband   Theraband Level (Shoulder External Rotation) Level 2 (Red)   Internal Rotation Right;15 reps;Theraband   Theraband Level (Shoulder Internal Rotation) Level 2 (Red)   Flexion Left;15 reps;Theraband   Theraband Level (Shoulder Flexion) Level 2 (Red)   ABduction Left;15 reps;Theraband   Theraband Level (Shoulder ABduction) Level 2 (Red)   ABduction Limitations to 80 degrees   Extension Both;15 reps;Theraband   Theraband Level (Shoulder Extension) Level 2 (Red)   Extension Limitations 5 sec hold   Row Both;15 reps;Theraband   Theraband Level (Shoulder Row) Level 2 (Red)   Row Limitations 5 sec hold     Shoulder Exercises: Pulleys   Flexion 3 minutes   ABduction 3 minutes   ABduction Limitations scaption     Shoulder Exercises: ROM/Strengthening   UBE (Upper Arm Bike) L2 x 6 min; 3' fwd/3' bwd                  PT Short Term Goals - 08/30/16 0940      PT SHORT TERM GOAL #2   Title improve L shoulder AROM external rotation to at least 70 degrees for improved function (09/19/16)   Time 4   Period Weeks   Status On-going           PT Long Term Goals - 08/30/16 0941      PT LONG TERM GOAL #1   Title Full PROM in order to progress into Phase II rehab (08/22/16)   Baseline external rotation not yet me   Status Partially Met      PT LONG TERM GOAL #2   Title L shoulder AROM: flexion 120; abduction at least 90; and ir/er at least 60 without substitution for progression into phase II (08/22/16)   Baseline all met except er only 56 degrees (4 degrees from goal)   Status Achieved     PT LONG TERM GOAL #3   Title demonstrate 4/5 strength in L shoulder for improved function (10/17/16)   Time 8   Period Weeks   Status On-going     PT LONG TERM GOAL #4   Title report ability to sleep on L side with pain < 3/10 for improved function (10/17/16)   Time 8   Period Weeks   Status On-going  Plan - 09/09/16 0927    Clinical Impression Statement Pt tolerated all strengthening exercises well with reports of soreness after session.  Able to progress home theraband exercises to red today.    PT Treatment/Interventions ADLs/Self Care Home Management;Cryotherapy;Electrical Stimulation;Moist Heat;Ultrasound;Patient/family education;Neuromuscular re-education;Therapeutic exercise;Therapeutic activities;Functional mobility training;Manual techniques;Dry needling;Taping;Passive range of motion;Vasopneumatic Device   PT Next Visit Plan continue strengthening; progress slowly   Consulted and Agree with Plan of Care Patient      Patient will benefit from skilled therapeutic intervention in order to improve the following deficits and impairments:  Postural dysfunction, Decreased strength, Impaired UE functional use, Pain, Decreased range of motion, Decreased scar mobility  Visit Diagnosis: Pain in left shoulder  Stiffness of left shoulder, not elsewhere classified     Problem List Patient Active Problem List   Diagnosis Date Noted  . Dyslipidemia (high LDL; low HDL) 08/15/2016  . Complete rotator cuff tear of left shoulder 07/05/2015  . Tennis elbow 05/30/2014  . Rotator cuff (capsule) sprain 03/10/2014  . Right shoulder pain 02/11/2014  . Memory difficulty 12/18/2012  . Gynecomastia, male 07/30/2012  .  OSA (obstructive sleep apnea) 04/23/2011  . EXTERNAL HEMORRHOIDS 02/12/2011  . Prostate cancer ([place]) 11/12/2010  . PLANTAR FASCIITIS 04/09/2010  . Hypothyroidism 12/08/2009  . NEOPLASM OF UNCERTAIN BEHAVIOR OF SKIN 03/10/2009  . OSTEOARTHRITIS 02/14/2009  . HEARING DEFICIT 02/08/2009  . Essential hypertension 02/08/2009  . ALLERGIC RHINITIS 02/08/2009  . GERD 02/08/2009  . PROTEINURIA 02/08/2009  . DIVERTICULITIS, HX OF 02/08/2009       Laureen Abrahams, PT, DPT 09/09/16 9:28 AM    [facility] [address]  [place], [place], [address] Phone: [phone]   Fax:  [phone]  Name: ALGERNON MUNDIE MRN: [id number] Date of Birth: Jan 13, 1951

## 2016-09-11 MED FILL — PRAVASTATIN NA 40 MG TAB: 40 | 90 days supply | Qty: 45 | Fill #1

## 2016-09-12 ENCOUNTER — Ambulatory Visit: Payer: 59 | Admitting: Physical Therapy

## 2016-09-12 DIAGNOSIS — M25512 Pain in left shoulder: Secondary | ICD-10-CM | POA: Diagnosis not present

## 2016-09-12 DIAGNOSIS — M25612 Stiffness of left shoulder, not elsewhere classified: Secondary | ICD-10-CM | POA: Diagnosis not present

## 2016-09-12 NOTE — Therapy (Signed)
[facility] [address]  [place], [place], [address] Phone: [phone]   Fax:  [phone]  Physical Therapy Treatment  Patient Details  Name: Zachary [name] MRN: [id number] Date of Birth: 1951-01-05 Referring Provider: Dr. Corky Mull  Encounter Date: 09/12/2016      PT End of Session - 09/12/16 1054    Visit Number 22   Number of Visits 32   Date for PT Re-Evaluation 10/17/16   PT Start Time 3825   PT Stop Time 1053   PT Time Calculation (min) 38 min   Activity Tolerance Patient tolerated treatment well   Behavior During Therapy [facility] for tasks assessed/performed      Past Medical History:  Diagnosis Date  . Arthritis   . Articular cartilage disease    left shoulder  . Cancer ([place]) 2015   positive prostate cancer bx  . Diverticulitis   . Dyslipidemia (high LDL; low HDL) 08/15/2016  . GERD (gastroesophageal reflux disease)   . Hearing problem    hearing deficit  . Hemorrhoids   . Hyperlipidemia   . Hypertension   . Hypothyroidism   . Insomnia   . Sleep apnea    uses a cpap  . Wears glasses   . Wears hearing aid    both ears    Past Surgical History:  Procedure Laterality Date  . BACK SURGERY  1978   herniated disksurgery  . CARDIAC CATHETERIZATION  08/01/2010  . COLONOSCOPY    . EXAM UNDER ANESTHESIA WITH MANIPULATION OF SHOULDER Right 09/15/2014   Procedure: RIGHT SHOULDER MANIPULATION UNDER ANESTHESIA;  Surgeon: Ninetta Lights, MD;  Location: [place];  Service: Orthopedics;  Laterality: Right;  . HEMORRHOID SURGERY  08/2006  . SHOULDER ARTHROSCOPY WITH DISTAL CLAVICLE RESECTION Left 07/07/2015   Procedure: SHOULDER ARTHROSCOPY WITH DISTAL CLAVICLE RESECTION;  Surgeon: Kathryne Hitch, MD;  Location: [place];  Service: Orthopedics;  Laterality: Left;  . SHOULDER ARTHROSCOPY WITH ROTATOR CUFF REPAIR Left 12/07/2015   Procedure: LEFT SHOULDER ARTHROSCOPY  DEBRIDEMENT, WITH ROTATOR CUFF REPAIR;  Surgeon: Ninetta Lights, MD;  Location: [place];  Service: Orthopedics;  Laterality: Left;  . SHOULDER ARTHROSCOPY WITH ROTATOR CUFF REPAIR AND SUBACROMIAL DECOMPRESSION Left 07/07/2015   Procedure: LEFT SHOULDER SCOPE DEBRIDEMENT, SUBACROMIAL DECOMPRESSION, DISTAL CLAVICULECTOMY, ROTATOR CUFF REPAIR  ;  Surgeon: Kathryne Hitch, MD;  Location: [place];  Service: Orthopedics;  Laterality: Left;  ANESTHESIA: GENERAL, PRE/POST OP SCALENE  . SHOULDER ARTHROSCOPY WITH SUBACROMIAL DECOMPRESSION, ROTATOR CUFF REPAIR AND BICEP TENDON REPAIR Right 03/10/2014   Procedure: RIGHT SHOULDER ARTHROSCOPY WITH SUBACROMIAL DECOMPRESSION, PARTIAL ACROMIOPLASTY WITH CORACOAROMIAL LABRUM DEBRIDEMENT RELEASE DISTAL CLAVICULECTOMY,  ROTATOR CUFF REPAIR AND EXTENSIVE DEBRIDEMENT;  Surgeon: Ninetta Lights, MD;  Location: [place];  Service: Orthopedics;  Laterality: Right;  . TENDON REPAIR  June 06, 2011   right elbow, Dr. Percell Miller    There were no vitals filed for this visit.      Subjective Assessment - 09/12/16 1014    Subjective just has some nagging discomfort in shoulder   Pertinent History 2 previous RTC repairs   Limitations Lifting;House hold activities   Patient Stated Goals return to work and full recovery   Currently in Pain? No/denies                         [facility] Adult PT Treatment/Exercise - 09/12/16 1017  Shoulder Exercises: Standing   External Rotation Left;15 reps;Theraband   Theraband Level (Shoulder External Rotation) Level 2 (Red)   External Rotation Limitations 2 sets   Internal Rotation Right;15 reps;Theraband   Theraband Level (Shoulder Internal Rotation) Level 2 (Red)   Internal Rotation Weight (lbs) 2 sets   Other Standing Exercises D1/D2 flexion/extension 2x15 with red theraband on L     Shoulder Exercises: ROM/Strengthening   UBE (Upper Arm Bike) L2.5 x 6 min; 3' fwd/3'  bwd   Cybex Row Limitations 25# 2 x 15 both grips   Wall Wash 2# cuff weight flexion 2x15;    Wall Pushups 15 reps   Pushups Limitations with 5 sec hold     Shoulder Exercises: Body Blade   Flexion 30 seconds;1 rep   ABduction 15 seconds   ABduction Limitations scapular plane   External Rotation 30 seconds;1 rep   Internal Rotation 15 seconds;1 rep                  PT Short Term Goals - 08/30/16 0940      PT SHORT TERM GOAL #2   Title improve L shoulder AROM external rotation to at least 70 degrees for improved function (09/19/16)   Time 4   Period Weeks   Status On-going           PT Long Term Goals - 08/30/16 0941      PT LONG TERM GOAL #1   Title Full PROM in order to progress into Phase II rehab (08/22/16)   Baseline external rotation not yet me   Status Partially Met     PT LONG TERM GOAL #2   Title L shoulder AROM: flexion 120; abduction at least 90; and ir/er at least 60 without substitution for progression into phase II (08/22/16)   Baseline all met except er only 56 degrees (4 degrees from goal)   Status Achieved     PT LONG TERM GOAL #3   Title demonstrate 4/5 strength in L shoulder for improved function (10/17/16)   Time 8   Period Weeks   Status On-going     PT LONG TERM GOAL #4   Title report ability to sleep on L side with pain < 3/10 for improved function (10/17/16)   Time 8   Period Weeks   Status On-going               Plan - 09/12/16 1054    Clinical Impression Statement Tolerating strengthening exercises well with fatigue and soreness after session.  Slowly progressing towards goals.   PT Treatment/Interventions ADLs/Self Care Home Management;Cryotherapy;Electrical Stimulation;Moist Heat;Ultrasound;Patient/family education;Neuromuscular re-education;Therapeutic exercise;Therapeutic activities;Functional mobility training;Manual techniques;Dry needling;Taping;Passive range of motion;Vasopneumatic Device   PT Next Visit Plan  continue strengthening; progress slowly; measure er - manual if needed   Consulted and Agree with Plan of Care Patient      Patient will benefit from skilled therapeutic intervention in order to improve the following deficits and impairments:  Postural dysfunction, Decreased strength, Impaired UE functional use, Pain, Decreased range of motion, Decreased scar mobility  Visit Diagnosis: Pain in left shoulder  Stiffness of left shoulder, not elsewhere classified  Left shoulder pain     Problem List Patient Active Problem List   Diagnosis Date Noted  . Dyslipidemia (high LDL; low HDL) 08/15/2016  . Complete rotator cuff tear of left shoulder 07/05/2015  . Tennis elbow 05/30/2014  . Rotator cuff (capsule) sprain 03/10/2014  . Right shoulder pain 02/11/2014  . Memory  difficulty 12/18/2012  . Gynecomastia, male 07/30/2012  . OSA (obstructive sleep apnea) 04/23/2011  . EXTERNAL HEMORRHOIDS 02/12/2011  . Prostate cancer ([place]) 11/12/2010  . PLANTAR FASCIITIS 04/09/2010  . Hypothyroidism 12/08/2009  . NEOPLASM OF UNCERTAIN BEHAVIOR OF SKIN 03/10/2009  . OSTEOARTHRITIS 02/14/2009  . HEARING DEFICIT 02/08/2009  . Essential hypertension 02/08/2009  . ALLERGIC RHINITIS 02/08/2009  . GERD 02/08/2009  . PROTEINURIA 02/08/2009  . DIVERTICULITIS, HX OF 02/08/2009       Laureen Abrahams, PT, DPT 09/12/16 10:55 AM    [facility] [address]  [address] [place], [place], [address] Phone: [phone]   Fax:  [phone]  Name: Zachary [name] MRN: [id number] Date of Birth: 04-20-1951

## 2016-09-16 ENCOUNTER — Ambulatory Visit: Payer: 59

## 2016-09-16 ENCOUNTER — Encounter: Payer: Self-pay | Admitting: Internal Medicine

## 2016-09-16 DIAGNOSIS — M25512 Pain in left shoulder: Secondary | ICD-10-CM

## 2016-09-16 DIAGNOSIS — M25612 Stiffness of left shoulder, not elsewhere classified: Secondary | ICD-10-CM | POA: Diagnosis not present

## 2016-09-16 NOTE — Therapy (Signed)
[facility] [address]  [place], [place], [address] Phone: [phone]   Fax:  [phone]  Physical Therapy Treatment  Patient Details  Name: Zachary [name] MRN: [id number] Date of Birth: [birth year] Referring Provider: Dr. Corky Mull   Encounter Date: 09/16/2016      PT End of Session - 09/16/16 0951    Visit Number 23   Number of Visits 32   Date for PT Re-Evaluation 10/17/16   PT Start Time 0932   PT Stop Time 1015   PT Time Calculation (min) 43 min   Activity Tolerance Patient tolerated treatment well   Behavior During Therapy [facility] for tasks assessed/performed      Past Medical History:  Diagnosis Date  . Arthritis   . Articular cartilage disease    left shoulder  . Cancer ([place]) 2015   positive prostate cancer bx  . Diverticulitis   . Dyslipidemia (high LDL; low HDL) 08/15/2016  . GERD (gastroesophageal reflux disease)   . Hearing problem    hearing deficit  . Hemorrhoids   . Hyperlipidemia   . Hypertension   . Hypothyroidism   . Insomnia   . Sleep apnea    uses a cpap  . Wears glasses   . Wears hearing aid    both ears    Past Surgical History:  Procedure Laterality Date  . BACK SURGERY  1978   herniated disksurgery  . CARDIAC CATHETERIZATION  08/01/2010  . COLONOSCOPY    . EXAM UNDER ANESTHESIA WITH MANIPULATION OF SHOULDER Right 09/15/2014   Procedure: RIGHT SHOULDER MANIPULATION UNDER ANESTHESIA;  Surgeon: Ninetta Lights, MD;  Location: [place];  Service: Orthopedics;  Laterality: Right;  . HEMORRHOID SURGERY  08/2006  . SHOULDER ARTHROSCOPY WITH DISTAL CLAVICLE RESECTION Left 07/07/2015   Procedure: SHOULDER ARTHROSCOPY WITH DISTAL CLAVICLE RESECTION;  Surgeon: Kathryne Hitch, MD;  Location: [place];  Service: Orthopedics;  Laterality: Left;  . SHOULDER ARTHROSCOPY WITH ROTATOR CUFF REPAIR Left 12/07/2015   Procedure: LEFT SHOULDER ARTHROSCOPY  DEBRIDEMENT, WITH ROTATOR CUFF REPAIR;  Surgeon: Ninetta Lights, MD;  Location: [place];  Service: Orthopedics;  Laterality: Left;  . SHOULDER ARTHROSCOPY WITH ROTATOR CUFF REPAIR AND SUBACROMIAL DECOMPRESSION Left 07/07/2015   Procedure: LEFT SHOULDER SCOPE DEBRIDEMENT, SUBACROMIAL DECOMPRESSION, DISTAL CLAVICULECTOMY, ROTATOR CUFF REPAIR  ;  Surgeon: Kathryne Hitch, MD;  Location: [place];  Service: Orthopedics;  Laterality: Left;  ANESTHESIA: GENERAL, PRE/POST OP SCALENE  . SHOULDER ARTHROSCOPY WITH SUBACROMIAL DECOMPRESSION, ROTATOR CUFF REPAIR AND BICEP TENDON REPAIR Right 03/10/2014   Procedure: RIGHT SHOULDER ARTHROSCOPY WITH SUBACROMIAL DECOMPRESSION, PARTIAL ACROMIOPLASTY WITH CORACOAROMIAL LABRUM DEBRIDEMENT RELEASE DISTAL CLAVICULECTOMY,  ROTATOR CUFF REPAIR AND EXTENSIVE DEBRIDEMENT;  Surgeon: Ninetta Lights, MD;  Location: [place];  Service: Orthopedics;  Laterality: Right;  . TENDON REPAIR  June 06, 2011   right elbow, Dr. Percell Miller    There were no vitals filed for this visit.      Subjective Assessment - 09/16/16 0936    Subjective Pt. with nagging L shoulder pain initially today and reports this pain has been present over the weekend however is currently able to sleep on the L side at night.      Patient Stated Goals return to work and full recovery   Currently in Pain? Yes   Pain Score 3    Pain Location Shoulder   Pain Orientation Left   Pain Descriptors /  Indicators Aching;Tender   Pain Type Surgical pain   Pain Onset More than a month ago   Pain Frequency Intermittent   Multiple Pain Sites No            [facility] PT Assessment - 09/16/16 0954      Assessment   Referring Provider Dr. Corky Mull    Next MD Visit 10/04/16     AROM   Left Shoulder External Rotation 73 Degrees  prone        Today's treatment:  Therex: UBE: 2.5 level, 3 min each way  Manual: L shoulder PROM all directions  L shoulder mobs    Therex: L shoulder ER, IR with red TB 2 x 15 reps  L shoulder D1/D2 flexion, extension with red TB 2 x 15 reps BATCA low row 35# 2 x 10 reps  Wall pushup 2 x 10 reps  Body blade L shoulder ER, IR, abduction (20 sec), scaption (20 sec) flexion x 30 sec each         PT Short Term Goals - 09/16/16 0957      PT SHORT TERM GOAL #2   Title improve L shoulder AROM external rotation to at least 70 degrees for improved function (09/19/16)   Time 4   Period Weeks   Status Achieved           PT Long Term Goals - 09/16/16 1244      PT LONG TERM GOAL #1   Title Full PROM in order to progress into Phase II rehab (08/22/16)   Baseline external rotation not yet me   Status Partially Met     PT LONG TERM GOAL #2   Title L shoulder AROM: flexion 120; abduction at least 90; and ir/er at least 60 without substitution for progression into phase II (08/22/16)   Baseline --   Status Achieved     PT LONG TERM GOAL #3   Title demonstrate 4/5 strength in L shoulder for improved function (10/17/16)   Time 8   Period Weeks   Status On-going     PT LONG TERM GOAL #4   Title report ability to sleep on L side with pain < 3/10 for improved function (10/17/16)   Time 8   Period Weeks   Status Partially Met  Pt. reporting able to sleep on L side without pain, "some nights".                 Plan - 09/16/16 0951    Clinical Impression Statement Pt. reports L shoulder with nagging pain today however that he is now able to sleep on L side in bed some nights without pain.  Pt. tolerated increased repetitions with L shoulder strengthening activity well today with pain remaining <3/10 throughout therex.  Pt. able to demo L shoulder AROM of 73 dg today and seems to be progressing well at this point per protocol.     PT Treatment/Interventions ADLs/Self Care Home Management;Cryotherapy;Electrical Stimulation;Moist Heat;Ultrasound;Patient/family education;Neuromuscular re-education;Therapeutic  exercise;Therapeutic activities;Functional mobility training;Manual techniques;Dry needling;Taping;Passive range of motion;Vasopneumatic Device   PT Next Visit Plan continue strengthening; progress slowly      Patient will benefit from skilled therapeutic intervention in order to improve the following deficits and impairments:  Postural dysfunction, Decreased strength, Impaired UE functional use, Pain, Decreased range of motion, Decreased scar mobility  Visit Diagnosis: Pain in left shoulder  Stiffness of left shoulder, not elsewhere classified     Problem List Patient Active Problem List  Diagnosis Date Noted  . Dyslipidemia (high LDL; low HDL) 08/15/2016  . Complete rotator cuff tear of left shoulder 07/05/2015  . Tennis elbow 05/30/2014  . Rotator cuff (capsule) sprain 03/10/2014  . Right shoulder pain 02/11/2014  . Memory difficulty 12/18/2012  . Gynecomastia, male 07/30/2012  . OSA (obstructive sleep apnea) 04/23/2011  . EXTERNAL HEMORRHOIDS 02/12/2011  . Prostate cancer ([place]) 11/12/2010  . PLANTAR FASCIITIS 04/09/2010  . Hypothyroidism 12/08/2009  . NEOPLASM OF UNCERTAIN BEHAVIOR OF SKIN 03/10/2009  . OSTEOARTHRITIS 02/14/2009  . HEARING DEFICIT 02/08/2009  . Essential hypertension 02/08/2009  . ALLERGIC RHINITIS 02/08/2009  . GERD 02/08/2009  . PROTEINURIA 02/08/2009  . DIVERTICULITIS, HX OF 02/08/2009    Bess Harvest, PTA 09/16/2016, 12:46 PM  [facility] [address]  [place], [place], [address] Phone: [phone]   Fax:  [phone]  Name: Zachary [name] MRN: [id number] Date of Birth: Aug 22, 1951

## 2016-09-18 DIAGNOSIS — C61 Malignant neoplasm of prostate: Secondary | ICD-10-CM | POA: Diagnosis not present

## 2016-09-19 ENCOUNTER — Ambulatory Visit: Payer: 59

## 2016-09-19 DIAGNOSIS — M25512 Pain in left shoulder: Secondary | ICD-10-CM

## 2016-09-19 DIAGNOSIS — M25612 Stiffness of left shoulder, not elsewhere classified: Secondary | ICD-10-CM | POA: Diagnosis not present

## 2016-09-19 NOTE — Therapy (Signed)
[facility] [address]  [place], [place], [address] Phone: [phone]   Fax:  [phone]  Physical Therapy Treatment  Patient Details  Name: Zachary [name] MRN: [id number] Date of Birth: [birth year] Referring Provider: Dr. Corky Mull   Encounter Date: 09/19/2016      PT End of Session - 09/19/16 0935    Visit Number 24   Number of Visits 32   Date for PT Re-Evaluation 10/17/16   PT Start Time 0932   PT Stop Time 1015   PT Time Calculation (min) 43 min   Activity Tolerance Patient tolerated treatment well   Behavior During Therapy [facility] for tasks assessed/performed      Past Medical History:  Diagnosis Date  . Arthritis   . Articular cartilage disease    left shoulder  . Cancer ([place]) 2015   positive prostate cancer bx  . Diverticulitis   . Dyslipidemia (high LDL; low HDL) 08/15/2016  . GERD (gastroesophageal reflux disease)   . Hearing problem    hearing deficit  . Hemorrhoids   . Hyperlipidemia   . Hypertension   . Hypothyroidism   . Insomnia   . Sleep apnea    uses a cpap  . Wears glasses   . Wears hearing aid    both ears    Past Surgical History:  Procedure Laterality Date  . BACK SURGERY  1978   herniated disksurgery  . CARDIAC CATHETERIZATION  08/01/2010  . COLONOSCOPY    . EXAM UNDER ANESTHESIA WITH MANIPULATION OF SHOULDER Right 09/15/2014   Procedure: RIGHT SHOULDER MANIPULATION UNDER ANESTHESIA;  Surgeon: Ninetta Lights, MD;  Location: [place];  Service: Orthopedics;  Laterality: Right;  . HEMORRHOID SURGERY  08/2006  . SHOULDER ARTHROSCOPY WITH DISTAL CLAVICLE RESECTION Left 07/07/2015   Procedure: SHOULDER ARTHROSCOPY WITH DISTAL CLAVICLE RESECTION;  Surgeon: Kathryne Hitch, MD;  Location: [place];  Service: Orthopedics;  Laterality: Left;  . SHOULDER ARTHROSCOPY WITH ROTATOR CUFF REPAIR Left 12/07/2015   Procedure: LEFT SHOULDER ARTHROSCOPY  DEBRIDEMENT, WITH ROTATOR CUFF REPAIR;  Surgeon: Ninetta Lights, MD;  Location: [place];  Service: Orthopedics;  Laterality: Left;  . SHOULDER ARTHROSCOPY WITH ROTATOR CUFF REPAIR AND SUBACROMIAL DECOMPRESSION Left 07/07/2015   Procedure: LEFT SHOULDER SCOPE DEBRIDEMENT, SUBACROMIAL DECOMPRESSION, DISTAL CLAVICULECTOMY, ROTATOR CUFF REPAIR  ;  Surgeon: Kathryne Hitch, MD;  Location: [place];  Service: Orthopedics;  Laterality: Left;  ANESTHESIA: GENERAL, PRE/POST OP SCALENE  . SHOULDER ARTHROSCOPY WITH SUBACROMIAL DECOMPRESSION, ROTATOR CUFF REPAIR AND BICEP TENDON REPAIR Right 03/10/2014   Procedure: RIGHT SHOULDER ARTHROSCOPY WITH SUBACROMIAL DECOMPRESSION, PARTIAL ACROMIOPLASTY WITH CORACOAROMIAL LABRUM DEBRIDEMENT RELEASE DISTAL CLAVICULECTOMY,  ROTATOR CUFF REPAIR AND EXTENSIVE DEBRIDEMENT;  Surgeon: Ninetta Lights, MD;  Location: [place];  Service: Orthopedics;  Laterality: Right;  . TENDON REPAIR  June 06, 2011   right elbow, Dr. Percell Miller    There were no vitals filed for this visit.      Subjective Assessment - 09/19/16 0934    Subjective Pt. reporting nagging L shoulder pain is gone now with only mild pain initially today.     Patient Stated Goals return to work and full recovery   Currently in Pain? Yes   Pain Score 1    Pain Location Shoulder   Pain Orientation Left   Multiple Pain Sites No        Today's treatment:  Therex: L shoulder pulleys  flexion UBE: 2.5 level, 3 min each way  Manual: L shoulder PROM all directions  L shoulder caudal, posterior grade III mobs   Therex: L shoulder ER with red TB x 15 reps L shoulder IR with green TB x 15 reps   L shoulder D2 extension with red TB x 15 reps L shoulder D1 flexion with red TB x 15 reps  BATCA low row 35# x 15 reps  BATCA pull down 15# x 15 reps; narrow grip  Standing scapular retraction/ext. With blue TB (to neutral) x 15 reps Wall pushup 2 x 15 reps  Body  blade L shoulder ER, IR, abduction, scaption, flexion x 30 sec each BATCA low row 45# x 10 reps          PT Short Term Goals - 09/16/16 0957      PT SHORT TERM GOAL #2   Title improve L shoulder AROM external rotation to at least 70 degrees for improved function (09/19/16)   Time 4   Period Weeks   Status Achieved           PT Long Term Goals - 09/16/16 1244      PT LONG TERM GOAL #1   Title Full PROM in order to progress into Phase II rehab (08/22/16)   Baseline external rotation not yet me   Status Partially Met     PT LONG TERM GOAL #2   Title L shoulder AROM: flexion 120; abduction at least 90; and ir/er at least 60 without substitution for progression into phase II (08/22/16)   Baseline --   Status Achieved     PT LONG TERM GOAL #3   Title demonstrate 4/5 strength in L shoulder for improved function (10/17/16)   Time 8   Period Weeks   Status On-going     PT LONG TERM GOAL #4   Title report ability to sleep on L side with pain < 3/10 for improved function (10/17/16)   Time 8   Period Weeks   Status Partially Met  Pt. reporting able to sleep on L side without pain, "some nights".                 Plan - 09/19/16 6761    Clinical Impression Statement Pt. with only mild L shoulder pain today which was well managed throughout therex.  Pt. able to increase resistance with L shoulder strengthening activities today and duration with body blade stability activities.  Pt. seems to be progressing well and reported he feels he is able to do more at this point.     PT Treatment/Interventions ADLs/Self Care Home Management;Cryotherapy;Electrical Stimulation;Moist Heat;Ultrasound;Patient/family education;Neuromuscular re-education;Therapeutic exercise;Therapeutic activities;Functional mobility training;Manual techniques;Dry needling;Taping;Passive range of motion;Vasopneumatic Device   PT Next Visit Plan update HEP; continue strengthening; progress slowly       Patient will benefit from skilled therapeutic intervention in order to improve the following deficits and impairments:  Postural dysfunction, Decreased strength, Impaired UE functional use, Pain, Decreased range of motion, Decreased scar mobility  Visit Diagnosis: Pain in left shoulder  Stiffness of left shoulder, not elsewhere classified  Left shoulder pain     Problem List Patient Active Problem List   Diagnosis Date Noted  . Dyslipidemia (high LDL; low HDL) 08/15/2016  . Complete rotator cuff tear of left shoulder 07/05/2015  . Tennis elbow 05/30/2014  . Rotator cuff (capsule) sprain 03/10/2014  . Right shoulder pain 02/11/2014  . Memory difficulty 12/18/2012  . Gynecomastia, male 07/30/2012  . OSA (  obstructive sleep apnea) 04/23/2011  . EXTERNAL HEMORRHOIDS 02/12/2011  . Prostate cancer ([place]) 11/12/2010  . PLANTAR FASCIITIS 04/09/2010  . Hypothyroidism 12/08/2009  . NEOPLASM OF UNCERTAIN BEHAVIOR OF SKIN 03/10/2009  . OSTEOARTHRITIS 02/14/2009  . HEARING DEFICIT 02/08/2009  . Essential hypertension 02/08/2009  . ALLERGIC RHINITIS 02/08/2009  . GERD 02/08/2009  . PROTEINURIA 02/08/2009  . DIVERTICULITIS, HX OF 02/08/2009    Bess Harvest, PTA 09/19/2016, 12:59 PM  [facility] [address]  [place], [place], [address] Phone: [phone]   Fax:  [phone]  Name: Zachary [name] MRN: [id number] Date of Birth: 01-02-1951

## 2016-09-23 ENCOUNTER — Ambulatory Visit: Payer: 59 | Attending: Sports Medicine | Admitting: Physical Therapy

## 2016-09-23 ENCOUNTER — Ambulatory Visit (INDEPENDENT_AMBULATORY_CARE_PROVIDER_SITE_OTHER): Payer: 59 | Admitting: Family Medicine

## 2016-09-23 ENCOUNTER — Encounter: Payer: Self-pay | Admitting: Family Medicine

## 2016-09-23 VITALS — BP 112/78 | HR 90 | Temp 98.1°F | Ht 68.0 in | Wt 197.6 lb

## 2016-09-23 DIAGNOSIS — M25612 Stiffness of left shoulder, not elsewhere classified: Secondary | ICD-10-CM | POA: Diagnosis not present

## 2016-09-23 DIAGNOSIS — M545 Low back pain, unspecified: Secondary | ICD-10-CM

## 2016-09-23 DIAGNOSIS — M25512 Pain in left shoulder: Secondary | ICD-10-CM | POA: Insufficient documentation

## 2016-09-23 MED ORDER — NAPROXEN 500 MG PO TABS: 500.0000 mg | ORAL_TABLET | Freq: Two times a day (BID) | ORAL | 0 refills | Status: DC

## 2016-09-23 MED ORDER — KETOROLAC TROMETHAMINE 60 MG/2ML IM SOLN
60.0000 mg | Freq: Once | INTRAMUSCULAR | Status: AC
  Administered 2016-09-23: 60 mg via INTRAMUSCULAR

## 2016-09-23 MED ORDER — CYCLOBENZAPRINE HCL 5 MG PO TABS: 5.0000 mg | ORAL_TABLET | Freq: Three times a day (TID) | ORAL | 0 refills | Status: DC | PRN

## 2016-09-23 MED FILL — NAPROXEN 500 MG TABLET: 500 | 10 days supply | Qty: 20 | Fill #0

## 2016-09-23 MED FILL — CYCLOBENZAPRINE 5 MG TABLET: 5 | 7 days supply | Qty: 20 | Fill #0

## 2016-09-23 NOTE — Patient Instructions (Signed)
EXERCISES  RANGE OF MOTION (ROM) AND STRETCHING EXERCISES - Low Back Sprain Most people with lower back pain will find that their symptoms get worse with excessive bending forward (flexion) or arching at the lower back (extension). The exercises that will help resolve your symptoms will focus on the opposite motion.  Your physician, physical therapist or athletic trainer will help you determine which exercises will be most helpful to resolve your lower back pain. Do not complete any exercises without first consulting with your caregiver. Discontinue any exercises which make your symptoms worse, until you speak to your caregiver. If you have pain, numbness or tingling which travels down into your buttocks, leg or foot, the goal of the therapy is for these symptoms to move closer to your back and eventually resolve. Sometimes, these leg symptoms will get better, but your lower back pain may worsen. This is often an indication of progress in your rehabilitation. Be very alert to any changes in your symptoms and the activities in which you participated in the 24 hours prior to the change. Sharing this information with your caregiver will allow him or her to most efficiently treat your condition. These exercises may help you when beginning to rehabilitate your injury. Your symptoms may resolve with or without further involvement from your physician, physical therapist or athletic trainer. While completing these exercises, remember:   Restoring tissue flexibility helps normal motion to return to the joints. This allows healthier, less painful movement and activity.  An effective stretch should be held for at least 30 seconds.  A stretch should never be painful. You should only feel a gentle lengthening or release in the stretched tissue. FLEXION RANGE OF MOTION AND STRETCHING EXERCISES:  STRETCH - Flexion, Single Knee to Chest   Lie on a firm bed or floor with both legs extended in front of you.  Keeping  one leg in contact with the floor, bring your opposite knee to your chest. Hold your leg in place by either grabbing behind your thigh or at your knee.  Pull until you feel a gentle stretch in your low back. Hold 15-20 seconds.  Slowly release your grasp and repeat the exercise with the opposite side. Repeat 2 times. Complete this exercise 1-2 times per day.   STRETCH - Flexion, Double Knee to Chest  Lie on a firm bed or floor with both legs extended in front of you.  Keeping one leg in contact with the floor, bring your opposite knee to your chest.  Tense your stomach muscles to support your back and then lift your other knee to your chest. Hold your legs in place by either grabbing behind your thighs or at your knees.  Pull both knees toward your chest until you feel a gentle stretch in your low back. Hold 15-20 seconds.  Tense your stomach muscles and slowly return one leg at a time to the floor. Repeat 2 times. Complete this exercise 1-2 times per day.   STRETCH - Low Trunk Rotation  Lie on a firm bed or floor. Keeping your legs in front of you, bend your knees so they are both pointed toward the ceiling and your feet are flat on the floor.  Extend your arms out to the side. This will stabilize your upper body by keeping your shoulders in contact with the floor.  Gently and slowly drop both knees together to one side until you feel a gentle stretch in your low back. Hold for 15-20 seconds.  Tense your  stomach muscles to support your lower back as you bring your knees back to the starting position. Repeat the exercise to the other side. Repeat 2 times. Complete this exercise 1-2 times per day  EXTENSION RANGE OF MOTION AND FLEXIBILITY EXERCISES:  STRETCH - Extension, Prone on Elbows   Lie on your stomach on the floor, a bed will be too soft. Place your palms about shoulder width apart and at the height of your head.  Place your elbows under your shoulders. If this is too  painful, stack pillows under your chest.  Allow your body to relax so that your hips drop lower and make contact more completely with the floor.  Hold this position for 15-20 seconds.  Slowly return to lying flat on the floor. Repeat 2 times. Complete this exercise 1-2 times per day.   RANGE OF MOTION - Extension, Prone Press Ups  Lie on your stomach on the floor, a bed will be too soft. Place your palms about shoulder width apart and at the height of your head.  Keeping your back as relaxed as possible, slowly straighten your elbows while keeping your hips on the floor. You may adjust the placement of your hands to maximize your comfort. As you gain motion, your hands will come more underneath your shoulders.  Hold this position 15-20 seconds.  Slowly return to lying flat on the floor. Repeat 2 times. Complete this exercise 1-2 times per day.   RANGE OF MOTION- Quadruped, Neutral Spine   Assume a hands and knees position on a firm surface. Keep your hands under your shoulders and your knees under your hips. You may place padding under your knees for comfort.  Drop your head and point your tailbone toward the ground below you. This will round out your lower back like an angry cat. Hold this position for 15-20 seconds.  Slowly lift your head and release your tail bone so that your back sags into a large arch, like an old horse.  Hold this position for 15-20 seconds.  Repeat this until you feel limber in your low back.  Now, find your "sweet spot." This will be the most comfortable position somewhere between the two previous positions. This is your neutral spine. Once you have found this position, tense your stomach muscles to support your low back.  Hold this position for 15-20 seconds. Repeat 2 times. Complete this exercise 1-2 times per day.  STRENGTHENING EXERCISES - Low Back Sprain These exercises may help you when beginning to rehabilitate your injury. These exercises should  be done near your "sweet spot." This is the neutral, low-back arch, somewhere between fully rounded and fully arched, that is your least painful position. When performed in this safe range of motion, these exercises can be used for people who have either a flexion or extension based injury. These exercises may resolve your symptoms with or without further involvement from your physician, physical therapist or athletic trainer. While completing these exercises, remember:   Muscles can gain both the endurance and the strength needed for everyday activities through controlled exercises.  Complete these exercises as instructed by your physician, physical therapist or athletic trainer. Increase the resistance and repetitions only as guided.  You may experience muscle soreness or fatigue, but the pain or discomfort you are trying to eliminate should never worsen during these exercises. If this pain does worsen, stop and make certain you are following the directions exactly. If the pain is still present after adjustments, discontinue the  exercise until you can discuss the trouble with your caregiver.  STRENGTHENING - Deep Abdominals, Pelvic Tilt   Lie on a firm bed or floor. Keeping your legs in front of you, bend your knees so they are both pointed toward the ceiling and your feet are flat on the floor.  Tense your lower abdominal muscles to press your low back into the floor. This motion will rotate your pelvis so that your tail bone is scooping upwards rather than pointing at your feet or into the floor. With a gentle tension and even breathing, hold this position for 10-15 seconds. Repeat 2 times. Complete this exercise 1 time per day.   STRENGTHENING - Abdominals, Crunches   Lie on a firm bed or floor. Keeping your legs in front of you, bend your knees so they are both pointed toward the ceiling and your feet are flat on the floor. Cross your arms over your chest.  Slightly tip your chin down  without bending your neck.  Tense your abdominals and slowly lift your trunk high enough to just clear your shoulder blades. Lifting higher can put excessive stress on the lower back and does not further strengthen your abdominal muscles.  Control your return to the starting position. Repeat 2 times. Complete this exercise once every 1-2 days.   STRENGTHENING - Quadruped, Opposite UE/LE Lift   Assume a hands and knees position on a firm surface. Keep your hands under your shoulders and your knees under your hips. You may place padding under your knees for comfort.  Find your neutral spine and gently tense your abdominal muscles so that you can maintain this position. Your shoulders and hips should form a rectangle that is parallel with the floor and is not twisted.  Keeping your trunk steady, lift your right hand no higher than your shoulder and then your left leg no higher than your hip. Make sure you are not holding your breath. Hold this position for 15-20 seconds.  Continuing to keep your abdominal muscles tense and your back steady, slowly return to your starting position. Repeat with the opposite arm and leg. Repeat 2 times. Complete this exercise once every 1-2 days.   STRENGTHENING - Abdominals and Quadriceps, Straight Leg Raise   Lie on a firm bed or floor with both legs extended in front of you.  Keeping one leg in contact with the floor, bend the other knee so that your foot can rest flat on the floor.  Find your neutral spine, and tense your abdominal muscles to maintain your spinal position throughout the exercise.  Slowly lift your straight leg off the floor about 6 inches for a count of 15, making sure to not hold your breath.  Still keeping your neutral spine, slowly lower your leg all the way to the floor. Repeat this exercise with each leg 2 times. Complete this exercise once every 1-2 days. POSTURE AND BODY MECHANICS CONSIDERATIONS - Low Back Sprain Keeping correct  posture when sitting, standing or completing your activities will reduce the stress put on different body tissues, allowing injured tissues a chance to heal and limiting painful experiences. The following are general guidelines for improved posture. Your physician or physical therapist will provide you with any instructions specific to your needs. While reading these guidelines, remember:  The exercises prescribed by your provider will help you have the flexibility and strength to maintain correct postures.  The correct posture provides the best environment for your joints to work. All of your  joints have less wear and tear when properly supported by a spine with good posture. This means you will experience a healthier, less painful body.  Correct posture must be practiced with all of your activities, especially prolonged sitting and standing. Correct posture is as important when doing repetitive low-stress activities (typing) as it is when doing a single heavy-load activity (lifting).  RESTING POSITIONS Consider which positions are most painful for you when choosing a resting position. If you have pain with flexion-based activities (sitting, bending, stooping, squatting), choose a position that allows you to rest in a less flexed posture. You would want to avoid curling into a fetal position on your side. If your pain worsens with extension-based activities (prolonged standing, working overhead), avoid resting in an extended position such as sleeping on your stomach. Most people will find more comfort when they rest with their spine in a more neutral position, neither too rounded nor too arched. Lying on a non-sagging bed on your side with a pillow between your knees, or on your back with a pillow under your knees will often provide some relief. Keep in mind, being in any one position for a prolonged period of time, no matter how correct your posture, can still lead to stiffness. PROPER SITTING  POSTURE In order to minimize stress and discomfort on your spine, you must sit with correct posture. Sitting with good posture should be effortless for a healthy body. Returning to good posture is a gradual process. Many people can work toward this most comfortably by using various supports until they have the flexibility and strength to maintain this posture on their own. When sitting with proper posture, your ears will fall over your shoulders and your shoulders will fall over your hips. You should use the back of the chair to support your upper back. Your lower back will be in a neutral position, just slightly arched. You may place a small pillow or folded towel at the base of your lower back for  support.  When working at a desk, create an environment that supports good, upright posture. Without extra support, muscles tire, which leads to excessive strain on joints and other tissues. Keep these recommendations in mind:  CHAIR:  A chair should be able to slide under your desk when your back makes contact with the back of the chair. This allows you to work closely.  The chair's height should allow your eyes to be level with the upper part of your monitor and your hands to be slightly lower than your elbows.  BODY POSITION  Your feet should make contact with the floor. If this is not possible, use a foot rest.  Keep your ears over your shoulders. This will reduce stress on your neck and low back.  INCORRECT SITTING POSTURES  If you are feeling tired and unable to assume a healthy sitting posture, do not slouch or slump. This puts excessive strain on your back tissues, causing more damage and pain. Healthier options include:  Using more support, like a lumbar pillow.  Switching tasks to something that requires you to be upright or walking.  Talking a brief walk.  Lying down to rest in a neutral-spine position.  PROLONGED STANDING WHILE SLIGHTLY LEANING FORWARD  When completing a task  that requires you to lean forward while standing in one place for a long time, place either foot up on a stationary 2-4 inch high object to help maintain the best posture. When both feet are on the ground,  the lower back tends to lose its slight inward curve. If this curve flattens (or becomes too large), then the back and your other joints will experience too much stress, tire more quickly, and can cause pain.  CORRECT STANDING POSTURES Proper standing posture should be assumed with all daily activities, even if they only take a few moments, like when brushing your teeth. As in sitting, your ears should fall over your shoulders and your shoulders should fall over your hips. You should keep a slight tension in your abdominal muscles to brace your spine. Your tailbone should point down to the ground, not behind your body, resulting in an over-extended swayback posture.   INCORRECT STANDING POSTURES  Common incorrect standing postures include a forward head, locked knees and/or an excessive swayback. WALKING Walk with an upright posture. Your ears, shoulders and hips should all line-up.  PROLONGED ACTIVITY IN A FLEXED POSITION When completing a task that requires you to bend forward at your waist or lean over a low surface, try to find a way to stabilize 3 out of 4 of your limbs. You can place a hand or elbow on your thigh or rest a knee on the surface you are reaching across. This will provide you more stability, so that your muscles do not tire as quickly. By keeping your knees relaxed, or slightly bent, you will also reduce stress across your lower back. CORRECT LIFTING TECHNIQUES  DO :  Assume a wide stance. This will provide you more stability and the opportunity to get as close as possible to the object which you are lifting.  Tense your abdominals to brace your spine. Bend at the knees and hips. Keeping your back locked in a neutral-spine position, lift using your leg muscles. Lift with your  legs, keeping your back straight.  Test the weight of unknown objects before attempting to lift them.  Try to keep your elbows locked down at your sides in order get the best strength from your shoulders when carrying an object.     Always ask for help when lifting heavy or awkward objects. INCORRECT LIFTING TECHNIQUES DO NOT:   Lock your knees when lifting, even if it is a small object.  Bend and twist. Pivot at your feet or move your feet when needing to change directions.  Assume that you can safely pick up even a paperclip without proper posture.

## 2016-09-23 NOTE — Addendum Note (Signed)
Addended by: Pomeroy Cellar on: 09/23/2016 02:12 PM   Modules accepted: Orders

## 2016-09-23 NOTE — Progress Notes (Signed)
Pre visit review using our clinic review tool, if applicable. No additional management support is needed unless otherwise documented below in the visit note. 

## 2016-09-23 NOTE — Progress Notes (Signed)
Musculoskeletal Exam  Patient: Zachary [name] DOB: 03-Sep-1951  DOS: 09/23/2016  SUBJECTIVE:  Chief Complaint:   Chief Complaint  Patient presents with  . Back Pain    Pt reports low back pain radiating into the left side not radiating into the legs or feet. Pt also reports being scheduled for colonoscopy on 11/06/16 to be done    Zachary [name] is a 65 y.o.  male for evaluation and treatment of his back pain.   Onset:  1 week ago.  sudden.  Location: ower left Character:  aching  Progression of issue:  is unchanged Associated symptoms: inability to bend over Denies bowel/bladder incontinence or weakness Treatment: to date has been heating pad, Tylenol Neurovascular symptoms: no  ROS: Gastrointestinal: no bowel incontinence Genitourinary: No bladder incontinence, no blood in urine Musculoskeletal/Extremities: +back pain Neurologic: no numbness, tingling no weakness   Past Medical History:  Diagnosis Date  . Arthritis   . Articular cartilage disease    left shoulder  . Cancer ([place]) 2015   positive prostate cancer bx  . Diverticulitis   . Dyslipidemia (high LDL; low HDL) 08/15/2016  . GERD (gastroesophageal reflux disease)   . Hearing problem    hearing deficit  . Hemorrhoids   . Hypertension   . Hypothyroidism   . Sleep apnea    uses a cpap  . Wears glasses   . Wears hearing aid    both ears   Past Surgical History:  Procedure Laterality Date  . BACK SURGERY  1978   herniated disksurgery  . CARDIAC CATHETERIZATION  08/01/2010  . COLONOSCOPY    . EXAM UNDER ANESTHESIA WITH MANIPULATION OF SHOULDER Right 09/15/2014   Procedure: RIGHT SHOULDER MANIPULATION UNDER ANESTHESIA;  Surgeon: Ninetta Lights, MD;  Location: [place];  Service: Orthopedics;  Laterality: Right;  . HEMORRHOID SURGERY  08/2006  . SHOULDER ARTHROSCOPY WITH DISTAL CLAVICLE RESECTION Left 07/07/2015   Procedure: SHOULDER ARTHROSCOPY WITH DISTAL CLAVICLE RESECTION;   Surgeon: Kathryne Hitch, MD;  Location: [place];  Service: Orthopedics;  Laterality: Left;  . SHOULDER ARTHROSCOPY WITH ROTATOR CUFF REPAIR Left 12/07/2015   Procedure: LEFT SHOULDER ARTHROSCOPY DEBRIDEMENT, WITH ROTATOR CUFF REPAIR;  Surgeon: Ninetta Lights, MD;  Location: [place];  Service: Orthopedics;  Laterality: Left;  . SHOULDER ARTHROSCOPY WITH ROTATOR CUFF REPAIR AND SUBACROMIAL DECOMPRESSION Left 07/07/2015   Procedure: LEFT SHOULDER SCOPE DEBRIDEMENT, SUBACROMIAL DECOMPRESSION, DISTAL CLAVICULECTOMY, ROTATOR CUFF REPAIR  ;  Surgeon: Kathryne Hitch, MD;  Location: [place];  Service: Orthopedics;  Laterality: Left;  ANESTHESIA: GENERAL, PRE/POST OP SCALENE  . SHOULDER ARTHROSCOPY WITH SUBACROMIAL DECOMPRESSION, ROTATOR CUFF REPAIR AND BICEP TENDON REPAIR Right 03/10/2014   Procedure: RIGHT SHOULDER ARTHROSCOPY WITH SUBACROMIAL DECOMPRESSION, PARTIAL ACROMIOPLASTY WITH CORACOAROMIAL LABRUM DEBRIDEMENT RELEASE DISTAL CLAVICULECTOMY,  ROTATOR CUFF REPAIR AND EXTENSIVE DEBRIDEMENT;  Surgeon: Ninetta Lights, MD;  Location: [place];  Service: Orthopedics;  Laterality: Right;  . TENDON REPAIR  June 06, 2011   right elbow, Dr. Percell Miller   Family History  Problem Relation Age of Onset  . Heart disease Father   . Breast cancer Mother   . Cancer Brother   . Alcohol abuse Other   . Arthritis Other   . Cancer Other     Breast, Prostate  . Coronary artery disease Other   . Irritable bowel syndrome Other   . Cystic fibrosis Other    Current Outpatient Prescriptions  Medication Sig Dispense Refill  .  acetaminophen (TYLENOL) 500 MG tablet Take 500 mg by mouth every 6 (six) hours as needed.    Marland Kitchen aspirin EC 81 MG EC tablet Take 81 mg by mouth daily.      . fluticasone (FLONASE) 50 MCG/ACT nasal spray Place 2 sprays into both nostrils daily. 16 g 6  . levothyroxine (SYNTHROID, LEVOTHROID) 100 MCG tablet TAKE 1 TABLET (100 MCG TOTAL)  BY MOUTH DAILY. 90 tablet 1  . lisinopril (PRINIVIL,ZESTRIL) 5 MG tablet Take 1 tablet (5 mg total) by mouth daily. 90 tablet 1  . loratadine (CLARITIN) 10 MG tablet Take 10 mg by mouth daily.      Marland Kitchen omeprazole (PRILOSEC) 40 MG capsule Take 1 capsule (40 mg total) by mouth daily. 30 capsule 5  . pravastatin (PRAVACHOL) 40 MG tablet Take 0.5 tablets (20 mg total) by mouth daily. NEED FOLLOW UP VISIT IN September. 45 tablet 2  . cyclobenzaprine (FLEXERIL) 5 MG tablet Take 1 tablet (5 mg total) by mouth 3 (three) times daily as needed for muscle spasms. 20 tablet 0  . naproxen (NAPROSYN) 500 MG tablet Take 1 tablet (500 mg total) by mouth 2 (two) times daily with a meal. 20 tablet 0   Allergies  Allergen Reactions  . Oxycodone Itching  . Simvastatin     REACTION: muscle pain   Social History   Social History  . Marital status: Married    Spouse name: Neoma Laming  . Number of children: 2   Occupational History  . [employer]   Social History Main Topics  . Smoking status: Never Smoker  . Smokeless tobacco: Never Used  . Alcohol use Yes     Comment: occasional  . Drug use: No   Social History Narrative   Previously Chartered loss adjuster [employer]    Objective:  VITAL SIGNS: BP 112/78 (BP Location: Left Arm, Patient Position: Sitting, Cuff Size: Large)   Pulse 90   Temp 98.1 F (36.7 C) (Oral)   Ht 5\' 8"  (1.727 m)   Wt 197 lb 9.6 oz (89.6 kg)   SpO2 97%   BMI 30.04 kg/m  Constitutional: Well formed, well developed. No acute distress. Thorax & Lungs: No accessory muscle use Skin: Warm. Dry. No erythema. No rash.  Musculoskeletal: low back.   Tenderness to palpation: yes over left lower thoracic and upper lumbar paraspinal musculature Deformity: no Ecchymosis: no Straight leg test: negative for +Poor flexibility of hamstrings b/l Neurologic: Normal sensory function in LE's b/l. No focal deficits noted.  DTR's equal and symmetry in LE's. No clonus. Psychiatric: Normal mood. Age appropriate judgment and insight. Alert & oriented x 3.    Assessment:  Acute left-sided low back pain without sciatica - Plan: cyclobenzaprine (FLEXERIL) 5 MG tablet, naproxen (NAPROSYN) 500 MG tablet  Plan: Orders as above. Stressed importance of stretching, particularly as we get older and lose strength and ROM. F/u if things worsen or fail to improve. Will consider trigger point injection vs PT. The patient voiced understanding and agreement to the plan.   [place], DO 09/23/16  1:47 PM

## 2016-09-23 NOTE — Therapy (Signed)
[facility] [address]  [place], [place], [address] Phone: [phone]   Fax:  [phone]  Physical Therapy Treatment  Patient Details  Name: Zachary [name] MRN: [id number] Date of Birth: 1951-09-09 Referring Provider: Dr. Corky Mull   Encounter Date: 09/23/2016      PT End of Session - 09/23/16 1011    Visit Number 25   Number of Visits 32   Date for PT Re-Evaluation 10/17/16   PT Start Time 0931   PT Stop Time 1011   PT Time Calculation (min) 40 min   Activity Tolerance Patient tolerated treatment well   Behavior During Therapy [facility] for tasks assessed/performed      Past Medical History:  Diagnosis Date  . Arthritis   . Articular cartilage disease    left shoulder  . Cancer ([place]) 2015   positive prostate cancer bx  . Diverticulitis   . Dyslipidemia (high LDL; low HDL) 08/15/2016  . GERD (gastroesophageal reflux disease)   . Hearing problem    hearing deficit  . Hemorrhoids   . Hyperlipidemia   . Hypertension   . Hypothyroidism   . Insomnia   . Sleep apnea    uses a cpap  . Wears glasses   . Wears hearing aid    both ears    Past Surgical History:  Procedure Laterality Date  . BACK SURGERY  1978   herniated disksurgery  . CARDIAC CATHETERIZATION  08/01/2010  . COLONOSCOPY    . EXAM UNDER ANESTHESIA WITH MANIPULATION OF SHOULDER Right 09/15/2014   Procedure: RIGHT SHOULDER MANIPULATION UNDER ANESTHESIA;  Surgeon: Ninetta Lights, MD;  Location: [place];  Service: Orthopedics;  Laterality: Right;  . HEMORRHOID SURGERY  08/2006  . SHOULDER ARTHROSCOPY WITH DISTAL CLAVICLE RESECTION Left 07/07/2015   Procedure: SHOULDER ARTHROSCOPY WITH DISTAL CLAVICLE RESECTION;  Surgeon: Kathryne Hitch, MD;  Location: [place];  Service: Orthopedics;  Laterality: Left;  . SHOULDER ARTHROSCOPY WITH ROTATOR CUFF REPAIR Left 12/07/2015   Procedure: LEFT SHOULDER ARTHROSCOPY  DEBRIDEMENT, WITH ROTATOR CUFF REPAIR;  Surgeon: Ninetta Lights, MD;  Location: [place];  Service: Orthopedics;  Laterality: Left;  . SHOULDER ARTHROSCOPY WITH ROTATOR CUFF REPAIR AND SUBACROMIAL DECOMPRESSION Left 07/07/2015   Procedure: LEFT SHOULDER SCOPE DEBRIDEMENT, SUBACROMIAL DECOMPRESSION, DISTAL CLAVICULECTOMY, ROTATOR CUFF REPAIR  ;  Surgeon: Kathryne Hitch, MD;  Location: [place];  Service: Orthopedics;  Laterality: Left;  ANESTHESIA: GENERAL, PRE/POST OP SCALENE  . SHOULDER ARTHROSCOPY WITH SUBACROMIAL DECOMPRESSION, ROTATOR CUFF REPAIR AND BICEP TENDON REPAIR Right 03/10/2014   Procedure: RIGHT SHOULDER ARTHROSCOPY WITH SUBACROMIAL DECOMPRESSION, PARTIAL ACROMIOPLASTY WITH CORACOAROMIAL LABRUM DEBRIDEMENT RELEASE DISTAL CLAVICULECTOMY,  ROTATOR CUFF REPAIR AND EXTENSIVE DEBRIDEMENT;  Surgeon: Ninetta Lights, MD;  Location: [place];  Service: Orthopedics;  Laterality: Right;  . TENDON REPAIR  June 06, 2011   right elbow, Dr. Percell Miller    There were no vitals filed for this visit.      Subjective Assessment - 09/23/16 0932    Subjective had a sharp pain over the weekend but resolved now.  not sure what provoked it - reports he did some work in the yard over the weekend but not much.   Patient is accompained by: Family member   Pertinent History 2 previous RTC repairs   Limitations Lifting;House hold activities   Patient Stated Goals return to work and full recovery   Currently in Pain? Yes  Pain Score 2    Pain Location Shoulder   Pain Orientation Left   Pain Descriptors / Indicators Aching;Tender   Pain Type Surgical pain   Pain Onset More than a month ago   Pain Frequency Intermittent   Aggravating Factors  sleeping on L side   Pain Relieving Factors stabilization                         [facility] Adult PT Treatment/Exercise - 09/23/16 0933      Shoulder Exercises: Supine   Protraction Left;20 reps;Weights    Protraction Weight (lbs) 6   Flexion Left;20 reps;Weights   Shoulder Flexion Weight (lbs) 3     Shoulder Exercises: Prone   Retraction Left;20 reps;Weights   Retraction Weight (lbs) 6   Flexion Left;20 reps;Weights   Flexion Weight (lbs) 2   Horizontal ABduction 1 Left;20 reps   Horizontal ABduction 1 Weight (lbs) 3     Shoulder Exercises: Sidelying   External Rotation Left;20 reps;Weights   External Rotation Weight (lbs) 3   ABduction Left;20 reps;Weights   ABduction Weight (lbs) 3     Shoulder Exercises: Standing   Flexion Left;10 reps;Weights   Shoulder Flexion Weight (lbs) 3   Flexion Limitations 2 sets; counter to cabinet height   Other Standing Exercises wall ladder with 1# cuff weight and 3 sec hold at end flexion   Other Standing Exercises static flexion hold 2# 10x5 sec at 90 degrees flexion     Shoulder Exercises: Pulleys   Flexion 3 minutes   ABduction 3 minutes   ABduction Limitations scaption     Shoulder Exercises: ROM/Strengthening   UBE (Upper Arm Bike) L 3.5 x 6 min; 3' fwd/3' bwd   Cybex Row Limitations 35# 2x15 both grips                  PT Short Term Goals - 09/16/16 0957      PT SHORT TERM GOAL #2   Title improve L shoulder AROM external rotation to at least 70 degrees for improved function (09/19/16)   Time 4   Period Weeks   Status Achieved           PT Long Term Goals - 09/16/16 1244      PT LONG TERM GOAL #1   Title Full PROM in order to progress into Phase II rehab (08/22/16)   Baseline external rotation not yet me   Status Partially Met     PT LONG TERM GOAL #2   Title L shoulder AROM: flexion 120; abduction at least 90; and ir/er at least 60 without substitution for progression into phase II (08/22/16)   Baseline --   Status Achieved     PT LONG TERM GOAL #3   Title demonstrate 4/5 strength in L shoulder for improved function (10/17/16)   Time 8   Period Weeks   Status On-going     PT LONG TERM GOAL #4   Title  report ability to sleep on L side with pain < 3/10 for improved function (10/17/16)   Time 8   Period Weeks   Status Partially Met  Pt. reporting able to sleep on L side without pain, "some nights".                 Plan - 09/23/16 1011    Clinical Impression Statement Pt continues to tolerate strengthening exercises well; having trouble and decreased ROM with active overhead reaching.  First  part of session focused on increased strength with forward flexion, pt reports increased fatigue with activities.  Will continue to benefit from PT to maximize function.   PT Treatment/Interventions ADLs/Self Care Home Management;Cryotherapy;Electrical Stimulation;Moist Heat;Ultrasound;Patient/family education;Neuromuscular re-education;Therapeutic exercise;Therapeutic activities;Functional mobility training;Manual techniques;Dry needling;Taping;Passive range of motion;Vasopneumatic Device   PT Next Visit Plan update HEP; continue strengthening; progress slowly   Consulted and Agree with Plan of Care Patient      Patient will benefit from skilled therapeutic intervention in order to improve the following deficits and impairments:  Postural dysfunction, Decreased strength, Impaired UE functional use, Pain, Decreased range of motion, Decreased scar mobility  Visit Diagnosis: Left shoulder pain, unspecified chronicity  Stiffness of left shoulder, not elsewhere classified     Problem List Patient Active Problem List   Diagnosis Date Noted  . Dyslipidemia (high LDL; low HDL) 08/15/2016  . Complete rotator cuff tear of left shoulder 07/05/2015  . Tennis elbow 05/30/2014  . Rotator cuff (capsule) sprain 03/10/2014  . Right shoulder pain 02/11/2014  . Memory difficulty 12/18/2012  . Gynecomastia, male 07/30/2012  . OSA (obstructive sleep apnea) 04/23/2011  . EXTERNAL HEMORRHOIDS 02/12/2011  . Prostate cancer ([place]) 11/12/2010  . PLANTAR FASCIITIS 04/09/2010  . Hypothyroidism 12/08/2009  .  NEOPLASM OF UNCERTAIN BEHAVIOR OF SKIN 03/10/2009  . OSTEOARTHRITIS 02/14/2009  . HEARING DEFICIT 02/08/2009  . Essential hypertension 02/08/2009  . ALLERGIC RHINITIS 02/08/2009  . GERD 02/08/2009  . PROTEINURIA 02/08/2009  . DIVERTICULITIS, HX OF 02/08/2009      Laureen Abrahams, PT, DPT 09/23/16 10:14 AM   [facility] [address]  [place], [place], [address] Phone: [phone]   Fax:  [phone]  Name: LOCHLAN GRYGIEL MRN: [id number] Date of Birth: 01-Apr-1951

## 2016-09-25 MED FILL — OMEPRAZOLE DR 40 MG CAPSULE: 40 | 90 days supply | Qty: 90 | Fill #1

## 2016-09-26 ENCOUNTER — Ambulatory Visit: Payer: 59 | Admitting: Pulmonary Disease

## 2016-09-26 ENCOUNTER — Ambulatory Visit: Payer: 59

## 2016-09-27 ENCOUNTER — Other Ambulatory Visit: Payer: Self-pay | Admitting: *Deleted

## 2016-09-27 ENCOUNTER — Ambulatory Visit: Payer: 59

## 2016-09-27 DIAGNOSIS — M25612 Stiffness of left shoulder, not elsewhere classified: Secondary | ICD-10-CM

## 2016-09-27 DIAGNOSIS — M25512 Pain in left shoulder: Secondary | ICD-10-CM | POA: Diagnosis not present

## 2016-09-27 MED ORDER — LEVOTHYROXINE SODIUM 100 MCG PO TABS: ORAL_TABLET | ORAL | 1 refills | Status: DC

## 2016-09-27 MED FILL — LEVOTHYROXINE 100 MCG TAB: 100 | 90 days supply | Qty: 90 | Fill #0

## 2016-09-27 NOTE — Telephone Encounter (Signed)
Rx sent to the pharmacy by e-script.//AB/CMA 

## 2016-09-27 NOTE — Therapy (Signed)
[facility] [address]  [place], [place], [address] Phone: [phone]   Fax:  [phone]  Physical Therapy Treatment  Patient Details  Name: Zachary [name] MRN: [id number] Date of Birth: April 10, 1951 Referring Provider: Dr. Corky Mull   Encounter Date: 09/27/2016      PT End of Session - 09/27/16 1020    Visit Number 26   Number of Visits 32   Date for PT Re-Evaluation 10/17/16   PT Start Time 0932   PT Stop Time 1014   PT Time Calculation (min) 42 min   Activity Tolerance Patient tolerated treatment well   Behavior During Therapy [facility] for tasks assessed/performed      Past Medical History:  Diagnosis Date  . Arthritis   . Articular cartilage disease    left shoulder  . Cancer ([place]) 2015   positive prostate cancer bx  . Diverticulitis   . Dyslipidemia (high LDL; low HDL) 08/15/2016  . GERD (gastroesophageal reflux disease)   . Hearing problem    hearing deficit  . Hemorrhoids   . Hypertension   . Hypothyroidism   . Sleep apnea    uses a cpap  . Wears glasses   . Wears hearing aid    both ears    Past Surgical History:  Procedure Laterality Date  . BACK SURGERY  1978   herniated disksurgery  . CARDIAC CATHETERIZATION  08/01/2010  . COLONOSCOPY    . EXAM UNDER ANESTHESIA WITH MANIPULATION OF SHOULDER Right 09/15/2014   Procedure: RIGHT SHOULDER MANIPULATION UNDER ANESTHESIA;  Surgeon: Ninetta Lights, MD;  Location: [place];  Service: Orthopedics;  Laterality: Right;  . HEMORRHOID SURGERY  08/2006  . SHOULDER ARTHROSCOPY WITH DISTAL CLAVICLE RESECTION Left 07/07/2015   Procedure: SHOULDER ARTHROSCOPY WITH DISTAL CLAVICLE RESECTION;  Surgeon: Kathryne Hitch, MD;  Location: [place];  Service: Orthopedics;  Laterality: Left;  . SHOULDER ARTHROSCOPY WITH ROTATOR CUFF REPAIR Left 12/07/2015   Procedure: LEFT SHOULDER ARTHROSCOPY DEBRIDEMENT, WITH ROTATOR CUFF  REPAIR;  Surgeon: Ninetta Lights, MD;  Location: [place];  Service: Orthopedics;  Laterality: Left;  . SHOULDER ARTHROSCOPY WITH ROTATOR CUFF REPAIR AND SUBACROMIAL DECOMPRESSION Left 07/07/2015   Procedure: LEFT SHOULDER SCOPE DEBRIDEMENT, SUBACROMIAL DECOMPRESSION, DISTAL CLAVICULECTOMY, ROTATOR CUFF REPAIR  ;  Surgeon: Kathryne Hitch, MD;  Location: [place];  Service: Orthopedics;  Laterality: Left;  ANESTHESIA: GENERAL, PRE/POST OP SCALENE  . SHOULDER ARTHROSCOPY WITH SUBACROMIAL DECOMPRESSION, ROTATOR CUFF REPAIR AND BICEP TENDON REPAIR Right 03/10/2014   Procedure: RIGHT SHOULDER ARTHROSCOPY WITH SUBACROMIAL DECOMPRESSION, PARTIAL ACROMIOPLASTY WITH CORACOAROMIAL LABRUM DEBRIDEMENT RELEASE DISTAL CLAVICULECTOMY,  ROTATOR CUFF REPAIR AND EXTENSIVE DEBRIDEMENT;  Surgeon: Ninetta Lights, MD;  Location: [place];  Service: Orthopedics;  Laterality: Right;  . TENDON REPAIR  June 06, 2011   right elbow, Dr. Percell Miller    There were no vitals filed for this visit.      Subjective Assessment - 09/27/16 0935    Subjective Pt. reporting L shoulder is feling better following last weekend's, "flare up"   Patient Stated Goals return to work and full recovery   Currently in Pain? Yes   Pain Score 1    Pain Location Shoulder   Pain Orientation Left   Pain Descriptors / Indicators Aching   Pain Type Surgical pain   Pain Onset More than a month ago   Multiple Pain Sites No  [facility] Adult PT Treatment/Exercise - 09/27/16 0950      Shoulder Exercises: Supine   Protraction Left;20 reps;Weights   Protraction Weight (lbs) 6   Protraction Limitations 2 sets   Horizontal ABduction Both;15 reps;Theraband   Theraband Level (Shoulder Horizontal ABduction) Level 4 (Blue)   Horizontal ABduction Limitations 2 sets   External Rotation 20 reps   Theraband Level (Shoulder External Rotation) Level 3 (Green)   External Rotation Limitations 2 sets      Shoulder Exercises: Prone   Retraction Left;15 reps   Retraction Weight (lbs) 7   Extension Weight (lbs) 5   Extension Limitations 2 sets     Shoulder Exercises: Standing   Flexion Left;10 reps;Weights   Shoulder Flexion Weight (lbs) 3   Flexion Limitations second set not performed due to fatigue   Other Standing Exercises wall ladder with 1# cuff weight and 3 sec hold at end flexion     Shoulder Exercises: ROM/Strengthening   UBE (Upper Arm Bike) L 3.5 x 6 min; 3' fwd/3' bwd   Cybex Row Limitations 45# 2 x 10 low grip   Wall Pushups 15 reps;20 reps   Pushups Limitations with 5 sec hold                PT Education - 09/27/16 1050    Education Details pt. instructed to perform IR HEP activity with green TB instead of red, no other changes made to HEP   Methods Explanation   Comprehension Verbalized understanding          PT Short Term Goals - 09/16/16 0957      PT SHORT TERM GOAL #2   Title improve L shoulder AROM external rotation to at least 70 degrees for improved function (09/19/16)   Time 4   Period Weeks   Status Achieved           PT Long Term Goals - 09/27/16 0946      PT LONG TERM GOAL #1   Title Full PROM in order to progress into Phase II rehab (08/22/16)   Baseline --   Status Achieved     PT LONG TERM GOAL #2   Title L shoulder AROM: flexion 120; abduction at least 90; and ir/er at least 60 without substitution for progression into phase II (08/22/16)   Status Achieved     PT LONG TERM GOAL #3   Title demonstrate 4/5 strength in L shoulder for improved function (10/17/16)   Time 8   Period Weeks   Status On-going     PT LONG TERM GOAL #4   Title report ability to sleep on L side with pain < 3/10 for improved function (10/17/16)   Time 8   Period Weeks   Status On-going  10.6.17: Pt. reporting pain up to 6/10 while sleeping on L side.  Pt. still avoiding sleeping on this side due to pain.                 Plan - 09/27/16  1021    Clinical Impression Statement Pt. progressing well per protocol today with L shoulder pain greatly reduced today following, "flare up" last weekend.  Pt. tolerated increased repetitions and resistance with almost all L shoulder strengthening activities today with pain remaining < 5/10 and instructed to perform IR HEP activity with green TB.  Pt. has MD f/u upcoming on 10/13.   PT Treatment/Interventions ADLs/Self Care Home Management;Cryotherapy;Electrical Stimulation;Moist Heat;Ultrasound;Patient/family education;Neuromuscular re-education;Therapeutic exercise;Therapeutic activities;Functional mobility training;Manual techniques;Dry needling;Taping;Passive range of  motion;Vasopneumatic Device   PT Next Visit Plan continue strengthening; progress slowly      Patient will benefit from skilled therapeutic intervention in order to improve the following deficits and impairments:  Postural dysfunction, Decreased strength, Impaired UE functional use, Pain, Decreased range of motion, Decreased scar mobility  Visit Diagnosis: Stiffness of left shoulder, not elsewhere classified     Problem List Patient Active Problem List   Diagnosis Date Noted  . Dyslipidemia (high LDL; low HDL) 08/15/2016  . Complete rotator cuff tear of left shoulder 07/05/2015  . Tennis elbow 05/30/2014  . Rotator cuff (capsule) sprain 03/10/2014  . Right shoulder pain 02/11/2014  . Memory difficulty 12/18/2012  . Gynecomastia, male 07/30/2012  . OSA (obstructive sleep apnea) 04/23/2011  . EXTERNAL HEMORRHOIDS 02/12/2011  . Prostate cancer ([place]) 11/12/2010  . PLANTAR FASCIITIS 04/09/2010  . Hypothyroidism 12/08/2009  . NEOPLASM OF UNCERTAIN BEHAVIOR OF SKIN 03/10/2009  . OSTEOARTHRITIS 02/14/2009  . HEARING DEFICIT 02/08/2009  . Essential hypertension 02/08/2009  . ALLERGIC RHINITIS 02/08/2009  . GERD 02/08/2009  . PROTEINURIA 02/08/2009  . DIVERTICULITIS, HX OF 02/08/2009    Bess Harvest, PTA 09/27/2016,  10:57 AM  [facility] [address]  [place], [place], [address] Phone: [phone]   Fax:  [phone]  Name: Zachary [name] MRN: [id number] Date of Birth: [birth year]

## 2016-09-30 ENCOUNTER — Ambulatory Visit: Payer: 59 | Admitting: Physical Therapy

## 2016-09-30 DIAGNOSIS — M25612 Stiffness of left shoulder, not elsewhere classified: Secondary | ICD-10-CM

## 2016-09-30 DIAGNOSIS — M25512 Pain in left shoulder: Secondary | ICD-10-CM

## 2016-09-30 NOTE — Therapy (Signed)
[facility] [address]  [place], [place], [address] Phone: [phone]   Fax:  [phone]  Physical Therapy Treatment  Patient Details  Name: Zachary [name] MRN: [id number] Date of Birth: 02-18-1951 Referring Provider: Dr. Corky Mull   Encounter Date: 09/30/2016      PT End of Session - 09/30/16 0922    Visit Number 27   Number of Visits 32   Date for PT Re-Evaluation 10/17/16   PT Start Time 0845   PT Stop Time 0923   PT Time Calculation (min) 38 min   Activity Tolerance Patient tolerated treatment well   Behavior During Therapy [facility] for tasks assessed/performed      Past Medical History:  Diagnosis Date  . Arthritis   . Articular cartilage disease    left shoulder  . Cancer ([place]) 2015   positive prostate cancer bx  . Diverticulitis   . Dyslipidemia (high LDL; low HDL) 08/15/2016  . GERD (gastroesophageal reflux disease)   . Hearing problem    hearing deficit  . Hemorrhoids   . Hypertension   . Hypothyroidism   . Sleep apnea    uses a cpap  . Wears glasses   . Wears hearing aid    both ears    Past Surgical History:  Procedure Laterality Date  . BACK SURGERY  1978   herniated disksurgery  . CARDIAC CATHETERIZATION  08/01/2010  . COLONOSCOPY    . EXAM UNDER ANESTHESIA WITH MANIPULATION OF SHOULDER Right 09/15/2014   Procedure: RIGHT SHOULDER MANIPULATION UNDER ANESTHESIA;  Surgeon: Ninetta Lights, MD;  Location: [place];  Service: Orthopedics;  Laterality: Right;  . HEMORRHOID SURGERY  08/2006  . SHOULDER ARTHROSCOPY WITH DISTAL CLAVICLE RESECTION Left 07/07/2015   Procedure: SHOULDER ARTHROSCOPY WITH DISTAL CLAVICLE RESECTION;  Surgeon: Kathryne Hitch, MD;  Location: [place];  Service: Orthopedics;  Laterality: Left;  . SHOULDER ARTHROSCOPY WITH ROTATOR CUFF REPAIR Left 12/07/2015   Procedure: LEFT SHOULDER ARTHROSCOPY DEBRIDEMENT, WITH ROTATOR CUFF  REPAIR;  Surgeon: Ninetta Lights, MD;  Location: [place];  Service: Orthopedics;  Laterality: Left;  . SHOULDER ARTHROSCOPY WITH ROTATOR CUFF REPAIR AND SUBACROMIAL DECOMPRESSION Left 07/07/2015   Procedure: LEFT SHOULDER SCOPE DEBRIDEMENT, SUBACROMIAL DECOMPRESSION, DISTAL CLAVICULECTOMY, ROTATOR CUFF REPAIR  ;  Surgeon: Kathryne Hitch, MD;  Location: [place];  Service: Orthopedics;  Laterality: Left;  ANESTHESIA: GENERAL, PRE/POST OP SCALENE  . SHOULDER ARTHROSCOPY WITH SUBACROMIAL DECOMPRESSION, ROTATOR CUFF REPAIR AND BICEP TENDON REPAIR Right 03/10/2014   Procedure: RIGHT SHOULDER ARTHROSCOPY WITH SUBACROMIAL DECOMPRESSION, PARTIAL ACROMIOPLASTY WITH CORACOAROMIAL LABRUM DEBRIDEMENT RELEASE DISTAL CLAVICULECTOMY,  ROTATOR CUFF REPAIR AND EXTENSIVE DEBRIDEMENT;  Surgeon: Ninetta Lights, MD;  Location: [place];  Service: Orthopedics;  Laterality: Right;  . TENDON REPAIR  June 06, 2011   right elbow, Dr. Percell Miller    There were no vitals filed for this visit.      Subjective Assessment - 09/30/16 0846    Subjective shoulder has been feeling "alright."   Pertinent History 2 previous RTC repairs   Limitations Lifting;House hold activities   Patient Stated Goals return to work and full recovery   Currently in Pain? No/denies            [facility] PT Assessment - 09/30/16 0857      AROM   Left Shoulder Flexion 160 Degrees   Left Shoulder ABduction 160 Degrees   Left Shoulder Internal  Rotation 80 Degrees   Left Shoulder External Rotation 70 Degrees     PROM   Left Shoulder External Rotation 75 Degrees  after manual                     [facility] Adult PT Treatment/Exercise - 09/30/16 0847      Shoulder Exercises: Seated   Flexion 15 reps;Weights   Flexion Weight (lbs) 2   Flexion Limitations 3 sec hold at 90 degrees flexion   Abduction Left;15 reps;Weights   ABduction Weight (lbs) 2   ABduction Limitations eccentric hold  x 3 sec     Shoulder Exercises: Standing   External Rotation Left;15 reps;Theraband   Theraband Level (Shoulder External Rotation) Level 3 (Green)   External Rotation Limitations 2 sets   Internal Rotation Right;15 reps;Theraband   Theraband Level (Shoulder Internal Rotation) Level 3 (Green)   Internal Rotation Limitations 2 sets   Extension Both;15 reps;Theraband   Theraband Level (Shoulder Extension) Level 3 (Green)   Extension Limitations 2 sets     Shoulder Exercises: ROM/Strengthening   UBE (Upper Arm Bike) L 3.5 x 6 min; 3' fwd/3' bwd   Cybex Row Limitations 35# 2x15 both grips   Wall Pushups 15 reps   Pushups Limitations counter height   Ball on Wall red med ball x15 all directions     Manual Therapy   Passive ROM supine external rotation L shoulder in 40, 60, 90 degrees abduction (in scapular plane)                  PT Short Term Goals - 09/16/16 0957      PT SHORT TERM GOAL #2   Title improve L shoulder AROM external rotation to at least 70 degrees for improved function (09/19/16)   Time 4   Period Weeks   Status Achieved           PT Long Term Goals - 09/27/16 0946      PT LONG TERM GOAL #1   Title Full PROM in order to progress into Phase II rehab (08/22/16)   Baseline --   Status Achieved     PT LONG TERM GOAL #2   Title L shoulder AROM: flexion 120; abduction at least 90; and ir/er at least 60 without substitution for progression into phase II (08/22/16)   Status Achieved     PT LONG TERM GOAL #3   Title demonstrate 4/5 strength in L shoulder for improved function (10/17/16)   Time 8   Period Weeks   Status On-going     PT LONG TERM GOAL #4   Title report ability to sleep on L side with pain < 3/10 for improved function (10/17/16)   Time 8   Period Weeks   Status On-going  10.6.17: Pt. reporting pain up to 6/10 while sleeping on L side.  Pt. still avoiding sleeping on this side due to pain.                 Plan - 09/30/16 [id number]     Clinical Impression Statement Pt with AROM WNL except external rotation still slightly limited.  Continues to tolerate strengthening exercises well with no c/o pain just soreness.  Will continue to benefit from PT to maximize function.   PT Treatment/Interventions ADLs/Self Care Home Management;Cryotherapy;Electrical Stimulation;Moist Heat;Ultrasound;Patient/family education;Neuromuscular re-education;Therapeutic exercise;Therapeutic activities;Functional mobility training;Manual techniques;Dry needling;Taping;Passive range of motion;Vasopneumatic Device   PT Next Visit Plan continue strengthening; progress slowly; will need MD note  Consulted and Agree with Plan of Care Patient      Patient will benefit from skilled therapeutic intervention in order to improve the following deficits and impairments:  Postural dysfunction, Decreased strength, Impaired UE functional use, Pain, Decreased range of motion, Decreased scar mobility  Visit Diagnosis: Stiffness of left shoulder, not elsewhere classified  Left shoulder pain, unspecified chronicity     Problem List Patient Active Problem List   Diagnosis Date Noted  . Dyslipidemia (high LDL; low HDL) 08/15/2016  . Complete rotator cuff tear of left shoulder 07/05/2015  . Tennis elbow 05/30/2014  . Rotator cuff (capsule) sprain 03/10/2014  . Right shoulder pain 02/11/2014  . Memory difficulty 12/18/2012  . Gynecomastia, male 07/30/2012  . OSA (obstructive sleep apnea) 04/23/2011  . EXTERNAL HEMORRHOIDS 02/12/2011  . Prostate cancer ([place]) 11/12/2010  . PLANTAR FASCIITIS 04/09/2010  . Hypothyroidism 12/08/2009  . NEOPLASM OF UNCERTAIN BEHAVIOR OF SKIN 03/10/2009  . OSTEOARTHRITIS 02/14/2009  . HEARING DEFICIT 02/08/2009  . Essential hypertension 02/08/2009  . ALLERGIC RHINITIS 02/08/2009  . GERD 02/08/2009  . PROTEINURIA 02/08/2009  . DIVERTICULITIS, HX OF 02/08/2009      Laureen Abrahams, PT, DPT 09/30/16 9:25 AM   [facility] [address]  [place], [place], [address] Phone: [phone]   Fax:  [phone]  Name: Zachary [name] MRN: [id number] Date of Birth: 19-Jun-1951

## 2016-10-03 ENCOUNTER — Ambulatory Visit (INDEPENDENT_AMBULATORY_CARE_PROVIDER_SITE_OTHER): Payer: 59 | Admitting: Adult Health

## 2016-10-03 ENCOUNTER — Ambulatory Visit: Payer: 59 | Admitting: Physical Therapy

## 2016-10-03 ENCOUNTER — Encounter: Payer: Self-pay | Admitting: Adult Health

## 2016-10-03 VITALS — BP 144/84 | HR 62 | Ht 68.0 in | Wt 204.0 lb

## 2016-10-03 DIAGNOSIS — G4733 Obstructive sleep apnea (adult) (pediatric): Secondary | ICD-10-CM

## 2016-10-03 DIAGNOSIS — M25612 Stiffness of left shoulder, not elsewhere classified: Secondary | ICD-10-CM | POA: Diagnosis not present

## 2016-10-03 DIAGNOSIS — M25512 Pain in left shoulder: Secondary | ICD-10-CM

## 2016-10-03 NOTE — Patient Instructions (Signed)
Continue on CPAP At bedtime   Keep up good work.  Stay active.  Do not drive if sleepy .  Follow up Dr. Elsworth Soho  In 1 year and As needed

## 2016-10-03 NOTE — Therapy (Signed)
[facility] [address]  [place], [place], [address] Phone: [phone]   Fax:  [phone]  Physical Therapy Treatment  Patient Details  Name: Zachary [name] MRN: [id number] Date of Birth: 05/15/1951 Referring Provider: Dr. Corky Mull   Encounter Date: 10/03/2016      PT End of Session - 10/03/16 1012    Visit Number 28   Number of Visits 32   Date for PT Re-Evaluation 10/17/16   PT Start Time 0930   PT Stop Time 1012   PT Time Calculation (min) 42 min   Activity Tolerance Patient tolerated treatment well   Behavior During Therapy [facility] for tasks assessed/performed      Past Medical History:  Diagnosis Date  . Arthritis   . Articular cartilage disease    left shoulder  . Cancer ([place]) 2015   positive prostate cancer bx  . Diverticulitis   . Dyslipidemia (high LDL; low HDL) 08/15/2016  . GERD (gastroesophageal reflux disease)   . Hearing problem    hearing deficit  . Hemorrhoids   . Hypertension   . Hypothyroidism   . Sleep apnea    uses a cpap  . Wears glasses   . Wears hearing aid    both ears    Past Surgical History:  Procedure Laterality Date  . BACK SURGERY  1978   herniated disksurgery  . CARDIAC CATHETERIZATION  08/01/2010  . COLONOSCOPY    . EXAM UNDER ANESTHESIA WITH MANIPULATION OF SHOULDER Right 09/15/2014   Procedure: RIGHT SHOULDER MANIPULATION UNDER ANESTHESIA;  Surgeon: Ninetta Lights, MD;  Location: [place];  Service: Orthopedics;  Laterality: Right;  . HEMORRHOID SURGERY  08/2006  . SHOULDER ARTHROSCOPY WITH DISTAL CLAVICLE RESECTION Left 07/07/2015   Procedure: SHOULDER ARTHROSCOPY WITH DISTAL CLAVICLE RESECTION;  Surgeon: Kathryne Hitch, MD;  Location: [place];  Service: Orthopedics;  Laterality: Left;  . SHOULDER ARTHROSCOPY WITH ROTATOR CUFF REPAIR Left 12/07/2015   Procedure: LEFT SHOULDER ARTHROSCOPY DEBRIDEMENT, WITH ROTATOR CUFF  REPAIR;  Surgeon: Ninetta Lights, MD;  Location: [place];  Service: Orthopedics;  Laterality: Left;  . SHOULDER ARTHROSCOPY WITH ROTATOR CUFF REPAIR AND SUBACROMIAL DECOMPRESSION Left 07/07/2015   Procedure: LEFT SHOULDER SCOPE DEBRIDEMENT, SUBACROMIAL DECOMPRESSION, DISTAL CLAVICULECTOMY, ROTATOR CUFF REPAIR  ;  Surgeon: Kathryne Hitch, MD;  Location: [place];  Service: Orthopedics;  Laterality: Left;  ANESTHESIA: GENERAL, PRE/POST OP SCALENE  . SHOULDER ARTHROSCOPY WITH SUBACROMIAL DECOMPRESSION, ROTATOR CUFF REPAIR AND BICEP TENDON REPAIR Right 03/10/2014   Procedure: RIGHT SHOULDER ARTHROSCOPY WITH SUBACROMIAL DECOMPRESSION, PARTIAL ACROMIOPLASTY WITH CORACOAROMIAL LABRUM DEBRIDEMENT RELEASE DISTAL CLAVICULECTOMY,  ROTATOR CUFF REPAIR AND EXTENSIVE DEBRIDEMENT;  Surgeon: Ninetta Lights, MD;  Location: [place];  Service: Orthopedics;  Laterality: Right;  . TENDON REPAIR  June 06, 2011   right elbow, Dr. Percell Miller    There were no vitals filed for this visit.      Subjective Assessment - 10/03/16 0931    Subjective doing well; no complaints.  pain 2-3/10 when sleeping on L side   Pertinent History 2 previous RTC repairs   Patient Stated Goals return to work and full recovery   Currently in Pain? No/denies            [facility] PT Assessment - 10/03/16 0945      AROM   Left Shoulder Flexion 160 Degrees   Left Shoulder ABduction 160 Degrees   Left Shoulder  Internal Rotation 80 Degrees   Left Shoulder External Rotation 73 Degrees     PROM   Left Shoulder External Rotation 75 Degrees  after manual     Strength   Strength Assessment Site Shoulder   Left Shoulder Flexion 4/5   Left Shoulder ABduction 3/5   Left Shoulder Internal Rotation 4/5   Left Shoulder External Rotation 3/5                     [facility] Adult PT Treatment/Exercise - 10/03/16 0933      Shoulder Exercises: Standing   External Rotation Left;15  reps;Theraband   Theraband Level (Shoulder External Rotation) Level 3 (Green)   External Rotation Limitations 2 sets   ABduction Left;15 reps;Theraband   Theraband Level (Shoulder ABduction) Level 1 (Yellow)   ABduction Limitations to 90 degrees; 2 sets   Extension Both;15 reps;Theraband   Theraband Level (Shoulder Extension) Level 3 (Green)   Extension Limitations 2 sets; 5 sec scap retraction hold     Shoulder Exercises: Pulleys   Flexion 3 minutes   ABduction 3 minutes   ABduction Limitations scaption     Shoulder Exercises: ROM/Strengthening   UBE (Upper Arm Bike) L 3.5 x 8 min; 4' fwd/4' bwd   Cybex Row Limitations 35# 2x15 both grips     Shoulder Exercises: Body Blade   Flexion 30 seconds;1 rep   ABduction 30 seconds;1 rep   External Rotation 30 seconds;1 rep     Manual Therapy   Passive ROM supine external rotation L shoulder in 40, 60, 90 degrees abduction (in scapular plane)                  PT Short Term Goals - 10/03/16 1012      PT SHORT TERM GOAL #1   Title L shoulder PROM Flexion to 120, ER to 60 at 90 ABD, and IR to 45 at 90 ABD by 07/25/16   Status Achieved     PT SHORT TERM GOAL #2   Title improve L shoulder AROM external rotation to at least 70 degrees for improved function (09/19/16)   Status Achieved           PT Long Term Goals - 10/03/16 1012      PT LONG TERM GOAL #1   Title Full PROM in order to progress into Phase II rehab (08/22/16)   Status Achieved     PT LONG TERM GOAL #2   Title L shoulder AROM: flexion 120; abduction at least 90; and ir/er at least 60 without substitution for progression into phase II (08/22/16)   Status Achieved     PT LONG TERM GOAL #3   Title demonstrate 4/5 strength in L shoulder for improved function (10/17/16)   Baseline 10/03/16: flexion and internal rotation met   Status On-going     PT LONG TERM GOAL #4   Title report ability to sleep on L side with pain < 3/10 for improved function (10/17/16)    Status Achieved               Plan - 10/03/16 1013    Clinical Impression Statement Pt progressing well with only AROM limitations at this time is external rotation.  L shoulder flexion and internal rotation strength 4/5; with abduction and external rotation still limited.  Progressing well at this time.  Will continue to benefit from PT to maximize function and improve strength.   PT Treatment/Interventions ADLs/Self Care Home Management;Cryotherapy;Electrical Stimulation;Moist Heat;Ultrasound;Patient/family  education;Neuromuscular re-education;Therapeutic exercise;Therapeutic activities;Functional mobility training;Manual techniques;Dry needling;Taping;Passive range of motion;Vasopneumatic Device   PT Next Visit Plan continue strengthening; progress slowly   Consulted and Agree with Plan of Care Patient      Patient will benefit from skilled therapeutic intervention in order to improve the following deficits and impairments:  Postural dysfunction, Decreased strength, Impaired UE functional use, Pain, Decreased range of motion, Decreased scar mobility  Visit Diagnosis: Stiffness of left shoulder, not elsewhere classified  Left shoulder pain, unspecified chronicity     Problem List Patient Active Problem List   Diagnosis Date Noted  . Dyslipidemia (high LDL; low HDL) 08/15/2016  . Complete rotator cuff tear of left shoulder 07/05/2015  . Tennis elbow 05/30/2014  . Rotator cuff (capsule) sprain 03/10/2014  . Right shoulder pain 02/11/2014  . Memory difficulty 12/18/2012  . Gynecomastia, male 07/30/2012  . OSA (obstructive sleep apnea) 04/23/2011  . EXTERNAL HEMORRHOIDS 02/12/2011  . Prostate cancer ([place]) 11/12/2010  . PLANTAR FASCIITIS 04/09/2010  . Hypothyroidism 12/08/2009  . NEOPLASM OF UNCERTAIN BEHAVIOR OF SKIN 03/10/2009  . OSTEOARTHRITIS 02/14/2009  . HEARING DEFICIT 02/08/2009  . Essential hypertension 02/08/2009  . ALLERGIC RHINITIS 02/08/2009  . GERD  02/08/2009  . PROTEINURIA 02/08/2009  . DIVERTICULITIS, HX OF 02/08/2009       Laureen Abrahams, PT, DPT 10/03/16 10:16 AM    [facility] [address]  [place], [place], [address] Phone: [phone]   Fax:  [phone]  Name: ORYAN WINTERTON MRN: [id number] Date of Birth: February 04, 1951

## 2016-10-03 NOTE — Assessment & Plan Note (Signed)
Doing well on CPAP   Plan  Patient Instructions  Continue on CPAP At bedtime   Keep up good work.  Stay active.  Do not drive if sleepy .  Follow up Dr. Elsworth Soho  In 1 year and As needed

## 2016-10-03 NOTE — Progress Notes (Signed)
Subjective:    Patient ID: Zachary [name], male    DOB: June 04, 1951, 65 y.o.   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 Some bruxism was noted  9/12 > changed to 11 cm based on download    10/03/2016 Follow up : OSA  Pt returns for annual sleep apnea follow up  He is doing very well on CPAP . Feels rested without significant daytime sleepiness.  Wears for 5-6hr each night .  Needs order for CPAP supplies CPAP download shows good compliance with AHI 1.1 . Min leaks. Usage 5hr.  Denies chest pain, orthopnea, edema or fever.    Past Medical History:  Diagnosis Date  . Arthritis   . Articular cartilage disease    left shoulder  . Cancer ([place]) 2015   positive prostate cancer bx  . Diverticulitis   . Dyslipidemia (high LDL; low HDL) 08/15/2016  . GERD (gastroesophageal reflux disease)   . Hearing problem    hearing deficit  . Hemorrhoids   . Hypertension   . Hypothyroidism   . Sleep apnea    uses a cpap  . Wears glasses   . Wears hearing aid    both ears   Current Outpatient Prescriptions on File Prior to Visit  Medication Sig Dispense Refill  . acetaminophen (TYLENOL) 500 MG tablet Take 500 mg by mouth every 6 (six) hours as needed.    Marland Kitchen aspirin EC 81 MG EC tablet Take 81 mg by mouth daily.      . cyclobenzaprine (FLEXERIL) 5 MG tablet Take 1 tablet (5 mg total) by mouth 3 (three) times daily as needed for muscle spasms. 20 tablet 0  . fluticasone (FLONASE) 50 MCG/ACT nasal spray Place 2 sprays into both nostrils daily. 16 g 6  . levothyroxine (SYNTHROID, LEVOTHROID) 100 MCG tablet TAKE 1 TABLET (100 MCG TOTAL) BY MOUTH DAILY. 90 tablet 1  . lisinopril (PRINIVIL,ZESTRIL) 5 MG tablet Take 1 tablet (5 mg total) by mouth daily. 90 tablet 1  . loratadine (CLARITIN) 10 MG tablet Take 10 mg by  mouth daily.      . naproxen (NAPROSYN) 500 MG tablet Take 1 tablet (500 mg total) by mouth 2 (two) times daily with a meal. 20 tablet 0  . omeprazole (PRILOSEC) 40 MG capsule Take 1 capsule (40 mg total) by mouth daily. 30 capsule 5  . pravastatin (PRAVACHOL) 40 MG tablet Take 0.5 tablets (20 mg total) by mouth daily. NEED FOLLOW UP VISIT IN September. 45 tablet 2   No current facility-administered medications on file prior to visit.     Review of Systems   Constitutional:   No  weight loss, night sweats,  Fevers, chills, fatigue, or  lassitude.  HEENT:   No headaches,  Difficulty swallowing,  Tooth/dental problems, or  Sore throat,                No sneezing, itching, ear ache, nasal congestion, post nasal drip,   CV:  No chest pain,  Orthopnea, PND, swelling in lower extremities, anasarca, dizziness, palpitations, syncope.   GI  No heartburn, indigestion, abdominal pain, nausea, vomiting, diarrhea, change in bowel habits, loss of appetite, bloody stools.   Resp: No shortness of breath with exertion or at rest.  No excess mucus, no productive cough,  No non-productive cough,  No coughing up of blood.  No change in color of mucus.  No wheezing.  No chest wall deformity  Skin: no rash or lesions.  GU: no dysuria, change in color of urine, no urgency or frequency.  No flank pain, no hematuria   MS:  No joint pain or swelling.  No decreased range of motion.  No back pain.  Psych:  No change in mood or affect. No depression or anxiety.  No memory loss.        Objective:   Physical Exam Vitals:   10/03/16 1109  BP: (!) 144/84  Pulse: 62  SpO2: 96%  Weight: 204 lb (92.5 kg)  Height: 5\' 8"  (1.727 m)  Body mass index is 31.02 kg/m.    Gen. Pleasant, well-nourished, in no distress ENT - no lesions, no post nasal drip ,, class 2-3 MP airway  Neck: No JVD, no thyromegaly, no carotid bruits Lungs: no use of accessory muscles, no dullness to percussion, clear without rales or  rhonchi  Cardiovascular: Rhythm regular, heart sounds  normal, no murmurs or gallops, no peripheral edema Musculoskeletal: No deformities, no cyanosis or clubbing  Neuro -non focal   Tahj Lindseth NP-C  [facility]  10/03/2016

## 2016-10-04 DIAGNOSIS — M25511 Pain in right shoulder: Secondary | ICD-10-CM | POA: Diagnosis not present

## 2016-10-04 DIAGNOSIS — M25521 Pain in right elbow: Secondary | ICD-10-CM | POA: Diagnosis not present

## 2016-10-04 DIAGNOSIS — Z9889 Other specified postprocedural states: Secondary | ICD-10-CM | POA: Diagnosis not present

## 2016-10-04 NOTE — Progress Notes (Signed)
Reviewed & agree with plan  

## 2016-10-07 ENCOUNTER — Ambulatory Visit: Payer: 59

## 2016-10-07 ENCOUNTER — Other Ambulatory Visit: Payer: Self-pay | Admitting: Family Medicine

## 2016-10-07 DIAGNOSIS — M25612 Stiffness of left shoulder, not elsewhere classified: Secondary | ICD-10-CM

## 2016-10-07 DIAGNOSIS — I10 Essential (primary) hypertension: Secondary | ICD-10-CM

## 2016-10-07 DIAGNOSIS — M25512 Pain in left shoulder: Secondary | ICD-10-CM | POA: Diagnosis not present

## 2016-10-07 MED ORDER — LISINOPRIL 5 MG PO TABS: 5.0000 mg | ORAL_TABLET | Freq: Every day | ORAL | 1 refills | Status: DC

## 2016-10-07 MED FILL — LISINOPRIL 5 MG TABLET: 5 | 90 days supply | Qty: 90 | Fill #0

## 2016-10-07 NOTE — Therapy (Signed)
[facility] [address]  [place], [place], [address] Phone: [phone]   Fax:  [phone]  Physical Therapy Treatment  Patient Details  Name: Zachary [name] MRN: [id number] Date of Birth: Dec 20, 1951 Referring Provider: Dr. Corky Mull   Encounter Date: 10/07/2016      PT End of Session - 10/07/16 0930    Visit Number 29   Number of Visits 32   Date for PT Re-Evaluation 10/17/16   PT Start Time 0929   PT Stop Time 1010   PT Time Calculation (min) 41 min   Activity Tolerance Patient tolerated treatment well   Behavior During Therapy [facility] for tasks assessed/performed      Past Medical History:  Diagnosis Date  . Arthritis   . Articular cartilage disease    left shoulder  . Cancer ([place]) 2015   positive prostate cancer bx  . Diverticulitis   . Dyslipidemia (high LDL; low HDL) 08/15/2016  . GERD (gastroesophageal reflux disease)   . Hearing problem    hearing deficit  . Hemorrhoids   . Hypertension   . Hypothyroidism   . Sleep apnea    uses a cpap  . Wears glasses   . Wears hearing aid    both ears    Past Surgical History:  Procedure Laterality Date  . BACK SURGERY  1978   herniated disksurgery  . CARDIAC CATHETERIZATION  08/01/2010  . COLONOSCOPY    . EXAM UNDER ANESTHESIA WITH MANIPULATION OF SHOULDER Right 09/15/2014   Procedure: RIGHT SHOULDER MANIPULATION UNDER ANESTHESIA;  Surgeon: Ninetta Lights, MD;  Location: [place];  Service: Orthopedics;  Laterality: Right;  . HEMORRHOID SURGERY  08/2006  . SHOULDER ARTHROSCOPY WITH DISTAL CLAVICLE RESECTION Left 07/07/2015   Procedure: SHOULDER ARTHROSCOPY WITH DISTAL CLAVICLE RESECTION;  Surgeon: Kathryne Hitch, MD;  Location: [place];  Service: Orthopedics;  Laterality: Left;  . SHOULDER ARTHROSCOPY WITH ROTATOR CUFF REPAIR Left 12/07/2015   Procedure: LEFT SHOULDER ARTHROSCOPY DEBRIDEMENT, WITH ROTATOR CUFF  REPAIR;  Surgeon: Ninetta Lights, MD;  Location: [place];  Service: Orthopedics;  Laterality: Left;  . SHOULDER ARTHROSCOPY WITH ROTATOR CUFF REPAIR AND SUBACROMIAL DECOMPRESSION Left 07/07/2015   Procedure: LEFT SHOULDER SCOPE DEBRIDEMENT, SUBACROMIAL DECOMPRESSION, DISTAL CLAVICULECTOMY, ROTATOR CUFF REPAIR  ;  Surgeon: Kathryne Hitch, MD;  Location: [place];  Service: Orthopedics;  Laterality: Left;  ANESTHESIA: GENERAL, PRE/POST OP SCALENE  . SHOULDER ARTHROSCOPY WITH SUBACROMIAL DECOMPRESSION, ROTATOR CUFF REPAIR AND BICEP TENDON REPAIR Right 03/10/2014   Procedure: RIGHT SHOULDER ARTHROSCOPY WITH SUBACROMIAL DECOMPRESSION, PARTIAL ACROMIOPLASTY WITH CORACOAROMIAL LABRUM DEBRIDEMENT RELEASE DISTAL CLAVICULECTOMY,  ROTATOR CUFF REPAIR AND EXTENSIVE DEBRIDEMENT;  Surgeon: Ninetta Lights, MD;  Location: [place];  Service: Orthopedics;  Laterality: Right;  . TENDON REPAIR  June 06, 2011   right elbow, Dr. Percell Miller    There were no vitals filed for this visit.      Subjective Assessment - 10/07/16 0934    Subjective Pt. reporting he hasn't been sore following the last two treatments.  Pt. reporting MD was happy with progress and wants him to finish current POC and continue band strengthening at home.     Patient Stated Goals return to work and full recovery   Currently in Pain? Yes   Pain Score 1    Pain Location Shoulder   Pain Orientation Left   Pain Descriptors / Indicators Aching   Pain Type  Surgical pain   Pain Onset More than a month ago   Multiple Pain Sites No            [facility] Adult PT Treatment/Exercise - 10/07/16 0936      Shoulder Exercises: Sidelying   External Rotation Left;20 reps;Weights   External Rotation Weight (lbs) 3   ABduction Left;20 reps;Weights   ABduction Weight (lbs) 3     Shoulder Exercises: Standing   External Rotation 20 reps   Theraband Level (Shoulder External Rotation) Level 3 (Green)    External Rotation Limitations 2 sets   Internal Rotation Right;20 reps   Theraband Level (Shoulder Internal Rotation) Level 3 (Green)   Internal Rotation Limitations 2 sets   ABduction Left;Theraband;20 reps   Theraband Level (Shoulder ABduction) Level 1 (Yellow)   ABduction Limitations to 90 degrees; 2 sets   Extension Theraband;20 reps   Theraband Level (Shoulder Extension) Level 3 (Green)   Other Standing Exercises wall ladder with 1# cuff weight and 3 sec hold at end abduction     Shoulder Exercises: ROM/Strengthening   UBE (Upper Arm Bike) L 3.5 x 8 min; 3' fwd/3' bwd   Cybex Row Limitations 45# 2x15 both grips   Wall Pushups 15 reps   Pushups Limitations counter height     Shoulder Exercises: Body Blade   Flexion 45 seconds   ABduction 45 seconds   External Rotation 45 seconds     Manual Therapy   Passive ROM supine external rotation L shoulder in 40, 60, 90 degrees abduction (in scapular plane)                  PT Short Term Goals - 10/03/16 1012      PT SHORT TERM GOAL #1   Title L shoulder PROM Flexion to 120, ER to 60 at 90 ABD, and IR to 45 at 90 ABD by 07/25/16   Status Achieved     PT SHORT TERM GOAL #2   Title improve L shoulder AROM external rotation to at least 70 degrees for improved function (09/19/16)   Status Achieved           PT Long Term Goals - 10/03/16 1012      PT LONG TERM GOAL #1   Title Full PROM in order to progress into Phase II rehab (08/22/16)   Status Achieved     PT LONG TERM GOAL #2   Title L shoulder AROM: flexion 120; abduction at least 90; and ir/er at least 60 without substitution for progression into phase II (08/22/16)   Status Achieved     PT LONG TERM GOAL #3   Title demonstrate 4/5 strength in L shoulder for improved function (10/17/16)   Baseline 10/03/16: flexion and internal rotation met   Status On-going     PT LONG TERM GOAL #4   Title report ability to sleep on L side with pain < 3/10 for improved  function (10/17/16)   Status Achieved               Plan - 10/07/16 0931    Clinical Impression Statement Pt. reporting MD was pleased with L shoulder progress and wants him to finish current POC and continue band strengthening at home following d/c.  Pt. tolerated progression of nearly all therex strengthening activities today well with focus on L shoulder ER and abduction strengthening.  Pt. progressing well.     PT Treatment/Interventions ADLs/Self Care Home Management;Cryotherapy;Electrical Stimulation;Moist Heat;Ultrasound;Patient/family education;Neuromuscular re-education;Therapeutic exercise;Therapeutic activities;Functional mobility training;Manual  techniques;Dry needling;Taping;Passive range of motion;Vasopneumatic Device   PT Next Visit Plan FOTO; continue strengthening; progress slowly      Patient will benefit from skilled therapeutic intervention in order to improve the following deficits and impairments:  Postural dysfunction, Decreased strength, Impaired UE functional use, Pain, Decreased range of motion, Decreased scar mobility  Visit Diagnosis: Stiffness of left shoulder, not elsewhere classified  Left shoulder pain, unspecified chronicity     Problem List Patient Active Problem List   Diagnosis Date Noted  . Dyslipidemia (high LDL; low HDL) 08/15/2016  . Complete rotator cuff tear of left shoulder 07/05/2015  . Tennis elbow 05/30/2014  . Rotator cuff (capsule) sprain 03/10/2014  . Right shoulder pain 02/11/2014  . Memory difficulty 12/18/2012  . Gynecomastia, male 07/30/2012  . OSA (obstructive sleep apnea) 04/23/2011  . EXTERNAL HEMORRHOIDS 02/12/2011  . Prostate cancer ([place]) 11/12/2010  . PLANTAR FASCIITIS 04/09/2010  . Hypothyroidism 12/08/2009  . NEOPLASM OF UNCERTAIN BEHAVIOR OF SKIN 03/10/2009  . OSTEOARTHRITIS 02/14/2009  . HEARING DEFICIT 02/08/2009  . Essential hypertension 02/08/2009  . ALLERGIC RHINITIS 02/08/2009  . GERD 02/08/2009  .  PROTEINURIA 02/08/2009  . DIVERTICULITIS, HX OF 02/08/2009    Bess Harvest, PTA 10/07/16 11:38 AM  [facility] [address]  [place], [place], [address] Phone: [phone]   Fax:  [phone]  Name: Zachary [name] MRN: [id number] Date of Birth: 12-Dec-1951

## 2016-10-08 ENCOUNTER — Other Ambulatory Visit: Payer: Self-pay | Admitting: Sports Medicine

## 2016-10-08 DIAGNOSIS — M25511 Pain in right shoulder: Secondary | ICD-10-CM

## 2016-10-09 DIAGNOSIS — G4733 Obstructive sleep apnea (adult) (pediatric): Secondary | ICD-10-CM | POA: Diagnosis not present

## 2016-10-10 ENCOUNTER — Ambulatory Visit: Payer: 59

## 2016-10-10 DIAGNOSIS — M25612 Stiffness of left shoulder, not elsewhere classified: Secondary | ICD-10-CM | POA: Diagnosis not present

## 2016-10-10 DIAGNOSIS — M25512 Pain in left shoulder: Secondary | ICD-10-CM

## 2016-10-10 NOTE — Therapy (Addendum)
[facility] [address]  [place], [place], [address] Phone: [phone]   Fax:  [phone]  Physical Therapy Treatment  Patient Details  Name: Zachary [name] MRN: [id number] Date of Birth: 1951/11/20 Referring Provider: Dr. Corky Mull   Encounter Date: 10/10/2016      PT End of Session - 10/10/16 0933    Visit Number 30   Number of Visits 32   Date for PT Re-Evaluation 10/17/16   PT Start Time 0932   PT Stop Time 1000   PT Time Calculation (min) 28 min   Activity Tolerance Patient tolerated treatment well   Behavior During Therapy [facility] for tasks assessed/performed      Past Medical History:  Diagnosis Date  . Arthritis   . Articular cartilage disease    left shoulder  . Cancer ([place]) 2015   positive prostate cancer bx  . Diverticulitis   . Dyslipidemia (high LDL; low HDL) 08/15/2016  . GERD (gastroesophageal reflux disease)   . Hearing problem    hearing deficit  . Hemorrhoids   . Hypertension   . Hypothyroidism   . Sleep apnea    uses a cpap  . Wears glasses   . Wears hearing aid    both ears    Past Surgical History:  Procedure Laterality Date  . BACK SURGERY  1978   herniated disksurgery  . CARDIAC CATHETERIZATION  08/01/2010  . COLONOSCOPY    . EXAM UNDER ANESTHESIA WITH MANIPULATION OF SHOULDER Right 09/15/2014   Procedure: RIGHT SHOULDER MANIPULATION UNDER ANESTHESIA;  Surgeon: Ninetta Lights, MD;  Location: [place];  Service: Orthopedics;  Laterality: Right;  . HEMORRHOID SURGERY  08/2006  . SHOULDER ARTHROSCOPY WITH DISTAL CLAVICLE RESECTION Left 07/07/2015   Procedure: SHOULDER ARTHROSCOPY WITH DISTAL CLAVICLE RESECTION;  Surgeon: Kathryne Hitch, MD;  Location: [facility];  Service: Orthopedics;  Laterality: Left;  . SHOULDER ARTHROSCOPY WITH ROTATOR CUFF REPAIR Left 12/07/2015   Procedure: LEFT SHOULDER ARTHROSCOPY DEBRIDEMENT, WITH ROTATOR CUFF  REPAIR;  Surgeon: Ninetta Lights, MD;  Location: [facility];  Service: Orthopedics;  Laterality: Left;  . SHOULDER ARTHROSCOPY WITH ROTATOR CUFF REPAIR AND SUBACROMIAL DECOMPRESSION Left 07/07/2015   Procedure: LEFT SHOULDER SCOPE DEBRIDEMENT, SUBACROMIAL DECOMPRESSION, DISTAL CLAVICULECTOMY, ROTATOR CUFF REPAIR  ;  Surgeon: Kathryne Hitch, MD;  Location: [facility];  Service: Orthopedics;  Laterality: Left;  ANESTHESIA: GENERAL, PRE/POST OP SCALENE  . SHOULDER ARTHROSCOPY WITH SUBACROMIAL DECOMPRESSION, ROTATOR CUFF REPAIR AND BICEP TENDON REPAIR Right 03/10/2014   Procedure: RIGHT SHOULDER ARTHROSCOPY WITH SUBACROMIAL DECOMPRESSION, PARTIAL ACROMIOPLASTY WITH CORACOAROMIAL LABRUM DEBRIDEMENT RELEASE DISTAL CLAVICULECTOMY,  ROTATOR CUFF REPAIR AND EXTENSIVE DEBRIDEMENT;  Surgeon: Ninetta Lights, MD;  Location: [place] [facility];  Service: Orthopedics;  Laterality: Right;  . TENDON REPAIR  June 06, 2011   right elbow, Dr. Percell Miller    There were no vitals filed for this visit.      Subjective Assessment - 10/10/16 0931    Subjective Pt. reporting he wants to d/c today despite having two more visits left in POC.  Pt. reporting he is now sleeping on L side with 2/10 pain level and averages a 1/10 pain level at L shoulder.     Patient Stated Goals return to work and full recovery   Currently in Pain? Yes   Pain Score 1    Pain Location Shoulder   Pain Orientation Left   Pain Descriptors /  Indicators Aching   Pain Type Surgical pain   Pain Onset More than a month ago   Pain Frequency Intermittent   Multiple Pain Sites No            [facility] PT Assessment - 10/10/16 0932      Observation/Other Assessments   Focus on Therapeutic Outcomes (FOTO)  67% (33% limitation)     Strength   Strength Assessment Site Shoulder   Left Shoulder Flexion 4/5   Left Shoulder ABduction 4-/5   Left Shoulder Internal Rotation 4/5   Left Shoulder External Rotation 3+/5       Today's treatment:  Therex:  MMT   Goal testing       PT Education - 10/10/16 1012    Education provided Yes   Education Details scapular retraction issued ot pt. via handout with blue TB    Person(s) Educated Patient   Methods Handout;Explanation;Demonstration;Verbal cues   Comprehension Verbalized understanding;Returned demonstration          PT Short Term Goals - 10/03/16 1012      PT SHORT TERM GOAL #1   Title L shoulder PROM Flexion to 120, ER to 60 at 90 ABD, and IR to 45 at 90 ABD by 07/25/16   Status Achieved     PT SHORT TERM GOAL #2   Title improve L shoulder AROM external rotation to at least 70 degrees for improved function (09/19/16)   Status Achieved           PT Long Term Goals - 10/10/16 0934      PT LONG TERM GOAL #1   Title Full PROM in order to progress into Phase II rehab (08/22/16)    Status Achieved     PT LONG TERM GOAL #2   Title L shoulder AROM: flexion 120; abduction at least 90; and ir/er at least 60 without substitution for progression into phase II (08/22/16)   Status Achieved     PT LONG TERM GOAL #3   Title demonstrate 4/5 strength in L shoulder for improved function (10/17/16)   Baseline 10/03/16: flexion and internal rotation met   Status Partially Met  met with exception of L shoulder abduction 4-/5, L ER 3+/5     PT LONG TERM GOAL #4   Title report ability to sleep on L side with pain < 3/10 for improved function (10/17/16)   Status Achieved               Plan - 10/10/16 0937    Clinical Impression Statement  Pt. requesting d/c today as he approaches end of current POC reporting he feels comfortable transitioning to home program.  Progressed HEP today.  Possibility of d/c discussed with PT; PT agreed to d/c.  Pt. has now met all LTG's with exception of strength goal.  Pt. is still 3+/5 L ER strength and 4-/5 L abduction strength.  He is now sleeping on L side with 3/10 pain at most and reporting an average pain of  1/10 throughout day.    PT Treatment/Interventions ADLs/Self Care Home Management;Cryotherapy;Electrical Stimulation;Moist Heat;Ultrasound;Patient/family education;Neuromuscular re-education;Therapeutic exercise;Therapeutic activities;Functional mobility training;Manual techniques;Dry needling;Taping;Passive range of motion;Vasopneumatic Device   PT Next Visit Plan continue strengthening; progress slowly      Patient will benefit from skilled therapeutic intervention in order to improve the following deficits and impairments:  Postural dysfunction, Decreased strength, Impaired UE functional use, Pain, Decreased range of motion, Decreased scar mobility  Visit Diagnosis: Stiffness of left shoulder, not elsewhere classified  Left shoulder pain, unspecified chronicity     Problem List Patient Active Problem List   Diagnosis Date Noted  . Dyslipidemia (high LDL; low HDL) 08/15/2016  . Complete rotator cuff tear of left shoulder 07/05/2015  . Tennis elbow 05/30/2014  . Rotator cuff (capsule) sprain 03/10/2014  . Right shoulder pain 02/11/2014  . Memory difficulty 12/18/2012  . Gynecomastia, male 07/30/2012  . OSA (obstructive sleep apnea) 04/23/2011  . EXTERNAL HEMORRHOIDS 02/12/2011  . Prostate cancer ([place]) 11/12/2010  . PLANTAR FASCIITIS 04/09/2010  . Hypothyroidism 12/08/2009  . NEOPLASM OF UNCERTAIN BEHAVIOR OF SKIN 03/10/2009  . OSTEOARTHRITIS 02/14/2009  . HEARING DEFICIT 02/08/2009  . Essential hypertension 02/08/2009  . ALLERGIC RHINITIS 02/08/2009  . GERD 02/08/2009  . PROTEINURIA 02/08/2009  . DIVERTICULITIS, HX OF 02/08/2009    Bess Harvest, PTA 10/10/16 10:53 AM  [facility] [address]  [place], [place], [address] Phone: [phone]   Fax:  [phone]  Name: Zachary [name] MRN: [id number] Date of Birth: 05-19-1951     PHYSICAL THERAPY DISCHARGE SUMMARY  Visits from Start of Care:  30  Current functional level related to goals / functional outcomes: See above   Remaining deficits: Pt continues to have external rotation and abduction strength deficits consistent with massive RTC repair.  Otherwise all goals met.   Education / Equipment: HEP  Plan: Patient agrees to discharge.  Patient goals were partially met. Patient is being discharged due to being pleased with the current functional level.  ?????     Pt requested d/c at this time as he feels comfortable progressing HEP at home.  Laureen Abrahams, PT, DPT 10/10/16 11:08 AM  [facility] at [facility] [place], [place] [address]  [phone] (office) [phone] (fax)

## 2016-10-14 ENCOUNTER — Ambulatory Visit (INDEPENDENT_AMBULATORY_CARE_PROVIDER_SITE_OTHER): Payer: 59

## 2016-10-14 DIAGNOSIS — S46011A Strain of muscle(s) and tendon(s) of the rotator cuff of right shoulder, initial encounter: Secondary | ICD-10-CM | POA: Diagnosis not present

## 2016-10-14 DIAGNOSIS — S46811A Strain of other muscles, fascia and tendons at shoulder and upper arm level, right arm, initial encounter: Secondary | ICD-10-CM

## 2016-10-14 DIAGNOSIS — M7581 Other shoulder lesions, right shoulder: Secondary | ICD-10-CM | POA: Diagnosis not present

## 2016-10-14 DIAGNOSIS — M25511 Pain in right shoulder: Secondary | ICD-10-CM

## 2016-10-14 DIAGNOSIS — M7551 Bursitis of right shoulder: Secondary | ICD-10-CM | POA: Diagnosis not present

## 2016-10-14 DIAGNOSIS — X58XXXA Exposure to other specified factors, initial encounter: Secondary | ICD-10-CM | POA: Diagnosis not present

## 2016-10-14 DIAGNOSIS — F6810 Factitious disorder, unspecified: Secondary | ICD-10-CM | POA: Diagnosis not present

## 2016-10-14 DIAGNOSIS — M25521 Pain in right elbow: Secondary | ICD-10-CM

## 2016-10-23 ENCOUNTER — Ambulatory Visit (AMBULATORY_SURGERY_CENTER): Payer: Self-pay | Admitting: *Deleted

## 2016-10-23 VITALS — Ht 68.0 in | Wt 203.0 lb

## 2016-10-23 DIAGNOSIS — Z1211 Encounter for screening for malignant neoplasm of colon: Secondary | ICD-10-CM

## 2016-10-23 MED ORDER — NA SULFATE-K SULFATE-MG SULF 17.5-3.13-1.6 GM/177ML PO SOLN: ORAL | 0 refills | Status: DC

## 2016-10-23 MED FILL — SUPREP BOWEL PREP KIT: 17.5-3.13-1 | 1 days supply | Qty: 354 | Fill #0

## 2016-10-23 NOTE — Progress Notes (Signed)
Patient denies any allergies to eggs or soy. Patient denies any problems with anesthesia/sedation. Patient denies any oxygen use at home and does not take any diet/weight loss medications. Pt declined EMMI education. 

## 2016-10-31 DIAGNOSIS — G8929 Other chronic pain: Secondary | ICD-10-CM | POA: Diagnosis not present

## 2016-10-31 DIAGNOSIS — Z95818 Presence of other cardiac implants and grafts: Secondary | ICD-10-CM | POA: Diagnosis not present

## 2016-10-31 DIAGNOSIS — M25511 Pain in right shoulder: Secondary | ICD-10-CM | POA: Diagnosis not present

## 2016-10-31 DIAGNOSIS — I10 Essential (primary) hypertension: Secondary | ICD-10-CM | POA: Diagnosis not present

## 2016-10-31 DIAGNOSIS — M7541 Impingement syndrome of right shoulder: Secondary | ICD-10-CM | POA: Diagnosis not present

## 2016-10-31 DIAGNOSIS — E785 Hyperlipidemia, unspecified: Secondary | ICD-10-CM | POA: Diagnosis not present

## 2016-10-31 DIAGNOSIS — E039 Hypothyroidism, unspecified: Secondary | ICD-10-CM | POA: Diagnosis not present

## 2016-10-31 DIAGNOSIS — M7542 Impingement syndrome of left shoulder: Secondary | ICD-10-CM | POA: Diagnosis not present

## 2016-11-06 ENCOUNTER — Ambulatory Visit (AMBULATORY_SURGERY_CENTER): Payer: 59 | Admitting: Internal Medicine

## 2016-11-06 ENCOUNTER — Encounter: Payer: Self-pay | Admitting: Internal Medicine

## 2016-11-06 VITALS — BP 111/75 | HR 60 | Temp 99.3°F | Resp 20 | Ht 68.0 in | Wt 203.0 lb

## 2016-11-06 DIAGNOSIS — Z1212 Encounter for screening for malignant neoplasm of rectum: Secondary | ICD-10-CM

## 2016-11-06 DIAGNOSIS — Z1211 Encounter for screening for malignant neoplasm of colon: Secondary | ICD-10-CM | POA: Diagnosis present

## 2016-11-06 DIAGNOSIS — D123 Benign neoplasm of transverse colon: Secondary | ICD-10-CM | POA: Diagnosis not present

## 2016-11-06 DIAGNOSIS — K649 Unspecified hemorrhoids: Secondary | ICD-10-CM

## 2016-11-06 MED ORDER — HYDROCORTISONE 2.5 % RE CREA: 1.0000 "application " | TOPICAL_CREAM | Freq: Every evening | RECTAL | 6 refills | Status: DC | PRN

## 2016-11-06 MED ORDER — SODIUM CHLORIDE 0.9 % IV SOLN: 500.0000 mL | INTRAVENOUS | Status: DC

## 2016-11-06 MED FILL — HYDROCORTISONE 2.5% CREAM: 2.5 | 30 days supply | Qty: 30 | Fill #0

## 2016-11-06 NOTE — Patient Instructions (Signed)
Impression/Recommendations:  Polyp handout given to patient. Diverticulosis handout given to patient. Hemorrhoid handout given to patient.  Repeat colonoscopy in 5-10 years for surveillance.  YOU HAD AN ENDOSCOPIC PROCEDURE TODAY AT [facility]:   Refer to the procedure report that was given to you for any specific questions about what was found during the examination.  If the procedure report does not answer your questions, please call your gastroenterologist to clarify.  If you requested that your care partner not be given the details of your procedure findings, then the procedure report has been included in a sealed envelope for you to review at your convenience later.  YOU SHOULD EXPECT: Some feelings of bloating in the abdomen. Passage of more gas than usual.  Walking can help get rid of the air that was put into your GI tract during the procedure and reduce the bloating. If you had a lower endoscopy (such as a colonoscopy or flexible sigmoidoscopy) you may notice spotting of blood in your stool or on the toilet paper. If you underwent a bowel prep for your procedure, you may not have a normal bowel movement for a few days.  Please Note:  You might notice some irritation and congestion in your nose or some drainage.  This is from the oxygen used during your procedure.  There is no need for concern and it should clear up in a day or so.  SYMPTOMS TO REPORT IMMEDIATELY:   Following lower endoscopy (colonoscopy or flexible sigmoidoscopy):  Excessive amounts of blood in the stool  Significant tenderness or worsening of abdominal pains  Swelling of the abdomen that is new, acute  Fever of 100F or higher  For urgent or emergent issues, a gastroenterologist can be reached at any hour by calling [phone].   DIET:  We do recommend a small meal at first, but then you may proceed to your regular diet.  Drink plenty of fluids but you should avoid alcoholic beverages for 24  hours.  ACTIVITY:  You should plan to take it easy for the rest of today and you should NOT DRIVE or use heavy machinery until tomorrow (because of the sedation medicines used during the test).    FOLLOW UP: Our staff will call the number listed on your records the next business day following your procedure to check on you and address any questions or concerns that you may have regarding the information given to you following your procedure. If we do not reach you, we will leave a message.  However, if you are feeling well and you are not experiencing any problems, there is no need to return our call.  We will assume that you have returned to your regular daily activities without incident.  If any biopsies were taken you will be contacted by phone or by letter within the next 1-3 weeks.  Please call us at [phone] if you have not heard about the biopsies in 3 weeks.    SIGNATURES/CONFIDENTIALITY: You and/or your care partner have signed paperwork which will be entered into your electronic medical record.  These signatures attest to the fact that that the information above on your After Visit Summary has been reviewed and is understood.  Full responsibility of the confidentiality of this discharge information lies with you and/or your care-partner.

## 2016-11-06 NOTE — Op Note (Signed)
[facility] Patient Name: Karo Leinonen Procedure Date: 11/06/2016 9:46 AM MRN: [id number] Endoscopist: Docia Chuck. Henrene Pastor , MD Age: 65 Referring MD:  Date of Birth: September 13, 1951 Gender: Male Account #: [id number] Procedure:                Colonoscopy, with cold snare polypectomy x 1 Indications:              Screening for colorectal malignant neoplasm.                            Negative index exam June 2007 ([place]) Medicines:                Monitored Anesthesia Care Procedure:                Pre-Anesthesia Assessment:                           - Prior to the procedure, a History and Physical                            was performed, and patient medications and                            allergies were reviewed. The patient's tolerance of                            previous anesthesia was also reviewed. The risks                            and benefits of the procedure and the sedation                            options and risks were discussed with the patient.                            All questions were answered, and informed consent                            was obtained. Prior Anticoagulants: The patient has                            taken no previous anticoagulant or antiplatelet                            agents. ASA Grade Assessment: II - A patient with                            mild systemic disease. After reviewing the risks                            and benefits, the patient was deemed in  satisfactory condition to undergo the procedure.                           After obtaining informed consent, the colonoscope                            was passed under direct vision. Throughout the                            procedure, the patient's blood pressure, pulse, and                            oxygen saturations were monitored continuously. The                            Model CF-HQ190L  [id number]) scope was introduced                            through the anus and advanced to the the cecum,                            identified by appendiceal orifice and ileocecal                            valve. The ileocecal valve, appendiceal orifice,                            and rectum were photographed. The quality of the                            bowel preparation was excellent. The colonoscopy                            was performed without difficulty. The patient                            tolerated the procedure well. The bowel preparation                            used was SUPREP. Scope In: 9:56:11 AM Scope Out: 10:07:53 AM Scope Withdrawal Time: 0 hours 10 minutes 13 seconds  Total Procedure Duration: 0 hours 11 minutes 42 seconds  Findings:                 A 2 mm polyp was found in the proximal transverse                            colon. The polyp was removed with a cold snare.                            Resection and retrieval were complete.                           Multiple diverticula were found in the sigmoid  colon.                           Internal hemorrhoids were found during retroflexion.                           The exam was otherwise without abnormality on                            direct and retroflexion views. Complications:            No immediate complications. Estimated blood loss:                            None. Estimated Blood Loss:     Estimated blood loss: none. Impression:               - One 2 mm polyp in the proximal transverse colon,                            removed with a cold snare. Resected and retrieved.                           - Diverticulosis in the sigmoid colon.                           - Internal hemorrhoids.                           - The examination was otherwise normal on direct                            and retroflexion views. Recommendation:           - Repeat colonoscopy in 5-10 years for  surveillance.                           - Patient has a contact number available for                            emergencies. The signs and symptoms of potential                            delayed complications were discussed with the                            patient. Return to normal activities tomorrow.                            Written discharge instructions were provided to the                            patient.                           - Resume previous diet.                           -  Continue present medications.                           - Await pathology results. Docia Chuck. Henrene Pastor, MD 11/06/2016 10:11:20 AM This report has been signed electronically.

## 2016-11-06 NOTE — Progress Notes (Signed)
Called to room to assist during endoscopic procedure.  Patient ID and intended procedure confirmed with present staff. Received instructions for my participation in the procedure from the performing physician.  

## 2016-11-06 NOTE — Progress Notes (Signed)
A and O x3. Report to RN. Tolerated MAC anesthesia well. 

## 2016-11-07 ENCOUNTER — Telehealth: Payer: Self-pay | Admitting: *Deleted

## 2016-11-07 NOTE — Telephone Encounter (Signed)
  Follow up Call-  Call back number 11/06/2016  Post procedure Call Back phone  # [phone]  Permission to leave phone message Yes  Some recent data might be hidden     Patient questions:  Do you have a fever, pain , or abdominal swelling? No. Pain Score  0 *  Have you tolerated food without any problems? Yes.    Have you been able to return to your normal activities? Yes.    Do you have any questions about your discharge instructions: Diet   No. Medications  No. Follow up visit  No.  Do you have questions or concerns about your Care? No.  Actions: * If pain score is 4 or above: No action needed, pain <4.

## 2016-11-11 ENCOUNTER — Encounter: Payer: Self-pay | Admitting: Internal Medicine

## 2016-11-22 ENCOUNTER — Encounter: Payer: Self-pay | Admitting: Family Medicine

## 2016-11-22 ENCOUNTER — Telehealth: Payer: Self-pay

## 2016-11-22 MED ORDER — SODIUM BICARBONATE 650 MG PO TABS: 650.0000 mg | ORAL_TABLET | Freq: Four times a day (QID) | ORAL | 1 refills | Status: DC

## 2016-11-22 MED FILL — SODIUM BICARB 10 GRAIN TAB: 650 | 90 days supply | Qty: 360 | Fill #0

## 2016-11-22 NOTE — Telephone Encounter (Signed)
Per Approval from Dr. Nani Ravens ok to refill Rx for Sodium Bicarbonate. #360 tabletswith #1 refill. TL/CMA

## 2016-11-22 NOTE — Telephone Encounter (Signed)
Received request for refill on Sodium Carbonate. I have reached out to patient who informed me that he is requesting medication dueto Dr. Ileene Rubens leaving and him coming to Dr. Nani Ravens. I did inform patient he might have to be evaluated before refill approved. Please advise for further recommendation. TL/CMA

## 2016-11-22 NOTE — Addendum Note (Signed)
Addended by: Frederick Cellar on: 11/22/2016 04:44 PM   Modules accepted: Orders

## 2016-11-25 ENCOUNTER — Other Ambulatory Visit: Payer: Self-pay | Admitting: Family Medicine

## 2016-11-25 DIAGNOSIS — K219 Gastro-esophageal reflux disease without esophagitis: Secondary | ICD-10-CM

## 2016-11-29 DIAGNOSIS — M19011 Primary osteoarthritis, right shoulder: Secondary | ICD-10-CM | POA: Diagnosis not present

## 2016-11-29 DIAGNOSIS — M75121 Complete rotator cuff tear or rupture of right shoulder, not specified as traumatic: Secondary | ICD-10-CM | POA: Diagnosis not present

## 2016-11-29 DIAGNOSIS — M94212 Chondromalacia, left shoulder: Secondary | ICD-10-CM | POA: Diagnosis not present

## 2016-11-29 DIAGNOSIS — M7582 Other shoulder lesions, left shoulder: Secondary | ICD-10-CM | POA: Diagnosis not present

## 2016-11-29 DIAGNOSIS — M19012 Primary osteoarthritis, left shoulder: Secondary | ICD-10-CM | POA: Diagnosis not present

## 2016-11-29 DIAGNOSIS — M75111 Incomplete rotator cuff tear or rupture of right shoulder, not specified as traumatic: Secondary | ICD-10-CM | POA: Diagnosis not present

## 2016-11-29 DIAGNOSIS — M85622 Other cyst of bone, left upper arm: Secondary | ICD-10-CM | POA: Diagnosis not present

## 2016-11-29 DIAGNOSIS — I10 Essential (primary) hypertension: Secondary | ICD-10-CM | POA: Diagnosis not present

## 2016-11-29 DIAGNOSIS — G8918 Other acute postprocedural pain: Secondary | ICD-10-CM | POA: Diagnosis not present

## 2016-11-29 DIAGNOSIS — M7541 Impingement syndrome of right shoulder: Secondary | ICD-10-CM | POA: Diagnosis not present

## 2016-11-29 DIAGNOSIS — M85611 Other cyst of bone, right shoulder: Secondary | ICD-10-CM | POA: Diagnosis not present

## 2016-11-29 DIAGNOSIS — T84120A Displacement of internal fixation device of right humerus, initial encounter: Secondary | ICD-10-CM | POA: Diagnosis not present

## 2016-11-29 DIAGNOSIS — E785 Hyperlipidemia, unspecified: Secondary | ICD-10-CM | POA: Diagnosis not present

## 2016-11-29 DIAGNOSIS — M75122 Complete rotator cuff tear or rupture of left shoulder, not specified as traumatic: Secondary | ICD-10-CM | POA: Diagnosis not present

## 2016-11-29 DIAGNOSIS — M7542 Impingement syndrome of left shoulder: Secondary | ICD-10-CM | POA: Diagnosis not present

## 2016-11-29 DIAGNOSIS — E039 Hypothyroidism, unspecified: Secondary | ICD-10-CM | POA: Diagnosis not present

## 2016-12-02 MED FILL — PROMETHAZINE 12.5 MG TABLET: 12.5 | 3 days supply | Qty: 10 | Fill #0

## 2016-12-02 MED FILL — HYDROmorphone HCL 2 MG TABS: 2 | 8 days supply | Qty: 30 | Fill #0

## 2016-12-02 MED FILL — HYDROCODON-APAP 5-325: 5-325 | 3 days supply | Qty: 30 | Fill #0

## 2016-12-11 ENCOUNTER — Ambulatory Visit: Payer: Self-pay | Admitting: Family Medicine

## 2016-12-11 MED FILL — PRAVASTATIN NA 40 MG TAB: 40 | 90 days supply | Qty: 45 | Fill #2

## 2016-12-12 ENCOUNTER — Encounter: Payer: Self-pay | Admitting: Family Medicine

## 2016-12-12 ENCOUNTER — Ambulatory Visit (INDEPENDENT_AMBULATORY_CARE_PROVIDER_SITE_OTHER): Payer: 59 | Admitting: Family Medicine

## 2016-12-12 VITALS — BP 128/87 | HR 79 | Temp 98.3°F | Resp 16 | Ht 68.0 in | Wt 196.4 lb

## 2016-12-12 DIAGNOSIS — L918 Other hypertrophic disorders of the skin: Secondary | ICD-10-CM

## 2016-12-12 NOTE — Progress Notes (Signed)
Chief Complaint  Patient presents with  . Skin Tags   Pt is here for possible skin tag removal. He has numerous skin tags, but they are painful on his neck and forehead. He has had them removed before and stated that insurance was not an issue in the past.   ROS: Skin- +skin tags  Past Medical History:  Diagnosis Date  . Arthritis   . Articular cartilage disease    left shoulder  . Cancer ([place]) 2015   positive prostate cancer bx  . Diverticulitis   . Dyslipidemia (high LDL; low HDL) 08/15/2016  . GERD (gastroesophageal reflux disease)   . Hearing problem    hearing deficit  . Hemorrhoids   . Hypertension   . Hypothyroidism   . Sleep apnea    uses a cpap  . Wears glasses   . Wears hearing aid    both ears   BP 128/87 (BP Location: Left Arm, Patient Position: Sitting, Cuff Size: Large)   Pulse 79   Temp 98.3 F (36.8 C) (Oral)   Resp 16   Ht 5\' 8"  (1.727 m)   Wt 196 lb 6.4 oz (89.1 kg)   SpO2 95% Comment: RA  BMI 29.86 kg/m  Gen- awake, alert Psych- age appropriate judgment and insight, nml mood and affect Skin- there is an inflamed skin tag on his L anterior neck and a similarly painful lesion on R side of forehead near hairline.  Procedure note; skin tag removal Informed consent obtained. The smaller skin tag on the neck was cleaned with alcohol and removed with iris scissors. The one on the forehead was anesthetized with 1% lidocaine with epinephrine after being cleaned with alcohol. The larger skin tags was grasped with a pickups and then removed with iris scissors. Hemostasis was obtained. Bandage with triple antibiotic ointment placed. There were no complications noted. The patient tolerated the procedure well.  Acrochordon - Plan: PR DESTRUCTION BENIGN LESIONS UP TO 14  Orders as above. No showering for rest of day.  OK to keep abx ointment, applying twice daily.  Warning signs for infection discussed. F/u prn. Pt and his wife voiced understanding and  agreement to the plan.  Ha[place], DO 12/13/16 7:14 AM

## 2016-12-12 NOTE — Progress Notes (Signed)
Pre visit review using our clinic review tool, if applicable. No additional management support is needed unless otherwise documented below in the visit note. 

## 2016-12-30 ENCOUNTER — Ambulatory Visit: Payer: 59 | Attending: Sports Medicine | Admitting: Physical Therapy

## 2016-12-30 DIAGNOSIS — M25511 Pain in right shoulder: Secondary | ICD-10-CM | POA: Insufficient documentation

## 2016-12-30 DIAGNOSIS — M6281 Muscle weakness (generalized): Secondary | ICD-10-CM | POA: Insufficient documentation

## 2016-12-30 DIAGNOSIS — M25611 Stiffness of right shoulder, not elsewhere classified: Secondary | ICD-10-CM | POA: Insufficient documentation

## 2016-12-30 DIAGNOSIS — R293 Abnormal posture: Secondary | ICD-10-CM | POA: Diagnosis not present

## 2016-12-30 NOTE — Therapy (Signed)
[facility] [address]  [place], [place], [address] Phone: [phone]   Fax:  [phone]  Physical Therapy Evaluation  Patient Details  Name: Zachary [name] MRN: [id number] Date of Birth: 20-Apr-1951 Referring Provider: Dr. Corky Mull  Encounter Date: 12/30/2016      PT End of Session - 12/30/16 0910    Visit Number 1   Number of Visits 24   Date for PT Re-Evaluation 03/28/17   PT Start Time 0800   PT Stop Time 0855   PT Time Calculation (min) 55 min   Activity Tolerance Patient limited by pain   Behavior During Therapy [facility] for tasks assessed/performed      Past Medical History:  Diagnosis Date  . Arthritis   . Articular cartilage disease    left shoulder  . Cancer ([place]) 2015   positive prostate cancer bx  . Diverticulitis   . Dyslipidemia (high LDL; low HDL) 08/15/2016  . GERD (gastroesophageal reflux disease)   . Hearing problem    hearing deficit  . Hemorrhoids   . Hypertension   . Hypothyroidism   . Sleep apnea    uses a cpap  . Wears glasses   . Wears hearing aid    both ears    Past Surgical History:  Procedure Laterality Date  . BACK SURGERY  1978   herniated disksurgery  . CARDIAC CATHETERIZATION  08/01/2010  . COLONOSCOPY    . EXAM UNDER ANESTHESIA WITH MANIPULATION OF SHOULDER Right 09/15/2014   Procedure: RIGHT SHOULDER MANIPULATION UNDER ANESTHESIA;  Surgeon: Ninetta Lights, MD;  Location: [place];  Service: Orthopedics;  Laterality: Right;  . HEMORRHOID SURGERY  08/2006  . SHOULDER ARTHROSCOPY WITH DISTAL CLAVICLE RESECTION Left 07/07/2015   Procedure: SHOULDER ARTHROSCOPY WITH DISTAL CLAVICLE RESECTION;  Surgeon: Kathryne Hitch, MD;  Location: [place];  Service: Orthopedics;  Laterality: Left;  . SHOULDER ARTHROSCOPY WITH ROTATOR CUFF REPAIR Left 12/07/2015   Procedure: LEFT SHOULDER ARTHROSCOPY DEBRIDEMENT, WITH ROTATOR CUFF REPAIR;   Surgeon: Ninetta Lights, MD;  Location: [place];  Service: Orthopedics;  Laterality: Left;  . SHOULDER ARTHROSCOPY WITH ROTATOR CUFF REPAIR AND SUBACROMIAL DECOMPRESSION Left 07/07/2015   Procedure: LEFT SHOULDER SCOPE DEBRIDEMENT, SUBACROMIAL DECOMPRESSION, DISTAL CLAVICULECTOMY, ROTATOR CUFF REPAIR  ;  Surgeon: Kathryne Hitch, MD;  Location: [place];  Service: Orthopedics;  Laterality: Left;  ANESTHESIA: GENERAL, PRE/POST OP SCALENE  . SHOULDER ARTHROSCOPY WITH SUBACROMIAL DECOMPRESSION, ROTATOR CUFF REPAIR AND BICEP TENDON REPAIR Right 03/10/2014   Procedure: RIGHT SHOULDER ARTHROSCOPY WITH SUBACROMIAL DECOMPRESSION, PARTIAL ACROMIOPLASTY WITH CORACOAROMIAL LABRUM DEBRIDEMENT RELEASE DISTAL CLAVICULECTOMY,  ROTATOR CUFF REPAIR AND EXTENSIVE DEBRIDEMENT;  Surgeon: Ninetta Lights, MD;  Location: [place];  Service: Orthopedics;  Laterality: Right;  . TENDON REPAIR  June 06, 2011   right elbow, Dr. Percell Miller    There were no vitals filed for this visit.       Subjective Assessment - 12/30/16 0806    Subjective During rehab for pt's most recent L RTC repair pt started noticing R shoulder pain. MRI performed on 10/14/16 revealed retear of RTC. Pt had surgery with open approach on 11/29/16 to repair the R RTC, remove a cyst, and perform a bone graft. Pt will return to Dr. Robby Sermon on Friday 01/11/2016 with hopes to return to light duty, which typically includes computer work. Pt has been performing wrist, elbow, and forearm ROM as well as grip  strengthening.    Diagnostic tests MRI 10/14/16 R shoulder: 1. Severe tendinosis of the supraspinatus tendon with a high-grade partial-thickness articular surface tear with possible small full-thickness component. 2. Mild tendinosis of the infraspinatus tendon with a small interstitial tear. 3. Mild subacromial/subdeltoid bursitis.   Patient Stated Goals "get back to work"    Currently in Pain? Yes   Pain Score 0-No  pain  worst: 7/10 best: 0/10 avg: 3-4/10   Pain Location Shoulder   Pain Orientation Right;Upper   Pain Descriptors / Indicators Sharp   Pain Type Surgical pain;Acute pain   Pain Radiating Towards goes down the arm to the elbow, different type of pain. when trying to get cofortable to sleep   Pain Onset 1 to 4 weeks ago   Pain Frequency Intermittent   Aggravating Factors  sleeping   Pain Relieving Factors tylenol, dillaudid   Effect of Pain on Daily Activities annoying, more limited by the protocol/restrictions than the pain itself            [facility] PT Assessment - 12/30/16 0816      Assessment   Medical Diagnosis R RTC Repair with bone cyst removal & bone graft of humeral head   Referring Provider Dr. Corky Mull   Onset Date/Surgical Date 11/29/16   Hand Dominance Right   Next MD Visit 12/10/2017   Prior Therapy two previous surgeries on the shoulder with therapy post op. L RTC with therapy post op.     Precautions   Precautions Shoulder   Type of Shoulder Precautions per RTC protocol; no lifting, pushing, or pulling right upper extremity.     Balance Screen   Has the patient fallen in the past 6 months No   Has the patient had a decrease in activity level because of a fear of falling?  No   Is the patient reluctant to leave their home because of a fear of falling?  No     Home Environment   Living Environment Private residence   Living Arrangements Spouse/significant other;Children   Available Help at Discharge Family   Type of [place] Access Level entry   [place] Two level     Prior Function   Level of [place] Full time employment   Vocation Requirements Full Duty: warehouse. lifting, pulling, pushing, computer work   Leisure yard work, Architect working     Observation/Other Assessments   Focus on Therapeutic Outcomes (FOTO)  Intake 36% (64% limitation); Predicted 63% (37% limitation)     Posture/Postural Control    Posture/Postural Control Postural limitations   Postural Limitations Rounded Shoulders;Forward head   Posture Comments Right shoulder appears higher than the left shoulder when seated     ROM / Strength   AROM / PROM / Strength PROM     PROM   Right Shoulder Flexion 93 Degrees   Right Shoulder External Rotation 24 Degrees  ER at side   Right Shoulder Horizontal ABduction 45 Degrees  ABD without rotation                   [facility] Adult PT Treatment/Exercise - 12/30/16 0816      Exercises   Exercises Shoulder     Shoulder Exercises: ROM/Strengthening   Pendulum Instructed pt in flex/ext & Horizontal Abduction & adduction but was unable to relax     Modalities   Modalities Vasopneumatic     Vasopneumatic   Number Minutes Vasopneumatic  15 minutes  Vasopnuematic Location  Shoulder   Vasopneumatic Pressure Low   Vasopneumatic Temperature  Coldest temperature     Manual Therapy   Manual Therapy Passive ROM   Passive ROM R shoulder flexion, ER at side, and ABduction without rotation to pt tolerance and within protocol restrictions.                PT Education - 12/30/16 0903    Education provided Yes   Education Details PT eval findings & POC   Person(s) Educated Patient   Methods Explanation   Comprehension Verbalized understanding          PT Short Term Goals - 12/30/16 0937      PT SHORT TERM GOAL #1   Title R shoulder PROM forward flexion to 140, ER at side to 40, & ABD without rotation to 80 by 01/24/17    Status New     PT SHORT TERM GOAL #2   Title independent with basic HEP by 01/17/17   Status New           PT Long Term Goals - 12/30/16 0946      PT LONG TERM GOAL #1   Title R shoulder AROM WFL by 03/28/17   Time --   Period --   Status New     PT LONG TERM GOAL #2   Title pt is able to return to all ADLs, chores, and work duties without limitation by shoulder pain, LOM, or weakness by 03/28/17.   Status New     PT LONG TERM  GOAL #3   Title pt able to sleep without limitation by shoulder pain by 03/28/17   Status New     PT LONG TERM GOAL #4   Title R shoulder MMT >/= to 4/5 by 03/28/17   Status New               Plan - 12/30/16 0913    Clinical Impression Statement Ambrocio is a 66 year old male reporting to therapy today following a Right RTC repair with removal of cyst and bone graft. Date of surgery was 11/29/2016. pt has had previous R & L RTC repair with therapy following each surgery. pt reports intermittent pain with most occuring at night. pt was able to tolerate passive flexion, abduction, and ER at side with minimal pain once able to relax. Instructed pt on pendulum swings but was unable to fully relax. POC will focus on RTC protocol provided by referring physician.   Rehab Potential Good   Clinical Impairments Affecting Rehab Potential history of multiple bilateral RTC surgeries; history of R elbow surgery   PT Frequency 2x / week   PT Duration 12 weeks   PT Treatment/Interventions Patient/family education;ADLs/Self Care Home Management;Passive range of motion;Manual techniques;Therapeutic exercise;Therapeutic activities;Cryotherapy;Vasopneumatic Device;Moist Heat;Electrical Stimulation;Scar mobilization   PT Next Visit Plan continue PROM of FF, ER at side, ABD; heat/ice PRN; review pendulum exercises   Consulted and Agree with Plan of Care Patient      Patient will benefit from skilled therapeutic intervention in order to improve the following deficits and impairments:  Decreased range of motion, Decreased strength, Pain, Impaired UE functional use, Decreased activity tolerance, Decreased scar mobility, Postural dysfunction  Visit Diagnosis: Right shoulder pain, unspecified chronicity  Stiffness of right shoulder, not elsewhere classified  Abnormal posture  Muscle weakness (generalized)     Problem List Patient Active Problem List   Diagnosis Date Noted  . Dyslipidemia (high LDL; low  HDL) 08/15/2016  .  Complete rotator cuff tear of left shoulder 07/05/2015  . Tennis elbow 05/30/2014  . Rotator cuff (capsule) sprain 03/10/2014  . Right shoulder pain 02/11/2014  . Memory difficulty 12/18/2012  . Gynecomastia, male 07/30/2012  . OSA (obstructive sleep apnea) 04/23/2011  . EXTERNAL HEMORRHOIDS 02/12/2011  . Prostate cancer ([place]) 11/12/2010  . PLANTAR FASCIITIS 04/09/2010  . Hypothyroidism 12/08/2009  . NEOPLASM OF UNCERTAIN BEHAVIOR OF SKIN 03/10/2009  . OSTEOARTHRITIS 02/14/2009  . HEARING DEFICIT 02/08/2009  . Essential hypertension 02/08/2009  . ALLERGIC RHINITIS 02/08/2009  . GERD 02/08/2009  . PROTEINURIA 02/08/2009  . DIVERTICULITIS, HX OF 02/08/2009    Lauralee Evener, SPT 12/30/2016, 3:17 PM  [facility] [address]  [place], [place], [address] Phone: [phone]   Fax:  [phone]  Name: KAMRY STIKA MRN: [id number] Date of Birth: Sep 11, 1951  Read, reviewed, edited and agree with student's findings and recommendations.  This entire session was guided, instructed, and directly supervised by   Percival Spanish, PT, MPT 12/30/16, 3:17 PM  [facility] [address]  [place], [place], [address] Phone: [phone]   Fax:  [phone]

## 2017-01-01 MED FILL — LEVOTHYROXINE 100 MCG TAB: 100 | 90 days supply | Qty: 90 | Fill #1

## 2017-01-01 MED FILL — OMEPRAZOLE DR 40 MG CAPSULE: 40 | 60 days supply | Qty: 60 | Fill #2

## 2017-01-02 ENCOUNTER — Ambulatory Visit: Payer: 59 | Admitting: Physical Therapy

## 2017-01-02 DIAGNOSIS — M6281 Muscle weakness (generalized): Secondary | ICD-10-CM | POA: Diagnosis not present

## 2017-01-02 DIAGNOSIS — R293 Abnormal posture: Secondary | ICD-10-CM | POA: Diagnosis not present

## 2017-01-02 DIAGNOSIS — M25511 Pain in right shoulder: Secondary | ICD-10-CM | POA: Diagnosis not present

## 2017-01-02 DIAGNOSIS — M25611 Stiffness of right shoulder, not elsewhere classified: Secondary | ICD-10-CM

## 2017-01-02 NOTE — Therapy (Signed)
[facility] [address]  [place], [place], [address] Phone: [phone]   Fax:  [phone]  Physical Therapy Treatment  Patient Details  Name: Zachary [name] MRN: [id number] Date of Birth: Mar 22, 1951 Referring Provider: Dr. Corky Mull  Encounter Date: 01/02/2017      PT End of Session - 01/02/17 0800    Visit Number 2   Number of Visits 24   Date for PT Re-Evaluation 03/28/17   PT Start Time 0800   PT Stop Time 0853   PT Time Calculation (min) 53 min   Activity Tolerance Patient tolerated treatment well   Behavior During Therapy [facility] for tasks assessed/performed      Past Medical History:  Diagnosis Date  . Arthritis   . Articular cartilage disease    left shoulder  . Cancer ([place]) 2015   positive prostate cancer bx  . Diverticulitis   . Dyslipidemia (high LDL; low HDL) 08/15/2016  . GERD (gastroesophageal reflux disease)   . Hearing problem    hearing deficit  . Hemorrhoids   . Hypertension   . Hypothyroidism   . Sleep apnea    uses a cpap  . Wears glasses   . Wears hearing aid    both ears    Past Surgical History:  Procedure Laterality Date  . BACK SURGERY  1978   herniated disksurgery  . CARDIAC CATHETERIZATION  08/01/2010  . COLONOSCOPY    . EXAM UNDER ANESTHESIA WITH MANIPULATION OF SHOULDER Right 09/15/2014   Procedure: RIGHT SHOULDER MANIPULATION UNDER ANESTHESIA;  Surgeon: Ninetta Lights, MD;  Location: [place];  Service: Orthopedics;  Laterality: Right;  . HEMORRHOID SURGERY  08/2006  . SHOULDER ARTHROSCOPY WITH DISTAL CLAVICLE RESECTION Left 07/07/2015   Procedure: SHOULDER ARTHROSCOPY WITH DISTAL CLAVICLE RESECTION;  Surgeon: Kathryne Hitch, MD;  Location: [place];  Service: Orthopedics;  Laterality: Left;  . SHOULDER ARTHROSCOPY WITH ROTATOR CUFF REPAIR Left 12/07/2015   Procedure: LEFT SHOULDER ARTHROSCOPY DEBRIDEMENT, WITH ROTATOR CUFF  REPAIR;  Surgeon: Ninetta Lights, MD;  Location: [place];  Service: Orthopedics;  Laterality: Left;  . SHOULDER ARTHROSCOPY WITH ROTATOR CUFF REPAIR AND SUBACROMIAL DECOMPRESSION Left 07/07/2015   Procedure: LEFT SHOULDER SCOPE DEBRIDEMENT, SUBACROMIAL DECOMPRESSION, DISTAL CLAVICULECTOMY, ROTATOR CUFF REPAIR  ;  Surgeon: Kathryne Hitch, MD;  Location: [place];  Service: Orthopedics;  Laterality: Left;  ANESTHESIA: GENERAL, PRE/POST OP SCALENE  . SHOULDER ARTHROSCOPY WITH SUBACROMIAL DECOMPRESSION, ROTATOR CUFF REPAIR AND BICEP TENDON REPAIR Right 03/10/2014   Procedure: RIGHT SHOULDER ARTHROSCOPY WITH SUBACROMIAL DECOMPRESSION, PARTIAL ACROMIOPLASTY WITH CORACOAROMIAL LABRUM DEBRIDEMENT RELEASE DISTAL CLAVICULECTOMY,  ROTATOR CUFF REPAIR AND EXTENSIVE DEBRIDEMENT;  Surgeon: Ninetta Lights, MD;  Location: [place];  Service: Orthopedics;  Laterality: Right;  . TENDON REPAIR  June 06, 2011   right elbow, Dr. Percell Miller    There were no vitals filed for this visit.      Subjective Assessment - 01/02/17 0803    Subjective Pt notes some increased soreness after last visit, but able to manage pain with icing.   Patient Stated Goals "get back to work"    Currently in Pain? Yes   Pain Score 4    Pain Location Shoulder   Pain Orientation Right;Upper                         [facility] Adult PT Treatment/Exercise - 01/02/17 0800  Self-Care   Self-Care Scar Mobilizations   Scar Mobilizations Pt instructed in scar mobs for surgical incision and portal sites     Shoulder Exercises: ROM/Strengthening   Pendulum flex/ext, horiz ABD/ADD & CCW x20     Modalities   Modalities Vasopneumatic     Vasopneumatic   Number Minutes Vasopneumatic  15 minutes   Vasopnuematic Location  Shoulder   Vasopneumatic Pressure Low   Vasopneumatic Temperature  Coldest temperature     Manual Therapy   Manual Therapy Passive ROM;Soft tissue  mobilization;Joint mobilization   Joint Mobilization Grade 1-2 inferior glide   Soft tissue mobilization Periscapular & RTC muscles; scar tissue mobs   Passive ROM R shoulder flexion, ER at side, and ABduction without rotation to pt tolerance and within protocol restrictions.                PT Education - 01/02/17 0843    Education provided Yes   Education Details Pendulum exercises and scar tissue mobs for HEP   Person(s) Educated Patient   Methods Explanation;Demonstration;Handout   Comprehension Verbalized understanding;Returned demonstration;Need further instruction          PT Short Term Goals - 01/02/17 0808      PT SHORT TERM GOAL #1   Title R shoulder PROM forward flexion to 140, ER at side to 40, & ABD without rotation to 80 by 01/24/17    Status On-going     PT SHORT TERM GOAL #2   Title independent with basic HEP by 01/17/17   Status On-going           PT Long Term Goals - 01/02/17 0808      PT LONG TERM GOAL #1   Title R shoulder AROM WFL by 03/28/17   Status On-going     PT LONG TERM GOAL #2   Title pt is able to return to all ADLs, chores, and work duties without limitation by shoulder pain, LOM, or weakness by 03/28/17.   Status On-going     PT LONG TERM GOAL #3   Title pt able to sleep without limitation by shoulder pain by 03/28/17   Status On-going     PT LONG TERM GOAL #4   Title R shoulder MMT >/= to 4/5 by 03/28/17   Status On-going               Plan - 01/02/17 0809    Clinical Impression Statement Pt better able to coordinate pendulum exercises today for all motions except CW circles, therefore added to HEP along with instructions in scar tissue mobs. Pt demonstrating better tolerance for PROM today with increased motion observed in all planes.   Rehab Potential Good   Clinical Impairments Affecting Rehab Potential history of multiple bilateral RTC surgeries; history of R elbow surgery   PT Next Visit Plan continue PROM of FF, ER at  side, ABD; heat/ice PRN; review pendulum exercises   Consulted and Agree with Plan of Care Patient      Patient will benefit from skilled therapeutic intervention in order to improve the following deficits and impairments:  Decreased range of motion, Decreased strength, Pain, Impaired UE functional use, Decreased activity tolerance, Decreased scar mobility, Postural dysfunction  Visit Diagnosis: Right shoulder pain, unspecified chronicity  Stiffness of right shoulder, not elsewhere classified  Abnormal posture  Muscle weakness (generalized)     Problem List Patient Active Problem List   Diagnosis Date Noted  . Dyslipidemia (high LDL; low HDL) 08/15/2016  . Complete  rotator cuff tear of left shoulder 07/05/2015  . Tennis elbow 05/30/2014  . Rotator cuff (capsule) sprain 03/10/2014  . Right shoulder pain 02/11/2014  . Memory difficulty 12/18/2012  . Gynecomastia, male 07/30/2012  . OSA (obstructive sleep apnea) 04/23/2011  . EXTERNAL HEMORRHOIDS 02/12/2011  . Prostate cancer ([place]) 11/12/2010  . PLANTAR FASCIITIS 04/09/2010  . Hypothyroidism 12/08/2009  . NEOPLASM OF UNCERTAIN BEHAVIOR OF SKIN 03/10/2009  . OSTEOARTHRITIS 02/14/2009  . HEARING DEFICIT 02/08/2009  . Essential hypertension 02/08/2009  . ALLERGIC RHINITIS 02/08/2009  . GERD 02/08/2009  . PROTEINURIA 02/08/2009  . DIVERTICULITIS, HX OF 02/08/2009    Percival Spanish, PT, MPT 01/02/2017, 8:47 AM  [facility] [address]  [place], [place], [address] Phone: [phone]   Fax:  [phone]  Name: MOSESE GERSTMAN MRN: [id number] Date of Birth: [birth year]

## 2017-01-06 MED FILL — LISINOPRIL 5 MG TABLET: 5 | 90 days supply | Qty: 90 | Fill #1

## 2017-01-07 ENCOUNTER — Ambulatory Visit: Payer: 59 | Admitting: Physical Therapy

## 2017-01-07 DIAGNOSIS — M25511 Pain in right shoulder: Secondary | ICD-10-CM

## 2017-01-07 DIAGNOSIS — M6281 Muscle weakness (generalized): Secondary | ICD-10-CM

## 2017-01-07 DIAGNOSIS — R293 Abnormal posture: Secondary | ICD-10-CM | POA: Diagnosis not present

## 2017-01-07 DIAGNOSIS — M25611 Stiffness of right shoulder, not elsewhere classified: Secondary | ICD-10-CM | POA: Diagnosis not present

## 2017-01-07 NOTE — Therapy (Signed)
[facility] [address]  [place], [place], [address] Phone: [phone]   Fax:  [phone]  Physical Therapy Treatment  Patient Details  Name: Zachary [name] MRN: [id number] Date of Birth: [birth year] Referring Provider: Dr. Corky Mull  Encounter Date: 01/07/2017      PT End of Session - 01/07/17 0808    Visit Number 3   Number of Visits 24   Date for PT Re-Evaluation 03/28/17   PT Start Time 0802   PT Stop Time 0848   PT Time Calculation (min) 46 min   Activity Tolerance Patient tolerated treatment well   Behavior During Therapy [facility] for tasks assessed/performed      Past Medical History:  Diagnosis Date  . Arthritis   . Articular cartilage disease    left shoulder  . Cancer ([place]) 2015   positive prostate cancer bx  . Diverticulitis   . Dyslipidemia (high LDL; low HDL) 08/15/2016  . GERD (gastroesophageal reflux disease)   . Hearing problem    hearing deficit  . Hemorrhoids   . Hypertension   . Hypothyroidism   . Sleep apnea    uses a cpap  . Wears glasses   . Wears hearing aid    both ears    Past Surgical History:  Procedure Laterality Date  . BACK SURGERY  1978   herniated disksurgery  . CARDIAC CATHETERIZATION  08/01/2010  . COLONOSCOPY    . EXAM UNDER ANESTHESIA WITH MANIPULATION OF SHOULDER Right 09/15/2014   Procedure: RIGHT SHOULDER MANIPULATION UNDER ANESTHESIA;  Surgeon: Ninetta Lights, MD;  Location: [place];  Service: Orthopedics;  Laterality: Right;  . HEMORRHOID SURGERY  08/2006  . SHOULDER ARTHROSCOPY WITH DISTAL CLAVICLE RESECTION Left 07/07/2015   Procedure: SHOULDER ARTHROSCOPY WITH DISTAL CLAVICLE RESECTION;  Surgeon: Kathryne Hitch, MD;  Location: [place];  Service: Orthopedics;  Laterality: Left;  . SHOULDER ARTHROSCOPY WITH ROTATOR CUFF REPAIR Left 12/07/2015   Procedure: LEFT SHOULDER ARTHROSCOPY DEBRIDEMENT, WITH ROTATOR CUFF  REPAIR;  Surgeon: Ninetta Lights, MD;  Location: [place];  Service: Orthopedics;  Laterality: Left;  . SHOULDER ARTHROSCOPY WITH ROTATOR CUFF REPAIR AND SUBACROMIAL DECOMPRESSION Left 07/07/2015   Procedure: LEFT SHOULDER SCOPE DEBRIDEMENT, SUBACROMIAL DECOMPRESSION, DISTAL CLAVICULECTOMY, ROTATOR CUFF REPAIR  ;  Surgeon: Kathryne Hitch, MD;  Location: [facility];  Service: Orthopedics;  Laterality: Left;  ANESTHESIA: GENERAL, PRE/POST OP SCALENE  . SHOULDER ARTHROSCOPY WITH SUBACROMIAL DECOMPRESSION, ROTATOR CUFF REPAIR AND BICEP TENDON REPAIR Right 03/10/2014   Procedure: RIGHT SHOULDER ARTHROSCOPY WITH SUBACROMIAL DECOMPRESSION, PARTIAL ACROMIOPLASTY WITH CORACOAROMIAL LABRUM DEBRIDEMENT RELEASE DISTAL CLAVICULECTOMY,  ROTATOR CUFF REPAIR AND EXTENSIVE DEBRIDEMENT;  Surgeon: Ninetta Lights, MD;  Location: [facility];  Service: Orthopedics;  Laterality: Right;  . TENDON REPAIR  June 06, 2011   right elbow, Dr. Percell Miller    There were no vitals filed for this visit.      Subjective Assessment - 01/07/17 0805    Subjective pt states shoulder has been feeling good. Pendulums have been going well with some pain. pt reports HEP has been going well.   Patient Stated Goals "get back to work"    Currently in Pain? No/denies   Pain Score 0-No pain                         [facility] Adult PT Treatment/Exercise - 01/07/17 0001  Modalities   Modalities Moist Heat     Moist Heat Therapy   Number Minutes Moist Heat 10 Minutes   Moist Heat Location Shoulder     Vasopneumatic   Number Minutes Vasopneumatic  10 minutes   Vasopnuematic Location  Shoulder   Vasopneumatic Pressure Low   Vasopneumatic Temperature  Coldest Temperature     Manual Therapy   Manual Therapy Passive ROM;Soft tissue mobilization;Joint mobilization   Joint Mobilization grade 1-2 inferior glide   Soft tissue mobilization Periscapular and RTC muscles   Passive ROM  R shoulder flexion, ER at side, & abduction without rotation to pt tolerance and within protocol limitations                  PT Short Term Goals - 01/07/17 1042      PT SHORT TERM GOAL #1   Title R shoulder PROM forward flexion to 140, ER at side to 40, & ABD without rotation to 80 by 01/24/17    Status On-going     PT SHORT TERM GOAL #2   Title independent with basic HEP by 01/17/17   Status Achieved           PT Long Term Goals - 01/02/17 0808      PT LONG TERM GOAL #1   Title R shoulder AROM WFL by 03/28/17   Status On-going     PT LONG TERM GOAL #2   Title pt is able to return to all ADLs, chores, and work duties without limitation by shoulder pain, LOM, or weakness by 03/28/17.   Status On-going     PT LONG TERM GOAL #3   Title pt able to sleep without limitation by shoulder pain by 03/28/17   Status On-going     PT LONG TERM GOAL #4   Title R shoulder MMT >/= to 4/5 by 03/28/17   Status On-going               Plan - 01/07/17 0809    Clinical Impression Statement Pt reports to therapy stating no pain today and shoulder has been feeling good since last visit. Pt received moist heat pack to start therapy session to help relax shoulder. Continued with PROM of R shoulder in all planes with pt reporting some stiffness and pain with movements. All movements are progressing well with pt being able to better tolerate the movements. Pt reported pain increased to 4-5/10 following PROM but had decreased following ice. Pt continuing HEP with no reported problems.     Rehab Potential Good   Clinical Impairments Affecting Rehab Potential history of multiple bilateral RTC surgeries; history of R elbow surgery   PT Treatment/Interventions Patient/family education;ADLs/Self Care Home Management;Passive range of motion;Manual techniques;Therapeutic exercise;Therapeutic activities;Cryotherapy;Vasopneumatic Device;Moist Heat;Electrical Stimulation;Scar mobilization   PT Next Visit  Plan pt sees Dr on Friday 01/10/17 will need ROM measuremments & note sent to Dr; continue PROM of FF, ER at side, ABD; heat/ice PRN      Patient will benefit from skilled therapeutic intervention in order to improve the following deficits and impairments:  Decreased range of motion, Decreased strength, Pain, Impaired UE functional use, Decreased activity tolerance, Decreased scar mobility, Postural dysfunction  Visit Diagnosis: Right shoulder pain, unspecified chronicity  Stiffness of right shoulder, not elsewhere classified  Abnormal posture  Muscle weakness (generalized)     Problem List Patient Active Problem List   Diagnosis Date Noted  . Dyslipidemia (high LDL; low HDL) 08/15/2016  . Complete rotator cuff tear  of left shoulder 07/05/2015  . Tennis elbow 05/30/2014  . Rotator cuff (capsule) sprain 03/10/2014  . Right shoulder pain 02/11/2014  . Memory difficulty 12/18/2012  . Gynecomastia, male 07/30/2012  . OSA (obstructive sleep apnea) 04/23/2011  . EXTERNAL HEMORRHOIDS 02/12/2011  . Prostate cancer ([place]) 11/12/2010  . PLANTAR FASCIITIS 04/09/2010  . Hypothyroidism 12/08/2009  . NEOPLASM OF UNCERTAIN BEHAVIOR OF SKIN 03/10/2009  . OSTEOARTHRITIS 02/14/2009  . HEARING DEFICIT 02/08/2009  . Essential hypertension 02/08/2009  . ALLERGIC RHINITIS 02/08/2009  . GERD 02/08/2009  . PROTEINURIA 02/08/2009  . DIVERTICULITIS, HX OF 02/08/2009    Lauralee Evener, SPT 01/07/2017, 10:45 AM  [facility] [address]  [place], [place], [address] Phone: [phone]   Fax:  [phone]  Name: Zachary [name] MRN: [id number] Date of Birth: [birth year]

## 2017-01-09 ENCOUNTER — Ambulatory Visit: Payer: 59

## 2017-01-14 ENCOUNTER — Ambulatory Visit: Payer: 59 | Admitting: Physical Therapy

## 2017-01-14 DIAGNOSIS — M6281 Muscle weakness (generalized): Secondary | ICD-10-CM | POA: Diagnosis not present

## 2017-01-14 DIAGNOSIS — R293 Abnormal posture: Secondary | ICD-10-CM | POA: Diagnosis not present

## 2017-01-14 DIAGNOSIS — M25511 Pain in right shoulder: Secondary | ICD-10-CM

## 2017-01-14 DIAGNOSIS — M25611 Stiffness of right shoulder, not elsewhere classified: Secondary | ICD-10-CM | POA: Diagnosis not present

## 2017-01-14 NOTE — Therapy (Signed)
[facility] [address]  [place], [place], [address] Phone: [phone]   Fax:  [phone]  Physical Therapy Treatment  Patient Details  Name: Zachary [name] MRN: [id number] Date of Birth: 01-25-1951 Referring Provider: Dr. Corky Mull  Encounter Date: 01/14/2017      PT End of Session - 01/14/17 0859    Visit Number 4   Number of Visits 24   Date for PT Re-Evaluation 03/28/17   PT Start Time 0800   PT Stop Time 0853   PT Time Calculation (min) 53 min   Activity Tolerance Patient tolerated treatment well;No increased pain   Behavior During Therapy [facility] for tasks assessed/performed      Past Medical History:  Diagnosis Date  . Arthritis   . Articular cartilage disease    left shoulder  . Cancer ([place]) 2015   positive prostate cancer bx  . Diverticulitis   . Dyslipidemia (high LDL; low HDL) 08/15/2016  . GERD (gastroesophageal reflux disease)   . Hearing problem    hearing deficit  . Hemorrhoids   . Hypertension   . Hypothyroidism   . Sleep apnea    uses a cpap  . Wears glasses   . Wears hearing aid    both ears    Past Surgical History:  Procedure Laterality Date  . BACK SURGERY  1978   herniated disksurgery  . CARDIAC CATHETERIZATION  08/01/2010  . COLONOSCOPY    . EXAM UNDER ANESTHESIA WITH MANIPULATION OF SHOULDER Right 09/15/2014   Procedure: RIGHT SHOULDER MANIPULATION UNDER ANESTHESIA;  Surgeon: Ninetta Lights, MD;  Location: [facility];  Service: Orthopedics;  Laterality: Right;  . HEMORRHOID SURGERY  08/2006  . SHOULDER ARTHROSCOPY WITH DISTAL CLAVICLE RESECTION Left 07/07/2015   Procedure: SHOULDER ARTHROSCOPY WITH DISTAL CLAVICLE RESECTION;  Surgeon: Kathryne Hitch, MD;  Location: [facility];  Service: Orthopedics;  Laterality: Left;  . SHOULDER ARTHROSCOPY WITH ROTATOR CUFF REPAIR Left 12/07/2015   Procedure: LEFT SHOULDER ARTHROSCOPY DEBRIDEMENT, WITH  ROTATOR CUFF REPAIR;  Surgeon: Ninetta Lights, MD;  Location: [facility];  Service: Orthopedics;  Laterality: Left;  . SHOULDER ARTHROSCOPY WITH ROTATOR CUFF REPAIR AND SUBACROMIAL DECOMPRESSION Left 07/07/2015   Procedure: LEFT SHOULDER SCOPE DEBRIDEMENT, SUBACROMIAL DECOMPRESSION, DISTAL CLAVICULECTOMY, ROTATOR CUFF REPAIR  ;  Surgeon: Kathryne Hitch, MD;  Location: [facility];  Service: Orthopedics;  Laterality: Left;  ANESTHESIA: GENERAL, PRE/POST OP SCALENE  . SHOULDER ARTHROSCOPY WITH SUBACROMIAL DECOMPRESSION, ROTATOR CUFF REPAIR AND BICEP TENDON REPAIR Right 03/10/2014   Procedure: RIGHT SHOULDER ARTHROSCOPY WITH SUBACROMIAL DECOMPRESSION, PARTIAL ACROMIOPLASTY WITH CORACOAROMIAL LABRUM DEBRIDEMENT RELEASE DISTAL CLAVICULECTOMY,  ROTATOR CUFF REPAIR AND EXTENSIVE DEBRIDEMENT;  Surgeon: Ninetta Lights, MD;  Location: [facility];  Service: Orthopedics;  Laterality: Right;  . TENDON REPAIR  June 06, 2011   right elbow, Dr. Percell Miller    There were no vitals filed for this visit.      Subjective Assessment - 01/14/17 0803    Subjective Pt returned to therapy after recent doctor visit with a good report. Pt was allowed to return to work this week to begin doing office/computer work and was advised to wear sling while at work to prevent pt from overdoing anything. He was also told not to do any lifting, pulling, pushing, or climbing. Pt reports arm has been feeling very sore and has had trouble with sleeping on that R shoulder at night. He continues to  work on the pendulum exercises at home.    Patient Stated Goals "get back to work"    Currently in Pain? Yes   Pain Score 4    Pain Location Shoulder   Pain Orientation Right;Upper   Pain Descriptors / Indicators Sharp   Pain Type Surgical pain;Acute pain            [facility] PT Assessment - 01/14/17 0001      Assessment   Referring Provider Dr. Corky Mull   Next MD Visit 03/05/17                      [facility] Adult PT Treatment/Exercise - 01/14/17 0809      Shoulder Exercises: Supine   External Rotation AAROM;Right;20 reps   External Rotation Limitations using cane; 3" holds   Flexion AAROM;Right;20 reps   Flexion Limitations using cane with PT assist scap depression; 3" holds     Shoulder Exercises: Pulleys   Flexion --   ABduction --     Shoulder Exercises: Stretch   External Rotation Stretch --     Modalities   Modalities Moist Heat     Moist Heat Therapy   Number Minutes Moist Heat 10 Minutes   Moist Heat Location Shoulder     Vasopneumatic   Number Minutes Vasopneumatic  10 minutes   Vasopnuematic Location  Shoulder   Vasopneumatic Pressure Low   Vasopneumatic Temperature  Coldest temperature     Manual Therapy   Manual Therapy Passive ROM;Soft tissue mobilization;Joint mobilization   Joint Mobilization Grade 1-2 inferior glide with arm at side in neutral   Soft tissue mobilization Periscapular and RTC Muscles   Passive ROM R shoulder flexion, ER at side, & abduction without rotation to pt tolerance and within protocol limitations                  PT Short Term Goals - 01/07/17 1042      PT SHORT TERM GOAL #1   Title R shoulder PROM forward flexion to 140, ER at side to 40, & ABD without rotation to 80 by 01/24/17    Status On-going     PT SHORT TERM GOAL #2   Title independent with basic HEP by 01/17/17   Status Achieved           PT Long Term Goals - 01/02/17 0808      PT LONG TERM GOAL #1   Title R shoulder AROM WFL by 03/28/17   Status On-going     PT LONG TERM GOAL #2   Title pt is able to return to all ADLs, chores, and work duties without limitation by shoulder pain, LOM, or weakness by 03/28/17.   Status On-going     PT LONG TERM GOAL #3   Title pt able to sleep without limitation by shoulder pain by 03/28/17   Status On-going     PT LONG TERM GOAL #4   Title R shoulder MMT >/= to 4/5 by 03/28/17   Status  On-going               Plan - 01/14/17 0902    Clinical Impression Statement Pt continues to show progress with R shoulder PROM with better tolerance of movement but continues to report some discomfort and pain during R shoulder flexion & ER PROM. Continued with soft tissue mobilization and joint mobilizations to help control pain and facilitate movement. Pt was able to begin R shoulder AAROM in supine  with wand for flexion and ER at side. Pt had increased scapular elevation during flexion AAROM with wand requiring PT to provide assistance to promote scapular depression therefore pt was not given exercise to do at home. Pt continued with heat before treatment to relax R shoulder and ice following treatment to help with pain control. Pt reported no increased pain following treatment.    Rehab Potential Good   Clinical Impairments Affecting Rehab Potential history of multiple bilateral RTC surgeries; history of R elbow surgery   PT Treatment/Interventions Patient/family education;ADLs/Self Care Home Management;Passive range of motion;Manual techniques;Therapeutic exercise;Therapeutic activities;Cryotherapy;Vasopneumatic Device;Moist Heat;Electrical Stimulation;Scar mobilization   PT Next Visit Plan Continue with PROM & grade1-2 joint mobs for pain in supine per protocol; supine AAROM flexion & ER at side with wand- if pt unable to keep scap depressed during flexion, try table slides; initiate scapular retraction; heat/ice PRN   Consulted and Agree with Plan of Care Patient      Patient will benefit from skilled therapeutic intervention in order to improve the following deficits and impairments:  Decreased range of motion, Decreased strength, Pain, Impaired UE functional use, Decreased activity tolerance, Decreased scar mobility, Postural dysfunction  Visit Diagnosis: Right shoulder pain, unspecified chronicity  Stiffness of right shoulder, not elsewhere classified  Abnormal  posture  Muscle weakness (generalized)     Problem List Patient Active Problem List   Diagnosis Date Noted  . Dyslipidemia (high LDL; low HDL) 08/15/2016  . Complete rotator cuff tear of left shoulder 07/05/2015  . Tennis elbow 05/30/2014  . Rotator cuff (capsule) sprain 03/10/2014  . Right shoulder pain 02/11/2014  . Memory difficulty 12/18/2012  . Gynecomastia, male 07/30/2012  . OSA (obstructive sleep apnea) 04/23/2011  . EXTERNAL HEMORRHOIDS 02/12/2011  . Prostate cancer ([place]) 11/12/2010  . PLANTAR FASCIITIS 04/09/2010  . Hypothyroidism 12/08/2009  . NEOPLASM OF UNCERTAIN BEHAVIOR OF SKIN 03/10/2009  . OSTEOARTHRITIS 02/14/2009  . HEARING DEFICIT 02/08/2009  . Essential hypertension 02/08/2009  . ALLERGIC RHINITIS 02/08/2009  . GERD 02/08/2009  . PROTEINURIA 02/08/2009  . DIVERTICULITIS, HX OF 02/08/2009    Lauralee Evener, SPT 01/14/2017, 3:20 PM  [facility] [address]  [place], [place], [address] Phone: [phone]   Fax:  [phone]  Name: Zachary [name] MRN: [id number] Date of Birth: [birth year]

## 2017-01-15 DIAGNOSIS — G4733 Obstructive sleep apnea (adult) (pediatric): Secondary | ICD-10-CM | POA: Diagnosis not present

## 2017-01-16 ENCOUNTER — Ambulatory Visit: Payer: 59

## 2017-01-16 DIAGNOSIS — R293 Abnormal posture: Secondary | ICD-10-CM | POA: Diagnosis not present

## 2017-01-16 DIAGNOSIS — M25611 Stiffness of right shoulder, not elsewhere classified: Secondary | ICD-10-CM

## 2017-01-16 DIAGNOSIS — M6281 Muscle weakness (generalized): Secondary | ICD-10-CM

## 2017-01-16 DIAGNOSIS — M25511 Pain in right shoulder: Secondary | ICD-10-CM

## 2017-01-16 NOTE — Therapy (Signed)
[facility] [address]  [place], [place], [address] Phone: [phone]   Fax:  [phone]  Physical Therapy Treatment  Patient Details  Name: Zachary [name] MRN: [id number] Date of Birth: January 23, 1951 Referring Provider: Dr. Corky Mull  Encounter Date: 01/16/2017      PT End of Session - 01/16/17 0807    Visit Number 5   Number of Visits 24   Date for PT Re-Evaluation 03/28/17   PT Start Time 0802   PT Stop Time 0856   PT Time Calculation (min) 54 min   Activity Tolerance Patient tolerated treatment well;No increased pain   Behavior During Therapy WFL for tasks assessed/performed      Past Medical History:  Diagnosis Date  . Arthritis   . Articular cartilage disease    left shoulder  . Cancer ([place]) 2015   positive prostate cancer bx  . Diverticulitis   . Dyslipidemia (high LDL; low HDL) 08/15/2016  . GERD (gastroesophageal reflux disease)   . Hearing problem    hearing deficit  . Hemorrhoids   . Hypertension   . Hypothyroidism   . Sleep apnea    uses a cpap  . Wears glasses   . Wears hearing aid    both ears    Past Surgical History:  Procedure Laterality Date  . BACK SURGERY  1978   herniated disksurgery  . CARDIAC CATHETERIZATION  08/01/2010  . COLONOSCOPY    . EXAM UNDER ANESTHESIA WITH MANIPULATION OF SHOULDER Right 09/15/2014   Procedure: RIGHT SHOULDER MANIPULATION UNDER ANESTHESIA;  Surgeon: Ninetta Lights, MD;  Location: [facility];  Service: Orthopedics;  Laterality: Right;  . HEMORRHOID SURGERY  08/2006  . SHOULDER ARTHROSCOPY WITH DISTAL CLAVICLE RESECTION Left 07/07/2015   Procedure: SHOULDER ARTHROSCOPY WITH DISTAL CLAVICLE RESECTION;  Surgeon: Kathryne Hitch, MD;  Location: [facility];  Service: Orthopedics;  Laterality: Left;  . SHOULDER ARTHROSCOPY WITH ROTATOR CUFF REPAIR Left 12/07/2015   Procedure: LEFT SHOULDER ARTHROSCOPY DEBRIDEMENT, WITH  ROTATOR CUFF REPAIR;  Surgeon: Ninetta Lights, MD;  Location: [facility];  Service: Orthopedics;  Laterality: Left;  . SHOULDER ARTHROSCOPY WITH ROTATOR CUFF REPAIR AND SUBACROMIAL DECOMPRESSION Left 07/07/2015   Procedure: LEFT SHOULDER SCOPE DEBRIDEMENT, SUBACROMIAL DECOMPRESSION, DISTAL CLAVICULECTOMY, ROTATOR CUFF REPAIR  ;  Surgeon: Kathryne Hitch, MD;  Location: [facility];  Service: Orthopedics;  Laterality: Left;  ANESTHESIA: GENERAL, PRE/POST OP SCALENE  . SHOULDER ARTHROSCOPY WITH SUBACROMIAL DECOMPRESSION, ROTATOR CUFF REPAIR AND BICEP TENDON REPAIR Right 03/10/2014   Procedure: RIGHT SHOULDER ARTHROSCOPY WITH SUBACROMIAL DECOMPRESSION, PARTIAL ACROMIOPLASTY WITH CORACOAROMIAL LABRUM DEBRIDEMENT RELEASE DISTAL CLAVICULECTOMY,  ROTATOR CUFF REPAIR AND EXTENSIVE DEBRIDEMENT;  Surgeon: Ninetta Lights, MD;  Location: [facility];  Service: Orthopedics;  Laterality: Right;  . TENDON REPAIR  June 06, 2011   right elbow, Dr. Percell Miller    There were no vitals filed for this visit.      Subjective Assessment - 01/16/17 0806    Subjective Pt. reporting he still feels benefit from heat to shoulder.  Pt. reporting has been somewhat worse today without known trigger.     Patient Stated Goals "get back to work"    Currently in Pain? Yes   Pain Score 7    Pain Location Shoulder   Pain Orientation Right;Upper   Pain Descriptors / Indicators Sharp   Pain Type Surgical pain;Acute pain   Pain Onset More than a month  ago   Pain Frequency Intermittent   Aggravating Factors  sleeping    Pain Relieving Factors tylenol    Multiple Pain Sites No           [facility] Adult PT Treatment/Exercise - 01/16/17 0810      Shoulder Exercises: Supine   External Rotation AAROM;Right;20 reps   External Rotation Limitations using cane; 5" holds   Flexion AAROM;Right;20 reps   Flexion Limitations using cane; 5" hold      Shoulder Exercises: Seated   Retraction AROM;10  reps;Strengthening  10" hold; tactile cues provided for scapular squeeze    External Rotation AAROM;15 reps  5" hold    External Rotation Limitations with wand; tactile cues required to prevent abduction      Modalities   Modalities Moist Heat;Vasopneumatic     Moist Heat Therapy   Number Minutes Moist Heat 8 Minutes   Moist Heat Location Shoulder  R shoulder in hooklying     Vasopneumatic   Number Minutes Vasopneumatic  10 minutes   Vasopnuematic Location  Shoulder   Vasopneumatic Pressure Low   Vasopneumatic Temperature  Coldest temperature     Manual Therapy   Manual Therapy Passive ROM;Soft tissue mobilization;Joint mobilization   Joint Mobilization Grade 1-2 inferior glide with arm at side in neutral   Soft tissue mobilization medial scap. border and teres minor area   Passive ROM R shoulder flexion, ER in scap. plane, & abduction without rotation to pt tolerance and within protocol limitations            PT Short Term Goals - 01/07/17 1042      PT SHORT TERM GOAL #1   Title R shoulder PROM forward flexion to 140, ER at side to 40, & ABD without rotation to 80 by 01/24/17    Status On-going     PT SHORT TERM GOAL #2   Title independent with basic HEP by 01/17/17   Status Achieved           PT Long Term Goals - 01/02/17 0808      PT LONG TERM GOAL #1   Title R shoulder AROM WFL by 03/28/17   Status On-going     PT LONG TERM GOAL #2   Title pt is able to return to all ADLs, chores, and work duties without limitation by shoulder pain, LOM, or weakness by 03/28/17.   Status On-going     PT LONG TERM GOAL #3   Title pt able to sleep without limitation by shoulder pain by 03/28/17   Status On-going     PT LONG TERM GOAL #4   Title R shoulder MMT >/= to 4/5 by 03/28/17   Status On-going               Plan - 01/16/17 0808    Clinical Impression Statement Pt. noting R shoulder pain worse today with no known trigger.  Today's treatment focused on R shoulder  PROM with STM/TPR to posterior shoulder to improve ROM.  Scapular retraction initiated today with pt. requiring tactile cueing for technique.  Reps with wand ER, flexion continued in supine today with pt. still very guarded.  AAROM pulleys not initiated today due to continued muscular guarding with wand activities.  Moist heat continued to reduce muscular guarding for PROM and treatment concluding with ice/compression to promote pain reduction.  PROM not measured today however pt. appearing to demo improved flexion ROM with AAROM activities.  Pt. will continue to benefit from further  skilled therapy to improved UE strength, ROM, and function per protocol.     PT Treatment/Interventions Patient/family education;ADLs/Self Care Home Management;Passive range of motion;Manual techniques;Therapeutic exercise;Therapeutic activities;Cryotherapy;Vasopneumatic Device;Moist Heat;Electrical Stimulation;Scar mobilization   PT Next Visit Plan Continue with PROM & grade1-2 joint mobs for pain in supine per protocol; supine AAROM flexion & ER at side with wand- if pt unable to keep scap depressed during flexion, try table slides; heat/ice PRN      Patient will benefit from skilled therapeutic intervention in order to improve the following deficits and impairments:  Decreased range of motion, Decreased strength, Pain, Impaired UE functional use, Decreased activity tolerance, Decreased scar mobility, Postural dysfunction  Visit Diagnosis: Right shoulder pain, unspecified chronicity  Stiffness of right shoulder, not elsewhere classified  Abnormal posture  Muscle weakness (generalized)     Problem List Patient Active Problem List   Diagnosis Date Noted  . Dyslipidemia (high LDL; low HDL) 08/15/2016  . Complete rotator cuff tear of left shoulder 07/05/2015  . Tennis elbow 05/30/2014  . Rotator cuff (capsule) sprain 03/10/2014  . Right shoulder pain 02/11/2014  . Memory difficulty 12/18/2012  . Gynecomastia,  male 07/30/2012  . OSA (obstructive sleep apnea) 04/23/2011  . EXTERNAL HEMORRHOIDS 02/12/2011  . Prostate cancer ([place]) 11/12/2010  . PLANTAR FASCIITIS 04/09/2010  . Hypothyroidism 12/08/2009  . NEOPLASM OF UNCERTAIN BEHAVIOR OF SKIN 03/10/2009  . OSTEOARTHRITIS 02/14/2009  . HEARING DEFICIT 02/08/2009  . Essential hypertension 02/08/2009  . ALLERGIC RHINITIS 02/08/2009  . GERD 02/08/2009  . PROTEINURIA 02/08/2009  . DIVERTICULITIS, HX OF 02/08/2009    Bess Harvest, PTA 01/16/17 9:14 AM   [facility] [address]  [place], [place], [address] Phone: [phone]   Fax:  [phone]  Name: Zachary [name] MRN: [id number] Date of Birth: 09-13-1951

## 2017-01-21 ENCOUNTER — Ambulatory Visit: Payer: 59 | Admitting: Physical Therapy

## 2017-01-21 DIAGNOSIS — R293 Abnormal posture: Secondary | ICD-10-CM

## 2017-01-21 DIAGNOSIS — M25611 Stiffness of right shoulder, not elsewhere classified: Secondary | ICD-10-CM

## 2017-01-21 DIAGNOSIS — M25511 Pain in right shoulder: Secondary | ICD-10-CM

## 2017-01-21 DIAGNOSIS — M6281 Muscle weakness (generalized): Secondary | ICD-10-CM | POA: Diagnosis not present

## 2017-01-21 NOTE — Therapy (Signed)
[facility] [address]  [place], [place], [address] Phone: [phone]   Fax:  [phone]  Physical Therapy Treatment  Patient Details  Name: Zachary [name] MRN: [id number] Date of Birth: 07-08-1951 Referring Provider: Dr. Corky Mull  Encounter Date: 01/21/2017      PT End of Session - 01/21/17 0808    Visit Number 6   Number of Visits 24   Date for PT Re-Evaluation 03/28/17   PT Start Time 0807   PT Stop Time 0856   PT Time Calculation (min) 49 min   Activity Tolerance Patient tolerated treatment well;No increased pain   Behavior During Therapy WFL for tasks assessed/performed      Past Medical History:  Diagnosis Date  . Arthritis   . Articular cartilage disease    left shoulder  . Cancer ([place]) 2015   positive prostate cancer bx  . Diverticulitis   . Dyslipidemia (high LDL; low HDL) 08/15/2016  . GERD (gastroesophageal reflux disease)   . Hearing problem    hearing deficit  . Hemorrhoids   . Hypertension   . Hypothyroidism   . Sleep apnea    uses a cpap  . Wears glasses   . Wears hearing aid    both ears    Past Surgical History:  Procedure Laterality Date  . BACK SURGERY  1978   herniated disksurgery  . CARDIAC CATHETERIZATION  08/01/2010  . COLONOSCOPY    . EXAM UNDER ANESTHESIA WITH MANIPULATION OF SHOULDER Right 09/15/2014   Procedure: RIGHT SHOULDER MANIPULATION UNDER ANESTHESIA;  Surgeon: Ninetta Lights, MD;  Location: [facility];  Service: Orthopedics;  Laterality: Right;  . HEMORRHOID SURGERY  08/2006  . SHOULDER ARTHROSCOPY WITH DISTAL CLAVICLE RESECTION Left 07/07/2015   Procedure: SHOULDER ARTHROSCOPY WITH DISTAL CLAVICLE RESECTION;  Surgeon: Kathryne Hitch, MD;  Location: [facility];  Service: Orthopedics;  Laterality: Left;  . SHOULDER ARTHROSCOPY WITH ROTATOR CUFF REPAIR Left 12/07/2015   Procedure: LEFT SHOULDER ARTHROSCOPY DEBRIDEMENT, WITH  ROTATOR CUFF REPAIR;  Surgeon: Ninetta Lights, MD;  Location: [facility];  Service: Orthopedics;  Laterality: Left;  . SHOULDER ARTHROSCOPY WITH ROTATOR CUFF REPAIR AND SUBACROMIAL DECOMPRESSION Left 07/07/2015   Procedure: LEFT SHOULDER SCOPE DEBRIDEMENT, SUBACROMIAL DECOMPRESSION, DISTAL CLAVICULECTOMY, ROTATOR CUFF REPAIR  ;  Surgeon: Kathryne Hitch, MD;  Location: [facility];  Service: Orthopedics;  Laterality: Left;  ANESTHESIA: GENERAL, PRE/POST OP SCALENE  . SHOULDER ARTHROSCOPY WITH SUBACROMIAL DECOMPRESSION, ROTATOR CUFF REPAIR AND BICEP TENDON REPAIR Right 03/10/2014   Procedure: RIGHT SHOULDER ARTHROSCOPY WITH SUBACROMIAL DECOMPRESSION, PARTIAL ACROMIOPLASTY WITH CORACOAROMIAL LABRUM DEBRIDEMENT RELEASE DISTAL CLAVICULECTOMY,  ROTATOR CUFF REPAIR AND EXTENSIVE DEBRIDEMENT;  Surgeon: Ninetta Lights, MD;  Location: [facility];  Service: Orthopedics;  Laterality: Right;  . TENDON REPAIR  June 06, 2011   right elbow, Dr. Percell Miller    There were no vitals filed for this visit.      Subjective Assessment - 01/21/17 0808    Subjective Pt reports shoulder has been feeling alright, just tight and sore.   Patient Stated Goals "get back to work"    Currently in Pain? Yes   Pain Score 3                          [facility] Adult PT Treatment/Exercise - 01/21/17 0811      Shoulder Exercises: Supine   External Rotation AAROM;Right;20  reps   External Rotation Limitations using cane; 5" holds   Flexion AAROM;Right;20 reps   Flexion Limitations using cane 5" holds;   ABduction AAROM;Right;20 reps   ABduction Limitations in scapular plane; using cane; 5" holds     Shoulder Exercises: Seated   External Rotation --   External Rotation Limitations --     Moist Heat Therapy   Number Minutes Moist Heat 10 Minutes   Moist Heat Location Shoulder     Vasopneumatic   Number Minutes Vasopneumatic  10 minutes   Vasopnuematic Location  Shoulder    Vasopneumatic Pressure Low   Vasopneumatic Temperature  Coldest temperature     Manual Therapy   Manual Therapy Passive ROM;Soft tissue mobilization;Joint mobilization   Joint Mobilization Grade 1-2 inferior glide with arm at side in neutral   Soft tissue mobilization Periscapular and RTC Muscles   Passive ROM R shoulder flexion, ER at side, & abduction without rotation to pt tolerance and within protocol limitations                  PT Short Term Goals - 01/07/17 1042      PT SHORT TERM GOAL #1   Title R shoulder PROM forward flexion to 140, ER at side to 40, & ABD without rotation to 80 by 01/24/17    Status On-going     PT SHORT TERM GOAL #2   Title independent with basic HEP by 01/17/17   Status Achieved           PT Long Term Goals - 01/02/17 0808      PT LONG TERM GOAL #1   Title R shoulder AROM WFL by 03/28/17   Status On-going     PT LONG TERM GOAL #2   Title pt is able to return to all ADLs, chores, and work duties without limitation by shoulder pain, LOM, or weakness by 03/28/17.   Status On-going     PT LONG TERM GOAL #3   Title pt able to sleep without limitation by shoulder pain by 03/28/17   Status On-going     PT LONG TERM GOAL #4   Title R shoulder MMT >/= to 4/5 by 03/28/17   Status On-going               Plan - 01/21/17 1040    Clinical Impression Statement Pt continues to show improvement with R shoulder PROM. Pt continues to guard during PROM with constant verbal cues needed to relax his arm. Pt reports still having some pain and discomfort during PROM with the shoulder feeling very stiff at first. Pt was able to continue with ER and flexion AAROM of R shoulder with wand in supine. R shoulder abduction AAROM with wand was introduced to patient in supine today. Pt needed some tactile cueing and therapist assistance to promote shoulder depression during shoulder flexion and abduction. STM of posterior and anterior shoulder and inferior glide  grade 1-2 joint mobilizations were continued to help pt relax and reduce pain following shoulder PROM. Moist heat was continued prior to therapy to help pt relax and ice was continued following therapy for pain reduction. Pt will continue to benefit from therapy to increase ROM, strength, and R shoulder function per protocol.    Rehab Potential Good   Clinical Impairments Affecting Rehab Potential history of multiple bilateral RTC surgeries; history of R elbow surgery   PT Treatment/Interventions Patient/family education;ADLs/Self Care Home Management;Passive range of motion;Manual techniques;Therapeutic exercise;Therapeutic activities;Cryotherapy;Vasopneumatic Device;Moist Heat;Electrical Stimulation;Scar  mobilization   PT Next Visit Plan Measure ROM for STG; continue with scapular retraction in sitting and prone; continue with PROM and grade 1-2 joint mobs for pain in supine per protocol; supine AAROM flexion, abduction, and ER at side with wand; introduce pulleys as appropriate; heat/ice PRN;    Consulted and Agree with Plan of Care Patient      Patient will benefit from skilled therapeutic intervention in order to improve the following deficits and impairments:  Decreased range of motion, Decreased strength, Pain, Impaired UE functional use, Decreased activity tolerance, Decreased scar mobility, Postural dysfunction  Visit Diagnosis: Right shoulder pain, unspecified chronicity  Stiffness of right shoulder, not elsewhere classified  Abnormal posture  Muscle weakness (generalized)     Problem List Patient Active Problem List   Diagnosis Date Noted  . Dyslipidemia (high LDL; low HDL) 08/15/2016  . Complete rotator cuff tear of left shoulder 07/05/2015  . Tennis elbow 05/30/2014  . Rotator cuff (capsule) sprain 03/10/2014  . Right shoulder pain 02/11/2014  . Memory difficulty 12/18/2012  . Gynecomastia, male 07/30/2012  . OSA (obstructive sleep apnea) 04/23/2011  . EXTERNAL  HEMORRHOIDS 02/12/2011  . Prostate cancer ([place]) 11/12/2010  . PLANTAR FASCIITIS 04/09/2010  . Hypothyroidism 12/08/2009  . NEOPLASM OF UNCERTAIN BEHAVIOR OF SKIN 03/10/2009  . OSTEOARTHRITIS 02/14/2009  . HEARING DEFICIT 02/08/2009  . Essential hypertension 02/08/2009  . ALLERGIC RHINITIS 02/08/2009  . GERD 02/08/2009  . PROTEINURIA 02/08/2009  . DIVERTICULITIS, HX OF 02/08/2009    Lauralee Evener, SPT 01/21/2017, 12:58 PM  [facility] [address]  [place], [place], [address] Phone: [phone]   Fax:  [phone]  Name: Zachary [name] MRN: [id number] Date of Birth: [birth year]

## 2017-01-23 ENCOUNTER — Ambulatory Visit: Payer: 59 | Attending: Sports Medicine

## 2017-01-23 DIAGNOSIS — M25611 Stiffness of right shoulder, not elsewhere classified: Secondary | ICD-10-CM | POA: Diagnosis not present

## 2017-01-23 DIAGNOSIS — R293 Abnormal posture: Secondary | ICD-10-CM | POA: Insufficient documentation

## 2017-01-23 DIAGNOSIS — M6281 Muscle weakness (generalized): Secondary | ICD-10-CM | POA: Insufficient documentation

## 2017-01-23 DIAGNOSIS — M25511 Pain in right shoulder: Secondary | ICD-10-CM | POA: Diagnosis not present

## 2017-01-23 NOTE — Therapy (Signed)
[facility] [address]  [place], [place], [address] Phone: [phone]   Fax:  [phone]  Physical Therapy Treatment  Patient Details  Name: Zachary [name] MRN: [id number] Date of Birth: [birth year] Referring Provider: Dr. Corky Mull  Encounter Date: 01/23/2017      PT End of Session - 01/23/17 0935    Visit Number 7   Number of Visits 24   Date for PT Re-Evaluation 03/28/17   PT Start Time 0929  Moist heat to start treatment    PT Stop Time 1021   PT Time Calculation (min) 52 min   Activity Tolerance Patient tolerated treatment well   Behavior During Therapy [facility] for tasks assessed/performed      Past Medical History:  Diagnosis Date  . Arthritis   . Articular cartilage disease    left shoulder  . Cancer ([place]) 2015   positive prostate cancer bx  . Diverticulitis   . Dyslipidemia (high LDL; low HDL) 08/15/2016  . GERD (gastroesophageal reflux disease)   . Hearing problem    hearing deficit  . Hemorrhoids   . Hypertension   . Hypothyroidism   . Sleep apnea    uses a cpap  . Wears glasses   . Wears hearing aid    both ears    Past Surgical History:  Procedure Laterality Date  . BACK SURGERY  1978   herniated disksurgery  . CARDIAC CATHETERIZATION  08/01/2010  . COLONOSCOPY    . EXAM UNDER ANESTHESIA WITH MANIPULATION OF SHOULDER Right 09/15/2014   Procedure: RIGHT SHOULDER MANIPULATION UNDER ANESTHESIA;  Surgeon: Ninetta Lights, MD;  Location: [place];  Service: Orthopedics;  Laterality: Right;  . HEMORRHOID SURGERY  08/2006  . SHOULDER ARTHROSCOPY WITH DISTAL CLAVICLE RESECTION Left 07/07/2015   Procedure: SHOULDER ARTHROSCOPY WITH DISTAL CLAVICLE RESECTION;  Surgeon: Kathryne Hitch, MD;  Location: [place];  Service: Orthopedics;  Laterality: Left;  . SHOULDER ARTHROSCOPY WITH ROTATOR CUFF REPAIR Left 12/07/2015   Procedure: LEFT SHOULDER ARTHROSCOPY  DEBRIDEMENT, WITH ROTATOR CUFF REPAIR;  Surgeon: Ninetta Lights, MD;  Location: [place];  Service: Orthopedics;  Laterality: Left;  . SHOULDER ARTHROSCOPY WITH ROTATOR CUFF REPAIR AND SUBACROMIAL DECOMPRESSION Left 07/07/2015   Procedure: LEFT SHOULDER SCOPE DEBRIDEMENT, SUBACROMIAL DECOMPRESSION, DISTAL CLAVICULECTOMY, ROTATOR CUFF REPAIR  ;  Surgeon: Kathryne Hitch, MD;  Location: [place];  Service: Orthopedics;  Laterality: Left;  ANESTHESIA: GENERAL, PRE/POST OP SCALENE  . SHOULDER ARTHROSCOPY WITH SUBACROMIAL DECOMPRESSION, ROTATOR CUFF REPAIR AND BICEP TENDON REPAIR Right 03/10/2014   Procedure: RIGHT SHOULDER ARTHROSCOPY WITH SUBACROMIAL DECOMPRESSION, PARTIAL ACROMIOPLASTY WITH CORACOAROMIAL LABRUM DEBRIDEMENT RELEASE DISTAL CLAVICULECTOMY,  ROTATOR CUFF REPAIR AND EXTENSIVE DEBRIDEMENT;  Surgeon: Ninetta Lights, MD;  Location: [place];  Service: Orthopedics;  Laterality: Right;  . TENDON REPAIR  June 06, 2011   right elbow, Dr. Percell Miller    There were no vitals filed for this visit.      Subjective Assessment - 01/23/17 0932    Subjective Pt. noting he is still waking with R shoulder pain 3x/night   Patient Stated Goals "get back to work"    Currently in Pain? Yes   Pain Score 5    Pain Location Shoulder   Pain Orientation Right;Upper   Pain Descriptors / Indicators Sharp   Pain Type Surgical pain   Pain Onset More than a month ago   Pain Frequency Intermittent  Aggravating Factors  movement    Effect of Pain on Daily Activities not sleeping well    Multiple Pain Sites No            [facility] PT Assessment - 01/23/17 0946      PROM   Right Shoulder Flexion 151 Degrees  slight increase in pain   Right Shoulder External Rotation 46 Degrees  by side   Right Shoulder Horizontal ABduction 91 Degrees  without rotation; slight increase in pain             [facility] Adult PT Treatment/Exercise - 01/23/17 0938      Shoulder  Exercises: Supine   External Rotation AAROM;Right;20 reps   External Rotation Limitations using cane; 5" holds   Flexion AAROM;Right;20 reps   Flexion Limitations using cane 5" holds;   ABduction AAROM;Right;20 reps   ABduction Limitations in scapular plane; using cane; 5" holds     Shoulder Exercises: Seated   Retraction AROM;Strengthening;15 reps   Theraband Level (Shoulder Retraction) --  10 sec hold      Moist Heat Therapy   Number Minutes Moist Heat 10 Minutes   Moist Heat Location Shoulder     Vasopneumatic   Number Minutes Vasopneumatic  10 minutes   Vasopnuematic Location  Shoulder   Vasopneumatic Pressure Low   Vasopneumatic Temperature  Coldest temperature     Manual Therapy   Manual Therapy Passive ROM;Soft tissue mobilization;Joint mobilization   Joint Mobilization Grade 1-2 inferior glide with arm at side in neutral   Soft tissue mobilization R UT, and teres minor area   Passive ROM R shoulder flexion, ER at side, & abduction without rotation to pt tolerance and within protocol limitations            PT Short Term Goals - 01/23/17 0951      PT SHORT TERM GOAL #1   Title R shoulder PROM forward flexion to 140, ER at side to 40, & ABD without rotation to 80 by 01/24/17    Status Achieved     PT SHORT TERM GOAL #2   Title independent with basic HEP by 01/17/17   Status Achieved           PT Long Term Goals - 01/02/17 0808      PT LONG TERM GOAL #1   Title R shoulder AROM WFL by 03/28/17   Status On-going     PT LONG TERM GOAL #2   Title pt is able to return to all ADLs, chores, and work duties without limitation by shoulder pain, LOM, or weakness by 03/28/17.   Status On-going     PT LONG TERM GOAL #3   Title pt able to sleep without limitation by shoulder pain by 03/28/17   Status On-going     PT LONG TERM GOAL #4   Title R shoulder MMT >/= to 4/5 by 03/28/17   Status On-going               Plan - 01/23/17 0936    Clinical Impression  Statement Pt. demonstrating improved PROM today flex 151 dg, abduction 91 dg (without rotation), ER 46 dg (arm by side).  STM/TPR continued today to R teres Minor/Major and UT to promote tissue relaxation and improve ROM.  Pt. still noting benefit from moist heat to start treatment thus this continued today.  Treatment ending with ice/compression to promote pain reduction.  Pt. will continues to benefit from further skilled therapy to improve UE ROM and  functional capacity per protocol.     PT Treatment/Interventions Patient/family education;ADLs/Self Care Home Management;Passive range of motion;Manual techniques;Therapeutic exercise;Therapeutic activities;Cryotherapy;Vasopneumatic Device;Moist Heat;Electrical Stimulation;Scar mobilization   PT Next Visit Plan Continue with scapular retraction in sitting and prone; continue with PROM and grade 1-2 joint mobs for pain in supine per protocol; supine AAROM flexion, abduction, and ER at side with wand; introduce pulleys as appropriate; heat/ice PRN;       Patient will benefit from skilled therapeutic intervention in order to improve the following deficits and impairments:  Decreased range of motion, Decreased strength, Pain, Impaired UE functional use, Decreased activity tolerance, Decreased scar mobility, Postural dysfunction  Visit Diagnosis: Right shoulder pain, unspecified chronicity  Stiffness of right shoulder, not elsewhere classified  Abnormal posture  Muscle weakness (generalized)     Problem List Patient Active Problem List   Diagnosis Date Noted  . Dyslipidemia (high LDL; low HDL) 08/15/2016  . Complete rotator cuff tear of left shoulder 07/05/2015  . Tennis elbow 05/30/2014  . Rotator cuff (capsule) sprain 03/10/2014  . Right shoulder pain 02/11/2014  . Memory difficulty 12/18/2012  . Gynecomastia, male 07/30/2012  . OSA (obstructive sleep apnea) 04/23/2011  . EXTERNAL HEMORRHOIDS 02/12/2011  . Prostate cancer ([place]) 11/12/2010   . PLANTAR FASCIITIS 04/09/2010  . Hypothyroidism 12/08/2009  . NEOPLASM OF UNCERTAIN BEHAVIOR OF SKIN 03/10/2009  . OSTEOARTHRITIS 02/14/2009  . HEARING DEFICIT 02/08/2009  . Essential hypertension 02/08/2009  . ALLERGIC RHINITIS 02/08/2009  . GERD 02/08/2009  . PROTEINURIA 02/08/2009  . DIVERTICULITIS, HX OF 02/08/2009    Bess Harvest, PTA 01/23/17 12:53 PM  [facility] [address]  [place], [place], [address] Phone: [phone]   Fax:  [phone]  Name: Zachary [name] MRN: [id number] Date of Birth: June 23, 1951

## 2017-01-28 ENCOUNTER — Ambulatory Visit: Payer: 59

## 2017-01-28 DIAGNOSIS — M6281 Muscle weakness (generalized): Secondary | ICD-10-CM | POA: Diagnosis not present

## 2017-01-28 DIAGNOSIS — R293 Abnormal posture: Secondary | ICD-10-CM

## 2017-01-28 DIAGNOSIS — M25611 Stiffness of right shoulder, not elsewhere classified: Secondary | ICD-10-CM | POA: Diagnosis not present

## 2017-01-28 DIAGNOSIS — M25511 Pain in right shoulder: Secondary | ICD-10-CM

## 2017-01-28 NOTE — Therapy (Signed)
[facility] [address]  [name], [place], [address] Phone: [phone]   Fax:  [phone]  Physical Therapy Treatment  Patient Details  Name: Zachary [name] MRN: [id number] Date of Birth: 12-22-1951 Referring Provider: Dr. Corky Mull  Encounter Date: 01/28/2017      PT End of Session - 01/28/17 0804    Visit Number 8   Number of Visits 24   Date for PT Re-Evaluation 03/28/17   PT Start Time 0756  10 min moist heat to begin treatment   PT Stop Time 0854   PT Time Calculation (min) 58 min   Activity Tolerance Patient tolerated treatment well   Behavior During Therapy [facility] for tasks assessed/performed      Past Medical History:  Diagnosis Date  . Arthritis   . Articular cartilage disease    left shoulder  . Cancer ([place]) 2015   positive prostate cancer bx  . Diverticulitis   . Dyslipidemia (high LDL; low HDL) 08/15/2016  . GERD (gastroesophageal reflux disease)   . Hearing problem    hearing deficit  . Hemorrhoids   . Hypertension   . Hypothyroidism   . Sleep apnea    uses a cpap  . Wears glasses   . Wears hearing aid    both ears    Past Surgical History:  Procedure Laterality Date  . BACK SURGERY  1978   herniated disksurgery  . CARDIAC CATHETERIZATION  08/01/2010  . COLONOSCOPY    . EXAM UNDER ANESTHESIA WITH MANIPULATION OF SHOULDER Right 09/15/2014   Procedure: RIGHT SHOULDER MANIPULATION UNDER ANESTHESIA;  Surgeon: Ninetta Lights, MD;  Location: [place];  Service: Orthopedics;  Laterality: Right;  . HEMORRHOID SURGERY  08/2006  . SHOULDER ARTHROSCOPY WITH DISTAL CLAVICLE RESECTION Left 07/07/2015   Procedure: SHOULDER ARTHROSCOPY WITH DISTAL CLAVICLE RESECTION;  Surgeon: Kathryne Hitch, MD;  Location: [place];  Service: Orthopedics;  Laterality: Left;  . SHOULDER ARTHROSCOPY WITH ROTATOR CUFF REPAIR Left 12/07/2015   Procedure: LEFT SHOULDER  ARTHROSCOPY DEBRIDEMENT, WITH ROTATOR CUFF REPAIR;  Surgeon: Ninetta Lights, MD;  Location: [place];  Service: Orthopedics;  Laterality: Left;  . SHOULDER ARTHROSCOPY WITH ROTATOR CUFF REPAIR AND SUBACROMIAL DECOMPRESSION Left 07/07/2015   Procedure: LEFT SHOULDER SCOPE DEBRIDEMENT, SUBACROMIAL DECOMPRESSION, DISTAL CLAVICULECTOMY, ROTATOR CUFF REPAIR  ;  Surgeon: Kathryne Hitch, MD;  Location: [place];  Service: Orthopedics;  Laterality: Left;  ANESTHESIA: GENERAL, PRE/POST OP SCALENE  . SHOULDER ARTHROSCOPY WITH SUBACROMIAL DECOMPRESSION, ROTATOR CUFF REPAIR AND BICEP TENDON REPAIR Right 03/10/2014   Procedure: RIGHT SHOULDER ARTHROSCOPY WITH SUBACROMIAL DECOMPRESSION, PARTIAL ACROMIOPLASTY WITH CORACOAROMIAL LABRUM DEBRIDEMENT RELEASE DISTAL CLAVICULECTOMY,  ROTATOR CUFF REPAIR AND EXTENSIVE DEBRIDEMENT;  Surgeon: Ninetta Lights, MD;  Location: [place];  Service: Orthopedics;  Laterality: Right;  . TENDON REPAIR  June 06, 2011   right elbow, Dr. Percell Miller    There were no vitals filed for this visit.      Subjective Assessment - 01/28/17 0800    Subjective Pt. reporting he is currently pain free however pain still, "shoots up to 7/10 depending on the movements.     Patient Stated Goals "get back to work"    Currently in Pain? Yes   Pain Score 3    Pain Location Shoulder   Pain Orientation Right;Upper   Pain Descriptors / Indicators Sharp   Multiple Pain Sites No  [facility] Adult PT Treatment/Exercise - 01/28/17 0819      Shoulder Exercises: Supine   External Rotation AAROM;Right;15 reps   External Rotation Limitations using cane; 5" holds   Flexion AAROM;Right;15 reps   Flexion Limitations using cane 5" holds;   ABduction AAROM;Right;15 reps   ABduction Limitations in scapular plane; using cane; 5" holds     Shoulder Exercises: Pulleys   Flexion 3 minutes   Flexion Limitations Verbal cues provided for R shoulder  relaxation   ABduction 3 minutes   ABduction Limitations verbal cues provided for R shoulder relaxation     Shoulder Exercises: Isometric Strengthening   Flexion 5X5"   Flexion Limitations standing into doorframe   Extension 5X5"   Extension Limitations standing into doorframe   External Rotation Supine;5X5"   External Rotation Limitations with therapist resistance; at neutral   Internal Rotation Supine;5X5"  arm in neutral; therapist resistance    ABduction 5X5"     Modalities   Modalities Moist Heat;Vasopneumatic     Moist Heat Therapy   Number Minutes Moist Heat 10 Minutes   Moist Heat Location Shoulder     Vasopneumatic   Number Minutes Vasopneumatic  10 minutes   Vasopnuematic Location  Shoulder   Vasopneumatic Pressure Low   Vasopneumatic Temperature  Coldest temperature     Manual Therapy   Manual Therapy Passive ROM;Soft tissue mobilization;Joint mobilization   Joint Mobilization Grade 1-2 inferior glide with arm at side in neutral   Soft tissue mobilization R UT, and teres minor area   Passive ROM R shoulder flexion, ER at side, & abduction without rotation to pt tolerance and within protocol limitations            PT Education - 01/28/17 0844    Education provided Yes   Education Details Supine wand ER, flexion   Person(s) Educated Patient   Methods Explanation;Demonstration;Handout   Comprehension Verbalized understanding;Returned demonstration;Need further instruction          PT Short Term Goals - 01/23/17 0951      PT SHORT TERM GOAL #1   Title R shoulder PROM forward flexion to 140, ER at side to 40, & ABD without rotation to 80 by 01/24/17    Status Achieved     PT SHORT TERM GOAL #2   Title independent with basic HEP by 01/17/17   Status Achieved           PT Long Term Goals - 01/02/17 0808      PT LONG TERM GOAL #1   Title R shoulder AROM WFL by 03/28/17   Status On-going     PT LONG TERM GOAL #2   Title pt is able to return to all  ADLs, chores, and work duties without limitation by shoulder pain, LOM, or weakness by 03/28/17.   Status On-going     PT LONG TERM GOAL #3   Title pt able to sleep without limitation by shoulder pain by 03/28/17   Status On-going     PT LONG TERM GOAL #4   Title R shoulder MMT >/= to 4/5 by 03/28/17   Status On-going               Plan - 01/28/17 0805    Clinical Impression Statement Pt. still reporting benefit from heat to start treatment and ice/compression to conclude treatment thus this continued today.  Isometrics into supine and into doorframe added today and were tolerated well.  Pt. tolerating initiation of pulleys today well without  excessive muscular guarding.  Wand ER and wand flexion added to HEP today and issued to pt. via handout due to pt. able to perform without substitution.  Pt. progressing well at this point per protocol.  Pt. will continue to benefit from further skilled therapy to improve R shoulder strength, ROM, and functional capacity.     PT Treatment/Interventions Patient/family education;ADLs/Self Care Home Management;Passive range of motion;Manual techniques;Therapeutic exercise;Therapeutic activities;Cryotherapy;Vasopneumatic Device;Moist Heat;Electrical Stimulation;Scar mobilization   PT Next Visit Plan Continue isometrics and pulleys per tolerance; Continue with scapular retraction in sitting and prone; continue with PROM and grade 1-2 joint mobs for pain in supine per protocol; supine AAROM flexion, abduction, and ER at side with wand; heat/ice PRN;       Patient will benefit from skilled therapeutic intervention in order to improve the following deficits and impairments:  Decreased range of motion, Decreased strength, Pain, Impaired UE functional use, Decreased activity tolerance, Decreased scar mobility, Postural dysfunction  Visit Diagnosis: Right shoulder pain, unspecified chronicity  Stiffness of right shoulder, not elsewhere classified  Abnormal  posture  Muscle weakness (generalized)     Problem List Patient Active Problem List   Diagnosis Date Noted  . Dyslipidemia (high LDL; low HDL) 08/15/2016  . Complete rotator cuff tear of left shoulder 07/05/2015  . Tennis elbow 05/30/2014  . Rotator cuff (capsule) sprain 03/10/2014  . Right shoulder pain 02/11/2014  . Memory difficulty 12/18/2012  . Gynecomastia, male 07/30/2012  . OSA (obstructive sleep apnea) 04/23/2011  . EXTERNAL HEMORRHOIDS 02/12/2011  . Prostate cancer ([place]) 11/12/2010  . PLANTAR FASCIITIS 04/09/2010  . Hypothyroidism 12/08/2009  . NEOPLASM OF UNCERTAIN BEHAVIOR OF SKIN 03/10/2009  . OSTEOARTHRITIS 02/14/2009  . HEARING DEFICIT 02/08/2009  . Essential hypertension 02/08/2009  . ALLERGIC RHINITIS 02/08/2009  . GERD 02/08/2009  . PROTEINURIA 02/08/2009  . DIVERTICULITIS, HX OF 02/08/2009    Bess Harvest, PTA 01/28/17 10:22 AM  [facility] [address]  [place], [place], [address] Phone: [phone]   Fax:  [phone]  Name: Zachary [name] MRN: [id number] Date of Birth: January 11, 1951

## 2017-01-30 ENCOUNTER — Ambulatory Visit: Payer: 59 | Admitting: Physical Therapy

## 2017-01-30 DIAGNOSIS — R293 Abnormal posture: Secondary | ICD-10-CM

## 2017-01-30 DIAGNOSIS — M25511 Pain in right shoulder: Secondary | ICD-10-CM

## 2017-01-30 DIAGNOSIS — M25611 Stiffness of right shoulder, not elsewhere classified: Secondary | ICD-10-CM

## 2017-01-30 DIAGNOSIS — M6281 Muscle weakness (generalized): Secondary | ICD-10-CM | POA: Diagnosis not present

## 2017-01-30 NOTE — Therapy (Signed)
[facility] [address]  [place], [place], [address] Phone: [phone]   Fax:  [phone]  Physical Therapy Treatment  Patient Details  Name: Zachary [name] MRN: [id number] Date of Birth: 1951/09/29 Referring Provider: Dr. Corky Mull  Encounter Date: 01/30/2017      PT End of Session - 01/30/17 0807    Visit Number 9   Number of Visits 24   Date for PT Re-Evaluation 03/28/17   PT Start Time 0800   PT Stop Time 0900   PT Time Calculation (min) 60 min   Activity Tolerance Patient tolerated treatment well   Behavior During Therapy [facility] for tasks assessed/performed      Past Medical History:  Diagnosis Date  . Arthritis   . Articular cartilage disease    left shoulder  . Cancer ([place]) 2015   positive prostate cancer bx  . Diverticulitis   . Dyslipidemia (high LDL; low HDL) 08/15/2016  . GERD (gastroesophageal reflux disease)   . Hearing problem    hearing deficit  . Hemorrhoids   . Hypertension   . Hypothyroidism   . Sleep apnea    uses a cpap  . Wears glasses   . Wears hearing aid    both ears    Past Surgical History:  Procedure Laterality Date  . BACK SURGERY  1978   herniated disksurgery  . CARDIAC CATHETERIZATION  08/01/2010  . COLONOSCOPY    . EXAM UNDER ANESTHESIA WITH MANIPULATION OF SHOULDER Right 09/15/2014   Procedure: RIGHT SHOULDER MANIPULATION UNDER ANESTHESIA;  Surgeon: Ninetta Lights, MD;  Location: [place];  Service: Orthopedics;  Laterality: Right;  . HEMORRHOID SURGERY  08/2006  . SHOULDER ARTHROSCOPY WITH DISTAL CLAVICLE RESECTION Left 07/07/2015   Procedure: SHOULDER ARTHROSCOPY WITH DISTAL CLAVICLE RESECTION;  Surgeon: Kathryne Hitch, MD;  Location: [place];  Service: Orthopedics;  Laterality: Left;  . SHOULDER ARTHROSCOPY WITH ROTATOR CUFF REPAIR Left 12/07/2015   Procedure: LEFT SHOULDER ARTHROSCOPY DEBRIDEMENT, WITH ROTATOR CUFF  REPAIR;  Surgeon: Ninetta Lights, MD;  Location: [place];  Service: Orthopedics;  Laterality: Left;  . SHOULDER ARTHROSCOPY WITH ROTATOR CUFF REPAIR AND SUBACROMIAL DECOMPRESSION Left 07/07/2015   Procedure: LEFT SHOULDER SCOPE DEBRIDEMENT, SUBACROMIAL DECOMPRESSION, DISTAL CLAVICULECTOMY, ROTATOR CUFF REPAIR  ;  Surgeon: Kathryne Hitch, MD;  Location: [facility];  Service: Orthopedics;  Laterality: Left;  ANESTHESIA: GENERAL, PRE/POST OP SCALENE  . SHOULDER ARTHROSCOPY WITH SUBACROMIAL DECOMPRESSION, ROTATOR CUFF REPAIR AND BICEP TENDON REPAIR Right 03/10/2014   Procedure: RIGHT SHOULDER ARTHROSCOPY WITH SUBACROMIAL DECOMPRESSION, PARTIAL ACROMIOPLASTY WITH CORACOAROMIAL LABRUM DEBRIDEMENT RELEASE DISTAL CLAVICULECTOMY,  ROTATOR CUFF REPAIR AND EXTENSIVE DEBRIDEMENT;  Surgeon: Ninetta Lights, MD;  Location: [place];  Service: Orthopedics;  Laterality: Right;  . TENDON REPAIR  June 06, 2011   right elbow, Dr. Percell Miller    There were no vitals filed for this visit.      Subjective Assessment - 01/30/17 0803    Subjective Pt reports shoulder is not good, he had to leave early from work yesterday due to the pain. The pain started at about 9 am, he took tylenol and that did not help. He used ice when he got home to try and calm it down. He believes it is weather related.   Patient Stated Goals "get back to work"    Currently in Pain? Yes   Pain Score 8    Pain Location Shoulder  Pain Orientation Right;Upper   Pain Descriptors / Indicators Sharp   Pain Type Surgical pain   Pain Radiating Towards goes down the arm towards the elbow   Pain Onset More than a month ago   Pain Frequency Intermittent   Aggravating Factors  Weather, certain movements   Pain Relieving Factors putting the sling back on and relaxing it, tylenol   Effect of Pain on Daily Activities bothers during sleep                         [facility] Adult PT  Treatment/Exercise - 01/30/17 0807      Shoulder Exercises: Supine   External Rotation AAROM;Right;15 reps   External Rotation Limitations using cane; 5" holds   Flexion AAROM;Right;15 reps   Flexion Limitations using cane; 5" holds   ABduction AAROM;Right;15 reps   ABduction Limitations in scapular plane; using cane; 5" holds     Shoulder Exercises: Seated   Retraction AROM;Both;5 reps   Retraction Limitations 10" holds   Abduction AAROM;Right;5 reps   ABduction Limitations Using cane; Unable to keep body straight and shoulder depressed     Shoulder Exercises: Prone   Retraction Right;5 reps   Retraction Limitations 10" holds; right arm off edge of table     Shoulder Exercises: Standing   Row Both;10 reps;Theraband   Theraband Level (Shoulder Row) Level 1 (Yellow)   Row Limitations 5" holds     Shoulder Exercises: Pulleys   Flexion 3 minutes   Flexion Limitations Verbal cues provided for R shoulder relaxation   ABduction 3 minutes   ABduction Limitations verbal cues provided for R shoulder relaxation     Moist Heat Therapy   Number Minutes Moist Heat 10 Minutes   Moist Heat Location Shoulder     Vasopneumatic   Number Minutes Vasopneumatic  10 minutes   Vasopnuematic Location  Shoulder   Vasopneumatic Pressure Low   Vasopneumatic Temperature  Coldest Temperature     Manual Therapy   Manual Therapy Passive ROM;Soft tissue mobilization;Joint mobilization   Joint Mobilization Grade 1-2 inferior glide with arm at side in neutral   Soft tissue mobilization RTC and pec muscles   Passive ROM R shoulder flexion, ER at side, & abduction to pt tolerance and within protocol limitations                PT Education - 01/30/17 0914    Education provided Yes   Education Details update HEP- yellow TB for rows   Person(s) Educated Patient   Methods Explanation;Demonstration;Handout   Comprehension Verbalized understanding;Returned demonstration          PT Short  Term Goals - 01/23/17 0951      PT SHORT TERM GOAL #1   Title R shoulder PROM forward flexion to 140, ER at side to 40, & ABD without rotation to 80 by 01/24/17    Status Achieved     PT SHORT TERM GOAL #2   Title independent with basic HEP by 01/17/17   Status Achieved           PT Long Term Goals - 01/02/17 0808      PT LONG TERM GOAL #1   Title R shoulder AROM WFL by 03/28/17   Status On-going     PT LONG TERM GOAL #2   Title pt is able to return to all ADLs, chores, and work duties without limitation by shoulder pain, LOM, or weakness by 03/28/17.   Status  On-going     PT LONG TERM GOAL #3   Title pt able to sleep without limitation by shoulder pain by 03/28/17   Status On-going     PT LONG TERM GOAL #4   Title R shoulder MMT >/= to 4/5 by 03/28/17   Status On-going               Plan - 01/30/17 0807    Clinical Impression Statement Pt continues to increase tolerance to R shoulder PROM with improvement in shoulder stiffness and pain during treatment. Pt was able to continue with pulleys and wand exercises with correct form but does need to be reminded to keep shoulder depressed during activity. Prone retraction was introduced to patient today. Pt was able to perform the exercises correctly once he knew the correct motion. He did report increased pain following this exercises. Pt was able to tolerate treatment well with no increased pain following today's session. Treatment was started with a moist heat pack to relax the shoulder and concluded with ice/compression to help decrease any pain from today's treatment. Pt will continue to benefit from skilled therapy to improve R shoulder strength, ROM, and R UE function.   Rehab Potential Good   Clinical Impairments Affecting Rehab Potential history of multiple bilateral RTC surgeries; history of R elbow surgery   PT Treatment/Interventions Patient/family education;ADLs/Self Care Home Management;Passive range of motion;Manual  techniques;Therapeutic exercise;Therapeutic activities;Cryotherapy;Vasopneumatic Device;Moist Heat;Electrical Stimulation;Scar mobilization   PT Next Visit Plan 10th visit FOTO; Continue isometrics and pulleys per tolerance; Continue with scapular retraction in sitting and prone; continue with PROM and joint mobs for pain and range of motion in supine per protocol; SA punches in supine; supine AAROM flexion, abduction, and ER at side with wand; heat/ice PRN;    Consulted and Agree with Plan of Care Patient      Patient will benefit from skilled therapeutic intervention in order to improve the following deficits and impairments:  Decreased range of motion, Decreased strength, Pain, Impaired UE functional use, Decreased activity tolerance, Decreased scar mobility, Postural dysfunction  Visit Diagnosis: Right shoulder pain, unspecified chronicity  Stiffness of right shoulder, not elsewhere classified  Abnormal posture  Muscle weakness (generalized)     Problem List Patient Active Problem List   Diagnosis Date Noted  . Dyslipidemia (high LDL; low HDL) 08/15/2016  . Complete rotator cuff tear of left shoulder 07/05/2015  . Tennis elbow 05/30/2014  . Rotator cuff (capsule) sprain 03/10/2014  . Right shoulder pain 02/11/2014  . Memory difficulty 12/18/2012  . Gynecomastia, male 07/30/2012  . OSA (obstructive sleep apnea) 04/23/2011  . EXTERNAL HEMORRHOIDS 02/12/2011  . Prostate cancer ([place]) 11/12/2010  . PLANTAR FASCIITIS 04/09/2010  . Hypothyroidism 12/08/2009  . NEOPLASM OF UNCERTAIN BEHAVIOR OF SKIN 03/10/2009  . OSTEOARTHRITIS 02/14/2009  . HEARING DEFICIT 02/08/2009  . Essential hypertension 02/08/2009  . ALLERGIC RHINITIS 02/08/2009  . GERD 02/08/2009  . PROTEINURIA 02/08/2009  . DIVERTICULITIS, HX OF 02/08/2009    Lauralee Evener, SPT 01/30/2017, 9:32 AM  [facility] [address]  [place], [place],  [address] Phone: [phone]   Fax:  [phone]  Name: Zachary [name] MRN: [id number] Date of Birth: 08-04-1951

## 2017-02-03 MED FILL — HYDROCORTISONE 2.5% CREAM: 2.5 | 30 days supply | Qty: 30 | Fill #1

## 2017-02-04 ENCOUNTER — Ambulatory Visit: Payer: 59

## 2017-02-04 DIAGNOSIS — R293 Abnormal posture: Secondary | ICD-10-CM

## 2017-02-04 DIAGNOSIS — M25511 Pain in right shoulder: Secondary | ICD-10-CM

## 2017-02-04 DIAGNOSIS — M25611 Stiffness of right shoulder, not elsewhere classified: Secondary | ICD-10-CM | POA: Diagnosis not present

## 2017-02-04 DIAGNOSIS — M6281 Muscle weakness (generalized): Secondary | ICD-10-CM

## 2017-02-04 NOTE — Therapy (Signed)
[facility] [address]  [place], [place], [address] Phone: [phone]   Fax:  [phone]  Physical Therapy Treatment  Patient Details  Name: Zachary [name] MRN: [id number] Date of Birth: [birth year] Referring Provider: Dr. Corky Mull  Encounter Date: 02/04/2017      PT End of Session - 02/04/17 0806    Visit Number 10   Number of Visits 24   Date for PT Re-Evaluation 03/28/17   PT Start Time 0759  10 min moist heat to start treatment   PT Stop Time 0902   PT Time Calculation (min) 63 min   Activity Tolerance Patient tolerated treatment well   Behavior During Therapy [facility] for tasks assessed/performed      Past Medical History:  Diagnosis Date  . Arthritis   . Articular cartilage disease    left shoulder  . Cancer ([place]) 2015   positive prostate cancer bx  . Diverticulitis   . Dyslipidemia (high LDL; low HDL) 08/15/2016  . GERD (gastroesophageal reflux disease)   . Hearing problem    hearing deficit  . Hemorrhoids   . Hypertension   . Hypothyroidism   . Sleep apnea    uses a cpap  . Wears glasses   . Wears hearing aid    both ears    Past Surgical History:  Procedure Laterality Date  . BACK SURGERY  1978   herniated disksurgery  . CARDIAC CATHETERIZATION  08/01/2010  . COLONOSCOPY    . EXAM UNDER ANESTHESIA WITH MANIPULATION OF SHOULDER Right 09/15/2014   Procedure: RIGHT SHOULDER MANIPULATION UNDER ANESTHESIA;  Surgeon: Ninetta Lights, MD;  Location: [place];  Service: Orthopedics;  Laterality: Right;  . HEMORRHOID SURGERY  08/2006  . SHOULDER ARTHROSCOPY WITH DISTAL CLAVICLE RESECTION Left 07/07/2015   Procedure: SHOULDER ARTHROSCOPY WITH DISTAL CLAVICLE RESECTION;  Surgeon: Kathryne Hitch, MD;  Location: [facility];  Service: Orthopedics;  Laterality: Left;  . SHOULDER ARTHROSCOPY WITH ROTATOR CUFF REPAIR Left 12/07/2015   Procedure: LEFT SHOULDER  ARTHROSCOPY DEBRIDEMENT, WITH ROTATOR CUFF REPAIR;  Surgeon: Ninetta Lights, MD;  Location: [place];  Service: Orthopedics;  Laterality: Left;  . SHOULDER ARTHROSCOPY WITH ROTATOR CUFF REPAIR AND SUBACROMIAL DECOMPRESSION Left 07/07/2015   Procedure: LEFT SHOULDER SCOPE DEBRIDEMENT, SUBACROMIAL DECOMPRESSION, DISTAL CLAVICULECTOMY, ROTATOR CUFF REPAIR  ;  Surgeon: Kathryne Hitch, MD;  Location: [place];  Service: Orthopedics;  Laterality: Left;  ANESTHESIA: GENERAL, PRE/POST OP SCALENE  . SHOULDER ARTHROSCOPY WITH SUBACROMIAL DECOMPRESSION, ROTATOR CUFF REPAIR AND BICEP TENDON REPAIR Right 03/10/2014   Procedure: RIGHT SHOULDER ARTHROSCOPY WITH SUBACROMIAL DECOMPRESSION, PARTIAL ACROMIOPLASTY WITH CORACOAROMIAL LABRUM DEBRIDEMENT RELEASE DISTAL CLAVICULECTOMY,  ROTATOR CUFF REPAIR AND EXTENSIVE DEBRIDEMENT;  Surgeon: Ninetta Lights, MD;  Location: [facility];  Service: Orthopedics;  Laterality: Right;  . TENDON REPAIR  June 06, 2011   right elbow, Dr. Percell Miller    There were no vitals filed for this visit.      Subjective Assessment - 02/04/17 0803    Subjective Pt. reporting falling down stairs at home on 2.8.18 landing on R shoulder.  Pt. reporting severe pain in R shoulder following this.  Pt. discussed fall with MD on phone on 2.9.18 with MD instructing pt. to monitor R shoulder status and f/u if severe pain levels continue.     Patient Stated Goals "get back to work"    Currently in Pain? Yes   Pain Score  6    Pain Location Shoulder   Pain Orientation Right;Upper   Pain Descriptors / Indicators Sharp   Pain Type Surgical pain   Pain Radiating Towards goes down the arm towards the elbow   Pain Onset More than a month ago   Pain Frequency Intermittent   Aggravating Factors  Certain movements   Pain Relieving Factors tylenol    Effect of Pain on Daily Activities waking up frequently in night    Multiple Pain Sites No            [facility]  PT Assessment - 02/04/17 0942      Observation/Other Assessments   Focus on Therapeutic Outcomes (FOTO)  50% (50% limitation)             [facility] Adult PT Treatment/Exercise - 02/04/17 0814      Shoulder Exercises: Supine   External Rotation AAROM;Right;20 reps  Confirming no abnormal pain increase    External Rotation Limitations using cane; 5" holds   Flexion AAROM;Right;20 reps  confirming no abnormal pain increase    Flexion Limitations using cane; 5" holds   ABduction AAROM;Right;20 reps  confirming no abnormal pain increase    ABduction Limitations in scapular plane; using cane; 5" holds     Shoulder Exercises: Seated   Retraction AROM;Both;10 reps  HEP review    Retraction Limitations 10" holds   Abduction AAROM;Right;10 reps  HEP review    ABduction Limitations Using cane; therapist tactile cueing to keep scap. depressed; mirror feedback used     Shoulder Exercises: Prone   Retraction 10 reps   Retraction Limitations 10" holds; right arm off edge of table     Shoulder Exercises: Standing   Row Both;10 reps;Theraband   Theraband Level (Shoulder Row) Level 1 (Yellow)  HEP review   Row Limitations 5" hold      Shoulder Exercises: Pulleys   Flexion --   Flexion Limitations --   ABduction --   ABduction Limitations --     Shoulder Exercises: Isometric Strengthening   Flexion 5X5"   Flexion Limitations standing into doorframe   Extension 5X5"   Extension Limitations standing into doorframe   External Rotation 5X5"   External Rotation Limitations Standing into doorframe   Internal Rotation 5X5"  standing pushing into doorframe   ABduction 5X5"   ABduction Limitations Standing into door     Moist Heat Therapy   Number Minutes Moist Heat 10 Minutes   Moist Heat Location Shoulder  R; supine      Manual Therapy   Manual Therapy Passive ROM;Joint mobilization   Joint Mobilization Grade 1-2 inferior glide with arm at side in neutral   Passive ROM R  shoulder flexion, ER at side, & abduction to pt tolerance and within protocol limitations           PT Short Term Goals - 01/23/17 0951      PT SHORT TERM GOAL #1   Title R shoulder PROM forward flexion to 140, ER at side to 40, & ABD without rotation to 80 by 01/24/17    Status Achieved     PT SHORT TERM GOAL #2   Title independent with basic HEP by 01/17/17   Status Achieved           PT Long Term Goals - 01/02/17 0808      PT LONG TERM GOAL #1   Title R shoulder AROM WFL by 03/28/17   Status On-going     PT LONG TERM  GOAL #2   Title pt is able to return to all ADLs, chores, and work duties without limitation by shoulder pain, LOM, or weakness by 03/28/17.   Status On-going     PT LONG TERM GOAL #3   Title pt able to sleep without limitation by shoulder pain by 03/28/17   Status On-going     PT LONG TERM GOAL #4   Title R shoulder MMT >/= to 4/5 by 03/28/17   Status On-going               Plan - 02/04/17 0807    Clinical Impression Statement Pt. reporting tripping and falling down stairs and landing on R shoulder on 2.8.18.  Pt. reporting severe pain for two days after fall and discussed fall with MD over phone on 2.9.18.  MD instructing pt. to f/u if severe pain did not decrease over weekend.  Pt. seen initially today with report of R shoulder pain decreased since fall and returning to baseline pain level.  Pt. confirming R shoulder with typical response to therex activities, without abnormal increase in pain, with today's treatment.  Today's treatment with mild progression in reps and hold times for prone retraction and wand activities.  Tactile cueing still required today to prevent scapular elevation with wand abduction.  Moist heat continued today to promote muscular relaxation to start treatment with ice/compression applied to conclude treatment for pain reduction.  Pt. will continue to benefit from further skilled therapy to improve R shoulder ROM, strength, and  functional capacity per protocol limitations.   PT Treatment/Interventions Patient/family education;ADLs/Self Care Home Management;Passive range of motion;Manual techniques;Therapeutic exercise;Therapeutic activities;Cryotherapy;Vasopneumatic Device;Moist Heat;Electrical Stimulation;Scar mobilization   PT Next Visit Plan Continue isometrics and pulleys per tolerance; Continue with scapular retraction in sitting and prone; continue with PROM and joint mobs for pain and range of motion in supine per protocol; SA punches in supine; supine AAROM flexion, abduction, and ER at side with wand; heat/ice PRN;       Patient will benefit from skilled therapeutic intervention in order to improve the following deficits and impairments:  Decreased range of motion, Decreased strength, Pain, Impaired UE functional use, Decreased activity tolerance, Decreased scar mobility, Postural dysfunction  Visit Diagnosis: Right shoulder pain, unspecified chronicity  Stiffness of right shoulder, not elsewhere classified  Abnormal posture  Muscle weakness (generalized)     Problem List Patient Active Problem List   Diagnosis Date Noted  . Dyslipidemia (high LDL; low HDL) 08/15/2016  . Complete rotator cuff tear of left shoulder 07/05/2015  . Tennis elbow 05/30/2014  . Rotator cuff (capsule) sprain 03/10/2014  . Right shoulder pain 02/11/2014  . Memory difficulty 12/18/2012  . Gynecomastia, male 07/30/2012  . OSA (obstructive sleep apnea) 04/23/2011  . EXTERNAL HEMORRHOIDS 02/12/2011  . Prostate cancer ([place]) 11/12/2010  . PLANTAR FASCIITIS 04/09/2010  . Hypothyroidism 12/08/2009  . NEOPLASM OF UNCERTAIN BEHAVIOR OF SKIN 03/10/2009  . OSTEOARTHRITIS 02/14/2009  . HEARING DEFICIT 02/08/2009  . Essential hypertension 02/08/2009  . ALLERGIC RHINITIS 02/08/2009  . GERD 02/08/2009  . PROTEINURIA 02/08/2009  . DIVERTICULITIS, HX OF 02/08/2009    Bess Harvest, PTA 02/04/17 10:13 AM   [facility] [address]  [place], [place], [address] Phone: [phone]   Fax:  [phone]  Name: HURL RO MRN: [id number] Date of Birth: 02/11/1951

## 2017-02-06 ENCOUNTER — Ambulatory Visit: Payer: 59 | Admitting: Physical Therapy

## 2017-02-06 DIAGNOSIS — M25611 Stiffness of right shoulder, not elsewhere classified: Secondary | ICD-10-CM

## 2017-02-06 DIAGNOSIS — M6281 Muscle weakness (generalized): Secondary | ICD-10-CM

## 2017-02-06 DIAGNOSIS — R293 Abnormal posture: Secondary | ICD-10-CM

## 2017-02-06 DIAGNOSIS — M25511 Pain in right shoulder: Secondary | ICD-10-CM | POA: Diagnosis not present

## 2017-02-06 NOTE — Therapy (Signed)
[facility] [address]  [place], [place], [address] Phone: [phone]   Fax:  [phone]  Physical Therapy Treatment  Patient Details  Name: Zachary [name] MRN: [id number] Date of Birth: 18-Sep-1951 Referring Provider: Dr. Corky Mull  Encounter Date: 02/06/2017      PT End of Session - 02/06/17 0808    Visit Number 11   Number of Visits 24   Date for PT Re-Evaluation 03/28/17   PT Start Time 0803   PT Stop Time 0903   PT Time Calculation (min) 60 min   Activity Tolerance Patient tolerated treatment well;No increased pain   Behavior During Therapy WFL for tasks assessed/performed      Past Medical History:  Diagnosis Date  . Arthritis   . Articular cartilage disease    left shoulder  . Cancer ([place]) 2015   positive prostate cancer bx  . Diverticulitis   . Dyslipidemia (high LDL; low HDL) 08/15/2016  . GERD (gastroesophageal reflux disease)   . Hearing problem    hearing deficit  . Hemorrhoids   . Hypertension   . Hypothyroidism   . Sleep apnea    uses a cpap  . Wears glasses   . Wears hearing aid    both ears    Past Surgical History:  Procedure Laterality Date  . BACK SURGERY  1978   herniated disksurgery  . CARDIAC CATHETERIZATION  08/01/2010  . COLONOSCOPY    . EXAM UNDER ANESTHESIA WITH MANIPULATION OF SHOULDER Right 09/15/2014   Procedure: RIGHT SHOULDER MANIPULATION UNDER ANESTHESIA;  Surgeon: Ninetta Lights, MD;  Location: [place];  Service: Orthopedics;  Laterality: Right;  . HEMORRHOID SURGERY  08/2006  . SHOULDER ARTHROSCOPY WITH DISTAL CLAVICLE RESECTION Left 07/07/2015   Procedure: SHOULDER ARTHROSCOPY WITH DISTAL CLAVICLE RESECTION;  Surgeon: Kathryne Hitch, MD;  Location: [facility];  Service: Orthopedics;  Laterality: Left;  . SHOULDER ARTHROSCOPY WITH ROTATOR CUFF REPAIR Left 12/07/2015   Procedure: LEFT SHOULDER ARTHROSCOPY DEBRIDEMENT, WITH  ROTATOR CUFF REPAIR;  Surgeon: Ninetta Lights, MD;  Location: [facility];  Service: Orthopedics;  Laterality: Left;  . SHOULDER ARTHROSCOPY WITH ROTATOR CUFF REPAIR AND SUBACROMIAL DECOMPRESSION Left 07/07/2015   Procedure: LEFT SHOULDER SCOPE DEBRIDEMENT, SUBACROMIAL DECOMPRESSION, DISTAL CLAVICULECTOMY, ROTATOR CUFF REPAIR  ;  Surgeon: Kathryne Hitch, MD;  Location: [place];  Service: Orthopedics;  Laterality: Left;  ANESTHESIA: GENERAL, PRE/POST OP SCALENE  . SHOULDER ARTHROSCOPY WITH SUBACROMIAL DECOMPRESSION, ROTATOR CUFF REPAIR AND BICEP TENDON REPAIR Right 03/10/2014   Procedure: RIGHT SHOULDER ARTHROSCOPY WITH SUBACROMIAL DECOMPRESSION, PARTIAL ACROMIOPLASTY WITH CORACOAROMIAL LABRUM DEBRIDEMENT RELEASE DISTAL CLAVICULECTOMY,  ROTATOR CUFF REPAIR AND EXTENSIVE DEBRIDEMENT;  Surgeon: Ninetta Lights, MD;  Location: [facility];  Service: Orthopedics;  Laterality: Right;  . TENDON REPAIR  June 06, 2011   right elbow, Dr. Percell Miller    There were no vitals filed for this visit.      Subjective Assessment - 02/06/17 0804    Subjective Pt reporting R shoulder is sore. He does not report it is feeling different after falling down the stairs.    Patient Stated Goals "get back to work"    Currently in Pain? Yes   Pain Score 4    Pain Location Shoulder   Pain Orientation Right;Upper                         [facility]  Adult PT Treatment/Exercise - 02/06/17 0807      Shoulder Exercises: Supine   External Rotation AAROM;Right;20 reps   External Rotation Limitations using cane; 5" holds     Shoulder Exercises: Seated   Retraction AROM;Both;10 reps   Retraction Limitations 10" holds     Shoulder Exercises: Pulleys   Flexion 3 minutes   ABduction 3 minutes   ABduction Limitations verbal cues provided for R shoulder relaxation     Modalities   Modalities Electrical Stimulation     Moist Heat Therapy   Number Minutes Moist Heat 10  Minutes   Moist Heat Location Shoulder  R     Electrical Stimulation   Electrical Stimulation Location R Shoulder   Electrical Stimulation Action IFC   Electrical Stimulation Parameters 80-150 Hz; intensity to pt tolerance x 10'    Electrical Stimulation Goals Pain     Vasopneumatic   Number Minutes Vasopneumatic  10 minutes   Vasopnuematic Location  Shoulder   Vasopneumatic Pressure Low   Vasopneumatic Temperature  Coldest temperature     Manual Therapy   Joint Mobilization Inferior glide with arm at side in neutral    Soft tissue mobilization RTC and pec muscles   Passive ROM R shoulder flexion, ER at side, & abduction to pt tolerance and within protocol limitations                PT Education - 02/06/17 0910    Education provided Yes   Education Details Update HEP with pulley exercises   Person(s) Educated Patient   Methods Explanation;Demonstration;Handout   Comprehension Verbalized understanding;Returned demonstration          PT Short Term Goals - 01/23/17 0951      PT SHORT TERM GOAL #1   Title R shoulder PROM forward flexion to 140, ER at side to 40, & ABD without rotation to 80 by 01/24/17    Status Achieved     PT SHORT TERM GOAL #2   Title independent with basic HEP by 01/17/17   Status Achieved           PT Long Term Goals - 01/02/17 0808      PT LONG TERM GOAL #1   Title R shoulder AROM WFL by 03/28/17   Status On-going     PT LONG TERM GOAL #2   Title pt is able to return to all ADLs, chores, and work duties without limitation by shoulder pain, LOM, or weakness by 03/28/17.   Status On-going     PT LONG TERM GOAL #3   Title pt able to sleep without limitation by shoulder pain by 03/28/17   Status On-going     PT LONG TERM GOAL #4   Title R shoulder MMT >/= to 4/5 by 03/28/17   Status On-going               Plan - 02/06/17 0808    Clinical Impression Statement Pt is reporting the arm is feeling sore as usual. His shoulder is  feeling better since taking a fall down the stairs. Pt was sore following last treatment session. Today's session was focused on progressing R shoulder ROM. He was able to tolerate PROM & joint mobilizations well. He continues to make progress with R shoulder ROM. He does need a verbal reminder to keep the shoulder relaxed before beginning the shoulder abduction pulley exercise. He will begin pulleys at home to continue to progress ROM. He received E-stim today since he reports he  feels more sore following therapy sessions and has a hard time at work after therapy due to pain. Pt will continue to benefit from skilled therapy to improve R shoulder ROM, reduce R shoulder pain, increase R shoulder strength, & improve function of R UE.    Rehab Potential Good   Clinical Impairments Affecting Rehab Potential history of multiple bilateral RTC surgeries; history of R elbow surgery   PT Treatment/Interventions Patient/family education;ADLs/Self Care Home Management;Passive range of motion;Manual techniques;Therapeutic exercise;Therapeutic activities;Cryotherapy;Vasopneumatic Device;Moist Heat;Electrical Stimulation;Scar mobilization   PT Next Visit Plan Assess pt response to E-stim; Continue Isometrics & pulleys per tolerance; continue with scapular retraction in sitting and prone; continue PROm & joint mobs for pain and ROM in supine per protocol; SA punches in supine; progress strengthening per protocol; heat/ice PRN   Consulted and Agree with Plan of Care Patient      Patient will benefit from skilled therapeutic intervention in order to improve the following deficits and impairments:  Decreased range of motion, Decreased strength, Pain, Impaired UE functional use, Decreased activity tolerance, Decreased scar mobility, Postural dysfunction  Visit Diagnosis: Right shoulder pain, unspecified chronicity  Stiffness of right shoulder, not elsewhere classified  Abnormal posture  Muscle weakness  (generalized)     Problem List Patient Active Problem List   Diagnosis Date Noted  . Dyslipidemia (high LDL; low HDL) 08/15/2016  . Complete rotator cuff tear of left shoulder 07/05/2015  . Tennis elbow 05/30/2014  . Rotator cuff (capsule) sprain 03/10/2014  . Right shoulder pain 02/11/2014  . Memory difficulty 12/18/2012  . Gynecomastia, male 07/30/2012  . OSA (obstructive sleep apnea) 04/23/2011  . EXTERNAL HEMORRHOIDS 02/12/2011  . Prostate cancer ([place]) 11/12/2010  . PLANTAR FASCIITIS 04/09/2010  . Hypothyroidism 12/08/2009  . NEOPLASM OF UNCERTAIN BEHAVIOR OF SKIN 03/10/2009  . OSTEOARTHRITIS 02/14/2009  . HEARING DEFICIT 02/08/2009  . Essential hypertension 02/08/2009  . ALLERGIC RHINITIS 02/08/2009  . GERD 02/08/2009  . PROTEINURIA 02/08/2009  . DIVERTICULITIS, HX OF 02/08/2009    Lauralee Evener, SPT 02/06/2017, 9:20 AM  [facility] [address]  [place], [place], [address] Phone: [phone]   Fax:  [phone]  Name: Zachary [name] MRN: [id number] Date of Birth: 1951-05-29

## 2017-02-11 ENCOUNTER — Ambulatory Visit: Payer: 59

## 2017-02-11 DIAGNOSIS — M25511 Pain in right shoulder: Secondary | ICD-10-CM

## 2017-02-11 DIAGNOSIS — M25611 Stiffness of right shoulder, not elsewhere classified: Secondary | ICD-10-CM

## 2017-02-11 DIAGNOSIS — M6281 Muscle weakness (generalized): Secondary | ICD-10-CM | POA: Diagnosis not present

## 2017-02-11 DIAGNOSIS — R293 Abnormal posture: Secondary | ICD-10-CM

## 2017-02-11 NOTE — Therapy (Signed)
[facility] [address]  [place], [place], [address] Phone: [phone]   Fax:  [phone]  Physical Therapy Treatment  Patient Details  Name: Zachary [name] MRN: [id number] Date of Birth: [birth year] Referring Provider: Dr. Corky Mull  Encounter Date: 02/11/2017      PT End of Session - 02/11/17 0806    Visit Number 12   Number of Visits 24   Date for PT Re-Evaluation 03/28/17   PT Start Time 0758  8 min moist heat to start treatment    PT Stop Time 0858   PT Time Calculation (min) 60 min   Activity Tolerance Patient tolerated treatment well;No increased pain   Behavior During Therapy WFL for tasks assessed/performed      Past Medical History:  Diagnosis Date  . Arthritis   . Articular cartilage disease    left shoulder  . Cancer ([place]) 2015   positive prostate cancer bx  . Diverticulitis   . Dyslipidemia (high LDL; low HDL) 08/15/2016  . GERD (gastroesophageal reflux disease)   . Hearing problem    hearing deficit  . Hemorrhoids   . Hypertension   . Hypothyroidism   . Sleep apnea    uses a cpap  . Wears glasses   . Wears hearing aid    both ears    Past Surgical History:  Procedure Laterality Date  . BACK SURGERY  1978   herniated disksurgery  . CARDIAC CATHETERIZATION  08/01/2010  . COLONOSCOPY    . EXAM UNDER ANESTHESIA WITH MANIPULATION OF SHOULDER Right 09/15/2014   Procedure: RIGHT SHOULDER MANIPULATION UNDER ANESTHESIA;  Surgeon: Ninetta Lights, MD;  Location: [facility];  Service: Orthopedics;  Laterality: Right;  . HEMORRHOID SURGERY  08/2006  . SHOULDER ARTHROSCOPY WITH DISTAL CLAVICLE RESECTION Left 07/07/2015   Procedure: SHOULDER ARTHROSCOPY WITH DISTAL CLAVICLE RESECTION;  Surgeon: Kathryne Hitch, MD;  Location: [facility];  Service: Orthopedics;  Laterality: Left;  . SHOULDER ARTHROSCOPY WITH ROTATOR CUFF REPAIR Left 12/07/2015   Procedure: LEFT  SHOULDER ARTHROSCOPY DEBRIDEMENT, WITH ROTATOR CUFF REPAIR;  Surgeon: Ninetta Lights, MD;  Location: [facility];  Service: Orthopedics;  Laterality: Left;  . SHOULDER ARTHROSCOPY WITH ROTATOR CUFF REPAIR AND SUBACROMIAL DECOMPRESSION Left 07/07/2015   Procedure: LEFT SHOULDER SCOPE DEBRIDEMENT, SUBACROMIAL DECOMPRESSION, DISTAL CLAVICULECTOMY, ROTATOR CUFF REPAIR  ;  Surgeon: Kathryne Hitch, MD;  Location: [facility];  Service: Orthopedics;  Laterality: Left;  ANESTHESIA: GENERAL, PRE/POST OP SCALENE  . SHOULDER ARTHROSCOPY WITH SUBACROMIAL DECOMPRESSION, ROTATOR CUFF REPAIR AND BICEP TENDON REPAIR Right 03/10/2014   Procedure: RIGHT SHOULDER ARTHROSCOPY WITH SUBACROMIAL DECOMPRESSION, PARTIAL ACROMIOPLASTY WITH CORACOAROMIAL LABRUM DEBRIDEMENT RELEASE DISTAL CLAVICULECTOMY,  ROTATOR CUFF REPAIR AND EXTENSIVE DEBRIDEMENT;  Surgeon: Ninetta Lights, MD;  Location: [place] [facility];  Service: Orthopedics;  Laterality: Right;  . TENDON REPAIR  June 06, 2011   right elbow, Dr. Percell Miller    There were no vitals filed for this visit.      Subjective Assessment - 02/11/17 0802    Subjective Pt. reporting relief following E-stim applied last treatment and has been using TENS unit at home.     Patient Stated Goals "get back to work"    Currently in Pain? Yes   Pain Score 3    Pain Location Shoulder   Pain Orientation Right;Upper   Pain Descriptors / Indicators Sharp   Pain Type Surgical pain   Pain Radiating Towards  goes down the arm towards the elbow   Pain Onset More than a month ago   Aggravating Factors  Weather, movement, putting on shirt   Pain Relieving Factors E-stim, sling (still using at work)   Effect of Pain on Daily Activities waking up frequently in night    Multiple Pain Sites No            [facility] Adult PT Treatment/Exercise - 02/11/17 0814      Shoulder Exercises: Supine   Protraction AROM;Right;20 reps   Flexion AROM;Right;10 reps    Flexion Limitations Focusing on slow eccentric control      Shoulder Exercises: Prone   Retraction 10 reps   Retraction Limitations 10" holds; right arm off edge of table   Extension AROM;10 reps;Right  5" hold      Shoulder Exercises: Pulleys   Flexion 3 minutes   ABduction 3 minutes   ABduction Limitations --     Shoulder Exercises: Isometric Strengthening   Flexion 5X10"   Flexion Limitations standing into doorframe   Extension 5X10"   Extension Limitations standing into doorframe   External Rotation 5X10"   External Rotation Limitations Standing into doorframe   Internal Rotation 5X10"  pushing into doorframe   ABduction 5X10"  pushing into doorframe   ABduction Limitations Standing into door     Moist Heat Therapy   Number Minutes Moist Heat 8 Minutes   Moist Heat Location Shoulder  R shoulder      Electrical Stimulation   Electrical Stimulation Location R Shoulder   Electrical Stimulation Action IFC    Electrical Stimulation Parameters 80-150Hz ; intensity to pt. tolerance; 10 min    Electrical Stimulation Goals Pain     Vasopneumatic   Number Minutes Vasopneumatic  10 minutes   Vasopnuematic Location  Shoulder   Vasopneumatic Pressure Low   Vasopneumatic Temperature  Coldest temperature     Manual Therapy   Manual Therapy Passive ROM;Joint mobilization   Joint Mobilization Inferior glide with arm at side in neutral    Passive ROM R shoulder flexion, ER at side, & abduction to pt tolerance and within protocol limitations          PT Education - 02/11/17 1155    Education provided Yes   Education Details standing isometrics, flexion, extension, ER, IR, abduction into doorframe   Methods Explanation;Demonstration;Handout   Comprehension Verbalized understanding;Returned demonstration          PT Short Term Goals - 01/23/17 0951      PT SHORT TERM GOAL #1   Title R shoulder PROM forward flexion to 140, ER at side to 40, & ABD without rotation to 80  by 01/24/17    Status Achieved     PT SHORT TERM GOAL #2   Title independent with basic HEP by 01/17/17   Status Achieved           PT Long Term Goals - 01/02/17 0808      PT LONG TERM GOAL #1   Title R shoulder AROM WFL by 03/28/17   Status On-going     PT LONG TERM GOAL #2   Title pt is able to return to all ADLs, chores, and work duties without limitation by shoulder pain, LOM, or weakness by 03/28/17.   Status On-going     PT LONG TERM GOAL #3   Title pt able to sleep without limitation by shoulder pain by 03/28/17   Status On-going     PT LONG TERM GOAL #4  Title R shoulder MMT >/= to 4/5 by 03/28/17   Status On-going               Plan - 02/11/17 0807    Clinical Impression Statement Pt. reporting good relief from E-stim applied last treatment and has been using TENS unit for relief at home.  Serratus punches and supine AROM flexion added today, with isometrics progressed with good tolerance.  Isometrics added to HEP today.  Pt. reports using pulleys at home and able to perform without muscular guarding today in treatment.  Pt. progressing well and will continue to benefit from further skilled therapy to improve R shoulder ROM, strength, and functional capacity.     PT Treatment/Interventions Patient/family education;ADLs/Self Care Home Management;Passive range of motion;Manual techniques;Therapeutic exercise;Therapeutic activities;Cryotherapy;Vasopneumatic Device;Moist Heat;Electrical Stimulation;Scar mobilization   PT Next Visit Plan Continue Isometrics & pulleys per tolerance; continue with scapular retraction in sitting and prone; continue PROM & joint mobs for pain and ROM in supine per protocol; SA punches in supine; progress strengthening per protocol; heat/ice PRN      Patient will benefit from skilled therapeutic intervention in order to improve the following deficits and impairments:  Decreased range of motion, Decreased strength, Pain, Impaired UE functional use,  Decreased activity tolerance, Decreased scar mobility, Postural dysfunction  Visit Diagnosis: Right shoulder pain, unspecified chronicity  Stiffness of right shoulder, not elsewhere classified  Abnormal posture  Muscle weakness (generalized)     Problem List Patient Active Problem List   Diagnosis Date Noted  . Dyslipidemia (high LDL; low HDL) 08/15/2016  . Complete rotator cuff tear of left shoulder 07/05/2015  . Tennis elbow 05/30/2014  . Rotator cuff (capsule) sprain 03/10/2014  . Right shoulder pain 02/11/2014  . Memory difficulty 12/18/2012  . Gynecomastia, male 07/30/2012  . OSA (obstructive sleep apnea) 04/23/2011  . EXTERNAL HEMORRHOIDS 02/12/2011  . Prostate cancer ([place]) 11/12/2010  . PLANTAR FASCIITIS 04/09/2010  . Hypothyroidism 12/08/2009  . NEOPLASM OF UNCERTAIN BEHAVIOR OF SKIN 03/10/2009  . OSTEOARTHRITIS 02/14/2009  . HEARING DEFICIT 02/08/2009  . Essential hypertension 02/08/2009  . ALLERGIC RHINITIS 02/08/2009  . GERD 02/08/2009  . PROTEINURIA 02/08/2009  . DIVERTICULITIS, HX OF 02/08/2009    Bess Harvest, PTA 02/11/17 12:04 PM  [facility] [address]  [place], [place], [address] Phone: [phone]   Fax:  [phone]  Name: EDDEN DEEM MRN: [id number] Date of Birth: 28-Oct-1951

## 2017-02-13 ENCOUNTER — Ambulatory Visit: Payer: 59 | Admitting: Physical Therapy

## 2017-02-13 DIAGNOSIS — R293 Abnormal posture: Secondary | ICD-10-CM

## 2017-02-13 DIAGNOSIS — M6281 Muscle weakness (generalized): Secondary | ICD-10-CM | POA: Diagnosis not present

## 2017-02-13 DIAGNOSIS — M25511 Pain in right shoulder: Secondary | ICD-10-CM | POA: Diagnosis not present

## 2017-02-13 DIAGNOSIS — M25611 Stiffness of right shoulder, not elsewhere classified: Secondary | ICD-10-CM | POA: Diagnosis not present

## 2017-02-13 NOTE — Therapy (Signed)
[facility] [address]  [place], [place], [address] Phone: [phone]   Fax:  [phone]  Physical Therapy Treatment  Patient Details  Name: Zachary [name] MRN: [id number] Date of Birth: 1951/01/23 Referring Provider: Dr. Corky Mull  Encounter Date: 02/13/2017      PT End of Session - 02/13/17 0811    Visit Number 13   Number of Visits 24   Date for PT Re-Evaluation 03/28/17   PT Start Time 0757   PT Stop Time 0841   PT Time Calculation (min) 44 min   Activity Tolerance Patient tolerated treatment well   Behavior During Therapy [facility] for tasks assessed/performed      Past Medical History:  Diagnosis Date  . Arthritis   . Articular cartilage disease    left shoulder  . Cancer ([place]) 2015   positive prostate cancer bx  . Diverticulitis   . Dyslipidemia (high LDL; low HDL) 08/15/2016  . GERD (gastroesophageal reflux disease)   . Hearing problem    hearing deficit  . Hemorrhoids   . Hypertension   . Hypothyroidism   . Sleep apnea    uses a cpap  . Wears glasses   . Wears hearing aid    both ears    Past Surgical History:  Procedure Laterality Date  . BACK SURGERY  1978   herniated disksurgery  . CARDIAC CATHETERIZATION  08/01/2010  . COLONOSCOPY    . EXAM UNDER ANESTHESIA WITH MANIPULATION OF SHOULDER Right 09/15/2014   Procedure: RIGHT SHOULDER MANIPULATION UNDER ANESTHESIA;  Surgeon: Ninetta Lights, MD;  Location: [place];  Service: Orthopedics;  Laterality: Right;  . HEMORRHOID SURGERY  08/2006  . SHOULDER ARTHROSCOPY WITH DISTAL CLAVICLE RESECTION Left 07/07/2015   Procedure: SHOULDER ARTHROSCOPY WITH DISTAL CLAVICLE RESECTION;  Surgeon: Kathryne Hitch, MD;  Location: [place];  Service: Orthopedics;  Laterality: Left;  . SHOULDER ARTHROSCOPY WITH ROTATOR CUFF REPAIR Left 12/07/2015   Procedure: LEFT SHOULDER ARTHROSCOPY DEBRIDEMENT, WITH ROTATOR CUFF  REPAIR;  Surgeon: Ninetta Lights, MD;  Location: [place];  Service: Orthopedics;  Laterality: Left;  . SHOULDER ARTHROSCOPY WITH ROTATOR CUFF REPAIR AND SUBACROMIAL DECOMPRESSION Left 07/07/2015   Procedure: LEFT SHOULDER SCOPE DEBRIDEMENT, SUBACROMIAL DECOMPRESSION, DISTAL CLAVICULECTOMY, ROTATOR CUFF REPAIR  ;  Surgeon: Kathryne Hitch, MD;  Location: [place];  Service: Orthopedics;  Laterality: Left;  ANESTHESIA: GENERAL, PRE/POST OP SCALENE  . SHOULDER ARTHROSCOPY WITH SUBACROMIAL DECOMPRESSION, ROTATOR CUFF REPAIR AND BICEP TENDON REPAIR Right 03/10/2014   Procedure: RIGHT SHOULDER ARTHROSCOPY WITH SUBACROMIAL DECOMPRESSION, PARTIAL ACROMIOPLASTY WITH CORACOAROMIAL LABRUM DEBRIDEMENT RELEASE DISTAL CLAVICULECTOMY,  ROTATOR CUFF REPAIR AND EXTENSIVE DEBRIDEMENT;  Surgeon: Ninetta Lights, MD;  Location: [place];  Service: Orthopedics;  Laterality: Right;  . TENDON REPAIR  June 06, 2011   right elbow, Dr. Percell Miller    There were no vitals filed for this visit.      Subjective Assessment - 02/13/17 0810    Subjective Pt reports his shoulder is feeling stiff. He would like to try no ice following treatment today to see how his shoulder feels throughout the day.    Patient Stated Goals "get back to work"    Currently in Pain? Yes   Pain Score 3    Pain Location Shoulder   Pain Orientation Right;Upper  [facility] Adult PT Treatment/Exercise - 02/13/17 0001      Shoulder Exercises: Seated   External Rotation AAROM;Right;15 reps   External Rotation Limitations Using cane; 3" holds; seated in chair with back   Flexion AAROM;Right;15 reps   Flexion Limitations Using cane; 3" holds; seated in chair with back   Abduction AAROM;Right;15 reps   ABduction Limitations Using cane; 3" holds; seated in chair with back     Shoulder Exercises: Prone   Retraction 10 reps   Retraction Limitations 10" holds; right arm  off edge of table   Extension AROM;10 reps;Right  5" hold      Shoulder Exercises: Standing   Flexion AAROM;Right;10 reps   Flexion Limitations Wall Ladder; 3" hold at top   ABduction AAROM;Right;10 reps   ABduction Limitations Wall ladder; 3" holds at top     Shoulder Exercises: Isometric Strengthening   Flexion Limitations standing into doorframe  10 x 5"   Extension Limitations standing into doorframe  10 x 5"    External Rotation Limitations Standing into doorframe  10 x 5"    Internal Rotation --  10 x 5" ; standing in doorframe   ABduction --  standing in doorframe; 10 x 5"      Manual Therapy   Joint Mobilization Inferior glide with arm at side in neutral  & at 45 degree abduction   Soft tissue mobilization RTC and pec muscles   Passive ROM R shoulder flexion & abduction to pt tolerance and within protocol limitations                  PT Short Term Goals - 01/23/17 0951      PT SHORT TERM GOAL #1   Title R shoulder PROM forward flexion to 140, ER at side to 40, & ABD without rotation to 80 by 01/24/17    Status Achieved     PT SHORT TERM GOAL #2   Title independent with basic HEP by 01/17/17   Status Achieved           PT Long Term Goals - 01/02/17 0808      PT LONG TERM GOAL #1   Title R shoulder AROM WFL by 03/28/17   Status On-going     PT LONG TERM GOAL #2   Title pt is able to return to all ADLs, chores, and work duties without limitation by shoulder pain, LOM, or weakness by 03/28/17.   Status On-going     PT LONG TERM GOAL #3   Title pt able to sleep without limitation by shoulder pain by 03/28/17   Status On-going     PT LONG TERM GOAL #4   Title R shoulder MMT >/= to 4/5 by 03/28/17   Status On-going               Plan - 02/13/17 0841    Clinical Impression Statement Pt is reporting R shoulder is still feeling stiff. He reports he is wanting to try to go without ice today on his shoulder today. Today's session focused on reviewing  his most recent HEP & progressing his AAROM of his shoulder. He was able to tolerate the wall ladder & wand exercises in a seated position well today. He does continue to have shoulder elevation with overhead movements. He reports the isometric exercises in the doorway are painful so pt was instructed to not push as hard into the doorframe & stop before he is feeling increased pain. He reported no increased  pain following joint mobilization & soft tissue mobilization of the R shoulder. He will continue to benefit from skilled therapy to reduce R shoulder pain, increase R shoulder ROM, & increase R shoulder strength.    Rehab Potential Good   Clinical Impairments Affecting Rehab Potential history of multiple bilateral RTC surgeries; history of R elbow surgery   PT Treatment/Interventions Patient/family education;ADLs/Self Care Home Management;Passive range of motion;Manual techniques;Therapeutic exercise;Therapeutic activities;Cryotherapy;Vasopneumatic Device;Moist Heat;Electrical Stimulation;Scar mobilization   PT Next Visit Plan Continue Isometrics & gradually progress strengthening per tolerance & protocol; continue with scapular retraction in sitting and prone; continue PROM & joint mobs for pain and ROM in supine per protocol; progress strengthening per protocol; heat/ice PRN   Consulted and Agree with Plan of Care Patient      Patient will benefit from skilled therapeutic intervention in order to improve the following deficits and impairments:  Decreased range of motion, Decreased strength, Pain, Impaired UE functional use, Decreased activity tolerance, Decreased scar mobility, Postural dysfunction  Visit Diagnosis: Right shoulder pain, unspecified chronicity  Stiffness of right shoulder, not elsewhere classified  Abnormal posture  Muscle weakness (generalized)     Problem List Patient Active Problem List   Diagnosis Date Noted  . Dyslipidemia (high LDL; low HDL) 08/15/2016  .  Complete rotator cuff tear of left shoulder 07/05/2015  . Tennis elbow 05/30/2014  . Rotator cuff (capsule) sprain 03/10/2014  . Right shoulder pain 02/11/2014  . Memory difficulty 12/18/2012  . Gynecomastia, male 07/30/2012  . OSA (obstructive sleep apnea) 04/23/2011  . EXTERNAL HEMORRHOIDS 02/12/2011  . Prostate cancer ([place]) 11/12/2010  . PLANTAR FASCIITIS 04/09/2010  . Hypothyroidism 12/08/2009  . NEOPLASM OF UNCERTAIN BEHAVIOR OF SKIN 03/10/2009  . OSTEOARTHRITIS 02/14/2009  . HEARING DEFICIT 02/08/2009  . Essential hypertension 02/08/2009  . ALLERGIC RHINITIS 02/08/2009  . GERD 02/08/2009  . PROTEINURIA 02/08/2009  . DIVERTICULITIS, HX OF 02/08/2009    Lauralee Evener, SPT 02/13/2017, 1:33 PM  [facility] [address]  [place], [place], [address] Phone: [phone]   Fax:  [phone]  Name: Zachary [name] MRN: [id number] Date of Birth: 22-Sep-1951

## 2017-02-18 ENCOUNTER — Ambulatory Visit: Payer: 59 | Admitting: Physical Therapy

## 2017-02-18 DIAGNOSIS — M6281 Muscle weakness (generalized): Secondary | ICD-10-CM | POA: Diagnosis not present

## 2017-02-18 DIAGNOSIS — M25511 Pain in right shoulder: Secondary | ICD-10-CM | POA: Diagnosis not present

## 2017-02-18 DIAGNOSIS — M25611 Stiffness of right shoulder, not elsewhere classified: Secondary | ICD-10-CM

## 2017-02-18 DIAGNOSIS — R293 Abnormal posture: Secondary | ICD-10-CM

## 2017-02-18 NOTE — Therapy (Signed)
[facility] [address]  [place], [place], [address] Phone: [phone]   Fax:  [phone]  Physical Therapy Treatment  Patient Details  Name: FARHAN LEHN MRN: [id number] Date of Birth: 1951/03/13 Referring Provider: Dr. Corky Mull  Encounter Date: 02/18/2017      PT End of Session - 02/18/17 0800    Visit Number 14   Number of Visits 24   Date for PT Re-Evaluation 03/28/17   PT Start Time 0800   PT Stop Time 0851   PT Time Calculation (min) 51 min   Activity Tolerance Patient tolerated treatment well   Behavior During Therapy [facility] for tasks assessed/performed      Past Medical History:  Diagnosis Date  . Arthritis   . Articular cartilage disease    left shoulder  . Cancer ([place]) 2015   positive prostate cancer bx  . Diverticulitis   . Dyslipidemia (high LDL; low HDL) 08/15/2016  . GERD (gastroesophageal reflux disease)   . Hearing problem    hearing deficit  . Hemorrhoids   . Hypertension   . Hypothyroidism   . Sleep apnea    uses a cpap  . Wears glasses   . Wears hearing aid    both ears    Past Surgical History:  Procedure Laterality Date  . BACK SURGERY  1978   herniated disksurgery  . CARDIAC CATHETERIZATION  08/01/2010  . COLONOSCOPY    . EXAM UNDER ANESTHESIA WITH MANIPULATION OF SHOULDER Right 09/15/2014   Procedure: RIGHT SHOULDER MANIPULATION UNDER ANESTHESIA;  Surgeon: Ninetta Lights, MD;  Location: [place];  Service: Orthopedics;  Laterality: Right;  . HEMORRHOID SURGERY  08/2006  . SHOULDER ARTHROSCOPY WITH DISTAL CLAVICLE RESECTION Left 07/07/2015   Procedure: SHOULDER ARTHROSCOPY WITH DISTAL CLAVICLE RESECTION;  Surgeon: Kathryne Hitch, MD;  Location: [place];  Service: Orthopedics;  Laterality: Left;  . SHOULDER ARTHROSCOPY WITH ROTATOR CUFF REPAIR Left 12/07/2015   Procedure: LEFT SHOULDER ARTHROSCOPY DEBRIDEMENT, WITH ROTATOR CUFF  REPAIR;  Surgeon: Ninetta Lights, MD;  Location: [place];  Service: Orthopedics;  Laterality: Left;  . SHOULDER ARTHROSCOPY WITH ROTATOR CUFF REPAIR AND SUBACROMIAL DECOMPRESSION Left 07/07/2015   Procedure: LEFT SHOULDER SCOPE DEBRIDEMENT, SUBACROMIAL DECOMPRESSION, DISTAL CLAVICULECTOMY, ROTATOR CUFF REPAIR  ;  Surgeon: Kathryne Hitch, MD;  Location: [place];  Service: Orthopedics;  Laterality: Left;  ANESTHESIA: GENERAL, PRE/POST OP SCALENE  . SHOULDER ARTHROSCOPY WITH SUBACROMIAL DECOMPRESSION, ROTATOR CUFF REPAIR AND BICEP TENDON REPAIR Right 03/10/2014   Procedure: RIGHT SHOULDER ARTHROSCOPY WITH SUBACROMIAL DECOMPRESSION, PARTIAL ACROMIOPLASTY WITH CORACOAROMIAL LABRUM DEBRIDEMENT RELEASE DISTAL CLAVICULECTOMY,  ROTATOR CUFF REPAIR AND EXTENSIVE DEBRIDEMENT;  Surgeon: Ninetta Lights, MD;  Location: [place];  Service: Orthopedics;  Laterality: Right;  . TENDON REPAIR  June 06, 2011   right elbow, Dr. Percell Miller    There were no vitals filed for this visit.      Subjective Assessment - 02/18/17 0803    Subjective Pt reports pain is becoming more intermittent. Notes less sleep disturbance, typically only when rolling on to the R shoulder at this point.   Patient Stated Goals "get back to work"    Currently in Pain? Yes   Pain Score 2    Pain Location Shoulder   Pain Orientation Right   Pain Descriptors / Indicators Sharp   Pain Radiating Towards n/a   Pain Onset More than a month ago  Pain Frequency Intermittent   Aggravating Factors  weather, dressing   Pain Relieving Factors e-stim, sling for rest   Effect of Pain on Daily Activities difficulty with upper body dressing                         [facility] Adult PT Treatment/Exercise - 02/18/17 0800      Shoulder Exercises: Supine   Protraction Right;20 reps;Weights   Protraction Weight (lbs) 2   Flexion AROM;Right;15 reps   Other Supine Exercises Shoulder circles at  90 dg flexion CW/CCW x10 each     Shoulder Exercises: Prone   Retraction Right;15 reps;Weights   Retraction Weight (lbs) 1   Retraction Limitations row with 5" hold   Extension Right;15 reps;Weights   Extension Weight (lbs) 1   Horizontal ABduction 1 Right;15 reps   Horizontal ABduction 1 Limitations "T"     Shoulder Exercises: Sidelying   External Rotation AROM;Right;15 reps   ABduction Right;15 reps   ABduction Limitations 20-90 dg     Shoulder Exercises: Standing   Internal Rotation Right;10 reps;Theraband   Theraband Level (Shoulder Internal Rotation) Level 1 (Yellow)   Internal Rotation Limitations neutral shoulder   Flexion Right;10 reps;Theraband   Theraband Level (Shoulder Flexion) Level 1 (Yellow)   Flexion Limitations to ~60 dg   Extension Right;10 reps;Theraband   Theraband Level (Shoulder Extension) Level 1 (Yellow)   Extension Limitations to neutral   Row Both;15 reps;Theraband   Theraband Level (Shoulder Row) Level 2 (Red)   Row Limitations 5" hold      Shoulder Exercises: Pulleys   Flexion 3 minutes   ABduction 3 minutes   ABduction Limitations scaption     Shoulder Exercises: Therapy Ball   Flexion 15 reps   Flexion Limitations seated roll-out with green (65 cm) Pball with stretch at end ROM   ABduction 15 reps   ABduction Limitations seated roll-out with green (65 cm) Pball into scaption with stretch at end ROM     Shoulder Exercises: ROM/Strengthening   Wall Wash Flexion, scaption & overhead arc x10 each     Modalities   Modalities Vasopneumatic     Vasopneumatic   Number Minutes Vasopneumatic  10 minutes   Vasopnuematic Location  Shoulder   Vasopneumatic Pressure Low   Vasopneumatic Temperature  Coldest temperature                  PT Short Term Goals - 01/23/17 [id number]      PT SHORT TERM GOAL #1   Title R shoulder PROM forward flexion to 140, ER at side to 40, & ABD without rotation to 80 by 01/24/17    Status Achieved     PT SHORT  TERM GOAL #2   Title independent with basic HEP by 01/17/17   Status Achieved           PT Long Term Goals - 01/02/17 0808      PT LONG TERM GOAL #1   Title R shoulder AROM WFL by 03/28/17   Status On-going     PT LONG TERM GOAL #2   Title pt is able to return to all ADLs, chores, and work duties without limitation by shoulder pain, LOM, or weakness by 03/28/17.   Status On-going     PT LONG TERM GOAL #3   Title pt able to sleep without limitation by shoulder pain by 03/28/17   Status On-going     PT LONG TERM GOAL #4  Title R shoulder MMT >/= to 4/5 by 03/28/17   Status On-going               Plan - 02/18/17 0843    Clinical Impression Statement Pt demonstrating good progress with increasing AROM against gravity with decreasing shoulder hiking/elevation. Tolerating progression of scapular stabilization as well as introduction of yellow TB resistance to standing flexion, extension & IR (unable to perform ER with TB resistance). Treatment concluded with vasopneumatic compression to minimize post-exercise soreness due to significant exercise progression today.   Rehab Potential Good   Clinical Impairments Affecting Rehab Potential history of multiple bilateral RTC surgeries; history of R elbow surgery   PT Treatment/Interventions Patient/family education;ADLs/Self Care Home Management;Passive range of motion;Manual techniques;Therapeutic exercise;Therapeutic activities;Cryotherapy;Vasopneumatic Device;Moist Heat;Electrical Stimulation;Scar mobilization   PT Next Visit Plan Gradually progress scapular stabilization & RTC strengthening per tolerance & protocol; PROM & joint mobs as indicated for pain and ROMl; modalities PRN   Consulted and Agree with Plan of Care Patient      Patient will benefit from skilled therapeutic intervention in order to improve the following deficits and impairments:  Decreased range of motion, Decreased strength, Pain, Impaired UE functional use,  Decreased activity tolerance, Decreased scar mobility, Postural dysfunction  Visit Diagnosis: Right shoulder pain, unspecified chronicity  Stiffness of right shoulder, not elsewhere classified  Abnormal posture  Muscle weakness (generalized)     Problem List Patient Active Problem List   Diagnosis Date Noted  . Dyslipidemia (high LDL; low HDL) 08/15/2016  . Complete rotator cuff tear of left shoulder 07/05/2015  . Tennis elbow 05/30/2014  . Rotator cuff (capsule) sprain 03/10/2014  . Right shoulder pain 02/11/2014  . Memory difficulty 12/18/2012  . Gynecomastia, male 07/30/2012  . OSA (obstructive sleep apnea) 04/23/2011  . EXTERNAL HEMORRHOIDS 02/12/2011  . Prostate cancer ([place]) 11/12/2010  . PLANTAR FASCIITIS 04/09/2010  . Hypothyroidism 12/08/2009  . NEOPLASM OF UNCERTAIN BEHAVIOR OF SKIN 03/10/2009  . OSTEOARTHRITIS 02/14/2009  . HEARING DEFICIT 02/08/2009  . Essential hypertension 02/08/2009  . ALLERGIC RHINITIS 02/08/2009  . GERD 02/08/2009  . PROTEINURIA 02/08/2009  . DIVERTICULITIS, HX OF 02/08/2009    Percival Spanish, PT, MPT 02/18/2017, 8:53 AM  [facility] [address]  [place], [place], [address] Phone: [phone]   Fax:  [phone]  Name: TESHON CHASSE MRN: [id number] Date of Birth: February 08, 1951

## 2017-02-20 ENCOUNTER — Ambulatory Visit (INDEPENDENT_AMBULATORY_CARE_PROVIDER_SITE_OTHER): Payer: 59 | Admitting: Family Medicine

## 2017-02-20 ENCOUNTER — Ambulatory Visit: Payer: 59 | Attending: Sports Medicine

## 2017-02-20 ENCOUNTER — Encounter: Payer: Self-pay | Admitting: Family Medicine

## 2017-02-20 VITALS — BP 112/81 | HR 81 | Temp 97.8°F | Ht 68.0 in | Wt 204.6 lb

## 2017-02-20 DIAGNOSIS — M25511 Pain in right shoulder: Secondary | ICD-10-CM | POA: Diagnosis not present

## 2017-02-20 DIAGNOSIS — R293 Abnormal posture: Secondary | ICD-10-CM | POA: Diagnosis not present

## 2017-02-20 DIAGNOSIS — M25611 Stiffness of right shoulder, not elsewhere classified: Secondary | ICD-10-CM | POA: Diagnosis not present

## 2017-02-20 DIAGNOSIS — L249 Irritant contact dermatitis, unspecified cause: Secondary | ICD-10-CM | POA: Diagnosis not present

## 2017-02-20 DIAGNOSIS — M6281 Muscle weakness (generalized): Secondary | ICD-10-CM | POA: Diagnosis not present

## 2017-02-20 MED ORDER — PREDNISONE 20 MG PO TABS: 40.0000 mg | ORAL_TABLET | Freq: Every day | ORAL | 0 refills | Status: AC

## 2017-02-20 MED ORDER — LIDOCAINE VISCOUS 2 % MT SOLN
20.0000 mL | OROMUCOSAL | 0 refills | Status: DC | PRN
Start: 2017-02-20 — End: 2017-02-24

## 2017-02-20 MED ORDER — RANITIDINE HCL 150 MG PO TABS: 150.0000 mg | ORAL_TABLET | Freq: Two times a day (BID) | ORAL | 0 refills | Status: DC

## 2017-02-20 MED ORDER — FEXOFENADINE HCL 180 MG PO TABS: 180.0000 mg | ORAL_TABLET | Freq: Every day | ORAL | 0 refills | Status: DC

## 2017-02-20 MED FILL — predniSONE 20 MG TABS: 20 | 5 days supply | Qty: 10 | Fill #0

## 2017-02-20 MED FILL — LIDOCAINE 2% VISCOUS SOLN: 2 | 4 days supply | Qty: 100 | Fill #0

## 2017-02-20 MED FILL — raNITIdine HCL 150 MG TABS: 150 | 7 days supply | Qty: 14 | Fill #0

## 2017-02-20 NOTE — Therapy (Signed)
[facility] [address]  [place], [place], [address] Phone: [phone]   Fax:  [phone]  Physical Therapy Treatment  Patient Details  Name: Zachary [name] MRN: [id number] Date of Birth: 06-24-1951 Referring Provider: Dr. Corky Mull  Encounter Date: 02/20/2017      PT End of Session - 02/20/17 0805    Visit Number 15   Number of Visits 24   Date for PT Re-Evaluation 03/28/17   PT Start Time 0759   PT Stop Time 0844   PT Time Calculation (min) 45 min   Activity Tolerance Patient tolerated treatment well   Behavior During Therapy [facility] for tasks assessed/performed      Past Medical History:  Diagnosis Date  . Arthritis   . Articular cartilage disease    left shoulder  . Cancer ([place]) 2015   positive prostate cancer bx  . Diverticulitis   . Dyslipidemia (high LDL; low HDL) 08/15/2016  . GERD (gastroesophageal reflux disease)   . Hearing problem    hearing deficit  . Hemorrhoids   . Hypertension   . Hypothyroidism   . Sleep apnea    uses a cpap  . Wears glasses   . Wears hearing aid    both ears    Past Surgical History:  Procedure Laterality Date  . BACK SURGERY  1978   herniated disksurgery  . CARDIAC CATHETERIZATION  08/01/2010  . COLONOSCOPY    . EXAM UNDER ANESTHESIA WITH MANIPULATION OF SHOULDER Right 09/15/2014   Procedure: RIGHT SHOULDER MANIPULATION UNDER ANESTHESIA;  Surgeon: Ninetta Lights, MD;  Location: [place];  Service: Orthopedics;  Laterality: Right;  . HEMORRHOID SURGERY  08/2006  . SHOULDER ARTHROSCOPY WITH DISTAL CLAVICLE RESECTION Left 07/07/2015   Procedure: SHOULDER ARTHROSCOPY WITH DISTAL CLAVICLE RESECTION;  Surgeon: Kathryne Hitch, MD;  Location: [place];  Service: Orthopedics;  Laterality: Left;  . SHOULDER ARTHROSCOPY WITH ROTATOR CUFF REPAIR Left 12/07/2015   Procedure: LEFT SHOULDER ARTHROSCOPY DEBRIDEMENT, WITH ROTATOR CUFF  REPAIR;  Surgeon: Ninetta Lights, MD;  Location: [place];  Service: Orthopedics;  Laterality: Left;  . SHOULDER ARTHROSCOPY WITH ROTATOR CUFF REPAIR AND SUBACROMIAL DECOMPRESSION Left 07/07/2015   Procedure: LEFT SHOULDER SCOPE DEBRIDEMENT, SUBACROMIAL DECOMPRESSION, DISTAL CLAVICULECTOMY, ROTATOR CUFF REPAIR  ;  Surgeon: Kathryne Hitch, MD;  Location: [place];  Service: Orthopedics;  Laterality: Left;  ANESTHESIA: GENERAL, PRE/POST OP SCALENE  . SHOULDER ARTHROSCOPY WITH SUBACROMIAL DECOMPRESSION, ROTATOR CUFF REPAIR AND BICEP TENDON REPAIR Right 03/10/2014   Procedure: RIGHT SHOULDER ARTHROSCOPY WITH SUBACROMIAL DECOMPRESSION, PARTIAL ACROMIOPLASTY WITH CORACOAROMIAL LABRUM DEBRIDEMENT RELEASE DISTAL CLAVICULECTOMY,  ROTATOR CUFF REPAIR AND EXTENSIVE DEBRIDEMENT;  Surgeon: Ninetta Lights, MD;  Location: [place];  Service: Orthopedics;  Laterality: Right;  . TENDON REPAIR  June 06, 2011   right elbow, Dr. Percell Miller    There were no vitals filed for this visit.      Subjective Assessment - 02/20/17 0803    Subjective Pt. reporting only mild pain following last treatment.     Patient Stated Goals "get back to work"    Currently in Pain? Yes   Pain Score 2    Pain Location Shoulder   Pain Orientation Right   Pain Descriptors / Indicators Sharp   Pain Type Surgical pain   Pain Relieving Factors Ice    Multiple Pain Sites No  [facility] PT Assessment - 02/20/17 0829      PROM   Right Shoulder Flexion 155 Degrees  tightness    Right Shoulder External Rotation 61 Degrees  some pain increase    Right Shoulder Horizontal ABduction 95 Degrees  ABD; tightness               [facility] Adult PT Treatment/Exercise - 02/20/17 0809      Shoulder Exercises: Supine   Protraction Right;Weights;15 reps   Protraction Weight (lbs) 3   Flexion AROM;Right;20 reps   Other Supine Exercises Shoulder circles at 90 dg flexion 1# CW/CCW (12"  diameter) x 15 each eay      Shoulder Exercises: Prone   Retraction Right;Weights;10 reps   Retraction Weight (lbs) 2   Retraction Limitations row with 5" hold   Extension Right;Weights;20 reps  5" hold    Extension Weight (lbs) 1   Horizontal ABduction 1 Right;15 reps  3" hold    Horizontal ABduction 1 Limitations "T"     Shoulder Exercises: Sidelying   External Rotation AROM;Right;20 reps   ABduction Right;20 reps   ABduction Limitations 20-90 dg   Other Sidelying Exercises L sidelying R shoulder circles CW, CCW in 90 dg abduction position (12" diameter) x 20 reps each way     Shoulder Exercises: Standing   Internal Rotation Right;Theraband;15 reps   Theraband Level (Shoulder Internal Rotation) Level 1 (Yellow)   Internal Rotation Limitations neutral shoulder   Flexion Right;Theraband;15 reps   Theraband Level (Shoulder Flexion) Level 1 (Yellow)   Flexion Limitations to ~60 dg   Extension Right;Theraband;15 reps   Theraband Level (Shoulder Extension) Level 1 (Yellow)   Extension Limitations to neutral     Shoulder Exercises: Pulleys   Flexion 3 minutes   ABduction 3 minutes   ABduction Limitations scaption     Shoulder Exercises: ROM/Strengthening   Wall Wash Flexion, scaption & overhead arc x 15 each     Manual Therapy   Manual Therapy Passive ROM;Joint mobilization   Joint Mobilization Inferior glide with arm at side in neutral  & at 45 degree abduction to improve ROM    Passive ROM gentle R shoulder PROM flexion, scaption, ER, IR with holds at end range            PT Short Term Goals - 01/23/17 0951      PT SHORT TERM GOAL #1   Title R shoulder PROM forward flexion to 140, ER at side to 40, & ABD without rotation to 80 by 01/24/17    Status Achieved     PT SHORT TERM GOAL #2   Title independent with basic HEP by 01/17/17   Status Achieved           PT Long Term Goals - 01/02/17 0808      PT LONG TERM GOAL #1   Title R shoulder AROM WFL by 03/28/17    Status On-going     PT LONG TERM GOAL #2   Title pt is able to return to all ADLs, chores, and work duties without limitation by shoulder pain, LOM, or weakness by 03/28/17.   Status On-going     PT LONG TERM GOAL #3   Title pt able to sleep without limitation by shoulder pain by 03/28/17   Status On-going     PT LONG TERM GOAL #4   Title R shoulder MMT >/= to 4/5 by 03/28/17   Status On-going  Plan - 02/20/17 [id number]    Clinical Impression Statement Pt. reporting pain well controlled following last treatment.  Today's treatment with further progression into gravity resisted AROM activities.  Pt. tolerating all activities well with pain remaining low in treatment.  Scapulohumeral rhythm continues to improve with pt. able to perform wall wash without excessive scap. elevation.  PROM improved following manual stretching today at flexion 155 dg, abd 95 dg, and ER 61 dg.  Pt. declining ice to end treatment today reporting low pain levels.   Pt. will continue to benefit from further skilled therapy to improve UE strength, ROM, and function.     PT Treatment/Interventions Patient/family education;ADLs/Self Care Home Management;Passive range of motion;Manual techniques;Therapeutic exercise;Therapeutic activities;Cryotherapy;Vasopneumatic Device;Moist Heat;Electrical Stimulation;Scar mobilization   PT Next Visit Plan Gradually progress scapular stabilization & RTC strengthening per tolerance & protocol; PROM & joint mobs as indicated for pain and ROMl; modalities PRN      Patient will benefit from skilled therapeutic intervention in order to improve the following deficits and impairments:  Decreased range of motion, Decreased strength, Pain, Impaired UE functional use, Decreased activity tolerance, Decreased scar mobility, Postural dysfunction  Visit Diagnosis: Right shoulder pain, unspecified chronicity  Stiffness of right shoulder, not elsewhere classified  Abnormal  posture  Muscle weakness (generalized)     Problem List Patient Active Problem List   Diagnosis Date Noted  . Dyslipidemia (high LDL; low HDL) 08/15/2016  . Complete rotator cuff tear of left shoulder 07/05/2015  . Tennis elbow 05/30/2014  . Rotator cuff (capsule) sprain 03/10/2014  . Right shoulder pain 02/11/2014  . Memory difficulty 12/18/2012  . Gynecomastia, male 07/30/2012  . OSA (obstructive sleep apnea) 04/23/2011  . EXTERNAL HEMORRHOIDS 02/12/2011  . Prostate cancer ([place]) 11/12/2010  . PLANTAR FASCIITIS 04/09/2010  . Hypothyroidism 12/08/2009  . NEOPLASM OF UNCERTAIN BEHAVIOR OF SKIN 03/10/2009  . OSTEOARTHRITIS 02/14/2009  . HEARING DEFICIT 02/08/2009  . Essential hypertension 02/08/2009  . ALLERGIC RHINITIS 02/08/2009  . GERD 02/08/2009  . PROTEINURIA 02/08/2009  . DIVERTICULITIS, HX OF 02/08/2009    Bess Harvest, PTA 02/20/17 12:47 PM   [facility] [address]  [place], [place], [address] Phone: [phone]   Fax:  [phone]  Name: Zachary [name] MRN: [id number] Date of Birth: 10/19/1951

## 2017-02-20 NOTE — Progress Notes (Signed)
Pre visit review using our clinic review tool, if applicable. No additional management support is needed unless otherwise documented below in the visit note. 

## 2017-02-20 NOTE — Patient Instructions (Addendum)
If you start having swelling of the tongue, neck/throat, or shortness of breath, seek immediate care. If you are unsure and it seems mild, let our office know.  If you aren't better by Monday, I want to see you again.

## 2017-02-20 NOTE — Progress Notes (Signed)
Chief Complaint  Patient presents with  . Lip Swollen    Started Tuesday. Had chapped lips. Used Burt's Bees, Another meidcated chapstick and Abreva. Has noticed burning and swollen lips.     Subjective: Patient is a 66 y.o. male here for Lip swelling.  Around 1 week ago, the patient started having chapped lips. He started using Burts bees and then used Carmex. When this didn't help, he started using Abreva. Around the same time, he started noticing swelling in his lips, more so on the right upper. He is having some burning in his lips in addition to the inside of his mouth near the opening. He is not having any swelling of the tongue, throat, neck, or any shortness of breath. He denies any itching.  ROS: HEENT: As noted in HPI Lungs: Denies SOB   Family History  Problem Relation Age of Onset  . Heart disease Father   . Breast cancer Mother   . Cancer Brother   . Alcohol abuse Other   . Arthritis Other   . Cancer Other     Breast, Prostate  . Coronary artery disease Other   . Irritable bowel syndrome Other   . Cystic fibrosis Other   . Colon cancer Neg Hx    Past Medical History:  Diagnosis Date  . Arthritis   . Articular cartilage disease    left shoulder  . Cancer ([place]) 2015   positive prostate cancer bx  . Diverticulitis   . Dyslipidemia (high LDL; low HDL) 08/15/2016  . GERD (gastroesophageal reflux disease)   . Hearing problem    hearing deficit  . Hemorrhoids   . Hypertension   . Hypothyroidism   . Sleep apnea    uses a cpap  . Wears glasses   . Wears hearing aid    both ears   Allergies  Allergen Reactions  . Oxycodone Itching  . Simvastatin Other (See Comments)    REACTION: muscle pain    Current Outpatient Prescriptions:  .  acetaminophen (TYLENOL) 500 MG tablet, Take 500 mg by mouth every 6 (six) hours as needed., Disp: , Rfl:  .  aspirin EC 81 MG EC tablet, Take 81 mg by mouth daily.  , Disp: , Rfl:  .  Cyanocobalamin (VITAMIN B 12 PO), Take 500  mg by mouth daily., Disp: , Rfl:  .  fexofenadine (ALLEGRA) 180 MG tablet, Take 1 tablet (180 mg total) by mouth daily., Disp: 20 tablet, Rfl: 0 .  fluticasone (FLONASE) 50 MCG/ACT nasal spray, Place 2 sprays into both nostrils daily., Disp: 16 g, Rfl: 6 .  hydrocortisone (ANUSOL-HC) 2.5 % rectal cream, Place 1 application rectally at bedtime as needed for hemorrhoids or itching., Disp: 30 g, Rfl: 6 .  hydrocortisone 2.5 % cream, , Disp: , Rfl: 6 .  levothyroxine (SYNTHROID, LEVOTHROID) 100 MCG tablet, TAKE 1 TABLET (100 MCG TOTAL) BY MOUTH DAILY., Disp: 90 tablet, Rfl: 1 .  lidocaine (XYLOCAINE) 2 % solution, Use as directed 20 mLs in the mouth or throat as needed for mouth pain., Disp: 100 mL, Rfl: 0 .  lisinopril (PRINIVIL,ZESTRIL) 5 MG tablet, Take 1 tablet (5 mg total) by mouth daily., Disp: 90 tablet, Rfl: 1 .  loratadine (CLARITIN) 10 MG tablet, Take 10 mg by mouth daily.  , Disp: , Rfl:  .  Magnesium 250 MG TABS, Take 1 tablet by mouth daily., Disp: , Rfl:  .  Melatonin 5 MG CAPS, Take 2 capsules by mouth daily., Disp: , Rfl:  .  Multiple Vitamins-Minerals (CENTRUM SILVER PO), Take 1 tablet by mouth daily., Disp: , Rfl:  .  naproxen (NAPROSYN) 500 MG tablet, Take 1 tablet (500 mg total) by mouth 2 (two) times daily with a meal. (Patient not taking: Reported on 12/30/2016), Disp: 20 tablet, Rfl: 0 .  omeprazole (PRILOSEC) 40 MG capsule, Take 1 capsule (40 mg total) by mouth daily., Disp: 30 capsule, Rfl: 5 .  pravastatin (PRAVACHOL) 40 MG tablet, Take 0.5 tablets (20 mg total) by mouth daily. NEED FOLLOW UP VISIT IN September., Disp: 45 tablet, Rfl: 2 .  predniSONE (DELTASONE) 20 MG tablet, Take 2 tablets (40 mg total) by mouth daily with breakfast., Disp: 10 tablet, Rfl: 0 .  ranitidine (ZANTAC) 150 MG tablet, Take 1 tablet (150 mg total) by mouth 2 (two) times daily., Disp: 14 tablet, Rfl: 0 .  sodium bicarbonate 650 MG tablet, Take 1 tablet (650 mg total) by mouth daily., Disp: 90 tablet,  Rfl: 1  Current Facility-Administered Medications:  .  0.9 %  sodium chloride infusion, 500 mL, Intravenous, Continuous, Irene Shipper, MD  Objective: BP 112/81 (BP Location: Left Arm, Patient Position: Sitting, Cuff Size: Normal)   Pulse 81   Temp 97.8 F (36.6 C) (Oral)   Ht 5\' 8"  (1.727 m)   Wt 204 lb 9.6 oz (92.8 kg)   BMI 31.11 kg/m  General: Awake, appears stated age HEENT: MMM, EOMi, upper lip is edematous, moreso on the R, bottom lip is symmetric, perhaps mild swelling, some scaling is present on the bottom lip Neck: Symmetric, no swelling, no masses  Heart: RRR, no murmurs Lungs: CTAB, no rales, wheezes or rhonchi. No accessory muscle use Psych: Age appropriate judgment and insight, normal affect and mood  Assessment and Plan: Irritant contact dermatitis, unspecified trigger - Plan: lidocaine (XYLOCAINE) 2 % solution, fexofenadine (ALLEGRA) 180 MG tablet, ranitidine (ZANTAC) 150 MG tablet, predniSONE (DELTASONE) 20 MG tablet  Orders as above. Given the history, sounds like some cervical irritant dermatitis. If he does not improve on the above, I would like him to come back by Monday. We will stop lisinopril and start something else. We will also discuss referral to allergy/immunology. He was instructed to seek immediate care if he starts having swelling of the tongue/neck/throat or any shortness of breath. The patient voiced understanding and agreement to the plan.  [place], DO 02/20/17  8:09 AM

## 2017-02-24 ENCOUNTER — Encounter: Payer: Self-pay | Admitting: Family Medicine

## 2017-02-24 ENCOUNTER — Ambulatory Visit (INDEPENDENT_AMBULATORY_CARE_PROVIDER_SITE_OTHER): Payer: 59 | Admitting: Family Medicine

## 2017-02-24 VITALS — BP 102/60 | HR 77 | Temp 97.7°F | Ht 68.0 in | Wt 207.6 lb

## 2017-02-24 DIAGNOSIS — L249 Irritant contact dermatitis, unspecified cause: Secondary | ICD-10-CM

## 2017-02-24 MED ORDER — LIDOCAINE VISCOUS 2 % MT SOLN: 10.0000 mL | Freq: Four times a day (QID) | OROMUCOSAL | 0 refills | Status: DC | PRN

## 2017-02-24 MED FILL — LIDOCAINE 2% VISCOUS SOLN: 2 | 3 days supply | Qty: 100 | Fill #0

## 2017-02-24 NOTE — Progress Notes (Signed)
Pre visit review using our clinic review tool, if applicable. No additional management support is needed unless otherwise documented below in the visit note. 

## 2017-02-24 NOTE — Progress Notes (Signed)
Chief Complaint  Patient presents with  . Follow-up    on fever blisters-pt states his mouth is slightly better-still sore.    Subjective: Patient is a 66 y.o. male here for burning in his mouth.  Pt was seen last week for lip swelling and placed on prednisone, an H2 and H1 blocker. His swelling has improved, but he continues to have burning over his lips and in his mouth. He does not have any lesions or drainage. He denies fevers, illness, or injury/exposure to caustic material. He has been using Vaseline on his lips which has been helpful. The viscous lidocaine has also soothed things. This issue bothers him most when he brushes his teeth despite trying to change toothpastes.    ROS: HEENT: As noted in HPI  Family History  Problem Relation Age of Onset  . Heart disease Father   . Breast cancer Mother   . Cancer Brother   . Alcohol abuse Other   . Arthritis Other   . Cancer Other     Breast, Prostate  . Coronary artery disease Other   . Irritable bowel syndrome Other   . Cystic fibrosis Other   . Colon cancer Neg Hx    Past Medical History:  Diagnosis Date  . Arthritis   . Articular cartilage disease    left shoulder  . Cancer ([place]) 2015   positive prostate cancer bx  . Diverticulitis   . Dyslipidemia (high LDL; low HDL) 08/15/2016  . GERD (gastroesophageal reflux disease)   . Hearing problem    hearing deficit  . Hemorrhoids   . Hypertension   . Hypothyroidism   . Sleep apnea    uses a cpap  . Wears glasses   . Wears hearing aid    both ears   Allergies  Allergen Reactions  . Oxycodone Itching  . Simvastatin Other (See Comments)    REACTION: muscle pain    Current Outpatient Prescriptions:  .  acetaminophen (TYLENOL) 500 MG tablet, Take 500 mg by mouth every 6 (six) hours as needed., Disp: , Rfl:  .  aspirin EC 81 MG EC tablet, Take 81 mg by mouth daily.  , Disp: , Rfl:  .  Cyanocobalamin (VITAMIN B 12 PO), Take 500 mg by mouth daily., Disp: , Rfl:  .   fexofenadine (ALLEGRA) 180 MG tablet, Take 1 tablet (180 mg total) by mouth daily., Disp: 20 tablet, Rfl: 0 .  fluticasone (FLONASE) 50 MCG/ACT nasal spray, Place 2 sprays into both nostrils daily., Disp: 16 g, Rfl: 6 .  hydrocortisone (ANUSOL-HC) 2.5 % rectal cream, Place 1 application rectally at bedtime as needed for hemorrhoids or itching., Disp: 30 g, Rfl: 6 .  levothyroxine (SYNTHROID, LEVOTHROID) 100 MCG tablet, TAKE 1 TABLET (100 MCG TOTAL) BY MOUTH DAILY., Disp: 90 tablet, Rfl: 1 .  lidocaine (XYLOCAINE) 2 % solution, Use as directed 10 mLs in the mouth or throat every 6 (six) hours as needed for mouth pain., Disp: 100 mL, Rfl: 0 .  lisinopril (PRINIVIL,ZESTRIL) 5 MG tablet, Take 1 tablet (5 mg total) by mouth daily., Disp: 90 tablet, Rfl: 1 .  loratadine (CLARITIN) 10 MG tablet, Take 10 mg by mouth daily.  , Disp: , Rfl:  .  Magnesium 250 MG TABS, Take 1 tablet by mouth daily., Disp: , Rfl:  .  Melatonin 5 MG CAPS, Take 2 capsules by mouth daily., Disp: , Rfl:  .  Multiple Vitamins-Minerals (CENTRUM SILVER PO), Take 1 tablet by mouth daily., Disp: ,  Rfl:  .  omeprazole (PRILOSEC) 40 MG capsule, Take 1 capsule (40 mg total) by mouth daily., Disp: 30 capsule, Rfl: 5 .  pravastatin (PRAVACHOL) 40 MG tablet, Take 0.5 tablets (20 mg total) by mouth daily. NEED FOLLOW UP VISIT IN September., Disp: 45 tablet, Rfl: 2 .  ranitidine (ZANTAC) 150 MG tablet, Take 1 tablet (150 mg total) by mouth 2 (two) times daily., Disp: 14 tablet, Rfl: 0 .  sodium bicarbonate 650 MG tablet, Take 1 tablet (650 mg total) by mouth daily., Disp: 90 tablet, Rfl: 1   Objective: BP 102/60 (BP Location: Left Arm, Patient Position: Sitting, Cuff Size: Large)   Pulse 77   Temp 97.7 F (36.5 C) (Oral)   Ht 5\' 8"  (1.727 m)   Wt 207 lb 9.6 oz (94.2 kg)   SpO2 97%   BMI 31.57 kg/m  General: Awake, appears stated age HEENT: MMM, no oral lesions, no erythema, no ulcerations, no drainage, no evidence of dental decay, no  gingivitis Lungs: No accessory muscle use Psych: Age appropriate judgment and insight, normal affect and mood  Assessment and Plan: Irritant contact dermatitis, unspecified trigger - Plan: lidocaine (XYLOCAINE) 2 % solution  Refill as above. Ok to use Vaseline on lips, not in mouth. Do not swallow solution. Can try ibuprofen.  Call in 1 week if no improvement, will refer to ENT. The patient voiced understanding and agreement to the plan.  [place], DO 02/24/17  2:39 PM

## 2017-02-24 NOTE — Patient Instructions (Addendum)
OK to use Vaseline on lips.  Do not swallow mouth wash.   Ibuprofen 400-600 mg every 6 hours as needed for pain.

## 2017-02-25 ENCOUNTER — Ambulatory Visit: Payer: 59 | Admitting: Physical Therapy

## 2017-02-25 DIAGNOSIS — M25511 Pain in right shoulder: Secondary | ICD-10-CM

## 2017-02-25 DIAGNOSIS — M6281 Muscle weakness (generalized): Secondary | ICD-10-CM

## 2017-02-25 DIAGNOSIS — M25611 Stiffness of right shoulder, not elsewhere classified: Secondary | ICD-10-CM | POA: Diagnosis not present

## 2017-02-25 DIAGNOSIS — R293 Abnormal posture: Secondary | ICD-10-CM | POA: Diagnosis not present

## 2017-02-25 NOTE — Therapy (Signed)
[facility] [address]  [place], [place], [address] Phone: [phone]   Fax:  [phone]  Physical Therapy Treatment  Patient Details  Name: Zachary [name] MRN: [id number] Date of Birth: Apr 23, 1951 Referring Provider: Dr. Corky Mull  Encounter Date: 02/25/2017      PT End of Session - 02/25/17 0800    Visit Number 16   Number of Visits 24   Date for PT Re-Evaluation 03/28/17   PT Start Time 0800   PT Stop Time 0842   PT Time Calculation (min) 42 min   Activity Tolerance Patient tolerated treatment well   Behavior During Therapy [facility] for tasks assessed/performed      Past Medical History:  Diagnosis Date  . Arthritis   . Articular cartilage disease    left shoulder  . Cancer ([place]) 2015   positive prostate cancer bx  . Diverticulitis   . Dyslipidemia (high LDL; low HDL) 08/15/2016  . GERD (gastroesophageal reflux disease)   . Hearing problem    hearing deficit  . Hemorrhoids   . Hypertension   . Hypothyroidism   . Sleep apnea    uses a cpap  . Wears glasses   . Wears hearing aid    both ears    Past Surgical History:  Procedure Laterality Date  . BACK SURGERY  1978   herniated disksurgery  . CARDIAC CATHETERIZATION  08/01/2010  . COLONOSCOPY    . EXAM UNDER ANESTHESIA WITH MANIPULATION OF SHOULDER Right 09/15/2014   Procedure: RIGHT SHOULDER MANIPULATION UNDER ANESTHESIA;  Surgeon: Ninetta Lights, MD;  Location: [place];  Service: Orthopedics;  Laterality: Right;  . HEMORRHOID SURGERY  08/2006  . SHOULDER ARTHROSCOPY WITH DISTAL CLAVICLE RESECTION Left 07/07/2015   Procedure: SHOULDER ARTHROSCOPY WITH DISTAL CLAVICLE RESECTION;  Surgeon: Kathryne Hitch, MD;  Location: [place];  Service: Orthopedics;  Laterality: Left;  . SHOULDER ARTHROSCOPY WITH ROTATOR CUFF REPAIR Left 12/07/2015   Procedure: LEFT SHOULDER ARTHROSCOPY DEBRIDEMENT, WITH ROTATOR CUFF  REPAIR;  Surgeon: Ninetta Lights, MD;  Location: [place];  Service: Orthopedics;  Laterality: Left;  . SHOULDER ARTHROSCOPY WITH ROTATOR CUFF REPAIR AND SUBACROMIAL DECOMPRESSION Left 07/07/2015   Procedure: LEFT SHOULDER SCOPE DEBRIDEMENT, SUBACROMIAL DECOMPRESSION, DISTAL CLAVICULECTOMY, ROTATOR CUFF REPAIR  ;  Surgeon: Kathryne Hitch, MD;  Location: [place];  Service: Orthopedics;  Laterality: Left;  ANESTHESIA: GENERAL, PRE/POST OP SCALENE  . SHOULDER ARTHROSCOPY WITH SUBACROMIAL DECOMPRESSION, ROTATOR CUFF REPAIR AND BICEP TENDON REPAIR Right 03/10/2014   Procedure: RIGHT SHOULDER ARTHROSCOPY WITH SUBACROMIAL DECOMPRESSION, PARTIAL ACROMIOPLASTY WITH CORACOAROMIAL LABRUM DEBRIDEMENT RELEASE DISTAL CLAVICULECTOMY,  ROTATOR CUFF REPAIR AND EXTENSIVE DEBRIDEMENT;  Surgeon: Ninetta Lights, MD;  Location: [place];  Service: Orthopedics;  Laterality: Right;  . TENDON REPAIR  June 06, 2011   right elbow, Dr. Percell Miller    There were no vitals filed for this visit.      Subjective Assessment - 02/25/17 0802    Subjective Pt reporting pain has definitely changed for the better, typically only 2-3/10 at worst. Still feels like rainy day like today are more uncomfortable.   Patient Stated Goals "get back to work"    Currently in Pain? Yes   Pain Score 2    Pain Location Shoulder   Pain Orientation Right   Pain Descriptors / Indicators Dull   Pain Type Surgical pain   Pain Onset More than a month ago  Pain Frequency Intermittent                         [facility] Adult PT Treatment/Exercise - 02/25/17 0800      Shoulder Exercises: Supine   Protraction Right;15 reps;Weights   Protraction Weight (lbs) 4   Flexion Right;10 reps;Weights  2 sets   Shoulder Flexion Weight (lbs) 1# - 1st set, 2# - 2nd set   Other Supine Exercises R Shoulder circles at 90 dg flexion CW/CCW (12" diameter) 2# x15 each     Shoulder Exercises: Prone    Retraction Right;15 reps;Weights;Strengthening   Retraction Weight (lbs) 2   Retraction Limitations row with 5" hold   Extension Right;15 reps;Weights;Strengthening   Extension Weight (lbs) 2   Extension Limitations "I" - 5" hold   Horizontal ABduction 1 Right;15 reps;Weights;Strengthening   Horizontal ABduction 1 Weight (lbs) 1   Horizontal ABduction 1 Limitations "T" - 3" hold     Shoulder Exercises: Sidelying   External Rotation Right;10 reps;Weights;Strengthening   External Rotation Weight (lbs) 1   ABduction AROM;Right;15 reps   ABduction Limitations 20-130 dg (painfree range)   Other Sidelying Exercises L sidelying R shoulder circles CW/CCW in 90 dg ABD (12" diameter) 1# x15 each     Shoulder Exercises: Standing   External Rotation Right;15 reps;Theraband   Theraband Level (Shoulder External Rotation) Level 1 (Yellow)   External Rotation Limitations neutral shoulder   Internal Rotation Right;15 reps;Theraband   Theraband Level (Shoulder Internal Rotation) Level 2 (Red)   Internal Rotation Limitations neutral shoulder     Shoulder Exercises: Pulleys   Flexion 3 minutes   ABduction 3 minutes   ABduction Limitations scaption     Shoulder Exercises: Therapy Ball   Flexion 10 reps   Flexion Limitations standing roll-up with orange (55 cm) Pball on wall with stretch at end ROM   ABduction 10 reps   ABduction Limitations standing roll-up with orange (55 cm) Pball on wall with stretch at end ROM     Shoulder Exercises: ROM/Strengthening   UBE (Upper Arm Bike) lvl 1.0 fwd/back x 2' min   Wall Pushups 10 reps                  PT Short Term Goals - 01/23/17 [id number]      PT SHORT TERM GOAL #1   Title R shoulder PROM forward flexion to 140, ER at side to 40, & ABD without rotation to 80 by 01/24/17    Status Achieved     PT SHORT TERM GOAL #2   Title independent with basic HEP by 01/17/17   Status Achieved           PT Long Term Goals - 01/02/17 0808      PT  LONG TERM GOAL #1   Title R shoulder AROM WFL by 03/28/17   Status On-going     PT LONG TERM GOAL #2   Title pt is able to return to all ADLs, chores, and work duties without limitation by shoulder pain, LOM, or weakness by 03/28/17.   Status On-going     PT LONG TERM GOAL #3   Title pt able to sleep without limitation by shoulder pain by 03/28/17   Status On-going     PT LONG TERM GOAL #4   Title R shoulder MMT >/= to 4/5 by 03/28/17   Status On-going               Plan -  02/25/17 0805    Clinical Impression Statement Pt demonstrating improving AROM in both gravity minimized and anti-gravity positions. Pt continues to tolerate progression of scapular/RTC strengthening with improving scapulohumeral rhythm and only occasional cues to avoid substitution, but remains weakest with ER.    Rehab Potential Good   Clinical Impairments Affecting Rehab Potential history of multiple bilateral RTC surgeries; history of R elbow surgery   PT Treatment/Interventions Patient/family education;ADLs/Self Care Home Management;Passive range of motion;Manual techniques;Therapeutic exercise;Therapeutic activities;Cryotherapy;Vasopneumatic Device;Moist Heat;Electrical Stimulation;Scar mobilization   PT Next Visit Plan Gradually progress scapular stabilization & RTC strengthening per tolerance & protocol; PROM & joint mobs as indicated for pain and ROMl; modalities PRN      Patient will benefit from skilled therapeutic intervention in order to improve the following deficits and impairments:  Decreased range of motion, Decreased strength, Pain, Impaired UE functional use, Decreased activity tolerance, Decreased scar mobility, Postural dysfunction  Visit Diagnosis: Right shoulder pain, unspecified chronicity  Stiffness of right shoulder, not elsewhere classified  Abnormal posture  Muscle weakness (generalized)     Problem List Patient Active Problem List   Diagnosis Date Noted  . Dyslipidemia (high  LDL; low HDL) 08/15/2016  . Complete rotator cuff tear of left shoulder 07/05/2015  . Tennis elbow 05/30/2014  . Rotator cuff (capsule) sprain 03/10/2014  . Right shoulder pain 02/11/2014  . Memory difficulty 12/18/2012  . Gynecomastia, male 07/30/2012  . OSA (obstructive sleep apnea) 04/23/2011  . EXTERNAL HEMORRHOIDS 02/12/2011  . Prostate cancer ([place]) 11/12/2010  . PLANTAR FASCIITIS 04/09/2010  . Hypothyroidism 12/08/2009  . NEOPLASM OF UNCERTAIN BEHAVIOR OF SKIN 03/10/2009  . OSTEOARTHRITIS 02/14/2009  . HEARING DEFICIT 02/08/2009  . Essential hypertension 02/08/2009  . ALLERGIC RHINITIS 02/08/2009  . GERD 02/08/2009  . PROTEINURIA 02/08/2009  . DIVERTICULITIS, HX OF 02/08/2009    Rockney Ghee, MPT 02/25/2017, 8:48 AM  [facility] [place], [place], [address] Phone: [phone]   Fax:  [phone]  Name: Zachary [name] MRN: [id number] Date of Birth: 11-04-1951

## 2017-02-26 ENCOUNTER — Ambulatory Visit: Payer: Self-pay | Admitting: Family Medicine

## 2017-02-27 ENCOUNTER — Ambulatory Visit: Payer: 59

## 2017-02-27 ENCOUNTER — Other Ambulatory Visit: Payer: Self-pay | Admitting: Family Medicine

## 2017-02-27 DIAGNOSIS — M25611 Stiffness of right shoulder, not elsewhere classified: Secondary | ICD-10-CM

## 2017-02-27 DIAGNOSIS — M6281 Muscle weakness (generalized): Secondary | ICD-10-CM | POA: Diagnosis not present

## 2017-02-27 DIAGNOSIS — K219 Gastro-esophageal reflux disease without esophagitis: Secondary | ICD-10-CM

## 2017-02-27 DIAGNOSIS — M25511 Pain in right shoulder: Secondary | ICD-10-CM | POA: Diagnosis not present

## 2017-02-27 DIAGNOSIS — R293 Abnormal posture: Secondary | ICD-10-CM

## 2017-02-27 NOTE — Therapy (Signed)
[facility] [address]  [place], [place], [address] Phone: [phone]   Fax:  [phone]  Physical Therapy Treatment  Patient Details  Name: Zachary [name] MRN: [id number] Date of Birth: 08-Sep-1951 Referring Provider: Dr. Corky Mull  Encounter Date: 02/27/2017      PT End of Session - 02/27/17 0804    Visit Number 17   Number of Visits 24   Date for PT Re-Evaluation 03/28/17   PT Start Time 0800   PT Stop Time 0848   PT Time Calculation (min) 48 min   Activity Tolerance Patient tolerated treatment well   Behavior During Therapy [facility] for tasks assessed/performed      Past Medical History:  Diagnosis Date  . Arthritis   . Articular cartilage disease    left shoulder  . Cancer ([place]) 2015   positive prostate cancer bx  . Diverticulitis   . Dyslipidemia (high LDL; low HDL) 08/15/2016  . GERD (gastroesophageal reflux disease)   . Hearing problem    hearing deficit  . Hemorrhoids   . Hypertension   . Hypothyroidism   . Sleep apnea    uses a cpap  . Wears glasses   . Wears hearing aid    both ears    Past Surgical History:  Procedure Laterality Date  . BACK SURGERY  1978   herniated disksurgery  . CARDIAC CATHETERIZATION  08/01/2010  . COLONOSCOPY    . EXAM UNDER ANESTHESIA WITH MANIPULATION OF SHOULDER Right 09/15/2014   Procedure: RIGHT SHOULDER MANIPULATION UNDER ANESTHESIA;  Surgeon: Ninetta Lights, MD;  Location: [place];  Service: Orthopedics;  Laterality: Right;  . HEMORRHOID SURGERY  08/2006  . SHOULDER ARTHROSCOPY WITH DISTAL CLAVICLE RESECTION Left 07/07/2015   Procedure: SHOULDER ARTHROSCOPY WITH DISTAL CLAVICLE RESECTION;  Surgeon: Kathryne Hitch, MD;  Location: [place];  Service: Orthopedics;  Laterality: Left;  . SHOULDER ARTHROSCOPY WITH ROTATOR CUFF REPAIR Left 12/07/2015   Procedure: LEFT SHOULDER ARTHROSCOPY DEBRIDEMENT, WITH ROTATOR CUFF  REPAIR;  Surgeon: Ninetta Lights, MD;  Location: [place];  Service: Orthopedics;  Laterality: Left;  . SHOULDER ARTHROSCOPY WITH ROTATOR CUFF REPAIR AND SUBACROMIAL DECOMPRESSION Left 07/07/2015   Procedure: LEFT SHOULDER SCOPE DEBRIDEMENT, SUBACROMIAL DECOMPRESSION, DISTAL CLAVICULECTOMY, ROTATOR CUFF REPAIR  ;  Surgeon: Kathryne Hitch, MD;  Location: [place];  Service: Orthopedics;  Laterality: Left;  ANESTHESIA: GENERAL, PRE/POST OP SCALENE  . SHOULDER ARTHROSCOPY WITH SUBACROMIAL DECOMPRESSION, ROTATOR CUFF REPAIR AND BICEP TENDON REPAIR Right 03/10/2014   Procedure: RIGHT SHOULDER ARTHROSCOPY WITH SUBACROMIAL DECOMPRESSION, PARTIAL ACROMIOPLASTY WITH CORACOAROMIAL LABRUM DEBRIDEMENT RELEASE DISTAL CLAVICULECTOMY,  ROTATOR CUFF REPAIR AND EXTENSIVE DEBRIDEMENT;  Surgeon: Ninetta Lights, MD;  Location: [place];  Service: Orthopedics;  Laterality: Right;  . TENDON REPAIR  June 06, 2011   right elbow, Dr. Percell Miller    There were no vitals filed for this visit.      Subjective Assessment - 02/27/17 0801    Subjective Pt. reporting less pain today.  Pt. not wearing sling at work today reporting he has it if he needs it.     Patient Stated Goals "get back to work"    Currently in Pain? Yes   Pain Score 2    Pain Location Shoulder   Pain Orientation Right   Pain Descriptors / Indicators Dull   Pain Type Surgical pain   Pain Onset More than a month ago  Pain Frequency Intermittent   Aggravating Factors  weather, dressing    Pain Relieving Factors resting   Multiple Pain Sites No            [facility] PT Assessment - 02/27/17 0823      PROM   PROM Assessment Site Shoulder   Right/Left Shoulder Right   Right Shoulder Flexion 160 Degrees   Right Shoulder ABduction 157 Degrees   Right Shoulder External Rotation 85 Degrees                     [facility] Adult PT Treatment/Exercise - 02/27/17 0810      Shoulder Exercises: Supine    Protraction Right;Weights;20 reps   Protraction Weight (lbs) 4   Flexion Right;Weights;15 reps   Shoulder Flexion Weight (lbs) 2#; 1 sets    Other Supine Exercises R Shoulder circles at 90 dg flexion CW/CCW (18" diameter) 2# x 20 each     Shoulder Exercises: Sidelying   External Rotation Right;Weights;Strengthening;15 reps   External Rotation Weight (lbs) 1   External Rotation Limitations tactile cues provided for scap squeeze    ABduction AROM;Right;15 reps   ABduction Weight (lbs) 1   ABduction Limitations 20-130 dg (painfree range)     Shoulder Exercises: Standing   External Rotation Right;Theraband;20 reps   Theraband Level (Shoulder External Rotation) Level 1 (Yellow)   External Rotation Limitations neutral shoulder   Internal Rotation Right;Theraband;20 reps   Theraband Level (Shoulder Internal Rotation) Level 2 (Red)   Internal Rotation Limitations neutral shoulder     Shoulder Exercises: Pulleys   Flexion 3 minutes   ABduction 3 minutes   ABduction Limitations scaption/abduction     Shoulder Exercises: Therapy Ball   Flexion 15 reps   Flexion Limitations standing roll-up with orange (55 cm) Pball on wall with stretch at end ROM   ABduction 15 reps   ABduction Limitations standing roll-up with orange (55 cm) Pball on wall with stretch at end ROM     Shoulder Exercises: ROM/Strengthening   UBE (Upper Arm Bike) lvl 1.0 fwd/back x 2' min   Wall Pushups 15 reps  moved feet toward wall for last 5 due to report of tightness     Manual Therapy   Manual Therapy Passive ROM;Joint mobilization   Joint Mobilization Inferior glide with arm at side in neutral  & at 45 degree abduction to improve ROM    Passive ROM gentle R shoulder PROM flexion, abduction, ER, IR with holds at end range                PT Education - 02/27/17 0854    Education provided Yes   Education Details shoulder IR, ER with yellow TB issued to pt.    Person(s) Educated Patient   Methods  Explanation;Demonstration;Verbal cues;Handout   Comprehension Verbalized understanding;Returned demonstration;Verbal cues required;Need further instruction          PT Short Term Goals - 01/23/17 0951      PT SHORT TERM GOAL #1   Title R shoulder PROM forward flexion to 140, ER at side to 40, & ABD without rotation to 80 by 01/24/17    Status Achieved     PT SHORT TERM GOAL #2   Title independent with basic HEP by 01/17/17   Status Achieved           PT Long Term Goals - 01/02/17 0808      PT LONG TERM GOAL #1   Title R shoulder AROM  WFL by 03/28/17   Status On-going     PT LONG TERM GOAL #2   Title pt is able to return to all ADLs, chores, and work duties without limitation by shoulder pain, LOM, or weakness by 03/28/17.   Status On-going     PT LONG TERM GOAL #3   Title pt able to sleep without limitation by shoulder pain by 03/28/17   Status On-going     PT LONG TERM GOAL #4   Title R shoulder MMT >/= to 4/5 by 03/28/17   Status On-going               Plan - 02/27/17 0805    Clinical Impression Statement Pt. tolerating progression in all gravity resisted AAROM activities today with only mild pain increase.  Improved PROM today flexion 160 dg, abduction 157 dg, ER 85 dg.  Pt. still with pain while performing high reaching, and pulling motions at home and at work.  Pt. is now able to work without sling.  Pt. progressing well at this point with pain well controlled following therex.  IR, ER with yellow TB issued to pt. today.  Pt. able to perform these activities with good technique and only minor pain increase today.  Pt. will continue to benefit from further skilled therapy to improve ROM, strength, and maximize function.   PT Treatment/Interventions Patient/family education;ADLs/Self Care Home Management;Passive range of motion;Manual techniques;Therapeutic exercise;Therapeutic activities;Cryotherapy;Vasopneumatic Device;Moist Heat;Electrical Stimulation;Scar mobilization    PT Next Visit Plan Address goals for MD f/u on 3.14; Gradually progress scapular stabilization & RTC strengthening per tolerance & protocol; PROM & joint mobs as indicated for pain and ROMl; modalities PRN      Patient will benefit from skilled therapeutic intervention in order to improve the following deficits and impairments:  Decreased range of motion, Decreased strength, Pain, Impaired UE functional use, Decreased activity tolerance, Decreased scar mobility, Postural dysfunction  Visit Diagnosis: Right shoulder pain, unspecified chronicity  Stiffness of right shoulder, not elsewhere classified  Abnormal posture  Muscle weakness (generalized)     Problem List Patient Active Problem List   Diagnosis Date Noted  . Dyslipidemia (high LDL; low HDL) 08/15/2016  . Complete rotator cuff tear of left shoulder 07/05/2015  . Tennis elbow 05/30/2014  . Rotator cuff (capsule) sprain 03/10/2014  . Right shoulder pain 02/11/2014  . Memory difficulty 12/18/2012  . Gynecomastia, male 07/30/2012  . OSA (obstructive sleep apnea) 04/23/2011  . EXTERNAL HEMORRHOIDS 02/12/2011  . Prostate cancer ([place]) 11/12/2010  . PLANTAR FASCIITIS 04/09/2010  . Hypothyroidism 12/08/2009  . NEOPLASM OF UNCERTAIN BEHAVIOR OF SKIN 03/10/2009  . OSTEOARTHRITIS 02/14/2009  . HEARING DEFICIT 02/08/2009  . Essential hypertension 02/08/2009  . ALLERGIC RHINITIS 02/08/2009  . GERD 02/08/2009  . PROTEINURIA 02/08/2009  . DIVERTICULITIS, HX OF 02/08/2009    Bess Harvest, PTA 02/27/17 9:13 AM  [facility] [address]  [place], [place], [address] Phone: [phone]   Fax:  [phone]  Name: PALMER FAHRNER MRN: [id number] Date of Birth: 08-12-1951

## 2017-02-28 MED FILL — OMEPRAZOLE DR 40 MG CAPSULE: 40 | 30 days supply | Qty: 30 | Fill #0

## 2017-03-04 ENCOUNTER — Ambulatory Visit: Payer: 59 | Admitting: Physical Therapy

## 2017-03-04 DIAGNOSIS — M6281 Muscle weakness (generalized): Secondary | ICD-10-CM | POA: Diagnosis not present

## 2017-03-04 DIAGNOSIS — M25511 Pain in right shoulder: Secondary | ICD-10-CM

## 2017-03-04 DIAGNOSIS — M25611 Stiffness of right shoulder, not elsewhere classified: Secondary | ICD-10-CM

## 2017-03-04 DIAGNOSIS — R293 Abnormal posture: Secondary | ICD-10-CM

## 2017-03-04 NOTE — Therapy (Signed)
[facility] [address]  [place], [place], [address] Phone: [phone]   Fax:  [phone]  Physical Therapy Treatment  Patient Details  Name: Zachary [name] MRN: [id number] Date of Birth: 05-12-1951 Referring Provider: Dr. Corky Mull  Encounter Date: 03/04/2017      PT End of Session - 03/04/17 0800    Visit Number 18   Number of Visits 24   Date for PT Re-Evaluation 03/28/17   PT Start Time 0800   PT Stop Time 0847   PT Time Calculation (min) 47 min   Activity Tolerance Patient tolerated treatment well   Behavior During Therapy [facility] for tasks assessed/performed      Past Medical History:  Diagnosis Date  . Arthritis   . Articular cartilage disease    left shoulder  . Cancer ([place]) 2015   positive prostate cancer bx  . Diverticulitis   . Dyslipidemia (high LDL; low HDL) 08/15/2016  . GERD (gastroesophageal reflux disease)   . Hearing problem    hearing deficit  . Hemorrhoids   . Hypertension   . Hypothyroidism   . Sleep apnea    uses a cpap  . Wears glasses   . Wears hearing aid    both ears    Past Surgical History:  Procedure Laterality Date  . BACK SURGERY  1978   herniated disksurgery  . CARDIAC CATHETERIZATION  08/01/2010  . COLONOSCOPY    . EXAM UNDER ANESTHESIA WITH MANIPULATION OF SHOULDER Right 09/15/2014   Procedure: RIGHT SHOULDER MANIPULATION UNDER ANESTHESIA;  Surgeon: Ninetta Lights, MD;  Location: [place];  Service: Orthopedics;  Laterality: Right;  . HEMORRHOID SURGERY  08/2006  . SHOULDER ARTHROSCOPY WITH DISTAL CLAVICLE RESECTION Left 07/07/2015   Procedure: SHOULDER ARTHROSCOPY WITH DISTAL CLAVICLE RESECTION;  Surgeon: Kathryne Hitch, MD;  Location: [place];  Service: Orthopedics;  Laterality: Left;  . SHOULDER ARTHROSCOPY WITH ROTATOR CUFF REPAIR Left 12/07/2015   Procedure: LEFT SHOULDER ARTHROSCOPY DEBRIDEMENT, WITH ROTATOR CUFF  REPAIR;  Surgeon: Ninetta Lights, MD;  Location: [place];  Service: Orthopedics;  Laterality: Left;  . SHOULDER ARTHROSCOPY WITH ROTATOR CUFF REPAIR AND SUBACROMIAL DECOMPRESSION Left 07/07/2015   Procedure: LEFT SHOULDER SCOPE DEBRIDEMENT, SUBACROMIAL DECOMPRESSION, DISTAL CLAVICULECTOMY, ROTATOR CUFF REPAIR  ;  Surgeon: Kathryne Hitch, MD;  Location: [place];  Service: Orthopedics;  Laterality: Left;  ANESTHESIA: GENERAL, PRE/POST OP SCALENE  . SHOULDER ARTHROSCOPY WITH SUBACROMIAL DECOMPRESSION, ROTATOR CUFF REPAIR AND BICEP TENDON REPAIR Right 03/10/2014   Procedure: RIGHT SHOULDER ARTHROSCOPY WITH SUBACROMIAL DECOMPRESSION, PARTIAL ACROMIOPLASTY WITH CORACOAROMIAL LABRUM DEBRIDEMENT RELEASE DISTAL CLAVICULECTOMY,  ROTATOR CUFF REPAIR AND EXTENSIVE DEBRIDEMENT;  Surgeon: Ninetta Lights, MD;  Location: [place];  Service: Orthopedics;  Laterality: Right;  . TENDON REPAIR  June 06, 2011   right elbow, Dr. Percell Miller    There were no vitals filed for this visit.      Subjective Assessment - 03/04/17 0803    Subjective Pt continues to note worse pain on inclement weather days such as yesterday.   Patient Stated Goals "get back to work"    Currently in Pain? Yes   Pain Score --  2-3/10   Pain Location Shoulder   Pain Orientation Right   Pain Descriptors / Indicators Dull   Pain Type Surgical pain   Pain Onset More than a month ago   Pain Frequency Intermittent   Aggravating Factors  weather  Pain Relieving Factors resting, Tylenol, ice   Effect of Pain on Daily Activities limits reaching away from body or overhead            [facility] PT Assessment - 03/04/17 0800      Assessment   Medical Diagnosis R RTC Repair with bone cyst removal & bone graft of humeral head   Referring Provider Dr. Corky Mull   Onset Date/Surgical Date 11/29/16   Hand Dominance Right   Next MD Visit 03/05/17     ROM / Strength   AROM / PROM / Strength  AROM;PROM;Strength     AROM   AROM Assessment Site Shoulder   Right/Left Shoulder Right   Right Shoulder Flexion 141 Degrees   in sitting; 167 in supine   Right Shoulder ABduction 146 Degrees  in sitting; 164 in supine   Right Shoulder Internal Rotation 86 Degrees   Right Shoulder External Rotation 84 Degrees     PROM   PROM Assessment Site Shoulder   Right/Left Shoulder Right   Right Shoulder Flexion 170 Degrees   Right Shoulder ABduction 166 Degrees   Right Shoulder Internal Rotation 90 Degrees   Right Shoulder External Rotation 91 Degrees     Strength   Strength Assessment Site Shoulder   Right/Left Shoulder Right;Left   Right Shoulder Flexion 4-/5   Right Shoulder ABduction 4-/5  pain with resistance   Right Shoulder Internal Rotation 4/5   Right Shoulder External Rotation 4-/5   Left Shoulder Flexion 4/5   Left Shoulder ABduction 4/5   Left Shoulder Internal Rotation 4+/5   Left Shoulder External Rotation 4/5                     [facility] Adult PT Treatment/Exercise - 03/04/17 0800      Shoulder Exercises: Sidelying   External Rotation Right;15 reps;Weights   External Rotation Weight (lbs) 1     Shoulder Exercises: Standing   External Rotation Right;15 reps;Theraband   Theraband Level (Shoulder External Rotation) Level 2 (Red)   External Rotation Limitations limited range; neutral shoulder   Internal Rotation Right;Theraband;20 reps   Theraband Level (Shoulder Internal Rotation) Level 2 (Red)   Internal Rotation Limitations neutral shoulder   Flexion Both;12 reps;Weights   Shoulder Flexion Weight (lbs) 2   Flexion Limitations standing against 6" FR on wall   ABduction Both;12 reps;Weights   Shoulder ABduction Weight (lbs) 1   ABduction Limitations slight scaption; standing against 6" FR on wall   Extension Right;15 reps;Theraband   Theraband Level (Shoulder Extension) Level 2 (Red)   Row Both;15 reps;Theraband   Theraband Level (Shoulder Row)  Level 3 (Green)   Row Limitations 5" hold    Other Standing Exercises Placing weights to cabinet 2nd shelf 1# x15, 2# x15     Shoulder Exercises: Pulleys   Flexion 3 minutes   ABduction 3 minutes   ABduction Limitations scaption/abduction     Shoulder Exercises: Therapy Ball   Flexion 15 reps   Flexion Limitations 2# cuff wt on R; standing roll-up with orange (55 cm) Pball on wall with stretch at end ROM   ABduction 15 reps   ABduction Limitations 2# cuff wt on R; standing roll-up with orange (55 cm) Pball on wall with stretch at end ROM     Shoulder Exercises: ROM/Strengthening   UBE (Upper Arm Bike) lvl 2.0 fwd/back 3' each                  PT  Short Term Goals - 01/23/17 0951      PT SHORT TERM GOAL #1   Title R shoulder PROM forward flexion to 140, ER at side to 40, & ABD without rotation to 80 by 01/24/17    Status Achieved     PT SHORT TERM GOAL #2   Title independent with basic HEP by 01/17/17   Status Achieved           PT Long Term Goals - 03/04/17 0811      PT LONG TERM GOAL #1   Title R shoulder AROM WFL by 03/28/17   Status Partially Met  met for gravity minimized AROM; nearly met for AROM against gravity     PT LONG TERM GOAL #2   Title pt is able to return to all ADLs, chores, and work duties without limitation by shoulder pain, LOM, or weakness by 03/28/17.   Status On-going     PT LONG TERM GOAL #3   Title pt able to sleep without limitation by shoulder pain by 03/28/17   Status Partially Met     PT LONG TERM GOAL #4   Title R shoulder MMT >/= to 4/5 by 03/28/17   Status On-going               Plan - 03/04/17 0811    Clinical Impression Statement Pt continues to demonstrate good progress with PT s/p R RCR. R shoulder AROM nearly WNL in gravity minimized position, with continued weakness noted with pt upright. Strength improving with all R shoulder motions, with MMT at least 4-/5 for available range. Pt continues to note difficulty with  reaching away from body or overhead with R shoulder abduction and ER most limited. Pt continues to demonstrate good potential to benefit from skilled PT to maximze R shoulder functional AROM and strength for return to normal use of R UE with daily tasks.   Rehab Potential Good   Clinical Impairments Affecting Rehab Potential history of multiple bilateral RTC surgeries; history of R elbow surgery   PT Treatment/Interventions Patient/family education;ADLs/Self Care Home Management;Passive range of motion;Manual techniques;Therapeutic exercise;Therapeutic activities;Cryotherapy;Vasopneumatic Device;Moist Heat;Electrical Stimulation;Scar mobilization   PT Next Visit Plan Gradually progress scapular stabilization & RTC strengthening per tolerance & protocol; PROM & joint mobs as indicated for pain and ROMl; modalities PRN   Consulted and Agree with Plan of Care Patient      Patient will benefit from skilled therapeutic intervention in order to improve the following deficits and impairments:  Decreased range of motion, Decreased strength, Pain, Impaired UE functional use, Decreased activity tolerance, Decreased scar mobility, Postural dysfunction  Visit Diagnosis: Right shoulder pain, unspecified chronicity  Stiffness of right shoulder, not elsewhere classified  Abnormal posture  Muscle weakness (generalized)     Problem List Patient Active Problem List   Diagnosis Date Noted  . Dyslipidemia (high LDL; low HDL) 08/15/2016  . Complete rotator cuff tear of left shoulder 07/05/2015  . Tennis elbow 05/30/2014  . Rotator cuff (capsule) sprain 03/10/2014  . Right shoulder pain 02/11/2014  . Memory difficulty 12/18/2012  . Gynecomastia, male 07/30/2012  . OSA (obstructive sleep apnea) 04/23/2011  . EXTERNAL HEMORRHOIDS 02/12/2011  . Prostate cancer ([place]) 11/12/2010  . PLANTAR FASCIITIS 04/09/2010  . Hypothyroidism 12/08/2009  . NEOPLASM OF UNCERTAIN BEHAVIOR OF SKIN 03/10/2009  .  OSTEOARTHRITIS 02/14/2009  . HEARING DEFICIT 02/08/2009  . Essential hypertension 02/08/2009  . ALLERGIC RHINITIS 02/08/2009  . GERD 02/08/2009  . PROTEINURIA 02/08/2009  . DIVERTICULITIS, HX  OF 02/08/2009    Percival Spanish, PT, MPT 03/04/2017, 8:59 AM  [facility] [address]  [place], [place], [address] Phone: [phone]   Fax:  [phone]  Name: Zachary [name] MRN: [id number] Date of Birth: [birth year]

## 2017-03-05 DIAGNOSIS — Z4789 Encounter for other orthopedic aftercare: Secondary | ICD-10-CM | POA: Diagnosis not present

## 2017-03-05 DIAGNOSIS — Z7982 Long term (current) use of aspirin: Secondary | ICD-10-CM | POA: Diagnosis not present

## 2017-03-05 DIAGNOSIS — Z9889 Other specified postprocedural states: Secondary | ICD-10-CM | POA: Diagnosis not present

## 2017-03-05 DIAGNOSIS — I10 Essential (primary) hypertension: Secondary | ICD-10-CM | POA: Diagnosis not present

## 2017-03-05 DIAGNOSIS — M25511 Pain in right shoulder: Secondary | ICD-10-CM | POA: Diagnosis not present

## 2017-03-05 DIAGNOSIS — E785 Hyperlipidemia, unspecified: Secondary | ICD-10-CM | POA: Diagnosis not present

## 2017-03-05 DIAGNOSIS — E039 Hypothyroidism, unspecified: Secondary | ICD-10-CM | POA: Diagnosis not present

## 2017-03-05 DIAGNOSIS — Z79899 Other long term (current) drug therapy: Secondary | ICD-10-CM | POA: Diagnosis not present

## 2017-03-06 ENCOUNTER — Ambulatory Visit: Payer: 59 | Admitting: Physical Therapy

## 2017-03-06 DIAGNOSIS — M25511 Pain in right shoulder: Secondary | ICD-10-CM

## 2017-03-06 DIAGNOSIS — M25611 Stiffness of right shoulder, not elsewhere classified: Secondary | ICD-10-CM | POA: Diagnosis not present

## 2017-03-06 DIAGNOSIS — R293 Abnormal posture: Secondary | ICD-10-CM

## 2017-03-06 DIAGNOSIS — M6281 Muscle weakness (generalized): Secondary | ICD-10-CM

## 2017-03-06 NOTE — Therapy (Signed)
[facility] [address]  [place], [place], [address] Phone: [phone]   Fax:  [phone]  Physical Therapy Treatment  Patient Details  Name: Zachary [name] MRN: [id number] Date of Birth: 04/23/1951 Referring Provider: Dr. Corky Mull  Encounter Date: 03/06/2017      PT End of Session - 03/06/17 0800    Visit Number 19   Number of Visits 38   Date for PT Re-Evaluation 05/09/17   PT Start Time 0800   PT Stop Time 0845   PT Time Calculation (min) 45 min   Activity Tolerance Patient tolerated treatment well   Behavior During Therapy [facility] for tasks assessed/performed      Past Medical History:  Diagnosis Date  . Arthritis   . Articular cartilage disease    left shoulder  . Cancer ([place]) 2015   positive prostate cancer bx  . Diverticulitis   . Dyslipidemia (high LDL; low HDL) 08/15/2016  . GERD (gastroesophageal reflux disease)   . Hearing problem    hearing deficit  . Hemorrhoids   . Hypertension   . Hypothyroidism   . Sleep apnea    uses a cpap  . Wears glasses   . Wears hearing aid    both ears    Past Surgical History:  Procedure Laterality Date  . BACK SURGERY  1978   herniated disksurgery  . CARDIAC CATHETERIZATION  08/01/2010  . COLONOSCOPY    . EXAM UNDER ANESTHESIA WITH MANIPULATION OF SHOULDER Right 09/15/2014   Procedure: RIGHT SHOULDER MANIPULATION UNDER ANESTHESIA;  Surgeon: Ninetta Lights, MD;  Location: [place];  Service: Orthopedics;  Laterality: Right;  . HEMORRHOID SURGERY  08/2006  . SHOULDER ARTHROSCOPY WITH DISTAL CLAVICLE RESECTION Left 07/07/2015   Procedure: SHOULDER ARTHROSCOPY WITH DISTAL CLAVICLE RESECTION;  Surgeon: Kathryne Hitch, MD;  Location: [place];  Service: Orthopedics;  Laterality: Left;  . SHOULDER ARTHROSCOPY WITH ROTATOR CUFF REPAIR Left 12/07/2015   Procedure: LEFT SHOULDER ARTHROSCOPY DEBRIDEMENT, WITH ROTATOR CUFF  REPAIR;  Surgeon: Ninetta Lights, MD;  Location: [place];  Service: Orthopedics;  Laterality: Left;  . SHOULDER ARTHROSCOPY WITH ROTATOR CUFF REPAIR AND SUBACROMIAL DECOMPRESSION Left 07/07/2015   Procedure: LEFT SHOULDER SCOPE DEBRIDEMENT, SUBACROMIAL DECOMPRESSION, DISTAL CLAVICULECTOMY, ROTATOR CUFF REPAIR  ;  Surgeon: Kathryne Hitch, MD;  Location: [place];  Service: Orthopedics;  Laterality: Left;  ANESTHESIA: GENERAL, PRE/POST OP SCALENE  . SHOULDER ARTHROSCOPY WITH SUBACROMIAL DECOMPRESSION, ROTATOR CUFF REPAIR AND BICEP TENDON REPAIR Right 03/10/2014   Procedure: RIGHT SHOULDER ARTHROSCOPY WITH SUBACROMIAL DECOMPRESSION, PARTIAL ACROMIOPLASTY WITH CORACOAROMIAL LABRUM DEBRIDEMENT RELEASE DISTAL CLAVICULECTOMY,  ROTATOR CUFF REPAIR AND EXTENSIVE DEBRIDEMENT;  Surgeon: Ninetta Lights, MD;  Location: [place];  Service: Orthopedics;  Laterality: Right;  . TENDON REPAIR  June 06, 2011   right elbow, Dr. Percell Miller    There were no vitals filed for this visit.      Subjective Assessment - 03/06/17 0802    Subjective Pt saw MD yesterday and was told "everything looks good". Pt noting increased pain in top of shouler today and feels this stems from palpation by the PA & MD yesterday. States MD wants him to continue PT for another 8 weeks.   Patient Stated Goals "get back to work"    Currently in Pain? Yes   Pain Score 4    Pain Location Shoulder   Pain Orientation Right   Pain Descriptors /  Indicators Sharp   Pain Type Surgical pain   Pain Onset More than a month ago            [facility] PT Assessment - 03/06/17 0800      Assessment   Medical Diagnosis R RTC Repair with bone cyst removal & bone graft of humeral head   Referring Provider Dr. Corky Mull   Onset Date/Surgical Date 11/29/16   Hand Dominance Right   Next MD Visit 05/08/17     Prior Function   Level of Independence Independent   Vocation Full time employment   Vocation  Requirements Full Duty: warehouse. lifting, pulling, pushing, computer work   Leisure yard work, Architect working     AROM   Right Shoulder Flexion 141 Degrees   in sitting; 167 in supine   Right Shoulder ABduction 146 Degrees  in sitting; 164 in supine   Right Shoulder Internal Rotation 86 Degrees   Right Shoulder External Rotation 84 Degrees     PROM   Right Shoulder Flexion 170 Degrees   Right Shoulder ABduction 166 Degrees   Right Shoulder Internal Rotation 90 Degrees   Right Shoulder External Rotation 91 Degrees     Strength   Right Shoulder Flexion 4-/5   Right Shoulder ABduction 4-/5  pain with resistance   Right Shoulder Internal Rotation 4/5   Right Shoulder External Rotation 4-/5   Left Shoulder Flexion 4/5   Left Shoulder ABduction 4/5   Left Shoulder Internal Rotation 4+/5   Left Shoulder External Rotation 4/5                     [facility] Adult PT Treatment/Exercise - 03/06/17 0800      Shoulder Exercises: Standing   Flexion Both;12 reps;Weights;Right;15 reps;Theraband   Theraband Level (Shoulder Flexion) Level 2 (Red)   Shoulder Flexion Weight (lbs) 2   Flexion Limitations standing against 6" FR on wall   Extension Right;15 reps;Theraband   Theraband Level (Shoulder Extension) Level 2 (Red)     Shoulder Exercises: Pulleys   Flexion 3 minutes   ABduction 3 minutes   ABduction Limitations scaption/abduction     Shoulder Exercises: Therapy Ball   Flexion 15 reps   Flexion Limitations 2# cuff wt on R; standing roll-up with orange (55 cm) Pball on wall with stretch at end ROM   ABduction 15 reps   ABduction Limitations 2# cuff wt on R; standing roll-up with orange (55 cm) Pball on wall with stretch at end ROM     Shoulder Exercises: ROM/Strengthening   UBE (Upper Arm Bike) lvl 2.0 fwd/back 2' each   Ball on Wall Shoulder circles with small ball at 90dg flexion CW/CCW 2# cuff wt x15 each     Manual Therapy   Manual Therapy Joint mobilization;Soft  tissue mobilization;Myofascial release;Taping   Joint Mobilization R shoulder grade 3-4 inf & A/P mobs   Soft tissue mobilization R ant deltoid, pecs & biceps   Myofascial Release TPR R ant deltoid & pecs   Kinesiotex Inhibit Muscle     Kinesiotix   Inhibit Muscle  R deltoid inhibition                  PT Short Term Goals - 01/23/17 0951      PT SHORT TERM GOAL #1   Title R shoulder PROM forward flexion to 140, ER at side to 40, & ABD without rotation to 80 by 01/24/17    Status Achieved  PT SHORT TERM GOAL #2   Title independent with basic HEP by 01/17/17   Status Achieved           PT Long Term Goals - 03/06/17 0958      PT LONG TERM GOAL #1   Title R shoulder AROM WFL by 05/09/17   Status Revised  met for gravity minimized AROM; nearly met for AROM against gravity     PT LONG TERM GOAL #2   Title pt is able to return to all ADLs, chores, and work duties without limitation by shoulder pain, LOM, or weakness by 05/09/17.   Status Revised     PT LONG TERM GOAL #3   Title pt able to sleep without limitation by shoulder pain by 05/09/17   Status Revised     PT LONG TERM GOAL #4   Title R shoulder MMT >/= to 4/5 by 05/09/17   Status Revised               Plan - 03/06/17 0825    Clinical Impression Statement Pt reporting MD wanting him to continue PT for another 8 weeks, therefore will recert to extend POC. Pt noting increased pain in anterior/superior shoulder near coracoid after MD visit yesterday. Palpation revealed increased muscle tension and TP in anterior deltoid and pecs which responded somewhat to manual therapy with pt reporting pain lessened but not completely resolved, however pt still demonstrating increased shoulder hiking during exercises due to pain, therefore applied kinesiotape to promote further muscle relaxation and minimize substitution. If pain persists, may benefit from trial of ionto patch.   Rehab Potential Good   Clinical  Impairments Affecting Rehab Potential history of multiple bilateral RTC surgeries; history of R elbow surgery   PT Frequency 2x / week   PT Duration 8 weeks   PT Treatment/Interventions Patient/family education;ADLs/Self Care Home Management;Passive range of motion;Manual techniques;Therapeutic exercise;Therapeutic activities;Cryotherapy;Vasopneumatic Device;Moist Heat;Electrical Stimulation;Scar mobilization;Taping;Iontophoresis 63m/ml Dexamethasone   PT Next Visit Plan Gradually progress scapular stabilization & RTC strengthening per tolerance & protocol; Manual therapy for STM/MFR, PROM & joint mobs as indicated for pain and ROMl; Modalities PRN   Consulted and Agree with Plan of Care Patient      Patient will benefit from skilled therapeutic intervention in order to improve the following deficits and impairments:  Decreased range of motion, Decreased strength, Pain, Impaired UE functional use, Decreased activity tolerance, Decreased scar mobility, Postural dysfunction  Visit Diagnosis: Right shoulder pain, unspecified chronicity - Plan: PT plan of care cert/re-cert  Stiffness of right shoulder, not elsewhere classified - Plan: PT plan of care cert/re-cert  Abnormal posture - Plan: PT plan of care cert/re-cert  Muscle weakness (generalized) - Plan: PT plan of care cert/re-cert     Problem List Patient Active Problem List   Diagnosis Date Noted  . Dyslipidemia (high LDL; low HDL) 08/15/2016  . Complete rotator cuff tear of left shoulder 07/05/2015  . Tennis elbow 05/30/2014  . Rotator cuff (capsule) sprain 03/10/2014  . Right shoulder pain 02/11/2014  . Memory difficulty 12/18/2012  . Gynecomastia, male 07/30/2012  . OSA (obstructive sleep apnea) 04/23/2011  . EXTERNAL HEMORRHOIDS 02/12/2011  . [place]state cancer ([place] 11/12/2010  . PLANTAR FASCIITIS 04/09/2010  . Hypothyroidism 12/08/2009  . NEOPLASM OF UNCERTAIN BEHAVIOR OF SKIN 03/10/2009  . OSTEOARTHRITIS 02/14/2009  .  HEARING DEFICIT 02/08/2009  . Essential hypertension 02/08/2009  . ALLERGIC RHINITIS 02/08/2009  . GERD 02/08/2009  . PROTEINURIA 02/08/2009  . DIVERTICULITIS, HX OF 02/08/2009  Percival Spanish, PT, MPT 03/06/2017, 10:00 AM  [facility] [address]  [place], [place], [address] Phone: [phone]   Fax:  [phone]  Name: Zachary [name] MRN: [id number] Date of Birth: 12-02-1951

## 2017-03-10 MED FILL — CMP-QDRL/LIDO/MYLAN/NYS1111: 3 days supply | Qty: 120 | Fill #0

## 2017-03-11 ENCOUNTER — Ambulatory Visit: Payer: 59

## 2017-03-11 ENCOUNTER — Encounter: Payer: Self-pay | Admitting: Family Medicine

## 2017-03-11 ENCOUNTER — Other Ambulatory Visit: Payer: Self-pay | Admitting: *Deleted

## 2017-03-11 DIAGNOSIS — R293 Abnormal posture: Secondary | ICD-10-CM | POA: Diagnosis not present

## 2017-03-11 DIAGNOSIS — M25611 Stiffness of right shoulder, not elsewhere classified: Secondary | ICD-10-CM

## 2017-03-11 DIAGNOSIS — M6281 Muscle weakness (generalized): Secondary | ICD-10-CM

## 2017-03-11 DIAGNOSIS — M25511 Pain in right shoulder: Secondary | ICD-10-CM | POA: Diagnosis not present

## 2017-03-11 MED ORDER — PRAVASTATIN SODIUM 40 MG PO TABS: 20.0000 mg | ORAL_TABLET | Freq: Every day | ORAL | 2 refills | Status: DC

## 2017-03-11 MED FILL — PRAVASTATIN NA 40 MG TAB: 40 | 90 days supply | Qty: 45 | Fill #0

## 2017-03-11 NOTE — Patient Instructions (Signed)

## 2017-03-11 NOTE — Telephone Encounter (Signed)
Rx sent to the pharmacy by e-script.//AB/CMA 

## 2017-03-11 NOTE — Therapy (Signed)
[facility] [address]  [place], [place], [address] Phone: [phone]   Fax:  [phone]  Physical Therapy Treatment  Patient Details  Name: Zachary [name] MRN: [id number] Date of Birth: [birth year] Referring Provider: Dr. Corky Mull  Encounter Date: 03/11/2017      PT End of Session - 03/11/17 0806    Visit Number 20   Number of Visits 38   Date for PT Re-Evaluation 05/09/17   PT Start Time 1829   PT Stop Time 0846   PT Time Calculation (min) 49 min   Activity Tolerance Patient tolerated treatment well   Behavior During Therapy [facility] for tasks assessed/performed      Past Medical History:  Diagnosis Date  . Arthritis   . Articular cartilage disease    left shoulder  . Cancer ([place]) 2015   positive prostate cancer bx  . Diverticulitis   . Dyslipidemia (high LDL; low HDL) 08/15/2016  . GERD (gastroesophageal reflux disease)   . Hearing problem    hearing deficit  . Hemorrhoids   . Hypertension   . Hypothyroidism   . Sleep apnea    uses a cpap  . Wears glasses   . Wears hearing aid    both ears    Past Surgical History:  Procedure Laterality Date  . BACK SURGERY  1978   herniated disksurgery  . CARDIAC CATHETERIZATION  08/01/2010  . COLONOSCOPY    . EXAM UNDER ANESTHESIA WITH MANIPULATION OF SHOULDER Right 09/15/2014   Procedure: RIGHT SHOULDER MANIPULATION UNDER ANESTHESIA;  Surgeon: Ninetta Lights, MD;  Location: [place];  Service: Orthopedics;  Laterality: Right;  . HEMORRHOID SURGERY  08/2006  . SHOULDER ARTHROSCOPY WITH DISTAL CLAVICLE RESECTION Left 07/07/2015   Procedure: SHOULDER ARTHROSCOPY WITH DISTAL CLAVICLE RESECTION;  Surgeon: Kathryne Hitch, MD;  Location: [place];  Service: Orthopedics;  Laterality: Left;  . SHOULDER ARTHROSCOPY WITH ROTATOR CUFF REPAIR Left 12/07/2015   Procedure: LEFT SHOULDER ARTHROSCOPY DEBRIDEMENT, WITH ROTATOR CUFF  REPAIR;  Surgeon: Ninetta Lights, MD;  Location: [place];  Service: Orthopedics;  Laterality: Left;  . SHOULDER ARTHROSCOPY WITH ROTATOR CUFF REPAIR AND SUBACROMIAL DECOMPRESSION Left 07/07/2015   Procedure: LEFT SHOULDER SCOPE DEBRIDEMENT, SUBACROMIAL DECOMPRESSION, DISTAL CLAVICULECTOMY, ROTATOR CUFF REPAIR  ;  Surgeon: Kathryne Hitch, MD;  Location: [place];  Service: Orthopedics;  Laterality: Left;  ANESTHESIA: GENERAL, PRE/POST OP SCALENE  . SHOULDER ARTHROSCOPY WITH SUBACROMIAL DECOMPRESSION, ROTATOR CUFF REPAIR AND BICEP TENDON REPAIR Right 03/10/2014   Procedure: RIGHT SHOULDER ARTHROSCOPY WITH SUBACROMIAL DECOMPRESSION, PARTIAL ACROMIOPLASTY WITH CORACOAROMIAL LABRUM DEBRIDEMENT RELEASE DISTAL CLAVICULECTOMY,  ROTATOR CUFF REPAIR AND EXTENSIVE DEBRIDEMENT;  Surgeon: Ninetta Lights, MD;  Location: [place];  Service: Orthopedics;  Laterality: Right;  . TENDON REPAIR  June 06, 2011   right elbow, Dr. Percell Miller    There were no vitals filed for this visit.      Subjective Assessment - 03/11/17 0801    Subjective Pt. reporting he woke up this morning at 2am with pain in shoulder.     Patient Stated Goals "get back to work"    Currently in Pain? Yes   Pain Score 4    Pain Location Shoulder   Pain Orientation Right   Pain Descriptors / Indicators Sharp   Pain Type Surgical pain   Pain Radiating Towards n/a    Pain Onset More than a month ago   Pain  Frequency Intermittent   Aggravating Factors  Weather    Pain Relieving Factors resting,    Multiple Pain Sites No            [facility] PT Assessment - 03/12/17 1213      Observation/Other Assessments   Focus on Therapeutic Outcomes (FOTO)  50% (50% limitation)                      [facility] Adult PT Treatment/Exercise - 03/11/17 0808      Shoulder Exercises: Supine   Protraction Right;Weights;20 reps   Protraction Weight (lbs) 5     Shoulder Exercises: Standing    Flexion Both;15 reps;Weights  red TB flexion x 15 rep; tactile cueing to prevent hiking   Shoulder Flexion Weight (lbs) 2   Flexion Limitations standing against 1/2 FR on wall   ABduction Both;Weights;15 reps  scaption    Shoulder ABduction Weight (lbs) 2   ABduction Limitations slight scaption; standing against 1/2 FR on wall  tactile cueing to prevent shoulder hike    Extension Right;Theraband;10 reps  R shoulder only    Theraband Level (Shoulder Extension) Level 3 (Green)   Other Standing Exercises Standing R shoulder long arc wall wash x 15 reps each     Shoulder Exercises: Therapy Ball   Flexion 15 reps  Pt. confirming pain increase and challange with this    Flexion Limitations 2# cuff wt on R; standing roll-up with orange (55 cm) Pball on wall with stretch at end ROM   ABduction 15 reps  pt. confirming pain increase and challange with this    ABduction Limitations 2# cuff wt on R; standing roll-up with orange (55 cm) Pball on wall with stretch at end ROM     Shoulder Exercises: ROM/Strengthening   UBE (Upper Arm Bike) lvl 2.5 fwd/back 2' each     Iontophoresis   Type of Iontophoresis Dexamethasone  patch #1/6    Location R anterior shoulder   Dose 1.0 mL, 80 mA/min    Time 4-6 hrs wear time     Manual Therapy   Manual Therapy Joint mobilization;Soft tissue mobilization   Joint Mobilization R shoulder grade 3-4 inf & A/P mobs for improved ROM    Soft tissue mobilization R ant deltoid, pecs STM; Pec stretch laying on 1/2 foam bolster with therapist                 PT Education - 03/11/17 1234    Education provided Yes   Education Details Iontophoresis precautions, wear time dicusssed with pt. today; Iontophoresis precautions/contraindications issued to pt. via handout    Person(s) Educated Patient   Methods Explanation;Demonstration;Verbal cues;Handout   Comprehension Verbalized understanding;Verbal cues required          PT Short Term Goals - 01/23/17  0951      PT SHORT TERM GOAL #1   Title R shoulder PROM forward flexion to 140, ER at side to 40, & ABD without rotation to 80 by 01/24/17    Status Achieved     PT SHORT TERM GOAL #2   Title independent with basic HEP by 01/17/17   Status Achieved           PT Long Term Goals - 03/06/17 0958      PT LONG TERM GOAL #1   Title R shoulder AROM WFL by 05/09/17   Status Revised  met for gravity minimized AROM; nearly met for AROM against gravity  PT LONG TERM GOAL #2   Title pt is able to return to all ADLs, chores, and work duties without limitation by shoulder pain, LOM, or weakness by 05/09/17.   Status Revised     PT LONG TERM GOAL #3   Title pt able to sleep without limitation by shoulder pain by 05/09/17   Status Revised     PT LONG TERM GOAL #4   Title R shoulder MMT >/= to 4/5 by 05/09/17   Status Revised               Plan - 03/11/17 0817    Clinical Impression Statement Pt. reporting poor sleep last night due to R shoulder pain waking him at 2am.  Pt. still with report of increased R anterior shoulder pain unable to notice improvement with taping.  Today's treatment with continued STM to R shoulder and cueing to prevent hiking with therex.  Pt. with difficulty preventing shoulder hike with flexion strengthening activity today.  Only mild progression with gravity resisted strengthening therex due to report of moderate pain today.  Shoulder pain able to recover to baseline following all therex activities.  Pt. declining ice to end treatment.  Trial of iontophoresis initiated today to decrease R shoulder swelling and pain.  Pt. will continue to benefit from further skilled therapy to maximize UE strength, ROM, and function.   PT Treatment/Interventions Patient/family education;ADLs/Self Care Home Management;Passive range of motion;Manual techniques;Therapeutic exercise;Therapeutic activities;Cryotherapy;Vasopneumatic Device;Moist Heat;Electrical Stimulation;Scar  mobilization;Taping;Iontophoresis 33m/ml Dexamethasone   PT Next Visit Plan Gradually progress scapular stabilization & RTC strengthening per tolerance & protocol; Manual therapy for STM/MFR, PROM & joint mobs as indicated for pain and ROMl; Modalities PRN      Patient will benefit from skilled therapeutic intervention in order to improve the following deficits and impairments:  Decreased range of motion, Decreased strength, Pain, Impaired UE functional use, Decreased activity tolerance, Decreased scar mobility, Postural dysfunction  Visit Diagnosis: Right shoulder pain, unspecified chronicity  Stiffness of right shoulder, not elsewhere classified  Abnormal posture  Muscle weakness (generalized)     Problem List Patient Active Problem List   Diagnosis Date Noted  . Dyslipidemia (high LDL; low HDL) 08/15/2016  . Complete rotator cuff tear of left shoulder 07/05/2015  . Tennis elbow 05/30/2014  . Rotator cuff (capsule) sprain 03/10/2014  . Right shoulder pain 02/11/2014  . Memory difficulty 12/18/2012  . Gynecomastia, male 07/30/2012  . OSA (obstructive sleep apnea) 04/23/2011  . EXTERNAL HEMORRHOIDS 02/12/2011  . Prostate cancer ([place] 11/12/2010  . PLANTAR FASCIITIS 04/09/2010  . Hypothyroidism 12/08/2009  . NEOPLASM OF UNCERTAIN BEHAVIOR OF SKIN 03/10/2009  . OSTEOARTHRITIS 02/14/2009  . HEARING DEFICIT 02/08/2009  . Essential hypertension 02/08/2009  . ALLERGIC RHINITIS 02/08/2009  . GERD 02/08/2009  . PROTEINURIA 02/08/2009  . DIVERTICULITIS, HX OF 02/08/2009    MBess Harvest PTA 03/12/17 12:18 PM   [facility] [address] [place] [address]Phone: [phone]  Fax:  [phone] Name: Zachary KARGBOMRN: [id number] of Birth: 117-Jul-1952

## 2017-03-13 ENCOUNTER — Ambulatory Visit: Payer: 59

## 2017-03-13 DIAGNOSIS — M25611 Stiffness of right shoulder, not elsewhere classified: Secondary | ICD-10-CM | POA: Diagnosis not present

## 2017-03-13 DIAGNOSIS — R293 Abnormal posture: Secondary | ICD-10-CM | POA: Diagnosis not present

## 2017-03-13 DIAGNOSIS — M25511 Pain in right shoulder: Secondary | ICD-10-CM | POA: Diagnosis not present

## 2017-03-13 DIAGNOSIS — M6281 Muscle weakness (generalized): Secondary | ICD-10-CM

## 2017-03-13 MED FILL — METHYLPREDNISOLONE 4 MG TAB: 4 | 6 days supply | Qty: 21 | Fill #0

## 2017-03-13 MED FILL — CMP-QDRL/LIDO/MYLAN/NYS1111: 3 days supply | Qty: 120 | Fill #1

## 2017-03-13 NOTE — Therapy (Signed)
[facility] [address]  [place], [place], [address] Phone: [phone]   Fax:  [phone]  Physical Therapy Treatment  Patient Details  Name: Zachary [name] MRN: [id number] Date of Birth: Aug 03, 1951 Referring Provider: Dr. Corky Mull  Encounter Date: 03/13/2017      PT End of Session - 03/13/17 1020    Visit Number 21   Number of Visits 38   Date for PT Re-Evaluation 05/09/17   PT Start Time 1016   PT Stop Time 1056   PT Time Calculation (min) 40 min   Activity Tolerance Patient tolerated treatment well   Behavior During Therapy [facility] for tasks assessed/performed      Past Medical History:  Diagnosis Date  . Arthritis   . Articular cartilage disease    left shoulder  . Cancer ([place]) 2015   positive prostate cancer bx  . Diverticulitis   . Dyslipidemia (high LDL; low HDL) 08/15/2016  . GERD (gastroesophageal reflux disease)   . Hearing problem    hearing deficit  . Hemorrhoids   . Hypertension   . Hypothyroidism   . Sleep apnea    uses a cpap  . Wears glasses   . Wears hearing aid    both ears    Past Surgical History:  Procedure Laterality Date  . BACK SURGERY  1978   herniated disksurgery  . CARDIAC CATHETERIZATION  08/01/2010  . COLONOSCOPY    . EXAM UNDER ANESTHESIA WITH MANIPULATION OF SHOULDER Right 09/15/2014   Procedure: RIGHT SHOULDER MANIPULATION UNDER ANESTHESIA;  Surgeon: Ninetta Lights, MD;  Location: [place];  Service: Orthopedics;  Laterality: Right;  . HEMORRHOID SURGERY  08/2006  . SHOULDER ARTHROSCOPY WITH DISTAL CLAVICLE RESECTION Left 07/07/2015   Procedure: SHOULDER ARTHROSCOPY WITH DISTAL CLAVICLE RESECTION;  Surgeon: Kathryne Hitch, MD;  Location: [place];  Service: Orthopedics;  Laterality: Left;  . SHOULDER ARTHROSCOPY WITH ROTATOR CUFF REPAIR Left 12/07/2015   Procedure: LEFT SHOULDER ARTHROSCOPY DEBRIDEMENT, WITH ROTATOR CUFF  REPAIR;  Surgeon: Ninetta Lights, MD;  Location: [place];  Service: Orthopedics;  Laterality: Left;  . SHOULDER ARTHROSCOPY WITH ROTATOR CUFF REPAIR AND SUBACROMIAL DECOMPRESSION Left 07/07/2015   Procedure: LEFT SHOULDER SCOPE DEBRIDEMENT, SUBACROMIAL DECOMPRESSION, DISTAL CLAVICULECTOMY, ROTATOR CUFF REPAIR  ;  Surgeon: Kathryne Hitch, MD;  Location: [place];  Service: Orthopedics;  Laterality: Left;  ANESTHESIA: GENERAL, PRE/POST OP SCALENE  . SHOULDER ARTHROSCOPY WITH SUBACROMIAL DECOMPRESSION, ROTATOR CUFF REPAIR AND BICEP TENDON REPAIR Right 03/10/2014   Procedure: RIGHT SHOULDER ARTHROSCOPY WITH SUBACROMIAL DECOMPRESSION, PARTIAL ACROMIOPLASTY WITH CORACOAROMIAL LABRUM DEBRIDEMENT RELEASE DISTAL CLAVICULECTOMY,  ROTATOR CUFF REPAIR AND EXTENSIVE DEBRIDEMENT;  Surgeon: Ninetta Lights, MD;  Location: [place];  Service: Orthopedics;  Laterality: Right;  . TENDON REPAIR  June 06, 2011   right elbow, Dr. Percell Miller    There were no vitals filed for this visit.      Subjective Assessment - 03/13/17 1018    Subjective Pt. painted for 30 min overhead with R shoulder with difficulty last night.  Lower initial pain today however.      Patient Stated Goals "get back to work"    Currently in Pain? Yes   Pain Score 2    Pain Location Shoulder   Pain Orientation Right   Pain Descriptors / Indicators Sharp   Pain Type Surgical pain   Multiple Pain Sites No  [facility] Adult PT Treatment/Exercise - 03/13/17 1019      Shoulder Exercises: Standing   Flexion Both;15 reps;Weights  mirror feedback to reduce scap. elevation   Shoulder Flexion Weight (lbs) 2   Flexion Limitations Standing leaning on 6" bolster    ABduction Both;Weights;15 reps  scaption; mirror feedback to reduce scap. elevation   Shoulder ABduction Weight (lbs) 2   ABduction Limitations slight scaption; Standing leaning on 6" bolster    Other  Standing Exercises Standing R shoulder long arc wall wash x 20 reps each     Shoulder Exercises: Pulleys   Flexion 2 minutes   ABduction 2 minutes   ABduction Limitations scaption/abduction     Shoulder Exercises: Therapy Ball   Flexion 15 reps  less scap. elevation today; less pain    Flexion Limitations 2# cuff wt on R; standing roll-up with orange (55 cm) Pball on wall with stretch at end ROM   ABduction 15 reps  less scap. elevation; less pain    ABduction Limitations 2# cuff wt on R; standing roll-up with orange (55 cm) Pball on wall with stretch at end ROM     Shoulder Exercises: ROM/Strengthening   UBE (Upper Arm Bike) lvl 3.0 fwd/back 3' each   Ball on Wall Shoulder circles with small ball above shoulder heightflexion, scaption CW/CCW 2# cuff wt x15 each     Manual Therapy   Manual Therapy Joint mobilization;Soft tissue mobilization   Joint Mobilization R shoulder grade 3-4 inf & A/P mobs for improved ROM    Soft tissue mobilization STM to R ant deltoid, pecs, R teres minor in passive flexion stretch position                  PT Short Term Goals - 01/23/17 0951      PT SHORT TERM GOAL #1   Title R shoulder PROM forward flexion to 140, ER at side to 40, & ABD without rotation to 80 by 01/24/17    Status Achieved     PT SHORT TERM GOAL #2   Title independent with basic HEP by 01/17/17   Status Achieved           PT Long Term Goals - 03/06/17 0958      PT LONG TERM GOAL #1   Title R shoulder AROM WFL by 05/09/17   Status Revised  met for gravity minimized AROM; nearly met for AROM against gravity     PT LONG TERM GOAL #2   Title pt is able to return to all ADLs, chores, and work duties without limitation by shoulder pain, LOM, or weakness by 05/09/17.   Status Revised     PT LONG TERM GOAL #3   Title pt able to sleep without limitation by shoulder pain by 05/09/17   Status Revised     PT LONG TERM GOAL #4   Title R shoulder MMT >/= to 4/5 by 05/09/17    Status Revised               Plan - 03/13/17 1102    Clinical Impression Statement Pt. with less overall pain and decreased scap. elevation with therex today.  Reps progressed with overhead strengthening activity today.  Wall walk with yellow TB progressed to over shoulder height today.  Pt. able to self-correct scap. elevation with flexion, scaption motions with mirror feedback.  STM to R Anterior Deltoid, Teres Minor, posterior shoulder musculature to promote muscular relaxation.  Pt. will continue to benefit from further  skilled therapy to maximize UE strength, ROM, and function.     PT Treatment/Interventions Patient/family education;ADLs/Self Care Home Management;Passive range of motion;Manual techniques;Therapeutic exercise;Therapeutic activities;Cryotherapy;Vasopneumatic Device;Moist Heat;Electrical Stimulation;Scar mobilization;Taping;Iontophoresis 65m/ml Dexamethasone   PT Next Visit Plan Gradually progress scapular stabilization & RTC strengthening per tolerance & protocol; Manual therapy for STM/MFR, PROM & joint mobs as indicated for pain and ROMl; Modalities PRN      Patient will benefit from skilled therapeutic intervention in order to improve the following deficits and impairments:  Decreased range of motion, Decreased strength, Pain, Impaired UE functional use, Decreased activity tolerance, Decreased scar mobility, Postural dysfunction  Visit Diagnosis: Right shoulder pain, unspecified chronicity  Stiffness of right shoulder, not elsewhere classified  Abnormal posture  Muscle weakness (generalized)     Problem List Patient Active Problem List   Diagnosis Date Noted  . Dyslipidemia (high LDL; low HDL) 08/15/2016  . Complete rotator cuff tear of left shoulder 07/05/2015  . Tennis elbow 05/30/2014  . Rotator cuff (capsule) sprain 03/10/2014  . Right shoulder pain 02/11/2014  . Memory difficulty 12/18/2012  . Gynecomastia, male 07/30/2012  . OSA (obstructive  sleep apnea) 04/23/2011  . EXTERNAL HEMORRHOIDS 02/12/2011  . [place] ([place] 11/12/2010  . PLANTAR FASCIITIS 04/09/2010  . Hypothyroidism 12/08/2009  . NEOPLASM OF UNCERTAIN BEHAVIOR OF SKIN 03/10/2009  . OSTEOARTHRITIS 02/14/2009  . HEARING DEFICIT 02/08/2009  . Essential hypertension 02/08/2009  . ALLERGIC RHINITIS 02/08/2009  . GERD 02/08/2009  . PROTEINURIA 02/08/2009  . DIVERTICULITIS, HX OF 02/08/2009    MBess Harvest PTA 03/13/17 12:21 PM   [facility] [address] [place] [address]Phone: [phone]  Fax:  [phone] Name: Zachary SERMONSMRN: [id number] of Birth: [birth year]

## 2017-03-18 ENCOUNTER — Ambulatory Visit: Payer: 59 | Admitting: Physical Therapy

## 2017-03-18 DIAGNOSIS — M6281 Muscle weakness (generalized): Secondary | ICD-10-CM | POA: Diagnosis not present

## 2017-03-18 DIAGNOSIS — M25511 Pain in right shoulder: Secondary | ICD-10-CM | POA: Diagnosis not present

## 2017-03-18 DIAGNOSIS — M25611 Stiffness of right shoulder, not elsewhere classified: Secondary | ICD-10-CM | POA: Diagnosis not present

## 2017-03-18 DIAGNOSIS — R293 Abnormal posture: Secondary | ICD-10-CM | POA: Diagnosis not present

## 2017-03-18 NOTE — Therapy (Signed)
[facility] [address]  [place], [place], [address] Phone: [phone]   Fax:  [phone]  Physical Therapy Treatment  Patient Details  Name: Zachary [name] MRN: [id number] Date of Birth: [birth year] Referring Provider: Dr. Corky Mull  Encounter Date: 03/18/2017      PT End of Session - 03/18/17 1015    Visit Number 22   Number of Visits 38   Date for PT Re-Evaluation 05/09/17   PT Start Time 1700   PT Stop Time 1058   PT Time Calculation (min) 43 min   Activity Tolerance Patient tolerated treatment well   Behavior During Therapy [facility] for tasks assessed/performed      Past Medical History:  Diagnosis Date  . Arthritis   . Articular cartilage disease    left shoulder  . Cancer ([place]) 2015   positive prostate cancer bx  . Diverticulitis   . Dyslipidemia (high LDL; low HDL) 08/15/2016  . GERD (gastroesophageal reflux disease)   . Hearing problem    hearing deficit  . Hemorrhoids   . Hypertension   . Hypothyroidism   . Sleep apnea    uses a cpap  . Wears glasses   . Wears hearing aid    both ears    Past Surgical History:  Procedure Laterality Date  . BACK SURGERY  1978   herniated disksurgery  . CARDIAC CATHETERIZATION  08/01/2010  . COLONOSCOPY    . EXAM UNDER ANESTHESIA WITH MANIPULATION OF SHOULDER Right 09/15/2014   Procedure: RIGHT SHOULDER MANIPULATION UNDER ANESTHESIA;  Surgeon: Ninetta Lights, MD;  Location: [facility];  Service: Orthopedics;  Laterality: Right;  . HEMORRHOID SURGERY  08/2006  . SHOULDER ARTHROSCOPY WITH DISTAL CLAVICLE RESECTION Left 07/07/2015   Procedure: SHOULDER ARTHROSCOPY WITH DISTAL CLAVICLE RESECTION;  Surgeon: Kathryne Hitch, MD;  Location: [facility];  Service: Orthopedics;  Laterality: Left;  . SHOULDER ARTHROSCOPY WITH ROTATOR CUFF REPAIR Left 12/07/2015   Procedure: LEFT SHOULDER ARTHROSCOPY DEBRIDEMENT, WITH ROTATOR CUFF  REPAIR;  Surgeon: Ninetta Lights, MD;  Location: [facility];  Service: Orthopedics;  Laterality: Left;  . SHOULDER ARTHROSCOPY WITH ROTATOR CUFF REPAIR AND SUBACROMIAL DECOMPRESSION Left 07/07/2015   Procedure: LEFT SHOULDER SCOPE DEBRIDEMENT, SUBACROMIAL DECOMPRESSION, DISTAL CLAVICULECTOMY, ROTATOR CUFF REPAIR  ;  Surgeon: Kathryne Hitch, MD;  Location: [facility];  Service: Orthopedics;  Laterality: Left;  ANESTHESIA: GENERAL, PRE/POST OP SCALENE  . SHOULDER ARTHROSCOPY WITH SUBACROMIAL DECOMPRESSION, ROTATOR CUFF REPAIR AND BICEP TENDON REPAIR Right 03/10/2014   Procedure: RIGHT SHOULDER ARTHROSCOPY WITH SUBACROMIAL DECOMPRESSION, PARTIAL ACROMIOPLASTY WITH CORACOAROMIAL LABRUM DEBRIDEMENT RELEASE DISTAL CLAVICULECTOMY,  ROTATOR CUFF REPAIR AND EXTENSIVE DEBRIDEMENT;  Surgeon: Ninetta Lights, MD;  Location: [facility];  Service: Orthopedics;  Laterality: Right;  . TENDON REPAIR  June 06, 2011   right elbow, Dr. Percell Miller    There were no vitals filed for this visit.      Subjective Assessment - 03/18/17 1018    Subjective Pt reports new onset of L LBP last Friday, but not sure of the triggering event. Pain still present in back, but lessening.   Patient Stated Goals "get back to work"    Currently in Pain? Yes   Pain Score 0-No pain   Pain Location Shoulder   Pain Score 7   Pain Location Back   Pain Orientation Left;Lower   Pain Descriptors / Indicators Sharp  "knotted"   Pain Type Acute  pain   Pain Onset In the past 7 days   Pain Frequency Constant                         [facility] Adult PT Treatment/Exercise - 03/18/17 1015      Shoulder Exercises: Prone   Extension Both;15 reps;Weights  3" hold   Extension Weight (lbs) 2   Extension Limitations "I" - prone over green Pball   External Rotation Both;15 reps;Weights  3" hold   External Rotation Limitations "W" - prone over green Pball   Horizontal ABduction 1 Both;15  reps;Weights  3" hold   Horizontal ABduction 1 Weight (lbs) 2   Horizontal ABduction 1 Limitations "T" - prone over green Pball   Horizontal ABduction 2 Both;15 reps;Weights  3" hold   Horizontal ABduction 2 Weight (lbs) 1   Horizontal ABduction 2 Limitations "Y" - prone over green Pball     Shoulder Exercises: Sidelying   External Rotation Right;15 reps;Weights   External Rotation Weight (lbs) 2   ABduction Right;15 reps;Weights   ABduction Weight (lbs) 2   ABduction Limitations 20-130 dg (painfree range)     Shoulder Exercises: Standing   External Rotation Right;15 reps   Theraband Level (Shoulder External Rotation) Level 2 (Red)   External Rotation Limitations neutral shoulder; green TB attempted but pt demostrating excessive substitution   Internal Rotation Right;15 reps;Theraband   Theraband Level (Shoulder Internal Rotation) Level 3 (Green)   Internal Rotation Limitations neutral shoulder   Flexion Both;15 reps;Weights  2 sets - 1 w/ TB & 1 w/ weights   Theraband Level (Shoulder Flexion) Level 2 (Red)   Shoulder Flexion Weight (lbs) 2   Flexion Limitations standing against 6" FR on wall   ABduction Both;15 reps;Weights  2 sets - 1 w/ TB & 1 w/ weights   Theraband Level (Shoulder ABduction) Level 2 (Red)   Shoulder ABduction Weight (lbs) 2   ABduction Limitations slight scaption; standing against 6" FR on wall   Other Standing Exercises Placing weights to cabinet 1st & 2nd shelf 2# x15     Shoulder Exercises: Pulleys   Flexion 3 minutes   ABduction 3 minutes   ABduction Limitations scaption/abduction     Shoulder Exercises: ROM/Strengthening   UBE (Upper Arm Bike) lvl 3.0 fwd/back 3' each                PT Education - 03/18/17 1103    Education provided Yes   Education Details HEP update - shoulder IR progressed to green TB, flexion & abduction with red TB added to HEP   Person(s) Educated Patient   Methods Explanation;Demonstration   Comprehension  Verbalized understanding;Returned demonstration          PT Short Term Goals - 01/23/17 0951      PT SHORT TERM GOAL #1   Title R shoulder PROM forward flexion to 140, ER at side to 40, & ABD without rotation to 80 by 01/24/17    Status Achieved     PT SHORT TERM GOAL #2   Title independent with basic HEP by 01/17/17   Status Achieved           PT Long Term Goals - 03/06/17 0958      PT LONG TERM GOAL #1   Title R shoulder AROM WFL by 05/09/17   Status Revised  met for gravity minimized AROM; nearly met for AROM against gravity     PT LONG TERM GOAL #  2   Title pt is able to return to all ADLs, chores, and work duties without limitation by shoulder pain, LOM, or weakness by 05/09/17.   Status Revised     PT LONG TERM GOAL #3   Title pt able to sleep without limitation by shoulder pain by 05/09/17   Status Revised     PT LONG TERM GOAL #4   Title R shoulder MMT >/= to 4/5 by 05/09/17   Status Revised               Plan - 03/18/17 1023    Clinical Impression Statement Pt demostrating improving overhead ROM in R flexion and abduction with decreasing scapular elevation or substitution. Pt reporting back pain unrelated to shoulder rehab upon arrival to therapy today which did not limit therapy and pt felt was improved after completing shoulder rehab exercises. Pt able to progress therapeutic exercises today, but contined fatigue noted by end of most sets.   Rehab Potential Good   Clinical Impairments Affecting Rehab Potential history of multiple bilateral RTC surgeries; history of R elbow surgery   PT Treatment/Interventions Patient/family education;ADLs/Self Care Home Management;Passive range of motion;Manual techniques;Therapeutic exercise;Therapeutic activities;Cryotherapy;Vasopneumatic Device;Moist Heat;Electrical Stimulation;Scar mobilization;Taping;Iontophoresis 31m/ml Dexamethasone   PT Next Visit Plan Gradually progress scapular stabilization & RTC strengthening per  tolerance & protocol; Manual therapy for STM/MFR, PROM & joint mobs as indicated for pain and ROM; Modalities PRN   Consulted and Agree with Plan of Care Patient      Patient will benefit from skilled therapeutic intervention in order to improve the following deficits and impairments:  Decreased range of motion, Decreased strength, Pain, Impaired UE functional use, Decreased activity tolerance, Decreased scar mobility, Postural dysfunction  Visit Diagnosis: Right shoulder pain, unspecified chronicity  Stiffness of right shoulder, not elsewhere classified  Abnormal posture  Muscle weakness (generalized)     Problem List Patient Active Problem List   Diagnosis Date Noted  . Dyslipidemia (high LDL; low HDL) 08/15/2016  . Complete rotator cuff tear of left shoulder 07/05/2015  . Tennis elbow 05/30/2014  . Rotator cuff (capsule) sprain 03/10/2014  . Right shoulder pain 02/11/2014  . Memory difficulty 12/18/2012  . Gynecomastia, male 07/30/2012  . OSA (obstructive sleep apnea) 04/23/2011  . EXTERNAL HEMORRHOIDS 02/12/2011  . Prostate cancer ([place] 11/12/2010  . PLANTAR FASCIITIS 04/09/2010  . Hypothyroidism 12/08/2009  . NEOPLASM OF UNCERTAIN BEHAVIOR OF SKIN 03/10/2009  . OSTEOARTHRITIS 02/14/2009  . HEARING DEFICIT 02/08/2009  . Essential hypertension 02/08/2009  . ALLERGIC RHINITIS 02/08/2009  . GERD 02/08/2009  . PROTEINURIA 02/08/2009  . DIVERTICULITIS, HX OF 02/08/2009    JPercival Spanish PT, MPT 03/18/2017, 11:05 AM  [facility] [place] [address]Phone: [phone]  Fax:  [phone] Name: FTAMI BARRENMRN: [id number] of Birth: 101/11/1951

## 2017-03-19 MED FILL — CMP-QDRL/LIDO/MYLAN/NYS1111: 3 days supply | Qty: 120 | Fill #2

## 2017-03-20 ENCOUNTER — Ambulatory Visit: Payer: 59

## 2017-03-24 ENCOUNTER — Ambulatory Visit (INDEPENDENT_AMBULATORY_CARE_PROVIDER_SITE_OTHER): Payer: 59 | Admitting: Family Medicine

## 2017-03-24 ENCOUNTER — Encounter: Payer: Self-pay | Admitting: Family Medicine

## 2017-03-24 VITALS — BP 128/80 | HR 101 | Temp 97.8°F | Ht 68.0 in | Wt 206.0 lb

## 2017-03-24 DIAGNOSIS — M545 Low back pain, unspecified: Secondary | ICD-10-CM

## 2017-03-24 DIAGNOSIS — R7303 Prediabetes: Secondary | ICD-10-CM

## 2017-03-24 DIAGNOSIS — E039 Hypothyroidism, unspecified: Secondary | ICD-10-CM | POA: Diagnosis not present

## 2017-03-24 LAB — POC URINALSYSI DIPSTICK (AUTOMATED)
Bilirubin, UA: NEGATIVE
Glucose, UA: NEGATIVE
KETONES UA: NEGATIVE
LEUKOCYTES UA: NEGATIVE
Nitrite, UA: NEGATIVE
PH UA: 6 (ref 5.0–8.0)
PROTEIN UA: NEGATIVE
RBC UA: NEGATIVE
Spec Grav, UA: 1.03 (ref 1.030–1.035)
Urobilinogen, UA: 2 — AB (ref ?–2.0)

## 2017-03-24 LAB — TSH: TSH: 3.95 u[IU]/mL (ref 0.35–4.50)

## 2017-03-24 LAB — HEMOGLOBIN A1C: Hgb A1c MFr Bld: 5.9 % (ref 4.6–6.5)

## 2017-03-24 MED ORDER — NAPROXEN 500 MG PO TBEC: 500.0000 mg | DELAYED_RELEASE_TABLET | Freq: Two times a day (BID) | ORAL | 0 refills | Status: DC

## 2017-03-24 MED ORDER — KETOROLAC TROMETHAMINE 30 MG/ML IJ SOLN
30.0000 mg | Freq: Once | INTRAMUSCULAR | Status: AC
  Administered 2017-03-24: 30 mg via INTRAMUSCULAR

## 2017-03-24 MED ORDER — CYCLOBENZAPRINE HCL 5 MG PO TABS: 5.0000 mg | ORAL_TABLET | Freq: Three times a day (TID) | ORAL | 0 refills | Status: DC | PRN

## 2017-03-24 MED FILL — NAPROXEN DR 500 MG TABLET: 500 | 15 days supply | Qty: 30 | Fill #0

## 2017-03-24 MED FILL — CYCLOBENZAPRINE 5 MG TABLET: 5 | 10 days supply | Qty: 30 | Fill #0

## 2017-03-24 NOTE — Progress Notes (Signed)
Pre visit review using our clinic review tool, if applicable. No additional management support is needed unless otherwise documented below in the visit note. 

## 2017-03-24 NOTE — Progress Notes (Signed)
Musculoskeletal Exam  Patient: Zachary [name] DOB: Nov 19, 1951  DOS: 03/24/2017  SUBJECTIVE:  Chief Complaint:   Chief Complaint  Patient presents with  . Back Pain    lower-(L) side-x 1 week    Zachary [name] is a 66 y.o.  male for evaluation and treatment of his back pain.   Onset:  1 week ago. No injury or change in activity, sudden.  Location: lower left Character:  sharp  Progression of issue:  has  Worsened Palliation: None Provocation: Movement Associated symptoms: None Denies bowel/bladder incontinence or weakness, no fevers, urinary complaints Treatment: to date has been rest, ice, acetaminophen and heat.   Neurovascular symptoms: no  ROS: Gastrointestinal: no bowel incontinence Genitourinary: No bladder incontinence Musculoskeletal/Extremities: +back pain Neurologic: no numbness, tingling no weakness   Past Medical History:  Diagnosis Date  . Arthritis   . Articular cartilage disease    left shoulder  . Cancer ([place]) 2015   positive prostate cancer bx  . Diverticulitis   . Dyslipidemia (high LDL; low HDL) 08/15/2016  . GERD (gastroesophageal reflux disease)   . Hearing problem    hearing deficit  . Hemorrhoids   . Hypertension   . Hypothyroidism   . Sleep apnea    uses a cpap  . Wears glasses   . Wears hearing aid    both ears   Past Surgical History:  Procedure Laterality Date  . BACK SURGERY  1978   herniated disksurgery  . CARDIAC CATHETERIZATION  08/01/2010  . COLONOSCOPY    . EXAM UNDER ANESTHESIA WITH MANIPULATION OF SHOULDER Right 09/15/2014   Procedure: RIGHT SHOULDER MANIPULATION UNDER ANESTHESIA;  Surgeon: Ninetta Lights, MD;  Location: [facility];  Service: Orthopedics;  Laterality: Right;  . HEMORRHOID SURGERY  08/2006  . SHOULDER ARTHROSCOPY WITH DISTAL CLAVICLE RESECTION Left 07/07/2015   Procedure: SHOULDER ARTHROSCOPY WITH DISTAL CLAVICLE RESECTION;  Surgeon: Kathryne Hitch, MD;  Location: [facility];  Service: Orthopedics;  Laterality: Left;  . SHOULDER ARTHROSCOPY WITH ROTATOR CUFF REPAIR Left 12/07/2015   Procedure: LEFT SHOULDER ARTHROSCOPY DEBRIDEMENT, WITH ROTATOR CUFF REPAIR;  Surgeon: Ninetta Lights, MD;  Location: [facility];  Service: Orthopedics;  Laterality: Left;  . SHOULDER ARTHROSCOPY WITH ROTATOR CUFF REPAIR AND SUBACROMIAL DECOMPRESSION Left 07/07/2015   Procedure: LEFT SHOULDER SCOPE DEBRIDEMENT, SUBACROMIAL DECOMPRESSION, DISTAL CLAVICULECTOMY, ROTATOR CUFF REPAIR  ;  Surgeon: Kathryne Hitch, MD;  Location: [facility];  Service: Orthopedics;  Laterality: Left;  ANESTHESIA: GENERAL, PRE/POST OP SCALENE  . SHOULDER ARTHROSCOPY WITH SUBACROMIAL DECOMPRESSION, ROTATOR CUFF REPAIR AND BICEP TENDON REPAIR Right 03/10/2014   Procedure: RIGHT SHOULDER ARTHROSCOPY WITH SUBACROMIAL DECOMPRESSION, PARTIAL ACROMIOPLASTY WITH CORACOAROMIAL LABRUM DEBRIDEMENT RELEASE DISTAL CLAVICULECTOMY,  ROTATOR CUFF REPAIR AND EXTENSIVE DEBRIDEMENT;  Surgeon: Ninetta Lights, MD;  Location: [facility];  Service: Orthopedics;  Laterality: Right;  . TENDON REPAIR  June 06, 2011   right elbow, Dr. Percell Miller   Family History  Problem Relation Age of Onset  . Heart disease Father   . Breast cancer Mother   . Cancer Brother   . Alcohol abuse Other   . Arthritis Other   . Cancer Other     Breast, Prostate  . Coronary artery disease Other   . Irritable bowel syndrome Other   . Cystic fibrosis Other   . Colon cancer Neg Hx    Current Outpatient Prescriptions  Medication Sig Dispense Refill  . acetaminophen (TYLENOL) 500 MG tablet Take  500 mg by mouth every 6 (six) hours as needed.    Marland Kitchen aspirin EC 81 MG EC tablet Take 81 mg by mouth daily.      . Cyanocobalamin (VITAMIN B 12 PO) Take 500 mg by mouth daily.    . hydrocortisone (ANUSOL-HC) 2.5 % rectal cream Place 1 application rectally at bedtime as needed for hemorrhoids or itching. 30 g 6  .  levothyroxine (SYNTHROID, LEVOTHROID) 100 MCG tablet TAKE 1 TABLET (100 MCG TOTAL) BY MOUTH DAILY. 90 tablet 1  . lidocaine (XYLOCAINE) 2 % solution Use as directed 10 mLs in the mouth or throat every 6 (six) hours as needed for mouth pain. 100 mL 0  . lisinopril (PRINIVIL,ZESTRIL) 5 MG tablet Take 1 tablet (5 mg total) by mouth daily. 90 tablet 1  . loratadine (CLARITIN) 10 MG tablet Take 10 mg by mouth daily.      . Magnesium 250 MG TABS Take 1 tablet by mouth daily.    . Melatonin 5 MG CAPS Take 2 capsules by mouth daily.    . Multiple Vitamins-Minerals (CENTRUM SILVER PO) Take 1 tablet by mouth daily.    Marland Kitchen omeprazole (PRILOSEC) 40 MG capsule TAKE 1 CAPSULE (40 MG TOTAL) BY MOUTH DAILY. 30 capsule 5  . pravastatin (PRAVACHOL) 40 MG tablet Take 0.5 tablets (20 mg total) by mouth daily. 45 tablet 2  . sodium bicarbonate 650 MG tablet Take 1 tablet (650 mg total) by mouth daily. 90 tablet 1  . fexofenadine (ALLEGRA) 180 MG tablet Take 1 tablet (180 mg total) by mouth daily. 20 tablet 0  . ranitidine (ZANTAC) 150 MG tablet Take 1 tablet (150 mg total) by mouth 2 (two) times daily. 14 tablet 0   Allergies  Allergen Reactions  . Oxycodone Itching  . Simvastatin Other (See Comments)    REACTION: muscle pain   Social History   Social History  . Marital status: Married    Spouse name: Neoma Laming  . Number of children: 2   Occupational History  . [employer]   Social History Main Topics  . Smoking status: Never Smoker  . Smokeless tobacco: Never Used  . Alcohol use No  . Drug use: No   Social History Narrative   Previously Chartered loss adjuster [employer]    Objective:  VITAL SIGNS: BP 128/80 (BP Location: Left Arm, Patient Position: Sitting, Cuff Size: Normal)   Pulse (!) 101   Temp 97.8 F (36.6 C) (Oral)   Ht 5\' 8"  (1.727 m)   Wt 206 lb (93.4 kg)   SpO2 97%   BMI 31.32 kg/m  Constitutional: Well formed, well  developed. No acute distress. Thorax & Lungs:  No accessory muscle use Extremities: No clubbing. No cyanosis. No edema.  Skin: Warm. Dry. No erythema. No rash.  Musculoskeletal: low back.   Poor ROM of hamstrings b/l Tenderness to palpation: yes Deformity: no Ecchymosis: no Straight leg test: negative for  5/5 strength throughout LE's b/l Neurologic: Normal sensory function. No focal deficits noted. DTR's equal and symmetry in LE's. No clonus. Psychiatric: Normal mood. Age appropriate judgment and insight. Alert & oriented x 3.    Assessment:  Left-sided low back pain without sciatica, unspecified chronicity - Plan: POCT Urinalysis Dipstick (Automated), cyclobenzaprine (FLEXERIL) 5 MG tablet, naproxen (EC NAPROSYN) 500 MG EC tablet  Prediabetes - Plan: Hemoglobin A1c  Hypothyroidism, unspecified type - Plan: TSH  Plan: Orders as above. Heat, OK to  stop icing if no longer helpful. NSAIDs and Tylenol. Home stretches and exercises given.  Pt has been on muscle relaxants in past, states it does not make him drowsy.  Will catch up on labs. F/u prn. The patient voiced understanding and agreement to the plan.   [place], DO 03/24/17  7:37 AM

## 2017-03-24 NOTE — Patient Instructions (Signed)
Your urine looks normal.   If you start having numbness, tingling, loss of control of your stool/urine, weakness, fevers, or other urinary complaints, seek immediate care.  EXERCISES  RANGE OF MOTION (ROM) AND STRETCHING EXERCISES - Low Back Pain Most people with lower back pain will find that their symptoms get worse with excessive bending forward (flexion) or arching at the lower back (extension). The exercises that will help resolve your symptoms will focus on the opposite motion.  Your physician, physical therapist or athletic trainer will help you determine which exercises will be most helpful to resolve your lower back pain. Do not complete any exercises without first consulting with your caregiver. Discontinue any exercises which make your symptoms worse, until you speak to your caregiver. If you have pain, numbness or tingling which travels down into your buttocks, leg or foot, the goal of the therapy is for these symptoms to move closer to your back and eventually resolve. Sometimes, these leg symptoms will get better, but your lower back pain may worsen. This is often an indication of progress in your rehabilitation. Be very alert to any changes in your symptoms and the activities in which you participated in the 24 hours prior to the change. Sharing this information with your caregiver will allow him or her to most efficiently treat your condition. These exercises may help you when beginning to rehabilitate your injury. Your symptoms may resolve with or without further involvement from your physician, physical therapist or athletic trainer. While completing these exercises, remember:   Restoring tissue flexibility helps normal motion to return to the joints. This allows healthier, less painful movement and activity.  An effective stretch should be held for at least 30 seconds.  A stretch should never be painful. You should only feel a gentle lengthening or release in the stretched  tissue. FLEXION RANGE OF MOTION AND STRETCHING EXERCISES:  STRETCH - Flexion, Single Knee to Chest   Lie on a firm bed or floor with both legs extended in front of you.  Keeping one leg in contact with the floor, bring your opposite knee to your chest. Hold your leg in place by either grabbing behind your thigh or at your knee.  Pull until you feel a gentle stretch in your low back. Hold 15-20 seconds.  Slowly release your grasp and repeat the exercise with the opposite side. Repeat 2 times. Complete this exercise 1-2 times per day.   STRETCH - Flexion, Double Knee to Chest  Lie on a firm bed or floor with both legs extended in front of you.  Keeping one leg in contact with the floor, bring your opposite knee to your chest.  Tense your stomach muscles to support your back and then lift your other knee to your chest. Hold your legs in place by either grabbing behind your thighs or at your knees.  Pull both knees toward your chest until you feel a gentle stretch in your low back. Hold 15-20 seconds.  Tense your stomach muscles and slowly return one leg at a time to the floor. Repeat 2 times. Complete this exercise 1-2 times per day.   STRETCH - Low Trunk Rotation  Lie on a firm bed or floor. Keeping your legs in front of you, bend your knees so they are both pointed toward the ceiling and your feet are flat on the floor.  Extend your arms out to the side. This will stabilize your upper body by keeping your shoulders in contact with the floor.  Gently and slowly drop both knees together to one side until you feel a gentle stretch in your low back. Hold for 15-20 seconds.  Tense your stomach muscles to support your lower back as you bring your knees back to the starting position. Repeat the exercise to the other side. Repeat 2 times. Complete this exercise 1-2 times per day  EXTENSION RANGE OF MOTION AND FLEXIBILITY EXERCISES:  STRETCH - Extension, Prone on Elbows   Lie on your  stomach on the floor, a bed will be too soft. Place your palms about shoulder width apart and at the height of your head.  Place your elbows under your shoulders. If this is too painful, stack pillows under your chest.  Allow your body to relax so that your hips drop lower and make contact more completely with the floor.  Hold this position for 15-20 seconds.  Slowly return to lying flat on the floor. Repeat 2 times. Complete this exercise 1-2 times per day.   RANGE OF MOTION - Extension, Prone Press Ups  Lie on your stomach on the floor, a bed will be too soft. Place your palms about shoulder width apart and at the height of your head.  Keeping your back as relaxed as possible, slowly straighten your elbows while keeping your hips on the floor. You may adjust the placement of your hands to maximize your comfort. As you gain motion, your hands will come more underneath your shoulders.  Hold this position 15-20 seconds.  Slowly return to lying flat on the floor. Repeat 2 times. Complete this exercise 1-2 times per day.   RANGE OF MOTION- Quadruped, Neutral Spine   Assume a hands and knees position on a firm surface. Keep your hands under your shoulders and your knees under your hips. You may place padding under your knees for comfort.  Drop your head and point your tailbone toward the ground below you. This will round out your lower back like an angry cat. Hold this position for 15-20 seconds.  Slowly lift your head and release your tail bone so that your back sags into a large arch, like an old horse.  Hold this position for 15-20 seconds.  Repeat this until you feel limber in your low back.  Now, find your "sweet spot." This will be the most comfortable position somewhere between the two previous positions. This is your neutral spine. Once you have found this position, tense your stomach muscles to support your low back.  Hold this position for 15-20 seconds. Repeat 2 times.  Complete this exercise 1-2 times per day.  STRENGTHENING EXERCISES - Low Back Sprain These exercises may help you when beginning to rehabilitate your injury. These exercises should be done near your "sweet spot." This is the neutral, low-back arch, somewhere between fully rounded and fully arched, that is your least painful position. When performed in this safe range of motion, these exercises can be used for people who have either a flexion or extension based injury. These exercises may resolve your symptoms with or without further involvement from your physician, physical therapist or athletic trainer. While completing these exercises, remember:   Muscles can gain both the endurance and the strength needed for everyday activities through controlled exercises.  Complete these exercises as instructed by your physician, physical therapist or athletic trainer. Increase the resistance and repetitions only as guided.  You may experience muscle soreness or fatigue, but the pain or discomfort you are trying to eliminate should never worsen during  these exercises. If this pain does worsen, stop and make certain you are following the directions exactly. If the pain is still present after adjustments, discontinue the exercise until you can discuss the trouble with your caregiver.  STRENGTHENING - Deep Abdominals, Pelvic Tilt   Lie on a firm bed or floor. Keeping your legs in front of you, bend your knees so they are both pointed toward the ceiling and your feet are flat on the floor.  Tense your lower abdominal muscles to press your low back into the floor. This motion will rotate your pelvis so that your tail bone is scooping upwards rather than pointing at your feet or into the floor. With a gentle tension and even breathing, hold this position for 10-15 seconds. Repeat 2 times. Complete this exercise 1 time per day.   STRENGTHENING - Abdominals, Crunches   Lie on a firm bed or floor. Keeping your legs  in front of you, bend your knees so they are both pointed toward the ceiling and your feet are flat on the floor. Cross your arms over your chest.  Slightly tip your chin down without bending your neck.  Tense your abdominals and slowly lift your trunk high enough to just clear your shoulder blades. Lifting higher can put excessive stress on the lower back and does not further strengthen your abdominal muscles.  Control your return to the starting position. Repeat 2 times. Complete this exercise once every 1-2 days.   STRENGTHENING - Quadruped, Opposite UE/LE Lift   Assume a hands and knees position on a firm surface. Keep your hands under your shoulders and your knees under your hips. You may place padding under your knees for comfort.  Find your neutral spine and gently tense your abdominal muscles so that you can maintain this position. Your shoulders and hips should form a rectangle that is parallel with the floor and is not twisted.  Keeping your trunk steady, lift your right hand no higher than your shoulder and then your left leg no higher than your hip. Make sure you are not holding your breath. Hold this position for 15-20 seconds.  Continuing to keep your abdominal muscles tense and your back steady, slowly return to your starting position. Repeat with the opposite arm and leg. Repeat 2 times. Complete this exercise once every 1-2 days.   STRENGTHENING - Abdominals and Quadriceps, Straight Leg Raise   Lie on a firm bed or floor with both legs extended in front of you.  Keeping one leg in contact with the floor, bend the other knee so that your foot can rest flat on the floor.  Find your neutral spine, and tense your abdominal muscles to maintain your spinal position throughout the exercise.  Slowly lift your straight leg off the floor about 6 inches for a count of 15, making sure to not hold your breath.  Still keeping your neutral spine, slowly lower your leg all the way to  the floor. Repeat this exercise with each leg 2 times. Complete this exercise once every 1-2 days. POSTURE AND BODY MECHANICS CONSIDERATIONS - Low Back Sprain Keeping correct posture when sitting, standing or completing your activities will reduce the stress put on different body tissues, allowing injured tissues a chance to heal and limiting painful experiences. The following are general guidelines for improved posture. Your physician or physical therapist will provide you with any instructions specific to your needs. While reading these guidelines, remember:  The exercises prescribed by your provider will  help you have the flexibility and strength to maintain correct postures.  The correct posture provides the best environment for your joints to work. All of your joints have less wear and tear when properly supported by a spine with good posture. This means you will experience a healthier, less painful body.  Correct posture must be practiced with all of your activities, especially prolonged sitting and standing. Correct posture is as important when doing repetitive low-stress activities (typing) as it is when doing a single heavy-load activity (lifting).  RESTING POSITIONS Consider which positions are most painful for you when choosing a resting position. If you have pain with flexion-based activities (sitting, bending, stooping, squatting), choose a position that allows you to rest in a less flexed posture. You would want to avoid curling into a fetal position on your side. If your pain worsens with extension-based activities (prolonged standing, working overhead), avoid resting in an extended position such as sleeping on your stomach. Most people will find more comfort when they rest with their spine in a more neutral position, neither too rounded nor too arched. Lying on a non-sagging bed on your side with a pillow between your knees, or on your back with a pillow under your knees will often  provide some relief. Keep in mind, being in any one position for a prolonged period of time, no matter how correct your posture, can still lead to stiffness. PROPER SITTING POSTURE In order to minimize stress and discomfort on your spine, you must sit with correct posture. Sitting with good posture should be effortless for a healthy body. Returning to good posture is a gradual process. Many people can work toward this most comfortably by using various supports until they have the flexibility and strength to maintain this posture on their own. When sitting with proper posture, your ears will fall over your shoulders and your shoulders will fall over your hips. You should use the back of the chair to support your upper back. Your lower back will be in a neutral position, just slightly arched. You may place a small pillow or folded towel at the base of your lower back for  support.  When working at a desk, create an environment that supports good, upright posture. Without extra support, muscles tire, which leads to excessive strain on joints and other tissues. Keep these recommendations in mind:  CHAIR:  A chair should be able to slide under your desk when your back makes contact with the back of the chair. This allows you to work closely.  The chair's height should allow your eyes to be level with the upper part of your monitor and your hands to be slightly lower than your elbows.  BODY POSITION  Your feet should make contact with the floor. If this is not possible, use a foot rest.  Keep your ears over your shoulders. This will reduce stress on your neck and low back.  INCORRECT SITTING POSTURES  If you are feeling tired and unable to assume a healthy sitting posture, do not slouch or slump. This puts excessive strain on your back tissues, causing more damage and pain. Healthier options include:  Using more support, like a lumbar pillow.  Switching tasks to something that requires you to be  upright or walking.  Talking a brief walk.  Lying down to rest in a neutral-spine position.  PROLONGED STANDING WHILE SLIGHTLY LEANING FORWARD  When completing a task that requires you to lean forward while standing in one place for  a long time, place either foot up on a stationary 2-4 inch high object to help maintain the best posture. When both feet are on the ground, the lower back tends to lose its slight inward curve. If this curve flattens (or becomes too large), then the back and your other joints will experience too much stress, tire more quickly, and can cause pain.  CORRECT STANDING POSTURES Proper standing posture should be assumed with all daily activities, even if they only take a few moments, like when brushing your teeth. As in sitting, your ears should fall over your shoulders and your shoulders should fall over your hips. You should keep a slight tension in your abdominal muscles to brace your spine. Your tailbone should point down to the ground, not behind your body, resulting in an over-extended swayback posture.   INCORRECT STANDING POSTURES  Common incorrect standing postures include a forward head, locked knees and/or an excessive swayback. WALKING Walk with an upright posture. Your ears, shoulders and hips should all line-up.  PROLONGED ACTIVITY IN A FLEXED POSITION When completing a task that requires you to bend forward at your waist or lean over a low surface, try to find a way to stabilize 3 out of 4 of your limbs. You can place a hand or elbow on your thigh or rest a knee on the surface you are reaching across. This will provide you more stability, so that your muscles do not tire as quickly. By keeping your knees relaxed, or slightly bent, you will also reduce stress across your lower back. CORRECT LIFTING TECHNIQUES  DO :  Assume a wide stance. This will provide you more stability and the opportunity to get as close as possible to the object which you are  lifting.  Tense your abdominals to brace your spine. Bend at the knees and hips. Keeping your back locked in a neutral-spine position, lift using your leg muscles. Lift with your legs, keeping your back straight.  Test the weight of unknown objects before attempting to lift them.  Try to keep your elbows locked down at your sides in order get the best strength from your shoulders when carrying an object.     Always ask for help when lifting heavy or awkward objects. INCORRECT LIFTING TECHNIQUES DO NOT:   Lock your knees when lifting, even if it is a small object.  Bend and twist. Pivot at your feet or move your feet when needing to change directions.  Assume that you can safely pick up even a paperclip without proper posture.

## 2017-03-24 NOTE — Addendum Note (Signed)
Addended by: Harl Bowie on: 03/24/2017 07:57 AM   Modules accepted: Orders

## 2017-03-25 ENCOUNTER — Ambulatory Visit: Payer: 59 | Attending: Sports Medicine | Admitting: Physical Therapy

## 2017-03-25 DIAGNOSIS — R293 Abnormal posture: Secondary | ICD-10-CM | POA: Diagnosis not present

## 2017-03-25 DIAGNOSIS — M25611 Stiffness of right shoulder, not elsewhere classified: Secondary | ICD-10-CM | POA: Insufficient documentation

## 2017-03-25 DIAGNOSIS — M6281 Muscle weakness (generalized): Secondary | ICD-10-CM | POA: Insufficient documentation

## 2017-03-25 DIAGNOSIS — M25511 Pain in right shoulder: Secondary | ICD-10-CM | POA: Diagnosis not present

## 2017-03-25 NOTE — Therapy (Signed)
[facility] [address]  [place], [place], [address] Phone: [phone]   Fax:  [phone]  Physical Therapy Treatment  Patient Details  Name: Zachary [name] MRN: [id number] Date of Birth: 03-23-1951 Referring Provider: Dr. Corky Mull  Encounter Date: 03/25/2017      PT End of Session - 03/25/17 0758    Visit Number 23   Number of Visits 38   Date for PT Re-Evaluation 05/09/17   PT Start Time 0758   PT Stop Time 0843   PT Time Calculation (min) 45 min   Activity Tolerance Patient tolerated treatment well   Behavior During Therapy [facility] for tasks assessed/performed      Past Medical History:  Diagnosis Date  . Arthritis   . Articular cartilage disease    left shoulder  . Cancer ([place]) 2015   positive prostate cancer bx  . Diverticulitis   . Dyslipidemia (high LDL; low HDL) 08/15/2016  . GERD (gastroesophageal reflux disease)   . Hearing problem    hearing deficit  . Hemorrhoids   . Hypertension   . Hypothyroidism   . Sleep apnea    uses a cpap  . Wears glasses   . Wears hearing aid    both ears    Past Surgical History:  Procedure Laterality Date  . BACK SURGERY  1978   herniated disksurgery  . CARDIAC CATHETERIZATION  08/01/2010  . COLONOSCOPY    . EXAM UNDER ANESTHESIA WITH MANIPULATION OF SHOULDER Right 09/15/2014   Procedure: RIGHT SHOULDER MANIPULATION UNDER ANESTHESIA;  Surgeon: Ninetta Lights, MD;  Location: [place];  Service: Orthopedics;  Laterality: Right;  . HEMORRHOID SURGERY  08/2006  . SHOULDER ARTHROSCOPY WITH DISTAL CLAVICLE RESECTION Left 07/07/2015   Procedure: SHOULDER ARTHROSCOPY WITH DISTAL CLAVICLE RESECTION;  Surgeon: Kathryne Hitch, MD;  Location: [place];  Service: Orthopedics;  Laterality: Left;  . SHOULDER ARTHROSCOPY WITH ROTATOR CUFF REPAIR Left 12/07/2015   Procedure: LEFT SHOULDER ARTHROSCOPY DEBRIDEMENT, WITH ROTATOR CUFF  REPAIR;  Surgeon: Ninetta Lights, MD;  Location: [place];  Service: Orthopedics;  Laterality: Left;  . SHOULDER ARTHROSCOPY WITH ROTATOR CUFF REPAIR AND SUBACROMIAL DECOMPRESSION Left 07/07/2015   Procedure: LEFT SHOULDER SCOPE DEBRIDEMENT, SUBACROMIAL DECOMPRESSION, DISTAL CLAVICULECTOMY, ROTATOR CUFF REPAIR  ;  Surgeon: Kathryne Hitch, MD;  Location: [place];  Service: Orthopedics;  Laterality: Left;  ANESTHESIA: GENERAL, PRE/POST OP SCALENE  . SHOULDER ARTHROSCOPY WITH SUBACROMIAL DECOMPRESSION, ROTATOR CUFF REPAIR AND BICEP TENDON REPAIR Right 03/10/2014   Procedure: RIGHT SHOULDER ARTHROSCOPY WITH SUBACROMIAL DECOMPRESSION, PARTIAL ACROMIOPLASTY WITH CORACOAROMIAL LABRUM DEBRIDEMENT RELEASE DISTAL CLAVICULECTOMY,  ROTATOR CUFF REPAIR AND EXTENSIVE DEBRIDEMENT;  Surgeon: Ninetta Lights, MD;  Location: [place];  Service: Orthopedics;  Laterality: Right;  . TENDON REPAIR  June 06, 2011   right elbow, Dr. Percell Miller    There were no vitals filed for this visit.      Subjective Assessment - 03/25/17 0802    Subjective Pt saw MD for his back yesterday and has been started on muscle relaxant and pain meds. Noting gradual improvement.   Patient Stated Goals "get back to work"    Currently in Pain? Yes   Pain Score 0-No pain   Pain Location Shoulder   Pain Orientation Right   Pain Score 3   Pain Location Back   Pain Orientation Left;Lower   Pain Type Acute pain   Pain Onset 1 to  4 weeks ago   Pain Frequency Constant            [facility] PT Assessment - 03/25/17 0758      AROM   Right Shoulder Flexion 157 Degrees   Right Shoulder ABduction 149 Degrees   Right Shoulder Internal Rotation 88 Degrees   Right Shoulder External Rotation 91 Degrees     PROM   Right Shoulder Flexion 174 Degrees   Right Shoulder ABduction 174 Degrees                     [facility] Adult PT Treatment/Exercise - 03/25/17 0758      Shoulder Exercises:  Prone   Extension Both;15 reps;Weights  3" hold   Extension Weight (lbs) 2   Extension Limitations "I" - prone over green Pball   External Rotation Both;15 reps;Weights  3" hold   External Rotation Weight (lbs) 1   External Rotation Limitations "W" - prone over green Pball   Horizontal ABduction 1 Both;15 reps;Weights  3" hold   Horizontal ABduction 1 Weight (lbs) 2   Horizontal ABduction 1 Limitations "T" - prone over green Pball   Horizontal ABduction 2 Both;15 reps;Weights  3" hold   Horizontal ABduction 2 Weight (lbs) 2   Horizontal ABduction 2 Limitations "Y" - prone over green Pball     Shoulder Exercises: Standing   Flexion Both;15 reps;Weights   Shoulder Flexion Weight (lbs) 3   Flexion Limitations standing against 6" FR on wall   ABduction Both;15 reps;Weights   Shoulder ABduction Weight (lbs) 2   ABduction Limitations slight scaption; standing against 6" FR on wall   Other Standing Exercises Placing weights to cabinet 1st & 2nd shelf 3# x15     Shoulder Exercises: Pulleys   Flexion 3 minutes   ABduction 3 minutes   ABduction Limitations in scapular plane     Shoulder Exercises: ROM/Strengthening   UBE (Upper Arm Bike) lvl 3.5 fwd/back 3' each   Wall Pushups 15 reps   Pushups Limitations on green (65 cm) Pball on wall   Ball on Wall Shoulder circles with small ball above shoulder height flexion, ABD in scapular plan CW/CCW 2# cuff wt x15 each     Shoulder Exercises: Body Blade   Flexion 30 seconds;2 reps   Flexion Limitations 90 dg flexion   Other Body Blade Exercises Neutral shoulder with elbow flexed to 90 dg 2x30"                  PT Short Term Goals - 01/23/17 0951      PT SHORT TERM GOAL #1   Title R shoulder PROM forward flexion to 140, ER at side to 40, & ABD without rotation to 80 by 01/24/17    Status Achieved     PT SHORT TERM GOAL #2   Title independent with basic HEP by 01/17/17   Status Achieved           PT Long Term Goals -  03/25/17 0808      PT LONG TERM GOAL #1   Title R shoulder AROM WFL by 05/09/17   Status On-going  met for gravity minimized AROM; nearly met for AROM against gravity     PT LONG TERM GOAL #2   Title pt is able to return to all ADLs, chores, and work duties without limitation by shoulder pain, LOM, or weakness by 05/09/17.   Status On-going     PT LONG TERM GOAL #3  Title pt able to sleep without limitation by shoulder pain by 05/09/17   Status On-going     PT LONG TERM GOAL #4   Title R shoulder MMT >/= to 4/5 by 05/09/17   Status On-going               Plan - 03/25/17 0804    Clinical Impression Statement Pt demonstrating full IR/ER A/PROM and flexion/abduction PROM with flexion & abduction AROM continuing to improve. Continued R shoulder elevation noted with fatigue but otherwise pt demonstrating improving glenohumeral mechanics. Progressed proprioceptive training with addition of Pball for wall push-up and intiation of body blade with some fatigue noted but no pain.   Rehab Potential Good   Clinical Impairments Affecting Rehab Potential history of multiple bilateral RTC surgeries; history of R elbow surgery   PT Treatment/Interventions Patient/family education;ADLs/Self Care Home Management;Passive range of motion;Manual techniques;Therapeutic exercise;Therapeutic activities;Cryotherapy;Vasopneumatic Device;Moist Heat;Electrical Stimulation;Scar mobilization;Taping;Iontophoresis 50m/ml Dexamethasone   PT Next Visit Plan Gradually progress scapular stabilization & RTC strengthening per tolerance & protocol; Manual therapy for STM/MFR, PROM & joint mobs as indicated for pain and ROM; Modalities PRN   Consulted and Agree with Plan of Care Patient      Patient will benefit from skilled therapeutic intervention in order to improve the following deficits and impairments:  Decreased range of motion, Decreased strength, Pain, Impaired UE functional use, Decreased activity tolerance,  Decreased scar mobility, Postural dysfunction  Visit Diagnosis: Right shoulder pain, unspecified chronicity  Stiffness of right shoulder, not elsewhere classified  Abnormal posture  Muscle weakness (generalized)     Problem List Patient Active Problem List   Diagnosis Date Noted  . Dyslipidemia (high LDL; low HDL) 08/15/2016  . Complete rotator cuff tear of left shoulder 07/05/2015  . Tennis elbow 05/30/2014  . Rotator cuff (capsule) sprain 03/10/2014  . Right shoulder pain 02/11/2014  . Memory difficulty 12/18/2012  . Gynecomastia, male 07/30/2012  . OSA (obstructive sleep apnea) 04/23/2011  . EXTERNAL HEMORRHOIDS 02/12/2011  . [place]/12[place]  . PLANTAR FASCIITIS 04/09/2010  . Hypothyroidism 12/08/2009  . NEOPLASM OF UNCERTAIN BEHAVIOR OF SKIN 03/10/2009  . OSTEOARTHRITIS 02/14/2009  . HEARING DEFICIT 02/08/2009  . Essential hypertension 02/08/2009  . ALLERGIC RHINITIS 02/08/2009  . GERD 02/08/2009  . PROTEINURIA 02/08/2009  . DIVERTICULITIS, HX OF 02/08/2009    JPercival Spanish PT, MPT 03/25/2017, 8:44 AM  [facility] [place] [address]Phone: [phone]  Fax:  [phone] Name: Zachary BRANDENBERGERMRN: [id number] of Birth: [birth year]

## 2017-03-27 ENCOUNTER — Ambulatory Visit: Payer: 59

## 2017-03-27 DIAGNOSIS — M25611 Stiffness of right shoulder, not elsewhere classified: Secondary | ICD-10-CM

## 2017-03-27 DIAGNOSIS — M25511 Pain in right shoulder: Secondary | ICD-10-CM

## 2017-03-27 DIAGNOSIS — M6281 Muscle weakness (generalized): Secondary | ICD-10-CM

## 2017-03-27 DIAGNOSIS — R293 Abnormal posture: Secondary | ICD-10-CM | POA: Diagnosis not present

## 2017-03-27 NOTE — Therapy (Signed)
[facility] [address]  [place], [place], [address] Phone: [phone]   Fax:  [phone]  Physical Therapy Treatment  Patient Details  Name: Zachary [name] MRN: [id number] Date of Birth: 1951-04-11 Referring Provider: Dr. Corky Mull  Encounter Date: 03/27/2017      PT End of Session - 03/27/17 0807    Visit Number 24   Number of Visits 38   Date for PT Re-Evaluation 05/09/17   PT Start Time 0758   PT Stop Time 0841   PT Time Calculation (min) 43 min   Activity Tolerance Patient tolerated treatment well   Behavior During Therapy [facility] for tasks assessed/performed      Past Medical History:  Diagnosis Date  . Arthritis   . Articular cartilage disease    left shoulder  . Cancer ([place]) 2015   positive prostate cancer bx  . Diverticulitis   . Dyslipidemia (high LDL; low HDL) 08/15/2016  . GERD (gastroesophageal reflux disease)   . Hearing problem    hearing deficit  . Hemorrhoids   . Hypertension   . Hypothyroidism   . Sleep apnea    uses a cpap  . Wears glasses   . Wears hearing aid    both ears    Past Surgical History:  Procedure Laterality Date  . BACK SURGERY  1978   herniated disksurgery  . CARDIAC CATHETERIZATION  08/01/2010  . COLONOSCOPY    . EXAM UNDER ANESTHESIA WITH MANIPULATION OF SHOULDER Right 09/15/2014   Procedure: RIGHT SHOULDER MANIPULATION UNDER ANESTHESIA;  Surgeon: Ninetta Lights, MD;  Location: [place];  Service: Orthopedics;  Laterality: Right;  . HEMORRHOID SURGERY  08/2006  . SHOULDER ARTHROSCOPY WITH DISTAL CLAVICLE RESECTION Left 07/07/2015   Procedure: SHOULDER ARTHROSCOPY WITH DISTAL CLAVICLE RESECTION;  Surgeon: Kathryne Hitch, MD;  Location: [place];  Service: Orthopedics;  Laterality: Left;  . SHOULDER ARTHROSCOPY WITH ROTATOR CUFF REPAIR Left 12/07/2015   Procedure: LEFT SHOULDER ARTHROSCOPY DEBRIDEMENT, WITH ROTATOR CUFF  REPAIR;  Surgeon: Ninetta Lights, MD;  Location: [place];  Service: Orthopedics;  Laterality: Left;  . SHOULDER ARTHROSCOPY WITH ROTATOR CUFF REPAIR AND SUBACROMIAL DECOMPRESSION Left 07/07/2015   Procedure: LEFT SHOULDER SCOPE DEBRIDEMENT, SUBACROMIAL DECOMPRESSION, DISTAL CLAVICULECTOMY, ROTATOR CUFF REPAIR  ;  Surgeon: Kathryne Hitch, MD;  Location: [place];  Service: Orthopedics;  Laterality: Left;  ANESTHESIA: GENERAL, PRE/POST OP SCALENE  . SHOULDER ARTHROSCOPY WITH SUBACROMIAL DECOMPRESSION, ROTATOR CUFF REPAIR AND BICEP TENDON REPAIR Right 03/10/2014   Procedure: RIGHT SHOULDER ARTHROSCOPY WITH SUBACROMIAL DECOMPRESSION, PARTIAL ACROMIOPLASTY WITH CORACOAROMIAL LABRUM DEBRIDEMENT RELEASE DISTAL CLAVICULECTOMY,  ROTATOR CUFF REPAIR AND EXTENSIVE DEBRIDEMENT;  Surgeon: Ninetta Lights, MD;  Location: [place];  Service: Orthopedics;  Laterality: Right;  . TENDON REPAIR  June 06, 2011   right elbow, Dr. Percell Miller    There were no vitals filed for this visit.      Subjective Assessment - 03/27/17 0800    Subjective Pt. reporting he feels the R shoulder is responding well and overhead motion is getting easier.  Still most, "trouble" reaching outward.     Patient Stated Goals "get back to work"    Currently in Pain? Yes   Pain Score 2    Pain Location Shoulder   Pain Orientation Right   Pain Descriptors / Indicators Sharp   Pain Type Surgical pain   Pain Onset More than a month ago  Pain Frequency Intermittent   Aggravating Factors  weather   Pain Relieving Factors heat    Multiple Pain Sites Yes   Pain Score 5   Pain Location Back   Pain Orientation Left;Lower   Pain Descriptors / Indicators Sharp   Pain Type Acute pain   Pain Onset 1 to 4 weeks ago   Pain Frequency Intermittent            [facility] PT Assessment - 03/27/17 0808      Assessment   Next MD Visit 05/16/17  rescheduled from 05/08/17                      [facility] Adult PT Treatment/Exercise - 03/27/17 0808      Exercises   Exercises Shoulder     Shoulder Exercises: Prone   Extension Both;Weights;10 reps   Extension Weight (lbs) 3   Extension Limitations "I" - prone over green Pball   External Rotation Both;15 reps;Weights  3" hold    External Rotation Weight (lbs) 1   External Rotation Limitations "W" - prone over green Pball   Horizontal ABduction 1 Both;Weights;10 reps   Horizontal ABduction 1 Weight (lbs) 3   Horizontal ABduction 1 Limitations "T" - prone over green Pball   Horizontal ABduction 2 Both;15 reps;Weights   Horizontal ABduction 2 Weight (lbs) 2   Horizontal ABduction 2 Limitations "Y" - prone over green Pball     Shoulder Exercises: Standing   Flexion Both;15 reps;Weights   Shoulder Flexion Weight (lbs) 3   Flexion Limitations standing against 6" FR on wall   ABduction Both;15 reps;Weights   Shoulder ABduction Weight (lbs) 3   ABduction Limitations slight scaption; standing against 6" FR on wall     Shoulder Exercises: Pulleys   Flexion 3 minutes   ABduction 3 minutes   ABduction Limitations in scapular plane     Shoulder Exercises: Therapy Ball   Flexion 15 reps   Flexion Limitations 3 # cuff wt on R; standing roll-up with orange (55 cm) Pball on wall with stretch at end ROM   ABduction 15 reps   ABduction Limitations 3# cuff wt on R; standing roll-up with orange (55 cm) Pball on wall with stretch at end ROM     Shoulder Exercises: ROM/Strengthening   UBE (Upper Arm Bike) lvl 3.5 fwd/back 3' each   Cybex Row 15 reps  5" hold    Cybex Row Limitations cues for scap. squeeze    Wall Pushups 20 reps   Pushups Limitations on green (75 cm) Pball on wall   Other ROM/Strengthening Exercises Pulldown 20# x 15 reps; gentle stretch at top     Shoulder Exercises: Body Blade   Flexion 30 seconds;2 reps   Flexion Limitations 0-90 dg arc   B    Other Body Blade Exercises Neutral shoulder with elbow flexed to 90  dg 2x30"  IR/ER                   PT Short Term Goals - 01/23/17 0951      PT SHORT TERM GOAL #1   Title R shoulder PROM forward flexion to 140, ER at side to 40, & ABD without rotation to 80 by 01/24/17    Status Achieved     PT SHORT TERM GOAL #2   Title independent with basic HEP by 01/17/17   Status Achieved           PT Long Term Goals - 03/25/17  0808      PT LONG TERM GOAL #1   Title R shoulder AROM WFL by 05/09/17   Status On-going  met for gravity minimized AROM; nearly met for AROM against gravity     PT LONG TERM GOAL #2   Title pt is able to return to all ADLs, chores, and work duties without limitation by shoulder pain, LOM, or weakness by 05/09/17.   Status On-going     PT LONG TERM GOAL #3   Title pt able to sleep without limitation by shoulder pain by 05/09/17   Status On-going     PT LONG TERM GOAL #4   Title R shoulder MMT >/= to 4/5 by 05/09/17   Status On-going               Plan - 03/27/17 1105    Clinical Impression Statement Pt. tolerating progression in reps with prone scapular strengthening, standing flexion, abduction well today.  Low resistance, machine row and lat pulldown added and tolerated well.  Body blade with mild progression today.  Fatigue following strengthening therex today however R shoulder pain remaining low.    Pt. continues to complain of back pain unrelated to R shoulder.  Pt. will continue to benefit from further skilled therapy to improve UE strength, proprioception, and maximize functional strength.     PT Treatment/Interventions Patient/family education;ADLs/Self Care Home Management;Passive range of motion;Manual techniques;Therapeutic exercise;Therapeutic activities;Cryotherapy;Vasopneumatic Device;Moist Heat;Electrical Stimulation;Scar mobilization;Taping;Iontophoresis 50m/ml Dexamethasone   PT Next Visit Plan Gradually progress scapular stabilization & RTC strengthening per tolerance & protocol; Manual therapy  for STM/MFR, PROM & joint mobs as indicated for pain and ROM; Modalities PRN      Patient will benefit from skilled therapeutic intervention in order to improve the following deficits and impairments:  Decreased range of motion, Decreased strength, Pain, Impaired UE functional use, Decreased activity tolerance, Decreased scar mobility, Postural dysfunction  Visit Diagnosis: Right shoulder pain, unspecified chronicity  Stiffness of right shoulder, not elsewhere classified  Abnormal posture  Muscle weakness (generalized)     Problem List Patient Active Problem List   Diagnosis Date Noted  . Dyslipidemia (high LDL; low HDL) 08/15/2016  . Complete rotator cuff tear of left shoulder 07/05/2015  . Tennis elbow 05/30/2014  . Rotator cuff (capsule) sprain 03/10/2014  . Right shoulder pain 02/11/2014  . Memory difficulty 12/18/2012  . Gynecomastia, male 07/30/2012  . OSA (obstructive sleep apnea) 04/23/2011  . EXTERNAL HEMORRHOIDS 02/12/2011  . [place]  . PLANTAR FASCIITIS 04/09/2010  . Hypothyroidism 12/08/2009  . NEOPLASM OF UNCERTAIN BEHAVIOR OF SKIN 03/10/2009  . OSTEOARTHRITIS 02/14/2009  . HEARING DEFICIT 02/08/2009  . Essential hypertension 02/08/2009  . ALLERGIC RHINITIS 02/08/2009  . GERD 02/08/2009  . PROTEINURIA 02/08/2009  . DIVERTICULITIS, HX OF 02/08/2009    MBess Harvest PTA 03/27/17 11:19 AM  [facility] [address] [place] [address]Phone: [phone]  Fax:  [phone] Name: FRAMESH MOANMRN: [id number] of Birth: [birth year]

## 2017-03-31 ENCOUNTER — Other Ambulatory Visit: Payer: Self-pay | Admitting: Family Medicine

## 2017-03-31 MED FILL — OMEPRAZOLE DR 40 MG CAPSULE: 40 | 30 days supply | Qty: 30 | Fill #1

## 2017-03-31 NOTE — Telephone Encounter (Signed)
Rx sent to the pharmacy by e-script.//AB/CMA 

## 2017-04-01 ENCOUNTER — Ambulatory Visit: Payer: 59

## 2017-04-01 DIAGNOSIS — M25611 Stiffness of right shoulder, not elsewhere classified: Secondary | ICD-10-CM | POA: Diagnosis not present

## 2017-04-01 DIAGNOSIS — M25511 Pain in right shoulder: Secondary | ICD-10-CM | POA: Diagnosis not present

## 2017-04-01 DIAGNOSIS — R293 Abnormal posture: Secondary | ICD-10-CM

## 2017-04-01 DIAGNOSIS — M6281 Muscle weakness (generalized): Secondary | ICD-10-CM

## 2017-04-01 MED FILL — LEVOTHYROXINE 100 MCG TAB: 100 | 90 days supply | Qty: 90 | Fill #0

## 2017-04-01 NOTE — Therapy (Signed)
[facility] [address]  [place], [place], [address] Phone: [phone]   Fax:  [phone]  Physical Therapy Treatment  Patient Details  Name: Zachary [name] MRN: [id number] Date of Birth: 1951/08/02 Referring Provider: Dr. Corky Mull  Encounter Date: 04/01/2017      PT End of Session - 04/01/17 0801    Visit Number 25   Number of Visits 38   Date for PT Re-Evaluation 05/09/17   PT Start Time 0758   PT Stop Time 0844   PT Time Calculation (min) 46 min   Activity Tolerance Patient tolerated treatment well   Behavior During Therapy [facility] for tasks assessed/performed      Past Medical History:  Diagnosis Date  . Arthritis   . Articular cartilage disease    left shoulder  . Cancer ([place]) 2015   positive prostate cancer bx  . Diverticulitis   . Dyslipidemia (high LDL; low HDL) 08/15/2016  . GERD (gastroesophageal reflux disease)   . Hearing problem    hearing deficit  . Hemorrhoids   . Hypertension   . Hypothyroidism   . Sleep apnea    uses a cpap  . Wears glasses   . Wears hearing aid    both ears    Past Surgical History:  Procedure Laterality Date  . BACK SURGERY  1978   herniated disksurgery  . CARDIAC CATHETERIZATION  08/01/2010  . COLONOSCOPY    . EXAM UNDER ANESTHESIA WITH MANIPULATION OF SHOULDER Right 09/15/2014   Procedure: RIGHT SHOULDER MANIPULATION UNDER ANESTHESIA;  Surgeon: Ninetta Lights, MD;  Location: [place];  Service: Orthopedics;  Laterality: Right;  . HEMORRHOID SURGERY  08/2006  . SHOULDER ARTHROSCOPY WITH DISTAL CLAVICLE RESECTION Left 07/07/2015   Procedure: SHOULDER ARTHROSCOPY WITH DISTAL CLAVICLE RESECTION;  Surgeon: Kathryne Hitch, MD;  Location: [place];  Service: Orthopedics;  Laterality: Left;  . SHOULDER ARTHROSCOPY WITH ROTATOR CUFF REPAIR Left 12/07/2015   Procedure: LEFT SHOULDER ARTHROSCOPY DEBRIDEMENT, WITH ROTATOR CUFF  REPAIR;  Surgeon: Ninetta Lights, MD;  Location: [place];  Service: Orthopedics;  Laterality: Left;  . SHOULDER ARTHROSCOPY WITH ROTATOR CUFF REPAIR AND SUBACROMIAL DECOMPRESSION Left 07/07/2015   Procedure: LEFT SHOULDER SCOPE DEBRIDEMENT, SUBACROMIAL DECOMPRESSION, DISTAL CLAVICULECTOMY, ROTATOR CUFF REPAIR  ;  Surgeon: Kathryne Hitch, MD;  Location: [place];  Service: Orthopedics;  Laterality: Left;  ANESTHESIA: GENERAL, PRE/POST OP SCALENE  . SHOULDER ARTHROSCOPY WITH SUBACROMIAL DECOMPRESSION, ROTATOR CUFF REPAIR AND BICEP TENDON REPAIR Right 03/10/2014   Procedure: RIGHT SHOULDER ARTHROSCOPY WITH SUBACROMIAL DECOMPRESSION, PARTIAL ACROMIOPLASTY WITH CORACOAROMIAL LABRUM DEBRIDEMENT RELEASE DISTAL CLAVICULECTOMY,  ROTATOR CUFF REPAIR AND EXTENSIVE DEBRIDEMENT;  Surgeon: Ninetta Lights, MD;  Location: [place];  Service: Orthopedics;  Laterality: Right;  . TENDON REPAIR  June 06, 2011   right elbow, Dr. Percell Miller    There were no vitals filed for this visit.      Subjective Assessment - 04/01/17 0757    Subjective Pt. reporting reaching activities are getting easier with less pain.     Patient Stated Goals "get back to work"    Currently in Pain? No/denies   Pain Score 0-No pain   Multiple Pain Sites No                         [facility] Adult PT Treatment/Exercise - 04/01/17 0805      Shoulder Exercises: Prone  Extension Both;Weights;15 reps   Extension Weight (lbs) 3   Extension Limitations "I" - prone over green Pball   External Rotation Both;Weights;10 reps   External Rotation Weight (lbs) 2   External Rotation Limitations "W" - prone over green Pball   Horizontal ABduction 1 Both;Weights;15 reps   Horizontal ABduction 1 Weight (lbs) 3   Horizontal ABduction 1 Limitations "T" - prone over green Pball   Horizontal ABduction 2 Both;15 reps;Weights   Horizontal ABduction 2 Weight (lbs) 2   Horizontal ABduction 2  Limitations "Y" - prone over green Pball     Shoulder Exercises: Standing   Flexion Both;15 reps;Weights   Shoulder Flexion Weight (lbs) 3   Flexion Limitations standing against 6" FR on wall   ABduction Both;Weights;20 reps   Shoulder ABduction Weight (lbs) 3   ABduction Limitations slight scaption; standing against 6" FR on wall     Shoulder Exercises: Pulleys   Flexion 3 minutes  with pool noodle behind back    ABduction 3 minutes  with pool noodle behind back    ABduction Limitations in scapular plane     Shoulder Exercises: ROM/Strengthening   UBE (Upper Arm Bike) lvl 4.0 fwd/back 2' each   Cybex Row 15 reps   Cybex Row Limitations 25#; 3" hold    Wall Pushups 10 reps   Pushups Limitations at counter ~ 60 dg    Other ROM/Strengthening Exercises Pulldown 25# x 15 reps; gentle stretch at top   Other ROM/Strengthening Exercises side <>on BOSU ball (on table) in partial pushup position ~ 60 dg x 15 reps     Shoulder Exercises: Body Blade   Flexion 30 seconds;2 reps  R single arm    Flexion Limitations 0-90 dg arc    ABduction 30 seconds;2 reps   ABduction Limitations Scaption 0-90 dg    Other Body Blade Exercises Neutral shoulder with elbow flexed to 90 dg 2x35"                  PT Short Term Goals - 01/23/17 0951      PT SHORT TERM GOAL #1   Title R shoulder PROM forward flexion to 140, ER at side to 40, & ABD without rotation to 80 by 01/24/17    Status Achieved     PT SHORT TERM GOAL #2   Title independent with basic HEP by 01/17/17   Status Achieved           PT Long Term Goals - 03/25/17 0808      PT LONG TERM GOAL #1   Title R shoulder AROM WFL by 05/09/17   Status On-going  met for gravity minimized AROM; nearly met for AROM against gravity     PT LONG TERM GOAL #2   Title pt is able to return to all ADLs, chores, and work duties without limitation by shoulder pain, LOM, or weakness by 05/09/17.   Status On-going     PT LONG TERM GOAL #3    Title pt able to sleep without limitation by shoulder pain by 05/09/17   Status On-going     PT LONG TERM GOAL #4   Title R shoulder MMT >/= to 4/5 by 05/09/17   Status On-going               Plan - 04/01/17 0802    Clinical Impression Statement Pt. pain free today and doing well.  Prone scapular strengthening progressed today on physioball and tolerated well.  Proprioception activities  progressed today with progression of body blade and rhythmic stabilization, and addition of side<>side in partial pushup position on BOSU ball.  Pt. able to perform 3# flexion, scaption with less pain today.  Pt. progressing well and will continue to benefit from further skilled therapy to maximize UE strength ROM, and return to function.     PT Treatment/Interventions Patient/family education;ADLs/Self Care Home Management;Passive range of motion;Manual techniques;Therapeutic exercise;Therapeutic activities;Cryotherapy;Vasopneumatic Device;Moist Heat;Electrical Stimulation;Scar mobilization;Taping;Iontophoresis 61m/ml Dexamethasone   PT Next Visit Plan Gradually progress scapular stabilization & RTC strengthening per tolerance & protocol; Manual therapy for STM/MFR, PROM & joint mobs as indicated for pain and ROM; Modalities PRN      Patient will benefit from skilled therapeutic intervention in order to improve the following deficits and impairments:  Decreased range of motion, Decreased strength, Pain, Impaired UE functional use, Decreased activity tolerance, Decreased scar mobility, Postural dysfunction  Visit Diagnosis: Right shoulder pain, unspecified chronicity  Stiffness of right shoulder, not elsewhere classified  Abnormal posture  Muscle weakness (generalized)     Problem List Patient Active Problem List   Diagnosis Date Noted  . Dyslipidemia (high LDL; low HDL) 08/15/2016  . Complete rotator cuff tear of left shoulder 07/05/2015  . Tennis elbow 05/30/2014  . Rotator cuff (capsule)  sprain 03/10/2014  . Right shoulder pain 02/11/2014  . Memory difficulty 12/18/2012  . Gynecomastia, male 07/30/2012  . OSA (obstructive sleep apnea) 04/23/2011  . EXTERNAL HEMORRHOIDS 02/12/2011  . [place] 11/12[place]  . PLANTAR FASCIITIS 04/09/2010  . Hypothyroidism 12/08/2009  . NEOPLASM OF UNCERTAIN BEHAVIOR OF SKIN 03/10/2009  . OSTEOARTHRITIS 02/14/2009  . HEARING DEFICIT 02/08/2009  . Essential hypertension 02/08/2009  . ALLERGIC RHINITIS 02/08/2009  . GERD 02/08/2009  . PROTEINURIA 02/08/2009  . DIVERTICULITIS, HX OF 02/08/2009    MBess Harvest PTA 04/01/17 12:06 PM   [facility] [address] [place] [address]Phone: [phone]  Fax:  [phone] Name: FTOBENNA NEEDSMRN: [id number] of Birth: 109-Mar-1952

## 2017-04-03 ENCOUNTER — Ambulatory Visit: Payer: 59 | Admitting: Physical Therapy

## 2017-04-03 DIAGNOSIS — M6281 Muscle weakness (generalized): Secondary | ICD-10-CM | POA: Diagnosis not present

## 2017-04-03 DIAGNOSIS — M25611 Stiffness of right shoulder, not elsewhere classified: Secondary | ICD-10-CM

## 2017-04-03 DIAGNOSIS — M25511 Pain in right shoulder: Secondary | ICD-10-CM

## 2017-04-03 DIAGNOSIS — R293 Abnormal posture: Secondary | ICD-10-CM | POA: Diagnosis not present

## 2017-04-03 MED FILL — CMP-QDRL/LIDO/MYLAN/NYS1111: 3 days supply | Qty: 120 | Fill #0

## 2017-04-03 NOTE — Therapy (Signed)
[facility] [address]  [place], [place], [address] Phone: [phone]   Fax:  [phone]  Physical Therapy Treatment  Patient Details  Name: Zachary [name] MRN: [id number] Date of Birth: [birth year] Referring Provider: Dr. Corky Mull  Encounter Date: 04/03/2017      PT End of Session - 04/03/17 0759    Visit Number 26   Number of Visits 38   Date for PT Re-Evaluation 05/09/17   PT Start Time 0759   PT Stop Time 0842   PT Time Calculation (min) 43 min   Activity Tolerance Patient tolerated treatment well   Behavior During Therapy [facility] for tasks assessed/performed      Past Medical History:  Diagnosis Date  . Arthritis   . Articular cartilage disease    left shoulder  . Cancer ([place]) 2015   positive prostate cancer bx  . Diverticulitis   . Dyslipidemia (high LDL; low HDL) 08/15/2016  . GERD (gastroesophageal reflux disease)   . Hearing problem    hearing deficit  . Hemorrhoids   . Hypertension   . Hypothyroidism   . Sleep apnea    uses a cpap  . Wears glasses   . Wears hearing aid    both ears    Past Surgical History:  Procedure Laterality Date  . BACK SURGERY  1978   herniated disksurgery  . CARDIAC CATHETERIZATION  08/01/2010  . COLONOSCOPY    . EXAM UNDER ANESTHESIA WITH MANIPULATION OF SHOULDER Right 09/15/2014   Procedure: RIGHT SHOULDER MANIPULATION UNDER ANESTHESIA;  Surgeon: Ninetta Lights, MD;  Location: [place];  Service: Orthopedics;  Laterality: Right;  . HEMORRHOID SURGERY  08/2006  . SHOULDER ARTHROSCOPY WITH DISTAL CLAVICLE RESECTION Left 07/07/2015   Procedure: SHOULDER ARTHROSCOPY WITH DISTAL CLAVICLE RESECTION;  Surgeon: Kathryne Hitch, MD;  Location: [place];  Service: Orthopedics;  Laterality: Left;  . SHOULDER ARTHROSCOPY WITH ROTATOR CUFF REPAIR Left 12/07/2015   Procedure: LEFT SHOULDER ARTHROSCOPY DEBRIDEMENT, WITH ROTATOR CUFF  REPAIR;  Surgeon: Ninetta Lights, MD;  Location: [place];  Service: Orthopedics;  Laterality: Left;  . SHOULDER ARTHROSCOPY WITH ROTATOR CUFF REPAIR AND SUBACROMIAL DECOMPRESSION Left 07/07/2015   Procedure: LEFT SHOULDER SCOPE DEBRIDEMENT, SUBACROMIAL DECOMPRESSION, DISTAL CLAVICULECTOMY, ROTATOR CUFF REPAIR  ;  Surgeon: Kathryne Hitch, MD;  Location: [place];  Service: Orthopedics;  Laterality: Left;  ANESTHESIA: GENERAL, PRE/POST OP SCALENE  . SHOULDER ARTHROSCOPY WITH SUBACROMIAL DECOMPRESSION, ROTATOR CUFF REPAIR AND BICEP TENDON REPAIR Right 03/10/2014   Procedure: RIGHT SHOULDER ARTHROSCOPY WITH SUBACROMIAL DECOMPRESSION, PARTIAL ACROMIOPLASTY WITH CORACOAROMIAL LABRUM DEBRIDEMENT RELEASE DISTAL CLAVICULECTOMY,  ROTATOR CUFF REPAIR AND EXTENSIVE DEBRIDEMENT;  Surgeon: Ninetta Lights, MD;  Location: [place];  Service: Orthopedics;  Laterality: Right;  . TENDON REPAIR  June 06, 2011   right elbow, Dr. Percell Miller    There were no vitals filed for this visit.      Subjective Assessment - 04/03/17 0802    Subjective No current issues with shoulder but his back is still bothering him.   Patient Stated Goals "get back to work"    Currently in Pain? No/denies            [facility] PT Assessment - 04/03/17 0759      AROM   Right Shoulder Flexion 170 Degrees   Right Shoulder ABduction 168 Degrees  [facility] Adult PT Treatment/Exercise - 04/03/17 0759      Shoulder Exercises: Standing   Horizontal ABduction Both;15 reps;Theraband   Theraband Level (Shoulder Horizontal ABduction) Level 3 (Green)   Horizontal ABduction Limitations standing against 6" FR on wall   External Rotation Right;15 reps;Theraband   Theraband Level (Shoulder External Rotation) Level 2 (Red)   External Rotation Limitations shoudler at 90 dg ABD   Internal Rotation Right;20 reps;Theraband   Theraband Level (Shoulder Internal Rotation) Level  2 (Red)   Internal Rotation Limitations shoudler at 90 dg ABD   Flexion Right;15 reps;Theraband   Theraband Level (Shoulder Flexion) Level 3 (Green)   ABduction Right;10 reps;Theraband   Theraband Level (Shoulder ABduction) Level 3 (Green)   ABduction Limitations slight scaption   Row Both;15 reps;Theraband   Theraband Level (Shoulder Row) Level 3 (Green)   Row Limitations "W" row     Shoulder Exercises: Therapy Ball   Flexion 15 reps   Flexion Limitations 3 # cuff wt on R; standing roll-up with orange (55 cm) Pball on wall with stretch at end ROM   ABduction 15 reps   ABduction Limitations 3# cuff wt on R; standing roll-up with orange (55 cm) Pball on wall with stretch at end ROM     Shoulder Exercises: ROM/Strengthening   UBE (Upper Arm Bike) lvl 4.0 fwd/back 2' each   Cybex Press 20 reps   Cybex Press Limitations 15#, chest press +   Ball on Wall Shoulder circles with small ball above shoulder height flexion, ABD in scapular plan CW/CCW 3# cuff wt x15 each   Rhythmic Stabilization, Seated Standing - L arm in full flexion - flex <> ext oscillation with red TB x20 each   Other ROM/Strengthening Exercises BATCA Pulldown 25# x20     Shoulder Exercises: Body Blade   Flexion 30 seconds;2 reps   Flexion Limitations 0-90 dg arc    ABduction 30 seconds;2 reps   ABduction Limitations Scaption 0-90 dg    Other Body Blade Exercises IR/ER with neutral shoulder and elbow flexed to 90 dg 2x30"     Manual Therapy   Manual Therapy Soft tissue mobilization;Myofascial release;Passive ROM   Soft tissue mobilization R subscapularis & teres minor   Myofascial Release TPR R subscap   Passive ROM MWM R shoulder flexion & abduction                  PT Short Term Goals - 01/23/17 0951      PT SHORT TERM GOAL #1   Title R shoulder PROM forward flexion to 140, ER at side to 40, & ABD without rotation to 80 by 01/24/17    Status Achieved     PT SHORT TERM GOAL #2   Title independent with  basic HEP by 01/17/17   Status Achieved           PT Long Term Goals - 03/25/17 0808      PT LONG TERM GOAL #1   Title R shoulder AROM WFL by 05/09/17   Status On-going  met for gravity minimized AROM; nearly met for AROM against gravity     PT LONG TERM GOAL #2   Title pt is able to return to all ADLs, chores, and work duties without limitation by shoulder pain, LOM, or weakness by 05/09/17.   Status On-going     PT LONG TERM GOAL #3   Title pt able to sleep without limitation by shoulder pain by 05/09/17   Status On-going  PT LONG TERM GOAL #4   Title R shoulder MMT >/= to 4/5 by 05/09/17   Status On-going               Plan - 04/03/17 0803    Clinical Impression Statement Pt continues to demonstrate increased tendency for shoulder hike with overhead motion into scaption/abduction with increased tightness noted in posterior/inferior shoulder musculature and TP in subscap. After STM/MFR, pt able to demonstrate full AROM flexion and abduction w/o substitution. Continued strengtening in overhead range with fatigue noted but no pain.   Rehab Potential Good   Clinical Impairments Affecting Rehab Potential history of multiple bilateral RTC surgeries; history of R elbow surgery   PT Treatment/Interventions Patient/family education;ADLs/Self Care Home Management;Passive range of motion;Manual techniques;Therapeutic exercise;Therapeutic activities;Cryotherapy;Vasopneumatic Device;Moist Heat;Electrical Stimulation;Scar mobilization;Taping;Iontophoresis 30m/ml Dexamethasone   PT Next Visit Plan Gradually progress scapular stabilization & RTC strengthening per tolerance & protocol; Manual therapy for STM/MFR, PROM & joint mobs as indicated for pain and ROM; Modalities PRN   Consulted and Agree with Plan of Care Patient      Patient will benefit from skilled therapeutic intervention in order to improve the following deficits and impairments:  Decreased range of motion, Decreased  strength, Pain, Impaired UE functional use, Decreased activity tolerance, Decreased scar mobility, Postural dysfunction  Visit Diagnosis: Right shoulder pain, unspecified chronicity  Stiffness of right shoulder, not elsewhere classified  Abnormal posture  Muscle weakness (generalized)     Problem List Patient Active Problem List   Diagnosis Date Noted  . Dyslipidemia (high LDL; low HDL) 08/15/2016  . Complete rotator cuff tear of left shoulder 07/05/2015  . Tennis elbow 05/30/2014  . Rotator cuff (capsule) sprain 03/10/2014  . Right shoulder pain 02/11/2014  . Memory difficulty 12/18/2012  . Gynecomastia, male 07/30/2012  . OSA (obstructive sleep apnea) 04/23/2011  . EXTERNAL HEMORRHOIDS 02/12/2011  . Prostate cancer ([place] 11/12/2010  . PLANTAR FASCIITIS 04/09/2010  . Hypothyroidism 12/08/2009  . NEOPLASM OF UNCERTAIN BEHAVIOR OF SKIN 03/10/2009  . OSTEOARTHRITIS 02/14/2009  . HEARING DEFICIT 02/08/2009  . Essential hypertension 02/08/2009  . ALLERGIC RHINITIS 02/08/2009  . GERD 02/08/2009  . PROTEINURIA 02/08/2009  . DIVERTICULITIS, HX OF 02/08/2009    JPercival Spanish PT, MPT 04/03/2017, 8:48 AM  [facility] [place] [address]Phone: [phone]  Fax:  [phone] Name: Zachary KERSHMRN: [id number] of Birth: 1Nov 06, 1952

## 2017-04-05 ENCOUNTER — Encounter: Payer: Self-pay | Admitting: Emergency Medicine

## 2017-04-05 ENCOUNTER — Emergency Department
Admission: EM | Admit: 2017-04-05 | Discharge: 2017-04-05 | Disposition: A | Discharge status: Home / Self Care | Payer: 59 | Source: Home / Self Care | Attending: Family Medicine | Admitting: Family Medicine

## 2017-04-05 DIAGNOSIS — K121 Other forms of stomatitis: Secondary | ICD-10-CM | POA: Diagnosis not present

## 2017-04-05 DIAGNOSIS — K0510 Chronic gingivitis, plaque induced: Secondary | ICD-10-CM | POA: Diagnosis not present

## 2017-04-05 LAB — POCT CBC W AUTO DIFF (K'VILLE URGENT CARE)

## 2017-04-05 LAB — VITAMIN B12: VITAMIN B 12: 798 pg/mL (ref 200–1100)

## 2017-04-05 MED ORDER — DOXYCYCLINE HYCLATE 100 MG PO CAPS: 100.0000 mg | ORAL_CAPSULE | Freq: Two times a day (BID) | ORAL | 0 refills | Status: DC

## 2017-04-05 MED ORDER — CHLORHEXIDINE GLUCONATE 0.12 % MT SOLN: 15.0000 mL | Freq: Two times a day (BID) | OROMUCOSAL | 0 refills | Status: DC

## 2017-04-05 MED ORDER — TRIAMCINOLONE ACETONIDE 0.1 % MT PSTE: PASTE | OROMUCOSAL | 1 refills | Status: DC

## 2017-04-05 NOTE — ED Provider Notes (Signed)
[name]    CSN: [id number] Arrival date & time: 04/05/17  0946     History   Chief Complaint Chief Complaint  Patient presents with  . Mouth Lesions    HPI Zachary [name] is a 66 y.o. male.   Patient complains of 3 month history of persistent sores in his mouth and gums, including sore tongue and lips.  His gums remain slightly swollen and bleed frequently.  He has had mild symptomatic improvement after taking prednisone, and using both lidocaine viscous solution and Magic Mouthwash.  He feels well otherwise.  No fevers, chills, and sweats.  No toothache.  No sore throat or difficulty swallowing.   The history is provided by the patient and the spouse.  Mouth Lesions  Location:  Upper lip, lower lip, upper gingiva, lower gingiva, tongue and posterior pharynx Upper lip location:  L outer and R outer Lower lip location:  L outer and R outer Upper gingiva location:  L buccal, R buccal, L lingual and R lingual Lower gingiva location:  L buccal, R buccal, L lingual and R lingual Quality:  Red and painful Pain details:    Quality:  Aching and burning   Severity:  Moderate   Duration:  3 months   Timing:  Constant   Progression:  Worsening Onset quality:  Gradual Severity:  Moderate Duration:  3 months Progression:  Worsening Chronicity:  Chronic Context: not a change in diet, not a change in medications, not medications and not trauma   Relieved by:  Nothing Worsened by:  Eating Ineffective treatments:  Prescription drugs Associated symptoms: no congestion, no dental pain, no fever, no malaise, no neck pain, no rash, no rhinorrhea, no sore throat and no swollen glands     Past Medical History:  Diagnosis Date  . Arthritis   . Articular cartilage disease    left shoulder  . Cancer ([place]) 2015   positive prostate cancer bx  . Diverticulitis   . Dyslipidemia (high LDL; low HDL) 08/15/2016  . GERD (gastroesophageal reflux disease)   . Hearing problem      hearing deficit  . Hemorrhoids   . Hypertension   . Hypothyroidism   . Sleep apnea    uses a cpap  . Wears glasses   . Wears hearing aid    both ears    Patient Active Problem List   Diagnosis Date Noted  . Dyslipidemia (high LDL; low HDL) 08/15/2016  . Complete rotator cuff tear of left shoulder 07/05/2015  . Tennis elbow 05/30/2014  . Rotator cuff (capsule) sprain 03/10/2014  . Right shoulder pain 02/11/2014  . Memory difficulty 12/18/2012  . Gynecomastia, male 07/30/2012  . OSA (obstructive sleep apnea) 04/23/2011  . EXTERNAL HEMORRHOIDS 02/12/2011  . Prostate cancer ([place]) 11/12/2010  . PLANTAR FASCIITIS 04/09/2010  . Hypothyroidism 12/08/2009  . NEOPLASM OF UNCERTAIN BEHAVIOR OF SKIN 03/10/2009  . OSTEOARTHRITIS 02/14/2009  . HEARING DEFICIT 02/08/2009  . Essential hypertension 02/08/2009  . ALLERGIC RHINITIS 02/08/2009  . GERD 02/08/2009  . PROTEINURIA 02/08/2009  . DIVERTICULITIS, HX OF 02/08/2009    Past Surgical History:  Procedure Laterality Date  . BACK SURGERY  1978   herniated disksurgery  . CARDIAC CATHETERIZATION  08/01/2010  . COLONOSCOPY    . EXAM UNDER ANESTHESIA WITH MANIPULATION OF SHOULDER Right 09/15/2014   Procedure: RIGHT SHOULDER MANIPULATION UNDER ANESTHESIA;  Surgeon: Ninetta Lights, MD;  Location: [facility];  Service: Orthopedics;  Laterality: Right;  . HEMORRHOID SURGERY  08/2006  . SHOULDER ARTHROSCOPY WITH DISTAL CLAVICLE RESECTION Left 07/07/2015   Procedure: SHOULDER ARTHROSCOPY WITH DISTAL CLAVICLE RESECTION;  Surgeon: Kathryne Hitch, MD;  Location: [facility];  Service: Orthopedics;  Laterality: Left;  . SHOULDER ARTHROSCOPY WITH ROTATOR CUFF REPAIR Left 12/07/2015   Procedure: LEFT SHOULDER ARTHROSCOPY DEBRIDEMENT, WITH ROTATOR CUFF REPAIR;  Surgeon: Ninetta Lights, MD;  Location: [facility];  Service: Orthopedics;  Laterality: Left;  . SHOULDER ARTHROSCOPY WITH ROTATOR CUFF REPAIR AND  SUBACROMIAL DECOMPRESSION Left 07/07/2015   Procedure: LEFT SHOULDER SCOPE DEBRIDEMENT, SUBACROMIAL DECOMPRESSION, DISTAL CLAVICULECTOMY, ROTATOR CUFF REPAIR  ;  Surgeon: Kathryne Hitch, MD;  Location: [facility];  Service: Orthopedics;  Laterality: Left;  ANESTHESIA: GENERAL, PRE/POST OP SCALENE  . SHOULDER ARTHROSCOPY WITH SUBACROMIAL DECOMPRESSION, ROTATOR CUFF REPAIR AND BICEP TENDON REPAIR Right 03/10/2014   Procedure: RIGHT SHOULDER ARTHROSCOPY WITH SUBACROMIAL DECOMPRESSION, PARTIAL ACROMIOPLASTY WITH CORACOAROMIAL LABRUM DEBRIDEMENT RELEASE DISTAL CLAVICULECTOMY,  ROTATOR CUFF REPAIR AND EXTENSIVE DEBRIDEMENT;  Surgeon: Ninetta Lights, MD;  Location: [facility];  Service: Orthopedics;  Laterality: Right;  . TENDON REPAIR  June 06, 2011   right elbow, Dr. Percell Miller       Home Medications    Prior to Admission medications   Medication Sig Start Date End Date Taking? Authorizing Provider  acetaminophen (TYLENOL) 500 MG tablet Take 500 mg by mouth every 6 (six) hours as needed.    Historical Provider, MD  aspirin EC 81 MG EC tablet Take 81 mg by mouth daily.      Historical Provider, MD  chlorhexidine (PERIDEX) 0.12 % solution Use as directed 15 mLs in the mouth or throat 2 (two) times daily. "Swish and spit." 04/05/17   Kandra Nicolas, MD  Cyanocobalamin (VITAMIN B 12 PO) Take 500 mg by mouth daily.    Historical Provider, MD  cyclobenzaprine (FLEXERIL) 5 MG tablet Take 1 tablet (5 mg total) by mouth 3 (three) times daily as needed for muscle spasms. 03/24/17   [name], DO  doxycycline (VIBRAMYCIN) 100 MG capsule Take 1 capsule (100 mg total) by mouth 2 (two) times daily. Take with food. 04/05/17   Kandra Nicolas, MD  fexofenadine (ALLEGRA) 180 MG tablet Take 1 tablet (180 mg total) by mouth daily. 02/20/17 03/12/17  [name], DO  hydrocortisone (ANUSOL-HC) 2.5 % rectal cream Place 1 application rectally at bedtime as needed for hemorrhoids  or itching. 11/06/16   Irene Shipper, MD  levothyroxine (SYNTHROID, LEVOTHROID) 100 MCG tablet TAKE 1 TABLET BY MOUTH DAILY 03/31/17   [name], DO  lidocaine (XYLOCAINE) 2 % solution Use as directed 10 mLs in the mouth or throat every 6 (six) hours as needed for mouth pain. 02/24/17   [name], DO  lisinopril (PRINIVIL,ZESTRIL) 5 MG tablet Take 1 tablet (5 mg total) by mouth daily. 10/07/16   [name], DO  loratadine (CLARITIN) 10 MG tablet Take 10 mg by mouth daily.      Historical Provider, MD  Magnesium 250 MG TABS Take 1 tablet by mouth daily.    Historical Provider, MD  Melatonin 5 MG CAPS Take 2 capsules by mouth daily.    Historical Provider, MD  Multiple Vitamins-Minerals (CENTRUM SILVER PO) Take 1 tablet by mouth daily.    Historical Provider, MD  naproxen (EC NAPROSYN) 500 MG EC tablet Take 1 tablet (500 mg total) by mouth 2 (two) times daily with a meal. 03/24/17   Hart Carwin  Nolene Ebbs, DO  omeprazole (PRILOSEC) 40 MG capsule TAKE 1 CAPSULE (40 MG TOTAL) BY MOUTH DAILY. 02/28/17   [name], DO  pravastatin (PRAVACHOL) 40 MG tablet Take 0.5 tablets (20 mg total) by mouth daily. 03/11/17   [name], DO  ranitidine (ZANTAC) 150 MG tablet Take 1 tablet (150 mg total) by mouth 2 (two) times daily. 02/20/17 02/27/17  Crosby Oyster Wendling, DO  sodium bicarbonate 650 MG tablet Take 1 tablet (650 mg total) by mouth daily. 11/25/16   [name], DO  triamcinolone (KENALOG) 0.1 % paste Place thin layer on mouth ulcers four times daily 04/05/17   Kandra Nicolas, MD    Family History Family History  Problem Relation Age of Onset  . Heart disease Father   . Breast cancer Mother   . Cancer Brother   . Alcohol abuse Other   . Arthritis Other   . Cancer Other     Breast, Prostate  . Coronary artery disease Other   . Irritable bowel syndrome Other   . Cystic fibrosis Other   . Colon cancer Neg Hx     Social  History Social History  Substance Use Topics  . Smoking status: Never Smoker  . Smokeless tobacco: Never Used  . Alcohol use No     Allergies   Oxycodone and Simvastatin   Review of Systems Review of Systems  Constitutional: Negative for fever.  HENT: Positive for mouth sores. Negative for congestion, rhinorrhea and sore throat.   Musculoskeletal: Negative for neck pain.  Skin: Negative for rash.  All other systems reviewed and are negative.    Physical Exam Triage Vital Signs ED Triage Vitals [04/05/17 1017]  Enc Vitals Group     BP 134/84     Pulse Rate 76     Resp      Temp 98.3 F (36.8 C)     Temp Source Oral     SpO2 93 %     Weight 203 lb 8 oz (92.3 kg)     Height 5\' 8"  (1.727 m)     Head Circumference      Peak Flow      Pain Score 9     Pain Loc      Pain Edu?      Excl. in [place]?    No data found.   Updated Vital Signs BP 134/84 (BP Location: Left Arm)   Pulse 76   Temp 98.3 F (36.8 C) (Oral)   Ht 5\' 8"  (1.727 m)   Wt 203 lb 8 oz (92.3 kg)   SpO2 93%   BMI 30.94 kg/m   Visual Acuity Right Eye Distance:   Left Eye Distance:   Bilateral Distance:    Right Eye Near:   Left Eye Near:    Bilateral Near:     Physical Exam  Constitutional: He appears well-developed and well-nourished. No distress.  HENT:  Head: Normocephalic.  Right Ear: Tympanic membrane, external ear and ear canal normal.  Left Ear: Tympanic membrane, external ear and ear canal normal.  Nose: Nose normal.  Mouth/Throat: No trismus in the jaw. No oropharyngeal exudate.    Soft palate reveals erythema and scattered aphthous ulcers.  Upper and lower gingiva are inflamed and tender to palpation.  Plaque is present on numerous teeth with gingival recession.  External lips are mildly excoriated and tender to palpation.  No adenopathy present.  Eyes: Pupils are equal, round, and reactive to light.  Neck:  Neck supple.  Cardiovascular: Normal heart sounds.   Pulmonary/Chest:  Breath sounds normal.  Lymphadenopathy:    He has no cervical adenopathy.  Neurological: He is alert.  Skin: Skin is warm and dry. No rash noted.  Nursing note and vitals reviewed.    UC Treatments / Results  Labs (all labs ordered are listed, but only abnormal results are displayed) Labs Reviewed  VITAMIN B12  VITAMIN D 25 HYDROXY (VIT D DEFICIENCY, FRACTURES)  POCT CBC W AUTO DIFF ([facility]):  WBC 6.4; LY 27.6; MO 8.7; GR 63.7; Hgb 14.9; Platelets 204     EKG  EKG Interpretation None       Radiology No results found.  Procedures Procedures (including critical care time)  Medications Ordered in UC Medications - No data to display   Initial Impression / Assessment and Plan / UC Course  I have reviewed the triage vital signs and the nursing notes.  Pertinent labs & imaging results that were available during my care of the patient were reviewed by me and considered in my medical decision making (see chart for details).    Begin empiric doxycycline 100mg  BID. Rx for Kenalog in [place].  Begin Peridex mouth wash. Begin Vitamin C 300mg  daily. Continue multiple daily vitamin. Vitamin B12 and Vitamin D levels pending. Followup with periodontist as soon as possible.  Recommend ENT evaluation as well.   Final Clinical Impressions(s) / UC Diagnoses   Final diagnoses:  Gingivitis, chronic  Stomatitis    New Prescriptions New Prescriptions   CHLORHEXIDINE (PERIDEX) 0.12 % SOLUTION    Use as directed 15 mLs in the mouth or throat 2 (two) times daily. "Swish and spit."   DOXYCYCLINE (VIBRAMYCIN) 100 MG CAPSULE    Take 1 capsule (100 mg total) by mouth 2 (two) times daily. Take with food.   TRIAMCINOLONE (KENALOG) 0.1 % PASTE    Place thin layer on mouth ulcers four times daily     Kandra Nicolas, MD 04/08/17 2315

## 2017-04-05 NOTE — ED Triage Notes (Signed)
Pt c/o mouth swollen on the inside, been having problems since February, has an appt with an ENT on 04/17/2017, tried different medications, can't eat, blisters on lips.

## 2017-04-05 NOTE — Discharge Instructions (Signed)
Begin Vitamin C, 300mg  once daily.

## 2017-04-07 ENCOUNTER — Telehealth: Payer: Self-pay | Admitting: Emergency Medicine

## 2017-04-07 ENCOUNTER — Other Ambulatory Visit: Payer: Self-pay | Admitting: Family Medicine

## 2017-04-07 DIAGNOSIS — I10 Essential (primary) hypertension: Secondary | ICD-10-CM

## 2017-04-07 LAB — VITAMIN D 25 HYDROXY (VIT D DEFICIENCY, FRACTURES): Vit D, 25-Hydroxy: 33 ng/mL (ref 30–100)

## 2017-04-07 NOTE — Telephone Encounter (Signed)
Rx sent to the pharmacy by e-script.//AB/CMA 

## 2017-04-08 ENCOUNTER — Ambulatory Visit: Payer: 59 | Admitting: Physical Therapy

## 2017-04-08 DIAGNOSIS — M25611 Stiffness of right shoulder, not elsewhere classified: Secondary | ICD-10-CM | POA: Diagnosis not present

## 2017-04-08 DIAGNOSIS — M25511 Pain in right shoulder: Secondary | ICD-10-CM | POA: Diagnosis not present

## 2017-04-08 DIAGNOSIS — M6281 Muscle weakness (generalized): Secondary | ICD-10-CM | POA: Diagnosis not present

## 2017-04-08 DIAGNOSIS — R293 Abnormal posture: Secondary | ICD-10-CM

## 2017-04-08 MED FILL — LISINOPRIL 5 MG TABLET: 5 | 90 days supply | Qty: 90 | Fill #0

## 2017-04-08 NOTE — Therapy (Signed)
[facility] [address]  [place], [place], [address] Phone: [phone]   Fax:  [phone]  Physical Therapy Treatment  Patient Details  Name: Zachary [name] MRN: [id number] Date of Birth: [birth year] Referring Provider: Dr. Corky Mull  Encounter Date: 04/08/2017      PT End of Session - 04/08/17 0756    Visit Number 27   Number of Visits 38   Date for PT Re-Evaluation 05/09/17   PT Start Time 0756   PT Stop Time 0839   PT Time Calculation (min) 43 min   Activity Tolerance Patient tolerated treatment well   Behavior During Therapy [facility] for tasks assessed/performed      Past Medical History:  Diagnosis Date  . Arthritis   . Articular cartilage disease    left shoulder  . Cancer ([place]) 2015   positive prostate cancer bx  . Diverticulitis   . Dyslipidemia (high LDL; low HDL) 08/15/2016  . GERD (gastroesophageal reflux disease)   . Hearing problem    hearing deficit  . Hemorrhoids   . Hypertension   . Hypothyroidism   . Sleep apnea    uses a cpap  . Wears glasses   . Wears hearing aid    both ears    Past Surgical History:  Procedure Laterality Date  . BACK SURGERY  1978   herniated disksurgery  . CARDIAC CATHETERIZATION  08/01/2010  . COLONOSCOPY    . EXAM UNDER ANESTHESIA WITH MANIPULATION OF SHOULDER Right 09/15/2014   Procedure: RIGHT SHOULDER MANIPULATION UNDER ANESTHESIA;  Surgeon: Ninetta Lights, MD;  Location: [facility];  Service: Orthopedics;  Laterality: Right;  . HEMORRHOID SURGERY  08/2006  . SHOULDER ARTHROSCOPY WITH DISTAL CLAVICLE RESECTION Left 07/07/2015   Procedure: SHOULDER ARTHROSCOPY WITH DISTAL CLAVICLE RESECTION;  Surgeon: Kathryne Hitch, MD;  Location: [facility];  Service: Orthopedics;  Laterality: Left;  . SHOULDER ARTHROSCOPY WITH ROTATOR CUFF REPAIR Left 12/07/2015   Procedure: LEFT SHOULDER ARTHROSCOPY DEBRIDEMENT, WITH ROTATOR CUFF  REPAIR;  Surgeon: Ninetta Lights, MD;  Location: [facility];  Service: Orthopedics;  Laterality: Left;  . SHOULDER ARTHROSCOPY WITH ROTATOR CUFF REPAIR AND SUBACROMIAL DECOMPRESSION Left 07/07/2015   Procedure: LEFT SHOULDER SCOPE DEBRIDEMENT, SUBACROMIAL DECOMPRESSION, DISTAL CLAVICULECTOMY, ROTATOR CUFF REPAIR  ;  Surgeon: Kathryne Hitch, MD;  Location: [facility];  Service: Orthopedics;  Laterality: Left;  ANESTHESIA: GENERAL, PRE/POST OP SCALENE  . SHOULDER ARTHROSCOPY WITH SUBACROMIAL DECOMPRESSION, ROTATOR CUFF REPAIR AND BICEP TENDON REPAIR Right 03/10/2014   Procedure: RIGHT SHOULDER ARTHROSCOPY WITH SUBACROMIAL DECOMPRESSION, PARTIAL ACROMIOPLASTY WITH CORACOAROMIAL LABRUM DEBRIDEMENT RELEASE DISTAL CLAVICULECTOMY,  ROTATOR CUFF REPAIR AND EXTENSIVE DEBRIDEMENT;  Surgeon: Ninetta Lights, MD;  Location: [facility];  Service: Orthopedics;  Laterality: Right;  . TENDON REPAIR  June 06, 2011   right elbow, Dr. Percell Miller    There were no vitals filed for this visit.      Subjective Assessment - 04/08/17 0801    Subjective Pt states shoulder is bothering him today, aching every now and then. Thinks it may be due to the cold weather.   Patient Stated Goals "get back to work"    Currently in Pain? No/denies                         [facility] Adult PT Treatment/Exercise - 04/08/17 0756      Shoulder Exercises: Prone   Other  Prone Exercises Alt protraction rock on BOSU (lower body supported on mat table) x15     Shoulder Exercises: Standing   Horizontal ABduction Both;15 reps;Theraband   Theraband Level (Shoulder Horizontal ABduction) Level 3 (Green)   Horizontal ABduction Limitations standing against 6" FR on wall   External Rotation Right;15 reps;Theraband   Theraband Level (Shoulder External Rotation) Level 2 (Red)   External Rotation Limitations shoudler at 90 dg ABD   Internal Rotation Right;20 reps;Theraband   Theraband Level  (Shoulder Internal Rotation) Level 2 (Red)   Internal Rotation Limitations shoudler at 90 dg ABD   Row Both;15 reps   Row Limitations TRX low row   Other Standing Exercises Alt flexion/extension diagonal + scap retraction with red TB standing against 6" FR on wall x15   Other Standing Exercises "W" row with green TB 15x3"     Shoulder Exercises: Pulleys   Flexion 3 minutes   ABduction 3 minutes   ABduction Limitations in scapular plane     Shoulder Exercises: ROM/Strengthening   UBE (Upper Arm Bike) lvl 4.0 fwd/back 3' each   Cybex Press 15 reps   Cybex Press Limitations 20#, chest press +   Wall Pushups 10 reps   Pushups Limitations TRX at ~60 dg angle   Rhythmic Stabilization, Seated Standing - L arm in full flexion - flex <> ext oscillation with red TB x20 each   Other ROM/Strengthening Exercises BATCA [place] 35# x15                  PT Short Term Goals - 01/23/17 0951      PT SHORT TERM GOAL #1   Title R shoulder PROM forward flexion to 140, ER at side to 40, & ABD without rotation to 80 by 01/24/17    Status Achieved     PT SHORT TERM GOAL #2   Title independent with basic HEP by 01/17/17   Status Achieved           PT Long Term Goals - 03/25/17 0808      PT LONG TERM GOAL #1   Title R shoulder AROM WFL by 05/09/17   Status On-going  met for gravity minimized AROM; nearly met for AROM against gravity     PT LONG TERM GOAL #2   Title pt is able to return to all ADLs, chores, and work duties without limitation by shoulder pain, LOM, or weakness by 05/09/17.   Status On-going     PT LONG TERM GOAL #3   Title pt able to sleep without limitation by shoulder pain by 05/09/17   Status On-going     PT LONG TERM GOAL #4   Title R shoulder MMT >/= to 4/5 by 05/09/17   Status On-going               Plan - 04/08/17 0804    Clinical Impression Statement Pt continues to tolerate strengthening exercise progression with increased emphasis on overhead range  with some fatigue but no pain. Pt noting shoulder "bothering" him due to drop in temp, but denies pain and no interference with exercises today. Pt will benefit from continued PT to maximize full return of functional ROM and strength in R shoulder.   Rehab Potential Good   Clinical Impairments Affecting Rehab Potential history of multiple bilateral RTC surgeries; history of R elbow surgery   PT Treatment/Interventions Patient/family education;ADLs/Self Care Home Management;Passive range of motion;Manual techniques;Therapeutic exercise;Therapeutic activities;Cryotherapy;Vasopneumatic Device;Moist Heat;Electrical Stimulation;Scar mobilization;Taping;Iontophoresis 27m/ml Dexamethasone  PT Next Visit Plan Gradually progress scapular stabilization & RTC strengthening per tolerance & protocol; Manual therapy for STM/MFR, PROM & joint mobs as indicated for pain and ROM; Modalities PRN   Consulted and Agree with Plan of Care Patient      Patient will benefit from skilled therapeutic intervention in order to improve the following deficits and impairments:  Decreased range of motion, Decreased strength, Pain, Impaired UE functional use, Decreased activity tolerance, Decreased scar mobility, Postural dysfunction  Visit Diagnosis: Right shoulder pain, unspecified chronicity  Stiffness of right shoulder, not elsewhere classified  Abnormal posture  Muscle weakness (generalized)     Problem List Patient Active Problem List   Diagnosis Date Noted  . Dyslipidemia (high LDL; low HDL) 08/15/2016  . Complete rotator cuff tear of left shoulder 07/05/2015  . Tennis elbow 05/30/2014  . Rotator cuff (capsule) sprain 03/10/2014  . Right shoulder pain 02/11/2014  . Memory difficulty 12/18/2012  . Gynecomastia, male 07/30/2012  . OSA (obstructive sleep apnea) 04/23/2011  . EXTERNAL HEMORRHOIDS 02/12/2011  . Prostate cancer ([place]) 11/12/2010  . PLANTAR FASCIITIS 04/09/2010  . Hypothyroidism 12/08/2009  .  NEOPLASM OF UNCERTAIN BEHAVIOR OF SKIN 03/10/2009  . OSTEOARTHRITIS 02/14/2009  . HEARING DEFICIT 02/08/2009  . Essential hypertension 02/08/2009  . ALLERGIC RHINITIS 02/08/2009  . GERD 02/08/2009  . PROTEINURIA 02/08/2009  . DIVERTICULITIS, HX OF 02/08/2009    Percival Spanish, PT, MPT 04/08/2017, 8:44 AM  [facility] [address]  [place], [place], [address] Phone: [phone]   Fax:  [phone]  Name: LEVAUGHN PUCCINELLI MRN: [id number] Date of Birth: [birth year]

## 2017-04-09 ENCOUNTER — Telehealth: Payer: Self-pay | Admitting: *Deleted

## 2017-04-09 NOTE — Telephone Encounter (Signed)
Call back: he reports no improvement with ABT. He has an appt with ENT next week. He will call back if he has any questions or concerns.

## 2017-04-09 NOTE — Telephone Encounter (Signed)
Encounter created to enter CBC Order and result not entered on DOS.

## 2017-04-10 ENCOUNTER — Ambulatory Visit: Payer: 59

## 2017-04-10 DIAGNOSIS — M6281 Muscle weakness (generalized): Secondary | ICD-10-CM | POA: Diagnosis not present

## 2017-04-10 DIAGNOSIS — R293 Abnormal posture: Secondary | ICD-10-CM

## 2017-04-10 DIAGNOSIS — M25611 Stiffness of right shoulder, not elsewhere classified: Secondary | ICD-10-CM | POA: Diagnosis not present

## 2017-04-10 DIAGNOSIS — M25511 Pain in right shoulder: Secondary | ICD-10-CM | POA: Diagnosis not present

## 2017-04-10 NOTE — Therapy (Signed)
[facility] [address]  [place], [place], [address] Phone: [phone]   Fax:  [phone]  Physical Therapy Treatment  Patient Details  Name: Zachary [name] MRN: [id number] Date of Birth: 01-26-1951 Referring Provider: Dr. Corky Mull  Encounter Date: 04/10/2017      PT End of Session - 04/10/17 0801    Visit Number 28   Number of Visits 38   Date for PT Re-Evaluation 05/09/17   PT Start Time 0759   PT Stop Time 0838   PT Time Calculation (min) 39 min   Activity Tolerance Patient tolerated treatment well   Behavior During Therapy [facility] for tasks assessed/performed      Past Medical History:  Diagnosis Date  . Arthritis   . Articular cartilage disease    left shoulder  . Cancer ([place]) 2015   positive prostate cancer bx  . Diverticulitis   . Dyslipidemia (high LDL; low HDL) 08/15/2016  . GERD (gastroesophageal reflux disease)   . Hearing problem    hearing deficit  . Hemorrhoids   . Hypertension   . Hypothyroidism   . Sleep apnea    uses a cpap  . Wears glasses   . Wears hearing aid    both ears    Past Surgical History:  Procedure Laterality Date  . BACK SURGERY  1978   herniated disksurgery  . CARDIAC CATHETERIZATION  08/01/2010  . COLONOSCOPY    . EXAM UNDER ANESTHESIA WITH MANIPULATION OF SHOULDER Right 09/15/2014   Procedure: RIGHT SHOULDER MANIPULATION UNDER ANESTHESIA;  Surgeon: Ninetta Lights, MD;  Location: [place];  Service: Orthopedics;  Laterality: Right;  . HEMORRHOID SURGERY  08/2006  . SHOULDER ARTHROSCOPY WITH DISTAL CLAVICLE RESECTION Left 07/07/2015   Procedure: SHOULDER ARTHROSCOPY WITH DISTAL CLAVICLE RESECTION;  Surgeon: Kathryne Hitch, MD;  Location: [place];  Service: Orthopedics;  Laterality: Left;  . SHOULDER ARTHROSCOPY WITH ROTATOR CUFF REPAIR Left 12/07/2015   Procedure: LEFT SHOULDER ARTHROSCOPY DEBRIDEMENT, WITH ROTATOR CUFF  REPAIR;  Surgeon: Ninetta Lights, MD;  Location: [place];  Service: Orthopedics;  Laterality: Left;  . SHOULDER ARTHROSCOPY WITH ROTATOR CUFF REPAIR AND SUBACROMIAL DECOMPRESSION Left 07/07/2015   Procedure: LEFT SHOULDER SCOPE DEBRIDEMENT, SUBACROMIAL DECOMPRESSION, DISTAL CLAVICULECTOMY, ROTATOR CUFF REPAIR  ;  Surgeon: Kathryne Hitch, MD;  Location: [place];  Service: Orthopedics;  Laterality: Left;  ANESTHESIA: GENERAL, PRE/POST OP SCALENE  . SHOULDER ARTHROSCOPY WITH SUBACROMIAL DECOMPRESSION, ROTATOR CUFF REPAIR AND BICEP TENDON REPAIR Right 03/10/2014   Procedure: RIGHT SHOULDER ARTHROSCOPY WITH SUBACROMIAL DECOMPRESSION, PARTIAL ACROMIOPLASTY WITH CORACOAROMIAL LABRUM DEBRIDEMENT RELEASE DISTAL CLAVICULECTOMY,  ROTATOR CUFF REPAIR AND EXTENSIVE DEBRIDEMENT;  Surgeon: Ninetta Lights, MD;  Location: [place];  Service: Orthopedics;  Laterality: Right;  . TENDON REPAIR  June 06, 2011   right elbow, Dr. Percell Miller    There were no vitals filed for this visit.      Subjective Assessment - 04/10/17 0805    Subjective Pt. reporting stiffness and pain in R shoulder in mornings.  Pt. reporting R shoulder is still, "bothering" him and he is waking up a few times a night again with pain.   Patient Stated Goals "get back to work"    Currently in Pain? Yes   Pain Score 2    Pain Location Shoulder   Pain Descriptors / Indicators Sharp   Pain Type Surgical pain   Pain Onset More than a  month ago   Aggravating Factors  mornings    Pain Relieving Factors ice    Multiple Pain Sites No                         [facility] Adult PT Treatment/Exercise - 04/10/17 0808      Shoulder Exercises: Standing   External Rotation Right;15 reps;Theraband   Theraband Level (Shoulder External Rotation) Level 2 (Red)   External Rotation Limitations shoudler at 90 dg ABD   Internal Rotation Right;20 reps;Theraband   Theraband Level (Shoulder Internal  Rotation) Level 2 (Red)   Internal Rotation Limitations shoudler at 90 dg ABD   Row Both;15 reps   Row Limitations TRX low row  ~ 60 dg    Other Standing Exercises D1/D2 flexion/extension with red TB x 10 reps      Shoulder Exercises: ROM/Strengthening   UBE (Upper Arm Bike) lvl 4.5 fwd/back 3' each   Cybex Row 20 reps   Cybex Row Limitations 25#; 3" hold    Wall Pushups 15 reps   Pushups Limitations at counter ~ 60 dg    Rhythmic Stabilization, Supine Standing R med ball bounce (500Gr) 2 x 45 sec with shoulder in 90dg abduction position    Rhythmic Stabilization, Seated Standing - L arm in full flexion - flex <> ext oscillation with red TB x20 each   Other ROM/Strengthening Exercises BATCA Pulldown 25# x 20 reps;   pt. requested lower weight on this     Manual Therapy   Manual Therapy Soft tissue mobilization;Myofascial release;Passive ROM   Soft tissue mobilization R subscapularis & teres minor   Myofascial Release TPR R subscap; TTP here    Passive ROM R shoulder PROM flexion/abduction                   PT Short Term Goals - 01/23/17 1610      PT SHORT TERM GOAL #1   Title R shoulder PROM forward flexion to 140, ER at side to 40, & ABD without rotation to 80 by 01/24/17    Status Achieved     PT SHORT TERM GOAL #2   Title independent with basic HEP by 01/17/17   Status Achieved           PT Long Term Goals - 03/25/17 0808      PT LONG TERM GOAL #1   Title R shoulder AROM WFL by 05/09/17   Status On-going  met for gravity minimized AROM; nearly met for AROM against gravity     PT LONG TERM GOAL #2   Title pt is able to return to all ADLs, chores, and work duties without limitation by shoulder pain, LOM, or weakness by 05/09/17.   Status On-going     PT LONG TERM GOAL #3   Title pt able to sleep without limitation by shoulder pain by 05/09/17   Status On-going     PT LONG TERM GOAL #4   Title R shoulder MMT >/= to 4/5 by 05/09/17   Status On-going                Plan - 04/10/17 0826    Clinical Impression Statement Pt. reporting R shoulder still "bothering" him waking a few times a night with pain.  Treatment with progression of overhead strengthening activity with wall ball bounce overhead added today and continued proprioception activities.  Pt. pain remaining low with therex today.  Able to demo good technique with  overhead IR/ER today and tolerated addition of diagonal patterns without issue.  Resistance decreased slightly today upon pt. request as he feels this may have increased his soreness following last visit.  Pt. declining ice to end treatment and confirming appropriate fatigue.      PT Treatment/Interventions Patient/family education;ADLs/Self Care Home Management;Passive range of motion;Manual techniques;Therapeutic exercise;Therapeutic activities;Cryotherapy;Vasopneumatic Device;Moist Heat;Electrical Stimulation;Scar mobilization;Taping;Iontophoresis 71m/ml Dexamethasone   PT Next Visit Plan Gradually progress scapular stabilization & RTC strengthening per tolerance & protocol; Manual therapy for STM/MFR, PROM & joint mobs as indicated for pain and ROM; Modalities PRN      Patient will benefit from skilled therapeutic intervention in order to improve the following deficits and impairments:  Decreased range of motion, Decreased strength, Pain, Impaired UE functional use, Decreased activity tolerance, Decreased scar mobility, Postural dysfunction  Visit Diagnosis: Right shoulder pain, unspecified chronicity  Stiffness of right shoulder, not elsewhere classified  Abnormal posture  Muscle weakness (generalized)     Problem List Patient Active Problem List   Diagnosis Date Noted  . Dyslipidemia (high LDL; low HDL) 08/15/2016  . Complete rotator cuff tear of left shoulder 07/05/2015  . Tennis elbow 05/30/2014  . Rotator cuff (capsule) sprain 03/10/2014  . Right shoulder pain 02/11/2014  . Memory difficulty  12/18/2012  . Gynecomastia, male 07/30/2012  . OSA (obstructive sleep apnea) 04/23/2011  . EXTERNAL HEMORRHOIDS 02/12/2011  . [place]state cancer ([place] 11/12/2010  . PLANTAR FASCIITIS 04/09/2010  . Hypothyroidism 12/08/2009  . NEOPLASM OF UNCERTAIN BEHAVIOR OF SKIN 03/10/2009  . OSTEOARTHRITIS 02/14/2009  . HEARING DEFICIT 02/08/2009  . Essential hypertension 02/08/2009  . ALLERGIC RHINITIS 02/08/2009  . GERD 02/08/2009  . PROTEINURIA 02/08/2009  . DIVERTICULITIS, HX OF 02/08/2009    Zachary [name] PTA 04/10/17 12:10 PM  [facility] [address] [place] [address]Phone: [phone]  Fax:  [phone] Name: FTIN ENGRAMMRN: [id number] of Birth: 1Mar 19, 1952

## 2017-04-15 ENCOUNTER — Ambulatory Visit: Payer: 59

## 2017-04-15 DIAGNOSIS — M25611 Stiffness of right shoulder, not elsewhere classified: Secondary | ICD-10-CM | POA: Diagnosis not present

## 2017-04-15 DIAGNOSIS — M25511 Pain in right shoulder: Secondary | ICD-10-CM

## 2017-04-15 DIAGNOSIS — M6281 Muscle weakness (generalized): Secondary | ICD-10-CM | POA: Diagnosis not present

## 2017-04-15 DIAGNOSIS — R293 Abnormal posture: Secondary | ICD-10-CM

## 2017-04-15 NOTE — Therapy (Signed)
[facility] [address]  [place], [place], [address] Phone: [phone]   Fax:  [phone]  Physical Therapy Treatment  Patient Details  Name: Zachary [name] MRN: [id number] Date of Birth: October 05, 1951 Referring Provider: Dr. Corky Mull  Encounter Date: 04/15/2017      PT End of Session - 04/15/17 0803    Visit Number 29   Number of Visits 38   Date for PT Re-Evaluation 05/09/17   PT Start Time 0759   PT Stop Time 0844   PT Time Calculation (min) 45 min   Activity Tolerance Patient tolerated treatment well   Behavior During Therapy [facility] for tasks assessed/performed      Past Medical History:  Diagnosis Date  . Arthritis   . Articular cartilage disease    left shoulder  . Cancer ([place]) 2015   positive prostate cancer bx  . Diverticulitis   . Dyslipidemia (high LDL; low HDL) 08/15/2016  . GERD (gastroesophageal reflux disease)   . Hearing problem    hearing deficit  . Hemorrhoids   . Hypertension   . Hypothyroidism   . Sleep apnea    uses a cpap  . Wears glasses   . Wears hearing aid    both ears    Past Surgical History:  Procedure Laterality Date  . BACK SURGERY  1978   herniated disksurgery  . CARDIAC CATHETERIZATION  08/01/2010  . COLONOSCOPY    . EXAM UNDER ANESTHESIA WITH MANIPULATION OF SHOULDER Right 09/15/2014   Procedure: RIGHT SHOULDER MANIPULATION UNDER ANESTHESIA;  Surgeon: Ninetta Lights, MD;  Location: [place];  Service: Orthopedics;  Laterality: Right;  . HEMORRHOID SURGERY  08/2006  . SHOULDER ARTHROSCOPY WITH DISTAL CLAVICLE RESECTION Left 07/07/2015   Procedure: SHOULDER ARTHROSCOPY WITH DISTAL CLAVICLE RESECTION;  Surgeon: Kathryne Hitch, MD;  Location: [place];  Service: Orthopedics;  Laterality: Left;  . SHOULDER ARTHROSCOPY WITH ROTATOR CUFF REPAIR Left 12/07/2015   Procedure: LEFT SHOULDER ARTHROSCOPY DEBRIDEMENT, WITH ROTATOR CUFF  REPAIR;  Surgeon: Ninetta Lights, MD;  Location: [place];  Service: Orthopedics;  Laterality: Left;  . SHOULDER ARTHROSCOPY WITH ROTATOR CUFF REPAIR AND SUBACROMIAL DECOMPRESSION Left 07/07/2015   Procedure: LEFT SHOULDER SCOPE DEBRIDEMENT, SUBACROMIAL DECOMPRESSION, DISTAL CLAVICULECTOMY, ROTATOR CUFF REPAIR  ;  Surgeon: Kathryne Hitch, MD;  Location: [place];  Service: Orthopedics;  Laterality: Left;  ANESTHESIA: GENERAL, PRE/POST OP SCALENE  . SHOULDER ARTHROSCOPY WITH SUBACROMIAL DECOMPRESSION, ROTATOR CUFF REPAIR AND BICEP TENDON REPAIR Right 03/10/2014   Procedure: RIGHT SHOULDER ARTHROSCOPY WITH SUBACROMIAL DECOMPRESSION, PARTIAL ACROMIOPLASTY WITH CORACOAROMIAL LABRUM DEBRIDEMENT RELEASE DISTAL CLAVICULECTOMY,  ROTATOR CUFF REPAIR AND EXTENSIVE DEBRIDEMENT;  Surgeon: Ninetta Lights, MD;  Location: [place];  Service: Orthopedics;  Laterality: Right;  . TENDON REPAIR  June 06, 2011   right elbow, Dr. Percell Miller    There were no vitals filed for this visit.      Subjective Assessment - 04/15/17 0801    Subjective Pt. reporting he is still waking with R shoulder pain at nights.     Patient Stated Goals "get back to work"    Currently in Pain? Yes   Pain Score 1    Pain Location Shoulder   Pain Orientation Right   Pain Descriptors / Indicators Sharp   Pain Type Surgical pain   Pain Onset More than a month ago   Pain Frequency Intermittent   Aggravating Factors  Nights  sleeping on R side    Multiple Pain Sites No                         [facility] Adult PT Treatment/Exercise - 04/15/17 0806      Shoulder Exercises: Standing   External Rotation Right;15 reps;Theraband  appropriate fatigue confirmed    Theraband Level (Shoulder External Rotation) Level 2 (Red)   External Rotation Limitations shoudler at 90 dg ABD   Internal Rotation Right;20 reps;Theraband  appropriate fatigue confirmed    Theraband Level (Shoulder  Internal Rotation) Level 2 (Red)   Flexion Right;15 reps;Theraband   Theraband Level (Shoulder Flexion) Level 2 (Red)  red TB due to pt. difficulty preventing shoulder hiking    ABduction Right;Theraband;15 reps   Theraband Level (Shoulder ABduction) Level 2 (Red)  red TB due to pt. difficulty with shoulder hiking    ABduction Limitations slight scaption   Row Both;15 reps   Row Limitations TRX; second set with blue TB    Other Standing Exercises D1/D2 flexion/extension with red TB x 15 reps      Shoulder Exercises: ROM/Strengthening   UBE (Upper Arm Bike) lvl 5.0 fwd/back 3' each   Cybex Row 15 reps   Cybex Row Limitations 35#; 3" hold    Wall Pushups 10 reps   Pushups Limitations at counter ~ 50 dg    Rhythmic Stabilization, Supine Standing R med ball bounce (1000Gr) 2 x 30 sec with shoulder in 90dg abduction position    Other ROM/Strengthening Exercises BATCA Pulldown 35# 2 x 15 reps                PT Education - 04/15/17 0847    Education provided Yes   Education Details Standing shoulder flexion/abduction to above shoulder height standing on yellow TB,  row, row/extension with green TB issued to pt.    Person(s) Educated Patient   Methods Explanation;Demonstration;Verbal cues;Handout   Comprehension Verbalized understanding;Returned demonstration;Verbal cues required;Need further instruction          PT Short Term Goals - 01/23/17 0951      PT SHORT TERM GOAL #1   Title R shoulder PROM forward flexion to 140, ER at side to 40, & ABD without rotation to 80 by 01/24/17    Status Achieved     PT SHORT TERM GOAL #2   Title independent with basic HEP by 01/17/17   Status Achieved           PT Long Term Goals - 03/25/17 0808      PT LONG TERM GOAL #1   Title R shoulder AROM WFL by 05/09/17   Status On-going  met for gravity minimized AROM; nearly met for AROM against gravity     PT LONG TERM GOAL #2   Title pt is able to return to all ADLs, chores, and work  duties without limitation by shoulder pain, LOM, or weakness by 05/09/17.   Status On-going     PT LONG TERM GOAL #3   Title pt able to sleep without limitation by shoulder pain by 05/09/17   Status On-going     PT LONG TERM GOAL #4   Title R shoulder MMT >/= to 4/5 by 05/09/17   Status On-going               Plan - 04/15/17 1049    Clinical Impression Statement Belvin doing well with progression of scapular and RTC strengthening activity today.  Still waking  with R shoulder pain at night occasionally.  Overhead activities progressed today.  HEP updated with pt. able to tolerate all activities well.  Pt. will continue to from further skilled therapy to maximize UE functional strength, and ability to perform daily tasks.     PT Treatment/Interventions Patient/family education;ADLs/Self Care Home Management;Passive range of motion;Manual techniques;Therapeutic exercise;Therapeutic activities;Cryotherapy;Vasopneumatic Device;Moist Heat;Electrical Stimulation;Scar mobilization;Taping;Iontophoresis 80m/ml Dexamethasone   PT Next Visit Plan Gradually progress scapular stabilization & RTC strengthening per tolerance & protocol; Manual therapy for STM/MFR, PROM & joint mobs as indicated for pain and ROM; Modalities PRN      Patient will benefit from skilled therapeutic intervention in order to improve the following deficits and impairments:  Decreased range of motion, Decreased strength, Pain, Impaired UE functional use, Decreased activity tolerance, Decreased scar mobility, Postural dysfunction  Visit Diagnosis: Right shoulder pain, unspecified chronicity  Stiffness of right shoulder, not elsewhere classified  Abnormal posture  Muscle weakness (generalized)     Problem List Patient Active Problem List   Diagnosis Date Noted  . Dyslipidemia (high LDL; low HDL) 08/15/2016  . Complete rotator cuff tear of left shoulder 07/05/2015  . Tennis elbow 05/30/2014  . Rotator cuff (capsule)  sprain 03/10/2014  . Right shoulder pain 02/11/2014  . Memory difficulty 12/18/2012  . Gynecomastia, male 07/30/2012  . OSA (obstructive sleep apnea) 04/23/2011  . EXTERNAL HEMORRHOIDS 02/12/2011  . Prostate cancer ([place] 11/12/2010  . PLANTAR FASCIITIS 04/09/2010  . Hypothyroidism 12/08/2009  . NEOPLASM OF UNCERTAIN BEHAVIOR OF SKIN 03/10/2009  . OSTEOARTHRITIS 02/14/2009  . HEARING DEFICIT 02/08/2009  . Essential hypertension 02/08/2009  . ALLERGIC RHINITIS 02/08/2009  . GERD 02/08/2009  . PROTEINURIA 02/08/2009  . DIVERTICULITIS, HX OF 02/08/2009    MBess Harvest PTA 04/15/17 10:58 AM  [facility] [place] [address]Phone: [phone]  Fax:  [phone] Name: FKRON EVERTONMRN: [id number] of Birth: [birth year]

## 2017-04-16 DIAGNOSIS — C61 Malignant neoplasm of prostate: Secondary | ICD-10-CM | POA: Diagnosis not present

## 2017-04-17 ENCOUNTER — Ambulatory Visit: Payer: 59 | Admitting: Physical Therapy

## 2017-04-17 ENCOUNTER — Encounter: Payer: Self-pay | Admitting: Family Medicine

## 2017-04-17 ENCOUNTER — Other Ambulatory Visit (HOSPITAL_COMMUNITY): Payer: Self-pay | Admitting: Urology

## 2017-04-17 DIAGNOSIS — Z7289 Other problems related to lifestyle: Secondary | ICD-10-CM | POA: Diagnosis not present

## 2017-04-17 DIAGNOSIS — K121 Other forms of stomatitis: Secondary | ICD-10-CM | POA: Diagnosis not present

## 2017-04-17 DIAGNOSIS — C61 Malignant neoplasm of prostate: Secondary | ICD-10-CM

## 2017-04-22 ENCOUNTER — Ambulatory Visit: Payer: 59 | Attending: Sports Medicine

## 2017-04-22 DIAGNOSIS — M25611 Stiffness of right shoulder, not elsewhere classified: Secondary | ICD-10-CM | POA: Insufficient documentation

## 2017-04-22 DIAGNOSIS — M6281 Muscle weakness (generalized): Secondary | ICD-10-CM | POA: Insufficient documentation

## 2017-04-22 DIAGNOSIS — G4733 Obstructive sleep apnea (adult) (pediatric): Secondary | ICD-10-CM | POA: Diagnosis not present

## 2017-04-22 DIAGNOSIS — R293 Abnormal posture: Secondary | ICD-10-CM | POA: Insufficient documentation

## 2017-04-22 DIAGNOSIS — M25511 Pain in right shoulder: Secondary | ICD-10-CM | POA: Insufficient documentation

## 2017-04-22 NOTE — Therapy (Signed)
[facility] [address]  [place], [place], [address] Phone: [phone]   Fax:  [phone]  Physical Therapy Treatment  Patient Details  Name: Zachary [name] MRN: [id number] Date of Birth: 12/23/1951 Referring Provider: Dr. Corky Mull  Encounter Date: 04/22/2017      PT End of Session - 04/22/17 0802    Visit Number 30   Number of Visits 38   Date for PT Re-Evaluation 05/09/17   PT Start Time 0757   PT Stop Time 0845   PT Time Calculation (min) 48 min   Activity Tolerance Patient tolerated treatment well   Behavior During Therapy [facility] for tasks assessed/performed      Past Medical History:  Diagnosis Date  . Arthritis   . Articular cartilage disease    left shoulder  . Cancer ([place]) 2015   positive prostate cancer bx  . Diverticulitis   . Dyslipidemia (high LDL; low HDL) 08/15/2016  . GERD (gastroesophageal reflux disease)   . Hearing problem    hearing deficit  . Hemorrhoids   . Hypertension   . Hypothyroidism   . Sleep apnea    uses a cpap  . Wears glasses   . Wears hearing aid    both ears    Past Surgical History:  Procedure Laterality Date  . BACK SURGERY  1978   herniated disksurgery  . CARDIAC CATHETERIZATION  08/01/2010  . COLONOSCOPY    . EXAM UNDER ANESTHESIA WITH MANIPULATION OF SHOULDER Right 09/15/2014   Procedure: RIGHT SHOULDER MANIPULATION UNDER ANESTHESIA;  Surgeon: Ninetta Lights, MD;  Location: [place];  Service: Orthopedics;  Laterality: Right;  . HEMORRHOID SURGERY  08/2006  . SHOULDER ARTHROSCOPY WITH DISTAL CLAVICLE RESECTION Left 07/07/2015   Procedure: SHOULDER ARTHROSCOPY WITH DISTAL CLAVICLE RESECTION;  Surgeon: Kathryne Hitch, MD;  Location: [place];  Service: Orthopedics;  Laterality: Left;  . SHOULDER ARTHROSCOPY WITH ROTATOR CUFF REPAIR Left 12/07/2015   Procedure: LEFT SHOULDER ARTHROSCOPY DEBRIDEMENT, WITH ROTATOR CUFF  REPAIR;  Surgeon: Ninetta Lights, MD;  Location: [place];  Service: Orthopedics;  Laterality: Left;  . SHOULDER ARTHROSCOPY WITH ROTATOR CUFF REPAIR AND SUBACROMIAL DECOMPRESSION Left 07/07/2015   Procedure: LEFT SHOULDER SCOPE DEBRIDEMENT, SUBACROMIAL DECOMPRESSION, DISTAL CLAVICULECTOMY, ROTATOR CUFF REPAIR  ;  Surgeon: Kathryne Hitch, MD;  Location: [place];  Service: Orthopedics;  Laterality: Left;  ANESTHESIA: GENERAL, PRE/POST OP SCALENE  . SHOULDER ARTHROSCOPY WITH SUBACROMIAL DECOMPRESSION, ROTATOR CUFF REPAIR AND BICEP TENDON REPAIR Right 03/10/2014   Procedure: RIGHT SHOULDER ARTHROSCOPY WITH SUBACROMIAL DECOMPRESSION, PARTIAL ACROMIOPLASTY WITH CORACOAROMIAL LABRUM DEBRIDEMENT RELEASE DISTAL CLAVICULECTOMY,  ROTATOR CUFF REPAIR AND EXTENSIVE DEBRIDEMENT;  Surgeon: Ninetta Lights, MD;  Location: [place];  Service: Orthopedics;  Laterality: Right;  . TENDON REPAIR  June 06, 2011   right elbow, Dr. Percell Miller    There were no vitals filed for this visit.      Subjective Assessment - 04/22/17 0756    Subjective Pt. reports HEP is getting easier and less pain with reaching activities now.     Patient Stated Goals "get back to work"    Currently in Pain? Yes   Pain Score 1    Pain Location Shoulder   Pain Orientation Right   Pain Descriptors / Indicators Sharp   Pain Type Surgical pain   Pain Onset More than a month ago   Pain Frequency Intermittent   Aggravating Factors  nights sleeping on R side    Multiple Pain Sites No            [facility] PT Assessment - 04/22/17 0818      Observation/Other Assessments   Focus on Therapeutic Outcomes (FOTO)  61% (39% limitation)     AROM   Right Shoulder Flexion 175 Degrees   Right Shoulder ABduction 170 Degrees   Right Shoulder Internal Rotation 87 Degrees   Right Shoulder External Rotation 91 Degrees     PROM   Right Shoulder Flexion 175 Degrees   Right Shoulder ABduction 178  Degrees   Right Shoulder Internal Rotation 89 Degrees   Right Shoulder External Rotation 91 Degrees     Strength   Strength Assessment Site Shoulder   Right/Left Shoulder Right;Left   Right Shoulder Flexion 4/5   Right Shoulder ABduction 4-/5  pain    Right Shoulder Internal Rotation 4+/5   Right Shoulder External Rotation 4-/5                     [facility] Adult PT Treatment/Exercise - 04/22/17 0811      Shoulder Exercises: Standing   Horizontal ABduction Both;15 reps;Theraband   Theraband Level (Shoulder Horizontal ABduction) Level 3 (Green)   External Rotation Right;Theraband;20 reps   Theraband Level (Shoulder External Rotation) Level 2 (Red)   External Rotation Limitations Overhead    Internal Rotation Right;20 reps;Theraband   Theraband Level (Shoulder Internal Rotation) Level 2 (Red)   Internal Rotation Limitations Overhead    Flexion Right;15 reps;Theraband   Theraband Level (Shoulder Flexion) Level 2 (Red)   ABduction Right;Theraband;15 reps   Theraband Level (Shoulder ABduction) Level 2 (Red)   ABduction Limitations slight scaption   Row Both;15 reps   Row Limitations TRX cable     Shoulder Exercises: Pulleys   Flexion 1 minute   ABduction 1 minute   ABduction Limitations abduction      Shoulder Exercises: ROM/Strengthening   UBE (Upper Arm Bike) lvl 5.0 fwd/back 3' each   Cybex Row 15 reps   Cybex Row Limitations 45#; 3" hold    Wall Pushups 15 reps   Pushups Limitations at counter ~ 50 dg    Rhythmic Stabilization, Supine Standing R med ball bounce (1000Gr) in overhead abduction, and ER overhead x 45 dsec each way    Other ROM/Strengthening Exercises BATCA Pulldown 45# x 15 reps   Other ROM/Strengthening Exercises BATCA pushup plus 35# x 20 reps                  PT Short Term Goals - 01/23/17 0951      PT SHORT TERM GOAL #1   Title R shoulder PROM forward flexion to 140, ER at side to 40, & ABD without rotation to 80 by 01/24/17     Status Achieved     PT SHORT TERM GOAL #2   Title independent with basic HEP by 01/17/17   Status Achieved           PT Long Term Goals - 04/22/17 0807      PT LONG TERM GOAL #1   Title R shoulder AROM WFL by 05/09/17   Status Achieved  5.1.18: met for FULL gravity AROM      PT LONG TERM GOAL #2   Title pt is able to return to all ADLs, chores, and work duties without limitation by shoulder pain, LOM, or weakness by 05/09/17.   Status Achieved  5.1.18: No limitation  PT LONG TERM GOAL #3   Title pt able to sleep without limitation by shoulder pain by 05/09/17   Status On-going  5.1.18: still waking 2-3x/night with pain while sleeping on R side      PT LONG TERM GOAL #4   Title R shoulder MMT >/= to 4/5 by 05/09/17   Status Partially Met  5.1.18: still 4-/5 with R shoulder abd, ER with slight pain on resistance                Plan - 04/22/17 0806    Clinical Impression Statement Pt. doing well today however still waking 2-3x/night with R shoulder pain when laying on R side in bed.  AROM showing good improvement today with flexion 175 dg, abd 170 dg, IR 87 dg, ER 91 dg.  Pt. has now achieved R shoulder AROM with motion WFL.  Pt. noting less pain with reaching and overhead activities and is no longer limited with work duties or ADLS/household chores by R shoulder pain.  Strength improved today with R shoulder flexion 4/5, IR 4+/5, however still 4-/5 and slight pain with resistance in abduction and ER motions.  Pt. progressing well and reports no issues with updated HEP.  Flexion, scaption HEP activities getting easier per pt. report thus band resistance updated from yellow > red TB.  Pt. will continue to benefit from further skilled therapy to target remaining strength deficits to improve functional strength, and decrease pain with functional activities.     PT Treatment/Interventions Patient/family education;ADLs/Self Care Home Management;Passive range of motion;Manual  techniques;Therapeutic exercise;Therapeutic activities;Cryotherapy;Vasopneumatic Device;Moist Heat;Electrical Stimulation;Scar mobilization;Taping;Iontophoresis 46m/ml Dexamethasone   PT Next Visit Plan Progress overhead strengthening per pt. tolerance & protocol; Manual therapy for STM/MFR, PROM & joint mobs as indicated for pain and ROM; Modalities PRN      Patient will benefit from skilled therapeutic intervention in order to improve the following deficits and impairments:  Decreased range of motion, Decreased strength, Pain, Impaired UE functional use, Decreased activity tolerance, Decreased scar mobility, Postural dysfunction  Visit Diagnosis: Right shoulder pain, unspecified chronicity  Stiffness of right shoulder, not elsewhere classified  Abnormal posture  Muscle weakness (generalized)     Problem List Patient Active Problem List   Diagnosis Date Noted  . Dyslipidemia (high LDL; low HDL) 08/15/2016  . Complete rotator cuff tear of left shoulder 07/05/2015  . Tennis elbow 05/30/2014  . Rotator cuff (capsule) sprain 03/10/2014  . Right shoulder pain 02/11/2014  . Memory difficulty 12/18/2012  . Gynecomastia, male 07/30/2012  . OSA (obstructive sleep apnea) 04/23/2011  . EXTERNAL HEMORRHOIDS 02/12/2011  . [place]  . PLANTAR FASCIITIS 04/09/2010  . Hypothyroidism 12/08/2009  . NEOPLASM OF UNCERTAIN BEHAVIOR OF SKIN 03/10/2009  . OSTEOARTHRITIS 02/14/2009  . HEARING DEFICIT 02/08/2009  . Essential hypertension 02/08/2009  . ALLERGIC RHINITIS 02/08/2009  . GERD 02/08/2009  . PROTEINURIA 02/08/2009  . DIVERTICULITIS, HX OF 02/08/2009    MBess Harvest PTA 04/22/17 12:33 PM   [facility] [address] [place] [address]Phone: [phone]  Fax:  [phone] Name: Zachary PELLEGRINMRN: [id number] of Birth: [birth year]

## 2017-04-24 ENCOUNTER — Ambulatory Visit: Payer: 59 | Admitting: Physical Therapy

## 2017-04-24 DIAGNOSIS — M25611 Stiffness of right shoulder, not elsewhere classified: Secondary | ICD-10-CM

## 2017-04-24 DIAGNOSIS — M25511 Pain in right shoulder: Secondary | ICD-10-CM

## 2017-04-24 DIAGNOSIS — R293 Abnormal posture: Secondary | ICD-10-CM

## 2017-04-24 DIAGNOSIS — M6281 Muscle weakness (generalized): Secondary | ICD-10-CM

## 2017-04-24 NOTE — Therapy (Signed)
[facility] [address]  [place], [place], [address] Phone: [phone]   Fax:  [phone]  Physical Therapy Treatment  Patient Details  Name: Zachary [name] MRN: [id number] Date of Birth: Sep 06, 1951 Referring Provider: Dr. Corky Mull  Encounter Date: 04/24/2017      PT End of Session - 04/24/17 0800    Visit Number 31   Number of Visits 38   Date for PT Re-Evaluation 05/09/17   PT Start Time 0800   PT Stop Time 0841   PT Time Calculation (min) 41 min   Activity Tolerance Patient tolerated treatment well   Behavior During Therapy [facility] for tasks assessed/performed      Past Medical History:  Diagnosis Date  . Arthritis   . Articular cartilage disease    left shoulder  . Cancer ([place]) 2015   positive prostate cancer bx  . Diverticulitis   . Dyslipidemia (high LDL; low HDL) 08/15/2016  . GERD (gastroesophageal reflux disease)   . Hearing problem    hearing deficit  . Hemorrhoids   . Hypertension   . Hypothyroidism   . Sleep apnea    uses a cpap  . Wears glasses   . Wears hearing aid    both ears    Past Surgical History:  Procedure Laterality Date  . BACK SURGERY  1978   herniated disksurgery  . CARDIAC CATHETERIZATION  08/01/2010  . COLONOSCOPY    . EXAM UNDER ANESTHESIA WITH MANIPULATION OF SHOULDER Right 09/15/2014   Procedure: RIGHT SHOULDER MANIPULATION UNDER ANESTHESIA;  Surgeon: Ninetta Lights, MD;  Location: [place];  Service: Orthopedics;  Laterality: Right;  . HEMORRHOID SURGERY  08/2006  . SHOULDER ARTHROSCOPY WITH DISTAL CLAVICLE RESECTION Left 07/07/2015   Procedure: SHOULDER ARTHROSCOPY WITH DISTAL CLAVICLE RESECTION;  Surgeon: Kathryne Hitch, MD;  Location: [place];  Service: Orthopedics;  Laterality: Left;  . SHOULDER ARTHROSCOPY WITH ROTATOR CUFF REPAIR Left 12/07/2015   Procedure: LEFT SHOULDER ARTHROSCOPY DEBRIDEMENT, WITH ROTATOR CUFF  REPAIR;  Surgeon: Ninetta Lights, MD;  Location: [place];  Service: Orthopedics;  Laterality: Left;  . SHOULDER ARTHROSCOPY WITH ROTATOR CUFF REPAIR AND SUBACROMIAL DECOMPRESSION Left 07/07/2015   Procedure: LEFT SHOULDER SCOPE DEBRIDEMENT, SUBACROMIAL DECOMPRESSION, DISTAL CLAVICULECTOMY, ROTATOR CUFF REPAIR  ;  Surgeon: Kathryne Hitch, MD;  Location: [place];  Service: Orthopedics;  Laterality: Left;  ANESTHESIA: GENERAL, PRE/POST OP SCALENE  . SHOULDER ARTHROSCOPY WITH SUBACROMIAL DECOMPRESSION, ROTATOR CUFF REPAIR AND BICEP TENDON REPAIR Right 03/10/2014   Procedure: RIGHT SHOULDER ARTHROSCOPY WITH SUBACROMIAL DECOMPRESSION, PARTIAL ACROMIOPLASTY WITH CORACOAROMIAL LABRUM DEBRIDEMENT RELEASE DISTAL CLAVICULECTOMY,  ROTATOR CUFF REPAIR AND EXTENSIVE DEBRIDEMENT;  Surgeon: Ninetta Lights, MD;  Location: [place];  Service: Orthopedics;  Laterality: Right;  . TENDON REPAIR  June 06, 2011   right elbow, Dr. Percell Miller    There were no vitals filed for this visit.      Subjective Assessment - 04/24/17 0802    Subjective Pt noting more tightness this morning, but no pain.   Patient Stated Goals "get back to work"    Currently in Pain? No/denies   Pain Onset More than a month ago                         [facility] Adult PT Treatment/Exercise - 04/24/17 0800      Shoulder Exercises: Prone   Other Prone Exercises Alt  protraction rock on BOSU (lower body supported on mat table) x15     Shoulder Exercises: Standing   Row Both;15 reps  2 sets   Row Limitations TRX low & mid rows   Other Standing Exercises D1 flexion/extension & D2 extension with green TB x15, D2 flexion with red TB x15     Shoulder Exercises: Therapy Ball   Other Therapy Ball Exercises R UE rebounder toss with green (500 Gr) med ball 3x10 (low, shoulder height, overhead)     Shoulder Exercises: ROM/Strengthening   UBE (Upper Arm Bike) lvl 5.0 fwd/back 3' each    Cybex Press 20 reps   Cybex Press Limitations 35#, chest press +   Wall Pushups 15 reps   Pushups Limitations on green Pball   Plank --   Plank Limitations --   Side Plank 2 reps   Side Plank Limitations 20" x1, 15" x1   Ball on Wall Shoulder circles with yellow (2000 Gr) med ball at ~110 dg flexion, ABD in scapular plan CW/CCW 3# cuff wt x15 each   Other ROM/Strengthening Exercises BATCA Pulldown 45# x15   Other ROM/Strengthening Exercises BATCA single arm upward row 15# x15                  PT Short Term Goals - 01/23/17 8676      PT SHORT TERM GOAL #1   Title R shoulder PROM forward flexion to 140, ER at side to 40, & ABD without rotation to 80 by 01/24/17    Status Achieved     PT SHORT TERM GOAL #2   Title independent with basic HEP by 01/17/17   Status Achieved           PT Long Term Goals - 04/24/17 0848      PT LONG TERM GOAL #1   Title R shoulder AROM WFL by 05/09/17   Status Achieved     PT LONG TERM GOAL #2   Title pt is able to return to all ADLs, chores, and work duties without limitation by shoulder pain, LOM, or weakness by 05/09/17.   Status Achieved     PT LONG TERM GOAL #3   Title pt able to sleep without limitation by shoulder pain by 05/09/17   Status On-going  5.1.18: still waking 2-3x/night with pain while sleeping on R side      PT LONG TERM GOAL #4   Title R shoulder MMT >/= to 4/5 by 05/09/17   Status Partially Met  5.1.18: still 4-/5 with R shoulder abd, ER with slight pain on resistance                Plan - 04/24/17 0844    Clinical Impression Statement Pt continuing to demonstrate consistent progress with PT with full AROM restored and improving strength including in overhead ranges with greatest weakness remaining in ER. Pt continues to demonstrate more limited control with dynamic strengthening and functional activities, fatiguing more quickly with these activities.   Rehab Potential Good   Clinical Impairments Affecting  Rehab Potential history of multiple bilateral RTC surgeries; history of R elbow surgery   PT Treatment/Interventions Patient/family education;ADLs/Self Care Home Management;Passive range of motion;Manual techniques;Therapeutic exercise;Therapeutic activities;Cryotherapy;Vasopneumatic Device;Moist Heat;Electrical Stimulation;Scar mobilization;Taping;Iontophoresis 18m/ml Dexamethasone   PT Next Visit Plan Progress overhead strengthening per pt. tolerance & protocol; Manual therapy for STM/MFR, PROM & joint mobs as indicated for pain and ROM; Modalities PRN   Consulted and Agree with Plan of Care Patient  Patient will benefit from skilled therapeutic intervention in order to improve the following deficits and impairments:  Decreased range of motion, Decreased strength, Pain, Impaired UE functional use, Decreased activity tolerance, Decreased scar mobility, Postural dysfunction  Visit Diagnosis: Right shoulder pain, unspecified chronicity  Stiffness of right shoulder, not elsewhere classified  Abnormal posture  Muscle weakness (generalized)     Problem List Patient Active Problem List   Diagnosis Date Noted  . Dyslipidemia (high LDL; low HDL) 08/15/2016  . Complete rotator cuff tear of left shoulder 07/05/2015  . Tennis elbow 05/30/2014  . Rotator cuff (capsule) sprain 03/10/2014  . Right shoulder pain 02/11/2014  . Memory difficulty 12/18/2012  . Gynecomastia, male 07/30/2012  . OSA (obstructive sleep apnea) 04/23/2011  . EXTERNAL HEMORRHOIDS 02/12/2011  . Prostate cancer ([place]) 11/12/2010  . PLANTAR FASCIITIS 04/09/2010  . Hypothyroidism 12/08/2009  . NEOPLASM OF UNCERTAIN BEHAVIOR OF SKIN 03/10/2009  . OSTEOARTHRITIS 02/14/2009  . HEARING DEFICIT 02/08/2009  . Essential hypertension 02/08/2009  . ALLERGIC RHINITIS 02/08/2009  . GERD 02/08/2009  . PROTEINURIA 02/08/2009  . DIVERTICULITIS, HX OF 02/08/2009    Percival Spanish, PT, MPT 04/24/2017, 8:53 AM  [facility] [address]  [place], [place], [address] Phone: [phone]   Fax:  [phone]  Name: TATSUO MUSIAL MRN: [id number] Date of Birth: 1951-08-28

## 2017-04-28 MED FILL — CMP-QDRL/LIDO/MYLAN/NYS1111: 3 days supply | Qty: 120 | Fill #1

## 2017-04-28 MED FILL — OMEPRAZOLE DR 40 MG CAPSULE: 40 | 30 days supply | Qty: 30 | Fill #2

## 2017-04-29 ENCOUNTER — Ambulatory Visit: Payer: 59

## 2017-04-29 DIAGNOSIS — M6281 Muscle weakness (generalized): Secondary | ICD-10-CM | POA: Diagnosis not present

## 2017-04-29 DIAGNOSIS — M25611 Stiffness of right shoulder, not elsewhere classified: Secondary | ICD-10-CM | POA: Diagnosis not present

## 2017-04-29 DIAGNOSIS — M25511 Pain in right shoulder: Secondary | ICD-10-CM

## 2017-04-29 DIAGNOSIS — R293 Abnormal posture: Secondary | ICD-10-CM | POA: Diagnosis not present

## 2017-04-29 NOTE — Therapy (Signed)
[facility] [place] [address]  [place], [place], [address] Phone: [phone]   Fax:  [phone]  Physical Therapy Treatment  Patient Details  Name: Zachary [name] MRN: [id number] Date of Birth: 1951/04/04 Referring Provider: Dr. Corky Mull  Encounter Date: 04/29/2017      PT End of Session - 04/29/17 0808    Visit Number 32   Number of Visits 38   Date for PT Re-Evaluation 05/09/17   PT Start Time 0758   PT Stop Time 0840   PT Time Calculation (min) 42 min   Activity Tolerance Patient tolerated treatment well   Behavior During Therapy [facility] for tasks assessed/performed      Past Medical History:  Diagnosis Date  . Arthritis   . Articular cartilage disease    left shoulder  . Cancer ([place]) 2015   positive prostate cancer bx  . Diverticulitis   . Dyslipidemia (high LDL; low HDL) 08/15/2016  . GERD (gastroesophageal reflux disease)   . Hearing problem    hearing deficit  . Hemorrhoids   . Hypertension   . Hypothyroidism   . Sleep apnea    uses a cpap  . Wears glasses   . Wears hearing aid    both ears    Past Surgical History:  Procedure Laterality Date  . BACK SURGERY  1978   herniated disksurgery  . CARDIAC CATHETERIZATION  08/01/2010  . COLONOSCOPY    . EXAM UNDER ANESTHESIA WITH MANIPULATION OF SHOULDER Right 09/15/2014   Procedure: RIGHT SHOULDER MANIPULATION UNDER ANESTHESIA;  Surgeon: Ninetta Lights, MD;  Location: [place];  Service: Orthopedics;  Laterality: Right;  . HEMORRHOID SURGERY  08/2006  . SHOULDER ARTHROSCOPY WITH DISTAL CLAVICLE RESECTION Left 07/07/2015   Procedure: SHOULDER ARTHROSCOPY WITH DISTAL CLAVICLE RESECTION;  Surgeon: Kathryne Hitch, MD;  Location: [place];  Service: Orthopedics;  Laterality: Left;  . SHOULDER ARTHROSCOPY WITH ROTATOR CUFF REPAIR Left 12/07/2015   Procedure: LEFT SHOULDER ARTHROSCOPY DEBRIDEMENT, WITH ROTATOR CUFF  REPAIR;  Surgeon: Ninetta Lights, MD;  Location: [place];  Service: Orthopedics;  Laterality: Left;  . SHOULDER ARTHROSCOPY WITH ROTATOR CUFF REPAIR AND SUBACROMIAL DECOMPRESSION Left 07/07/2015   Procedure: LEFT SHOULDER SCOPE DEBRIDEMENT, SUBACROMIAL DECOMPRESSION, DISTAL CLAVICULECTOMY, ROTATOR CUFF REPAIR  ;  Surgeon: Kathryne Hitch, MD;  Location: [facility];  Service: Orthopedics;  Laterality: Left;  ANESTHESIA: GENERAL, PRE/POST OP SCALENE  . SHOULDER ARTHROSCOPY WITH SUBACROMIAL DECOMPRESSION, ROTATOR CUFF REPAIR AND BICEP TENDON REPAIR Right 03/10/2014   Procedure: RIGHT SHOULDER ARTHROSCOPY WITH SUBACROMIAL DECOMPRESSION, PARTIAL ACROMIOPLASTY WITH CORACOAROMIAL LABRUM DEBRIDEMENT RELEASE DISTAL CLAVICULECTOMY,  ROTATOR CUFF REPAIR AND EXTENSIVE DEBRIDEMENT;  Surgeon: Ninetta Lights, MD;  Location: [place];  Service: Orthopedics;  Laterality: Right;  . TENDON REPAIR  June 06, 2011   right elbow, Dr. Percell Miller    There were no vitals filed for this visit.      Subjective Assessment - 04/29/17 0804    Subjective Pt. reporting pain free while sleeping.     Patient Stated Goals "get back to work"    Currently in Pain? Yes   Pain Score 1    Pain Location Shoulder   Pain Orientation Right   Pain Descriptors / Indicators Sharp   Pain Type Surgical pain   Pain Onset More than a month ago   Pain Frequency Intermittent   Multiple Pain Sites No  [facility] Adult PT Treatment/Exercise - 04/29/17 8413      Shoulder Exercises: Standing   Horizontal ABduction Both;15 reps;Theraband  3" hold    Theraband Level (Shoulder Horizontal ABduction) Level 3 (Green)   External Rotation Theraband;20 reps;Both   Theraband Level (Shoulder External Rotation) Level 2 (Red)   External Rotation Limitations shoulders at 90 dg ABD  only min cueing for technique and pain free thus added HEP   Internal Rotation Theraband;15  reps;Both   Theraband Level (Shoulder Internal Rotation) Level 3 (Green)   Internal Rotation Limitations Shoulders at 90 dg ABD   only min cueing for technique and pain free added to HEP    Row Both;20 reps   Row Limitations TRX low x 20 reps & mid rows x 15 reps     Shoulder Exercises: ROM/Strengthening   UBE (Upper Arm Bike) lvl 5.5 fwd/back 3' each   Cybex Press 25 reps   Cybex Press Limitations 35#, chest press +   Cybex Row 15 reps   Cybex Row Limitations 35#; 3" hold - upper handles    Wall Pushups 15 reps   Pushups Limitations at counter; ~ 45 dg    Side Plank 2 reps   Side Plank Limitations 15" x 2 reps  R sidelying on elbow; pt. unable to hold longer than 15"   Rhythmic Stabilization, Supine Standing R med ball bounce (2000Gr) in overhead scaption<>flexion arc 1 min     Other ROM/Strengthening Exercises BATCA [place] 45# x 20 reps   Other ROM/Strengthening Exercises R shoulder overhead 3# from middle <>top shelf x 90 sec                 PT Education - 04/29/17 0820    Education provided Yes   Education Details 90dg ER with red TB, 90 dg IR with green TB    Person(s) Educated Patient   Methods Explanation;Demonstration;Verbal cues;Handout   Comprehension Verbalized understanding;Returned demonstration;Verbal cues required;Need further instruction          PT Short Term Goals - 01/23/17 0951      PT SHORT TERM GOAL #1   Title R shoulder PROM forward flexion to 140, ER at side to 40, & ABD without rotation to 80 by 01/24/17    Status Achieved     PT SHORT TERM GOAL #2   Title independent with basic HEP by 01/17/17   Status Achieved           PT Long Term Goals - 04/29/17 0808      PT LONG TERM GOAL #1   Title R shoulder AROM WFL by 05/09/17   Status Achieved     PT LONG TERM GOAL #2   Title pt is able to return to all ADLs, chores, and work duties without limitation by shoulder pain, LOM, or weakness by 05/09/17.   Status Achieved     PT LONG TERM  GOAL #3   Title pt able to sleep without limitation by shoulder pain by 05/09/17   Status Achieved     PT LONG TERM GOAL #4   Title R shoulder MMT >/= to 4/5 by 05/09/17   Status Partially Met  5.1.18: still 4-/5 with R shoulder abd, ER with slight pain on resistance                Plan - 04/29/17 0853    Clinical Impression Statement Pt. reporting he is now pain free while sleeping on R side in bed.  Pt. performed  well with strengthening therex today with appropriate fatigue reported following overhead strengthening activities.  Able to demo good overall technique with shoulder height IR/ER thus these activities added to home program.  Pt. progressing well at this point.     PT Treatment/Interventions Patient/family education;ADLs/Self Care Home Management;Passive range of motion;Manual techniques;Therapeutic exercise;Therapeutic activities;Cryotherapy;Vasopneumatic Device;Moist Heat;Electrical Stimulation;Scar mobilization;Taping;Iontophoresis 10m/ml Dexamethasone   PT Next Visit Plan Progress overhead strengthening per pt. tolerance & protocol; Manual therapy for STM/MFR, PROM & joint mobs as indicated for pain and ROM; Modalities PRN      Patient will benefit from skilled therapeutic intervention in order to improve the following deficits and impairments:  Decreased range of motion, Decreased strength, Pain, Impaired UE functional use, Decreased activity tolerance, Decreased scar mobility, Postural dysfunction  Visit Diagnosis: Right shoulder pain, unspecified chronicity  Stiffness of right shoulder, not elsewhere classified  Abnormal posture  Muscle weakness (generalized)     Problem List Patient Active Problem List   Diagnosis Date Noted  . Dyslipidemia (high LDL; low HDL) 08/15/2016  . Complete rotator cuff tear of left shoulder 07/05/2015  . Tennis elbow 05/30/2014  . Rotator cuff (capsule) sprain 03/10/2014  . Right shoulder pain 02/11/2014  . Memory difficulty  12/18/2012  . Gynecomastia, male 07/30/2012  . OSA (obstructive sleep apnea) 04/23/2011  . EXTERNAL HEMORRHOIDS 02/12/2011  . [place] 11/12/2010  . PLANTAR FASCIITIS 04/09/2010  . Hypothyroidism 12/08/2009  . NEOPLASM OF UNCERTAIN BEHAVIOR OF SKIN 03/10/2009  . OSTEOARTHRITIS 02/14/2009  . HEARING DEFICIT 02/08/2009  . Essential hypertension 02/08/2009  . ALLERGIC RHINITIS 02/08/2009  . GERD 02/08/2009  . PROTEINURIA 02/08/2009  . DIVERTICULITIS, HX OF 02/08/2009    MBess Harvest PTA 04/29/17 8:57 AM  [facility] [place] [address]Phone: [phone]  Fax:  [phone] Name: Zachary KAINZMRN: [id number] of Birth: [birth year]

## 2017-05-01 ENCOUNTER — Ambulatory Visit: Payer: 59 | Admitting: Physical Therapy

## 2017-05-01 ENCOUNTER — Ambulatory Visit (HOSPITAL_COMMUNITY)
Admission: RE | Admit: 2017-05-01 | Discharge: 2017-05-01 | Disposition: A | Discharge status: Home / Self Care | Payer: 59 | Source: Ambulatory Visit | Attending: Urology | Admitting: Urology

## 2017-05-01 DIAGNOSIS — C61 Malignant neoplasm of prostate: Secondary | ICD-10-CM | POA: Insufficient documentation

## 2017-05-01 LAB — POCT I-STAT CREATININE: CREATININE: 1 mg/dL (ref 0.61–1.24)

## 2017-05-01 MED ORDER — GADOBENATE DIMEGLUMINE 529 MG/ML IV SOLN
20.0000 mL | Freq: Once | INTRAVENOUS | Status: AC | PRN
  Administered 2017-05-01: 19 mL via INTRAVENOUS

## 2017-05-01 MED ORDER — LIDOCAINE HCL 2 % EX GEL
CUTANEOUS | Status: AC
  Filled 2017-05-01: qty 30

## 2017-05-06 ENCOUNTER — Ambulatory Visit: Payer: 59

## 2017-05-06 DIAGNOSIS — R293 Abnormal posture: Secondary | ICD-10-CM

## 2017-05-06 DIAGNOSIS — M25611 Stiffness of right shoulder, not elsewhere classified: Secondary | ICD-10-CM | POA: Diagnosis not present

## 2017-05-06 DIAGNOSIS — M25511 Pain in right shoulder: Secondary | ICD-10-CM

## 2017-05-06 DIAGNOSIS — M6281 Muscle weakness (generalized): Secondary | ICD-10-CM

## 2017-05-06 NOTE — Therapy (Signed)
[facility] [place] [address]  [place], [place], [address] Phone: [phone]   Fax:  [phone]  Physical Therapy Treatment  Patient Details  Name: Zachary [name] MRN: [id number] Date of Birth: 1951-06-07 Referring Provider: Dr. Corky Mull  Encounter Date: 05/06/2017      PT End of Session - 05/06/17 0800    Visit Number 33   Number of Visits 38   Date for PT Re-Evaluation 05/09/17   PT Start Time 0758   PT Stop Time 0842   PT Time Calculation (min) 44 min   Activity Tolerance Patient tolerated treatment well   Behavior During Therapy [facility] for tasks assessed/performed      Past Medical History:  Diagnosis Date  . Arthritis   . Articular cartilage disease    left shoulder  . Cancer ([place]) 2015   positive prostate cancer bx  . Diverticulitis   . Dyslipidemia (high LDL; low HDL) 08/15/2016  . GERD (gastroesophageal reflux disease)   . Hearing problem    hearing deficit  . Hemorrhoids   . Hypertension   . Hypothyroidism   . Sleep apnea    uses a cpap  . Wears glasses   . Wears hearing aid    both ears    Past Surgical History:  Procedure Laterality Date  . BACK SURGERY  1978   herniated disksurgery  . CARDIAC CATHETERIZATION  08/01/2010  . COLONOSCOPY    . EXAM UNDER ANESTHESIA WITH MANIPULATION OF SHOULDER Right 09/15/2014   Procedure: RIGHT SHOULDER MANIPULATION UNDER ANESTHESIA;  Surgeon: Ninetta Lights, MD;  Location: [place];  Service: Orthopedics;  Laterality: Right;  . HEMORRHOID SURGERY  08/2006  . SHOULDER ARTHROSCOPY WITH DISTAL CLAVICLE RESECTION Left 07/07/2015   Procedure: SHOULDER ARTHROSCOPY WITH DISTAL CLAVICLE RESECTION;  Surgeon: Kathryne Hitch, MD;  Location: [place];  Service: Orthopedics;  Laterality: Left;  . SHOULDER ARTHROSCOPY WITH ROTATOR CUFF REPAIR Left 12/07/2015   Procedure: LEFT SHOULDER ARTHROSCOPY DEBRIDEMENT, WITH ROTATOR CUFF  REPAIR;  Surgeon: Ninetta Lights, MD;  Location: [place];  Service: Orthopedics;  Laterality: Left;  . SHOULDER ARTHROSCOPY WITH ROTATOR CUFF REPAIR AND SUBACROMIAL DECOMPRESSION Left 07/07/2015   Procedure: LEFT SHOULDER SCOPE DEBRIDEMENT, SUBACROMIAL DECOMPRESSION, DISTAL CLAVICULECTOMY, ROTATOR CUFF REPAIR  ;  Surgeon: Kathryne Hitch, MD;  Location: [facility];  Service: Orthopedics;  Laterality: Left;  ANESTHESIA: GENERAL, PRE/POST OP SCALENE  . SHOULDER ARTHROSCOPY WITH SUBACROMIAL DECOMPRESSION, ROTATOR CUFF REPAIR AND BICEP TENDON REPAIR Right 03/10/2014   Procedure: RIGHT SHOULDER ARTHROSCOPY WITH SUBACROMIAL DECOMPRESSION, PARTIAL ACROMIOPLASTY WITH CORACOAROMIAL LABRUM DEBRIDEMENT RELEASE DISTAL CLAVICULECTOMY,  ROTATOR CUFF REPAIR AND EXTENSIVE DEBRIDEMENT;  Surgeon: Ninetta Lights, MD;  Location: [facility];  Service: Orthopedics;  Laterality: Right;  . TENDON REPAIR  June 06, 2011   right elbow, Dr. Percell Miller    There were no vitals filed for this visit.      Subjective Assessment - 05/06/17 0759    Subjective Pt. doing well today no changes.  Sleeping through night without pain.    Patient Stated Goals "get back to work"    Currently in Pain? No/denies   Pain Score 0-No pain   Multiple Pain Sites No                         [facility] Adult PT Treatment/Exercise - 05/06/17 0805      Self-Care  Self-Care Other Self-Care Comments   Other Self-Care Comments  Discussion of current HEP for appropriateness      Shoulder Exercises: Standing   External Rotation Theraband;20 reps;Both   Theraband Level (Shoulder External Rotation) Level 2 (Red)   External Rotation Limitations shoulders at 90 dg ABD  good technique    Internal Rotation Theraband;15 reps;Both   Theraband Level (Shoulder Internal Rotation) Level 3 (Green)   Internal Rotation Limitations Shoulders at 90 dg ABD   good technique    Flexion Theraband;20  reps;Right;Strengthening   Theraband Level (Shoulder Flexion) Level 2 (Red)   Flexion Limitations full range    ABduction Right;Theraband;20 reps   Theraband Level (Shoulder ABduction) Level 2 (Red)   ABduction Limitations full range   Row Both;20 reps   Row Limitations TRX mid x 20 reps 15 reps     Shoulder Exercises: ROM/Strengthening   UBE (Upper Arm Bike) lvl 5.5 fwd/back 4' each   Cybex Row 10 reps   Cybex Row Limitations 45#; 3" hold - upper handles, low handles   2 sets    Wall Pushups 15 reps   Pushups Limitations at counter; ~ 45 dg    Rhythmic Stabilization, Supine Standing R med ball bounce (2000Gr) in overhead flexion and ER at 90 dg abduction position x 1 min each way   Other ROM/Strengthening Exercises half pushups hanging off table on peanut p-ball x 10 reps    Other ROM/Strengthening Exercises R shoulder overhead 3# from middle <>top shelf x 90 sec      Shoulder Exercises: Stretch   Corner Stretch 2 reps;30 seconds   Corner Stretch Limitations middle doorway stretch   Other Shoulder Stretches R shoulder internal rotation stretch with towel x 30 sec    Other Shoulder Stretches Childs pose R lats stretch x 30 sec                 PT Education - 05/06/17 [phone]    Education provided Yes   Education Details Internal rotation stretch with towel, corner stretch in mid position for chest and external rotation, wall pushups   Person(s) Educated Patient   Methods Explanation;Demonstration;Verbal cues;Handout   Comprehension Verbalized understanding;Returned demonstration;Verbal cues required;Need further instruction          PT Short Term Goals - 01/23/17 0951      PT SHORT TERM GOAL #1   Title R shoulder PROM forward flexion to 140, ER at side to 40, & ABD without rotation to 80 by 01/24/17    Status Achieved     PT SHORT TERM GOAL #2   Title independent with basic HEP by 01/17/17   Status Achieved           PT Long Term Goals - 04/29/17 0808      PT  LONG TERM GOAL #1   Title R shoulder AROM WFL by 05/09/17   Status Achieved     PT LONG TERM GOAL #2   Title pt is able to return to all ADLs, chores, and work duties without limitation by shoulder pain, LOM, or weakness by 05/09/17.   Status Achieved     PT LONG TERM GOAL #3   Title pt able to sleep without limitation by shoulder pain by 05/09/17   Status Achieved     PT LONG TERM GOAL #4   Title R shoulder MMT >/= to 4/5 by 05/09/17   Status Partially Met  5.1.18: still 4-/5 with R shoulder abd, ER with slight pain on  resistance                Plan - 05/06/17 0800    Clinical Impression Statement Pt. doing well today pain free.  Pt. tolerated progression of overhead rhythmic stabilization and RTC strengthening activity today well.  Pt. able to perform 90 abduction ER, IR with TB resistance well today without cueing.  Some discussion of current HEP today to check for pt. understanding and adherence in preparation for upcoming transition to home program.  HEP updated to include shoulder IR stretch, corner stretch, and counter pushups with pt. able to perform all well.  Pt. anticipates transition to home program at upcoming visit.   PT Treatment/Interventions Patient/family education;ADLs/Self Care Home Management;Passive range of motion;Manual techniques;Therapeutic exercise;Therapeutic activities;Cryotherapy;Vasopneumatic Device;Moist Heat;Electrical Stimulation;Scar mobilization;Taping;Iontophoresis 23m/ml Dexamethasone   PT Next Visit Plan Pt. anticipates d/c       Patient will benefit from skilled therapeutic intervention in order to improve the following deficits and impairments:  Decreased range of motion, Decreased strength, Pain, Impaired UE functional use, Decreased activity tolerance, Decreased scar mobility, Postural dysfunction  Visit Diagnosis: Right shoulder pain, unspecified chronicity  Stiffness of right shoulder, not elsewhere classified  Abnormal  posture  Muscle weakness (generalized)     Problem List Patient Active Problem List   Diagnosis Date Noted  . Dyslipidemia (high LDL; low HDL) 08/15/2016  . Complete rotator cuff tear of left shoulder 07/05/2015  . Tennis elbow 05/30/2014  . Rotator cuff (capsule) sprain 03/10/2014  . Right shoulder pain 02/11/2014  . Memory difficulty 12/18/2012  . Gynecomastia, male 07/30/2012  . OSA (obstructive sleep apnea) 04/23/2011  . EXTERNAL HEMORRHOIDS 02/12/2011  . [place] 11/12/2010  . PLANTAR FASCIITIS 04/09/2010  . Hypothyroidism 12/08/2009  . NEOPLASM OF UNCERTAIN BEHAVIOR OF SKIN 03/10/2009  . OSTEOARTHRITIS 02/14/2009  . HEARING DEFICIT 02/08/2009  . Essential hypertension 02/08/2009  . ALLERGIC RHINITIS 02/08/2009  . GERD 02/08/2009  . PROTEINURIA 02/08/2009  . DIVERTICULITIS, HX OF 02/08/2009    MBess Harvest PTA 05/06/17 1:04 PM  [facility] [address] [place] [address]Phone: [phone]  Fax:  [phone] Name: Zachary CEDERBERGMRN: [id number] of Birth: [birth year]

## 2017-05-07 DIAGNOSIS — C61 Malignant neoplasm of prostate: Secondary | ICD-10-CM | POA: Diagnosis not present

## 2017-05-08 ENCOUNTER — Ambulatory Visit: Payer: 59 | Admitting: Physical Therapy

## 2017-05-08 DIAGNOSIS — M25611 Stiffness of right shoulder, not elsewhere classified: Secondary | ICD-10-CM

## 2017-05-08 DIAGNOSIS — R293 Abnormal posture: Secondary | ICD-10-CM | POA: Diagnosis not present

## 2017-05-08 DIAGNOSIS — M25511 Pain in right shoulder: Secondary | ICD-10-CM | POA: Diagnosis not present

## 2017-05-08 DIAGNOSIS — M6281 Muscle weakness (generalized): Secondary | ICD-10-CM

## 2017-05-08 MED FILL — ALPRAZolam 1 MG TABS: 1 | 1 days supply | Qty: 1 | Fill #0

## 2017-05-08 NOTE — Therapy (Addendum)
[facility] [address]  [place], [place], [address] Phone: [phone]   Fax:  [phone]  Physical Therapy Treatment  Patient Details  Name: Zachary [name] MRN: [id number] Date of Birth: Jul 12, 1951 Referring Provider: Dr. Corky Mull  Encounter Date: 05/08/2017      PT End of Session - 05/08/17 0800    Visit Number 34   Number of Visits 38   Date for PT Re-Evaluation 05/09/17   PT Start Time 0800   PT Stop Time 0840   PT Time Calculation (min) 40 min   Activity Tolerance Patient tolerated treatment well   Behavior During Therapy [facility] for tasks assessed/performed      Past Medical History:  Diagnosis Date  . Arthritis   . Articular cartilage disease    left shoulder  . Cancer ([place]) 2015   positive prostate cancer bx  . Diverticulitis   . Dyslipidemia (high LDL; low HDL) 08/15/2016  . GERD (gastroesophageal reflux disease)   . Hearing problem    hearing deficit  . Hemorrhoids   . Hypertension   . Hypothyroidism   . Sleep apnea    uses a cpap  . Wears glasses   . Wears hearing aid    both ears    Past Surgical History:  Procedure Laterality Date  . BACK SURGERY  1978   herniated disksurgery  . CARDIAC CATHETERIZATION  08/01/2010  . COLONOSCOPY    . EXAM UNDER ANESTHESIA WITH MANIPULATION OF SHOULDER Right 09/15/2014   Procedure: RIGHT SHOULDER MANIPULATION UNDER ANESTHESIA;  Surgeon: Ninetta Lights, MD;  Location: [place];  Service: Orthopedics;  Laterality: Right;  . HEMORRHOID SURGERY  08/2006  . SHOULDER ARTHROSCOPY WITH DISTAL CLAVICLE RESECTION Left 07/07/2015   Procedure: SHOULDER ARTHROSCOPY WITH DISTAL CLAVICLE RESECTION;  Surgeon: Kathryne Hitch, MD;  Location: [place];  Service: Orthopedics;  Laterality: Left;  . SHOULDER ARTHROSCOPY WITH ROTATOR CUFF REPAIR Left 12/07/2015   Procedure: LEFT SHOULDER ARTHROSCOPY DEBRIDEMENT, WITH ROTATOR CUFF  REPAIR;  Surgeon: Ninetta Lights, MD;  Location: [place];  Service: Orthopedics;  Laterality: Left;  . SHOULDER ARTHROSCOPY WITH ROTATOR CUFF REPAIR AND SUBACROMIAL DECOMPRESSION Left 07/07/2015   Procedure: LEFT SHOULDER SCOPE DEBRIDEMENT, SUBACROMIAL DECOMPRESSION, DISTAL CLAVICULECTOMY, ROTATOR CUFF REPAIR  ;  Surgeon: Kathryne Hitch, MD;  Location: [place];  Service: Orthopedics;  Laterality: Left;  ANESTHESIA: GENERAL, PRE/POST OP SCALENE  . SHOULDER ARTHROSCOPY WITH SUBACROMIAL DECOMPRESSION, ROTATOR CUFF REPAIR AND BICEP TENDON REPAIR Right 03/10/2014   Procedure: RIGHT SHOULDER ARTHROSCOPY WITH SUBACROMIAL DECOMPRESSION, PARTIAL ACROMIOPLASTY WITH CORACOAROMIAL LABRUM DEBRIDEMENT RELEASE DISTAL CLAVICULECTOMY,  ROTATOR CUFF REPAIR AND EXTENSIVE DEBRIDEMENT;  Surgeon: Ninetta Lights, MD;  Location: [place];  Service: Orthopedics;  Laterality: Right;  . TENDON REPAIR  June 06, 2011   right elbow, Dr. Percell Miller    There were no vitals filed for this visit.      Subjective Assessment - 05/08/17 0800    Subjective Pt feels like shoulder is back to 90% of normal.   Patient Stated Goals "get back to work"    Currently in Pain? No/denies            [facility] PT Assessment - 05/08/17 0800      Assessment   Medical Diagnosis R RTC Repair with bone cyst removal & bone graft of humeral head   Referring Provider Dr. Corky Mull   Onset Date/Surgical Date 11/29/16  Observation/Other Assessments   Focus on Therapeutic Outcomes (FOTO)  Shoulder 68% (32% limitation)     AROM   Right Shoulder Flexion 171 Degrees   Right Shoulder ABduction 170 Degrees   Right Shoulder Internal Rotation 90 Degrees   Right Shoulder External Rotation 91 Degrees     Strength   Right Shoulder Flexion 4+/5   Right Shoulder ABduction 4/5   Right Shoulder Internal Rotation 4+/5   Right Shoulder External Rotation 4/5   Left Shoulder Flexion 4+/5   Left  Shoulder ABduction 4/5   Left Shoulder Internal Rotation 4+/5   Left Shoulder External Rotation 4+/5                     [facility] Adult PT Treatment/Exercise - 05/08/17 0800      Shoulder Exercises: Standing   External Rotation Right;20 reps;Theraband;Strengthening   Theraband Level (Shoulder External Rotation) Level 2 (Red)   External Rotation Limitations shoudler at 90 dg ABD   Internal Rotation Right;20 reps;Theraband;Strengthening   Theraband Level (Shoulder Internal Rotation) Level 3 (Green)   Internal Rotation Limitations shoudler at 90 dg ABD   Flexion Right;20 reps;Theraband;Strengthening   Theraband Level (Shoulder Flexion) Level 2 (Red)   Flexion Limitations full range    ABduction Right;20 reps;Theraband;Strengthening   Theraband Level (Shoulder ABduction) Level 2 (Red)   ABduction Limitations full range   Extension Both;20 reps;Theraband;Strengthening   Theraband Level (Shoulder Extension) Level 3 (Green)   Extension Limitations + scap retraction   Row Both;20 reps;Theraband;Strengthening   Theraband Level (Shoulder Row) Level 3 (Green)   Retraction Both;20 reps;Theraband;Strengthening   Theraband Level (Shoulder Retraction) Level 3 (Green)   Retraction Limitations "W" row     Shoulder Exercises: ROM/Strengthening   UBE (Upper Arm Bike) lvl 5.5 fwd/back 3' each                  PT Short Term Goals - 01/23/17 5456      PT SHORT TERM GOAL #1   Title R shoulder PROM forward flexion to 140, ER at side to 40, & ABD without rotation to 80 by 01/24/17    Status Achieved     PT SHORT TERM GOAL #2   Title independent with basic HEP by 01/17/17   Status Achieved           PT Long Term Goals - 05/08/17 0806      PT LONG TERM GOAL #1   Title R shoulder AROM WFL by 05/09/17   Status Achieved     PT LONG TERM GOAL #2   Title pt is able to return to all ADLs, chores, and work duties without limitation by shoulder pain, LOM, or weakness by 05/09/17.    Status Achieved     PT LONG TERM GOAL #3   Title pt able to sleep without limitation by shoulder pain by 05/09/17   Status Achieved     PT LONG TERM GOAL #4   Title R shoulder MMT >/= to 4/5 by 05/09/17   Status Achieved               Plan - 05/08/17 0806    Clinical Impression Statement Rmani has made excellent progress with PT following R RCR and feels that R shoulder is back to 90% at this time. Pt now has full R shoulder AROM and strength >/= 4/5 in all motions. Pt reporting no further sleep disturbance and is able to perform all ADL's, chores and work duties w/o  limitation due to R shoulder. Pt able to demonstrate all HEP exercises appropriately and is comfortable with plan to transition to HEP at this time. All goals met at this time and pt ready for discharge from PT. Pt scheduled to see MD on 05/16/17, therefore will keep chart open until then in the event MD indicates need for further PT.   Clinical Impairments Affecting Rehab Potential history of multiple bilateral RTC surgeries; history of R elbow surgery   PT Treatment/Interventions Patient/family education;ADLs/Self Care Home Management;Passive range of motion;Manual techniques;Therapeutic exercise;Therapeutic activities;Cryotherapy;Vasopneumatic Device;Moist Heat;Electrical Stimulation;Scar mobilization;Taping;Iontophoresis 64m/ml Dexamethasone   PT Next Visit Plan Discharge if MD in agreement   Consulted and Agree with Plan of Care Patient      Patient will benefit from skilled therapeutic intervention in order to improve the following deficits and impairments:  Decreased range of motion, Decreased strength, Pain, Impaired UE functional use, Decreased activity tolerance, Decreased scar mobility, Postural dysfunction  Visit Diagnosis: Right shoulder pain, unspecified chronicity  Stiffness of right shoulder, not elsewhere classified  Abnormal posture  Muscle weakness (generalized)     Problem List Patient  Active Problem List   Diagnosis Date Noted  . Dyslipidemia (high LDL; low HDL) 08/15/2016  . Complete rotator cuff tear of left shoulder 07/05/2015  . Tennis elbow 05/30/2014  . Rotator cuff (capsule) sprain 03/10/2014  . Right shoulder pain 02/11/2014  . Memory difficulty 12/18/2012  . Gynecomastia, male 07/30/2012  . OSA (obstructive sleep apnea) 04/23/2011  . EXTERNAL HEMORRHOIDS 02/12/2011  . [place] cancer [place] 11/12/2010  . PLANTAR FASCIITIS 04/09/2010  . Hypothyroidism 12/08/2009  . NEOPLASM OF UNCERTAIN BEHAVIOR OF SKIN 03/10/2009  . OSTEOARTHRITIS 02/14/2009  . HEARING DEFICIT 02/08/2009  . Essential hypertension 02/08/2009  . ALLERGIC RHINITIS 02/08/2009  . GERD 02/08/2009  . PROTEINURIA 02/08/2009  . DIVERTICULITIS, HX OF 02/08/2009    JPercival Spanish PT, MPT 05/08/2017, 8:51 AM  [facility] [place] [address]Phone: [phone]  Fax:  [phone] Name: FKAGE WILLMANNMRN: [id number] of Birth: 1December 22, 1952 PHYSICAL THERAPY DISCHARGE SUMMARY  Visits from Start of Care: 362 Current functional level related to goals / functional outcomes:   Refer to above clinical impression. Pt was to have seen MD on 05/16/17 and has not indicated need for return to PT, therefore will proceed with discharge from PT for this episode.   Remaining deficits:   As above.   Education / Equipment:   HEP  Plan: Patient agrees to discharge.  Patient goals were met. Patient is being discharged due to meeting the stated rehab goals.  ?????     JPercival Spanish PT, MPT 05/27/17, 9:15 AM  [facility] [place] [address]Phone: [phone]  Fax:  [phone]

## 2017-05-12 DIAGNOSIS — L439 Lichen planus, unspecified: Secondary | ICD-10-CM | POA: Diagnosis not present

## 2017-05-12 DIAGNOSIS — K121 Other forms of stomatitis: Secondary | ICD-10-CM | POA: Diagnosis not present

## 2017-05-12 DIAGNOSIS — L438 Other lichen planus: Secondary | ICD-10-CM | POA: Diagnosis not present

## 2017-05-13 ENCOUNTER — Ambulatory Visit: Payer: 59 | Admitting: Physical Therapy

## 2017-05-16 DIAGNOSIS — M25512 Pain in left shoulder: Secondary | ICD-10-CM | POA: Diagnosis not present

## 2017-05-16 DIAGNOSIS — M25511 Pain in right shoulder: Secondary | ICD-10-CM | POA: Diagnosis not present

## 2017-05-26 DIAGNOSIS — C61 Malignant neoplasm of prostate: Secondary | ICD-10-CM | POA: Diagnosis not present

## 2017-05-27 DIAGNOSIS — C61 Malignant neoplasm of prostate: Secondary | ICD-10-CM | POA: Diagnosis not present

## 2017-05-28 ENCOUNTER — Encounter: Payer: Self-pay | Admitting: Family Medicine

## 2017-06-02 MED FILL — OMEPRAZOLE DR 40 MG CAPSULE: 40 | 30 days supply | Qty: 30 | Fill #3

## 2017-06-05 DIAGNOSIS — L433 Subacute (active) lichen planus: Secondary | ICD-10-CM | POA: Diagnosis not present

## 2017-06-05 MED FILL — FOLIC ACID 1 MG TABLET: 1 | 30 days supply | Qty: 30 | Fill #0

## 2017-06-05 MED FILL — METHOTREXATE 2.5 MG TABLET: 2.5 | 14 days supply | Qty: 6 | Fill #0

## 2017-06-06 DIAGNOSIS — C61 Malignant neoplasm of prostate: Secondary | ICD-10-CM | POA: Diagnosis not present

## 2017-06-06 MED FILL — CMP-QDRL/LIDO/MYLAN/NYS1111: 12 days supply | Qty: 120 | Fill #0

## 2017-06-09 MED FILL — PRAVASTATIN NA 40 MG TAB: 40 | 90 days supply | Qty: 45 | Fill #1

## 2017-06-11 DIAGNOSIS — C61 Malignant neoplasm of prostate: Secondary | ICD-10-CM | POA: Diagnosis not present

## 2017-06-12 DIAGNOSIS — C61 Malignant neoplasm of prostate: Secondary | ICD-10-CM | POA: Diagnosis not present

## 2017-06-19 DIAGNOSIS — Z79899 Other long term (current) drug therapy: Secondary | ICD-10-CM | POA: Diagnosis not present

## 2017-06-19 DIAGNOSIS — L431 Bullous lichen planus: Secondary | ICD-10-CM | POA: Diagnosis not present

## 2017-06-20 MED FILL — METHOTREXATE 2.5 MG TABLET: 2.5 | 28 days supply | Qty: 24 | Fill #0

## 2017-06-27 MED FILL — LEVOTHYROXINE 100 MCG TAB: 100 | 90 days supply | Qty: 90 | Fill #1

## 2017-07-01 MED FILL — CMP-QDRL/LIDO/MYLAN/NYS1111: 12 days supply | Qty: 120 | Fill #1

## 2017-07-03 DIAGNOSIS — Z79899 Other long term (current) drug therapy: Secondary | ICD-10-CM | POA: Diagnosis not present

## 2017-07-03 DIAGNOSIS — L433 Subacute (active) lichen planus: Secondary | ICD-10-CM | POA: Diagnosis not present

## 2017-07-04 MED FILL — OMEPRAZOLE DR 40 MG CAPSULE: 40 | 30 days supply | Qty: 30 | Fill #4

## 2017-07-04 MED FILL — LISINOPRIL 5 MG TAB: 5 | 90 days supply | Qty: 90 | Fill #1

## 2017-07-07 DIAGNOSIS — Z01818 Encounter for other preprocedural examination: Secondary | ICD-10-CM | POA: Diagnosis not present

## 2017-07-07 DIAGNOSIS — C61 Malignant neoplasm of prostate: Secondary | ICD-10-CM | POA: Diagnosis not present

## 2017-07-07 MED FILL — FOLIC ACID 1 MG TABLET: 1 | 30 days supply | Qty: 30 | Fill #1

## 2017-07-08 DIAGNOSIS — I10 Essential (primary) hypertension: Secondary | ICD-10-CM | POA: Diagnosis not present

## 2017-07-08 DIAGNOSIS — Z885 Allergy status to narcotic agent status: Secondary | ICD-10-CM | POA: Diagnosis not present

## 2017-07-08 DIAGNOSIS — C61 Malignant neoplasm of prostate: Secondary | ICD-10-CM | POA: Diagnosis not present

## 2017-07-09 MED FILL — traMADol HCL 50 MG TABS: 50 | 2 days supply | Qty: 25 | Fill #0

## 2017-07-09 MED FILL — SULFAMETHOXAZOLE/TMP DS TAB: 800-160 | 5 days supply | Qty: 10 | Fill #0

## 2017-07-14 MED FILL — METHOTREXATE 2.5 MG TABLET: 2.5 | 28 days supply | Qty: 24 | Fill #0

## 2017-07-16 ENCOUNTER — Encounter: Payer: Self-pay | Admitting: Family Medicine

## 2017-07-16 DIAGNOSIS — C61 Malignant neoplasm of prostate: Secondary | ICD-10-CM | POA: Diagnosis not present

## 2017-07-20 IMAGING — MR MR SHOULDER*L* W/ CM
4 of 6 series · 19 of 40 positions shown · IV contrast (2ml    mh)
Comparison: MRI left shoulder 06/27/2015.

CLINICAL DATA: Left shoulder pain for 8-9 months. History of prior
rotator cuff tear with subsequent repair. No known injury.
Subsequent encounter.

EXAM:
MR ARTHROGRAM OF THE LEFT SHOULDER
TECHNIQUE: Multiplanar, multisequence MR imaging of the left shoulder was
performed following the administration of intra-articular contrast.
CONTRAST:  See Injection Documentation.

[Series 8: T1 fat-sat · axial · 3.0mm · 0.23mm/px · z∈[-58,-4]mm · 3 of 25 slices shown]
[im 4/25]
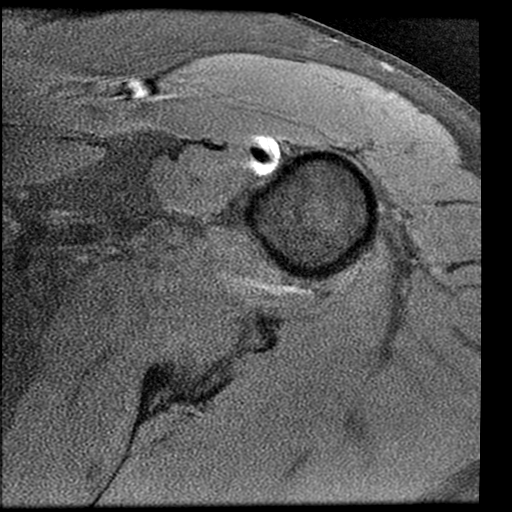
[im 14/25]
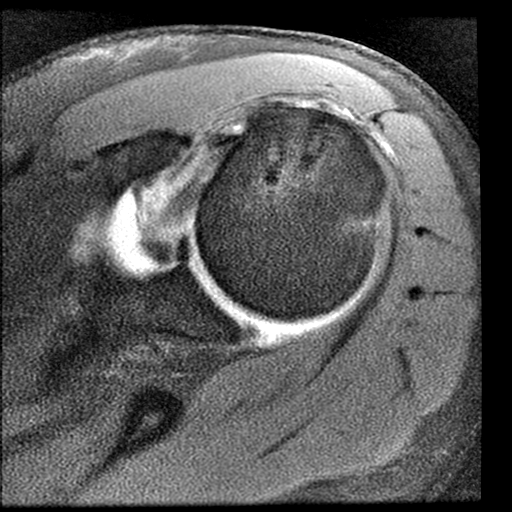
[im 21/25]
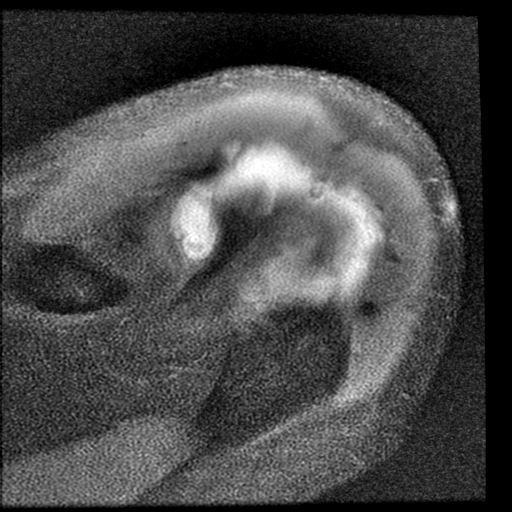

[Series 11: T2 fat-sat · oblique · 3.0mm · 0.27mm/px · 6 of 21 slices shown (1 of 2)]
[im 1/21]
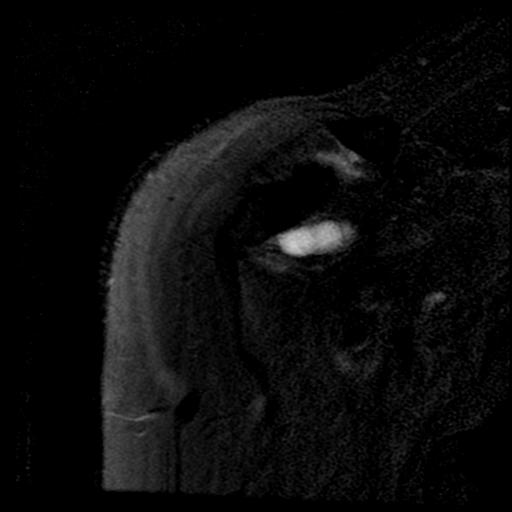
[im 5/21]
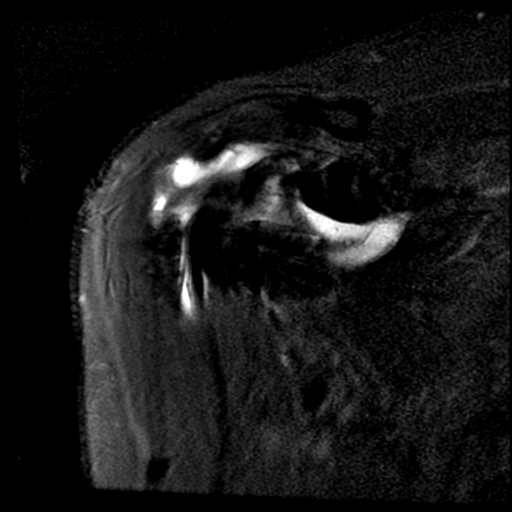
[im 9/21]
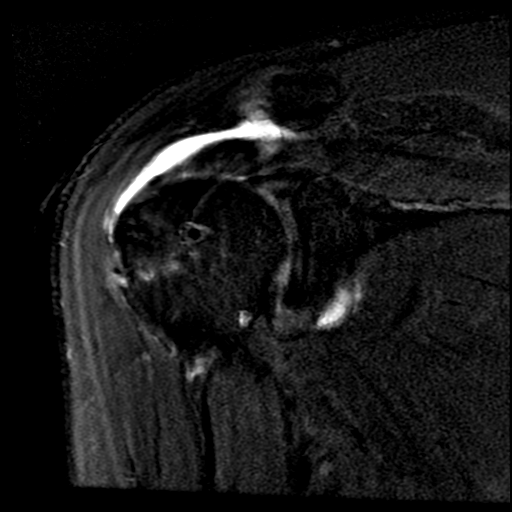
[im 13/21]
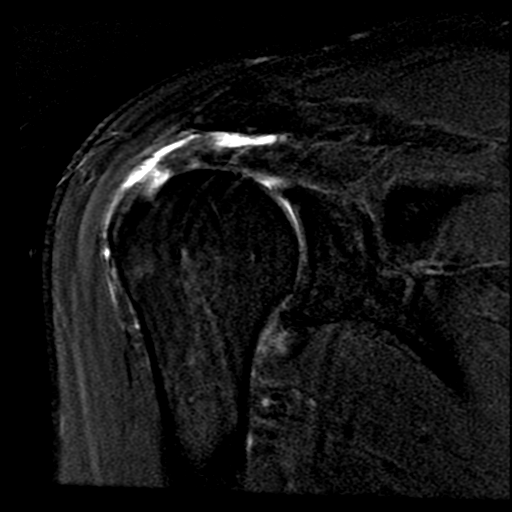
[im 17/21]
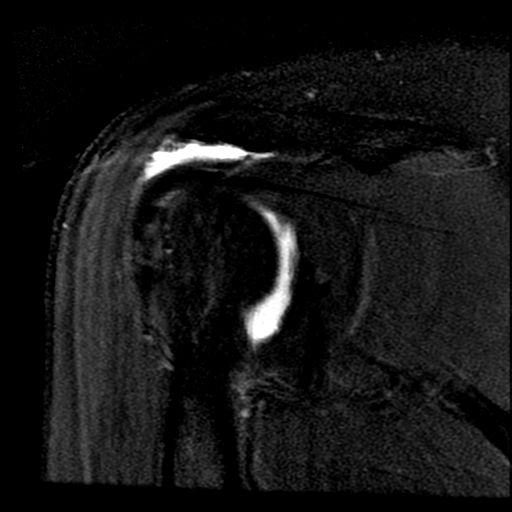
[im 21/21]
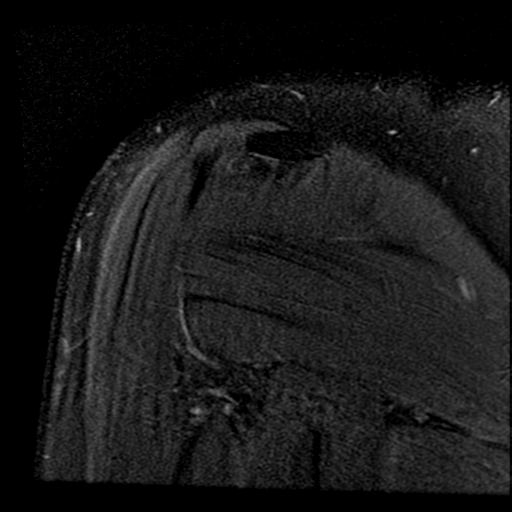

[Series 13: PD fat-sat · oblique · 3.0mm · 0.27mm/px · 6 of 21 slices shown]
[im 1/21]
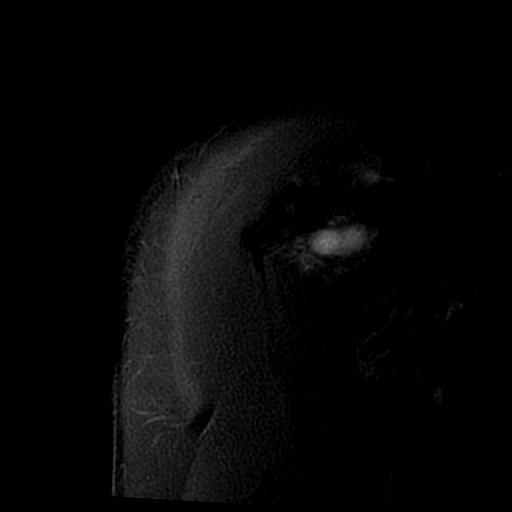
[im 5/21]
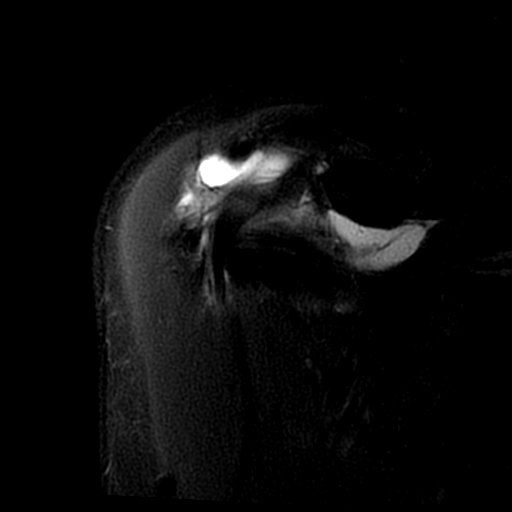
[im 9/21]
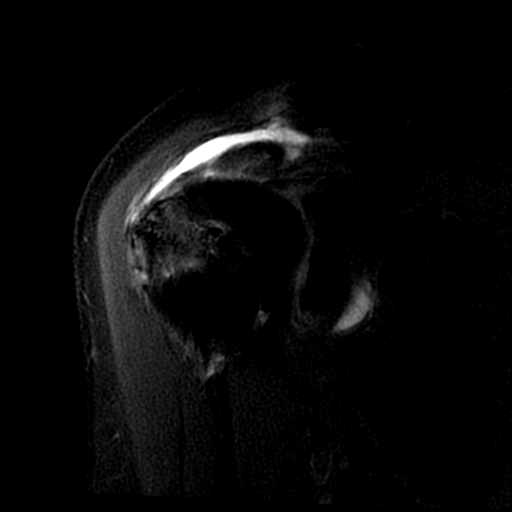
[im 13/21]
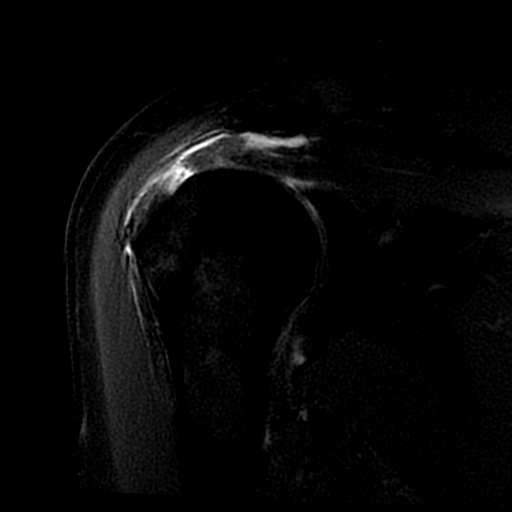
[im 17/21]
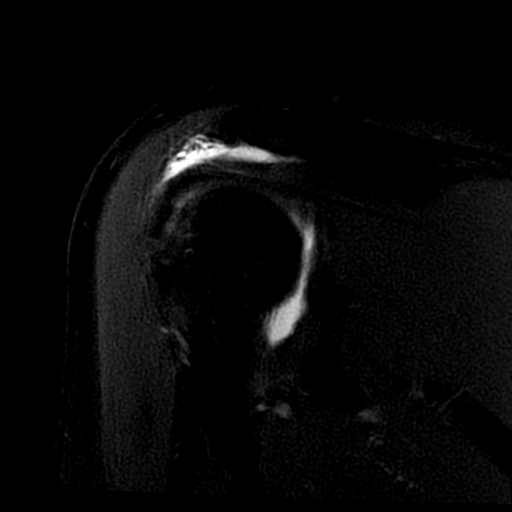
[im 21/21]
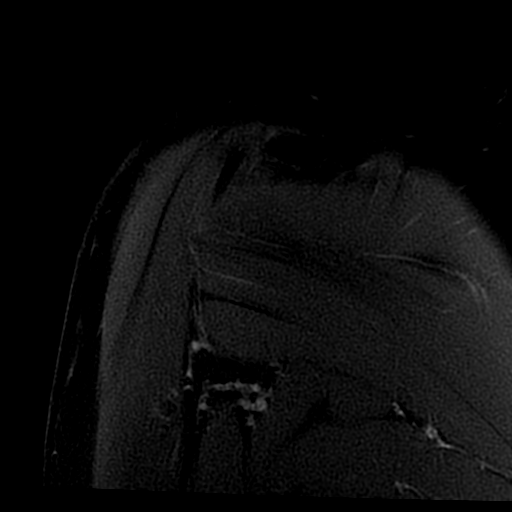

[Series 14: T2 fat-sat · oblique · 3.0mm · 0.27mm/px · 4 of 26 slices shown (2 of 2)]
[im 1/26]
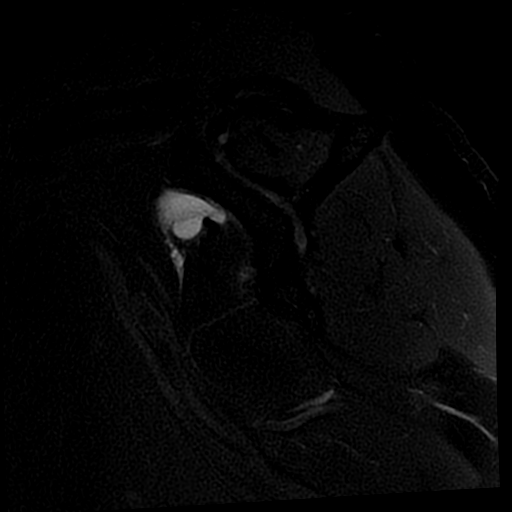
[im 4/26]
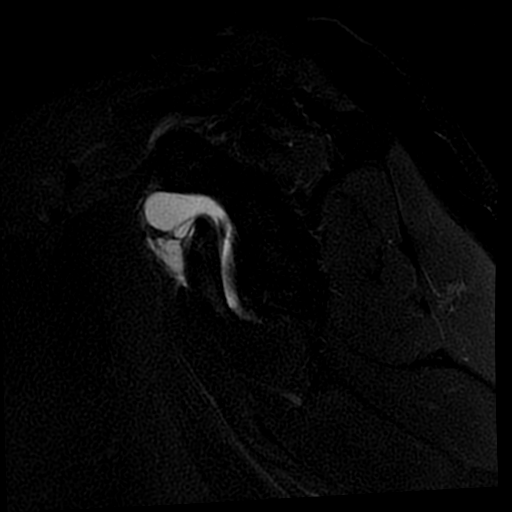
[im 15/26]
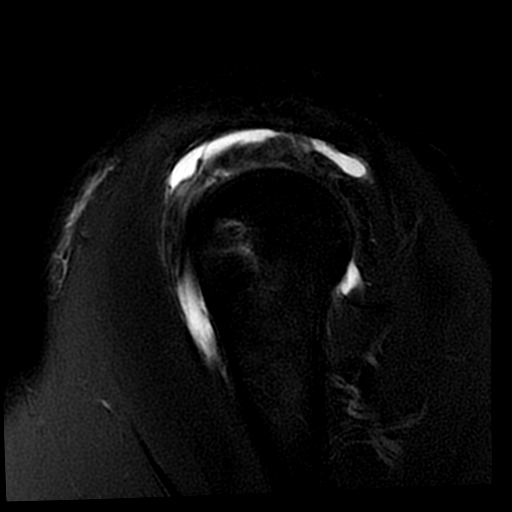
[im 22/26]
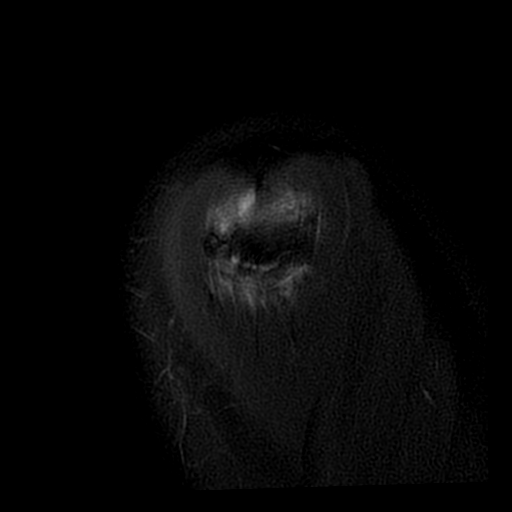

[19 of 40 positions shown; findings below may reference images not displayed]

FINDINGS: Rotator cuff: Since the prior examination, the patient has undergone
repair of a supraspinatus tendon tear. Two anchors are seen in the
anterior greater tuberosity. There is a tear of the mid to posterior
supraspinatus with both full-thickness component an articular sided
component. The tear measures a total of approximately 0.7 cm from
front to back and is centered 1.5 cm medial to the greater
tuberosity. Gap between tendon fragments is 1 cm or less. The
rotator cuff otherwise appears intact.

Muscles: No atrophy or focal lesion.

Biceps long head: Intrasubstance increased T2 signal is seen in the
intra-articular segment of the tendon consistent with tendinosis
without tear.

Acromioclavicular Joint: The joint has been debrided without
evidence of complication.

Glenohumeral Joint: Unremarkable.

Labrum: Intact.

Bones: The patient has undergone acromioplasty. No fracture or
worrisome marrow lesion is identified. Contrast is present in the
subacromial/subdeltoid bursa.
IMPRESSION: Status post supraspinatus tendon repair with a small tear in the mid
to posterior supraspinatus measuring approximately 0.6 cm from front
to back. The tear has full-thickness and undersurface components and
is centered 1.5 cm medial to the greater tuberosity. Gap between
tendon fragments is 1 cm or less. No atrophy.

Status post acromioplasty and debridement of the acromioclavicular
joint without evidence of complication.

## 2017-07-23 DIAGNOSIS — Z79899 Other long term (current) drug therapy: Secondary | ICD-10-CM | POA: Diagnosis not present

## 2017-07-23 DIAGNOSIS — L433 Subacute (active) lichen planus: Secondary | ICD-10-CM | POA: Diagnosis not present

## 2017-08-01 MED FILL — OMEPRAZOLE DR 40 MG CAPSULE: 40 | 30 days supply | Qty: 30 | Fill #5

## 2017-08-05 DIAGNOSIS — G4733 Obstructive sleep apnea (adult) (pediatric): Secondary | ICD-10-CM | POA: Diagnosis not present

## 2017-08-08 MED FILL — METHOTREXATE 2.5 MG TABLET: 2.5 | 28 days supply | Qty: 24 | Fill #0

## 2017-08-13 MED FILL — FOLIC ACID 1 MG TABLET: 1 | 30 days supply | Qty: 30 | Fill #0

## 2017-08-29 DIAGNOSIS — Z79899 Other long term (current) drug therapy: Secondary | ICD-10-CM | POA: Diagnosis not present

## 2017-08-29 DIAGNOSIS — L433 Subacute (active) lichen planus: Secondary | ICD-10-CM | POA: Diagnosis not present

## 2017-08-29 MED FILL — CMP-QDRL/LIDO/MYLAN/NYS1111: 12 days supply | Qty: 120 | Fill #0

## 2017-08-29 MED FILL — predniSONE 10 MG TABS: 10 | 21 days supply | Qty: 49 | Fill #0

## 2017-09-01 MED FILL — METHOTREXATE 2.5 MG TABLET: 2.5 | 28 days supply | Qty: 24 | Fill #0

## 2017-09-03 ENCOUNTER — Other Ambulatory Visit: Payer: Self-pay | Admitting: Family Medicine

## 2017-09-03 DIAGNOSIS — K219 Gastro-esophageal reflux disease without esophagitis: Secondary | ICD-10-CM

## 2017-09-04 MED FILL — OMEPRAZOLE DR 40 MG CAPSULE: 40 | 30 days supply | Qty: 30 | Fill #0

## 2017-09-10 MED FILL — PRAVASTATIN NA 40 MG TAB: 40 | 90 days supply | Qty: 45 | Fill #2

## 2017-09-18 MED FILL — FOLIC ACID 1 MG TABLET: 1 | 30 days supply | Qty: 30 | Fill #1

## 2017-09-18 MED FILL — CMP-QDRL/LIDO/MYLAN/NYS1111: 12 days supply | Qty: 120 | Fill #1

## 2017-09-24 ENCOUNTER — Other Ambulatory Visit: Payer: Self-pay | Admitting: Family Medicine

## 2017-09-24 MED FILL — LEVOTHYROXINE 100 MCG TAB: 100 | 90 days supply | Qty: 90 | Fill #0

## 2017-10-06 ENCOUNTER — Other Ambulatory Visit: Payer: Self-pay | Admitting: Family Medicine

## 2017-10-06 DIAGNOSIS — I10 Essential (primary) hypertension: Secondary | ICD-10-CM

## 2017-10-06 MED FILL — LISINOPRIL 5 MG TAB: 5 | 90 days supply | Qty: 90 | Fill #0

## 2017-10-06 MED FILL — OMEPRAZOLE DR 40 MG CAPSULE: 40 | 30 days supply | Qty: 30 | Fill #1

## 2017-10-08 DIAGNOSIS — C61 Malignant neoplasm of prostate: Secondary | ICD-10-CM | POA: Diagnosis not present

## 2017-10-10 DIAGNOSIS — L433 Subacute (active) lichen planus: Secondary | ICD-10-CM | POA: Diagnosis not present

## 2017-10-10 MED FILL — MYCOPHENOLATE 500 MG TABLET: 500 | 30 days supply | Qty: 60 | Fill #0

## 2017-10-15 ENCOUNTER — Encounter: Payer: Self-pay | Admitting: *Deleted

## 2017-10-15 DIAGNOSIS — C61 Malignant neoplasm of prostate: Secondary | ICD-10-CM | POA: Diagnosis not present

## 2017-10-16 ENCOUNTER — Encounter: Payer: Self-pay | Admitting: Adult Health

## 2017-10-16 ENCOUNTER — Ambulatory Visit (INDEPENDENT_AMBULATORY_CARE_PROVIDER_SITE_OTHER): Payer: 59 | Admitting: Adult Health

## 2017-10-16 VITALS — BP 106/73 | HR 76 | Ht 68.0 in | Wt 194.0 lb

## 2017-10-16 DIAGNOSIS — J309 Allergic rhinitis, unspecified: Secondary | ICD-10-CM

## 2017-10-16 DIAGNOSIS — C61 Malignant neoplasm of prostate: Secondary | ICD-10-CM | POA: Diagnosis not present

## 2017-10-16 DIAGNOSIS — G4733 Obstructive sleep apnea (adult) (pediatric): Secondary | ICD-10-CM | POA: Diagnosis not present

## 2017-10-16 NOTE — Assessment & Plan Note (Signed)
Well controlled on CPAP At bedtime    Plan  Patient Instructions  Continue on CPAP At bedtime   Order for supplies renewal.  Keep up good work.  Stay active.  Do not drive if sleepy .  Follow up Dr. Elsworth Soho  In 1 year and As needed

## 2017-10-16 NOTE — Progress Notes (Signed)
@Patient  ID: Zachary [name], male    DOB: 01-19-1951, 66 y.o.   MRN: [id number]  No chief complaint on file.   Referring provider: [name]*  HPI: 65/M , materials manager at [employer] for FU of mild OSA on cpap  Cath neg 2010  PSG showed mild obstructive sleep apnea , predmominat RERAs s/o UARS with RDI 11/h.He was desensitized with medium nasal mask. Some bruxism was noted  9/12 > changed to 11 cm based on download    10/16/2017 Follow up : OSA  Pt returns for a 1 year follow up for sleep apnea. He is doing well on CPAP At bedtime  .  Wears each night . Denies daytime sleepiness.  Download shows excellent compliance with avg usage at 6.5hr. On CPAP 11cmH2O.  AHI 0.8. Min leaks.   Dx with Prostate cancer ealier this year s/p resection . Plans for radiation therapy in January.  Bone scan 05/2017 neg for mets.   Just went to [place] with grand-daughters, had a wonderful time.   Allergies  Allergen Reactions  . Oxycodone Itching  . Simvastatin Other (See Comments)    REACTION: muscle pain    Immunization History  Administered Date(s) Administered  . Influenza Whole 11/06/2010, 10/24/2011, 09/22/2012  . Influenza-Unspecified 09/22/2014, 09/23/2015, 09/17/2016  . Td 10/17/2008  . Tdap 01/07/2013  . Zoster 08/08/2015    Past Medical History:  Diagnosis Date  . Arthritis   . Articular cartilage disease    left shoulder  . Cancer ([place]) 2015   positive prostate cancer bx  . Diverticulitis   . Dyslipidemia (high LDL; low HDL) 08/15/2016  . GERD (gastroesophageal reflux disease)   . Hearing problem    hearing deficit  . Hemorrhoids   . Hypertension   . Hypothyroidism   . Sleep apnea    uses a cpap  . Wears glasses   . Wears hearing aid    both ears    Tobacco History: History  Smoking Status  . Never Smoker  Smokeless Tobacco  . Never Used   Counseling given: Not Answered   Outpatient Encounter Prescriptions as of 10/16/2017  Medication  Sig  . acetaminophen (TYLENOL) 500 MG tablet Take 500 mg by mouth every 6 (six) hours as needed.  Marland Kitchen aspirin EC 81 MG EC tablet Take 81 mg by mouth daily.    . chlorhexidine (PERIDEX) 0.12 % solution Use as directed 15 mLs in the mouth or throat 2 (two) times daily. "Swish and spit."  . Cyanocobalamin (VITAMIN B 12 PO) Take 500 mg by mouth daily.  . cyclobenzaprine (FLEXERIL) 5 MG tablet Take 1 tablet (5 mg total) by mouth 3 (three) times daily as needed for muscle spasms.  . hydrocortisone (ANUSOL-HC) 2.5 % rectal cream Place 1 application rectally at bedtime as needed for hemorrhoids or itching.  . levothyroxine (SYNTHROID, LEVOTHROID) 100 MCG tablet TAKE 1 TABLET BY MOUTH DAILY  . lidocaine (XYLOCAINE) 2 % solution Use as directed 10 mLs in the mouth or throat every 6 (six) hours as needed for mouth pain.  Marland Kitchen lisinopril (PRINIVIL,ZESTRIL) 5 MG tablet TAKE 1 TABLET (5 MG TOTAL) BY MOUTH DAILY.  Marland Kitchen loratadine (CLARITIN) 10 MG tablet Take 10 mg by mouth daily.    . Magnesium 250 MG TABS Take 1 tablet by mouth daily.  . Melatonin 5 MG CAPS Take 2 capsules by mouth daily.  . Multiple Vitamins-Minerals (CENTRUM SILVER PO) Take 1 tablet by mouth daily.  . naproxen (EC NAPROSYN) 500  MG EC tablet Take 1 tablet (500 mg total) by mouth 2 (two) times daily with a meal.  . omeprazole (PRILOSEC) 40 MG capsule TAKE 1 CAPSULE (40 MG TOTAL) BY MOUTH DAILY.  . pravastatin (PRAVACHOL) 40 MG tablet Take 0.5 tablets (20 mg total) by mouth daily.  . sodium bicarbonate 650 MG tablet Take 1 tablet (650 mg total) by mouth daily.  Marland Kitchen triamcinolone (KENALOG) 0.1 % paste Place thin layer on mouth ulcers four times daily  . fexofenadine (ALLEGRA) 180 MG tablet Take 1 tablet (180 mg total) by mouth daily.  . ranitidine (ZANTAC) 150 MG tablet Take 1 tablet (150 mg total) by mouth 2 (two) times daily.  . [DISCONTINUED] doxycycline (VIBRAMYCIN) 100 MG capsule Take 1 capsule (100 mg total) by mouth 2 (two) times daily. Take with  food.   Facility-Administered Encounter Medications as of 10/16/2017  Medication  . 0.9 %  sodium chloride infusion     Review of Systems  Constitutional:   No  weight loss, night sweats,  Fevers, chills, fatigue, or  lassitude.  HEENT:   No headaches,  Difficulty swallowing,  Tooth/dental problems, or  Sore throat,                No sneezing, itching, ear ache, nasal congestion, post nasal drip,   CV:  No chest pain,  Orthopnea, PND, swelling in lower extremities, anasarca, dizziness, palpitations, syncope.   GI  No heartburn, indigestion, abdominal pain, nausea, vomiting, diarrhea, change in bowel habits, loss of appetite, bloody stools.   Resp: No shortness of breath with exertion or at rest.  No excess mucus, no productive cough,  No non-productive cough,  No coughing up of blood.  No change in color of mucus.  No wheezing.  No chest wall deformity  Skin: no rash or lesions.  GU: no dysuria, change in color of urine, no urgency or frequency.  No flank pain, no hematuria   MS:  No joint pain or swelling.  No decreased range of motion.  No back pain.    Physical Exam  BP 106/73 (BP Location: Left Arm, Patient Position: Sitting, Cuff Size: Normal)   Pulse 76   Ht 5\' 8"  (1.727 m)   Wt 194 lb (88 kg)   SpO2 98%   BMI 29.50 kg/m   GEN: A/Ox3; pleasant , NAD, well nourished    HEENT:  [place]/AT,  EACs-clear, TMs-wnl, NOSE-clear, THROAT-clear, no lesions, no postnasal drip or exudate noted. Class 2 MP airway   NECK:  Supple w/ fair ROM; no JVD; normal carotid impulses w/o bruits; no thyromegaly or nodules palpated; no lymphadenopathy.    RESP  Clear  P & A; w/o, wheezes/ rales/ or rhonchi. no accessory muscle use, no dullness to percussion  CARD:  RRR, no m/r/g, no peripheral edema, pulses intact, no cyanosis or clubbing.  GI:   Soft & nt; nml bowel sounds; no organomegaly or masses detected.   Musco: Warm bil, no deformities or joint swelling noted.   Neuro: alert, no  focal deficits noted.    Skin: Warm, no lesions or rashes    Lab Results:   BNP No results found for: BNP  ProBNP No results found for: PROBNP  Imaging: No results found.   Assessment & Plan:   OSA (obstructive sleep apnea) Well controlled on CPAP At bedtime    Plan  Patient Instructions  Continue on CPAP At bedtime   Order for supplies renewal.  Keep up good work.  Stay  active.  Do not drive if sleepy .  Follow up Dr. Elsworth Soho  In 1 year and As needed       Allergic rhinitis Controlled without flare    Prostate cancer [facility]) Cont follow up with Urology at [facility], NP 10/16/2017

## 2017-10-16 NOTE — Patient Instructions (Signed)
Continue on CPAP At bedtime   Order for supplies renewal.  Keep up good work.  Stay active.  Do not drive if sleepy .  Follow up Dr. Elsworth Soho  In 1 year and As needed

## 2017-10-16 NOTE — Assessment & Plan Note (Signed)
Controlled without flare

## 2017-10-16 NOTE — Assessment & Plan Note (Signed)
Cont follow up with Urology at [facility]

## 2017-10-20 MED FILL — SODIUM BICARB 10 GRAIN TAB: 650 | 90 days supply | Qty: 360 | Fill #1

## 2017-10-24 DIAGNOSIS — Z79899 Other long term (current) drug therapy: Secondary | ICD-10-CM | POA: Diagnosis not present

## 2017-11-03 MED FILL — OMEPRAZOLE DR 40 MG CAPSULE: 40 | 30 days supply | Qty: 30 | Fill #2

## 2017-11-07 DIAGNOSIS — L433 Subacute (active) lichen planus: Secondary | ICD-10-CM | POA: Diagnosis not present

## 2017-11-07 DIAGNOSIS — Z79899 Other long term (current) drug therapy: Secondary | ICD-10-CM | POA: Diagnosis not present

## 2017-11-10 MED FILL — MYCOPHENOLATE 500 MG TABLET: 500 | 30 days supply | Qty: 60 | Fill #0

## 2017-11-17 DIAGNOSIS — G4733 Obstructive sleep apnea (adult) (pediatric): Secondary | ICD-10-CM | POA: Diagnosis not present

## 2017-11-19 ENCOUNTER — Encounter: Payer: Self-pay | Admitting: Family Medicine

## 2017-11-19 ENCOUNTER — Ambulatory Visit (INDEPENDENT_AMBULATORY_CARE_PROVIDER_SITE_OTHER): Payer: 59 | Admitting: Family Medicine

## 2017-11-19 VITALS — BP 110/62 | HR 70 | Temp 98.3°F | Ht 68.0 in | Wt 197.0 lb

## 2017-11-19 DIAGNOSIS — J209 Acute bronchitis, unspecified: Secondary | ICD-10-CM

## 2017-11-19 MED ORDER — BENZONATATE 100 MG PO CAPS
100.0000 mg | ORAL_CAPSULE | Freq: Three times a day (TID) | ORAL | 0 refills | Status: DC | PRN
Start: 2017-11-19 — End: 2018-05-07

## 2017-11-19 MED ORDER — HYDROCODONE-HOMATROPINE 5-1.5 MG/5ML PO SYRP: 5.0000 mL | ORAL_SOLUTION | Freq: Three times a day (TID) | ORAL | 0 refills | Status: DC | PRN

## 2017-11-19 MED FILL — BENZONATATE 100 MG CAPSULE: 100 | 10 days supply | Qty: 30 | Fill #0

## 2017-11-19 MED FILL — HYDROCODONE-HOMATROPINE SOL: 5-1.5 | 8 days supply | Qty: 120 | Fill #0

## 2017-11-19 NOTE — Patient Instructions (Signed)
Take cough syrup at night. Take the pills during the day.  Continue to push fluids, practice good hand hygiene, and cover your mouth if you cough.  If you start having fevers, shaking or shortness of breath, seek immediate care.  Air humidifier, lozenges, Vick's.

## 2017-11-19 NOTE — Progress Notes (Signed)
Pre visit review using our clinic review tool, if applicable. No additional management support is needed unless otherwise documented below in the visit note. 

## 2017-11-19 NOTE — Progress Notes (Signed)
Chief Complaint  Patient presents with  . Cough    congestion    Kathalene Frames [place] here for URI complaints.  Duration: 5 days  Associated symptoms: sinus congestion, rhinorrhea, sore throat and cough Denies: itchy watery eyes, ear pain, ear drainage, wheezing, shortness of breath, myalgia and fevers/rigors Treatment to date: Robitussin DM, antihistamine nasal spray Sick contacts: Yes  ROS:  Const: Denies fevers HEENT: As noted in HPI Lungs: No SOB  Past Medical History:  Diagnosis Date  . Arthritis   . Articular cartilage disease    left shoulder  . Cancer ([place]) 2015   positive prostate cancer bx  . Diverticulitis   . Dyslipidemia (high LDL; low HDL) 08/15/2016  . GERD (gastroesophageal reflux disease)   . Hearing problem    hearing deficit  . Hemorrhoids   . Hypertension   . Hypothyroidism   . Sleep apnea    uses a cpap  . Wears glasses   . Wears hearing aid    both ears   Family History  Problem Relation Age of Onset  . Heart disease Father   . Breast cancer Mother   . Cancer Brother   . Alcohol abuse Other   . Arthritis Other   . Cancer Other        Breast, Prostate  . Coronary artery disease Other   . Irritable bowel syndrome Other   . Cystic fibrosis Other   . Colon cancer Neg Hx     BP 110/62 (BP Location: Left Arm, Patient Position: Sitting, Cuff Size: Large)   Pulse 70   Temp 98.3 F (36.8 C) (Oral)   Ht 5\' 8"  (1.727 m)   Wt 197 lb (89.4 kg)   SpO2 98%   BMI 29.95 kg/m  General: Awake, alert, appears stated age HEENT: AT, [place], ears patent b/l and TM's neg, nares patent w/o discharge, pharynx pink and without exudates, MMM Neck: No masses or asymmetry Heart: RRR, no murmurs, no bruits Lungs: CTAB, no accessory muscle use Psych: Age appropriate judgment and insight, normal mood and affect  Acute bronchitis, unspecified organism - Plan: benzonatate (TESSALON) 100 MG capsule, HYDROcodone-homatropine (HYCODAN) 5-1.5 MG/5ML syrup  Likely viral.  Supportive care. Cough syrup at night only if Tess perles aren't helpful.  Continue to push fluids, practice good hand hygiene, cover mouth when coughing. F/u prn. If starting to experience fevers, shaking, or shortness of breath, seek immediate care. Pt voiced understanding and agreement to the plan.  [place], DO 11/19/17 7:39 AM

## 2017-11-26 MED FILL — MYCOPHENOLATE 500 MG TABLET: 500 | 30 days supply | Qty: 120 | Fill #0

## 2017-12-01 MED FILL — OMEPRAZOLE DR 40 MG CAPSULE: 40 | 30 days supply | Qty: 30 | Fill #3

## 2017-12-08 ENCOUNTER — Other Ambulatory Visit: Payer: Self-pay | Admitting: Family Medicine

## 2017-12-08 MED FILL — PRAVASTATIN NA 40 MG TAB: 40 | 90 days supply | Qty: 45 | Fill #0

## 2017-12-30 MED FILL — OMEPRAZOLE DR 40 MG CAPSULE: 40 | 30 days supply | Qty: 30 | Fill #4

## 2017-12-30 MED FILL — LEVOTHYROXINE 100 MCG TAB: 100 | 90 days supply | Qty: 90 | Fill #1

## 2018-01-01 MED FILL — MYCOPHENOLATE 500 MG TABLET: 500 | 30 days supply | Qty: 120 | Fill #1

## 2018-01-05 MED FILL — LISINOPRIL 5 MG TAB: 5 | 90 days supply | Qty: 90 | Fill #1

## 2018-01-08 DIAGNOSIS — L433 Subacute (active) lichen planus: Secondary | ICD-10-CM | POA: Diagnosis not present

## 2018-01-08 MED FILL — ACITRETIN 25 MG CAP: 25 | 30 days supply | Qty: 30 | Fill #0

## 2018-01-09 DIAGNOSIS — C61 Malignant neoplasm of prostate: Secondary | ICD-10-CM | POA: Diagnosis not present

## 2018-01-15 ENCOUNTER — Ambulatory Visit: Payer: Self-pay | Admitting: Family Medicine

## 2018-01-16 DIAGNOSIS — C61 Malignant neoplasm of prostate: Secondary | ICD-10-CM | POA: Diagnosis not present

## 2018-01-27 ENCOUNTER — Other Ambulatory Visit (HOSPITAL_BASED_OUTPATIENT_CLINIC_OR_DEPARTMENT_OTHER): Payer: Self-pay | Admitting: Pharmacist

## 2018-01-27 ENCOUNTER — Telehealth: Payer: Self-pay

## 2018-01-27 ENCOUNTER — Encounter: Payer: Self-pay | Admitting: Medical

## 2018-01-27 ENCOUNTER — Telehealth: Payer: Self-pay | Admitting: Medical

## 2018-01-27 ENCOUNTER — Ambulatory Visit: Payer: 59 | Admitting: Medical

## 2018-01-27 ENCOUNTER — Ambulatory Visit (HOSPITAL_BASED_OUTPATIENT_CLINIC_OR_DEPARTMENT_OTHER)
Admission: RE | Admit: 2018-01-27 | Discharge: 2018-01-27 | Disposition: A | Discharge status: Home / Self Care | Payer: 59 | Source: Ambulatory Visit | Attending: Medical | Admitting: Medical

## 2018-01-27 VITALS — BP 122/82 | HR 78 | Temp 98.1°F | Resp 16 | Ht 68.0 in | Wt 191.0 lb

## 2018-01-27 DIAGNOSIS — M79672 Pain in left foot: Secondary | ICD-10-CM | POA: Insufficient documentation

## 2018-01-27 DIAGNOSIS — R05 Cough: Secondary | ICD-10-CM | POA: Diagnosis not present

## 2018-01-27 DIAGNOSIS — R6889 Other general symptoms and signs: Secondary | ICD-10-CM | POA: Diagnosis not present

## 2018-01-27 DIAGNOSIS — J029 Acute pharyngitis, unspecified: Secondary | ICD-10-CM | POA: Diagnosis not present

## 2018-01-27 DIAGNOSIS — R059 Cough, unspecified: Secondary | ICD-10-CM

## 2018-01-27 DIAGNOSIS — J0100 Acute maxillary sinusitis, unspecified: Secondary | ICD-10-CM | POA: Diagnosis not present

## 2018-01-27 DIAGNOSIS — M19072 Primary osteoarthritis, left ankle and foot: Secondary | ICD-10-CM | POA: Diagnosis not present

## 2018-01-27 DIAGNOSIS — M79671 Pain in right foot: Secondary | ICD-10-CM

## 2018-01-27 LAB — POCT RAPID STREP A (OFFICE): Rapid Strep A Screen: NEGATIVE

## 2018-01-27 LAB — POCT INFLUENZA A/B
INFLUENZA A, POC: NEGATIVE
Influenza B, POC: NEGATIVE

## 2018-01-27 MED ORDER — DOXYCYCLINE HYCLATE 100 MG PO TABS: 100.0000 mg | ORAL_TABLET | Freq: Two times a day (BID) | ORAL | 0 refills | Status: DC

## 2018-01-27 MED ORDER — FLUTICASONE PROPIONATE 50 MCG/ACT NA SUSP: 2.0000 | Freq: Every day | NASAL | 1 refills | Status: DC

## 2018-01-27 MED FILL — DOXYCYCLINE HYCLATE 100 MG: 100 | 10 days supply | Qty: 20 | Fill #0

## 2018-01-27 MED FILL — FLUTICASONE PROP 50 MCG SPR: 50 | 30 days supply | Qty: 16 | Fill #0

## 2018-01-27 NOTE — Progress Notes (Signed)
Subjective:    Patient ID: Zachary [name], male    DOB: 09-02-1951, 67 y.o.   MRN: [id number]  HPI  Pt in with some severe sinus pressure. Severe pnd. He states 4 days ago he got a scratchy throat.   About one month ago he had similar symptoms and made appointment. He was feeling better that morning  and canceled.   Symptoms flared again as before. He is concerned needs antibiotic.  Pt is blowing out some brown, yellow colored mucus from nose. Little blood tinged mucus.    Review of Systems  Constitutional: Negative for chills, fatigue and fever.  HENT: Positive for congestion, sinus pressure and sinus pain. Negative for sneezing, sore throat and trouble swallowing.   Respiratory: Positive for cough. Negative for chest tightness, wheezing and stridor.   Cardiovascular: Negative for chest pain and palpitations.  Gastrointestinal: Negative for abdominal pain, constipation, nausea and vomiting.  Musculoskeletal: Negative for back pain.       End of exam he did note bilateral feet pain for the past 6 months.  Pain is worse in the morning when he first gets out of bed.  Pain over the entire aspect of bottom of his feet.  But pain seems more prominent in the heel area.  Skin: Negative for rash.  Hematological: Negative for adenopathy. Does not bruise/bleed easily.  Psychiatric/Behavioral: Negative for behavioral problems and confusion.    Past Medical History:  Diagnosis Date  . Arthritis   . Articular cartilage disease    left shoulder  . Cancer ([place]) 2015   positive prostate cancer bx  . Diverticulitis   . Dyslipidemia (high LDL; low HDL) 08/15/2016  . GERD (gastroesophageal reflux disease)   . Hearing problem    hearing deficit  . Hemorrhoids   . Hypertension   . Hypothyroidism   . Sleep apnea    uses a cpap  . Wears glasses   . Wears hearing aid    both ears     Social History   Socioeconomic History  . Marital status: Married    Spouse name: Neoma Laming  .  Number of children: 2  . Years of education: Not on file  . Highest education level: Not on file  Social Needs  . Financial resource strain: Not on file  . Food insecurity - worry: Not on file  . Food insecurity - inability: Not on file  . Transportation needs - medical: Not on file  . Transportation needs - non-medical: Not on file  Occupational History  . 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 Smoking status: Never Smoker  . Smokeless tobacco: Never Used  Substance and Sexual Activity  . Alcohol use: No  . Drug use: No  . Sexual activity: Not on file  Other Topics Concern  . Not on file  Social History Narrative   Previously Chartered loss adjuster [employer]    Past Surgical History:  Procedure Laterality Date  . BACK SURGERY  1978   herniated disksurgery  . CARDIAC CATHETERIZATION  08/01/2010  . COLONOSCOPY    . EXAM UNDER ANESTHESIA WITH MANIPULATION OF SHOULDER Right 09/15/2014   Procedure: RIGHT SHOULDER MANIPULATION UNDER ANESTHESIA;  Surgeon: Ninetta Lights, MD;  Location: [place];  Service: Orthopedics;  Laterality: Right;  . HEMORRHOID SURGERY  08/2006  . SHOULDER ARTHROSCOPY WITH DISTAL CLAVICLE RESECTION Left 07/07/2015   Procedure: SHOULDER  ARTHROSCOPY WITH DISTAL CLAVICLE RESECTION;  Surgeon: Kathryne Hitch, MD;  Location: [place];  Service: Orthopedics;  Laterality: Left;  . SHOULDER ARTHROSCOPY WITH ROTATOR CUFF REPAIR Left 12/07/2015   Procedure: LEFT SHOULDER ARTHROSCOPY DEBRIDEMENT, WITH ROTATOR CUFF REPAIR;  Surgeon: Ninetta Lights, MD;  Location: [place];  Service: Orthopedics;  Laterality: Left;  . SHOULDER ARTHROSCOPY WITH ROTATOR CUFF REPAIR AND SUBACROMIAL DECOMPRESSION Left 07/07/2015   Procedure: LEFT SHOULDER SCOPE DEBRIDEMENT, SUBACROMIAL DECOMPRESSION, DISTAL CLAVICULECTOMY, ROTATOR CUFF REPAIR  ;  Surgeon:  Kathryne Hitch, MD;  Location: [place];  Service: Orthopedics;  Laterality: Left;  ANESTHESIA: GENERAL, PRE/POST OP SCALENE  . SHOULDER ARTHROSCOPY WITH SUBACROMIAL DECOMPRESSION, ROTATOR CUFF REPAIR AND BICEP TENDON REPAIR Right 03/10/2014   Procedure: RIGHT SHOULDER ARTHROSCOPY WITH SUBACROMIAL DECOMPRESSION, PARTIAL ACROMIOPLASTY WITH CORACOAROMIAL LABRUM DEBRIDEMENT RELEASE DISTAL CLAVICULECTOMY,  ROTATOR CUFF REPAIR AND EXTENSIVE DEBRIDEMENT;  Surgeon: Ninetta Lights, MD;  Location: [place];  Service: Orthopedics;  Laterality: Right;  . TENDON REPAIR  June 06, 2011   right elbow, Dr. Percell Miller    Family History  Problem Relation Age of Onset  . Heart disease Father   . Breast cancer Mother   . Cancer Brother   . Alcohol abuse Other   . Arthritis Other   . Cancer Other        Breast, Prostate  . Coronary artery disease Other   . Irritable bowel syndrome Other   . Cystic fibrosis Other   . Colon cancer Neg Hx     Allergies  Allergen Reactions  . Oxycodone Itching  . Simvastatin Other (See Comments)    REACTION: muscle pain    Current Outpatient Medications on File Prior to Visit  Medication Sig Dispense Refill  . acetaminophen (TYLENOL) 500 MG tablet Take 500 mg by mouth every 6 (six) hours as needed.    Marland Kitchen aspirin EC 81 MG EC tablet Take 81 mg by mouth daily.      . benzonatate (TESSALON) 100 MG capsule Take 1 capsule (100 mg total) by mouth 3 (three) times daily as needed. 30 capsule 0  . chlorhexidine (PERIDEX) 0.12 % solution Use as directed 15 mLs in the mouth or throat 2 (two) times daily. "Swish and spit." 473 mL 0  . Cyanocobalamin (VITAMIN B 12 PO) Take 500 mg by mouth daily.    . cyclobenzaprine (FLEXERIL) 5 MG tablet Take 1 tablet (5 mg total) by mouth 3 (three) times daily as needed for muscle spasms. 30 tablet 0  . HYDROcodone-homatropine (HYCODAN) 5-1.5 MG/5ML syrup Take 5 mLs by mouth every 8 (eight) hours as needed for cough. 120  mL 0  . hydrocortisone (ANUSOL-HC) 2.5 % rectal cream Place 1 application rectally at bedtime as needed for hemorrhoids or itching. 30 g 6  . levothyroxine (SYNTHROID, LEVOTHROID) 100 MCG tablet TAKE 1 TABLET BY MOUTH DAILY 90 tablet 1  . lisinopril (PRINIVIL,ZESTRIL) 5 MG tablet TAKE 1 TABLET (5 MG TOTAL) BY MOUTH DAILY. 90 tablet 1  . loratadine (CLARITIN) 10 MG tablet Take 10 mg by mouth daily.      . Melatonin 5 MG CAPS Take 2 capsules by mouth daily.    . naproxen (EC NAPROSYN) 500 MG EC tablet Take 1 tablet (500 mg total) by mouth 2 (two) times daily with a meal. 30 tablet 0  . omeprazole (PRILOSEC) 40 MG capsule TAKE 1 CAPSULE (40 MG TOTAL) BY MOUTH DAILY. 30 capsule 5  . pravastatin (PRAVACHOL)  40 MG tablet TAKE 1/2 TABLET (20 MG TOTAL) BY MOUTH DAILY. 45 tablet 2  . sodium bicarbonate 650 MG tablet Take 1 tablet (650 mg total) by mouth daily. 90 tablet 1  . fexofenadine (ALLEGRA) 180 MG tablet Take 1 tablet (180 mg total) by mouth daily. 20 tablet 0  . ranitidine (ZANTAC) 150 MG tablet Take 1 tablet (150 mg total) by mouth 2 (two) times daily. 14 tablet 0   Current Facility-Administered Medications on File Prior to Visit  Medication Dose Route Frequency Provider Last Rate Last Dose  . 0.9 %  sodium chloride infusion  500 mL Intravenous Continuous Irene Shipper, MD        BP 122/82 (BP Location: Left Arm, Patient Position: Sitting, Cuff Size: Small)   Pulse 78   Temp 98.1 F (36.7 C) (Oral)   Resp 16   Ht 5\' 8"  (1.727 m)   Wt 191 lb (86.6 kg)   SpO2 97%   BMI 29.04 kg/m       Objective:   Physical Exam  General  Mental Status - Alert. General Appearance - Well groomed. Not in acute distress.  Skin Rashes- No Rashes.  HEENT Head- Normal. Ear Auditory Canal - Left- Normal. Right - Normal.Tympanic Membrane- Left- Normal. Right- Normal. Eye Sclera/Conjunctiva- Left- Normal. Right- Normal. Nose & Sinuses Nasal Mucosa- Left-  Boggy and Congested. Right-  Boggy and   Congested.Bilateral maxillary but no  sinus pressure. Mouth & Throat Lips: Upper Lip- Normal: no dryness, cracking, pallor, cyanosis, or vesicular eruption. Lower Lip-Normal: no dryness, cracking, pallor, cyanosis or vesicular eruption. Buccal Mucosa- Bilateral- No Aphthous ulcers. Oropharynx- No Discharge or Erythema. Tonsils: Characteristics- Bilateral- No Erythema or Congestion. Size/Enlargement- Bilateral- No enlargement. Discharge- bilateral-None.  Neck Neck- Supple. No Masses.   Chest and Lung Exam Auscultation: Breath Sounds:-Clear even and unlabored.  Cardiovascular Auscultation:Rythm- Regular, rate and rhythm. Murmurs & Other Heart Sounds:Ausculatation of the heart reveal- No Murmurs.  Lymphatic Head & Neck General Head & Neck Lymphatics: Bilateral: Description- No Localized lymphadenopathy.  Bilateral feet exam.  Tenderness to palpation on both bottom aspect.  Pain easily reproduced on palpation over the calcaneus regions.      Assessment & Plan:  You appear to have a sinus infection. I am prescribing doxycycline antibiotic for the infection. To help with the nasal congestion I prescribed  flonase nasal steroid. For your associated cough, can use delsym. If cough worsens notify us and will rx stronger med.  Rest, hydrate, tylenol for fever.  Follow up in 7 days or as needed.    Note did discuss with patient hat we will get x-rays of both feet today.  We will see if x-rays show any evidence of spurs.  I did mention possible referral to podiatrist since his inserts he got over-the-counter are not helping.  Mackie Pai, PA-C

## 2018-01-27 NOTE — Patient Instructions (Addendum)
You appear to have a sinus infection. I am prescribing doxycycline antibiotic for the infection. To help with the nasal congestion I prescribed  flonase nasal steroid. For your associated cough, can use delsym. If cough worsens notify us and will rx stronger med.  Rest, hydrate, tylenol for fever.  Follow up in 7 days or as needed.  Note did discuss with patient hat we will get x-rays of both feet today.  We will see if x-rays show any evidence of spurs.  I did mention possible referral to podiatrist since his inserts he got over-the-counter are not helping.

## 2018-01-27 NOTE — Telephone Encounter (Signed)
Zachary [name] [birth year] was prescribed Doxycycline today. He is on Acetretin which is a class X drug interaction with doxycycline. We may want to 1) add the acetretin to his historical med list in Epic (he takes 25 mg daily) and 2) consider another agent in place of doxycycline    This is a note I got from the [employer]. Could you please advise?

## 2018-01-27 NOTE — Telephone Encounter (Signed)
Referral to podiatrist placed

## 2018-01-27 NOTE — Telephone Encounter (Signed)
Please find out his dosing and do not fill Doxy. Let's do Augmentin 875-125 mg, 1 tab BID for 10 days instead and update his med list. TY.

## 2018-01-28 MED ORDER — AMOXICILLIN-POT CLAVULANATE 875-125 MG PO TABS: 1.0000 | ORAL_TABLET | Freq: Two times a day (BID) | ORAL | 0 refills | Status: DC

## 2018-01-28 MED FILL — AMOX-CLAV 875-125 MG TABLET: 875-125 | 10 days supply | Qty: 20 | Fill #0

## 2018-01-28 NOTE — Telephone Encounter (Signed)
The following is an advised warning by PharmD Luetta Nutting.    (in hopes of alerting any future interactions)   The Rx Acitretin 25 mg cap once daily has No side effect, just a drug interaction  potential.   He is taking the Acitretin regularly without any problems.   But the medication has a TON of potential drug interactions.... and if it's not in EPIC, they won't be detected when anyone goes to prescribe it....  it's written by a NON-Epic  provider.  prescriber on the Acitretin is Harriett Sine    I Carolee Rota) added a note, so that anyone who accesses the chart should be able to see it as soon as the chart is opened.

## 2018-01-28 NOTE — Telephone Encounter (Signed)
Doxy discontinued on Pt's med list. Pt was taking 100mg  BID. Rx for Augmentin was sent in to med center and message was sent to pharmacist.

## 2018-01-28 NOTE — Addendum Note (Signed)
Addended by: Roma Kayser on: 01/28/2018 11:01 AM   Modules accepted: Orders

## 2018-01-29 DIAGNOSIS — C61 Malignant neoplasm of prostate: Secondary | ICD-10-CM | POA: Diagnosis not present

## 2018-02-02 MED FILL — OMEPRAZOLE DR 40 MG CAPSULE: 40 | 30 days supply | Qty: 30 | Fill #5

## 2018-02-04 DIAGNOSIS — C61 Malignant neoplasm of prostate: Secondary | ICD-10-CM | POA: Diagnosis not present

## 2018-02-05 ENCOUNTER — Ambulatory Visit: Payer: 59 | Admitting: Podiatry

## 2018-02-05 ENCOUNTER — Encounter: Payer: Self-pay | Admitting: Podiatry

## 2018-02-05 DIAGNOSIS — M722 Plantar fascial fibromatosis: Secondary | ICD-10-CM

## 2018-02-05 MED ORDER — MELOXICAM 15 MG PO TABS: 15.0000 mg | ORAL_TABLET | Freq: Every day | ORAL | 3 refills | Status: DC

## 2018-02-05 MED ORDER — METHYLPREDNISOLONE 4 MG PO TBPK: ORAL_TABLET | ORAL | 0 refills | Status: DC

## 2018-02-05 MED FILL — MELOXICAM 15 MG TABLET: 15 | 30 days supply | Qty: 30 | Fill #0

## 2018-02-05 MED FILL — METHYLPREDNISOLONE 4 MG TAB: 4 | 6 days supply | Qty: 21 | Fill #0

## 2018-02-05 NOTE — Progress Notes (Signed)
Subjective:  Patient ID: Zachary [name], male    DOB: [birth year],  MRN: [id number] HPI Chief Complaint  Patient presents with  . Foot Pain    Plantar heel bilateral (L>R) - aching x few months, AM pain, PCP xrayed - said had spurs, no treatment, referred here    67 y.o. male presents with the above complaint.     Past Medical History:  Diagnosis Date  . Arthritis   . Articular cartilage disease    left shoulder  . Cancer ([place]) 2015   positive prostate cancer bx  . Diverticulitis   . Dyslipidemia (high LDL; low HDL) 08/15/2016  . GERD (gastroesophageal reflux disease)   . Hearing problem    hearing deficit  . Hemorrhoids   . Hypertension   . Hypothyroidism   . Sleep apnea    uses a cpap  . Wears glasses   . Wears hearing aid    both ears   Past Surgical History:  Procedure Laterality Date  . BACK SURGERY  1978   herniated disksurgery  . CARDIAC CATHETERIZATION  08/01/2010  . COLONOSCOPY    . EXAM UNDER ANESTHESIA WITH MANIPULATION OF SHOULDER Right 09/15/2014   Procedure: RIGHT SHOULDER MANIPULATION UNDER ANESTHESIA;  Surgeon: Ninetta Lights, MD;  Location: [place];  Service: Orthopedics;  Laterality: Right;  . HEMORRHOID SURGERY  08/2006  . SHOULDER ARTHROSCOPY WITH DISTAL CLAVICLE RESECTION Left 07/07/2015   Procedure: SHOULDER ARTHROSCOPY WITH DISTAL CLAVICLE RESECTION;  Surgeon: Kathryne Hitch, MD;  Location: [place];  Service: Orthopedics;  Laterality: Left;  . SHOULDER ARTHROSCOPY WITH ROTATOR CUFF REPAIR Left 12/07/2015   Procedure: LEFT SHOULDER ARTHROSCOPY DEBRIDEMENT, WITH ROTATOR CUFF REPAIR;  Surgeon: Ninetta Lights, MD;  Location: [place];  Service: Orthopedics;  Laterality: Left;  . SHOULDER ARTHROSCOPY WITH ROTATOR CUFF REPAIR AND SUBACROMIAL DECOMPRESSION Left 07/07/2015   Procedure: LEFT SHOULDER SCOPE DEBRIDEMENT, SUBACROMIAL DECOMPRESSION, DISTAL CLAVICULECTOMY, ROTATOR CUFF REPAIR  ;  Surgeon:  Kathryne Hitch, MD;  Location: [place];  Service: Orthopedics;  Laterality: Left;  ANESTHESIA: GENERAL, PRE/POST OP SCALENE  . SHOULDER ARTHROSCOPY WITH SUBACROMIAL DECOMPRESSION, ROTATOR CUFF REPAIR AND BICEP TENDON REPAIR Right 03/10/2014   Procedure: RIGHT SHOULDER ARTHROSCOPY WITH SUBACROMIAL DECOMPRESSION, PARTIAL ACROMIOPLASTY WITH CORACOAROMIAL LABRUM DEBRIDEMENT RELEASE DISTAL CLAVICULECTOMY,  ROTATOR CUFF REPAIR AND EXTENSIVE DEBRIDEMENT;  Surgeon: Ninetta Lights, MD;  Location: [place];  Service: Orthopedics;  Laterality: Right;  . TENDON REPAIR  June 06, 2011   right elbow, Dr. Percell Miller    Current Outpatient Medications:  .  acetaminophen (TYLENOL) 500 MG tablet, Take 500 mg by mouth every 6 (six) hours as needed., Disp: , Rfl:  .  amoxicillin-clavulanate (AUGMENTIN) 875-125 MG tablet, Take 1 tablet by mouth 2 (two) times daily., Disp: 20 tablet, Rfl: 0 .  aspirin EC 81 MG EC tablet, Take 81 mg by mouth daily.  , Disp: , Rfl:  .  benzonatate (TESSALON) 100 MG capsule, Take 1 capsule (100 mg total) by mouth 3 (three) times daily as needed., Disp: 30 capsule, Rfl: 0 .  chlorhexidine (PERIDEX) 0.12 % solution, Use as directed 15 mLs in the mouth or throat 2 (two) times daily. "Swish and spit.", Disp: 473 mL, Rfl: 0 .  Cyanocobalamin (VITAMIN B 12 PO), Take 500 mg by mouth daily., Disp: , Rfl:  .  cyclobenzaprine (FLEXERIL) 5 MG tablet, Take 1 tablet (5 mg total) by mouth 3 (three) times daily as needed for  muscle spasms., Disp: 30 tablet, Rfl: 0 .  fexofenadine (ALLEGRA) 180 MG tablet, Take 1 tablet (180 mg total) by mouth daily., Disp: 20 tablet, Rfl: 0 .  fluticasone (FLONASE) 50 MCG/ACT nasal spray, Place 2 sprays into both nostrils daily., Disp: 16 g, Rfl: 1 .  HYDROcodone-homatropine (HYCODAN) 5-1.5 MG/5ML syrup, Take 5 mLs by mouth every 8 (eight) hours as needed for cough., Disp: 120 mL, Rfl: 0 .  hydrocortisone (ANUSOL-HC) 2.5 % rectal cream, Place 1  application rectally at bedtime as needed for hemorrhoids or itching., Disp: 30 g, Rfl: 6 .  levothyroxine (SYNTHROID, LEVOTHROID) 100 MCG tablet, TAKE 1 TABLET BY MOUTH DAILY, Disp: 90 tablet, Rfl: 1 .  lisinopril (PRINIVIL,ZESTRIL) 5 MG tablet, TAKE 1 TABLET (5 MG TOTAL) BY MOUTH DAILY., Disp: 90 tablet, Rfl: 1 .  loratadine (CLARITIN) 10 MG tablet, Take 10 mg by mouth daily.  , Disp: , Rfl:  .  Melatonin 5 MG CAPS, Take 2 capsules by mouth daily., Disp: , Rfl:  .  naproxen (EC NAPROSYN) 500 MG EC tablet, Take 1 tablet (500 mg total) by mouth 2 (two) times daily with a meal., Disp: 30 tablet, Rfl: 0 .  omeprazole (PRILOSEC) 40 MG capsule, TAKE 1 CAPSULE (40 MG TOTAL) BY MOUTH DAILY., Disp: 30 capsule, Rfl: 5 .  pravastatin (PRAVACHOL) 40 MG tablet, TAKE 1/2 TABLET (20 MG TOTAL) BY MOUTH DAILY., Disp: 45 tablet, Rfl: 2 .  ranitidine (ZANTAC) 150 MG tablet, Take 1 tablet (150 mg total) by mouth 2 (two) times daily., Disp: 14 tablet, Rfl: 0 .  sodium bicarbonate 650 MG tablet, Take 1 tablet (650 mg total) by mouth daily., Disp: 90 tablet, Rfl: 1  Current Facility-Administered Medications:  .  0.9 %  sodium chloride infusion, 500 mL, Intravenous, Continuous, Irene Shipper, MD  Allergies  Allergen Reactions  . Oxycodone Itching  . Simvastatin Other (See Comments)    REACTION: muscle pain   Review of Systems  All other systems reviewed and are negative.  Objective:  There were no vitals filed for this visit.  General: Well developed, nourished, in no acute distress, alert and oriented x3   Dermatological: Skin is warm, dry and supple bilateral. Nails x 10 are well maintained; remaining integument appears unremarkable at this time. There are no open sores, no preulcerative lesions, no rash or signs of infection present.  Vascular: Dorsalis Pedis artery and Posterior Tibial artery pedal pulses are 2/4 bilateral with immedate capillary fill time. Pedal hair growth present. No varicosities and  no lower extremity edema present bilateral.   Neruologic: Grossly intact via light touch bilateral. Vibratory intact via tuning fork bilateral. Protective threshold with Semmes Wienstein monofilament intact to all pedal sites bilateral. Patellar and Achilles deep tendon reflexes 2+ bilateral. No Babinski or clonus noted bilateral.   Musculoskeletal: No gross boney pedal deformities bilateral. No pain, crepitus, or limitation noted with foot and ankle range of motion bilateral. Muscular strength 5/5 in all groups tested bilateral.  Pain on palpation medial calcaneal tubercles bilateral left greater than right.  Gait: Unassisted, Nonantalgic.    Radiographs:  Reviewed radiographs in the chart which do demonstrate a plantar distally oriented calcaneal heel spur on the left side but not the right side.  Soft tissue increase in density is noted only at the plantar fascial cranial insertion site.  No fractures or acute findings are noted.  Assessment & Plan:   Assessment: Plantar fasciitis bilateral.  Plan: Discussed etiology pathology conservative versus surgical therapies.  At this point I injected the bilateral heels today with Kenalog and local anesthetic 20 mg and 5 mg respectively.  He tolerated this procedure well without complications.  Placed in a plantar fascial braces and a single night splint.  Started him on a Medrol Dosepak to be followed by meloxicam.  Discussed appropriate shoe gear stretching exercises ice therapy as your modifications I want to follow-up with him in 1 month.     Kiegan Macaraeg T. [place], [place]

## 2018-02-05 NOTE — Patient Instructions (Signed)

## 2018-02-11 DIAGNOSIS — C61 Malignant neoplasm of prostate: Secondary | ICD-10-CM | POA: Diagnosis not present

## 2018-02-11 DIAGNOSIS — Z510 Encounter for antineoplastic radiation therapy: Secondary | ICD-10-CM | POA: Diagnosis not present

## 2018-02-16 DIAGNOSIS — C61 Malignant neoplasm of prostate: Secondary | ICD-10-CM | POA: Diagnosis not present

## 2018-02-16 DIAGNOSIS — Z510 Encounter for antineoplastic radiation therapy: Secondary | ICD-10-CM | POA: Diagnosis not present

## 2018-02-17 DIAGNOSIS — Z510 Encounter for antineoplastic radiation therapy: Secondary | ICD-10-CM | POA: Diagnosis not present

## 2018-02-17 DIAGNOSIS — C61 Malignant neoplasm of prostate: Secondary | ICD-10-CM | POA: Diagnosis not present

## 2018-02-18 DIAGNOSIS — Z510 Encounter for antineoplastic radiation therapy: Secondary | ICD-10-CM | POA: Diagnosis not present

## 2018-02-18 DIAGNOSIS — C61 Malignant neoplasm of prostate: Secondary | ICD-10-CM | POA: Diagnosis not present

## 2018-02-19 DIAGNOSIS — C61 Malignant neoplasm of prostate: Secondary | ICD-10-CM | POA: Diagnosis not present

## 2018-02-19 DIAGNOSIS — Z510 Encounter for antineoplastic radiation therapy: Secondary | ICD-10-CM | POA: Diagnosis not present

## 2018-02-20 DIAGNOSIS — Z510 Encounter for antineoplastic radiation therapy: Secondary | ICD-10-CM | POA: Diagnosis not present

## 2018-02-20 DIAGNOSIS — C61 Malignant neoplasm of prostate: Secondary | ICD-10-CM | POA: Diagnosis not present

## 2018-02-23 DIAGNOSIS — Z510 Encounter for antineoplastic radiation therapy: Secondary | ICD-10-CM | POA: Diagnosis not present

## 2018-02-23 DIAGNOSIS — C61 Malignant neoplasm of prostate: Secondary | ICD-10-CM | POA: Diagnosis not present

## 2018-02-24 DIAGNOSIS — Z510 Encounter for antineoplastic radiation therapy: Secondary | ICD-10-CM | POA: Diagnosis not present

## 2018-02-24 DIAGNOSIS — C61 Malignant neoplasm of prostate: Secondary | ICD-10-CM | POA: Diagnosis not present

## 2018-02-24 DIAGNOSIS — G4733 Obstructive sleep apnea (adult) (pediatric): Secondary | ICD-10-CM | POA: Diagnosis not present

## 2018-02-25 DIAGNOSIS — Z510 Encounter for antineoplastic radiation therapy: Secondary | ICD-10-CM | POA: Diagnosis not present

## 2018-02-25 DIAGNOSIS — C61 Malignant neoplasm of prostate: Secondary | ICD-10-CM | POA: Diagnosis not present

## 2018-02-26 DIAGNOSIS — Z510 Encounter for antineoplastic radiation therapy: Secondary | ICD-10-CM | POA: Diagnosis not present

## 2018-02-26 DIAGNOSIS — C61 Malignant neoplasm of prostate: Secondary | ICD-10-CM | POA: Diagnosis not present

## 2018-02-27 DIAGNOSIS — Z510 Encounter for antineoplastic radiation therapy: Secondary | ICD-10-CM | POA: Diagnosis not present

## 2018-02-27 DIAGNOSIS — C61 Malignant neoplasm of prostate: Secondary | ICD-10-CM | POA: Diagnosis not present

## 2018-03-02 ENCOUNTER — Other Ambulatory Visit: Payer: Self-pay | Admitting: Family Medicine

## 2018-03-02 DIAGNOSIS — K219 Gastro-esophageal reflux disease without esophagitis: Secondary | ICD-10-CM

## 2018-03-02 DIAGNOSIS — C61 Malignant neoplasm of prostate: Secondary | ICD-10-CM | POA: Diagnosis not present

## 2018-03-02 DIAGNOSIS — Z510 Encounter for antineoplastic radiation therapy: Secondary | ICD-10-CM | POA: Diagnosis not present

## 2018-03-02 MED FILL — OMEPRAZOLE DR 40 MG CAPSULE: 40 | 30 days supply | Qty: 30 | Fill #0

## 2018-03-03 DIAGNOSIS — Z510 Encounter for antineoplastic radiation therapy: Secondary | ICD-10-CM | POA: Diagnosis not present

## 2018-03-03 DIAGNOSIS — C61 Malignant neoplasm of prostate: Secondary | ICD-10-CM | POA: Diagnosis not present

## 2018-03-04 DIAGNOSIS — C61 Malignant neoplasm of prostate: Secondary | ICD-10-CM | POA: Diagnosis not present

## 2018-03-04 DIAGNOSIS — Z510 Encounter for antineoplastic radiation therapy: Secondary | ICD-10-CM | POA: Diagnosis not present

## 2018-03-05 DIAGNOSIS — Z510 Encounter for antineoplastic radiation therapy: Secondary | ICD-10-CM | POA: Diagnosis not present

## 2018-03-05 DIAGNOSIS — C61 Malignant neoplasm of prostate: Secondary | ICD-10-CM | POA: Diagnosis not present

## 2018-03-06 DIAGNOSIS — Z510 Encounter for antineoplastic radiation therapy: Secondary | ICD-10-CM | POA: Diagnosis not present

## 2018-03-06 DIAGNOSIS — H5203 Hypermetropia, bilateral: Secondary | ICD-10-CM | POA: Diagnosis not present

## 2018-03-06 DIAGNOSIS — H524 Presbyopia: Secondary | ICD-10-CM | POA: Diagnosis not present

## 2018-03-06 DIAGNOSIS — H52223 Regular astigmatism, bilateral: Secondary | ICD-10-CM | POA: Diagnosis not present

## 2018-03-06 DIAGNOSIS — C61 Malignant neoplasm of prostate: Secondary | ICD-10-CM | POA: Diagnosis not present

## 2018-03-09 DIAGNOSIS — C61 Malignant neoplasm of prostate: Secondary | ICD-10-CM | POA: Diagnosis not present

## 2018-03-09 DIAGNOSIS — Z510 Encounter for antineoplastic radiation therapy: Secondary | ICD-10-CM | POA: Diagnosis not present

## 2018-03-09 MED FILL — PRAVASTATIN NA 40 MG TAB: 40 | 90 days supply | Qty: 45 | Fill #1

## 2018-03-10 DIAGNOSIS — C61 Malignant neoplasm of prostate: Secondary | ICD-10-CM | POA: Diagnosis not present

## 2018-03-10 DIAGNOSIS — Z510 Encounter for antineoplastic radiation therapy: Secondary | ICD-10-CM | POA: Diagnosis not present

## 2018-03-11 DIAGNOSIS — C61 Malignant neoplasm of prostate: Secondary | ICD-10-CM | POA: Diagnosis not present

## 2018-03-11 DIAGNOSIS — Z510 Encounter for antineoplastic radiation therapy: Secondary | ICD-10-CM | POA: Diagnosis not present

## 2018-03-12 DIAGNOSIS — C61 Malignant neoplasm of prostate: Secondary | ICD-10-CM | POA: Diagnosis not present

## 2018-03-12 DIAGNOSIS — Z510 Encounter for antineoplastic radiation therapy: Secondary | ICD-10-CM | POA: Diagnosis not present

## 2018-03-13 DIAGNOSIS — Z510 Encounter for antineoplastic radiation therapy: Secondary | ICD-10-CM | POA: Diagnosis not present

## 2018-03-13 DIAGNOSIS — C61 Malignant neoplasm of prostate: Secondary | ICD-10-CM | POA: Diagnosis not present

## 2018-03-16 DIAGNOSIS — Z510 Encounter for antineoplastic radiation therapy: Secondary | ICD-10-CM | POA: Diagnosis not present

## 2018-03-16 DIAGNOSIS — C61 Malignant neoplasm of prostate: Secondary | ICD-10-CM | POA: Diagnosis not present

## 2018-03-17 DIAGNOSIS — Z510 Encounter for antineoplastic radiation therapy: Secondary | ICD-10-CM | POA: Diagnosis not present

## 2018-03-17 DIAGNOSIS — C61 Malignant neoplasm of prostate: Secondary | ICD-10-CM | POA: Diagnosis not present

## 2018-03-18 DIAGNOSIS — C61 Malignant neoplasm of prostate: Secondary | ICD-10-CM | POA: Diagnosis not present

## 2018-03-18 DIAGNOSIS — Z510 Encounter for antineoplastic radiation therapy: Secondary | ICD-10-CM | POA: Diagnosis not present

## 2018-03-18 MED FILL — TAMSULOSIN HCL 0.4 MG CAP: 0.4 | 30 days supply | Qty: 30 | Fill #0

## 2018-03-19 DIAGNOSIS — C61 Malignant neoplasm of prostate: Secondary | ICD-10-CM | POA: Diagnosis not present

## 2018-03-19 DIAGNOSIS — Z510 Encounter for antineoplastic radiation therapy: Secondary | ICD-10-CM | POA: Diagnosis not present

## 2018-03-20 DIAGNOSIS — Z510 Encounter for antineoplastic radiation therapy: Secondary | ICD-10-CM | POA: Diagnosis not present

## 2018-03-20 DIAGNOSIS — C61 Malignant neoplasm of prostate: Secondary | ICD-10-CM | POA: Diagnosis not present

## 2018-03-23 DIAGNOSIS — Z510 Encounter for antineoplastic radiation therapy: Secondary | ICD-10-CM | POA: Diagnosis not present

## 2018-03-23 DIAGNOSIS — C61 Malignant neoplasm of prostate: Secondary | ICD-10-CM | POA: Diagnosis not present

## 2018-03-24 DIAGNOSIS — C61 Malignant neoplasm of prostate: Secondary | ICD-10-CM | POA: Diagnosis not present

## 2018-03-24 DIAGNOSIS — Z510 Encounter for antineoplastic radiation therapy: Secondary | ICD-10-CM | POA: Diagnosis not present

## 2018-03-25 DIAGNOSIS — Z510 Encounter for antineoplastic radiation therapy: Secondary | ICD-10-CM | POA: Diagnosis not present

## 2018-03-25 DIAGNOSIS — C61 Malignant neoplasm of prostate: Secondary | ICD-10-CM | POA: Diagnosis not present

## 2018-03-26 DIAGNOSIS — Z510 Encounter for antineoplastic radiation therapy: Secondary | ICD-10-CM | POA: Diagnosis not present

## 2018-03-26 DIAGNOSIS — C61 Malignant neoplasm of prostate: Secondary | ICD-10-CM | POA: Diagnosis not present

## 2018-03-27 DIAGNOSIS — Z510 Encounter for antineoplastic radiation therapy: Secondary | ICD-10-CM | POA: Diagnosis not present

## 2018-03-27 DIAGNOSIS — C61 Malignant neoplasm of prostate: Secondary | ICD-10-CM | POA: Diagnosis not present

## 2018-03-28 DIAGNOSIS — C61 Malignant neoplasm of prostate: Secondary | ICD-10-CM | POA: Diagnosis not present

## 2018-03-28 DIAGNOSIS — Z510 Encounter for antineoplastic radiation therapy: Secondary | ICD-10-CM | POA: Diagnosis not present

## 2018-03-30 ENCOUNTER — Other Ambulatory Visit: Payer: Self-pay | Admitting: Family Medicine

## 2018-03-30 DIAGNOSIS — C61 Malignant neoplasm of prostate: Secondary | ICD-10-CM | POA: Diagnosis not present

## 2018-03-30 DIAGNOSIS — Z510 Encounter for antineoplastic radiation therapy: Secondary | ICD-10-CM | POA: Diagnosis not present

## 2018-03-30 MED FILL — LEVOTHYROXINE 100 MCG TAB: 100 | 90 days supply | Qty: 90 | Fill #0

## 2018-03-31 DIAGNOSIS — Z510 Encounter for antineoplastic radiation therapy: Secondary | ICD-10-CM | POA: Diagnosis not present

## 2018-03-31 DIAGNOSIS — C61 Malignant neoplasm of prostate: Secondary | ICD-10-CM | POA: Diagnosis not present

## 2018-04-01 DIAGNOSIS — C61 Malignant neoplasm of prostate: Secondary | ICD-10-CM | POA: Diagnosis not present

## 2018-04-01 DIAGNOSIS — Z510 Encounter for antineoplastic radiation therapy: Secondary | ICD-10-CM | POA: Diagnosis not present

## 2018-04-02 ENCOUNTER — Ambulatory Visit: Payer: 59 | Admitting: Podiatry

## 2018-04-02 DIAGNOSIS — C61 Malignant neoplasm of prostate: Secondary | ICD-10-CM | POA: Diagnosis not present

## 2018-04-02 DIAGNOSIS — Z510 Encounter for antineoplastic radiation therapy: Secondary | ICD-10-CM | POA: Diagnosis not present

## 2018-04-06 ENCOUNTER — Other Ambulatory Visit: Payer: Self-pay | Admitting: Family Medicine

## 2018-04-06 DIAGNOSIS — I10 Essential (primary) hypertension: Secondary | ICD-10-CM

## 2018-04-06 MED FILL — OMEPRAZOLE DR 40 MG CAPSULE: 40 | 30 days supply | Qty: 30 | Fill #1

## 2018-04-06 MED FILL — LISINOPRIL 5 MG TABLET: 5 | 90 days supply | Qty: 90 | Fill #0

## 2018-04-10 MED FILL — TAMSULOSIN HCL 0.4 MG CAP: 0.4 | 30 days supply | Qty: 60 | Fill #0

## 2018-04-29 ENCOUNTER — Other Ambulatory Visit: Payer: Self-pay

## 2018-04-29 ENCOUNTER — Emergency Department
Admission: EM | Admit: 2018-04-29 | Discharge: 2018-04-29 | Disposition: A | Discharge status: Home / Self Care | Payer: 59 | Source: Home / Self Care | Attending: Family Medicine | Admitting: Family Medicine

## 2018-04-29 DIAGNOSIS — M545 Low back pain, unspecified: Secondary | ICD-10-CM

## 2018-04-29 MED ORDER — TRAMADOL HCL 50 MG PO TABS: 50.0000 mg | ORAL_TABLET | Freq: Four times a day (QID) | ORAL | 0 refills | Status: DC | PRN

## 2018-04-29 MED ORDER — CYCLOBENZAPRINE HCL 10 MG PO TABS: 10.0000 mg | ORAL_TABLET | Freq: Two times a day (BID) | ORAL | 0 refills | Status: DC | PRN

## 2018-04-29 MED FILL — traMADol HCL 50 MG TABS: 50 | 3 days supply | Qty: 15 | Fill #0

## 2018-04-29 MED FILL — CYCLOBENZAPRINE HCL 10 MG T: 10 | 10 days supply | Qty: 20 | Fill #0

## 2018-04-29 NOTE — ED Provider Notes (Signed)
[name]    CSN: [id number] Arrival date & time: 04/29/18  1019     History   Chief Complaint Chief Complaint  Patient presents with  . Back Pain    HPI Zachary [name] is a 67 y.o. male.   HPI  Zachary [name] is a 67 y.o. male presenting to UC with c/o Left lower back pain and muscle spasm that started yesterday around 4:30AM.  Pain subsided some with heat and Advil yesterday but the spasm occurred again later in the day. Pain is now worse despite using a TENS unit, thermaxcare heat, Aleve and one leftover tramadol.  No specific injury. Pt notes he gets similar back spasms at least once a year.  Denies urinary symptoms.  No radiation of pain or numbness in arms or legs.    Past Medical History:  Diagnosis Date  . Arthritis   . Articular cartilage disease    left shoulder  . Cancer ([place]) 2015   positive prostate cancer bx  . Diverticulitis   . Dyslipidemia (high LDL; low HDL) 08/15/2016  . GERD (gastroesophageal reflux disease)   . Hearing problem    hearing deficit  . Hemorrhoids   . Hypertension   . Hypothyroidism   . Sleep apnea    uses a cpap  . Wears glasses   . Wears hearing aid    both ears    Patient Active Problem List   Diagnosis Date Noted  . Stomatitis 04/17/2017  . Dyslipidemia (high LDL; low HDL) 08/15/2016  . S/P arthroscopy of shoulder 05/31/2016  . History of prostate cancer 05/09/2016  . Complete rotator cuff tear of left shoulder 07/05/2015  . Tennis elbow 05/30/2014  . Rotator cuff (capsule) sprain 03/10/2014  . Right shoulder pain 02/11/2014  . Memory difficulty 12/18/2012  . Gynecomastia, male 07/30/2012  . OSA (obstructive sleep apnea) 04/23/2011  . EXTERNAL HEMORRHOIDS 02/12/2011  . Prostate cancer ([place]) 11/12/2010  . PLANTAR FASCIITIS 04/09/2010  . Hypothyroidism 12/08/2009  . NEOPLASM OF UNCERTAIN BEHAVIOR OF SKIN 03/10/2009  . OSTEOARTHRITIS 02/14/2009  . HEARING DEFICIT 02/08/2009  . Essential hypertension  02/08/2009  . Allergic rhinitis 02/08/2009  . GERD 02/08/2009  . PROTEINURIA 02/08/2009  . DIVERTICULITIS, HX OF 02/08/2009    Past Surgical History:  Procedure Laterality Date  . BACK SURGERY  1978   herniated disksurgery  . CARDIAC CATHETERIZATION  08/01/2010  . COLONOSCOPY    . EXAM UNDER ANESTHESIA WITH MANIPULATION OF SHOULDER Right 09/15/2014   Procedure: RIGHT SHOULDER MANIPULATION UNDER ANESTHESIA;  Surgeon: Ninetta Lights, MD;  Location: [facility];  Service: Orthopedics;  Laterality: Right;  . HEMORRHOID SURGERY  08/2006  . SHOULDER ARTHROSCOPY WITH DISTAL CLAVICLE RESECTION Left 07/07/2015   Procedure: SHOULDER ARTHROSCOPY WITH DISTAL CLAVICLE RESECTION;  Surgeon: Kathryne Hitch, MD;  Location: [facility];  Service: Orthopedics;  Laterality: Left;  . SHOULDER ARTHROSCOPY WITH ROTATOR CUFF REPAIR Left 12/07/2015   Procedure: LEFT SHOULDER ARTHROSCOPY DEBRIDEMENT, WITH ROTATOR CUFF REPAIR;  Surgeon: Ninetta Lights, MD;  Location: [facility] [place];  Service: Orthopedics;  Laterality: Left;  . SHOULDER ARTHROSCOPY WITH ROTATOR CUFF REPAIR AND SUBACROMIAL DECOMPRESSION Left 07/07/2015   Procedure: LEFT SHOULDER SCOPE DEBRIDEMENT, SUBACROMIAL DECOMPRESSION, DISTAL CLAVICULECTOMY, ROTATOR CUFF REPAIR  ;  Surgeon: Kathryne Hitch, MD;  Location: [place];  Service: Orthopedics;  Laterality: Left;  ANESTHESIA: GENERAL, PRE/POST OP SCALENE  . SHOULDER ARTHROSCOPY WITH SUBACROMIAL DECOMPRESSION, ROTATOR CUFF REPAIR AND BICEP TENDON REPAIR  Right 03/10/2014   Procedure: RIGHT SHOULDER ARTHROSCOPY WITH SUBACROMIAL DECOMPRESSION, PARTIAL ACROMIOPLASTY WITH CORACOAROMIAL LABRUM DEBRIDEMENT RELEASE DISTAL CLAVICULECTOMY,  ROTATOR CUFF REPAIR AND EXTENSIVE DEBRIDEMENT;  Surgeon: Ninetta Lights, MD;  Location: [place] [facility];  Service: Orthopedics;  Laterality: Right;  . TENDON REPAIR  June 06, 2011   right elbow, Dr. Percell Miller        Home Medications    Prior to Admission medications   Medication Sig Start Date End Date Taking? Authorizing Provider  acetaminophen (TYLENOL) 500 MG tablet Take 500 mg by mouth every 6 (six) hours as needed.    [provider]  amoxicillin-clavulanate (AUGMENTIN) 875-125 MG tablet Take 1 tablet by mouth 2 (two) times daily. 01/28/18   [name], DO  aspirin EC 81 MG EC tablet Take 81 mg by mouth daily.      [provider]  benzonatate (TESSALON) 100 MG capsule Take 1 capsule (100 mg total) by mouth 3 (three) times daily as needed. 11/19/17   [name], DO  chlorhexidine (PERIDEX) 0.12 % solution Use as directed 15 mLs in the mouth or throat 2 (two) times daily. "Swish and spit." 04/05/17   Kandra Nicolas, MD  Cyanocobalamin (VITAMIN B 12 PO) Take 500 mg by mouth daily.    [provider]  cyclobenzaprine (FLEXERIL) 10 MG tablet Take 1 tablet (10 mg total) by mouth 2 (two) times daily as needed. 04/29/18   Noe Gens, PA-C  fexofenadine (ALLEGRA) 180 MG tablet Take 1 tablet (180 mg total) by mouth daily. 02/20/17 03/12/17  [name], DO  fluticasone (FLONASE) 50 MCG/ACT nasal spray Place 2 sprays into both nostrils daily. 01/27/18   Saguier, Percell Miller, PA-C  HYDROcodone-homatropine (HYCODAN) 5-1.5 MG/5ML syrup Take 5 mLs by mouth every 8 (eight) hours as needed for cough. 11/19/17   [name], DO  hydrocortisone (ANUSOL-HC) 2.5 % rectal cream Place 1 application rectally at bedtime as needed for hemorrhoids or itching. 11/06/16   Irene Shipper, MD  levothyroxine (SYNTHROID, LEVOTHROID) 100 MCG tablet TAKE 1 TABLET BY MOUTH DAILY 03/30/18   [name], DO  lisinopril (PRINIVIL,ZESTRIL) 5 MG tablet TAKE 1 TABLET BY MOUTH DAILY 04/06/18   [name], DO  loratadine (CLARITIN) 10 MG tablet Take 10 mg by mouth daily.      [provider]  Melatonin 5 MG CAPS Take 2 capsules by mouth  daily.    [provider]  meloxicam (MOBIC) 15 MG tablet Take 1 tablet (15 mg total) by mouth daily. 02/05/18   Hyatt, Max T, DPM  methylPREDNISolone (MEDROL DOSEPAK) 4 MG TBPK tablet 6 day dose pack - take as directed 02/05/18   Hyatt, Max T, DPM  naproxen (EC NAPROSYN) 500 MG EC tablet Take 1 tablet (500 mg total) by mouth 2 (two) times daily with a meal. 03/24/17   Wendling, Crosby Oyster, DO  omeprazole (PRILOSEC) 40 MG capsule TAKE 1 CAPSULE (40 MG TOTAL) BY MOUTH DAILY. 03/02/18   [name], DO  pravastatin (PRAVACHOL) 40 MG tablet TAKE 1/2 TABLET (20 MG TOTAL) BY MOUTH DAILY. 12/08/17   [name], DO  ranitidine (ZANTAC) 150 MG tablet Take 1 tablet (150 mg total) by mouth 2 (two) times daily. 02/20/17 02/27/17  [name], DO  sodium bicarbonate 650 MG tablet Take 1 tablet (650 mg total) by mouth daily. 11/25/16   [name], DO  traMADol (ULTRAM) 50 MG tablet Take 1 tablet (  50 mg total) by mouth every 6 (six) hours as needed. 04/29/18   Noe Gens, PA-C    Family History Family History  Problem Relation Age of Onset  . Heart disease Father   . Breast cancer Mother   . Cancer Brother   . Alcohol abuse Other   . Arthritis Other   . Cancer Other        Breast, Prostate  . Coronary artery disease Other   . Irritable bowel syndrome Other   . Cystic fibrosis Other   . Colon cancer Neg Hx     Social History Social History   Tobacco Use  . Smoking status: Never Smoker  . Smokeless tobacco: Never Used  Substance Use Topics  . Alcohol use: No  . Drug use: No     Allergies   Oxycodone and Simvastatin   Review of Systems Review of Systems  Genitourinary: Negative for flank pain, frequency and hematuria.  Musculoskeletal: Positive for back pain and myalgias. Negative for neck pain and neck stiffness.  Skin: Negative for rash.     Physical Exam Triage Vital Signs ED Triage Vitals [04/29/18 1048]  Enc Vitals Group      BP 114/73     Pulse Rate 60     Resp      Temp 98.1 F (36.7 C)     Temp Source Oral     SpO2 96 %     Weight 193 lb (87.5 kg)     Height 5\' 8"  (1.727 m)     Head Circumference      Peak Flow      Pain Score 7     Pain Loc      Pain Edu?      Excl. in [place]?    No data found.  Updated Vital Signs BP 114/73 (BP Location: Right Arm)   Pulse 60   Temp 98.1 F (36.7 C) (Oral)   Ht 5\' 8"  (1.727 m)   Wt 193 lb (87.5 kg)   SpO2 96%   BMI 29.35 kg/m       Physical Exam  Constitutional: He is oriented to person, place, and time. He appears well-developed and well-nourished. No distress.  HENT:  Head: Normocephalic and atraumatic.  Eyes: EOM are normal.  Neck: Normal range of motion.  Cardiovascular: Normal rate.  Pulmonary/Chest: Effort normal.  Musculoskeletal: Normal range of motion. He exhibits tenderness. He exhibits no edema.  No midline spinal tenderness. Tenderness to Left lumbar muscles. Negative straight leg raise. Full ROM upper and lower extremities bilaterally with 5/5 strength.  Neurological: He is alert and oriented to person, place, and time.  Skin: Skin is warm and dry. He is not diaphoretic.  Psychiatric: He has a normal mood and affect. His behavior is normal.  Nursing note and vitals reviewed.    UC Treatments / Results  Labs (all labs ordered are listed, but only abnormal results are displayed) Labs Reviewed - No data to display  EKG None  Radiology No results found.  Procedures Procedures (including critical care time)  Medications Ordered in UC Medications - No data to display  Initial Impression / Assessment and Plan / UC Course  I have reviewed the triage vital signs and the nursing notes.  Pertinent labs & imaging results that were available during my care of the patient were reviewed by me and considered in my medical decision making (see chart for details).     Hx and exam c/w muscular pain.  Will treat conservatively.  F/u  with PCP as needed.  Final Clinical Impressions(s) / UC Diagnoses   Final diagnoses:  Acute left-sided low back pain without sciatica     Discharge Instructions      Tramadol is strong pain medication. While taking, do not drink alcohol, drive, or perform any other activities that requires focus while taking these medications.   Flexeril (cyclobenzaprine) is a muscle relaxer and may cause drowsiness. Do not drink alcohol, drive, or operate heavy machinery while taking.     ED Prescriptions    Medication Sig Dispense Auth. Provider   cyclobenzaprine (FLEXERIL) 10 MG tablet Take 1 tablet (10 mg total) by mouth 2 (two) times daily as needed. 20 tablet Gerarda Fraction, Laiyla Slagel O, PA-C   traMADol (ULTRAM) 50 MG tablet Take 1 tablet (50 mg total) by mouth every 6 (six) hours as needed. 15 tablet Noe Gens, PA-C     Controlled Substance Prescriptions [place] Controlled Substance Registry consulted? Yes, I have consulted the [place] Controlled Substances Registry for this patient, and feel the risk/benefit ratio today is favorable for proceeding with this prescription for a controlled substance.   Noe Gens, PA-C 04/29/18 1210

## 2018-04-29 NOTE — ED Triage Notes (Signed)
Tuesday morning woke up around 4:30 am with a back spasm.  Happened again at work that day.  Has tried [employer]are heat, TENs unit, back aid, muscle relaxer, and Aleve.  Is still very painful.

## 2018-04-29 NOTE — Discharge Instructions (Signed)
°  Tramadol is strong pain medication. While taking, do not drink alcohol, drive, or perform any other activities that requires focus while taking these medications.   Flexeril (cyclobenzaprine) is a muscle relaxer and may cause drowsiness. Do not drink alcohol, drive, or operate heavy machinery while taking.

## 2018-05-01 ENCOUNTER — Telehealth: Payer: Self-pay

## 2018-05-01 NOTE — Telephone Encounter (Signed)
Left message on VM to call if any questions or concerns.

## 2018-05-04 MED FILL — OMEPRAZOLE 40 MG CPDR: 40 | 30 days supply | Qty: 30 | Fill #2

## 2018-05-07 ENCOUNTER — Encounter: Payer: Self-pay | Admitting: Family Medicine

## 2018-05-07 ENCOUNTER — Ambulatory Visit: Payer: 59 | Admitting: Family Medicine

## 2018-05-07 VITALS — BP 108/82 | HR 65 | Temp 97.8°F | Ht 68.0 in | Wt 197.2 lb

## 2018-05-07 DIAGNOSIS — M545 Low back pain, unspecified: Secondary | ICD-10-CM

## 2018-05-07 NOTE — Progress Notes (Signed)
Pre visit review using our clinic review tool, if applicable. No additional management support is needed unless otherwise documented below in the visit note. 

## 2018-05-07 NOTE — Progress Notes (Signed)
Musculoskeletal Exam  Patient: Zachary [name] DOB: 08-30-1951  DOS: 05/07/2018  SUBJECTIVE:  Chief Complaint:   Chief Complaint  Patient presents with  . Back Pain    Zachary [name] is a 67 y.o.  male for evaluation and treatment of his back pain.   Onset:  9 days ago. Bent over to turn off alarm and had a spasm in back.  Location: lower Character:  aching and spasming  Progression of issue:  is a little improved Associated symptoms: none Denies bowel/bladder incontinence or weakness Treatment: to date has been heat, tramadol, msc relaxant, cold packs, .   Neurovascular symptoms: no  ROS: Musculoskeletal/Extremities: +back pain Neurologic: no numbness, tingling no weakness   Past Medical History:  Diagnosis Date  . Arthritis   . Articular cartilage disease    left shoulder  . Cancer ([facility]) 2015   positive prostate cancer bx  . Diverticulitis   . Dyslipidemia (high LDL; low HDL) 08/15/2016  . GERD (gastroesophageal reflux disease)   . Hearing problem    hearing deficit  . Hemorrhoids   . Hypertension   . Hypothyroidism   . Sleep apnea    uses a cpap  . Wears glasses   . Wears hearing aid    both ears    Objective:  VITAL SIGNS: BP 108/82 (BP Location: Left Arm, Patient Position: Sitting, Cuff Size: Normal)   Pulse 65   Temp 97.8 F (36.6 C) (Oral)   Ht 5\' 8"  (1.727 m)   Wt 197 lb 4 oz (89.5 kg)   SpO2 98%   BMI 29.99 kg/m  Constitutional: Well formed, well developed. No acute distress. HENT: Normocephalic, atraumatic.  Thorax & Lungs:  No accessory muscle use Extremities: No clubbing. No cyanosis. No edema.  Skin: Warm. Dry. No erythema. No rash.  Musculoskeletal: low back.   Tenderness to palpation: yes over L lumbar parasp msc Deformity: no Ecchymosis: no Straight leg test: negative for Poor hamstring flexibility b/l. Neurologic: Normal sensory function. No focal deficits noted. DTR's equal and symmetry in LE's. No clonus. Psychiatric:  Normal mood. Age appropriate judgment and insight. Alert & oriented x 3.    Assessment:  Acute left-sided low back pain without sciatica  Plan: Cont msc relaxant.  Stretches/exercises, heat, ice, Tylenol, NSAIDs. He really needs to stretch. Suggested the list we gave in addition to yoga. Can find videos on YouTube.  F/u prn. The patient voiced understanding and agreement to the plan.   [place], DO 05/07/18  9:38 AM

## 2018-05-07 NOTE — Patient Instructions (Signed)
Stretching will do you wonders.  Consider yoga- you can check this out on YouTube.  Let us know if you need anything.

## 2018-05-27 ENCOUNTER — Encounter: Payer: Self-pay | Admitting: Family Medicine

## 2018-05-27 ENCOUNTER — Ambulatory Visit (INDEPENDENT_AMBULATORY_CARE_PROVIDER_SITE_OTHER): Payer: 59 | Admitting: Family Medicine

## 2018-05-27 VITALS — BP 120/82 | HR 80 | Temp 98.2°F | Ht 68.0 in | Wt 197.2 lb

## 2018-05-27 DIAGNOSIS — R7303 Prediabetes: Secondary | ICD-10-CM | POA: Insufficient documentation

## 2018-05-27 DIAGNOSIS — E039 Hypothyroidism, unspecified: Secondary | ICD-10-CM | POA: Diagnosis not present

## 2018-05-27 DIAGNOSIS — Z0000 Encounter for general adult medical examination without abnormal findings: Secondary | ICD-10-CM

## 2018-05-27 NOTE — Patient Instructions (Addendum)
Call your pharmacy to see about the availability of the new shingles vaccine (Shingrix).  Try to stretch every now and then.  We will be in touch regarding the results of your labs.    Let us know if you need anything.

## 2018-05-27 NOTE — Progress Notes (Addendum)
Chief Complaint  Patient presents with  . Annual Exam    Well Male Zachary [name] is here for a complete physical.   His last physical was >1 year ago.  Current diet: in general, an "OK" diet.  Current exercise: walking Weight trend: stable Seat belt? Yes.    Health maintenance Shingrix- No Colonoscopy- Yes Tetanus- Yes HIV- Yes Hep C- Yes  Past Medical History:  Diagnosis Date  . Arthritis   . Articular cartilage disease    left shoulder  . Cancer ([place]) 2015   positive prostate cancer bx  . Diverticulitis   . Dyslipidemia (high LDL; low HDL) 08/15/2016  . GERD (gastroesophageal reflux disease)   . Hearing problem    hearing deficit  . Hemorrhoids   . Hypertension   . Hypothyroidism   . Sleep apnea    uses a cpap  . Wears glasses   . Wears hearing aid    both ears      Past Surgical History:  Procedure Laterality Date  . BACK SURGERY  1978   herniated disksurgery  . CARDIAC CATHETERIZATION  08/01/2010  . COLONOSCOPY    . EXAM UNDER ANESTHESIA WITH MANIPULATION OF SHOULDER Right 09/15/2014   Procedure: RIGHT SHOULDER MANIPULATION UNDER ANESTHESIA;  Surgeon: Ninetta Lights, MD;  Location: [facility];  Service: Orthopedics;  Laterality: Right;  . HEMORRHOID SURGERY  08/2006  . SHOULDER ARTHROSCOPY WITH DISTAL CLAVICLE RESECTION Left 07/07/2015   Procedure: SHOULDER ARTHROSCOPY WITH DISTAL CLAVICLE RESECTION;  Surgeon: Kathryne Hitch, MD;  Location: [facility];  Service: Orthopedics;  Laterality: Left;  . SHOULDER ARTHROSCOPY WITH ROTATOR CUFF REPAIR Left 12/07/2015   Procedure: LEFT SHOULDER ARTHROSCOPY DEBRIDEMENT, WITH ROTATOR CUFF REPAIR;  Surgeon: Ninetta Lights, MD;  Location: [place] [facility];  Service: Orthopedics;  Laterality: Left;  . SHOULDER ARTHROSCOPY WITH ROTATOR CUFF REPAIR AND SUBACROMIAL DECOMPRESSION Left 07/07/2015   Procedure: LEFT SHOULDER SCOPE DEBRIDEMENT, SUBACROMIAL DECOMPRESSION, DISTAL  CLAVICULECTOMY, ROTATOR CUFF REPAIR  ;  Surgeon: Kathryne Hitch, MD;  Location: [facility];  Service: Orthopedics;  Laterality: Left;  ANESTHESIA: GENERAL, PRE/POST OP SCALENE  . SHOULDER ARTHROSCOPY WITH SUBACROMIAL DECOMPRESSION, ROTATOR CUFF REPAIR AND BICEP TENDON REPAIR Right 03/10/2014   Procedure: RIGHT SHOULDER ARTHROSCOPY WITH SUBACROMIAL DECOMPRESSION, PARTIAL ACROMIOPLASTY WITH CORACOAROMIAL LABRUM DEBRIDEMENT RELEASE DISTAL CLAVICULECTOMY,  ROTATOR CUFF REPAIR AND EXTENSIVE DEBRIDEMENT;  Surgeon: Ninetta Lights, MD;  Location: [facility];  Service: Orthopedics;  Laterality: Right;  . TENDON REPAIR  June 06, 2011   right elbow, Dr. Percell Miller    Medications  Current Outpatient Medications on File Prior to Visit  Medication Sig Dispense Refill  . aspirin EC 81 MG EC tablet Take 81 mg by mouth daily.      . Cyanocobalamin (VITAMIN B 12 PO) Take 500 mg by mouth daily.    . cyclobenzaprine (FLEXERIL) 10 MG tablet Take 1 tablet (10 mg total) by mouth 2 (two) times daily as needed. 20 tablet 0  . hydrocortisone (ANUSOL-HC) 2.5 % rectal cream Place 1 application rectally at bedtime as needed for hemorrhoids or itching. 30 g 6  . levothyroxine (SYNTHROID, LEVOTHROID) 100 MCG tablet TAKE 1 TABLET BY MOUTH DAILY 90 tablet 1  . lisinopril (PRINIVIL,ZESTRIL) 5 MG tablet TAKE 1 TABLET BY MOUTH DAILY 90 tablet 1  . loratadine (CLARITIN) 10 MG tablet Take 10 mg by mouth daily.      . Melatonin 5 MG CAPS Take 2 capsules by mouth  daily.    . naproxen (EC NAPROSYN) 500 MG EC tablet Take 1 tablet (500 mg total) by mouth 2 (two) times daily with a meal. 30 tablet 0  . omeprazole (PRILOSEC) 40 MG capsule TAKE 1 CAPSULE (40 MG TOTAL) BY MOUTH DAILY. 30 capsule 5  . pravastatin (PRAVACHOL) 40 MG tablet TAKE 1/2 TABLET (20 MG TOTAL) BY MOUTH DAILY. 45 tablet 2  . traMADol (ULTRAM) 50 MG tablet Take 1 tablet (50 mg total) by mouth every 6 (six) hours as needed. 15 tablet 0  .  fexofenadine (ALLEGRA) 180 MG tablet Take 1 tablet (180 mg total) by mouth daily. 20 tablet 0    Allergies Allergies  Allergen Reactions  . Oxycodone Itching  . Simvastatin Other (See Comments)    REACTION: muscle pain    Family History Family History  Problem Relation Age of Onset  . Heart disease Father   . Breast cancer Mother   . Cancer Brother   . Alcohol abuse Other   . Arthritis Other   . Cancer Other        Breast, Prostate  . Coronary artery disease Other   . Irritable bowel syndrome Other   . Cystic fibrosis Other   . Colon cancer Neg Hx     Review of Systems: Constitutional:  no fevers Eye:  no recent significant change in vision Ear/Nose/Mouth/Throat:  Ears:  no hearing loss Nose/Mouth/Throat:  no complaints of nasal congestion, no sore throat Cardiovascular:  no chest pain, no palpitations Respiratory:  no cough and no shortness of breath Gastrointestinal:  no abdominal pain, no change in bowel habits GU:  Male: negative for dysuria, frequency, and incontinence and negative for prostate symptoms Musculoskeletal/Extremities:  no pain, redness, or swelling of the joints Integumentary (Skin/Breast):  no abnormal skin lesions reported Neurologic:  no headaches Endocrine: No unexpected weight changes Hematologic/Lymphatic:  no abnormal bleeding  Exam BP 120/82 (BP Location: Left Arm, Patient Position: Sitting, Cuff Size: Large)   Pulse 80   Temp 98.2 F (36.8 C) (Oral)   Ht 5\' 8"  (1.727 m)   Wt 197 lb 4 oz (89.5 kg)   SpO2 98%   BMI 29.99 kg/m  General:  well developed, well nourished, in no apparent distress Skin:  no significant moles, warts, or growths Head:  no masses, lesions, or tenderness Eyes:  pupils equal and round, sclera anicteric without injection Ears:  canals without lesions, TMs shiny without retraction, no obvious effusion, no erythema Nose:  nares patent, septum midline, mucosa normal Throat/Pharynx:  lips and gingiva without lesion;  tongue and uvula midline; non-inflamed pharynx; no exudates or postnasal drainage Neck: neck supple without adenopathy, thyromegaly, or masses Lungs:  clear to auscultation, breath sounds equal bilaterally, no respiratory distress Cardio:  regular rate and rhythm, no LE edema, no bruits Abdomen:  abdomen soft, nontender; bowel sounds normal; no masses or organomegaly Genital (male): Uncircumcised penis, no lesions or discharge; testes present bilaterally without masses or tenderness Rectal: Deferred Musculoskeletal:  symmetrical muscle groups noted without atrophy or deformity Extremities:  no clubbing, cyanosis, or edema, no deformities, no skin discoloration Neuro:  gait normal; deep tendon reflexes normal and symmetric Psych: well oriented with normal range of affect and appropriate judgment/insight  Assessment and Plan  Well adult exam - Plan: Lipid panel, Comprehensive metabolic panel, CBC  Hypothyroidism, unspecified type - Plan: TSH  Prediabetes - Plan: Hemoglobin A1c   Well 66 y.o. male. Counseled on diet and exercise. Pt has cancer team for  his prostate cancer, will hold off on PSA screening. Immunizations, labs, and further orders as above. Follow up in 6 mo for med check, asap for fasting labs. The patient voiced understanding and agreement to the plan.  [place], DO 05/27/18 1:52 PM

## 2018-05-27 NOTE — Progress Notes (Signed)
Pre visit review using our clinic review tool, if applicable. No additional management support is needed unless otherwise documented below in the visit note. 

## 2018-05-28 ENCOUNTER — Other Ambulatory Visit (INDEPENDENT_AMBULATORY_CARE_PROVIDER_SITE_OTHER): Payer: 59

## 2018-05-28 DIAGNOSIS — Z0000 Encounter for general adult medical examination without abnormal findings: Secondary | ICD-10-CM | POA: Diagnosis not present

## 2018-05-28 DIAGNOSIS — E039 Hypothyroidism, unspecified: Secondary | ICD-10-CM

## 2018-05-28 DIAGNOSIS — R7303 Prediabetes: Secondary | ICD-10-CM | POA: Diagnosis not present

## 2018-05-28 LAB — COMPREHENSIVE METABOLIC PANEL
ALT: 27 U/L (ref 0–53)
AST: 21 U/L (ref 0–37)
Albumin: 4.4 g/dL (ref 3.5–5.2)
Alkaline Phosphatase: 63 U/L (ref 39–117)
BUN: 18 mg/dL (ref 6–23)
CALCIUM: 9.9 mg/dL (ref 8.4–10.5)
CHLORIDE: 102 meq/L (ref 96–112)
CO2: 28 meq/L (ref 19–32)
CREATININE: 0.95 mg/dL (ref 0.40–1.50)
GFR: 84.7 mL/min (ref >60.00)
GLUCOSE: 108 mg/dL — AB (ref 70–99)
Potassium: 4.2 mEq/L (ref 3.5–5.1)
Sodium: 139 mEq/L (ref 135–145)
Total Bilirubin: 0.5 mg/dL (ref 0.2–1.2)
Total Protein: 6.6 g/dL (ref 6.0–8.3)

## 2018-05-28 LAB — HEMOGLOBIN A1C: HEMOGLOBIN A1C: 5.9 % (ref 4.6–6.5)

## 2018-05-28 LAB — CBC
HCT: 44.3 % (ref 39.0–52.0)
Hemoglobin: 15.2 g/dL (ref 13.0–17.0)
MCHC: 34.4 g/dL (ref 30.0–36.0)
MCV: 91.9 fl (ref 78.0–100.0)
Platelets: 232 10*3/uL (ref 150.0–400.0)
RBC: 4.82 Mil/uL (ref 4.22–5.81)
RDW: 13.4 % (ref 11.5–15.5)
WBC: 5.9 10*3/uL (ref 4.0–10.5)

## 2018-05-28 LAB — TSH: TSH: 2.65 u[IU]/mL (ref 0.35–4.50)

## 2018-05-28 LAB — LIPID PANEL
CHOLESTEROL: 162 mg/dL (ref 0–200)
HDL: 45.2 mg/dL (ref >39.00)
LDL CALC: 101 mg/dL — AB (ref 0–99)
NonHDL: 116.87
TRIGLYCERIDES: 77 mg/dL (ref 0.0–149.0)
Total CHOL/HDL Ratio: 4
VLDL: 15.4 mg/dL (ref 0.0–40.0)

## 2018-06-08 MED FILL — PRAVASTATIN NA 40 MG TAB: 40 | 90 days supply | Qty: 45 | Fill #2

## 2018-06-08 MED FILL — OMEPRAZOLE 40 MG CPDR: 40 | 30 days supply | Qty: 30 | Fill #3

## 2018-06-24 DIAGNOSIS — C61 Malignant neoplasm of prostate: Secondary | ICD-10-CM | POA: Diagnosis not present

## 2018-07-02 DIAGNOSIS — C61 Malignant neoplasm of prostate: Secondary | ICD-10-CM | POA: Diagnosis not present

## 2018-07-06 MED FILL — LEVOTHYROXINE 100 MCG TAB: 100 | 90 days supply | Qty: 90 | Fill #1

## 2018-07-06 MED FILL — LISINOPRIL 5 MG TABLET: 5 | 90 days supply | Qty: 90 | Fill #1

## 2018-07-06 MED FILL — OMEPRAZOLE 40 MG CPDR: 40 | 30 days supply | Qty: 30 | Fill #4

## 2018-08-05 MED FILL — OMEPRAZOLE 40 MG CPDR: 40 | 30 days supply | Qty: 30 | Fill #5

## 2018-08-11 ENCOUNTER — Telehealth: Payer: Self-pay | Admitting: *Deleted

## 2018-08-11 ENCOUNTER — Encounter: Payer: Self-pay | Admitting: Podiatry

## 2018-08-11 ENCOUNTER — Ambulatory Visit (INDEPENDENT_AMBULATORY_CARE_PROVIDER_SITE_OTHER): Payer: 59 | Admitting: Podiatry

## 2018-08-11 DIAGNOSIS — T148XXA Other injury of unspecified body region, initial encounter: Secondary | ICD-10-CM

## 2018-08-11 DIAGNOSIS — M722 Plantar fascial fibromatosis: Secondary | ICD-10-CM | POA: Diagnosis not present

## 2018-08-11 NOTE — Telephone Encounter (Signed)
-----   Message from Rip Harbour, [facility] sent at 08/11/2018 10:01 AM EDT ----- Regarding: MRI MRI left ankle left - evaluate for plantar fascial tear left - surgical consideration

## 2018-08-11 NOTE — Telephone Encounter (Signed)
Orders to Gretta Arab, RN for pre-cert and faxed to [facility].

## 2018-08-11 NOTE — Progress Notes (Signed)
He presents today after having not seen him since February 2019.  He presents for follow-up of bilateral plantar fasciitis.  He states that the right heel is doing great the left foot is just absolutely killing me.  He states that he continues to wear the plantar fascial brace and a night splint.  Objective: Vital signs are stable he is alert and oriented x3 pulses are palpable.  No edema no erythema cellulitis drainage or odor he has severe pain on palpation medial calcaneal tubercle of the left heel.  No pain on medial and lateral compression of the calcaneus.  Assessment: Plantar fasciitis chronic in nature left resolved right.  Plan: Discussed etiology pathology conservative versus surgical therapies.  Offered him another injection today with Medrol Dosepak explaining to him that we need to stay on top of this and not wait several months between visits he states that he does not want another injection he does not want physical therapy he does not want orthotics at this point.  I recommend admitted to him that we consider an MRI of the left foot so that we may perform a surgical intervention note is an endoscopic plantar fasciotomy.  I expressed to him however that after the endoscopic plantar fasciotomy he would still have to have orthotics so I recommended that we have him scanned today for orthotics this was performed in the office today and I consented him for MRI once the MRI comes in we will then consider him for an endoscopic fasciotomy.

## 2018-08-17 ENCOUNTER — Ambulatory Visit (INDEPENDENT_AMBULATORY_CARE_PROVIDER_SITE_OTHER): Payer: 59

## 2018-08-17 DIAGNOSIS — X58XXXA Exposure to other specified factors, initial encounter: Secondary | ICD-10-CM | POA: Diagnosis not present

## 2018-08-17 DIAGNOSIS — S86312A Strain of muscle(s) and tendon(s) of peroneal muscle group at lower leg level, left leg, initial encounter: Secondary | ICD-10-CM | POA: Diagnosis not present

## 2018-08-17 DIAGNOSIS — M722 Plantar fascial fibromatosis: Secondary | ICD-10-CM

## 2018-08-17 DIAGNOSIS — S96812A Strain of other specified muscles and tendons at ankle and foot level, left foot, initial encounter: Secondary | ICD-10-CM | POA: Diagnosis not present

## 2018-08-17 DIAGNOSIS — M25561 Pain in right knee: Secondary | ICD-10-CM | POA: Diagnosis not present

## 2018-08-18 ENCOUNTER — Telehealth: Payer: Self-pay | Admitting: *Deleted

## 2018-08-18 NOTE — Telephone Encounter (Signed)
Left message informing pt of Dr. Stephenie Acres review of results and request to send copy of MRI disc to radiology specialist for more indepth information for treatment planning, and there would be a 2 week delay in the final results, we would contact him with instructions once received.

## 2018-08-18 NOTE — Telephone Encounter (Signed)
I spoke with pt and he states he doesn't understand why this has to be done, he is in a lot of pain and he feels it isn't necessary, that it should be simple. Pt asked why it would be 2 weeks. I told pt I always gave the outer perimeters, it could be 7 days, but I also had to order the MRI disc copy from [facility], and that had been done this morning. Pt asked if he had to pay for it. I informed pt Dr. Milinda Pointer took the expense. I told pt he could opt to make an appt to discuss treatment with the result that are currently available and pt states he will have to think about it.

## 2018-08-18 NOTE — Telephone Encounter (Signed)
The nurse left me a voicemail message and I have questions regarding that message. I can be reached at [phone]. Thanks.

## 2018-08-18 NOTE — Telephone Encounter (Signed)
I ordered pt's copy of the MRI disc from [facility] to be mailed to our office.

## 2018-08-18 NOTE — Telephone Encounter (Signed)
-----   Message from Garrel Ridgel, [place] sent at 08/18/2018  7:05 AM EDT ----- Send for over read and inform the patient of the delay please

## 2018-08-20 NOTE — Telephone Encounter (Signed)
Left message requesting status of pt's MRI disc copy from [facility] - Radiology.

## 2018-08-21 NOTE — Telephone Encounter (Signed)
[facility] skyped, pt told Destiny that he was not coming back to our office he has an appt with [facility]. I skyped to make sure pt took copy of the MRI disc and report.

## 2018-08-25 DIAGNOSIS — M25572 Pain in left ankle and joints of left foot: Secondary | ICD-10-CM | POA: Diagnosis not present

## 2018-08-25 DIAGNOSIS — Z885 Allergy status to narcotic agent status: Secondary | ICD-10-CM | POA: Diagnosis not present

## 2018-08-25 DIAGNOSIS — M722 Plantar fascial fibromatosis: Secondary | ICD-10-CM | POA: Insufficient documentation

## 2018-08-25 DIAGNOSIS — Z888 Allergy status to other drugs, medicaments and biological substances status: Secondary | ICD-10-CM | POA: Diagnosis not present

## 2018-09-01 ENCOUNTER — Encounter: Payer: Self-pay | Admitting: Physical Therapy

## 2018-09-01 ENCOUNTER — Ambulatory Visit: Payer: 59 | Admitting: Orthotics

## 2018-09-01 ENCOUNTER — Ambulatory Visit: Payer: 59 | Attending: Orthopedic Surgery | Admitting: Physical Therapy

## 2018-09-01 ENCOUNTER — Other Ambulatory Visit: Payer: Self-pay

## 2018-09-01 DIAGNOSIS — M25672 Stiffness of left ankle, not elsewhere classified: Secondary | ICD-10-CM

## 2018-09-01 DIAGNOSIS — M722 Plantar fascial fibromatosis: Secondary | ICD-10-CM

## 2018-09-01 DIAGNOSIS — M79672 Pain in left foot: Secondary | ICD-10-CM | POA: Diagnosis not present

## 2018-09-01 DIAGNOSIS — M25572 Pain in left ankle and joints of left foot: Secondary | ICD-10-CM | POA: Diagnosis not present

## 2018-09-01 NOTE — Therapy (Signed)
[facility] [address]  [place], [place], [address] Phone: [phone]   Fax:  [phone]  Physical Therapy Evaluation  Patient Details  Name: Zachary [name] MRN: [id number] Date of Birth: 1951/01/31 Referring Provider: Dr. Francee Gentile   Encounter Date: 09/01/2018  PT End of Session - 09/01/18 1256    Visit Number  1    Number of Visits  18    Date for PT Re-Evaluation  10/13/18    PT Start Time  1300    PT Stop Time  1348    PT Time Calculation (min)  48 min    Activity Tolerance  Patient tolerated treatment well;Patient limited by pain    Behavior During Therapy  [facility] for tasks assessed/performed       Past Medical History:  Diagnosis Date  . Arthritis   . Articular cartilage disease    left shoulder  . Cancer ([place]) 2015   positive prostate cancer bx  . Diverticulitis   . Dyslipidemia (high LDL; low HDL) 08/15/2016  . GERD (gastroesophageal reflux disease)   . Hearing problem    hearing deficit  . Hemorrhoids   . Hypertension   . Hypothyroidism   . Sleep apnea    uses a cpap  . Wears glasses   . Wears hearing aid    both ears    Past Surgical History:  Procedure Laterality Date  . BACK SURGERY  1978   herniated disksurgery  . CARDIAC CATHETERIZATION  08/01/2010  . COLONOSCOPY    . EXAM UNDER ANESTHESIA WITH MANIPULATION OF SHOULDER Right 09/15/2014   Procedure: RIGHT SHOULDER MANIPULATION UNDER ANESTHESIA;  Surgeon: Ninetta Lights, MD;  Location: [place];  Service: Orthopedics;  Laterality: Right;  . HEMORRHOID SURGERY  08/2006  . SHOULDER ARTHROSCOPY WITH DISTAL CLAVICLE RESECTION Left 07/07/2015   Procedure: SHOULDER ARTHROSCOPY WITH DISTAL CLAVICLE RESECTION;  Surgeon: Kathryne Hitch, MD;  Location: [place];  Service: Orthopedics;  Laterality: Left;  . SHOULDER ARTHROSCOPY WITH ROTATOR CUFF REPAIR Left 12/07/2015   Procedure: LEFT SHOULDER ARTHROSCOPY  DEBRIDEMENT, WITH ROTATOR CUFF REPAIR;  Surgeon: Ninetta Lights, MD;  Location: [place];  Service: Orthopedics;  Laterality: Left;  . SHOULDER ARTHROSCOPY WITH ROTATOR CUFF REPAIR AND SUBACROMIAL DECOMPRESSION Left 07/07/2015   Procedure: LEFT SHOULDER SCOPE DEBRIDEMENT, SUBACROMIAL DECOMPRESSION, DISTAL CLAVICULECTOMY, ROTATOR CUFF REPAIR  ;  Surgeon: Kathryne Hitch, MD;  Location: [place];  Service: Orthopedics;  Laterality: Left;  ANESTHESIA: GENERAL, PRE/POST OP SCALENE  . SHOULDER ARTHROSCOPY WITH SUBACROMIAL DECOMPRESSION, ROTATOR CUFF REPAIR AND BICEP TENDON REPAIR Right 03/10/2014   Procedure: RIGHT SHOULDER ARTHROSCOPY WITH SUBACROMIAL DECOMPRESSION, PARTIAL ACROMIOPLASTY WITH CORACOAROMIAL LABRUM DEBRIDEMENT RELEASE DISTAL CLAVICULECTOMY,  ROTATOR CUFF REPAIR AND EXTENSIVE DEBRIDEMENT;  Surgeon: Ninetta Lights, MD;  Location: [place];  Service: Orthopedics;  Laterality: Right;  . TENDON REPAIR  June 06, 2011   right elbow, Dr. Percell Miller    There were no vitals filed for this visit.   Subjective Assessment - 09/01/18 1301    Subjective  Has been hurting since February in left foot; Just got orthotics this morning; He's had injections which didn't help. MD also recommended insole from Dr. Zoe Lan.    Pertinent History  Bil shoulder surgeries, prostate cA    Diagnostic tests  mri    Patient Stated Goals  to get rid of pain    Currently in Pain?  Yes  Pain Score  9     Pain Location  Foot    Pain Orientation  Left   at base of heel   Pain Descriptors / Indicators  Sharp    Pain Type  Chronic pain    Pain Onset  More than a month ago    Aggravating Factors   walking    Pain Relieving Factors  nothing    Effect of Pain on Daily Activities  limited walking         [facility] PT Assessment - 09/01/18 0001      Assessment   Medical Diagnosis  L foot plantar faciitis    Referring Provider  Dr. Francee Gentile    Onset Date/Surgical Date   02/06/18      Balance Screen   Has the patient fallen in the past 6 months  No    Has the patient had a decrease in activity level because of a fear of falling?   No    Is the patient reluctant to leave their home because of a fear of falling?   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      ROM / Strength   AROM / PROM / Strength  AROM;PROM;Strength      AROM   AROM Assessment Site  Ankle    Right/Left Ankle  Left    Left Ankle Dorsiflexion  -14    Left Ankle Plantar Flexion  50    Left Ankle Inversion  16    Left Ankle Eversion  7      PROM   PROM Assessment Site  Ankle    Right/Left Ankle  Left    Left Ankle Dorsiflexion  -2    Left Ankle Plantar Flexion  55    Left Ankle Inversion  32    Left Ankle Eversion  40      Strength   Strength Assessment Site  Ankle    Right/Left Ankle  Left    Left Ankle Dorsiflexion  5/5    Left Ankle Plantar Flexion  3-/5    Left Ankle Inversion  4-/5   due to pain   Left Ankle Eversion  4+/5      Flexibility   Soft Tissue Assessment /Muscle Length  yes    Hamstrings  mod L    Piriformis  mod L      Palpation   Palpation comment  marked tenderness in L soleus and gastorc, around plantar surface of heel; increased tone in gastroc/soleus and peronneals                Objective measurements completed on examination: See above findings.                PT Short Term Goals - 09/01/18 1508      PT SHORT TERM GOAL #1   Title  I with initial HEP     Time  3    Period  Weeks    Status  New    Target Date  09/22/18  PT SHORT TERM GOAL #2   Title  decreased pain in L heel/foot by 30% or  more to ease ADLS    Time  3    Period  Weeks    Status  New        PT Long Term Goals - 09/01/18 1509      PT LONG TERM GOAL #1   Title  Improved L ankle DF to 5 degress or better to normalize gait    Time  6    Period  Weeks    Status  New    Target Date  10/13/18      PT LONG TERM GOAL #2   Title  patient able to ambulate with a normal gait pattern and pain 2/10 or less in the L foot/heel.    Time  6    Period  Weeks    Status  New    Target Date  10/13/18      PT LONG TERM GOAL #3   Title  patient to demo 4+/5 strength in the L ankle.    Time  6    Period  Weeks    Status  New      PT LONG TERM GOAL #4   Title  patient to demo active L ankle ROM to [facility] to normalize ADLS    Time  6    Period  Weeks    Status  New             Plan - 09/01/18 1500    Clinical Impression Statement  Patient presents for low complexity evaluation for Lt foot plantar faciitis. He has constant pain which increases with ambulation and affects his gait pattern. He has marked gastroc/soleus and peroneal tightness with active TPs. He has marked flexibility deficits in his ankle and hips. Patient received orthotics today bil.    History and Personal Factors relevant to plan of care:  Bil shoulder surgeries, prostate CA    Clinical Presentation  Evolving    Clinical Presentation due to:  worsening sx    Clinical Decision Making  Low    Rehab Potential  Good    PT Frequency  3x / week    PT Duration  6 weeks    PT Treatment/Interventions  ADLs/Self Care Home Management;Cryotherapy;Electrical Stimulation;Iontophoresis 4mg /ml Dexamethasone;Ultrasound;Therapeutic exercise;Patient/family education;Manual techniques;Passive range of motion;Dry needling;Taping    PT Next Visit Plan  assess [place], STW to gastroc/soleus/peroneals/plantar fascia with DN prn; review HEP    PT Home Exercise Plan  seated gastroc and soleus stretches with towel; seated toe stretch; ice bottle for MFR    Consulted and  Agree with Plan of Care  Patient       Patient will benefit from skilled therapeutic intervention in order to improve the following deficits and impairments:  Abnormal gait, Pain, Decreased range of motion, Decreased strength, Impaired flexibility  Visit Diagnosis: Pain in left foot - Plan: PT plan of care cert/re-cert  Stiffness of left ankle, not elsewhere classified - Plan: PT plan of care cert/re-cert     Problem List Patient Active Problem List   Diagnosis Date Noted  . Prediabetes 05/27/2018  . Stomatitis 04/17/2017  . Dyslipidemia (high LDL; low HDL) 08/15/2016  . S/P arthroscopy of shoulder 05/31/2016  . History of prostate cancer 05/09/2016  . Complete rotator cuff tear of left shoulder 07/05/2015  . Tennis elbow 05/30/2014  . Rotator cuff (capsule) sprain 03/10/2014  . Right shoulder pain 02/11/2014  . Memory difficulty  12/18/2012  . Gynecomastia, male 07/30/2012  . OSA (obstructive sleep apnea) 04/23/2011  . EXTERNAL HEMORRHOIDS 02/12/2011  . Prostate cancer ([place]) 11/12/2010  . PLANTAR FASCIITIS 04/09/2010  . Hypothyroidism 12/08/2009  . NEOPLASM OF UNCERTAIN BEHAVIOR OF SKIN 03/10/2009  . OSTEOARTHRITIS 02/14/2009  . HEARING DEFICIT 02/08/2009  . Essential hypertension 02/08/2009  . Allergic rhinitis 02/08/2009  . GERD 02/08/2009  . PROTEINURIA 02/08/2009  . DIVERTICULITIS, HX OF 02/08/2009    Charleston Hankin PT 09/01/2018, 3:18 PM  [facility] [address]  [place], [place], [address] Phone: [phone]   Fax:  [phone]  Name: Zachary [name] MRN: [id number] Date of Birth: 12-16-1951

## 2018-09-01 NOTE — Patient Instructions (Signed)
Gastroc / Heel Cord Stretch - Seated With Towel    Stretch 1: Sit on floor, towel around ball of foot. Gently pull foot in toward body, stretching heel cord and calf. Hold for _30-60__ seconds. Repeat on involved leg.  Stretch 2: Do the same as above only bend your knee slightly.  Repeat _3__ times. Do _2 __ times per day.   Copyright  VHI. All rights reserved.     ROLL ICE BOTTLE ALONG ARCH   Madelyn Flavors, PT 09/01/18 1:48 PM;

## 2018-09-01 NOTE — Progress Notes (Signed)
Patient came in today to pick up custom made foot orthotics.  The goals were accomplished and the patient reported no dissatisfaction with said orthotics.  Patient was advised of breakin period and how to report any issues. 

## 2018-09-02 ENCOUNTER — Encounter: Payer: Self-pay | Admitting: Physical Therapy

## 2018-09-02 ENCOUNTER — Ambulatory Visit: Payer: 59 | Admitting: Physical Therapy

## 2018-09-02 DIAGNOSIS — M79672 Pain in left foot: Secondary | ICD-10-CM

## 2018-09-02 DIAGNOSIS — M25572 Pain in left ankle and joints of left foot: Secondary | ICD-10-CM | POA: Diagnosis not present

## 2018-09-02 DIAGNOSIS — M25672 Stiffness of left ankle, not elsewhere classified: Secondary | ICD-10-CM

## 2018-09-02 NOTE — Therapy (Signed)
[facility] [address]  [place], [place], [address] Phone: [phone]   Fax:  [phone]  Physical Therapy Treatment  Patient Details  Name: Zachary [name] MRN: [id number] Date of Birth: 05-Mar-1951 Referring Provider: Dr. Francee Gentile   Encounter Date: 09/02/2018  PT End of Session - 09/02/18 1037    Visit Number  2    Number of Visits  18    Date for PT Re-Evaluation  10/13/18    Authorization Type  [facility]    PT Start Time  0845    PT Stop Time  0923    PT Time Calculation (min)  38 min    Activity Tolerance  Patient tolerated treatment well;Patient limited by pain    Behavior During Therapy  [facility] for tasks assessed/performed       Past Medical History:  Diagnosis Date  . Arthritis   . Articular cartilage disease    left shoulder  . Cancer ([place]) 2015   positive prostate cancer bx  . Diverticulitis   . Dyslipidemia (high LDL; low HDL) 08/15/2016  . GERD (gastroesophageal reflux disease)   . Hearing problem    hearing deficit  . Hemorrhoids   . Hypertension   . Hypothyroidism   . Sleep apnea    uses a cpap  . Wears glasses   . Wears hearing aid    both ears    Past Surgical History:  Procedure Laterality Date  . BACK SURGERY  1978   herniated disksurgery  . CARDIAC CATHETERIZATION  08/01/2010  . COLONOSCOPY    . EXAM UNDER ANESTHESIA WITH MANIPULATION OF SHOULDER Right 09/15/2014   Procedure: RIGHT SHOULDER MANIPULATION UNDER ANESTHESIA;  Surgeon: Ninetta Lights, MD;  Location: [place];  Service: Orthopedics;  Laterality: Right;  . HEMORRHOID SURGERY  08/2006  . SHOULDER ARTHROSCOPY WITH DISTAL CLAVICLE RESECTION Left 07/07/2015   Procedure: SHOULDER ARTHROSCOPY WITH DISTAL CLAVICLE RESECTION;  Surgeon: Kathryne Hitch, MD;  Location: [place];  Service: Orthopedics;  Laterality: Left;  . SHOULDER ARTHROSCOPY WITH ROTATOR CUFF REPAIR Left 12/07/2015    Procedure: LEFT SHOULDER ARTHROSCOPY DEBRIDEMENT, WITH ROTATOR CUFF REPAIR;  Surgeon: Ninetta Lights, MD;  Location: [place];  Service: Orthopedics;  Laterality: Left;  . SHOULDER ARTHROSCOPY WITH ROTATOR CUFF REPAIR AND SUBACROMIAL DECOMPRESSION Left 07/07/2015   Procedure: LEFT SHOULDER SCOPE DEBRIDEMENT, SUBACROMIAL DECOMPRESSION, DISTAL CLAVICULECTOMY, ROTATOR CUFF REPAIR  ;  Surgeon: Kathryne Hitch, MD;  Location: [place];  Service: Orthopedics;  Laterality: Left;  ANESTHESIA: GENERAL, PRE/POST OP SCALENE  . SHOULDER ARTHROSCOPY WITH SUBACROMIAL DECOMPRESSION, ROTATOR CUFF REPAIR AND BICEP TENDON REPAIR Right 03/10/2014   Procedure: RIGHT SHOULDER ARTHROSCOPY WITH SUBACROMIAL DECOMPRESSION, PARTIAL ACROMIOPLASTY WITH CORACOAROMIAL LABRUM DEBRIDEMENT RELEASE DISTAL CLAVICULECTOMY,  ROTATOR CUFF REPAIR AND EXTENSIVE DEBRIDEMENT;  Surgeon: Ninetta Lights, MD;  Location: [place];  Service: Orthopedics;  Laterality: Right;  . TENDON REPAIR  June 06, 2011   right elbow, Dr. Percell Miller    There were no vitals filed for this visit.  Subjective Assessment - 09/02/18 0846    Subjective  Has been compliant with HEP. Was up all night with foot pain which happens to him sometimes. Reports prostate CA is in remission at this time.    Pertinent History  prostate CA 2015, HLD, GERD, hearing aids, HTN, hypothyroidism, cardiac cath,R shoulder RTC and biceps repair, R shoulder surgical manip, L shoulder RTC repair, R elbow tendon  repair     Diagnostic tests  mri    Patient Stated Goals  to get rid of pain    Currently in Pain?  Yes    Pain Score  9     Pain Location  Foot    Pain Orientation  Left    Pain Descriptors / Indicators  Sharp    Pain Type  Chronic pain                       [facility] Adult PT Treatment/Exercise - 09/02/18 0001      Exercises   Exercises  Ankle      Manual Therapy   Manual Therapy  Soft tissue mobilization;Passive  ROM    Soft tissue mobilization  L plantar surface and gastroc- soft tissue restriction centrally and medially; self STM with ball to L plantar surface    Passive ROM  L DF + great toe extension stretch 2x30" to tolerance      Ankle Exercises: Stretches   Plantar Fascia Stretch  2 reps;30 seconds;Limitations    Plantar Fascia Stretch Limitations  L against wall    Gastroc Stretch  2 reps    Gastroc Stretch Limitations  L prostretch at counter top      Ankle Exercises: Aerobic   Nustep  L3 x 34min      Ankle Exercises: Standing   Heel Raises  Both;10 reps;Limitations    Heel Raises Limitations  2x10; slow eccentric lower; limited range to avoid pain      Ankle Exercises: Seated   Other Seated Ankle Exercises  L 4 way ankle x10 each; red TB DF/PF, yellow TB INV/EV   manual assistance to avoid hip motion            PT Education - 09/02/18 1037    Education Details  update to HEP; administered red and yellow TBs; edu on self-STM with tennis ball    Person(s) Educated  Patient    Methods  Explanation;Demonstration;Tactile cues;Verbal cues;Handout    Comprehension  Verbalized understanding;Returned demonstration       PT Short Term Goals - 09/02/18 1044      PT SHORT TERM GOAL #1   Title  I with initial HEP     Time  3    Period  Weeks    Status  On-going      PT SHORT TERM GOAL #2   Title  decreased pain in L heel/foot by 30% or more to ease ADLS    Time  3    Period  Weeks    Status  New        PT Long Term Goals - 09/02/18 1044      PT LONG TERM GOAL #1   Title  Improved L ankle DF to 5 degress or better to normalize gait    Time  6    Period  Weeks    Status  On-going      PT LONG TERM GOAL #2   Title  patient able to ambulate with a normal gait pattern and pain 2/10 or less in the L foot/heel.    Time  6    Period  Weeks    Status  On-going      PT LONG TERM GOAL #3   Title  patient to demo 4+/5 strength in the L ankle.    Time  6    Period  Weeks     Status  On-going  PT LONG TERM GOAL #4   Title  patient to demo active L ankle ROM to [facility] to normalize ADLS    Time  6    Period  Weeks    Status  On-going            Plan - 09/02/18 1038    Clinical Impression Statement  Patient reported compliance with previously administered HEP, however was having trouble sleeping from L foot pain last PM.  Tolerated STM to L plantar surface of foot and L gastroc with most tenderness in central plantar surface near heel and medial gastroc. Limited by pain. Educated patient on self-STM to plantar surface of foot with tennis ball and encouraged patient to perform standing stretches throughout day as patient on his feet all day. Also advised to wear orthotics an extra hour each day to avoid pain. Patient reported understanding. Limited tolerance for full-range heel raises, better tolerance with limited range. Updated HEP with 4 way ankle and TB resistance. Patient reported mildly improved pain at end of session.     PT Treatment/Interventions  ADLs/Self Care Home Management;Cryotherapy;Electrical Stimulation;Iontophoresis 4mg /ml Dexamethasone;Ultrasound;Therapeutic exercise;Patient/family education;Manual techniques;Passive range of motion;Dry needling;Taping    Consulted and Agree with Plan of Care  Patient       Patient will benefit from skilled therapeutic intervention in order to improve the following deficits and impairments:  Abnormal gait, Pain, Decreased range of motion, Decreased strength, Impaired flexibility  Visit Diagnosis: Pain in left foot  Stiffness of left ankle, not elsewhere classified     Problem List Patient Active Problem List   Diagnosis Date Noted  . Prediabetes 05/27/2018  . Stomatitis 04/17/2017  . Dyslipidemia (high LDL; low HDL) 08/15/2016  . S/P arthroscopy of shoulder 05/31/2016  . History of prostate cancer 05/09/2016  . Complete rotator cuff tear of left shoulder 07/05/2015  . Tennis elbow 05/30/2014   . Rotator cuff (capsule) sprain 03/10/2014  . Right shoulder pain 02/11/2014  . Memory difficulty 12/18/2012  . Gynecomastia, male 07/30/2012  . OSA (obstructive sleep apnea) 04/23/2011  . EXTERNAL HEMORRHOIDS 02/12/2011  . Prostate cancer ([place]) 11/12/2010  . PLANTAR FASCIITIS 04/09/2010  . Hypothyroidism 12/08/2009  . NEOPLASM OF UNCERTAIN BEHAVIOR OF SKIN 03/10/2009  . OSTEOARTHRITIS 02/14/2009  . HEARING DEFICIT 02/08/2009  . Essential hypertension 02/08/2009  . Allergic rhinitis 02/08/2009  . GERD 02/08/2009  . PROTEINURIA 02/08/2009  . DIVERTICULITIS, HX OF 02/08/2009    Janene Harvey, PT, DPT 09/02/18 10:47 AM    [facility] [address]  [place], [place], [address] Phone: [phone]   Fax:  [phone]  Name: ESTEVON FLUKE MRN: [id number] Date of Birth: 1951/10/17

## 2018-09-03 ENCOUNTER — Encounter: Payer: Self-pay | Admitting: Physical Therapy

## 2018-09-03 ENCOUNTER — Ambulatory Visit: Payer: 59 | Admitting: Physical Therapy

## 2018-09-03 ENCOUNTER — Other Ambulatory Visit: Payer: Self-pay | Admitting: Family Medicine

## 2018-09-03 DIAGNOSIS — M79672 Pain in left foot: Secondary | ICD-10-CM | POA: Diagnosis not present

## 2018-09-03 DIAGNOSIS — K219 Gastro-esophageal reflux disease without esophagitis: Secondary | ICD-10-CM

## 2018-09-03 DIAGNOSIS — M25572 Pain in left ankle and joints of left foot: Secondary | ICD-10-CM | POA: Diagnosis not present

## 2018-09-03 DIAGNOSIS — M25672 Stiffness of left ankle, not elsewhere classified: Secondary | ICD-10-CM

## 2018-09-03 MED FILL — OMEPRAZOLE 40 MG CPDR: 40 | 30 days supply | Qty: 30 | Fill #0

## 2018-09-03 NOTE — Therapy (Signed)
[facility] [address]  [place], [place], [address] Phone: [phone]   Fax:  [phone]  Physical Therapy Treatment  Patient Details  Name: Zachary [name] MRN: [id number] Date of Birth: 23-Jul-1951 Referring Provider: Dr. Francee Gentile   Encounter Date: 09/03/2018  PT End of Session - 09/03/18 1035    Visit Number  3    Number of Visits  18    Date for PT Re-Evaluation  10/13/18    Authorization Type  [facility]    PT Start Time  0931    PT Stop Time  1010    PT Time Calculation (min)  39 min    Activity Tolerance  Patient tolerated treatment well;Patient limited by pain    Behavior During Therapy  [facility] for tasks assessed/performed       Past Medical History:  Diagnosis Date  . Arthritis   . Articular cartilage disease    left shoulder  . Cancer ([place]) 2015   positive prostate cancer bx  . Diverticulitis   . Dyslipidemia (high LDL; low HDL) 08/15/2016  . GERD (gastroesophageal reflux disease)   . Hearing problem    hearing deficit  . Hemorrhoids   . Hypertension   . Hypothyroidism   . Sleep apnea    uses a cpap  . Wears glasses   . Wears hearing aid    both ears    Past Surgical History:  Procedure Laterality Date  . BACK SURGERY  1978   herniated disksurgery  . CARDIAC CATHETERIZATION  08/01/2010  . COLONOSCOPY    . EXAM UNDER ANESTHESIA WITH MANIPULATION OF SHOULDER Right 09/15/2014   Procedure: RIGHT SHOULDER MANIPULATION UNDER ANESTHESIA;  Surgeon: Ninetta Lights, MD;  Location: [facility];  Service: Orthopedics;  Laterality: Right;  . HEMORRHOID SURGERY  08/2006  . SHOULDER ARTHROSCOPY WITH DISTAL CLAVICLE RESECTION Left 07/07/2015   Procedure: SHOULDER ARTHROSCOPY WITH DISTAL CLAVICLE RESECTION;  Surgeon: Kathryne Hitch, MD;  Location: [facility];  Service: Orthopedics;  Laterality: Left;  . SHOULDER ARTHROSCOPY WITH ROTATOR CUFF REPAIR Left 12/07/2015    Procedure: LEFT SHOULDER ARTHROSCOPY DEBRIDEMENT, WITH ROTATOR CUFF REPAIR;  Surgeon: Ninetta Lights, MD;  Location: [facility];  Service: Orthopedics;  Laterality: Left;  . SHOULDER ARTHROSCOPY WITH ROTATOR CUFF REPAIR AND SUBACROMIAL DECOMPRESSION Left 07/07/2015   Procedure: LEFT SHOULDER SCOPE DEBRIDEMENT, SUBACROMIAL DECOMPRESSION, DISTAL CLAVICULECTOMY, ROTATOR CUFF REPAIR  ;  Surgeon: Kathryne Hitch, MD;  Location: [facility];  Service: Orthopedics;  Laterality: Left;  ANESTHESIA: GENERAL, PRE/POST OP SCALENE  . SHOULDER ARTHROSCOPY WITH SUBACROMIAL DECOMPRESSION, ROTATOR CUFF REPAIR AND BICEP TENDON REPAIR Right 03/10/2014   Procedure: RIGHT SHOULDER ARTHROSCOPY WITH SUBACROMIAL DECOMPRESSION, PARTIAL ACROMIOPLASTY WITH CORACOAROMIAL LABRUM DEBRIDEMENT RELEASE DISTAL CLAVICULECTOMY,  ROTATOR CUFF REPAIR AND EXTENSIVE DEBRIDEMENT;  Surgeon: Ninetta Lights, MD;  Location: [facility];  Service: Orthopedics;  Laterality: Right;  . TENDON REPAIR  June 06, 2011   right elbow, Dr. Percell Miller    There were no vitals filed for this visit.  Subjective Assessment - 09/03/18 0844    Subjective  Reports he felt sore after last session. Realized he was wearing the wrong size shoes yesterday. Has been compliant with HEP and good benefit from ice bottle massage. Reports previous xray showed heel spur in L foot.     Pertinent History  prostate CA 2015, HLD, GERD, hearing aids, HTN, hypothyroidism, cardiac cath,R shoulder RTC and biceps  repair, R shoulder surgical manip, L shoulder RTC repair, R elbow tendon repair     Diagnostic tests  mri    Patient Stated Goals  to get rid of pain    Currently in Pain?  Yes    Pain Score  9    8.5/10   Pain Location  Foot    Pain Orientation  Left    Pain Descriptors / Indicators  Sharp    Pain Type  Chronic pain                       [facility] Adult PT Treatment/Exercise - 09/03/18 0001      Manual Therapy    Manual Therapy  Soft tissue mobilization;Passive ROM    Manual therapy comments  prone    Soft tissue mobilization  L plantar surface and gastroc- manual STM and IASTM; most tenderness and soft tissue restriction in medial gastroc    Passive ROM  L DF + great toe extension stretch 2x30" to tolerance      Ankle Exercises: Aerobic   Nustep  L3 x 65min      Ankle Exercises: Seated   Towel Crunch  3 reps;Limitations   L foot; cues provided d/t difficulty   Other Seated Ankle Exercises  L great toe flexion + other toe extension 2x10   manual assistance to keep great toe down     Ankle Exercises: Stretches   Soleus Stretch  2 reps;30 seconds;Limitations    Soleus Stretch Limitations  L prostretch at Haematologist  2 reps;30 seconds;Limitations    Press photographer Limitations  L prostretch at counter top      Ankle Exercises: Standing   Other Standing Ankle Exercises  multi directional L DF stretch on step 5x3" hold   cues to keep heel down              PT Short Term Goals - 09/03/18 1036      PT SHORT TERM GOAL #1   Title  I with initial HEP     Time  3    Period  Weeks    Status  On-going      PT SHORT TERM GOAL #2   Title  decreased pain in L heel/foot by 30% or more to ease ADLS    Time  3    Period  Weeks    Status  On-going        PT Long Term Goals - 09/02/18 1044      PT LONG TERM GOAL #1   Title  Improved L ankle DF to 5 degress or better to normalize gait    Time  6    Period  Weeks    Status  On-going      PT LONG TERM GOAL #2   Title  patient able to ambulate with a normal gait pattern and pain 2/10 or less in the L foot/heel.    Time  6    Period  Weeks    Status  On-going      PT LONG TERM GOAL #3   Title  patient to demo 4+/5 strength in the L ankle.    Time  6    Period  Weeks    Status  On-going      PT LONG TERM GOAL #4   Title  patient to demo active L ankle ROM to [facility] to normalize ADLS    Time  6    Period  Weeks     Status  On-going            Plan - 09/03/18 1036    Clinical Impression Statement  Patient arrived to session with report of some soreness after last session. Tolerated manual STM and IASTM to L plantar surface of foot and gastroc- still with significant tenderness and soft tissue restriction in medial gastroc. No bruising or petechiae observed after manual therapy. Instructed patient on self-massage to this region to tolerance. Patient with difficulty performing towel curls and great toe flexion with other toes extension activities this session, indicating intrinsic muscle weakness. Ended session with gastroc and soleus stretching. Denied increased pain at end of session.     PT Treatment/Interventions  ADLs/Self Care Home Management;Cryotherapy;Electrical Stimulation;Iontophoresis 4mg /ml Dexamethasone;Ultrasound;Therapeutic exercise;Patient/family education;Manual techniques;Passive range of motion;Dry needling;Taping    Consulted and Agree with Plan of Care  Patient       Patient will benefit from skilled therapeutic intervention in order to improve the following deficits and impairments:  Abnormal gait, Pain, Decreased range of motion, Decreased strength, Impaired flexibility  Visit Diagnosis: Pain in left foot  Stiffness of left ankle, not elsewhere classified     Problem List Patient Active Problem List   Diagnosis Date Noted  . Prediabetes 05/27/2018  . Stomatitis 04/17/2017  . Dyslipidemia (high LDL; low HDL) 08/15/2016  . S/P arthroscopy of shoulder 05/31/2016  . History of prostate cancer 05/09/2016  . Complete rotator cuff tear of left shoulder 07/05/2015  . Tennis elbow 05/30/2014  . Rotator cuff (capsule) sprain 03/10/2014  . Right shoulder pain 02/11/2014  . Memory difficulty 12/18/2012  . Gynecomastia, male 07/30/2012  . OSA (obstructive sleep apnea) 04/23/2011  . EXTERNAL HEMORRHOIDS 02/12/2011  . Prostate cancer ([place]) 11/12/2010  . PLANTAR FASCIITIS  04/09/2010  . Hypothyroidism 12/08/2009  . NEOPLASM OF UNCERTAIN BEHAVIOR OF SKIN 03/10/2009  . OSTEOARTHRITIS 02/14/2009  . HEARING DEFICIT 02/08/2009  . Essential hypertension 02/08/2009  . Allergic rhinitis 02/08/2009  . GERD 02/08/2009  . PROTEINURIA 02/08/2009  . DIVERTICULITIS, HX OF 02/08/2009     Janene Harvey, PT, DPT 09/03/18 10:39 AM   [facility] [address]  [place], [place], [address] Phone: [phone]   Fax:  [phone]  Name: Zachary [name] MRN: [id number] Date of Birth: Aug 09, 1951

## 2018-09-08 ENCOUNTER — Ambulatory Visit: Payer: 59

## 2018-09-08 DIAGNOSIS — M25572 Pain in left ankle and joints of left foot: Secondary | ICD-10-CM | POA: Diagnosis not present

## 2018-09-08 DIAGNOSIS — M79672 Pain in left foot: Secondary | ICD-10-CM

## 2018-09-08 DIAGNOSIS — M25672 Stiffness of left ankle, not elsewhere classified: Secondary | ICD-10-CM | POA: Diagnosis not present

## 2018-09-08 NOTE — Therapy (Signed)
[facility] [address]  [place], [place], [address] Phone: [phone]   Fax:  [phone]  Physical Therapy Treatment  Patient Details  Name: Zachary [name] MRN: [id number] Date of Birth: 28-Dec-1950 Referring Provider: Dr. Francee Gentile   Encounter Date: 09/08/2018  PT End of Session - 09/08/18 0804    Visit Number  4    Number of Visits  18    Date for PT Re-Evaluation  10/13/18    Authorization Type  [facility]    PT Start Time  0800    PT Stop Time  0844    PT Time Calculation (min)  44 min    Activity Tolerance  Patient tolerated treatment well;Patient limited by pain    Behavior During Therapy  [facility] for tasks assessed/performed       Past Medical History:  Diagnosis Date  . Arthritis   . Articular cartilage disease    left shoulder  . Cancer ([place]) 2015   positive prostate cancer bx  . Diverticulitis   . Dyslipidemia (high LDL; low HDL) 08/15/2016  . GERD (gastroesophageal reflux disease)   . Hearing problem    hearing deficit  . Hemorrhoids   . Hypertension   . Hypothyroidism   . Sleep apnea    uses a cpap  . Wears glasses   . Wears hearing aid    both ears    Past Surgical History:  Procedure Laterality Date  . BACK SURGERY  1978   herniated disksurgery  . CARDIAC CATHETERIZATION  08/01/2010  . COLONOSCOPY    . EXAM UNDER ANESTHESIA WITH MANIPULATION OF SHOULDER Right 09/15/2014   Procedure: RIGHT SHOULDER MANIPULATION UNDER ANESTHESIA;  Surgeon: Ninetta Lights, MD;  Location: [facility];  Service: Orthopedics;  Laterality: Right;  . HEMORRHOID SURGERY  08/2006  . SHOULDER ARTHROSCOPY WITH DISTAL CLAVICLE RESECTION Left 07/07/2015   Procedure: SHOULDER ARTHROSCOPY WITH DISTAL CLAVICLE RESECTION;  Surgeon: Kathryne Hitch, MD;  Location: [facility];  Service: Orthopedics;  Laterality: Left;  . SHOULDER ARTHROSCOPY WITH ROTATOR CUFF REPAIR Left 12/07/2015    Procedure: LEFT SHOULDER ARTHROSCOPY DEBRIDEMENT, WITH ROTATOR CUFF REPAIR;  Surgeon: Ninetta Lights, MD;  Location: [facility];  Service: Orthopedics;  Laterality: Left;  . SHOULDER ARTHROSCOPY WITH ROTATOR CUFF REPAIR AND SUBACROMIAL DECOMPRESSION Left 07/07/2015   Procedure: LEFT SHOULDER SCOPE DEBRIDEMENT, SUBACROMIAL DECOMPRESSION, DISTAL CLAVICULECTOMY, ROTATOR CUFF REPAIR  ;  Surgeon: Kathryne Hitch, MD;  Location: [facility];  Service: Orthopedics;  Laterality: Left;  ANESTHESIA: GENERAL, PRE/POST OP SCALENE  . SHOULDER ARTHROSCOPY WITH SUBACROMIAL DECOMPRESSION, ROTATOR CUFF REPAIR AND BICEP TENDON REPAIR Right 03/10/2014   Procedure: RIGHT SHOULDER ARTHROSCOPY WITH SUBACROMIAL DECOMPRESSION, PARTIAL ACROMIOPLASTY WITH CORACOAROMIAL LABRUM DEBRIDEMENT RELEASE DISTAL CLAVICULECTOMY,  ROTATOR CUFF REPAIR AND EXTENSIVE DEBRIDEMENT;  Surgeon: Ninetta Lights, MD;  Location: [facility];  Service: Orthopedics;  Laterality: Right;  . TENDON REPAIR  June 06, 2011   right elbow, Dr. Percell Miller    There were no vitals filed for this visit.  Subjective Assessment - 09/08/18 0802    Subjective  Pt. noting, "my pain is still bad and I'm not sure it's getting any better".      Pertinent History  prostate CA 2015, HLD, GERD, hearing aids, HTN, hypothyroidism, cardiac cath,R shoulder RTC and biceps repair, R shoulder surgical manip, L shoulder RTC repair, R elbow tendon repair     Diagnostic tests  mri  Patient Stated Goals  to get rid of pain    Currently in Pain?  Yes    Pain Score  9     Pain Location  Foot    Pain Orientation  Left    Pain Descriptors / Indicators  Sharp    Pain Type  Chronic pain    Pain Onset  More than a month ago    Pain Frequency  Intermittent    Aggravating Factors   Walking     Pain Relieving Factors  Water bottle roll     Multiple Pain Sites  No                       [facility] Adult PT Treatment/Exercise -  09/08/18 0833      Ultrasound   Ultrasound Location  L plantar surface of foot proximal to heel     Ultrasound Parameters  3.3Mhz, intensity to pt. tolerance, .8Watts/cm2    Ultrasound Goals  Pain      Manual Therapy   Manual Therapy  Soft tissue mobilization;Passive ROM;Myofascial release    Manual therapy comments  prone    Soft tissue mobilization  L plantar surface and gastroc- manual STM and IASTM; most tenderness and soft tissue restriction in medial gastroc    Myofascial Release  TPR to L medial ankle with good response noted    Passive ROM  L DF + great toe extension stretch 2x30" to tolerance      Ankle Exercises: Aerobic   Nustep  L3 x 58min      Ankle Exercises: Stretches   Plantar Fascia Stretch  1 rep;60 seconds;Limitations    Plantar Fascia Stretch Limitations  L against wall    Soleus Stretch  2 reps;30 seconds;Limitations    Soleus Stretch Limitations  L prostretch at Haematologist  2 reps;30 seconds;Limitations    Gastroc Stretch Limitations  L prostretch at counter top      Ankle Exercises: Standing   Heel Raises  3 seconds;Both   x 12 reps    Heel Raises Limitations  slow eccentric                PT Short Term Goals - 09/03/18 1036      PT SHORT TERM GOAL #1   Title  I with initial HEP     Time  3    Period  Weeks    Status  On-going      PT SHORT TERM GOAL #2   Title  decreased pain in L heel/foot by 30% or more to ease ADLS    Time  3    Period  Weeks    Status  On-going        PT Long Term Goals - 09/02/18 1044      PT LONG TERM GOAL #1   Title  Improved L ankle DF to 5 degress or better to normalize gait    Time  6    Period  Weeks    Status  On-going      PT LONG TERM GOAL #2   Title  patient able to ambulate with a normal gait pattern and pain 2/10 or less in the L foot/heel.    Time  6    Period  Weeks    Status  On-going      PT LONG TERM GOAL #3   Title  patient to demo 4+/5 strength in the L ankle.  Time  6    Period  Weeks    Status  On-going      PT LONG TERM GOAL #4   Title  patient to demo active L ankle ROM to [facility] to normalize ADLS    Time  6    Period  Weeks    Status  On-going            Plan - 09/08/18 0804    Clinical Impression Statement  Zachary [name] noting limited relief from foot pain so far in therapy.  Does still get relief from water bottle roll to arch of foot at home.  Trialed ultrasound today to plantar surface of foot with pt. tolerating well.  Manual therapy focusing on TPR/DTM to medial calf in area of tenderness with good overall response.  Ended visit with LE stretching with good tolerance.  Will continue to progress toward goals.      PT Treatment/Interventions  ADLs/Self Care Home Management;Cryotherapy;Electrical Stimulation;Iontophoresis 4mg /ml Dexamethasone;Ultrasound;Therapeutic exercise;Patient/family education;Manual techniques;Passive range of motion;Dry needling;Taping    PT Home Exercise Plan  seated gastroc and soleus stretches with towel; seated toe stretch; ice bottle for MFR    Consulted and Agree with Plan of Care  Patient       Patient will benefit from skilled therapeutic intervention in order to improve the following deficits and impairments:  Abnormal gait, Pain, Decreased range of motion, Decreased strength, Impaired flexibility  Visit Diagnosis: Pain in left foot  Stiffness of left ankle, not elsewhere classified     Problem List Patient Active Problem List   Diagnosis Date Noted  . Prediabetes 05/27/2018  . Stomatitis 04/17/2017  . Dyslipidemia (high LDL; low HDL) 08/15/2016  . S/P arthroscopy of shoulder 05/31/2016  . History of prostate cancer 05/09/2016  . Complete rotator cuff tear of left shoulder 07/05/2015  . Tennis elbow 05/30/2014  . Rotator cuff (capsule) sprain 03/10/2014  . Right shoulder pain 02/11/2014  . Memory difficulty 12/18/2012  . Gynecomastia, male 07/30/2012  . OSA (obstructive sleep apnea)  04/23/2011  . EXTERNAL HEMORRHOIDS 02/12/2011  . Prostate cancer ([place]) 11/12/2010  . PLANTAR FASCIITIS 04/09/2010  . Hypothyroidism 12/08/2009  . NEOPLASM OF UNCERTAIN BEHAVIOR OF SKIN 03/10/2009  . OSTEOARTHRITIS 02/14/2009  . HEARING DEFICIT 02/08/2009  . Essential hypertension 02/08/2009  . Allergic rhinitis 02/08/2009  . GERD 02/08/2009  . PROTEINURIA 02/08/2009  . DIVERTICULITIS, HX OF 02/08/2009    Bess Harvest, PTA 09/08/18 12:51 PM  [facility] [address]  [place], [place], [address] Phone: [phone]   Fax:  [phone]  Name: Zachary [name] MRN: [id number] Date of Birth: 06-18-1951

## 2018-09-09 ENCOUNTER — Ambulatory Visit: Payer: 59 | Admitting: Physical Therapy

## 2018-09-09 ENCOUNTER — Other Ambulatory Visit: Payer: Self-pay | Admitting: Family Medicine

## 2018-09-09 DIAGNOSIS — M79672 Pain in left foot: Secondary | ICD-10-CM

## 2018-09-09 DIAGNOSIS — M25672 Stiffness of left ankle, not elsewhere classified: Secondary | ICD-10-CM

## 2018-09-09 DIAGNOSIS — M25572 Pain in left ankle and joints of left foot: Secondary | ICD-10-CM | POA: Diagnosis not present

## 2018-09-09 MED FILL — PRAVASTATIN NA 40 MG TAB: 40 | 90 days supply | Qty: 45 | Fill #0

## 2018-09-09 NOTE — Therapy (Signed)
[facility] [address]  [place], [place], [address] Phone: [phone]   Fax:  [phone]  Physical Therapy Treatment  Patient Details  Name: Zachary [name] MRN: [id number] Date of Birth: 1951-05-17 Referring Provider: Dr. Francee Gentile   Encounter Date: 09/09/2018  PT End of Session - 09/09/18 0932    Visit Number  5    Number of Visits  18    Date for PT Re-Evaluation  10/13/18    Authorization Type  [facility]    PT Start Time  0932    PT Stop Time  1018    PT Time Calculation (min)  46 min    Activity Tolerance  Patient limited by pain    Behavior During Therapy  [facility] for tasks assessed/performed       Past Medical History:  Diagnosis Date  . Arthritis   . Articular cartilage disease    left shoulder  . Cancer ([place]) 2015   positive prostate cancer bx  . Diverticulitis   . Dyslipidemia (high LDL; low HDL) 08/15/2016  . GERD (gastroesophageal reflux disease)   . Hearing problem    hearing deficit  . Hemorrhoids   . Hypertension   . Hypothyroidism   . Sleep apnea    uses a cpap  . Wears glasses   . Wears hearing aid    both ears    Past Surgical History:  Procedure Laterality Date  . BACK SURGERY  1978   herniated disksurgery  . CARDIAC CATHETERIZATION  08/01/2010  . COLONOSCOPY    . EXAM UNDER ANESTHESIA WITH MANIPULATION OF SHOULDER Right 09/15/2014   Procedure: RIGHT SHOULDER MANIPULATION UNDER ANESTHESIA;  Surgeon: Ninetta Lights, MD;  Location: [place];  Service: Orthopedics;  Laterality: Right;  . HEMORRHOID SURGERY  08/2006  . SHOULDER ARTHROSCOPY WITH DISTAL CLAVICLE RESECTION Left 07/07/2015   Procedure: SHOULDER ARTHROSCOPY WITH DISTAL CLAVICLE RESECTION;  Surgeon: Kathryne Hitch, MD;  Location: [place];  Service: Orthopedics;  Laterality: Left;  . SHOULDER ARTHROSCOPY WITH ROTATOR CUFF REPAIR Left 12/07/2015   Procedure: LEFT SHOULDER ARTHROSCOPY  DEBRIDEMENT, WITH ROTATOR CUFF REPAIR;  Surgeon: Ninetta Lights, MD;  Location: [place];  Service: Orthopedics;  Laterality: Left;  . SHOULDER ARTHROSCOPY WITH ROTATOR CUFF REPAIR AND SUBACROMIAL DECOMPRESSION Left 07/07/2015   Procedure: LEFT SHOULDER SCOPE DEBRIDEMENT, SUBACROMIAL DECOMPRESSION, DISTAL CLAVICULECTOMY, ROTATOR CUFF REPAIR  ;  Surgeon: Kathryne Hitch, MD;  Location: [place];  Service: Orthopedics;  Laterality: Left;  ANESTHESIA: GENERAL, PRE/POST OP SCALENE  . SHOULDER ARTHROSCOPY WITH SUBACROMIAL DECOMPRESSION, ROTATOR CUFF REPAIR AND BICEP TENDON REPAIR Right 03/10/2014   Procedure: RIGHT SHOULDER ARTHROSCOPY WITH SUBACROMIAL DECOMPRESSION, PARTIAL ACROMIOPLASTY WITH CORACOAROMIAL LABRUM DEBRIDEMENT RELEASE DISTAL CLAVICULECTOMY,  ROTATOR CUFF REPAIR AND EXTENSIVE DEBRIDEMENT;  Surgeon: Ninetta Lights, MD;  Location: [place];  Service: Orthopedics;  Laterality: Right;  . TENDON REPAIR  June 06, 2011   right elbow, Dr. Percell Miller    There were no vitals filed for this visit.  Subjective Assessment - 09/09/18 0934    Subjective  Pt reports his Lt foot was burning the rest of afternoon after treatment.  He has been icing bottom of foot 3x/ day.  Not much change since starting therapy.  He doesn't "understand why the Dr's don't think having a bone spur is important"   Currently in Pain?  Yes    Pain Score  8  Pain Location  Foot    Pain Orientation  Left    Pain Descriptors / Indicators  Sharp    Aggravating Factors   walking     Pain Relieving Factors  ice          [facility] PT Assessment - 09/09/18 0001      Assessment   Medical Diagnosis  L foot plantar faciitis    Referring Provider  Dr. Francee Gentile    Onset Date/Surgical Date  02/06/18    Next MD Visit  PRN      AROM   AROM Assessment Site  Ankle    Right/Left Ankle  Left    Left Ankle Dorsiflexion  0        [facility] Adult PT Treatment/Exercise - 09/09/18 0001       Ultrasound   Ultrasound Location  tissue surrounding Lt calcaneus at plantar surface    Ultrasound Parameters  3.3 mHz, 0.8-1.0 w/cm2, 50%, 8 min     Ultrasound Goals  Pain      Manual Therapy   Manual Therapy  Taping;Soft tissue mobilization    Manual therapy comments  I strip of Reg [place] tape applied to [employer] post tib (arch to mid shin), I strips applied to plantar surface of Lt foot with 25% stretch and 2 perpendicular strips applied to arch with 50% stretch - to decmpress tissue, provide support and decrease pain.     Soft tissue mobilization  STM to Lt gastroc/soleus and post tib; gentle STM to plantar fascia       Ankle Exercises: Aerobic   Nustep  L5 x 55min      Ankle Exercises: Stretches   Plantar Fascia Stretch  2 reps;20 seconds   each leg   Soleus Stretch  2 reps;30 seconds   each leg   Gastroc Stretch  2 reps;30 seconds   cues to keep heel down and not bounce, each leg     Ankle Exercises: Standing   SLS  Rt/Lt SLS - up to 22 on RLE and 10 sec,  2 reps each side    Heel Raises  --   held.      Ankle Exercises: Seated   Ankle Circles/Pumps  AROM;Left;10 reps   2 sets              PT Short Term Goals - 09/03/18 1036      PT SHORT TERM GOAL #1   Title  I with initial HEP     Time  3    Period  Weeks    Status  On-going      PT SHORT TERM GOAL #2   Title  decreased pain in L heel/foot by 30% or more to ease ADLS    Time  3    Period  Weeks    Status  On-going        PT Long Term Goals - 09/02/18 1044      PT LONG TERM GOAL #1   Title  Improved L ankle DF to 5 degress or better to normalize gait    Time  6    Period  Weeks    Status  On-going      PT LONG TERM GOAL #2   Title  patient able to ambulate with a normal gait pattern and pain 2/10 or less in the L foot/heel.    Time  6    Period  Weeks    Status  On-going  PT LONG TERM GOAL #3   Title  patient to demo 4+/5 strength in the L ankle.    Time  6    Period  Weeks    Status   On-going      PT LONG TERM GOAL #4   Title  patient to demo active L ankle ROM to [facility] to normalize ADLS    Time  6    Period  Weeks    Status  On-going            Plan - 09/09/18 1247    Clinical Impression Statement  Pt demonstrated improved Lt ankle DF ROM this visit.   He tolerated stretches, SLS, and AROM without increase in pain.  Pt reported minimal change in symptoms by end of session, however reported he liked the support the tape had provided.  Pt progressing gradually towards goals.     Rehab Potential  Good    PT Frequency  3x / week    PT Duration  6 weeks    PT Treatment/Interventions  ADLs/Self Care Home Management;Cryotherapy;Electrical Stimulation;Iontophoresis 4mg /ml Dexamethasone;Ultrasound;Therapeutic exercise;Patient/family education;Manual techniques;Passive range of motion;Dry needling;Taping    PT Next Visit Plan  ionto trial; assess response to Rock tape application (can teach pt technique so he can apply it at home).      Consulted and Agree with Plan of Care  Patient       Patient will benefit from skilled therapeutic intervention in order to improve the following deficits and impairments:  Abnormal gait, Pain, Decreased range of motion, Decreased strength, Impaired flexibility  Visit Diagnosis: Pain in left foot  Stiffness of left ankle, not elsewhere classified     Problem List Patient Active Problem List   Diagnosis Date Noted  . Prediabetes 05/27/2018  . Stomatitis 04/17/2017  . Dyslipidemia (high LDL; low HDL) 08/15/2016  . S/P arthroscopy of shoulder 05/31/2016  . History of prostate cancer 05/09/2016  . Complete rotator cuff tear of left shoulder 07/05/2015  . Tennis elbow 05/30/2014  . Rotator cuff (capsule) sprain 03/10/2014  . Right shoulder pain 02/11/2014  . Memory difficulty 12/18/2012  . Gynecomastia, male 07/30/2012  . OSA (obstructive sleep apnea) 04/23/2011  . EXTERNAL HEMORRHOIDS 02/12/2011  . Prostate cancer ([place])  11/12/2010  . PLANTAR FASCIITIS 04/09/2010  . Hypothyroidism 12/08/2009  . NEOPLASM OF UNCERTAIN BEHAVIOR OF SKIN 03/10/2009  . OSTEOARTHRITIS 02/14/2009  . HEARING DEFICIT 02/08/2009  . Essential hypertension 02/08/2009  . Allergic rhinitis 02/08/2009  . GERD 02/08/2009  . PROTEINURIA 02/08/2009  . DIVERTICULITIS, HX OF 02/08/2009   Kerin Perna, PTA 09/09/18 1:24 PM  [facility] [address]  [place], [place], [address] Phone: [phone]   Fax:  [phone]  Name: Zachary [name] MRN: [id number] Date of Birth: [birth year]

## 2018-09-10 ENCOUNTER — Encounter: Payer: Self-pay | Admitting: Physical Therapy

## 2018-09-10 ENCOUNTER — Ambulatory Visit: Payer: 59 | Admitting: Physical Therapy

## 2018-09-10 VITALS — BP 140/90

## 2018-09-10 DIAGNOSIS — M25672 Stiffness of left ankle, not elsewhere classified: Secondary | ICD-10-CM | POA: Diagnosis not present

## 2018-09-10 DIAGNOSIS — M25572 Pain in left ankle and joints of left foot: Secondary | ICD-10-CM | POA: Diagnosis not present

## 2018-09-10 DIAGNOSIS — M79672 Pain in left foot: Secondary | ICD-10-CM | POA: Diagnosis not present

## 2018-09-10 NOTE — Therapy (Signed)
[facility] [address]  [place], [place], [address] Phone: [phone]   Fax:  [phone]  Physical Therapy Treatment  Patient Details  Name: Zachary [name] MRN: [id number] Date of Birth: Apr 23, 1951 Referring Provider: Dr. Francee Gentile   Encounter Date: 09/10/2018  PT End of Session - 09/10/18 0930    Visit Number  6    Number of Visits  18    Date for PT Re-Evaluation  10/13/18    Authorization Type  [facility]    PT Start Time  0845    PT Stop Time  0926    PT Time Calculation (min)  41 min    Activity Tolerance  Patient limited by pain;Patient tolerated treatment well    Behavior During Therapy  [facility] for tasks assessed/performed       Past Medical History:  Diagnosis Date  . Arthritis   . Articular cartilage disease    left shoulder  . Cancer ([place]) 2015   positive prostate cancer bx  . Diverticulitis   . Dyslipidemia (high LDL; low HDL) 08/15/2016  . GERD (gastroesophageal reflux disease)   . Hearing problem    hearing deficit  . Hemorrhoids   . Hypertension   . Hypothyroidism   . Sleep apnea    uses a cpap  . Wears glasses   . Wears hearing aid    both ears    Past Surgical History:  Procedure Laterality Date  . BACK SURGERY  1978   herniated disksurgery  . CARDIAC CATHETERIZATION  08/01/2010  . COLONOSCOPY    . EXAM UNDER ANESTHESIA WITH MANIPULATION OF SHOULDER Right 09/15/2014   Procedure: RIGHT SHOULDER MANIPULATION UNDER ANESTHESIA;  Surgeon: Ninetta Lights, MD;  Location: [place];  Service: Orthopedics;  Laterality: Right;  . HEMORRHOID SURGERY  08/2006  . SHOULDER ARTHROSCOPY WITH DISTAL CLAVICLE RESECTION Left 07/07/2015   Procedure: SHOULDER ARTHROSCOPY WITH DISTAL CLAVICLE RESECTION;  Surgeon: Kathryne Hitch, MD;  Location: [place];  Service: Orthopedics;  Laterality: Left;  . SHOULDER ARTHROSCOPY WITH ROTATOR CUFF REPAIR Left 12/07/2015    Procedure: LEFT SHOULDER ARTHROSCOPY DEBRIDEMENT, WITH ROTATOR CUFF REPAIR;  Surgeon: Ninetta Lights, MD;  Location: [place];  Service: Orthopedics;  Laterality: Left;  . SHOULDER ARTHROSCOPY WITH ROTATOR CUFF REPAIR AND SUBACROMIAL DECOMPRESSION Left 07/07/2015   Procedure: LEFT SHOULDER SCOPE DEBRIDEMENT, SUBACROMIAL DECOMPRESSION, DISTAL CLAVICULECTOMY, ROTATOR CUFF REPAIR  ;  Surgeon: Kathryne Hitch, MD;  Location: [place];  Service: Orthopedics;  Laterality: Left;  ANESTHESIA: GENERAL, PRE/POST OP SCALENE  . SHOULDER ARTHROSCOPY WITH SUBACROMIAL DECOMPRESSION, ROTATOR CUFF REPAIR AND BICEP TENDON REPAIR Right 03/10/2014   Procedure: RIGHT SHOULDER ARTHROSCOPY WITH SUBACROMIAL DECOMPRESSION, PARTIAL ACROMIOPLASTY WITH CORACOAROMIAL LABRUM DEBRIDEMENT RELEASE DISTAL CLAVICULECTOMY,  ROTATOR CUFF REPAIR AND EXTENSIVE DEBRIDEMENT;  Surgeon: Ninetta Lights, MD;  Location: [place];  Service: Orthopedics;  Laterality: Right;  . TENDON REPAIR  June 06, 2011   right elbow, Dr. Percell Miller    Vitals:   09/10/18 0849 09/10/18 0925  BP: 138/90 140/90    Subjective Assessment - 09/10/18 0849    Subjective  Reports he is not feeling much different. Wore ktape for about a day but it seemed to aggravate the pain.     Pertinent History  prostate CA 2015, HLD, GERD, hearing aids, HTN, hypothyroidism, cardiac cath,R shoulder RTC and biceps repair, R shoulder surgical manip, L shoulder RTC repair, R elbow tendon  repair     Diagnostic tests  mri    Patient Stated Goals  to get rid of pain    Currently in Pain?  Yes    Pain Score  8     Pain Location  Foot    Pain Orientation  Left    Pain Descriptors / Indicators  Sharp    Pain Type  Chronic pain                       [facility] Adult PT Treatment/Exercise - 09/10/18 0001      Manual Therapy   Soft tissue mobilization  L plantar surface with significant tenderness surronding heel, L gastroc  with most tenderness and soft tissue restriction medially    Myofascial Release  TPR to L medial gastroc- tender    Passive ROM  L DF + great toe extension stretch 2x30" to tolerance      Ankle Exercises: Aerobic   Stationary Bike  L3 x 6 min      Ankle Exercises: Stretches   Plantar Fascia Stretch  2 reps;20 seconds    Soleus Stretch  2 reps;30 seconds    Gastroc Stretch  2 reps;30 seconds      Ankle Exercises: Standing   Heel Raises Limitations  2x10; slow eccentric lower with wall support    Toe Raise Limitations  toe walking 2x29ft limited range d/t pain    Other Standing Ankle Exercises  L great toe extension stretch against dumbell 10x5"    Other Standing Ankle Exercises  L self-DF stretch on step stool neutral, INV, EV to tol 5x5" each direction               PT Short Term Goals - 09/10/18 0936      PT SHORT TERM GOAL #1   Title  I with initial HEP     Time  3    Period  Weeks    Status  Achieved      PT SHORT TERM GOAL #2   Title  decreased pain in L heel/foot by 30% or more to ease ADLS    Time  3    Period  Weeks    Status  On-going        PT Long Term Goals - 09/02/18 1044      PT LONG TERM GOAL #1   Title  Improved L ankle DF to 5 degress or better to normalize gait    Time  6    Period  Weeks    Status  On-going      PT LONG TERM GOAL #2   Title  patient able to ambulate with a normal gait pattern and pain 2/10 or less in the L foot/heel.    Time  6    Period  Weeks    Status  On-going      PT LONG TERM GOAL #3   Title  patient to demo 4+/5 strength in the L ankle.    Time  6    Period  Weeks    Status  On-going      PT LONG TERM GOAL #4   Title  patient to demo active L ankle ROM to [facility] to normalize ADLS    Time  6    Period  Weeks    Status  On-going            Plan - 09/10/18 0931    Clinical Impression Statement  Patient  arrived to session with report of feeling no different since last session. Still with severe L heel  pain today. Advised patient to contact his MD if concerned about lack of progress made with PT. Patient still with significant tenderness in L heel and medial gastroc with STM. Corrected stretching form today for L gastroc, soleus, and plantar fascia. Worked on increasing great toe extension with good tolerance. Progressed gastroc strengthening ther-ex without report of increased pain. Patient without report of increased pain at end of session. Patient mentioning at end of session that he fell 2x over the weekend d/t dizziness when standing up quickly. Took BP- negative for orthostatics; denied spinning sensation during these episodes. Advised patient to monitor symptoms and perform these transfers slowly and follow up with MD if this continues.     PT Treatment/Interventions  ADLs/Self Care Home Management;Cryotherapy;Electrical Stimulation;Iontophoresis 4mg /ml Dexamethasone;Ultrasound;Therapeutic exercise;Patient/family education;Manual techniques;Passive range of motion;Dry needling;Taping    Consulted and Agree with Plan of Care  Patient       Patient will benefit from skilled therapeutic intervention in order to improve the following deficits and impairments:  Abnormal gait, Pain, Decreased range of motion, Decreased strength, Impaired flexibility  Visit Diagnosis: Pain in left foot  Stiffness of left ankle, not elsewhere classified     Problem List Patient Active Problem List   Diagnosis Date Noted  . Prediabetes 05/27/2018  . Stomatitis 04/17/2017  . Dyslipidemia (high LDL; low HDL) 08/15/2016  . S/P arthroscopy of shoulder 05/31/2016  . History of prostate cancer 05/09/2016  . Complete rotator cuff tear of left shoulder 07/05/2015  . Tennis elbow 05/30/2014  . Rotator cuff (capsule) sprain 03/10/2014  . Right shoulder pain 02/11/2014  . Memory difficulty 12/18/2012  . Gynecomastia, male 07/30/2012  . OSA (obstructive sleep apnea) 04/23/2011  . EXTERNAL HEMORRHOIDS 02/12/2011   . Prostate cancer ([place]) 11/12/2010  . PLANTAR FASCIITIS 04/09/2010  . Hypothyroidism 12/08/2009  . NEOPLASM OF UNCERTAIN BEHAVIOR OF SKIN 03/10/2009  . OSTEOARTHRITIS 02/14/2009  . HEARING DEFICIT 02/08/2009  . Essential hypertension 02/08/2009  . Allergic rhinitis 02/08/2009  . GERD 02/08/2009  . PROTEINURIA 02/08/2009  . DIVERTICULITIS, HX OF 02/08/2009    Janene Harvey, PT, DPT 09/10/18 9:40 AM   [facility] [address]  [place], [place], [address] Phone: [phone]   Fax:  [phone]  Name: BOCEPHUS CALI MRN: [id number] Date of Birth: [birth year]

## 2018-09-14 ENCOUNTER — Encounter: Payer: Self-pay | Admitting: Physical Therapy

## 2018-09-14 ENCOUNTER — Ambulatory Visit: Payer: 59 | Admitting: Physical Therapy

## 2018-09-14 DIAGNOSIS — M79672 Pain in left foot: Secondary | ICD-10-CM | POA: Diagnosis not present

## 2018-09-14 DIAGNOSIS — M25672 Stiffness of left ankle, not elsewhere classified: Secondary | ICD-10-CM | POA: Diagnosis not present

## 2018-09-14 DIAGNOSIS — M25572 Pain in left ankle and joints of left foot: Secondary | ICD-10-CM | POA: Diagnosis not present

## 2018-09-14 DIAGNOSIS — G4733 Obstructive sleep apnea (adult) (pediatric): Secondary | ICD-10-CM | POA: Diagnosis not present

## 2018-09-14 NOTE — Therapy (Addendum)
[facility] [place] [address]  [address] [place], [place], [address] Phone: [phone]   Fax:  [phone]  Physical Therapy Treatment  Patient Details  Name: Zachary [name] MRN: [id number] Date of Birth: 02/02/1951 Referring Provider: Dr. Aaron Scott   Encounter Date: 09/14/2018  PT End of Session - 09/14/18 0917    Visit Number  7    Number of Visits  18    Date for PT Re-Evaluation  10/13/18    Authorization Type  [facility]    PT Start Time  0844    PT Stop Time  0927    PT Time Calculation (min)  43 min    Activity Tolerance  Patient limited by pain;Patient tolerated treatment well    Behavior During Therapy  WFL for tasks assessed/performed       Past Medical History:  Diagnosis Date  . Arthritis   . Articular cartilage disease    left shoulder  . Cancer (HCC) 2015   positive prostate cancer bx  . Diverticulitis   . Dyslipidemia (high LDL; low HDL) 08/15/2016  . GERD (gastroesophageal reflux disease)   . Hearing problem    hearing deficit  . Hemorrhoids   . Hypertension   . Hypothyroidism   . Sleep apnea    uses a cpap  . Wears glasses   . Wears hearing aid    both ears    Past Surgical History:  Procedure Laterality Date  . BACK SURGERY  1978   herniated disksurgery  . CARDIAC CATHETERIZATION  08/01/2010  . COLONOSCOPY    . EXAM UNDER ANESTHESIA WITH MANIPULATION OF SHOULDER Right 09/15/2014   Procedure: RIGHT SHOULDER MANIPULATION UNDER ANESTHESIA;  Surgeon: Daniel F Murphy, MD;  Location: [facility];  Service: Orthopedics;  Laterality: Right;  . HEMORRHOID SURGERY  08/2006  . SHOULDER ARTHROSCOPY WITH DISTAL CLAVICLE RESECTION Left 07/07/2015   Procedure: SHOULDER ARTHROSCOPY WITH DISTAL CLAVICLE RESECTION;  Surgeon: Daniel Murphy, MD;  Location: [facility];  Service: Orthopedics;  Laterality: Left;  . SHOULDER ARTHROSCOPY WITH ROTATOR CUFF REPAIR Left 12/07/2015   Procedure: LEFT SHOULDER ARTHROSCOPY DEBRIDEMENT, WITH ROTATOR CUFF REPAIR;  Surgeon: Daniel F Murphy, MD;  Location: [facility];  Service: Orthopedics;  Laterality: Left;  . SHOULDER ARTHROSCOPY WITH ROTATOR CUFF REPAIR AND SUBACROMIAL DECOMPRESSION Left 07/07/2015   Procedure: LEFT SHOULDER SCOPE DEBRIDEMENT, SUBACROMIAL DECOMPRESSION, DISTAL CLAVICULECTOMY, ROTATOR CUFF REPAIR  ;  Surgeon: Daniel Murphy, MD;  Location: [facility];  Service: Orthopedics;  Laterality: Left;  ANESTHESIA: GENERAL, PRE/POST OP SCALENE  . SHOULDER ARTHROSCOPY WITH SUBACROMIAL DECOMPRESSION, ROTATOR CUFF REPAIR AND BICEP TENDON REPAIR Right 03/10/2014   Procedure: RIGHT SHOULDER ARTHROSCOPY WITH SUBACROMIAL DECOMPRESSION, PARTIAL ACROMIOPLASTY WITH CORACOAROMIAL LABRUM DEBRIDEMENT RELEASE DISTAL CLAVICULECTOMY,  ROTATOR CUFF REPAIR AND EXTENSIVE DEBRIDEMENT;  Surgeon: Daniel F Murphy, MD;  Location:  SURGERY CENTER;  Service: Orthopedics;  Laterality: Right;  . TENDON REPAIR  June 06, 2011   right elbow, Dr. Murphy    There were no vitals filed for this visit.  Subjective Assessment - 09/14/18 0845    Subjective  Reports nothing is new. Notes no improvement since start of POC. Concered about heel spur in L foot as cause of his pain.     Pertinent History  prostate CA 2015, HLD, GERD, hearing aids, HTN, hypothyroidism, cardiac cath,R shoulder RTC and biceps repair, R shoulder surgical manip, L shoulder RTC repair, R elbow tendon repair       Diagnostic tests  mri    Patient Stated Goals  to get rid of pain    Currently in Pain?  Yes    Pain Score  8     Pain Location  Foot    Pain Orientation  Left    Pain Descriptors / Indicators  Sharp    Pain Type  Chronic pain         [facility] PT Assessment - 09/14/18 0001      AROM   AROM Assessment Site  Ankle    Right/Left Ankle  Left    Left Ankle Dorsiflexion  3    Left Ankle Plantar Flexion  60    Left Ankle Inversion  10     Left Ankle Eversion  7      Strength   Strength Assessment Site  Ankle    Right/Left Ankle  Left    Left Ankle Dorsiflexion  4/5    Left Ankle Plantar Flexion  4/5   pain in heel   Left Ankle Inversion  4-/5   pain in medial arch   Left Ankle Eversion  4-/5                   [facility] Adult PT Treatment/Exercise - 09/14/18 0001      Modalities   Modalities  Vasopneumatic      Vasopneumatic   Number Minutes Vasopneumatic   15 minutes    Vasopnuematic Location   Ankle   L   Vasopneumatic Pressure  Medium    Vasopneumatic Temperature   Coldest temperature      Ankle Exercises: Aerobic   Nustep  L3 x 5mn      Ankle Exercises: Supine   Other Supine Ankle Exercises  4 way ankle 10x each direction on L LE; red TB DF/PF, yellow TB INV/EV   manual cues to encourage proper motion     Ankle Exercises: Seated   Other Seated Ankle Exercises  L great toe flexion + other toes extension 10x   manual cues to encourage great toe flexion   Other Seated Ankle Exercises  L toes flexion against manual resistance to tol 10x      Ankle Exercises: Stretches   Soleus Stretch  2 reps;30 seconds   against wall   Gastroc Stretch  2 reps;30 seconds   against wall     Ankle Exercises: Standing   Heel Raises  Both;10 reps;Limitations    Heel Raises Limitations  2x10 with B chair support to tol               PT Short Term Goals - 09/14/18 0845      PT SHORT TERM GOAL #1   Title  I with initial HEP     Time  3    Period  Weeks    Status  Achieved      PT SHORT TERM GOAL #2   Title  decreased pain in L heel/foot by 30% or more to ease ADLS    Time  3    Period  Weeks    Status  On-going   notes no improvement in pain levels       PT Long Term Goals - 09/14/18 0846      PT LONG TERM GOAL #1   Title  Improved L ankle DF to 5 degress or better to normalize gait    Time  6    Period  Weeks    Status  On-going  L ankle DF increased 3 degrees at this time     PT  LONG TERM GOAL #2   Title  patient able to ambulate with a normal gait pattern and pain 2/10 or less in the L foot/heel.    Time  6    Period  Weeks    Status  On-going   patient still ambulating with antalgic gait with increase in pain past 8/10 baseline pain     PT LONG TERM GOAL #3   Title  patient to demo 4+/5 strength in the L ankle.    Time  6    Period  Weeks    Status  On-going   stiil limited, see objective measures     PT LONG TERM GOAL #4   Title  patient to demo active L ankle ROM to WFL to normalize ADLS    Time  6    Period  Weeks    Status  On-going   L ankle AROM limited in DF, INV, EV and with pain           Plan - 09/14/18 0918    Clinical Impression Statement  Patient arrived to session with report of no improvement since start of POC- concerned about heel spur in L foot as possible cause of his pain. Reports he has F/U with MD tomorrow to discuss this. Updated goals- patient with small improvement in L PF strength, otherwise unchanged strength testing. Improvement in L ankle DF and PF AROM. Patient still ambulating with antalgic gait pattern with report of increase in L heel pain from 8/10 baseline pain. Patient tolerated L ankle resisted ankle ROM with banded resistance. Having difficulty performing toe flexion, mildly improved with manual assistance and resistance. Patient still with L heel pain at end of session- received [place] to L foot at end of session for pain relief. Patient showing limited improvement in symptoms at this time despite reporting compliance with HEP. May benefit from next level of care- MD to please advise.     PT Treatment/Interventions  ADLs/Self Care Home Management;Cryotherapy;Electrical Stimulation;Iontophoresis 4mg/ml Dexamethasone;Ultrasound;Therapeutic exercise;Patient/family education;Manual techniques;Passive range of motion;Dry needling;Taping    Consulted and Agree with Plan of Care  Patient       Patient will benefit from  skilled therapeutic intervention in order to improve the following deficits and impairments:  Abnormal gait, Pain, Decreased range of motion, Decreased strength, Impaired flexibility  Visit Diagnosis: Pain in left foot  Stiffness of left ankle, not elsewhere classified     Problem List Patient Active Problem List   Diagnosis Date Noted  . Prediabetes 05/27/2018  . Stomatitis 04/17/2017  . Dyslipidemia (high LDL; low HDL) 08/15/2016  . S/P arthroscopy of shoulder 05/31/2016  . History of prostate cancer 05/09/2016  . Complete rotator cuff tear of left shoulder 07/05/2015  . Tennis elbow 05/30/2014  . Rotator cuff (capsule) sprain 03/10/2014  . Right shoulder pain 02/11/2014  . Memory difficulty 12/18/2012  . Gynecomastia, male 07/30/2012  . OSA (obstructive sleep apnea) 04/23/2011  . EXTERNAL HEMORRHOIDS 02/12/2011  . Prostate cancer (HCC) 11/12/2010  . PLANTAR FASCIITIS 04/09/2010  . Hypothyroidism 12/08/2009  . NEOPLASM OF UNCERTAIN BEHAVIOR OF SKIN 03/10/2009  . OSTEOARTHRITIS 02/14/2009  . HEARING DEFICIT 02/08/2009  . Essential hypertension 02/08/2009  . Allergic rhinitis 02/08/2009  . GERD 02/08/2009  . PROTEINURIA 02/08/2009  . DIVERTICULITIS, HX OF 02/08/2009     , PT, DPT 09/14/18 11:50 AM   [facility] [place] [address]  [place], [place], [address] Phone: [phone]   Fax:  [phone]  Name: FAOLAN SPRINGFIELD MRN: [id number] Date of Birth: 03-22-1951  PHYSICAL THERAPY DISCHARGE SUMMARY  Visits from Start of Care: 7  Current functional level related to goals / functional outcomes: See above clinical impression; MD requesting that patient discontinue PT d/t lack of progress    Remaining deficits: Strength and ROM deficits, gait deviations, pain   Education / Equipment: HEP  Plan: Patient agrees to discharge.  Patient goals were not met. Patient is being discharged due  to the physician's request.  ?????     Janene Harvey, PT, DPT 09/21/18 2:30 PM

## 2018-09-15 DIAGNOSIS — G4733 Obstructive sleep apnea (adult) (pediatric): Secondary | ICD-10-CM | POA: Diagnosis not present

## 2018-09-15 DIAGNOSIS — M722 Plantar fascial fibromatosis: Secondary | ICD-10-CM | POA: Diagnosis not present

## 2018-09-15 DIAGNOSIS — Z885 Allergy status to narcotic agent status: Secondary | ICD-10-CM | POA: Diagnosis not present

## 2018-09-15 DIAGNOSIS — Z888 Allergy status to other drugs, medicaments and biological substances status: Secondary | ICD-10-CM | POA: Diagnosis not present

## 2018-09-16 ENCOUNTER — Ambulatory Visit: Payer: 59

## 2018-09-17 ENCOUNTER — Ambulatory Visit: Payer: 59 | Admitting: Physical Therapy

## 2018-09-21 ENCOUNTER — Encounter: Payer: 59 | Admitting: Physical Therapy

## 2018-09-24 ENCOUNTER — Encounter: Payer: 59 | Admitting: Physical Therapy

## 2018-09-28 ENCOUNTER — Encounter: Payer: 59 | Admitting: Physical Therapy

## 2018-09-30 ENCOUNTER — Encounter: Payer: 59 | Admitting: Physical Therapy

## 2018-10-01 ENCOUNTER — Encounter: Payer: 59 | Admitting: Physical Therapy

## 2018-10-07 ENCOUNTER — Other Ambulatory Visit: Payer: Self-pay | Admitting: Family Medicine

## 2018-10-07 DIAGNOSIS — I10 Essential (primary) hypertension: Secondary | ICD-10-CM

## 2018-10-07 MED FILL — OMEPRAZOLE 40 MG CPDR: 40 | 30 days supply | Qty: 30 | Fill #1

## 2018-10-07 MED FILL — LEVOTHYROXINE 100 MCG TAB: 100 | 90 days supply | Qty: 90 | Fill #0

## 2018-10-07 MED FILL — LISINOPRIL 5 MG TABLET: 5 | 90 days supply | Qty: 90 | Fill #0

## 2018-10-08 ENCOUNTER — Encounter: Payer: 59 | Admitting: Physical Therapy

## 2018-10-13 DIAGNOSIS — M722 Plantar fascial fibromatosis: Secondary | ICD-10-CM | POA: Diagnosis not present

## 2018-10-14 ENCOUNTER — Other Ambulatory Visit: Payer: Self-pay | Admitting: Family Medicine

## 2018-10-15 MED FILL — SODIUM BICARB 10 GRAIN TAB: 650 | 90 days supply | Qty: 360 | Fill #0

## 2018-10-26 DIAGNOSIS — M722 Plantar fascial fibromatosis: Secondary | ICD-10-CM | POA: Diagnosis not present

## 2018-10-26 DIAGNOSIS — M6702 Short Achilles tendon (acquired), left ankle: Secondary | ICD-10-CM | POA: Insufficient documentation

## 2018-10-26 MED FILL — MELOXICAM 15 MG TABLET: 15 | 40 days supply | Qty: 40 | Fill #0

## 2018-10-29 ENCOUNTER — Ambulatory Visit (INDEPENDENT_AMBULATORY_CARE_PROVIDER_SITE_OTHER): Payer: 59 | Admitting: Family Medicine

## 2018-10-29 ENCOUNTER — Encounter: Payer: Self-pay | Admitting: Family Medicine

## 2018-10-29 VITALS — BP 108/76 | HR 61 | Temp 98.2°F | Ht 68.0 in | Wt 200.0 lb

## 2018-10-29 DIAGNOSIS — M25511 Pain in right shoulder: Secondary | ICD-10-CM | POA: Diagnosis not present

## 2018-10-29 MED ORDER — CYCLOBENZAPRINE HCL 10 MG PO TABS: 10.0000 mg | ORAL_TABLET | Freq: Two times a day (BID) | ORAL | 0 refills | Status: DC | PRN

## 2018-10-29 MED FILL — CYCLOBENZAPRINE HCL 10 MG T: 10 | 10 days supply | Qty: 20 | Fill #0

## 2018-10-29 NOTE — Patient Instructions (Signed)
Heat (pad or rice pillow in microwave) over affected area, 10-15 minutes twice daily.   OK to take Tylenol 1000 mg (2 extra strength tabs) or 975 mg (3 regular strength tabs) every 6 hours as needed.  EXERCISES  RANGE OF MOTION (ROM) AND STRETCHING EXERCISES These exercises may help you when beginning to rehabilitate your injury. While completing these exercises, remember:   Restoring tissue flexibility helps normal motion to return to the joints. This allows healthier, less painful movement and activity.  An effective stretch should be held for at least 30 seconds.  A stretch should never be painful. You should only feel a gentle lengthening or release in the stretched tissue.  ROM - Pendulum  Bend at the waist so that your right / left arm falls away from your body. Support yourself with your opposite hand on a solid surface, such as a table or a countertop.  Your right / left arm should be perpendicular to the ground. If it is not perpendicular, you need to lean over farther. Relax the muscles in your right / left arm and shoulder as much as possible.  Gently sway your hips and trunk so they move your right / left arm without any use of your right / left shoulder muscles.  Progress your movements so that your right / left arm moves side to side, then forward and backward, and finally, both clockwise and counterclockwise.  Complete 10-15 repetitions in each direction. Many people use this exercise to relieve discomfort in their shoulder as well as to gain range of motion. Repeat 2 times. Complete this exercise 3 times per week.  STRETCH - Flexion, Standing  Stand with good posture. With an underhand grip on your right / left hand and an overhand grip on the opposite hand, grasp a broomstick or cane so that your hands are a little more than shoulder-width apart.  Keeping your right / left elbow straight and shoulder muscles relaxed, push the stick with your opposite hand to raise your  right / left arm in front of your body and then overhead. Raise your arm until you feel a stretch in your right / left shoulder, but before you have increased shoulder pain.  Try to avoid shrugging your right / left shoulder as your arm rises by keeping your shoulder blade tucked down and toward your mid-back spine. Hold 30 seconds.  Slowly return to the starting position. Repeat 2 times. Complete this exercise 3 times per week.  STRETCH - Internal Rotation  Place your right / left hand behind your back, palm-up.  Throw a towel or belt over your opposite shoulder. Grasp the towel/belt with your right / left hand.  While keeping an upright posture, gently pull up on the towel/belt until you feel a stretch in the front of your right / left shoulder.  Avoid shrugging your right / left shoulder as your arm rises by keeping your shoulder blade tucked down and toward your mid-back spine.  Hold 30. Release the stretch by lowering your opposite hand. Repeat 2 times. Complete this exercise 3 times per week.  STRETCH - External Rotation and Abduction  Stagger your stance through a doorframe. It does not matter which foot is forward.  As instructed by your physician, physical therapist or athletic trainer, place your hands: ? And forearms above your head and on the door frame. ? And forearms at head-height and on the door frame. ? At elbow-height and on the door frame.  Keeping your head  and chest upright and your stomach muscles tight to prevent over-extending your low-back, slowly shift your weight onto your front foot until you feel a stretch across your chest and/or in the front of your shoulders.  Hold 30 seconds. Shift your weight to your back foot to release the stretch. Repeat 2 times. Complete this stretch 3 times per week.   STRENGTHENING EXERCISES  These exercises may help you when beginning to rehabilitate your injury. They may resolve your symptoms with or without further  involvement from your physician, physical therapist or athletic trainer. While completing these exercises, remember:   Muscles can gain both the endurance and the strength needed for everyday activities through controlled exercises.  Complete these exercises as instructed by your physician, physical therapist or athletic trainer. Progress the resistance and repetitions only as guided.  You may experience muscle soreness or fatigue, but the pain or discomfort you are trying to eliminate should never worsen during these exercises. If this pain does worsen, stop and make certain you are following the directions exactly. If the pain is still present after adjustments, discontinue the exercise until you can discuss the trouble with your clinician.  If advised by your physician, during your recovery, avoid activity or exercises which involve actions that place your right / left hand or elbow above your head or behind your back or head. These positions stress the tissues which are trying to heal.  STRENGTH - Scapular Depression and Adduction  With good posture, sit on a firm chair. Supported your arms in front of you with pillows, arm rests or a table top. Have your elbows in line with the sides of your body.  Gently draw your shoulder blades down and toward your mid-back spine. Gradually increase the tension without tensing the muscles along the top of your shoulders and the back of your neck.  Hold for 3 seconds. Slowly release the tension and relax your muscles completely before completing the next repetition.  After you have practiced this exercise, remove the arm support and complete it in standing as well as sitting. Repeat 2 times. Complete this exercise 3 times per week.   STRENGTH - External Rotators  Secure a rubber exercise band/tubing to a fixed object so that it is at the same height as your right / left elbow when you are standing or sitting on a firm surface.  Stand or sit so that  the secured exercise band/tubing is at your side that is not injured.  Bend your elbow 90 degrees. Place a folded towel or small pillow under your right / left arm so that your elbow is a few inches away from your side.  Keeping the tension on the exercise band/tubing, pull it away from your body, as if pivoting on your elbow. Be sure to keep your body steady so that the movement is only coming from your shoulder rotating.  Hold 3 seconds. Release the tension in a controlled manner as you return to the starting position. Repeat 2 times. Complete this exercise 3 times per week.   STRENGTH - Supraspinatus  Stand or sit with good posture. Grasp a 2-3 lb weight or an exercise band/tubing so that your hand is "thumbs-up," like when you shake hands.  Slowly lift your right / left hand from your thigh into the air, traveling about 30 degrees from straight out at your side. Lift your hand to shoulder height or as far as you can without increasing any shoulder pain. Initially, many people do  not lift their hands above shoulder height.  Avoid shrugging your right / left shoulder as your arm rises by keeping your shoulder blade tucked down and toward your mid-back spine.  Hold for 3 seconds. Control the descent of your hand as you slowly return to your starting position. Repeat 2 times. Complete this exercise 3 times per week.   STRENGTH - Shoulder Extensors  Secure a rubber exercise band/tubing so that it is at the height of your shoulders when you are either standing or sitting on a firm arm-less chair.  With a thumbs-up grip, grasp an end of the band/tubing in each hand. Straighten your elbows and lift your hands straight in front of you at shoulder height. Step back away from the secured end of band/tubing until it becomes tense.  Squeezing your shoulder blades together, pull your hands down to the sides of your thighs. Do not allow your hands to go behind you.  Hold for 3 seconds. Slowly ease  the tension on the band/tubing as you reverse the directions and return to the starting position. Repeat 2 times. Complete this exercise 3 times per week.   STRENGTH - Scapular Retractors  Secure a rubber exercise band/tubing so that it is at the height of your shoulders when you are either standing or sitting on a firm arm-less chair.  With a palm-down grip, grasp an end of the band/tubing in each hand. Straighten your elbows and lift your hands straight in front of you at shoulder height. Step back away from the secured end of band/tubing until it becomes tense.  Squeezing your shoulder blades together, draw your elbows back as you bend them. Keep your upper arm lifted away from your body throughout the exercise.  Hold 3 seconds. Slowly ease the tension on the band/tubing as you reverse the directions and return to the starting position. Repeat 2 times. Complete this exercise 3 times per week.  STRENGTH - Scapular Depressors  Find a sturdy chair without wheels, such as a from a dining room table.  Keeping your feet on the floor, lift your bottom from the seat and lock your elbows.  Keeping your elbows straight, allow gravity to pull your body weight down. Your shoulders will rise toward your ears.  Raise your body against gravity by drawing your shoulder blades down your back, shortening the distance between your shoulders and ears. Although your feet should always maintain contact with the floor, your feet should progressively support less body weight as you get stronger.  Hold 3 seconds. In a controlled and slow manner, lower your body weight to begin the next repetition. Repeat 2 times. Complete this exercise 3 times per week.   This information is not intended to replace advice given to you by your health care provider. Make sure you discuss any questions you have with your health care provider.  Document Released: 10/23/2005 Document Revised: 12/30/2014 Document Reviewed:  03/23/2009 Elsevier Interactive Patient Education [employer].

## 2018-10-29 NOTE — Progress Notes (Signed)
Musculoskeletal Exam  Patient: Zachary [name] DOB: 16-Sep-1951  DOS: 10/29/2018  SUBJECTIVE:  Chief Complaint:   Chief Complaint  Patient presents with  . Shoulder Pain    right    Zachary [name] is a 67 y.o.  male for evaluation and treatment of R shoulder pain.   Onset:  2 weeks ago.  No inj or change inactivity.  Location: L shoulder posterior Character:  spasms  Progression of issue:  is unchanged Associated symptoms: Intermittent Treatment: to date has been muscle relaxers, massage and topical meds.   Neurovascular symptoms: no  ROS: Musculoskeletal/Extremities: +R shoulder pain  Past Medical History:  Diagnosis Date  . Arthritis   . Articular cartilage disease    left shoulder  . Cancer ([facility]) 2015   positive prostate cancer bx  . Diverticulitis   . Dyslipidemia (high LDL; low HDL) 08/15/2016  . GERD (gastroesophageal reflux disease)   . Hearing problem    hearing deficit  . Hemorrhoids   . Hypertension   . Hypothyroidism   . Sleep apnea    uses a cpap  . Wears glasses   . Wears hearing aid    both ears    Objective: VITAL SIGNS: BP 108/76 (BP Location: Left Arm, Patient Position: Sitting, Cuff Size: Normal)   Pulse 61   Temp 98.2 F (36.8 C) (Oral)   Ht 5\' 8"  (1.727 m)   Wt 200 lb (90.7 kg)   SpO2 96%   BMI 30.41 kg/m  Constitutional: Well formed, well developed. No acute distress. Cardiovascular: Brisk cap refill Thorax & Lungs: No accessory muscle use Musculoskeletal: R shoulder.   Normal active range of motion: yes.   Normal passive range of motion: yes Tenderness to palpation: Mild tenderness over external rotators Deformity: no Ecchymosis: no Tests positive: none Tests negative: Liftoff, Neer's, empty can, speeds, crossover, Hawkins 5/5 strength throughout in upper extremities with the exception of 4/5 strength with external rotation on the right Neurologic: Normal sensory function. No focal deficits noted. DTR's equal and  symmetry in UE's. No clonus. Psychiatric: Normal mood. Age appropriate judgment and insight. Alert & oriented x 3.    Assessment:  Acute pain of right shoulder - Plan: cyclobenzaprine (FLEXERIL) 10 MG tablet  Plan: I believe he has irritated in his external rotators.  Stretches and exercises given, heat, ice, Tylenol, above medicine as needed. F/u as needed. The patient voiced understanding and agreement to the plan.   [place], DO 10/29/18  11:58 AM

## 2018-10-29 NOTE — Progress Notes (Signed)
Pre visit review using our clinic review tool, if applicable. No additional management support is needed unless otherwise documented below in the visit note. 

## 2018-11-04 MED FILL — OMEPRAZOLE 40 MG CPDR: 40 | 30 days supply | Qty: 30 | Fill #2

## 2018-11-05 ENCOUNTER — Encounter: Payer: Self-pay | Admitting: Adult Health

## 2018-11-05 ENCOUNTER — Ambulatory Visit (INDEPENDENT_AMBULATORY_CARE_PROVIDER_SITE_OTHER): Payer: 59 | Admitting: Adult Health

## 2018-11-05 VITALS — BP 114/72 | HR 62 | Ht 68.0 in | Wt 197.0 lb

## 2018-11-05 DIAGNOSIS — G4733 Obstructive sleep apnea (adult) (pediatric): Secondary | ICD-10-CM

## 2018-11-05 NOTE — Progress Notes (Signed)
Reviewed and agree with assessment/plan.   Liliane Mallis, MD [facility] 12/18/2016, 12:24 PM Pager:  [phone]  

## 2018-11-05 NOTE — Patient Instructions (Signed)
Continue on CPAP At bedtime   Order for supplies renewal.  Keep up good work.  Stay active.  Do not drive if sleepy .  Follow up Dr. Halford Chessman or [place]  In 1 year and As needed

## 2018-11-05 NOTE — Assessment & Plan Note (Signed)
Controlled on CPAP   Plan  Patient Instructions  Continue on CPAP At bedtime   Order for supplies renewal.  Keep up good work.  Stay active.  Do not drive if sleepy .  Follow up Dr. Halford Chessman or [place]  In 1 year and As needed

## 2018-11-05 NOTE — Progress Notes (Signed)
@Patient  ID: Zachary [name], male    DOB: 02-16-1951, 67 y.o.   MRN: [id number]  Chief Complaint  Patient presents with  . Follow-up    OSA     Referring provider: [name]*  HPI: 67 year old male followed for sleep apnea on nocturnal CPAP Patient is a Clinical biochemist at [employer].  TEST/EVENTS :  Cath neg 2010  PSG showed mild obstructive sleep apnea , predmominat RERAs s/o UARS with RDI 11/h.He was desensitized with medium nasal mask. Some bruxism was noted  9/12 >changed to 11 cm based on download    11/05/2018 follow-up sleep apnea Patient returns for a one-year follow-up for sleep apnea.  Patient is doing very well on CPAP.  He says he feels rested with no significant daytime sleepiness.  Patient has excellent control of his events.  Download shows excellent compliance with average usage at 5.5 hours.  Patient is on CPAP 11 summers H2O.  AHI 0.8.  Minimum leaks Retiring next year   Allergies  Allergen Reactions  . Oxycodone Itching  . Simvastatin Other (See Comments)    REACTION: muscle pain    Immunization History  Administered Date(s) Administered  . Influenza Whole 11/06/2010, 10/24/2011, 09/22/2012  . Influenza-Unspecified 09/22/2014, 09/23/2015, 09/17/2016, 09/09/2017  . Td 10/17/2008  . Tdap 01/07/2013  . Zoster 08/08/2015    Past Medical History:  Diagnosis Date  . Arthritis   . Articular cartilage disease    left shoulder  . Cancer ([name]) 2015   positive prostate cancer bx  . Diverticulitis   . Dyslipidemia (high LDL; low HDL) 08/15/2016  . GERD (gastroesophageal reflux disease)   . Hearing problem    hearing deficit  . Hemorrhoids   . Hypertension   . Hypothyroidism   . Sleep apnea    uses a cpap  . Wears glasses   . Wears hearing aid    both ears    Tobacco History: Social History   Tobacco Use  Smoking Status Never Smoker  Smokeless Tobacco Never Used   Counseling given: Not  Answered   Outpatient Medications Prior to Visit  Medication Sig Dispense Refill  . aspirin EC 81 MG EC tablet Take 81 mg by mouth daily.      . Cyanocobalamin (VITAMIN B 12 PO) Take 500 mg by mouth daily.    . cyclobenzaprine (FLEXERIL) 10 MG tablet Take 1 tablet (10 mg total) by mouth 2 (two) times daily as needed. 20 tablet 0  . hydrocortisone (ANUSOL-HC) 2.5 % rectal cream Place 1 application rectally at bedtime as needed for hemorrhoids or itching. 30 g 6  . levothyroxine (SYNTHROID, LEVOTHROID) 100 MCG tablet TAKE 1 TABLET BY MOUTH DAILY 90 tablet 1  . lisinopril (PRINIVIL,ZESTRIL) 5 MG tablet TAKE 1 TABLET BY MOUTH DAILY 90 tablet 1  . loratadine (CLARITIN) 10 MG tablet Take 10 mg by mouth daily.      . Melatonin 5 MG CAPS Take 2 capsules by mouth daily.    Marland Kitchen omeprazole (PRILOSEC) 40 MG capsule TAKE ONE CAPSULE BY MOUTH DAILY 30 capsule 5  . pravastatin (PRAVACHOL) 40 MG tablet TAKE 1/2 TABLET (20 MG TOTAL) BY MOUTH DAILY. 45 tablet 2  . sodium bicarbonate 650 MG tablet TAKE 1 TABLET (650 MG TOTAL) BY MOUTH 4 TIMES DAILY. 360 tablet 1  . fexofenadine (ALLEGRA) 180 MG tablet Take 1 tablet (180 mg total) by mouth daily. 20 tablet 0   No facility-administered medications prior to visit.  Review of Systems  Constitutional:   No  weight loss, night sweats,  Fevers, chills, fatigue, or  lassitude.  HEENT:   No headaches,  Difficulty swallowing,  Tooth/dental problems, or  Sore throat,                No sneezing, itching, ear ache, nasal congestion, post nasal drip,   CV:  No chest pain,  Orthopnea, PND, swelling in lower extremities, anasarca, dizziness, palpitations, syncope.   GI  No heartburn, indigestion, abdominal pain, nausea, vomiting, diarrhea, change in bowel habits, loss of appetite, bloody stools.   Resp: No shortness of breath with exertion or at rest.  No excess mucus, no productive cough,  No non-productive cough,  No coughing up of blood.  No change in color of  mucus.  No wheezing.  No chest wall deformity  Skin: no rash or lesions.  GU: no dysuria, change in color of urine, no urgency or frequency.  No flank pain, no hematuria   MS:  No joint pain or swelling.  No decreased range of motion.  No back pain.    Physical Exam  BP 114/72 (BP Location: Left Arm, Cuff Size: Normal)   Pulse 62   Ht 5\' 8"  (1.727 m)   Wt 197 lb (89.4 kg)   SpO2 96%   BMI 29.95 kg/m   GEN: A/Ox3; pleasant , NAD, well nourished    HEENT:  [place]/AT,  EACs-clear, TMs-wnl, NOSE-clear, THROAT-clear, no lesions, no postnasal drip or exudate noted. Class II MP airway   NECK:  Supple w/ fair ROM; no JVD; normal carotid impulses w/o bruits; no thyromegaly or nodules palpated; no lymphadenopathy.    RESP  Clear  P & A; w/o, wheezes/ rales/ or rhonchi. no accessory muscle use, no dullness to percussion  CARD:  RRR, no m/r/g, no peripheral edema, pulses intact, no cyanosis or clubbing.  GI:   Soft & nt; nml bowel sounds; no organomegaly or masses detected.   Musco: Warm bil, no deformities or joint swelling noted.   Neuro: alert, no focal deficits noted.    Skin: Warm, no lesions or rashes    Lab Results:  CBC  BNP No results found for: BNP  ProBNP No results found for: PROBNP  Imaging: No results found.    No flowsheet data found.  No results found for: NITRICOXIDE      Assessment & Plan:   OSA (obstructive sleep apnea) Controlled on CPAP   Plan  Patient Instructions  Continue on CPAP At bedtime   Order for supplies renewal.  Keep up good work.  Stay active.  Do not drive if sleepy .  Follow up Dr. Halford Chessman or Kellie Murrill  In 1 year and As needed           Rexene Edison, NP 11/05/2018

## 2018-11-16 DIAGNOSIS — M722 Plantar fascial fibromatosis: Secondary | ICD-10-CM | POA: Diagnosis not present

## 2018-11-16 DIAGNOSIS — M6702 Short Achilles tendon (acquired), left ankle: Secondary | ICD-10-CM | POA: Diagnosis not present

## 2018-11-24 DIAGNOSIS — G4733 Obstructive sleep apnea (adult) (pediatric): Secondary | ICD-10-CM | POA: Diagnosis not present

## 2018-11-25 DIAGNOSIS — C61 Malignant neoplasm of prostate: Secondary | ICD-10-CM | POA: Diagnosis not present

## 2018-11-25 DIAGNOSIS — Z192 Hormone resistant malignancy status: Secondary | ICD-10-CM | POA: Diagnosis not present

## 2018-11-26 ENCOUNTER — Ambulatory Visit: Payer: 59 | Admitting: Family Medicine

## 2018-11-27 MED FILL — MELOXICAM 15 MG TABLET: 15 | 40 days supply | Qty: 40 | Fill #1

## 2018-11-30 ENCOUNTER — Encounter: Payer: Self-pay | Admitting: Family Medicine

## 2018-11-30 ENCOUNTER — Ambulatory Visit (INDEPENDENT_AMBULATORY_CARE_PROVIDER_SITE_OTHER): Payer: 59 | Admitting: Family Medicine

## 2018-11-30 VITALS — BP 118/78 | HR 78 | Temp 98.0°F | Ht 68.0 in | Wt 201.5 lb

## 2018-11-30 DIAGNOSIS — K5792 Diverticulitis of intestine, part unspecified, without perforation or abscess without bleeding: Secondary | ICD-10-CM

## 2018-11-30 MED ORDER — METRONIDAZOLE 500 MG PO TABS: 500.0000 mg | ORAL_TABLET | Freq: Three times a day (TID) | ORAL | 0 refills | Status: DC

## 2018-11-30 MED ORDER — CIPROFLOXACIN HCL 500 MG PO TABS: 500.0000 mg | ORAL_TABLET | Freq: Two times a day (BID) | ORAL | 0 refills | Status: DC

## 2018-11-30 MED FILL — metroNIDAZOLE 500 MG TABS: 500 | 10 days supply | Qty: 30 | Fill #0

## 2018-11-30 MED FILL — CIPROFLOXACIN HCL 500 MG TA: 500 | 10 days supply | Qty: 20 | Fill #0

## 2018-11-30 NOTE — Patient Instructions (Addendum)
Do NOT drink alcohol while on the metronidazole!  Diverticulitis Diverticulitis is infection or inflammation of small pouches (diverticula) in the colon that form due to a condition called diverticulosis. Diverticula can trap stool (feces) and bacteria, causing infection and inflammation. Diverticulitis may cause severe stomach pain and diarrhea. It may lead to tissue damage in the colon that causes bleeding. The diverticula may also burst (rupture) and cause infected stool to enter other areas of the abdomen. Complications of diverticulitis can include:  Bleeding.  Severe infection.  Severe pain.  Rupture (perforation) of the colon.  Blockage (obstruction) of the colon.  What are the causes? This condition is caused by stool becoming trapped in the diverticula, which allows bacteria to grow in the diverticula. This leads to inflammation and infection. What increases the risk? You are more likely to develop this condition if:  You have diverticulosis. The risk for diverticulosis increases if: ? You are overweight or obese. ? You use tobacco products. ? You do not get enough exercise.  You eat a diet that does not include enough fiber. High-fiber foods include fruits, vegetables, beans, nuts, and whole grains.  What are the signs or symptoms? Symptoms of this condition may include:  Pain and tenderness in the abdomen. The pain is normally located on the left side of the abdomen, but it may occur in other areas.  Fever and chills.  Bloating.  Cramping.  Nausea.  Vomiting.  Changes in bowel routines.  Blood in your stool.  How is this diagnosed? This condition is diagnosed based on:  Your medical history.  A physical exam.  Tests to make sure there is nothing else causing your condition. These tests may include: ? Blood tests. ? Urine tests. ? Imaging tests of the abdomen, including X-rays, ultrasounds, MRIs, or CT scans.  How is this treated? Most cases of  this condition are mild and can be treated at home. Treatment may include:  Taking over-the-counter pain medicines.  Following a clear liquid diet.  Taking antibiotic medicines by mouth.  Rest.  More severe cases may need to be treated at a hospital. Treatment may include:  Not eating or drinking.  Taking prescription pain medicine.  Receiving antibiotic medicines through an IV tube.  Receiving fluids and nutrition through an IV tube.  Surgery.  When your condition is under control, your health care provider may recommend that you have a colonoscopy. This is an exam to look at the entire large intestine. During the exam, a lubricated, bendable tube is inserted into the anus and then passed into the rectum, colon, and other parts of the large intestine. A colonoscopy can show how severe your diverticula are and whether something else may be causing your symptoms. Follow these instructions at home: Medicines  Take over-the-counter and prescription medicines only as told by your health care provider. These include fiber supplements, probiotics, and stool softeners.  If you were prescribed an antibiotic medicine, take it as told by your health care provider. Do not stop taking the antibiotic even if you start to feel better.  Do not drive or use heavy machinery while taking prescription pain medicine. General instructions  Follow a full liquid diet or another diet as directed by your health care provider. After your symptoms improve, your health care provider may tell you to change your diet. He or she may recommend that you eat a diet that contains at least 25 g (25 grams) of fiber daily. Fiber makes it easier to pass stool.  Healthy sources of fiber include: ? Berries. One cup contains 4-8 grams of fiber. ? Beans or lentils. One half cup contains 5-8 grams of fiber. ? Green vegetables. One cup contains 4 grams of fiber.  Exercise for at least 30 minutes, 3 times each week. You  should exercise hard enough to raise your heart rate and break a sweat.  Keep all follow-up visits as told by your health care provider. This is important. You may need a colonoscopy. Contact a health care provider if:  Your pain does not improve.  You have a hard time drinking or eating food.  Your bowel movements do not return to normal. Get help right away if:  Your pain gets worse.  Your symptoms do not get better with treatment.  Your symptoms suddenly get worse.  You have a fever.  You vomit more than one time.  You have stools that are bloody, black, or tarry. Summary  Diverticulitis is infection or inflammation of small pouches (diverticula) in the colon that form due to a condition called diverticulosis. Diverticula can trap stool (feces) and bacteria, causing infection and inflammation.  You are at higher risk for this condition if you have diverticulosis and you eat a diet that does not include enough fiber.  Most cases of this condition are mild and can be treated at home. More severe cases may need to be treated at a hospital.  When your condition is under control, your health care provider may recommend that you have an exam called a colonoscopy. This exam can show how severe your diverticula are and whether something else may be causing your symptoms. This information is not intended to replace advice given to you by your health care provider. Make sure you discuss any questions you have with your health care provider. Document Released: 09/18/2005 Document Revised: 01/11/2017 Document Reviewed: 01/11/2017 Elsevier Interactive Patient Education  [employer].

## 2018-11-30 NOTE — Progress Notes (Signed)
Chief Complaint  Patient presents with  . Abdominal Pain    Subjective: Patient is a 67 y.o. male here for abd pain.  Hx of diverticulitis many years ago. 1 d ago, he started having lower abd pain, described as sharp. Worse with movement. Denies fevers, bleeding, bowel changes, increase in PO intake of seeds/nuts. Feels similar to previous episode of diverticulitis.   ROS: Abd: As noted in HPI  Past Medical History:  Diagnosis Date  . Arthritis   . Articular cartilage disease    left shoulder  . Cancer ([place]) 2015   positive prostate cancer bx  . Diverticulitis   . Dyslipidemia (high LDL; low HDL) 08/15/2016  . GERD (gastroesophageal reflux disease)   . Hearing problem    hearing deficit  . Hemorrhoids   . Hypertension   . Hypothyroidism   . Sleep apnea    uses a cpap  . Wears glasses   . Wears hearing aid    both ears    Objective: BP 118/78 (BP Location: Left Arm, Patient Position: Sitting, Cuff Size: Normal)   Pulse 78   Temp 98 F (36.7 C) (Oral)   Ht 5\' 8"  (1.727 m)   Wt 201 lb 8 oz (91.4 kg)   SpO2 98%   BMI 30.64 kg/m  General: Awake, appears stated age HEENT: MMM, EOMi Heart: RRR, no murmurs Lungs: CTAB, no rales, wheezes or rhonchi. No accessory muscle use Abd: BS+, soft, TTP in LLQ, mod distension, no masses or organomegaly Psych: Age appropriate judgment and insight, normal affect and mood  Assessment and Plan: Acute diverticulitis - Plan: ciprofloxacin (CIPRO) 500 MG tablet, metroNIDAZOLE (FLAGYL) 500 MG tablet  Orders as above. No alcohol w Flagyl.  F/u as originally scheduled.  The patient voiced understanding and agreement to the plan.  [place], DO 11/30/18  9:43 AM

## 2018-11-30 NOTE — Progress Notes (Signed)
Pre visit review using our clinic review tool, if applicable. No additional management support is needed unless otherwise documented below in the visit note. 

## 2018-12-01 DIAGNOSIS — H903 Sensorineural hearing loss, bilateral: Secondary | ICD-10-CM | POA: Diagnosis not present

## 2018-12-02 ENCOUNTER — Ambulatory Visit: Payer: 59 | Admitting: Family Medicine

## 2018-12-02 DIAGNOSIS — C61 Malignant neoplasm of prostate: Secondary | ICD-10-CM | POA: Diagnosis not present

## 2018-12-02 DIAGNOSIS — Z192 Hormone resistant malignancy status: Secondary | ICD-10-CM | POA: Diagnosis not present

## 2018-12-03 ENCOUNTER — Ambulatory Visit: Payer: 59 | Admitting: Adult Health

## 2018-12-09 MED FILL — PRAVASTATIN NA 40 MG TAB: 40 | 90 days supply | Qty: 45 | Fill #1

## 2018-12-09 MED FILL — OMEPRAZOLE 40 MG CPDR: 40 | 30 days supply | Qty: 30 | Fill #3

## 2018-12-17 DIAGNOSIS — H903 Sensorineural hearing loss, bilateral: Secondary | ICD-10-CM | POA: Diagnosis not present

## 2019-01-04 MED FILL — LEVOTHYROXINE 100 MCG TAB: 100 | 90 days supply | Qty: 90 | Fill #1

## 2019-01-06 MED FILL — AMOXICILLIN 875 MG TABS: 875 | 10 days supply | Qty: 20 | Fill #0

## 2019-01-14 ENCOUNTER — Encounter: Payer: Self-pay | Admitting: Family Medicine

## 2019-01-14 ENCOUNTER — Ambulatory Visit: Payer: 59 | Admitting: Family Medicine

## 2019-01-14 VITALS — BP 108/62 | HR 71 | Temp 97.5°F | Ht 68.0 in | Wt 195.4 lb

## 2019-01-14 DIAGNOSIS — K219 Gastro-esophageal reflux disease without esophagitis: Secondary | ICD-10-CM | POA: Diagnosis not present

## 2019-01-14 DIAGNOSIS — R9389 Abnormal findings on diagnostic imaging of other specified body structures: Secondary | ICD-10-CM

## 2019-01-14 DIAGNOSIS — R413 Other amnesia: Secondary | ICD-10-CM

## 2019-01-14 DIAGNOSIS — Z23 Encounter for immunization: Secondary | ICD-10-CM | POA: Diagnosis not present

## 2019-01-14 LAB — VITAMIN B12: VITAMIN B 12: 460 pg/mL (ref 211–911)

## 2019-01-14 MED ORDER — OMEPRAZOLE 20 MG PO CPDR: 20.0000 mg | DELAYED_RELEASE_CAPSULE | Freq: Every day | ORAL | 0 refills | Status: DC

## 2019-01-14 MED FILL — OMEPRAZOLE 20 MG CPDR: 20 | 30 days supply | Qty: 30 | Fill #0

## 2019-01-14 MED FILL — LISINOPRIL 5 MG TABLET: 5 | 90 days supply | Qty: 90 | Fill #1

## 2019-01-14 NOTE — Progress Notes (Signed)
Pre visit review using our clinic review tool, if applicable. No additional management support is needed unless otherwise documented below in the visit note. 

## 2019-01-14 NOTE — Progress Notes (Signed)
Chief Complaint  Patient presents with  . Follow-up    dental xray  . Gastroesophageal Reflux    discuss medication    GERD Zachary [name] is a 68 y.o. male who presents for evaluation of GERD. Has been on PPI for many years. Changing diet, interested in coming off of it. In past, has had issues with s/s's returning.  Hx of memory issues. Unsure if age. Would like to have B12 checked.   Had dental XR done. Showed possible fungal infection in paranasal sinuses. Rec'd ENT f/u. They would like to wait until March to pursue this.   Past Medical History:  Diagnosis Date  . Arthritis   . Articular cartilage disease    left shoulder  . Cancer ([place] [facility]) 2015   positive prostate cancer bx  . Diverticulitis   . Dyslipidemia (high LDL; low HDL) 08/15/2016  . GERD (gastroesophageal reflux disease)   . Hearing problem    hearing deficit  . Hemorrhoids   . Hypertension   . Hypothyroidism   . Sleep apnea    uses a cpap  . Wears glasses   . Wears hearing aid    both ears    Review of Systems Gastrointestinal: as noted in HPI  Exam BP 108/62 (BP Location: Left Arm, Patient Position: Sitting, Cuff Size: Normal)   Pulse 71   Temp (!) 97.5 F (36.4 C) (Oral)   Ht 5\' 8"  (1.727 m)   Wt 195 lb 6 oz (88.6 kg)   SpO2 94%   BMI 29.71 kg/m  General:  well developed, well nourished, in no apparent distress Lungs:  clear to auscultation, breath sounds equal bilaterally, no respiratory distress, no wheezes Cardio:  regular rate and rhythm without murmurs, heart sounds without clicks or rubs Abdomen:  abdomen soft, nontender; bowel sounds normal; no masses or organomegaly Psych: Well oriented with normal range of affect appropriate judgment/insight  Assessment and Plan  Memory difficulty - Plan: B12  Gastroesophageal reflux disease without esophagitis - Plan: omeprazole (PRILOSEC) 20 MG capsule  Abnormal x-ray  Ck B12. Decrease dose of PPI from 40 mg/d to 20 mg/d. H2 blocker rec'd  after that.  Counseled on GERD diet and precautions Will refer to ENT at their request in March, they are to message me.  Follow up pending improvement. Pt voiced understanding and agreement to the plan.  [place], DO 01/14/19  9:48 AM

## 2019-01-14 NOTE — Patient Instructions (Addendum)
Give [place] 2-3 business days to get the results of your labs back.   Send me a MyChart after 3/1 if you want to see an ENT provider.   Ok to try Pepcid (famotidine) after finishing up the omeprazole.  Let us know if you need anything.

## 2019-01-22 ENCOUNTER — Other Ambulatory Visit: Payer: Self-pay | Admitting: Family Medicine

## 2019-01-22 DIAGNOSIS — M25511 Pain in right shoulder: Secondary | ICD-10-CM

## 2019-01-22 MED FILL — CYCLOBENZAPRINE HCL 10 MG T: 10 | 10 days supply | Qty: 20 | Fill #0

## 2019-03-01 ENCOUNTER — Ambulatory Visit (INDEPENDENT_AMBULATORY_CARE_PROVIDER_SITE_OTHER): Payer: Medicare Other | Admitting: Family Medicine

## 2019-03-01 ENCOUNTER — Encounter: Payer: Self-pay | Admitting: Family Medicine

## 2019-03-01 VITALS — BP 108/68 | HR 78 | Temp 97.7°F | Ht 68.0 in | Wt 195.1 lb

## 2019-03-01 DIAGNOSIS — H6121 Impacted cerumen, right ear: Secondary | ICD-10-CM

## 2019-03-01 NOTE — Patient Instructions (Signed)
OK to use Debrox (peroxide) in the ear to loosen up wax. Also recommend using a bulb syringe (for removing boogers from baby's noses) to flush through warm water. Do not use Q-tips as this can impact wax further.  Let us know if you need anything. 

## 2019-03-01 NOTE — Progress Notes (Signed)
Verbal consent obtained the ear was flushed with warm water and Dulcolax The cerumen was successfully removed The pt reported immediate improvement there were no immediate complications noted

## 2019-03-01 NOTE — Progress Notes (Signed)
Chief Complaint  Patient presents with  . Follow-up    Subjective: Patient is a 68 y.o. male here for decreased hearing in R ear.  3 d ago started. Tried OTC wax management that did help a little. Does have a hearing aid and it did not help. L ear is OK. +hx of wax. Denies pain, drainage, fevers.    ROS: HEENT: +hearing loss  Past Medical History:  Diagnosis Date  . Arthritis   . Articular cartilage disease    left shoulder  . Cancer ([place]) 2015   positive prostate cancer bx  . Diverticulitis   . Dyslipidemia (high LDL; low HDL) 08/15/2016  . GERD (gastroesophageal reflux disease)   . Hearing problem    hearing deficit  . Hemorrhoids   . Hypertension   . Hypothyroidism   . Sleep apnea    uses a cpap  . Wears glasses   . Wears hearing aid    both ears    Objective: BP 108/68 (BP Location: Left Arm, Patient Position: Sitting, Cuff Size: Normal)   Pulse 78   Temp 97.7 F (36.5 C) (Oral)   Ht 5\' 8"  (1.727 m)   Wt 195 lb 2 oz (88.5 kg)   SpO2 97%   BMI 29.67 kg/m  General: Awake, appears stated age HEENT: MMM, EOMi; L ear neg, R ear 100% obstructed with cerumen Lungs: No accessory muscle use Psych: Age appropriate judgment and insight, normal affect and mood   Assessment and Plan: Impacted cerumen of right ear  Home instructions given on how to manage.  Cerumen successfully removed. F/u prn. The patient voiced understanding and agreement to the plan.  [place], DO 03/01/19  8:17 AM

## 2019-03-05 ENCOUNTER — Other Ambulatory Visit: Payer: Self-pay | Admitting: Family Medicine

## 2019-03-05 DIAGNOSIS — K219 Gastro-esophageal reflux disease without esophagitis: Secondary | ICD-10-CM

## 2019-03-05 MED FILL — OMEPRAZOLE 40 MG CPDR: 40 | 30 days supply | Qty: 30 | Fill #4 | Status: TO

## 2019-03-12 MED FILL — PRAVASTATIN NA 40 MG TAB: 40 | 90 days supply | Qty: 45 | Fill #2 | Status: TO

## 2019-03-31 DIAGNOSIS — D494 Neoplasm of unspecified behavior of bladder: Secondary | ICD-10-CM | POA: Diagnosis not present

## 2019-03-31 DIAGNOSIS — R310 Gross hematuria: Secondary | ICD-10-CM | POA: Diagnosis not present

## 2019-03-31 DIAGNOSIS — Z01818 Encounter for other preprocedural examination: Secondary | ICD-10-CM | POA: Diagnosis not present

## 2019-03-31 DIAGNOSIS — C61 Malignant neoplasm of prostate: Secondary | ICD-10-CM | POA: Diagnosis not present

## 2019-03-31 DIAGNOSIS — R300 Dysuria: Secondary | ICD-10-CM | POA: Diagnosis not present

## 2019-04-02 DIAGNOSIS — N3289 Other specified disorders of bladder: Secondary | ICD-10-CM | POA: Diagnosis not present

## 2019-04-02 DIAGNOSIS — Z01810 Encounter for preprocedural cardiovascular examination: Secondary | ICD-10-CM | POA: Diagnosis not present

## 2019-04-02 DIAGNOSIS — Z01818 Encounter for other preprocedural examination: Secondary | ICD-10-CM | POA: Diagnosis not present

## 2019-04-06 DIAGNOSIS — R300 Dysuria: Secondary | ICD-10-CM | POA: Diagnosis not present

## 2019-04-06 DIAGNOSIS — N3080 Other cystitis without hematuria: Secondary | ICD-10-CM | POA: Diagnosis not present

## 2019-04-06 DIAGNOSIS — G4733 Obstructive sleep apnea (adult) (pediatric): Secondary | ICD-10-CM | POA: Diagnosis not present

## 2019-04-06 DIAGNOSIS — Z923 Personal history of irradiation: Secondary | ICD-10-CM | POA: Diagnosis not present

## 2019-04-06 DIAGNOSIS — R310 Gross hematuria: Secondary | ICD-10-CM | POA: Diagnosis not present

## 2019-04-06 DIAGNOSIS — Z9989 Dependence on other enabling machines and devices: Secondary | ICD-10-CM | POA: Diagnosis not present

## 2019-04-06 DIAGNOSIS — N529 Male erectile dysfunction, unspecified: Secondary | ICD-10-CM | POA: Diagnosis not present

## 2019-04-06 DIAGNOSIS — Z8546 Personal history of malignant neoplasm of prostate: Secondary | ICD-10-CM | POA: Diagnosis not present

## 2019-04-06 DIAGNOSIS — D494 Neoplasm of unspecified behavior of bladder: Secondary | ICD-10-CM | POA: Diagnosis not present

## 2019-04-06 DIAGNOSIS — D414 Neoplasm of uncertain behavior of bladder: Secondary | ICD-10-CM | POA: Diagnosis not present

## 2019-04-09 DIAGNOSIS — N3040 Irradiation cystitis without hematuria: Secondary | ICD-10-CM | POA: Insufficient documentation

## 2019-04-09 DIAGNOSIS — C61 Malignant neoplasm of prostate: Secondary | ICD-10-CM | POA: Diagnosis not present

## 2019-04-22 ENCOUNTER — Encounter: Payer: Self-pay | Admitting: Family Medicine

## 2019-04-22 DIAGNOSIS — K219 Gastro-esophageal reflux disease without esophagitis: Secondary | ICD-10-CM

## 2019-04-23 MED ORDER — OMEPRAZOLE 20 MG PO CPDR: 20.0000 mg | DELAYED_RELEASE_CAPSULE | Freq: Every day | ORAL | 1 refills | Status: DC

## 2019-06-17 ENCOUNTER — Encounter: Payer: Self-pay | Admitting: Family Medicine

## 2019-06-18 ENCOUNTER — Other Ambulatory Visit: Payer: Self-pay | Admitting: Family Medicine

## 2019-06-18 MED ORDER — PRAVASTATIN SODIUM 20 MG PO TABS: 20.0000 mg | ORAL_TABLET | Freq: Every day | ORAL | 3 refills | Status: DC

## 2019-07-08 ENCOUNTER — Other Ambulatory Visit: Payer: Self-pay | Admitting: Family Medicine

## 2019-07-08 DIAGNOSIS — I10 Essential (primary) hypertension: Secondary | ICD-10-CM

## 2019-07-14 DIAGNOSIS — C61 Malignant neoplasm of prostate: Secondary | ICD-10-CM | POA: Diagnosis not present

## 2019-07-14 DIAGNOSIS — N3040 Irradiation cystitis without hematuria: Secondary | ICD-10-CM | POA: Diagnosis not present

## 2019-07-23 DIAGNOSIS — Z8546 Personal history of malignant neoplasm of prostate: Secondary | ICD-10-CM | POA: Diagnosis not present

## 2019-07-23 DIAGNOSIS — T66XXXD Radiation sickness, unspecified, subsequent encounter: Secondary | ICD-10-CM | POA: Diagnosis not present

## 2019-07-23 DIAGNOSIS — N3040 Irradiation cystitis without hematuria: Secondary | ICD-10-CM | POA: Diagnosis not present

## 2019-07-23 DIAGNOSIS — T66XXXS Radiation sickness, unspecified, sequela: Secondary | ICD-10-CM | POA: Diagnosis not present

## 2019-07-29 DIAGNOSIS — T66XXXS Radiation sickness, unspecified, sequela: Secondary | ICD-10-CM | POA: Diagnosis not present

## 2019-07-29 DIAGNOSIS — N3041 Irradiation cystitis with hematuria: Secondary | ICD-10-CM | POA: Diagnosis not present

## 2019-07-30 DIAGNOSIS — N3041 Irradiation cystitis with hematuria: Secondary | ICD-10-CM | POA: Diagnosis not present

## 2019-07-30 DIAGNOSIS — T66XXXS Radiation sickness, unspecified, sequela: Secondary | ICD-10-CM | POA: Diagnosis not present

## 2019-08-02 DIAGNOSIS — T66XXXS Radiation sickness, unspecified, sequela: Secondary | ICD-10-CM | POA: Diagnosis not present

## 2019-08-02 DIAGNOSIS — N3041 Irradiation cystitis with hematuria: Secondary | ICD-10-CM | POA: Diagnosis not present

## 2019-08-03 DIAGNOSIS — T66XXXS Radiation sickness, unspecified, sequela: Secondary | ICD-10-CM | POA: Diagnosis not present

## 2019-08-03 DIAGNOSIS — N3041 Irradiation cystitis with hematuria: Secondary | ICD-10-CM | POA: Diagnosis not present

## 2019-08-04 DIAGNOSIS — T66XXXS Radiation sickness, unspecified, sequela: Secondary | ICD-10-CM | POA: Diagnosis not present

## 2019-08-04 DIAGNOSIS — N3041 Irradiation cystitis with hematuria: Secondary | ICD-10-CM | POA: Diagnosis not present

## 2019-08-05 DIAGNOSIS — N3041 Irradiation cystitis with hematuria: Secondary | ICD-10-CM | POA: Diagnosis not present

## 2019-08-05 DIAGNOSIS — T66XXXS Radiation sickness, unspecified, sequela: Secondary | ICD-10-CM | POA: Diagnosis not present

## 2019-08-06 DIAGNOSIS — N3041 Irradiation cystitis with hematuria: Secondary | ICD-10-CM | POA: Diagnosis not present

## 2019-08-06 DIAGNOSIS — T66XXXS Radiation sickness, unspecified, sequela: Secondary | ICD-10-CM | POA: Diagnosis not present

## 2019-08-09 DIAGNOSIS — N3041 Irradiation cystitis with hematuria: Secondary | ICD-10-CM | POA: Diagnosis not present

## 2019-08-09 DIAGNOSIS — T66XXXS Radiation sickness, unspecified, sequela: Secondary | ICD-10-CM | POA: Diagnosis not present

## 2019-08-10 DIAGNOSIS — N3041 Irradiation cystitis with hematuria: Secondary | ICD-10-CM | POA: Diagnosis not present

## 2019-08-10 DIAGNOSIS — T66XXXS Radiation sickness, unspecified, sequela: Secondary | ICD-10-CM | POA: Diagnosis not present

## 2019-08-11 DIAGNOSIS — T66XXXS Radiation sickness, unspecified, sequela: Secondary | ICD-10-CM | POA: Diagnosis not present

## 2019-08-11 DIAGNOSIS — N3041 Irradiation cystitis with hematuria: Secondary | ICD-10-CM | POA: Diagnosis not present

## 2019-08-12 DIAGNOSIS — T66XXXS Radiation sickness, unspecified, sequela: Secondary | ICD-10-CM | POA: Diagnosis not present

## 2019-08-12 DIAGNOSIS — N3041 Irradiation cystitis with hematuria: Secondary | ICD-10-CM | POA: Diagnosis not present

## 2019-08-13 DIAGNOSIS — T66XXXS Radiation sickness, unspecified, sequela: Secondary | ICD-10-CM | POA: Diagnosis not present

## 2019-08-13 DIAGNOSIS — N3041 Irradiation cystitis with hematuria: Secondary | ICD-10-CM | POA: Diagnosis not present

## 2019-08-16 DIAGNOSIS — T66XXXS Radiation sickness, unspecified, sequela: Secondary | ICD-10-CM | POA: Diagnosis not present

## 2019-08-16 DIAGNOSIS — N3041 Irradiation cystitis with hematuria: Secondary | ICD-10-CM | POA: Diagnosis not present

## 2019-08-17 DIAGNOSIS — T66XXXS Radiation sickness, unspecified, sequela: Secondary | ICD-10-CM | POA: Diagnosis not present

## 2019-08-17 DIAGNOSIS — N3041 Irradiation cystitis with hematuria: Secondary | ICD-10-CM | POA: Diagnosis not present

## 2019-08-18 DIAGNOSIS — N3041 Irradiation cystitis with hematuria: Secondary | ICD-10-CM | POA: Diagnosis not present

## 2019-08-18 DIAGNOSIS — T66XXXS Radiation sickness, unspecified, sequela: Secondary | ICD-10-CM | POA: Diagnosis not present

## 2019-08-19 DIAGNOSIS — N3041 Irradiation cystitis with hematuria: Secondary | ICD-10-CM | POA: Diagnosis not present

## 2019-08-19 DIAGNOSIS — T66XXXS Radiation sickness, unspecified, sequela: Secondary | ICD-10-CM | POA: Diagnosis not present

## 2019-08-20 DIAGNOSIS — N3041 Irradiation cystitis with hematuria: Secondary | ICD-10-CM | POA: Diagnosis not present

## 2019-08-20 DIAGNOSIS — T66XXXS Radiation sickness, unspecified, sequela: Secondary | ICD-10-CM | POA: Diagnosis not present

## 2019-08-23 DIAGNOSIS — T66XXXS Radiation sickness, unspecified, sequela: Secondary | ICD-10-CM | POA: Diagnosis not present

## 2019-08-23 DIAGNOSIS — N3041 Irradiation cystitis with hematuria: Secondary | ICD-10-CM | POA: Diagnosis not present

## 2019-08-24 DIAGNOSIS — T66XXXS Radiation sickness, unspecified, sequela: Secondary | ICD-10-CM | POA: Diagnosis not present

## 2019-08-24 DIAGNOSIS — N3041 Irradiation cystitis with hematuria: Secondary | ICD-10-CM | POA: Diagnosis not present

## 2019-08-25 DIAGNOSIS — N3041 Irradiation cystitis with hematuria: Secondary | ICD-10-CM | POA: Diagnosis not present

## 2019-08-25 DIAGNOSIS — T66XXXS Radiation sickness, unspecified, sequela: Secondary | ICD-10-CM | POA: Diagnosis not present

## 2019-08-26 DIAGNOSIS — T66XXXS Radiation sickness, unspecified, sequela: Secondary | ICD-10-CM | POA: Diagnosis not present

## 2019-08-26 DIAGNOSIS — N3041 Irradiation cystitis with hematuria: Secondary | ICD-10-CM | POA: Diagnosis not present

## 2019-08-27 DIAGNOSIS — N3041 Irradiation cystitis with hematuria: Secondary | ICD-10-CM | POA: Diagnosis not present

## 2019-08-27 DIAGNOSIS — T66XXXS Radiation sickness, unspecified, sequela: Secondary | ICD-10-CM | POA: Diagnosis not present

## 2019-08-31 DIAGNOSIS — T66XXXS Radiation sickness, unspecified, sequela: Secondary | ICD-10-CM | POA: Diagnosis not present

## 2019-08-31 DIAGNOSIS — N3041 Irradiation cystitis with hematuria: Secondary | ICD-10-CM | POA: Diagnosis not present

## 2019-09-01 DIAGNOSIS — T66XXXS Radiation sickness, unspecified, sequela: Secondary | ICD-10-CM | POA: Diagnosis not present

## 2019-09-01 DIAGNOSIS — N3041 Irradiation cystitis with hematuria: Secondary | ICD-10-CM | POA: Diagnosis not present

## 2019-09-02 DIAGNOSIS — N3041 Irradiation cystitis with hematuria: Secondary | ICD-10-CM | POA: Diagnosis not present

## 2019-09-02 DIAGNOSIS — T66XXXS Radiation sickness, unspecified, sequela: Secondary | ICD-10-CM | POA: Diagnosis not present

## 2019-09-03 DIAGNOSIS — N3041 Irradiation cystitis with hematuria: Secondary | ICD-10-CM | POA: Diagnosis not present

## 2019-09-03 DIAGNOSIS — T66XXXS Radiation sickness, unspecified, sequela: Secondary | ICD-10-CM | POA: Diagnosis not present

## 2019-09-06 DIAGNOSIS — T66XXXS Radiation sickness, unspecified, sequela: Secondary | ICD-10-CM | POA: Diagnosis not present

## 2019-09-06 DIAGNOSIS — N3041 Irradiation cystitis with hematuria: Secondary | ICD-10-CM | POA: Diagnosis not present

## 2019-09-07 DIAGNOSIS — N3041 Irradiation cystitis with hematuria: Secondary | ICD-10-CM | POA: Diagnosis not present

## 2019-09-07 DIAGNOSIS — T66XXXS Radiation sickness, unspecified, sequela: Secondary | ICD-10-CM | POA: Diagnosis not present

## 2019-09-08 DIAGNOSIS — N3041 Irradiation cystitis with hematuria: Secondary | ICD-10-CM | POA: Diagnosis not present

## 2019-09-08 DIAGNOSIS — T66XXXS Radiation sickness, unspecified, sequela: Secondary | ICD-10-CM | POA: Diagnosis not present

## 2019-09-09 DIAGNOSIS — T66XXXS Radiation sickness, unspecified, sequela: Secondary | ICD-10-CM | POA: Diagnosis not present

## 2019-09-09 DIAGNOSIS — N3041 Irradiation cystitis with hematuria: Secondary | ICD-10-CM | POA: Diagnosis not present

## 2019-09-10 DIAGNOSIS — N3041 Irradiation cystitis with hematuria: Secondary | ICD-10-CM | POA: Diagnosis not present

## 2019-09-10 DIAGNOSIS — T66XXXS Radiation sickness, unspecified, sequela: Secondary | ICD-10-CM | POA: Diagnosis not present

## 2019-09-13 DIAGNOSIS — N3041 Irradiation cystitis with hematuria: Secondary | ICD-10-CM | POA: Diagnosis not present

## 2019-09-13 DIAGNOSIS — T66XXXS Radiation sickness, unspecified, sequela: Secondary | ICD-10-CM | POA: Diagnosis not present

## 2019-09-14 DIAGNOSIS — T66XXXS Radiation sickness, unspecified, sequela: Secondary | ICD-10-CM | POA: Diagnosis not present

## 2019-09-14 DIAGNOSIS — N3041 Irradiation cystitis with hematuria: Secondary | ICD-10-CM | POA: Diagnosis not present

## 2019-09-15 DIAGNOSIS — N3041 Irradiation cystitis with hematuria: Secondary | ICD-10-CM | POA: Diagnosis not present

## 2019-09-15 DIAGNOSIS — T66XXXS Radiation sickness, unspecified, sequela: Secondary | ICD-10-CM | POA: Diagnosis not present

## 2019-09-16 DIAGNOSIS — T66XXXS Radiation sickness, unspecified, sequela: Secondary | ICD-10-CM | POA: Diagnosis not present

## 2019-09-16 DIAGNOSIS — N3041 Irradiation cystitis with hematuria: Secondary | ICD-10-CM | POA: Diagnosis not present

## 2019-09-17 DIAGNOSIS — T66XXXS Radiation sickness, unspecified, sequela: Secondary | ICD-10-CM | POA: Diagnosis not present

## 2019-09-17 DIAGNOSIS — N3041 Irradiation cystitis with hematuria: Secondary | ICD-10-CM | POA: Diagnosis not present

## 2019-09-20 DIAGNOSIS — N3041 Irradiation cystitis with hematuria: Secondary | ICD-10-CM | POA: Diagnosis not present

## 2019-09-20 DIAGNOSIS — C61 Malignant neoplasm of prostate: Secondary | ICD-10-CM | POA: Diagnosis not present

## 2019-09-20 DIAGNOSIS — N3040 Irradiation cystitis without hematuria: Secondary | ICD-10-CM | POA: Diagnosis not present

## 2019-09-20 DIAGNOSIS — T66XXXS Radiation sickness, unspecified, sequela: Secondary | ICD-10-CM | POA: Diagnosis not present

## 2019-09-21 DIAGNOSIS — N3041 Irradiation cystitis with hematuria: Secondary | ICD-10-CM | POA: Diagnosis not present

## 2019-09-21 DIAGNOSIS — T66XXXS Radiation sickness, unspecified, sequela: Secondary | ICD-10-CM | POA: Diagnosis not present

## 2019-09-22 DIAGNOSIS — N3041 Irradiation cystitis with hematuria: Secondary | ICD-10-CM | POA: Diagnosis not present

## 2019-09-22 DIAGNOSIS — T66XXXS Radiation sickness, unspecified, sequela: Secondary | ICD-10-CM | POA: Diagnosis not present

## 2019-10-12 ENCOUNTER — Other Ambulatory Visit: Payer: Self-pay | Admitting: Family Medicine

## 2019-10-13 ENCOUNTER — Other Ambulatory Visit: Payer: Self-pay | Admitting: Family Medicine

## 2019-10-13 DIAGNOSIS — I10 Essential (primary) hypertension: Secondary | ICD-10-CM

## 2019-10-27 ENCOUNTER — Encounter: Payer: Self-pay | Admitting: Family Medicine

## 2019-10-27 DIAGNOSIS — K219 Gastro-esophageal reflux disease without esophagitis: Secondary | ICD-10-CM

## 2019-10-29 MED ORDER — OMEPRAZOLE 20 MG PO CPDR: 20.0000 mg | DELAYED_RELEASE_CAPSULE | Freq: Every day | ORAL | 1 refills | Status: DC

## 2019-10-29 MED ORDER — SODIUM BICARBONATE 650 MG PO TABS: ORAL_TABLET | ORAL | 1 refills | Status: DC

## 2020-01-04 DIAGNOSIS — C61 Malignant neoplasm of prostate: Secondary | ICD-10-CM | POA: Diagnosis not present

## 2020-01-05 ENCOUNTER — Other Ambulatory Visit: Payer: Self-pay | Admitting: Family Medicine

## 2020-01-11 DIAGNOSIS — C61 Malignant neoplasm of prostate: Secondary | ICD-10-CM | POA: Diagnosis not present

## 2020-01-11 DIAGNOSIS — N3040 Irradiation cystitis without hematuria: Secondary | ICD-10-CM | POA: Diagnosis not present

## 2020-01-12 ENCOUNTER — Other Ambulatory Visit: Payer: Self-pay | Admitting: Family Medicine

## 2020-01-12 DIAGNOSIS — I10 Essential (primary) hypertension: Secondary | ICD-10-CM

## 2020-01-14 ENCOUNTER — Ambulatory Visit: Payer: Medicare Other | Attending: Internal Medicine

## 2020-01-14 DIAGNOSIS — Z23 Encounter for immunization: Secondary | ICD-10-CM

## 2020-01-14 NOTE — Progress Notes (Signed)
   Covid-19 Vaccination Clinic  Name:  DONDI BYRAM    MRN: [id number] DOB: 12-Jan-1951  01/14/2020  Mr. Howson was observed post Covid-19 immunization for 15 minutes without incidence. He was provided with Vaccine Information Sheet and instruction to access the V-Safe system.   Mr. Prenger was instructed to call 911 with any severe reactions post vaccine: Marland Kitchen Difficulty breathing  . Swelling of your face and throat  . A fast heartbeat  . A bad rash all over your body  . Dizziness and weakness    Immunizations Administered    Name Date Dose VIS Date Route   Pfizer COVID-19 Vaccine 01/14/2020  8:26 AM 0.3 mL 12/03/2019 Intramuscular   Manufacturer: [place]   Lot: [id number]   [place]: [id number]

## 2020-02-03 ENCOUNTER — Ambulatory Visit: Payer: Medicare Other | Attending: Internal Medicine

## 2020-02-03 DIAGNOSIS — Z23 Encounter for immunization: Secondary | ICD-10-CM | POA: Insufficient documentation

## 2020-02-03 NOTE — Progress Notes (Signed)
   Covid-19 Vaccination Clinic  Name:  Zachary [name]    MRN: [id number] DOB: April 08, 1951  02/03/2020  Zachary [name] was observed post Covid-19 immunization for 15 minutes without incidence. He was provided with Vaccine Information Sheet and instruction to access the V-Safe system.   Zachary [name] was instructed to call 911 with any severe reactions post vaccine: Marland Kitchen Difficulty breathing  . Swelling of your face and throat  . A fast heartbeat  . A bad rash all over your body  . Dizziness and weakness    Immunizations Administered    Name Date Dose VIS Date Route   Pfizer COVID-19 Vaccine 02/03/2020 12:39 PM 0.3 mL 12/03/2019 Intramuscular   Manufacturer: [place]   Lot: [id number]   [place]: [id number]

## 2020-02-14 ENCOUNTER — Ambulatory Visit: Payer: Medicare Other

## 2020-04-05 ENCOUNTER — Other Ambulatory Visit: Payer: Self-pay | Admitting: Family Medicine

## 2020-04-13 ENCOUNTER — Other Ambulatory Visit: Payer: Self-pay

## 2020-04-14 ENCOUNTER — Ambulatory Visit (INDEPENDENT_AMBULATORY_CARE_PROVIDER_SITE_OTHER): Payer: Medicare Other | Admitting: Family Medicine

## 2020-04-14 ENCOUNTER — Encounter: Payer: Self-pay | Admitting: Family Medicine

## 2020-04-14 ENCOUNTER — Other Ambulatory Visit (INDEPENDENT_AMBULATORY_CARE_PROVIDER_SITE_OTHER): Payer: Medicare Other

## 2020-04-14 VITALS — BP 108/70 | HR 71 | Temp 95.0°F | Ht 68.0 in | Wt 208.2 lb

## 2020-04-14 DIAGNOSIS — E039 Hypothyroidism, unspecified: Secondary | ICD-10-CM

## 2020-04-14 DIAGNOSIS — E785 Hyperlipidemia, unspecified: Secondary | ICD-10-CM

## 2020-04-14 DIAGNOSIS — I10 Essential (primary) hypertension: Secondary | ICD-10-CM | POA: Diagnosis not present

## 2020-04-14 DIAGNOSIS — K219 Gastro-esophageal reflux disease without esophagitis: Secondary | ICD-10-CM

## 2020-04-14 DIAGNOSIS — Z23 Encounter for immunization: Secondary | ICD-10-CM

## 2020-04-14 LAB — COMPREHENSIVE METABOLIC PANEL
ALT: 22 U/L (ref 0–53)
AST: 18 U/L (ref 0–37)
Albumin: 4.6 g/dL (ref 3.5–5.2)
Alkaline Phosphatase: 65 U/L (ref 39–117)
BUN: 18 mg/dL (ref 6–23)
CO2: 29 mEq/L (ref 19–32)
Calcium: 10 mg/dL (ref 8.4–10.5)
Chloride: 100 mEq/L (ref 96–112)
Creatinine, Ser: 1.01 mg/dL (ref 0.40–1.50)
GFR: 73.82 mL/min (ref >60.00)
Glucose, Bld: 112 mg/dL — ABNORMAL HIGH (ref 70–99)
Potassium: 4.2 mEq/L (ref 3.5–5.1)
Sodium: 136 mEq/L (ref 135–145)
Total Bilirubin: 0.4 mg/dL (ref 0.2–1.2)
Total Protein: 6.9 g/dL (ref 6.0–8.3)

## 2020-04-14 LAB — LDL CHOLESTEROL, DIRECT: Direct LDL: 82 mg/dL

## 2020-04-14 LAB — LIPID PANEL
Cholesterol: 150 mg/dL (ref 0–200)
HDL: 31 mg/dL — ABNORMAL LOW (ref >39.00)
NonHDL: 119.35
Total CHOL/HDL Ratio: 5
Triglycerides: 206 mg/dL — ABNORMAL HIGH (ref 0.0–149.0)
VLDL: 41.2 mg/dL — ABNORMAL HIGH (ref 0.0–40.0)

## 2020-04-14 LAB — T4, FREE: Free T4: 0.94 ng/dL (ref 0.60–1.60)

## 2020-04-14 LAB — TSH: TSH: 4.68 u[IU]/mL — ABNORMAL HIGH (ref 0.35–4.50)

## 2020-04-14 MED ORDER — LISINOPRIL 5 MG PO TABS: ORAL_TABLET | ORAL | 3 refills | Status: DC

## 2020-04-14 MED ORDER — OMEPRAZOLE 20 MG PO CPDR: 20.0000 mg | DELAYED_RELEASE_CAPSULE | Freq: Every day | ORAL | 2 refills | Status: DC

## 2020-04-14 MED ORDER — PRAVASTATIN SODIUM 20 MG PO TABS: 20.0000 mg | ORAL_TABLET | Freq: Every day | ORAL | 3 refills | Status: DC

## 2020-04-14 NOTE — Progress Notes (Signed)
Chief Complaint  Patient presents with  . Follow-up    medication refill BP    Subjective Zachary [name] is a 69 y.o. male who presents for hypertension follow up. He does not monitor home blood pressures. He is compliant with medication- lisinopril 5 mg/d. Patient has these side effects of medication: none He is adhering to a healthy diet overall. Current exercise: walking  Hyperlipidemia Patient presents for dyslipidemia follow up. Currently being treated with pravastatin 20 mg/d and compliance with treatment thus far has been good. He denies myalgias. Diet/exercise as above. The patient is not known to have coexisting coronary artery disease.   Hypothyroidism Patient presents for follow-up of hypothyroidism.  Reports compliance with medication- Synthroid 100 mcg/d Current symptoms include: denies fatigue, weight changes, heat/cold intolerance, bowel/skin changes or CVS symptoms He believes his dose should be unchanged  Past Medical History:  Diagnosis Date  . Arthritis   . Articular cartilage disease    left shoulder  . Cancer ([place]) 2015   positive prostate cancer bx  . Diverticulitis   . Dyslipidemia (high LDL; low HDL) 08/15/2016  . GERD (gastroesophageal reflux disease)   . Hearing problem    hearing deficit  . Hemorrhoids   . Hypertension   . Hypothyroidism   . Sleep apnea    uses a cpap  . Wears glasses   . Wears hearing aid    both ears    Exam BP 108/70 (BP Location: Left Arm, Patient Position: Sitting, Cuff Size: Normal)   Pulse 71   Temp (!) 95 F (35 C) (Temporal)   Ht 5\' 8"  (1.727 m)   Wt 208 lb 4 oz (94.5 kg)   SpO2 96%   BMI 31.66 kg/m  General:  well developed, well nourished, in no apparent distress Heart: RRR, no bruits, no LE edema Lungs: clear to auscultation, no accessory muscle use Psych: well oriented with normal range of affect and appropriate judgment/insight  Essential hypertension - Plan: lisinopril (ZESTRIL) 5 MG tablet,  Comprehensive metabolic panel  Gastroesophageal reflux disease without esophagitis - Plan: omeprazole (PRILOSEC) 20 MG capsule  Dyslipidemia (high LDL; low HDL) - Plan: Lipid panel  Hypothyroidism, unspecified type - Plan: TSH  Orders as above. Counseled on diet and exercise. F/u in 6 mo. The patient voiced understanding and agreement to the plan.  [place], DO 04/14/20  9:17 AM

## 2020-04-14 NOTE — Addendum Note (Signed)
Addended by: Sharon Seller B on: 04/14/2020 09:23 AM   Modules accepted: Orders

## 2020-04-14 NOTE — Patient Instructions (Signed)
Give us 2-3 business days to get the results of your labs back.   Keep the diet clean and stay active.  Let us know if you need anything. 

## 2020-05-03 ENCOUNTER — Other Ambulatory Visit: Payer: Self-pay | Admitting: Family Medicine

## 2020-06-21 ENCOUNTER — Other Ambulatory Visit: Payer: Self-pay | Admitting: Family Medicine

## 2020-07-05 ENCOUNTER — Other Ambulatory Visit: Payer: Self-pay | Admitting: Family Medicine

## 2020-07-06 DIAGNOSIS — C61 Malignant neoplasm of prostate: Secondary | ICD-10-CM | POA: Diagnosis not present

## 2020-07-12 DIAGNOSIS — L600 Ingrowing nail: Secondary | ICD-10-CM | POA: Insufficient documentation

## 2020-07-26 ENCOUNTER — Other Ambulatory Visit: Payer: Self-pay | Admitting: Family Medicine

## 2020-09-15 ENCOUNTER — Ambulatory Visit: Payer: Medicare Other | Attending: Internal Medicine

## 2020-09-15 DIAGNOSIS — Z23 Encounter for immunization: Secondary | ICD-10-CM

## 2020-09-15 NOTE — Progress Notes (Signed)
   Covid-19 Vaccination Clinic  Name:  Zachary [name]    MRN: [id number] DOB: [birth year]  09/15/2020  Zachary [name] was observed post Covid-19 immunization for 15 minutes without incident. He was provided with Vaccine Information Sheet and instruction to access the V-Safe system. Vaccinated by [facility].  Zachary [name] was instructed to call 911 with any severe reactions post vaccine: Marland Kitchen Difficulty breathing  . Swelling of face and throat  . A fast heartbeat  . A bad rash all over body  . Dizziness and weakness

## 2020-09-18 MED FILL — PFIZER-BIONTECH COVID-19 VA: 30 | 1 days supply | Qty: 0 | Fill #0

## 2020-09-20 ENCOUNTER — Other Ambulatory Visit: Payer: Self-pay | Admitting: Family Medicine

## 2020-09-27 ENCOUNTER — Other Ambulatory Visit: Payer: Self-pay

## 2020-09-27 ENCOUNTER — Encounter: Payer: Self-pay | Admitting: Family Medicine

## 2020-09-27 ENCOUNTER — Ambulatory Visit (INDEPENDENT_AMBULATORY_CARE_PROVIDER_SITE_OTHER): Payer: Medicare Other | Admitting: Family Medicine

## 2020-09-27 VITALS — BP 108/68 | HR 87 | Temp 98.0°F | Ht 68.0 in | Wt 199.2 lb

## 2020-09-27 DIAGNOSIS — R2689 Other abnormalities of gait and mobility: Secondary | ICD-10-CM

## 2020-09-27 DIAGNOSIS — Z23 Encounter for immunization: Secondary | ICD-10-CM | POA: Diagnosis not present

## 2020-09-27 DIAGNOSIS — R413 Other amnesia: Secondary | ICD-10-CM

## 2020-09-27 NOTE — Progress Notes (Signed)
Chief Complaint  Patient presents with   Follow-up    Subjective: Patient is a 69 y.o. male here for memory concerns.  Over the past 4 to 5 months, the patient is experiencing worsening memory.  For instance, he will sometimes forget where the clock and area is for work.  He will also go to work and forget why he is there.  Other times he will forget things like what someone told him was for dinner or the name of a coworker.  His mood is fine overall.  No unexplained weight changes.  Has been eating normally.  He has been having associated intermittent dizziness.  It last for 4-5 minutes at a time.  He has stumbled several times because of it.  Does not feel like lightheadedness or spinning.  There appears to be no triggers for including position or moving his head.    Past Medical History:  Diagnosis Date   Arthritis    Articular cartilage disease    left shoulder   Cancer ([place]) 2015   positive prostate cancer bx   Diverticulitis    Dyslipidemia (high LDL; low HDL) 08/15/2016   GERD (gastroesophageal reflux disease)    Hearing problem    hearing deficit   Hemorrhoids    Hypertension    Hypothyroidism    Sleep apnea    uses a cpap   Wears glasses    Wears hearing aid    both ears    Objective: BP 108/68 (BP Location: Left Arm, Patient Position: Sitting, Cuff Size: Normal)    Pulse 87    Temp 98 F (36.7 C) (Oral)    Ht 5\' 8"  (1.727 m)    Wt 199 lb 4 oz (90.4 kg)    SpO2 97%    BMI 30.30 kg/m  General: Awake, appears stated age Heart: RRR, no bruits Neuro: Strength 5/5 throughout; DTRs equal and symmetric throughout without clonus, gait is normal, no cerebellar signs, speech is fluent, alert and oriented to person, place, time, and president Lungs: CTAB, no rales, wheezes or rhonchi. No accessory muscle use Psych: Age appropriate judgment and insight, normal affect and mood  Assessment and Plan: Memory difficulty - Plan: Ambulatory referral to Neuropsychology,  TSH, T4, free, Comprehensive metabolic panel, E52  Balance problem - Plan: CBC  Need for influenza vaccination - Plan: Flu Vaccine QUAD High Dose(Fluad)  Check labs.  Refer to neuropsychology for formal testing.  If labs are normal, I will obtain an MRI of his brain to rule out structural causes for both memory and balance issues.  I would like to hold off on adding medication such as Aricept and Namenda until he sees the neuropsychology team.  Stay active, continue to read on a daily basis. Follow-up pending the above. The patient voiced understanding and agreement to the plan.  [place], DO 09/27/20  10:37 AM

## 2020-09-27 NOTE — Patient Instructions (Addendum)
Give [place] 2-3 business days to get the results of your labs back.   If you do not hear anything about your referral in the next 1-2 weeks, call our office and ask for an update.  If labs are normal, we will get you set up with the brain scan.   Let us know if you need anything.

## 2020-09-28 ENCOUNTER — Other Ambulatory Visit: Payer: Self-pay | Admitting: Family Medicine

## 2020-09-28 DIAGNOSIS — R2689 Other abnormalities of gait and mobility: Secondary | ICD-10-CM

## 2020-09-28 DIAGNOSIS — R413 Other amnesia: Secondary | ICD-10-CM

## 2020-09-28 LAB — COMPREHENSIVE METABOLIC PANEL
AG Ratio: 2 (calc) (ref 1.0–2.5)
ALT: 20 U/L (ref 9–46)
AST: 17 U/L (ref 10–35)
Albumin: 4.5 g/dL (ref 3.6–5.1)
Alkaline phosphatase (APISO): 66 U/L (ref 35–144)
BUN: 16 mg/dL (ref 7–25)
CO2: 27 mmol/L (ref 20–32)
Calcium: 10.3 mg/dL (ref 8.6–10.3)
Chloride: 103 mmol/L (ref 98–110)
Creat: 1.06 mg/dL (ref 0.70–1.25)
Globulin: 2.3 g/dL (calc) (ref 1.9–3.7)
Glucose, Bld: 118 mg/dL — ABNORMAL HIGH (ref 65–99)
Potassium: 4.5 mmol/L (ref 3.5–5.3)
Sodium: 139 mmol/L (ref 135–146)
Total Bilirubin: 0.4 mg/dL (ref 0.2–1.2)
Total Protein: 6.8 g/dL (ref 6.1–8.1)

## 2020-09-28 LAB — CBC
HCT: 46 % (ref 38.5–50.0)
Hemoglobin: 15.7 g/dL (ref 13.2–17.1)
MCH: 31.7 pg (ref 27.0–33.0)
MCHC: 34.1 g/dL (ref 32.0–36.0)
MCV: 92.7 fL (ref 80.0–100.0)
MPV: 11.1 fL (ref 7.5–12.5)
Platelets: 253 10*3/uL (ref 140–400)
RBC: 4.96 10*6/uL (ref 4.20–5.80)
RDW: 12.8 % (ref 11.0–15.0)
WBC: 6.7 10*3/uL (ref 3.8–10.8)

## 2020-09-28 LAB — T4, FREE: Free T4: 1.3 ng/dL (ref 0.8–1.8)

## 2020-09-28 LAB — TSH: TSH: 1.64 mIU/L (ref 0.40–4.50)

## 2020-09-28 LAB — VITAMIN B12: Vitamin B-12: 601 pg/mL (ref 200–1100)

## 2020-10-04 ENCOUNTER — Other Ambulatory Visit: Payer: Self-pay | Admitting: Family Medicine

## 2020-10-07 ENCOUNTER — Ambulatory Visit (HOSPITAL_BASED_OUTPATIENT_CLINIC_OR_DEPARTMENT_OTHER)
Admission: RE | Admit: 2020-10-07 | Discharge: 2020-10-07 | Disposition: A | Discharge status: Home / Self Care | Payer: Medicare Other | Source: Ambulatory Visit | Attending: Family Medicine | Admitting: Family Medicine

## 2020-10-07 ENCOUNTER — Other Ambulatory Visit: Payer: Self-pay

## 2020-10-07 DIAGNOSIS — R413 Other amnesia: Secondary | ICD-10-CM

## 2020-10-07 DIAGNOSIS — R2689 Other abnormalities of gait and mobility: Secondary | ICD-10-CM | POA: Diagnosis not present

## 2020-10-09 MED ORDER — ATORVASTATIN CALCIUM 80 MG PO TABS: 80.0000 mg | ORAL_TABLET | Freq: Every day | ORAL | 3 refills | Status: DC

## 2020-10-09 NOTE — Telephone Encounter (Signed)
Changed pravastatin to max dose atorvastatin w hx of lacunar infarct.

## 2020-10-15 ENCOUNTER — Emergency Department (INDEPENDENT_AMBULATORY_CARE_PROVIDER_SITE_OTHER)
Admission: EM | Admit: 2020-10-15 | Discharge: 2020-10-15 | Disposition: A | Discharge status: Home / Self Care | Payer: Medicare Other | Source: Home / Self Care | Attending: Family Medicine | Admitting: Family Medicine

## 2020-10-15 ENCOUNTER — Other Ambulatory Visit: Payer: Self-pay

## 2020-10-15 DIAGNOSIS — R0981 Nasal congestion: Secondary | ICD-10-CM | POA: Diagnosis not present

## 2020-10-15 MED ORDER — PREDNISONE 20 MG PO TABS: ORAL_TABLET | ORAL | 0 refills | Status: DC

## 2020-10-15 NOTE — ED Triage Notes (Signed)
Pt states that he has some facial pain, and nasal congestion x2 days. Pt states that he is vaccinated.

## 2020-10-15 NOTE — Discharge Instructions (Addendum)
May add Pseudoephedrine (30mg , one or two every 4 to 6 hours) for sinus congestion.  Get adequate rest.    May use Afrin nasal spray (or generic oxymetazoline) each morning for about 5 days and then discontinue.  Also recommend using saline nasal spray several times daily and saline nasal irrigation (AYR is a common brand).  Use Flonase nasal spray each morning after using Afrin nasal spray and saline nasal irrigation. If cough becomes worse, take plain guaifenesin (1200mg  extended release tabs such as Mucinex) twice daily, with plenty of water. May take Delsym Cough Suppressant ("12 Hour Cough Relief") at bedtime for nighttime cough.  Try warm salt water gargles for sore throat.  Stop all antihistamines for now, and other non-prescription cough/cold preparations.

## 2020-10-15 NOTE — ED Provider Notes (Signed)
[name]    CSN: [id number] Arrival date & time: 10/15/20  0917      History   Chief Complaint Chief Complaint  Patient presents with  . Facial Pain    HPI Zachary [name] is a 69 y.o. male.   During the past three days patient has developed sneezing, nasal congestion, facial pressure, headache, and mild fatigue.  He denies sore throat, cough, and fever.  He has a history of seasonal rhinitis.  The history is provided by the patient.    Past Medical History:  Diagnosis Date  . Arthritis   . Articular cartilage disease    left shoulder  . Cancer ([place]) 2015   positive prostate cancer bx  . Diverticulitis   . Dyslipidemia (high LDL; low HDL) 08/15/2016  . GERD (gastroesophageal reflux disease)   . Hearing problem    hearing deficit  . Hemorrhoids   . Hypertension   . Hypothyroidism   . Sleep apnea    uses a cpap  . Wears glasses   . Wears hearing aid    both ears    Patient Active Problem List   Diagnosis Date Noted  . Prediabetes 05/27/2018  . Stomatitis 04/17/2017  . Dyslipidemia (high LDL; low HDL) 08/15/2016  . S/P arthroscopy of shoulder 05/31/2016  . History of prostate cancer 05/09/2016  . Complete rotator cuff tear of left shoulder 07/05/2015  . Tennis elbow 05/30/2014  . Rotator cuff (capsule) sprain 03/10/2014  . Right shoulder pain 02/11/2014  . Memory difficulty 12/18/2012  . Gynecomastia, male 07/30/2012  . OSA (obstructive sleep apnea) 04/23/2011  . EXTERNAL HEMORRHOIDS 02/12/2011  . Prostate cancer ([place]) 11/12/2010  . PLANTAR FASCIITIS 04/09/2010  . Hypothyroidism 12/08/2009  . NEOPLASM OF UNCERTAIN BEHAVIOR OF SKIN 03/10/2009  . OSTEOARTHRITIS 02/14/2009  . HEARING DEFICIT 02/08/2009  . Essential hypertension 02/08/2009  . Allergic rhinitis 02/08/2009  . GERD 02/08/2009  . PROTEINURIA 02/08/2009  . DIVERTICULITIS, HX OF 02/08/2009    Past Surgical History:  Procedure Laterality Date  . BACK SURGERY  1978    herniated disksurgery  . CARDIAC CATHETERIZATION  08/01/2010  . COLONOSCOPY    . EXAM UNDER ANESTHESIA WITH MANIPULATION OF SHOULDER Right 09/15/2014   Procedure: RIGHT SHOULDER MANIPULATION UNDER ANESTHESIA;  Surgeon: Ninetta Lights, MD;  Location: [facility];  Service: Orthopedics;  Laterality: Right;  . HEMORRHOID SURGERY  08/2006  . SHOULDER ARTHROSCOPY WITH DISTAL CLAVICLE RESECTION Left 07/07/2015   Procedure: SHOULDER ARTHROSCOPY WITH DISTAL CLAVICLE RESECTION;  Surgeon: Kathryne Hitch, MD;  Location: [facility] [place];  Service: Orthopedics;  Laterality: Left;  . SHOULDER ARTHROSCOPY WITH ROTATOR CUFF REPAIR Left 12/07/2015   Procedure: LEFT SHOULDER ARTHROSCOPY DEBRIDEMENT, WITH ROTATOR CUFF REPAIR;  Surgeon: Ninetta Lights, MD;  Location: [facility];  Service: Orthopedics;  Laterality: Left;  . SHOULDER ARTHROSCOPY WITH ROTATOR CUFF REPAIR AND SUBACROMIAL DECOMPRESSION Left 07/07/2015   Procedure: LEFT SHOULDER SCOPE DEBRIDEMENT, SUBACROMIAL DECOMPRESSION, DISTAL CLAVICULECTOMY, ROTATOR CUFF REPAIR  ;  Surgeon: Kathryne Hitch, MD;  Location: [facility];  Service: Orthopedics;  Laterality: Left;  ANESTHESIA: GENERAL, PRE/POST OP SCALENE  . SHOULDER ARTHROSCOPY WITH SUBACROMIAL DECOMPRESSION, ROTATOR CUFF REPAIR AND BICEP TENDON REPAIR Right 03/10/2014   Procedure: RIGHT SHOULDER ARTHROSCOPY WITH SUBACROMIAL DECOMPRESSION, PARTIAL ACROMIOPLASTY WITH CORACOAROMIAL LABRUM DEBRIDEMENT RELEASE DISTAL CLAVICULECTOMY,  ROTATOR CUFF REPAIR AND EXTENSIVE DEBRIDEMENT;  Surgeon: Ninetta Lights, MD;  Location: [facility];  Service: Orthopedics;  Laterality: Right;  .  TENDON REPAIR  June 06, 2011   right elbow, Dr. Percell Miller       Home Medications    Prior to Admission medications   Medication Sig Start Date End Date Taking? Authorizing Provider  aspirin EC 81 MG EC tablet Take 81 mg by mouth daily.     Yes [provider]    atorvastatin (LIPITOR) 80 MG tablet Take 1 tablet (80 mg total) by mouth daily. 10/09/20  Yes [name], DO  EUTHYROX 100 MCG tablet Take 1 tablet by mouth once daily 10/04/20  Yes Wendling, Crosby Oyster, DO  lisinopril (ZESTRIL) 5 MG tablet TAKE 1 TABLET BY MOUTH ONCE DAILY . 04/14/20  Yes [name], DO  loratadine (CLARITIN) 10 MG tablet Take 10 mg by mouth daily.     Yes [provider]  Melatonin 5 MG CAPS Take 2 capsules by mouth daily.   Yes [provider]  sodium bicarbonate 650 MG tablet Take 1 tablet by mouth once daily 07/27/20  Yes Wendling, Crosby Oyster, DO  omeprazole (PRILOSEC) 20 MG capsule Take 1 capsule (20 mg total) by mouth daily. 04/14/20   [name], DO  predniSONE (DELTASONE) 20 MG tablet Take one tab by mouth twice daily for 4 days, then one daily for 3 days. Take with food. 10/15/20   Kandra Nicolas, MD    Family History Family History  Problem Relation Age of Onset  . Heart disease Father   . Breast cancer Mother   . Cancer Brother   . Alcohol abuse Other   . Arthritis Other   . Cancer Other        Breast, Prostate  . Coronary artery disease Other   . Irritable bowel syndrome Other   . Cystic fibrosis Other   . Colon cancer Neg Hx     Social History Social History   Tobacco Use  . Smoking status: Never Smoker  . Smokeless tobacco: Never Used  Substance Use Topics  . Alcohol use: No  . Drug use: No     Allergies   Oxycodone and Simvastatin   Review of Systems Review of Systems No sore throat No cough + sneezing No pleuritic pain No wheezing + nasal congestion + post-nasal drainage + mild sinus pain/pressure No itchy/red eyes No earache No hemoptysis No SOB No fever/chills No nausea No vomiting No abdominal pain No diarrhea No urinary symptoms No skin rash + fatigue No myalgias + headache Used OTC meds (Alka Seltzer) without relief   Physical Exam Triage Vital  Signs ED Triage Vitals  Enc Vitals Group     BP 10/15/20 0934 124/79     Pulse Rate 10/15/20 0934 60     Resp --      Temp 10/15/20 0934 98.6 F (37 C)     Temp Source 10/15/20 0934 Oral     SpO2 10/15/20 0934 97 %     Weight 10/15/20 0932 197 lb (89.4 kg)     Height 10/15/20 0932 5\' 8"  (1.727 m)     Head Circumference --      Peak Flow --      Pain Score 10/15/20 0932 9     Pain Loc --      Pain Edu? --      Excl. in [place]? --    No data found.  Updated Vital Signs BP 124/79 (BP Location: Left Arm)   Pulse 60   Temp 98.6 F (37 C) (Oral)  Ht 5\' 8"  (1.727 m)   Wt 89.4 kg   SpO2 97%   BMI 29.95 kg/m   Visual Acuity Right Eye Distance:   Left Eye Distance:   Bilateral Distance:    Right Eye Near:   Left Eye Near:    Bilateral Near:     Physical Exam Nursing notes and Vital Signs reviewed. Appearance:  Patient appears stated age, and in no acute distress Eyes:  Pupils are equal, round, and reactive to light and accomodation.  Extraocular movement is intact.  Conjunctivae are not inflamed  Ears:  Canals normal.  Tympanic membranes normal.  Nose:  Mildly congested turbinates.  No sinus tenderness.   Pharynx:  Normal Neck:  Supple.  Mildly enlarged lateral nodes are present, nontender.  Lungs:  Clear to auscultation.  Breath sounds are equal.  Moving air well. Heart:  Regular rate and rhythm without murmurs, rubs, or gallops.  Extremities:  No edema.  Skin:  No rash present.   UC Treatments / Results  Labs (all labs ordered are listed, but only abnormal results are displayed) Labs Reviewed - No data to display  EKG   Radiology No results found.  Procedures Procedures (including critical care time)  Medications Ordered in UC Medications - No data to display  Initial Impression / Assessment and Plan / UC Course  I have reviewed the triage vital signs and the nursing notes.  Pertinent labs & imaging results that were available during my care of the  patient were reviewed by me and considered in my medical decision making (see chart for details).    Benign exam.  There is no evidence of bacterial infection today.  Suspect early viral URI. Because of his history of seasonal rhinitis, begin prednisone burst/taper. Followup with Family Doctor if not improved in about 10 days.   Final Clinical Impressions(s) / UC Diagnoses   Final diagnoses:  Nasal sinus congestion     Discharge Instructions     May add Pseudoephedrine (30mg , one or two every 4 to 6 hours) for sinus congestion.  Get adequate rest.    May use Afrin nasal spray (or generic oxymetazoline) each morning for about 5 days and then discontinue.  Also recommend using saline nasal spray several times daily and saline nasal irrigation (AYR is a common brand).  Use Flonase nasal spray each morning after using Afrin nasal spray and saline nasal irrigation. If cough becomes worse, take plain guaifenesin (1200mg  extended release tabs such as Mucinex) twice daily, with plenty of water. May take Delsym Cough Suppressant ("12 Hour Cough Relief") at bedtime for nighttime cough.  Try warm salt water gargles for sore throat.  Stop all antihistamines for now, and other non-prescription cough/cold preparations.     ED Prescriptions    Medication Sig Dispense Auth. Provider   predniSONE (DELTASONE) 20 MG tablet Take one tab by mouth twice daily for 4 days, then one daily for 3 days. Take with food. 11 tablet Kandra Nicolas, MD        Kandra Nicolas, MD 10/16/20 [id number]

## 2020-10-25 ENCOUNTER — Other Ambulatory Visit: Payer: Self-pay | Admitting: Family Medicine

## 2020-11-01 ENCOUNTER — Encounter: Payer: Self-pay | Admitting: Counselor

## 2020-11-01 ENCOUNTER — Ambulatory Visit (INDEPENDENT_AMBULATORY_CARE_PROVIDER_SITE_OTHER): Payer: Medicare Other | Admitting: Counselor

## 2020-11-01 ENCOUNTER — Ambulatory Visit: Payer: Medicare Other | Admitting: Psychology

## 2020-11-01 ENCOUNTER — Other Ambulatory Visit: Payer: Self-pay

## 2020-11-01 DIAGNOSIS — R42 Dizziness and giddiness: Secondary | ICD-10-CM

## 2020-11-01 DIAGNOSIS — R413 Other amnesia: Secondary | ICD-10-CM

## 2020-11-01 DIAGNOSIS — F09 Unspecified mental disorder due to known physiological condition: Secondary | ICD-10-CM

## 2020-11-01 DIAGNOSIS — G4733 Obstructive sleep apnea (adult) (pediatric): Secondary | ICD-10-CM

## 2020-11-01 NOTE — Progress Notes (Signed)
[facility]  Patient Name: Zachary [name] MRN: [id number] Date of Birth: January 30, 1951 Age: 69 y.o. Education: 12 years  Referral Circumstances and Background Information  Mr. Allie Gerhold is a 69 y.o., right-hand dominant, married man with a history of HTN, HLD, OSA (on CPAP) and memory and thinking problems. They were referred by our primary care partners at [facility] My thanks to Dr. Nani Ravens for his thorough and helpful workup, including an MRI of the brain.   On interview, the patient and his wife reported that he has cognitive changes over perhaps the past 5 months. They have been stable to a bit worse over time, with a period of "severe confusion" after he started taking a Statin, which they have since discontinued. His mental status has improved but he remains with some issues. The first changes noticed were absentmindedness, he forgot to close the garage door several times, he forgot to put the coffee pot under the coffee, and he also was having some minor difficulties with wood working. He is still working part time, 2 days a week, and also appreciates some changes there. They moved the timeclock and he couldn't recall where they had put it even though they showed him the day before. He reported that these things are "starting to drive him nuts." He will be at work, and he will have to let the phone ring 3 times because he can't remember where he is for a moment, and he is supposed to announce it when he answers the phone. His wife was a bit dramatic in her presentation of symptoms and in general, characterized his problems as more significant than he did, although they both have noticed. On specific review of symptoms, he forgets things that she tells him more than in the past, although there is no rapid forgetting of information, he isn't repeating himself, and he isn't forgetting entire events. He does seem inattentive to  his wife. He specifically denied problems with word finding, orientation to time, or taking care of his schedule. They said that he has always been a little absent minded although his wife was clear that this seems worse than in the past. Their daughter was living with them until several weeks ago and didn't notice the problems, and nobody at work has noticed problems, although his job is admittedly simple and they said their daughter is in "her own world." With respect to mood, he reported that he is happy, although it sounds like he is a hot tempered and anxious sort. He has mellowed out to some extent but this is still an issue. His energy is sufficient although not what it was in the past. His appetite is good. He stated that he is not sleeping well, he is up and down all night and often is worrying about things. He also has incontinence related to prostate problems. He estimated he gets about 4 or 5 hours of sleep a night, for "years." He also has OSA but uses a CPAP.   The patient reported some physical symptoms, he is getting headaches, then he will get dizzy, and it lasts for 5 to 15 minutes. This can happen once a day or 2-3 times a day, they are unilateral on the right, in the parietooccipital region. They are associated with balance disturbance and cause staggering. He reported that he has no history of headaches. They are not associated with aura.   With respect to functioning, the patient has  some day-to-day interference although it doesn't sound as though he is functionally impaired. His wife manage the finances and always has. They specifically denied any problems with driving, with managing money, medication management, he helps neighbors with home improvement projects, and he has no difficulties driving. He makes wood crafts and is generally good, although he has had to consult the plans for them because he is shaky on the details.   Past Medical History and Review of Relevant Studies    Patient Active Problem List   Diagnosis Date Noted  . Prediabetes 05/27/2018  . Stomatitis 04/17/2017  . Dyslipidemia (high LDL; low HDL) 08/15/2016  . S/P arthroscopy of shoulder 05/31/2016  . History of prostate cancer 05/09/2016  . Complete rotator cuff tear of left shoulder 07/05/2015  . Tennis elbow 05/30/2014  . Rotator cuff (capsule) sprain 03/10/2014  . Right shoulder pain 02/11/2014  . Memory difficulty 12/18/2012  . Gynecomastia, male 07/30/2012  . OSA (obstructive sleep apnea) 04/23/2011  . EXTERNAL HEMORRHOIDS 02/12/2011  . Prostate cancer ([place]) 11/12/2010  . PLANTAR FASCIITIS 04/09/2010  . Hypothyroidism 12/08/2009  . NEOPLASM OF UNCERTAIN BEHAVIOR OF SKIN 03/10/2009  . OSTEOARTHRITIS 02/14/2009  . HEARING DEFICIT 02/08/2009  . Essential hypertension 02/08/2009  . Allergic rhinitis 02/08/2009  . GERD 02/08/2009  . PROTEINURIA 02/08/2009  . DIVERTICULITIS, HX OF 02/08/2009   Review of Neuroimaging and Relevant Medical History: There is a very minimal lacunar type infarct in the right cerebellar hemisphere and there is a mild burden of scattered leukoaraiosis in the periventricular and subcortical cerebral white matter. There are some additional minimal white matter changes in the region of the PONS. From a volume loss perspective, there are no areas of concerning or clearly pathologic atrophy, though there are some mild and likely involutional changes. Cerebellar and midbrain volume are well preserved. Overall, the imaging is not explanatory of his cognitive impairment and is not diagnostic, although one might think of at least a cerebrovascular contribution.   Current Outpatient Medications  Medication Sig Dispense Refill  . aspirin EC 81 MG EC tablet Take 81 mg by mouth daily.      Marland Kitchen atorvastatin (LIPITOR) 80 MG tablet Take 1 tablet (80 mg total) by mouth daily. 90 tablet 3  . EUTHYROX 100 MCG tablet Take 1 tablet by mouth once daily 90 tablet 0  . lisinopril  (ZESTRIL) 5 MG tablet TAKE 1 TABLET BY MOUTH ONCE DAILY . 90 tablet 3  . loratadine (CLARITIN) 10 MG tablet Take 10 mg by mouth daily.      . Melatonin 5 MG CAPS Take 2 capsules by mouth daily.    Marland Kitchen omeprazole (PRILOSEC) 20 MG capsule Take 1 capsule (20 mg total) by mouth daily. 90 capsule 2  . predniSONE (DELTASONE) 20 MG tablet Take one tab by mouth twice daily for 4 days, then one daily for 3 days. Take with food. 11 tablet 0  . sodium bicarbonate 650 MG tablet Take 1 tablet by mouth once daily 90 tablet 0   No current facility-administered medications for this visit.   Family History  Problem Relation Age of Onset  . Heart disease Father   . Breast cancer Mother   . Cancer Brother   . Alcohol abuse Other   . Arthritis Other   . Cancer Other        Breast, Prostate  . Coronary artery disease Other   . Irritable bowel syndrome Other   . Cystic fibrosis Other   . Colon cancer  Neg Hx    There is no  family history of dementia. His mother did have some strokes. A brother has suspected Parkinson's disease and is seeing a neurologist soon. There is no  family history of psychiatric illness.   Psychosocial History  Developmental, Educational and Employment History: The patient denied any history of abuse or neglect. He reported that he was an average student although he did not enjoy school. He denied ever being held back or having any learning problems. 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   Psychiatric History: The patient has not had any formal treatment. He has always been anxious as per him and his wife. He "worries a lot."   Substance Use History: The patient doesn't drink, smoke, or use drugs.    Relationship History and Living Cimcumstances: The patient and his wife have been married for 43 years. They have two children, a daughter and a son, both around 79.   Mental Status and Behavioral Observations  Sensorium/Arousal: The patient's level of arousal was awake and alert. Hearing and vision were adequate with correction (glasses) for testing purposes. Orientation: The patient was alert and fully oriented to person, place, time, and situation.  Appearance: Dressed in appropriate, casual clothing Behavior: The patient was pleasant and appropriate and presented as fairly lucid if not a bit distractible Speech/language: Normal in rate, rhythm, volume, and prosody.  Gait/Posture: Not formally examined, appeared narrow based and normal on ambulation within the clinic.  Movement: No overt signs/symptoms of movement disorder noted on observation Social Comportment: Pleasant, appropriate Mood: The patient reported his mood as "good" but also admits he tends to be anxious Affect: Euthymic to neutral Thought process/content: Thought process was logical and goal oriented although he seemed to have a hard time staying on task with cognitive tasks.  Safety: No safety concerns identified in this euthymic patient Insight: [facility] Abide Cognitive Assessment  11/01/2020  Visuospatial/ Executive (0/5) 2  Naming (0/3) 3  Attention: Read list of digits (0/2) 1  Attention: Read list of letters (0/1) 1  Attention: Serial 7 subtraction starting at 100 (0/3) 3  Language: Repeat phrase (0/2) 0  Language : Fluency (0/1) 0  Abstraction (0/2) 0  Delayed Recall (0/5) 3  Orientation (0/6) 6  Total 19  Adjusted Score (based on education) 20   Test Procedures  Wide Range Achievement Test - 4             Word Reading Doy Mince' Intellectual Screening Test Neuropsychological Assessment Battery  Memory Module  Naming  Digit Span Repeatable Battery for the Assessment of Neuropsychological Status (Form  A)  Figure Copy  Judgment of Line Orientation  Coding  Figure Recall The Dot Counting Test A Random Letter Test Controlled Oral Word Association (F-A-S) Semantic Fluency (Animals) Trail Making Test A & B Complex Ideational Material Modified [place] Card Sorting Test Geriatric Depression Scale - Short Form Quick Dementia Rating System (completed by wife, Neoma Laming)  Plan  OLANDA DOWNIE was seen for a psychiatric diagnostic evaluation and neuropsychological testing. He is a very pleasant, 69 year old, right-hand dominant man. His wife presents as quite concerned about his cognition although she admits that she is a bit compulsive. His  history is not particularly concerning but he is screening in the mildly impaired range on the MoCA. He likely has MCI at most, and testing will be helpful to better characterize his issues. Full and complete note with impressions, recommendations, and interpretation of test data to follow.   Viviano Simas Nicole Kindred, PsyD, [place] Clinical Neuropsychologist  Informed Consent and Coding/Compliance  Risks and benefits of the evaluation were discussed with the patient prior to all testing procedures. I conducted a clinical interview and neuropsychological testing (at least two tests) with Leslie Andrea and Milana Kidney, B.S. (Technician) administered additional test procedures. The patient was able to tolerate the testing procedures and the patient (and/or family if applicable) is likely to benefit from further follow up to receive the diagnosis and treatment recommendations, which will be rendered at the next encounter. Billing below reflects technician time, my direct face-to-face time with the patient, time spent in test administration, and time spent in professional activities including but not limited to: neuropsychological test interpretation, integration of neuropsychological test data with clinical history, report preparation, treatment planning, care  coordination, and review of diagnostically pertinent medical history or studies.   Services associated with this encounter: Clinical Interview [phone]) plus 60 minutes (18550; Neuropsychological Evaluation by Professional)  120 minutes (15868; Neuropsychological Evaluation by Professional, Adl.) 18 minutes (25749; Test Administration by Professional) 30 minutes (35521; Neuropsychological Testing by Technician) 75 minutes (74715; Neuropsychological Testing by Technician, Adl.)

## 2020-11-01 NOTE — Progress Notes (Signed)
   Psychometrist Note   Cognitive testing was administered to Zachary [name] by Milana Kidney, B.S. (Technician) under the supervision of Alphonzo Severance, Psy.D., ABN. Zachary [name] was able to tolerate all test procedures. Dr. Nicole Kindred met with the patient as needed to manage any emotional reactions to the testing procedures. Rest breaks were offered.    The battery of tests administered was selected by Dr. Nicole Kindred with consideration to the patient's current level of functioning, the nature of his symptoms, emotional and behavioral responses during the interview, level of literacy, observed level of motivation/effort, and the nature of the referral question. This battery was communicated to the psychometrist. Communication between Dr. Nicole Kindred and the psychometrist was ongoing throughout the evaluation and Dr. Nicole Kindred was immediately accessible at all times. Dr. Nicole Kindred provided supervision to the technician on the date of this service, to the extent necessary to assure the quality of all services provided.    Zachary [name] will return in approximately one week for an interactive feedback session with Dr. Nicole Kindred, at which time test performance, clinical impressions, and treatment recommendations will be reviewed in detail. The patient understands he can contact our office should he require our assistance before this time.   A total of 105 minutes of billable time were spent with Zachary [name] by the technician, including test administration and scoring time. Billing for these services is reflected in Dr. Les Pou note.   This note reflects time spent with the psychometrician and does not include test scores, clinical history, or any interpretations made by Dr. Nicole Kindred. The full report will follow in a separate note.

## 2020-11-02 NOTE — Progress Notes (Signed)
[facility]  Patient Name: RHYATT MUSKA MRN: [id number] Date of Birth: 1951-01-02 Age: 69 y.o. Education: 12 years  Measurement properties of test scores: IQ, Index, and Standard Scores (SS): Mean = 100; Standard Deviation = 15 Scaled Scores (Ss): Mean = 10; Standard Deviation = 3 Z scores (Z): Mean = 0; Standard Deviation = 1 T scores (T); Mean = 50; Standard Deviation = 10  TEST SCORES:    Note: This summary of test scores accompanies the interpretive report and should not be interpreted by unqualified individuals or in isolation without reference to the report. Test scores are relative to age, gender, and educational history as available and appropriate.   Performance Validity        "A" Random Letter Test Raw  Descriptor      Errors 1 Within Expectation  The Dot Counting Test: 10 Within Expectation      Mental Status Screening     Total Score Descriptor  MoCA 20 MCI      Expected Functioning        Wide Range Achievement Test: Standard/Scaled Score Percentile      Word Reading 91 27      Reynolds Intellectual Screening Test Standard/T-score Percentile      Guess WUJW 11 91      Odd Item Out 50 50  RIST Index 103 58      Attention/Processing Speed        Neuropsychological Assessment Battery (Attention Module, Form 1): Scaled/T-score Percentile     Digits Forward 45 31     Digits Backwards 32 4      Repeatable Battery for the Assessment of Neuropsychological Status (Form A): Standard Score Percentile     Coding 10 50      Language        Neuropsychological Assessment Battery (Language Module, Form 1): T-score Percentile      Naming   (31) 57 75      Verbal Fluency:  T Score Percentile      Controlled Oral Word Association (F-A-S) 41 18      Semantic Fluency (Animals) 39 14      Memory:        Neuropsychological Assessment Battery (Memory Module, Form 1): T-score/Standard Score Percentile  Memory Index (MEM):  95 37      List Learning           List A Immediate Recall   (3 , 5 , 6) 39 14         List B Immediate Recall   (3) 46 34         List A Short Delayed Recall   (4) 40 16         List A Long Delayed Recall   (4) 42 21         List A Percent Retention   (100 %) --- 54         List A Long Delayed Yes/No Recognition Hits   (10) --- 31         List A Long Delayed Yes/No Recognition False Alarms   (4) --- 42         List A Recognition Discriminability Index --- 34      Shape Learning           Immediate Recognition   (7 , 6 , 5) 61 86         Delayed Recognition   (7) 62 88  Percent Retention   (140 %) --- 91         Delayed Forced-Choice Recognition Hits   (7) --- 31         Delayed Forced-Choice Recognition False Alarms   (0) --- 75         Delayed Forced-Choice Recognition Discriminability --- 58     Story Learning           Immediate Recall   (15, 32) 42 21         Delayed Recall   (23) 41 18         Percent Retention   (72 %) --- 18      Daily Living Memory            Immediate Recall   (18, 22) 51 54          Delayed Recall   (7, 8) 58 79          Percent Retention (100 %) --- 82          Recognition Hits   (9) --- 58      Repeatable Battery for the Assessment of Neuropsychological Status (Form A): Scaled Score Percentile         Figure Recall   (12) 9 37      Visuospatial/Constructional Functioning        Repeatable Battery for the Assessment of Neuropsychological Status (Form A): Standard/Scaled Score Percentile      Visuospatial/Constructional Index 112 79         Figure Copy   (18) 10 50         Judgment of Line Orientation   (20) --- >75      Executive Functioning        Modified [employer] Test (MWCST): Standard/T-Score Percentile      Number of Categories Correct 60 84      Number of Perseverative Errors 57 75      Number of Total Errors 64 92      Percent Perseverative Errors 47 38  Executive Function Composite 114 82          Trail Making  Test: T-Score Percentile      Part A 53 62      Part B 53 62      [place] Diagnostic Aphasia Exam: Raw Score Scaled Score      Complex Ideational Material 11 9      Clock Drawing Raw Score Descriptor      Command 7 Mild Impairment      Rating Scales        Clinical Dementia Rating Raw Score Descriptor      Sum of Boxes 1.5 Mild Cognitive Impairment      Global Score 0.5 MCI      Quick Dementia Rating System Raw Score Descriptor      Sum of Boxes 4 Very Mild Dementia      Total Score 6 Mild Dementia  Geriatric Depression Scale - Short Form 1 Negative   Javar Eshbach V. Nicole Kindred PsyD, [place] Clinical Neuropsychologist

## 2020-11-03 NOTE — Progress Notes (Signed)
[facility]  Patient Name: Zachary [name] MRN: [id number] Date of Birth: 1951/11/22 Age: 69 y.o. Education: 12 years  Clinical Impressions  Zachary [name] is a 69 y.o., right-hand dominant, married man with a history of HTN, HLD, OSA (on CPAP) and memory and thinking problems over the past 5 months. The patient's wife presented as a bit dramatic in her complaints and in general, characterized the issue as more severe than the patient, but he has noticed changes too. He has mainly absentmindedness such as forgetting to put the coffee pot under the coffee maker, taking a moment to think of things at work, and forgetting how to do crafts that he has done in the past in his wood shop. He is still able to do them so long as he takes notes. He does not seem to be materially impaired in his day-to-day activities and is still working (although his job is simple). He has an MRI of the brain that was essentially normal on my review, although there is some burden of leukoaraiosis, perhaps enough to contribute to but not cause a dementia level of function.   This is a essentially a normal neuropsychological study, with the caveat that Zachary [name] did have some marginal findings in terms of on a few tasks (digit repetition backward, semantic fluency, and perhaps also select measures of verbal learning0. He also had "mildly impaired" clock drawing but then very good scores on many other challenging measures of executive function. It is possible that these are picking up on the day-to-day issues that his wife notes, but overall his cognitive testing is not convincingly abnormal. His wife rated him as functioning at a mild dementia level, with moderate memory loss, which is certainly an overestimate and likely reflects her concern. He is objectively performing in the average range in aggregate on memory measures on a very challenging battery of tests.   Zachary [name] is  thus demonstrating essentially normal test performance albeit with some intrasubtest variability that may be picking up on the day-to-day issues that he notices. My sense is that his primary difficulties are related to executive control, which is nonspecific. It is possible that overfocus and anxiety about cognitive performance, unrealistic expectations (reinforced by the patient's wife), or cumulative effects of stress are contributing to his day-to-day problems. He may also be sensitive to normal age-related cognitive changes. Pain related to his headaches may also be a factor. I think he will benefit from reassurance, mindfulness activities, and learning compensatory strategies. Would also consider treating his headaches and/or referring to neurology for treatment if necessary. The level of suspicion for a neurodegenerative condition is low, in his case, but an incipient issue that is not yet neuropsychologically relevant is always a potential concern in a patient his age.   Diagnostic Impressions: Cognitive Disorder (reason for visit)   Test Findings  Test scores are summarized in additional documentation associated with this encounter. Test scores are relative to age, gender, and educational history as available and appropriate. There were no concerns about performance validity as all findings fell within normal expectations.   General Intellectual Functioning/Achievement:  Performance on single word reading was toward the low end of the average range. Performance on the RIST index was a bit better, in the middle of the average range, with comparable average range scores on the verbally and visually oriented subtest. Average was used as a basis of comparison for his cognitive test data.   Attention and Processing Efficiency: Performance  on indicators of attention was mixed with average digit repetition forward yet unusually low digit repetition backward. He did fairly well on serial subtractions of  7 on the MoCA with 4/5 correct.   Processing speed fell at a reasonable, average level on both timed number-symbol coding and on simple numeric sequencing.   Language: Performance was normal on language measures. Visual object confrontation was average. Generation of words in response to the letters F-A-S was low average and generation of words in response to the category prompt "animals" was low average.   Visuospatial Function: Performance was good on visuospatial tests with a high average score on the overall visuospatial/constructional index from the RBANS. Figure copy was average and judgment of angular line orientations was high average.   Learning and Memory: Performance on measures of learning and memory was average overall. There is some minor subtest variability although the number of low subtest scores is not abnormal for a patient of this ability level and he scored at a reasonable average level on the overall index. Good acquisition and retention of information across time were demonstrated. He did demonstrate generally better visuospatial than verbal performance which may be related to instrumentation or more likely preexisting strengths and weaknesses.   In the verbal realm, Zachary [name] scored in the low average range on immediate and delayed recall of a 12-item word list and short story. His discriminability for words from the list versus foils was average for target words from the word list. Memory for brief daily-living type information was average on immediate recall and high average on delayed recall. Recognition fell at an average level.   In the visual realm, performance was very good, with high average performance on immediate recognition when learning a series of designs that are difficult to verbally encode and comparable high average delayed recognition. Yes/no discriminability was good and fell at an average level. Delayed recall for a modestly complex geometric figure was  average.   Executive Functions: Performance on executive measures was generally good, with the exception of clock drawing, which fell at a mildly impaired level due to minor errors in spatial arrangement of the numbers and stimulus bound hand placement. Alternating sequencing of numbers and letters of the alphabet was average. Performance was average on the [employer] Composite of the [place] of words in response to the letters F-A-S was low average. Performance was average when reasoning with verbal information on the complex ideational material.   Rating Scale(s): Mr. Abdon screened negative for the presence of depression. His wife characterized him as functioning at a mild dementia level, which is certainly an overestimate and likely reflects her perception of his issues.   Viviano Simas Nicole Kindred PsyD, [place] Clinical Neuropsychologist

## 2020-11-08 ENCOUNTER — Other Ambulatory Visit: Payer: Self-pay

## 2020-11-08 ENCOUNTER — Encounter: Payer: Self-pay | Admitting: Counselor

## 2020-11-08 ENCOUNTER — Encounter: Payer: Medicare Other | Admitting: Counselor

## 2020-11-08 ENCOUNTER — Ambulatory Visit (INDEPENDENT_AMBULATORY_CARE_PROVIDER_SITE_OTHER): Payer: Medicare Other | Admitting: Counselor

## 2020-11-08 ENCOUNTER — Other Ambulatory Visit: Payer: Medicare Other

## 2020-11-08 DIAGNOSIS — F09 Unspecified mental disorder due to known physiological condition: Secondary | ICD-10-CM

## 2020-11-08 NOTE — Progress Notes (Signed)
NEUROPSYCHOLOGY FEEDBACK NOTE [facility]  Feedback Note: I met with Kathalene Frames Winner to review the findings resulting from his neuropsychological evaluation. Since the last appointment, he has been about the same. Time was spent reviewing the impressions and recommendations that are detailed in the evaluation report. We discussed impression of essentially normal cognitive performance, albeit with some scattered low scores that may be picking up on the issues he notices day-to-day. I also discussed realistic expectations and the insidious impact of overfocus on cognition with the patient and his wife. We reviewed his MRI, which  I took time to explain the findings and answer all the patient's questions. I encouraged Mr. Pavao to contact me should he have any further questions or if further follow up is desired.   Current Medications and Medical History   Current Outpatient Medications  Medication Sig Dispense Refill  . aspirin EC 81 MG EC tablet Take 81 mg by mouth daily.      Marland Kitchen atorvastatin (LIPITOR) 80 MG tablet Take 1 tablet (80 mg total) by mouth daily. 90 tablet 3  . EUTHYROX 100 MCG tablet Take 1 tablet by mouth once daily 90 tablet 0  . lisinopril (ZESTRIL) 5 MG tablet TAKE 1 TABLET BY MOUTH ONCE DAILY . 90 tablet 3  . loratadine (CLARITIN) 10 MG tablet Take 10 mg by mouth daily.      . Melatonin 5 MG CAPS Take 2 capsules by mouth daily.    Marland Kitchen omeprazole (PRILOSEC) 20 MG capsule Take 1 capsule (20 mg total) by mouth daily. 90 capsule 2  . predniSONE (DELTASONE) 20 MG tablet Take one tab by mouth twice daily for 4 days, then one daily for 3 days. Take with food. 11 tablet 0  . sodium bicarbonate 650 MG tablet Take 1 tablet by mouth once daily 90 tablet 0   No current facility-administered medications for this visit.    Patient Active Problem List   Diagnosis Date Noted  . Prediabetes 05/27/2018  . Stomatitis 04/17/2017  . Dyslipidemia (high LDL; low HDL) 08/15/2016  .  S/P arthroscopy of shoulder 05/31/2016  . History of prostate cancer 05/09/2016  . Complete rotator cuff tear of left shoulder 07/05/2015  . Tennis elbow 05/30/2014  . Rotator cuff (capsule) sprain 03/10/2014  . Right shoulder pain 02/11/2014  . Memory difficulty 12/18/2012  . Gynecomastia, male 07/30/2012  . OSA (obstructive sleep apnea) 04/23/2011  . EXTERNAL HEMORRHOIDS 02/12/2011  . Prostate cancer ([place]) 11/12/2010  . PLANTAR FASCIITIS 04/09/2010  . Hypothyroidism 12/08/2009  . NEOPLASM OF UNCERTAIN BEHAVIOR OF SKIN 03/10/2009  . OSTEOARTHRITIS 02/14/2009  . HEARING DEFICIT 02/08/2009  . Essential hypertension 02/08/2009  . Allergic rhinitis 02/08/2009  . GERD 02/08/2009  . PROTEINURIA 02/08/2009  . DIVERTICULITIS, HX OF 02/08/2009    Mental Status and Behavioral Observations  Jaymison Luber Chalfant presented on time to the present encounter and was alert and generally oriented. Speech was normal in rate, rhythm, volume, and prosody. Self-reported mood was "good" and affect was mainly euthymic. Thought process was logical and goal oriented and thought content was appropriate to the topics discussed. There were no safety concerns identified at today's encounter, such as thoughts of harming self or others.   Plan  Feedback provided regarding the patient's neuropsychological evaluation. He is performing essentially within normal limits on neuropsychological testing and presented as reassured regarding the findings. Hussein Macdougal Pajak was encouraged to contact me if any questions arise or if further follow up is desired.  Viviano Simas Nicole Kindred, PsyD, ABN Clinical Neuropsychologist  Service(s) Provided at This Encounter: 44 minutes [phone]; Conjoint therapy with patient present)

## 2020-11-08 NOTE — Patient Instructions (Signed)
We discussed your neuropsychological test performance, which was not convincingly abnormal. I counseled you on normal vs. Abnormal test scores and the fact that normal performance doesn't conclusively rule out the possibility of a subtle problem but it does provide strong evidence against an impression of any dementia. You did have a few scattered low scores that in the context of your clinical history make me think that executive control problems are the primary issue.   Executive control is a higher order cognitive ability involved in regulating other cognitive resources. Much like the conductor of an orchestra coordinates multiple instruments to make music, executive capacities coordinate other lower-order skills (e.g., movement, language, attention) to form complex human behaviors. Individuals with executive control problems are often capable of doing most of the things they did before they were having problems, but they may not do so as effortlessly, efficiently, and consistently. These difficulties often manifest as problems tracking information, multitasking, and paying attention. Executive control problems often result in cognitive inefficiency and can present as "memory problems," because they decrease encoding and spontaneous retrieval of information.   We discussed lifestyle changes and their role in maintaining cognitive and vascular health, including eating a heart healthy brain healthy diet, getting regular exercise, and the like.   Avoid overfocusing on cognitive performance. Memory and cognition are notoriously fallible and if you are looking for cognitive problems, you are bound to find them. Once someone gets worried about their memory and thinking, they may overfocus on how they are doing day-to-day, and then when normal day-to-day cognitive errors are made, this becomes a cause for more concern. This concern and anxiety then decreases focus from the task at hand, reducing concentration,  causing more cognitive problems, and creating a vicious cycle. Rather than critiquing your performance, I would encourage you to remain present minded and focus on the task at hand. Perhaps most importantly, have reasonable expectations for yourself.  Healthy people forget things, lose focus, and do not perform 100% correctly all the time. Some cognitive errors are normal and are not necessarily a sign that there is something wrong with your brain.

## 2020-11-14 ENCOUNTER — Other Ambulatory Visit: Payer: Self-pay

## 2020-11-14 ENCOUNTER — Emergency Department (INDEPENDENT_AMBULATORY_CARE_PROVIDER_SITE_OTHER)
Admission: EM | Admit: 2020-11-14 | Discharge: 2020-11-14 | Disposition: A | Discharge status: Home / Self Care | Payer: Medicare Other | Source: Home / Self Care

## 2020-11-14 ENCOUNTER — Emergency Department (INDEPENDENT_AMBULATORY_CARE_PROVIDER_SITE_OTHER): Payer: Medicare Other

## 2020-11-14 ENCOUNTER — Encounter: Payer: Self-pay | Admitting: Family Medicine

## 2020-11-14 DIAGNOSIS — M25551 Pain in right hip: Secondary | ICD-10-CM

## 2020-11-14 DIAGNOSIS — R52 Pain, unspecified: Secondary | ICD-10-CM

## 2020-11-14 DIAGNOSIS — W19XXXA Unspecified fall, initial encounter: Secondary | ICD-10-CM

## 2020-11-14 MED ORDER — ACETAMINOPHEN 325 MG PO TABS
650.0000 mg | ORAL_TABLET | ORAL | Status: AC
  Administered 2020-11-14: 650 mg via ORAL

## 2020-11-14 NOTE — ED Provider Notes (Signed)
[name]    CSN: [id number] Arrival date & time: 11/14/20  0759      History   Chief Complaint Chief Complaint  Patient presents with  . Hip Pain    Right    HPI Zachary [name] is a 69 y.o. male.   This is an established [facility] care who presents with right hip pain following a fall.  Patient's current medications include thyroid, lisinopril, and statin.  He carries a diagnosis of prostate cancer.  Most recent visits to his neurologist suggest that he has had concerns about a cognitive disorder.  Last night the patient responded to delivery at the door and fell landing on his right hip.  He has had pain since.  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  He is having discomfort when he bears weight on that right hip, although is not seen any bruising.  Wife comments that he left the hot water running, has made mistakes leaving the coffee maker on, and generally is not himself.  Patient has had ongoing problems with balance for the last several months.  They have not seen an ENT yet.  He did have an MRI with a neurology evaluation and it showed a "TIA".     Past Medical History:  Diagnosis Date  . Arthritis   . Articular cartilage disease    left shoulder  . Cancer ([place]) 2015   positive prostate cancer bx  . Diverticulitis   . Dyslipidemia (high LDL; low HDL) 08/15/2016  . GERD (gastroesophageal reflux disease)   . Hearing problem    hearing deficit  . Hemorrhoids   . Hypertension   . Hypothyroidism   . Sleep apnea    uses a cpap  . Wears glasses   . Wears hearing aid    both ears    Patient Active Problem List   Diagnosis Date Noted  . Prediabetes 05/27/2018  . Stomatitis 04/17/2017  . Dyslipidemia (high LDL; low HDL) 08/15/2016  . S/P arthroscopy of shoulder 05/31/2016  . History of prostate cancer 05/09/2016  . Complete rotator cuff tear of left shoulder 07/05/2015  . Tennis elbow  05/30/2014  . Rotator cuff (capsule) sprain 03/10/2014  . Right shoulder pain 02/11/2014  . Memory difficulty 12/18/2012  . Gynecomastia, male 07/30/2012  . OSA (obstructive sleep apnea) 04/23/2011  . EXTERNAL HEMORRHOIDS 02/12/2011  . Prostate cancer ([place]) 11/12/2010  . PLANTAR FASCIITIS 04/09/2010  . Hypothyroidism 12/08/2009  . NEOPLASM OF UNCERTAIN BEHAVIOR OF SKIN 03/10/2009  . OSTEOARTHRITIS 02/14/2009  . HEARING DEFICIT 02/08/2009  . Essential hypertension 02/08/2009  . Allergic rhinitis 02/08/2009  . GERD 02/08/2009  . PROTEINURIA 02/08/2009  . DIVERTICULITIS, HX OF 02/08/2009    Past Surgical History:  Procedure Laterality Date  . BACK SURGERY  1978   herniated disksurgery  . CARDIAC CATHETERIZATION  08/01/2010  . COLONOSCOPY    . EXAM UNDER ANESTHESIA WITH MANIPULATION OF SHOULDER Right 09/15/2014   Procedure: RIGHT SHOULDER MANIPULATION UNDER ANESTHESIA;  Surgeon: Ninetta Lights, MD;  Location: [facility];  Service: Orthopedics;  Laterality: Right;  . HEMORRHOID SURGERY  08/2006  . SHOULDER ARTHROSCOPY WITH DISTAL CLAVICLE RESECTION Left 07/07/2015   Procedure: SHOULDER ARTHROSCOPY WITH DISTAL CLAVICLE RESECTION;  Surgeon: Kathryne Hitch, MD;  Location: [facility];  Service: Orthopedics;  Laterality: Left;  . SHOULDER ARTHROSCOPY WITH ROTATOR CUFF REPAIR Left 12/07/2015   Procedure: LEFT SHOULDER ARTHROSCOPY DEBRIDEMENT,  WITH ROTATOR CUFF REPAIR;  Surgeon: Ninetta Lights, MD;  Location: [facility];  Service: Orthopedics;  Laterality: Left;  . SHOULDER ARTHROSCOPY WITH ROTATOR CUFF REPAIR AND SUBACROMIAL DECOMPRESSION Left 07/07/2015   Procedure: LEFT SHOULDER SCOPE DEBRIDEMENT, SUBACROMIAL DECOMPRESSION, DISTAL CLAVICULECTOMY, ROTATOR CUFF REPAIR  ;  Surgeon: Kathryne Hitch, MD;  Location: [facility];  Service: Orthopedics;  Laterality: Left;  ANESTHESIA: GENERAL, PRE/POST OP SCALENE  . SHOULDER ARTHROSCOPY WITH  SUBACROMIAL DECOMPRESSION, ROTATOR CUFF REPAIR AND BICEP TENDON REPAIR Right 03/10/2014   Procedure: RIGHT SHOULDER ARTHROSCOPY WITH SUBACROMIAL DECOMPRESSION, PARTIAL ACROMIOPLASTY WITH CORACOAROMIAL LABRUM DEBRIDEMENT RELEASE DISTAL CLAVICULECTOMY,  ROTATOR CUFF REPAIR AND EXTENSIVE DEBRIDEMENT;  Surgeon: Ninetta Lights, MD;  Location: [facility];  Service: Orthopedics;  Laterality: Right;  . TENDON REPAIR  June 06, 2011   right elbow, Dr. Percell Miller       Home Medications    Prior to Admission medications   Medication Sig Start Date End Date Taking? Authorizing Provider  aspirin EC 81 MG EC tablet Take 81 mg by mouth daily.     Yes [provider]  EUTHYROX 100 MCG tablet Take 1 tablet by mouth once daily 10/04/20  Yes Wendling, Crosby Oyster, DO  lisinopril (ZESTRIL) 5 MG tablet TAKE 1 TABLET BY MOUTH ONCE DAILY . 04/14/20  Yes [name], DO  loratadine (CLARITIN) 10 MG tablet Take 10 mg by mouth daily.     Yes [provider]  Melatonin 5 MG CAPS Take 2 capsules by mouth daily.   Yes [provider]  omeprazole (PRILOSEC) 20 MG capsule Take 1 capsule (20 mg total) by mouth daily. 04/14/20  Yes [name], DO  sodium bicarbonate 650 MG tablet Take 1 tablet by mouth once daily 10/25/20  Yes Wendling, Crosby Oyster, DO  atorvastatin (LIPITOR) 80 MG tablet Take 1 tablet (80 mg total) by mouth daily. 10/09/20   [name], DO  predniSONE (DELTASONE) 20 MG tablet Take one tab by mouth twice daily for 4 days, then one daily for 3 days. Take with food. 10/15/20   Kandra Nicolas, MD    Family History Family History  Problem Relation Age of Onset  . Heart disease Father   . Breast cancer Mother   . Cancer Brother   . Alcohol abuse Other   . Arthritis Other   . Cancer Other        Breast, Prostate  . Coronary artery disease Other   . Irritable bowel syndrome Other   . Cystic fibrosis Other   . Colon cancer Neg  Hx     Social History Social History   Tobacco Use  . Smoking status: Never Smoker  . Smokeless tobacco: Never Used  Substance Use Topics  . Alcohol use: No  . Drug use: No     Allergies   Oxycodone, Atorvastatin, and Simvastatin   Review of Systems Review of Systems   Physical Exam Triage Vital Signs ED Triage Vitals  Enc Vitals Group     BP      Pulse      Resp      Temp      Temp src      SpO2      Weight      Height      Head Circumference      Peak Flow      Pain Score      Pain Loc  Pain Edu?      Excl. in [place]?    Orthostatic VS for the past 24 hrs:  BP- Lying Pulse- Lying BP- Sitting Pulse- Sitting BP- Standing at 0 minutes Pulse- Standing at 0 minutes  11/14/20 0824 126/85 73 134/87 71 138/87 74    Updated Vital Signs BP 134/78 (BP Location: Left Arm)   Pulse 73   Temp 98.2 F (36.8 C) (Oral)   Resp 15   SpO2 98%    Physical Exam   UC Treatments / Results  Labs (all labs ordered are listed, but only abnormal results are displayed) Labs Reviewed - No data to display  EKG   Radiology No results found.  Procedures Procedures (including critical care time)  Medications Ordered in UC Medications  acetaminophen (TYLENOL) tablet 650 mg (650 mg Oral Given 11/14/20 0904)    Initial Impression / Assessment and Plan / UC Course  I have reviewed the triage vital signs and the nursing notes.  Pertinent labs & imaging results that were available during my care of the patient were reviewed by me and considered in my medical decision making (see chart for details).    Final Clinical Impressions(s) / UC Diagnoses   Final diagnoses:  Sharp pain  Right hip pain     Discharge Instructions     Your problem today is a contusion of the hip.  This will take a week to resolve.  I hear that your right hip has been bothering her for several weeks and you may have some arthritis there as well, although it is not seriously showing on the  x-ray.  I hear that you have a balance problem.  The MRI does show an area of the cerebellum that has been affected by a small TIA.  This means that you will have balance problems for the foreseeable future.  Doing regular physical exercise and stretches will help keep you from falling.  I here that you also have been forgetful lately.  Even though the neurologist does not feel you have a serious problem, I believe that your forgetfulness does need further attention.  Your primary care doctor has done a wonderful job of screening for commonly reversible cognitive problems.  Further investigation in my opinion is warranted and you can ask your primary care doctor about a referral to [facility] where cognitive problems are being more thoroughly investigated.  In the meantime you should do stretches and the exercises we discussed.    ED Prescriptions    None     I have reviewed the PDMP during this encounter.   Robyn Haber, MD 11/14/20 [phone]

## 2020-11-14 NOTE — ED Triage Notes (Addendum)
Pt fell last night - pt's wife found him right away on the pine straw outside - no LOC  Pain to right hip Pt has seen neuro for dementia work up  Pt has been having dizzy spells Per wife pt has had a TIA in the past  [employer] vaccine booster in September 2021

## 2020-11-14 NOTE — Discharge Instructions (Addendum)
Your problem today is a contusion of the hip.  This will take a week to resolve.  I hear that your right hip has been bothering her for several weeks and you may have some arthritis there as well, although it is not seriously showing on the x-ray.  I hear that you have a balance problem.  The MRI does show an area of the cerebellum that has been affected by a small TIA.  This means that you will have balance problems for the foreseeable future.  Doing regular physical exercise and stretches will help keep you from falling.  I here that you also have been forgetful lately.  Even though the neurologist does not feel you have a serious problem, I believe that your forgetfulness does need further attention.  Your primary care doctor has done a wonderful job of screening for commonly reversible cognitive problems.  Further investigation in my opinion is warranted and you can ask your primary care doctor about a referral to [facility] where cognitive problems are being more thoroughly investigated.  In the meantime you should do stretches and the exercises we discussed.

## 2020-11-20 ENCOUNTER — Other Ambulatory Visit: Payer: Self-pay

## 2020-11-20 ENCOUNTER — Encounter: Payer: Self-pay | Admitting: Family Medicine

## 2020-11-20 ENCOUNTER — Ambulatory Visit (INDEPENDENT_AMBULATORY_CARE_PROVIDER_SITE_OTHER): Payer: Medicare Other | Admitting: Family Medicine

## 2020-11-20 VITALS — BP 122/74 | HR 77 | Temp 98.3°F | Ht 68.0 in | Wt 201.0 lb

## 2020-11-20 DIAGNOSIS — R2689 Other abnormalities of gait and mobility: Secondary | ICD-10-CM | POA: Diagnosis not present

## 2020-11-20 DIAGNOSIS — M7631 Iliotibial band syndrome, right leg: Secondary | ICD-10-CM | POA: Diagnosis not present

## 2020-11-20 DIAGNOSIS — M25551 Pain in right hip: Secondary | ICD-10-CM

## 2020-11-20 MED ORDER — PRAVASTATIN SODIUM 20 MG PO TABS
20.0000 mg | ORAL_TABLET | Freq: Every day | ORAL | 2 refills | Status: DC
Start: 2020-11-20 — End: 2021-01-17

## 2020-11-20 MED ORDER — MELOXICAM 7.5 MG PO TABS: 7.5000 mg | ORAL_TABLET | Freq: Every day | ORAL | 0 refills | Status: DC

## 2020-11-20 NOTE — Patient Instructions (Signed)
Heat (pad or rice pillow in microwave) over affected area, 10-15 minutes twice daily.   Ice/cold pack over area for 10-15 min twice daily.  OK to take Tylenol 1000 mg (2 extra strength tabs) or 975 mg (3 regular strength tabs) every 6 hours as needed.  If you do not hear anything about your referral in the next 1-2 weeks, call our office and ask for an update.  No more NSAIDs while on the meloxicam.  Let us know if you need anything.  Iliotibial Band Syndrome Rehab It is normal to feel mild stretching, pulling, tightness, or discomfort as you do these exercises, but you should stop right away if you feel sudden pain or your pain gets worse.  Stretching and range of motion exercises These exercises warm up your muscles and joints and improve the movement and flexibility of your hip and pelvis. Exercise A: Quadriceps, prone    1. Lie on your abdomen on a firm surface, such as a bed or padded floor. 2. Bend your left / right knee and hold your ankle. If you cannot reach your ankle or pant leg, loop a belt around your foot and grab the belt instead. 3. Gently pull your heel toward your buttocks. Your knee should not slide out to the side. You should feel a stretch in the front of your thigh and knee. 4. Hold this position for 30 seconds. Repeat 2 times. Complete this stretch 3 times per week. Exercise B: Iliotibial band    1. Lie on your side with your left / right leg in the top position. 2. Bend both of your knees and grab your left / right ankle. Stretch out your bottom arm to help you balance. 3. Slowly bring your top knee back so your thigh goes behind your trunk. 4. Slowly lower your top leg toward the floor until you feel a gentle stretch on the outside of your left / right hip and thigh. If you do not feel a stretch and your knee will not fall farther, place the heel of your other foot on top of your knee and pull your knee down toward the floor with your foot. 5. Hold this  position for 30 seconds. Repeat 2 times. Complete this stretch 3 times per week. Strengthening exercises These exercises build strength and endurance in your hip and pelvis. Endurance is the ability to use your muscles for a long time, even after they get tired. Exercise C: Straight leg raises (hip abductors)     1. Lie on your side with your left / right leg in the top position. Lie so your head, shoulder, knee, and hip line up. You may bend your bottom knee to help you balance. 2. Roll your hips slightly forward so your hips are stacked directly over each other and your left / right knee is facing forward. 3. Tense the muscles in your outer thigh and lift your top leg 4-6 inches (10-15 cm). 4. Hold this position for 3 seconds. Repeat for a total of 10 reps. 5. Slowly return to the starting position. Let your muscles relax completely before doing another repetition. Repeat 2 times. Complete this exercise 3 times per week. Exercise D: Straight leg raises (hip extensors) 1. Lie on your abdomen on your bed or a firm surface. You can put a pillow under your hips if that is more comfortable. 2. Bend your left / right knee so your foot is straight up in the air. 3. Squeeze your buttock muscles  and lift your left / right thigh off the bed. Do not let your back arch. 4. Tense this muscle as hard as you can without increasing any knee pain. 5. Hold this position for 2 seconds. Repeat for a total of 10 reps 6. Slowly lower your leg to the starting position and allow it to relax completely. Repeat 2 times. Complete this exercise 3 times per week. Exercise E: Hip hike 1. Stand sideways on a bottom step. Stand on your left / right leg with your other foot unsupported next to the step. You can hold onto the railing or wall if needed for balance. 2. Keep your knees straight and your torso square. Then, lift your left / right hip up toward the ceiling. 3. Slowly let your left / right hip lower toward the  floor, past the starting position. Your foot should get closer to the floor. Do not lean or bend your knees. Repeat 2 times. Complete this exercise 3 times per week.  Document Released: 12/09/2005 Document Revised: 08/13/2016 Document Reviewed: 11/10/2015 Elsevier Interactive Patient Education  2018 [place] [facility] It is normal to feel mild stretching, pulling, tightness, or discomfort as you do these exercises, but you should stop right away if you feel sudden pain or your pain gets worse.   Stretching and range of motion exercise This exercise warms up your muscles and joints and improves the movement and flexibility of your hip and pelvis. This exercise also helps to relieve pain and stiffness. Exercise A: Lunge (hip flexor stretch)     1. Kneel on the floor on your left / right knee. Bend your other knee so it is directly over your ankle. 2. Keep good posture with your head over your shoulders. Tuck your tailbone underneath you. This will prevent your back from arching too much. 3. You should feel a gentle stretch in the front of your thigh or hip. If you do not feel a stretch, slowly lunge forward with your chest up. 4. Hold this position for 30 seconds. 5. Slowly return to the starting position. Repeat 2 times. Complete this exercise 3 times per week. Strengthening exercises These exercises build strength and endurance in your hip and pelvis. Endurance is the ability to use your muscles for a long time, even after they get tired. Exercise B: Bridge (hip extensors)    1. Lie on your back on a firm surface with your knees bent and your feet flat on the floor. 2. Tighten your buttocks muscles and lift your bottom off the floor until the trunk of your body is level with your thighs. ? You should feel the muscles working in your buttocks and the back of your thighs. If this exercise is too easy, cross your arms over your chest or lift one leg while your  bottom is up off the floor. ? Do not arch your back. 3. Hold this position for 3 seconds. 4. Slowly lower your hips to the starting position. 5. Let your muscles relax completely between repetitions. Repeat 2 times. Complete this exercise 3 times per week. Exercise C: Straight leg raises (hip abductors)    1. Lie on your side with your left / right leg in the top position. Lie so your head, shoulder, knee, and hip line up. Bend your bottom knee to help you balance. 2. Lift your top leg up 4-6 inches (10-15 cm), keeping your toes pointed straight ahead. 3. Hold this position for 2 seconds.  4. Slowly lower your leg to the starting position and let your muscles relax completely. Repeat for a total of 10 repetitions. Repeat 2 times. Complete this exercise 3 times per week. Exercise D: Hip abductors and external rotators, quadruped 1. Get on your hands and knees on a firm, lightly padded surface. Your hands should be directly below your shoulders, and your knees should be directly below your hips. 2. Lift your left / right knee out to the side. Keep your knee bent. Do not twist your body. 3. Hold this position for 3 seconds. 4. Slowly lower your leg. Repeat for a total of 10 repetitions.  Repeat 2 times. Complete this exercise 3 times per week. Exercise E: Single leg stand 1. Stand near a counter or door frame to hold onto as needed. It is helpful to look in a mirror for this exercise so you can watch your hip. 2. Squeeze your left / right buttock muscles then lift up your other foot. Do not let your left / righthip push out to the side. 3. Hold this position for 3 seconds. Repeat for a total of 10 repetitions. Repeat 2 times. Complete this exercise 3 times per week. Make sure you discuss any questions you have with your health care provider. Document Released: 12/09/2005 Document Revised: 08/15/2016 Document Reviewed: 11/21/2015 Elsevier Interactive Patient Education  [employer].

## 2020-11-20 NOTE — Progress Notes (Signed)
Musculoskeletal Exam  Patient: Zachary [name] DOB: April 07, 1951  DOS: 11/20/2020  SUBJECTIVE:  Chief Complaint:   Chief Complaint  Patient presents with  . Fall    Zachary [name] is a 69 y.o.  male for evaluation and treatment of R hip pain.  He is here with his wife.  Onset:  6 weeks ago. No inj or change in activity.  Location: R outer hip radiating down R thigh Character:  aching and sharp  Progression of issue:  has worsened after a fall 1 week ago XR neg.  Associated symptoms: no bruising, redness, swelling; no back pain Treatment: to date has been rest, ice, acetaminophen, NSAIDs, home exercises and heat.   Neurovascular symptoms: +dizziness  He has been dealing with balance issues as well. MRI did show small infarcts in the R cerebellar region. [facility]ostatics were neg at [facility] last week.   Past Medical History:  Diagnosis Date  . Arthritis   . Articular cartilage disease    left shoulder  . Cancer ([place]) 2015   positive prostate cancer bx  . Diverticulitis   . Dyslipidemia (high LDL; low HDL) 08/15/2016  . GERD (gastroesophageal reflux disease)   . Hearing problem    hearing deficit  . Hemorrhoids   . Hypertension   . Hypothyroidism   . Sleep apnea    uses a cpap  . Wears glasses   . Wears hearing aid    both ears    Objective: VITAL SIGNS: BP 122/74 (BP Location: Left Arm, Patient Position: Sitting, Cuff Size: Normal)   Pulse 77   Temp 98.3 F (36.8 C) (Oral)   Ht 5\' 8"  (1.727 m)   Wt 201 lb (91.2 kg)   SpO2 96%   BMI 30.56 kg/m  Constitutional: Well formed, well developed. No acute distress. Thorax & Lungs: No accessory muscle use Musculoskeletal: R hip.   Normal active range of motion: yes.   Normal passive range of motion: yes Tenderness to palpation: yes over greater troch and distal glute med Pain w resisted hip abduction Deformity: no Ecchymosis: no Tests positive: Ober's Tests negative: Stinchfield, FABER, FADDIR, log roll Neurologic:  Normal sensory function. No focal deficits noted. DTR's equal and symmetric in LE's. No clonus. Psychiatric: Normal mood.  Assessment:  Balance problem - Plan: Ambulatory referral to Physical Therapy  Right hip pain - Plan: meloxicam (MOBIC) 7.5 MG tablet  Iliotibial band syndrome of right side - Plan: meloxicam (MOBIC) 7.5 MG tablet  Plan: Refer PT. [facility]ostatics neg at [facility]. Not on BP meds. Stays hydrated. Stretches/exercises for IT band and glute med, heat, ice, Tylenol.  F/u in 3-4 weeks if no better, could consider injection if thought to be more bursitis. The patient voiced understanding and agreement to the plan.   [place], DO 11/20/20  10:16 AM

## 2020-11-21 ENCOUNTER — Encounter: Payer: Medicare Other | Admitting: Counselor

## 2020-12-04 ENCOUNTER — Telehealth: Payer: Self-pay | Admitting: Family Medicine

## 2020-12-04 NOTE — Telephone Encounter (Signed)
I fixed the referral and tried to call pt but had to leave a voicemail, I put a note in the referral with info and phone number for him to call to get scheduled if he calls back and doesn't want to wait for them to call him

## 2020-12-04 NOTE — Telephone Encounter (Signed)
Patient is calling to check status of referral for PT , look closed to me but patient has not had any PT.

## 2020-12-07 ENCOUNTER — Ambulatory Visit: Payer: Medicare Other | Attending: Family Medicine | Admitting: Physical Therapy

## 2020-12-07 ENCOUNTER — Other Ambulatory Visit: Payer: Self-pay

## 2020-12-07 ENCOUNTER — Encounter: Payer: Self-pay | Admitting: Physical Therapy

## 2020-12-07 DIAGNOSIS — M6281 Muscle weakness (generalized): Secondary | ICD-10-CM

## 2020-12-07 DIAGNOSIS — R2681 Unsteadiness on feet: Secondary | ICD-10-CM

## 2020-12-07 DIAGNOSIS — R42 Dizziness and giddiness: Secondary | ICD-10-CM | POA: Diagnosis not present

## 2020-12-07 NOTE — Therapy (Signed)
[facility] [address]  [place], [place], [address] Phone: [phone]   Fax:  [phone]  Physical Therapy Evaluation  Patient Details  Name: Zachary [name] MRN: [id number] Date of Birth: 02/19/1951 Referring Provider (PT): Riki Sheer, [place]   Encounter Date: 12/07/2020   PT End of Session - 12/07/20 1618    Visit Number 1    Number of Visits 13    Date for PT Re-Evaluation 01/18/21    Authorization Type Medicare & [employer]    PT Start Time 6962    PT Stop Time 1532    PT Time Calculation (min) 39 min    Equipment Utilized During Treatment Gait belt    Activity Tolerance Patient tolerated treatment well    Behavior During Therapy [facility] for tasks assessed/performed           Past Medical History:  Diagnosis Date  . Arthritis   . Articular cartilage disease    left shoulder  . Cancer ([place]) 2015   positive prostate cancer bx  . Diverticulitis   . Dyslipidemia (high LDL; low HDL) 08/15/2016  . GERD (gastroesophageal reflux disease)   . Hearing problem    hearing deficit  . Hemorrhoids   . Hypertension   . Hypothyroidism   . Sleep apnea    uses a cpap  . Wears glasses   . Wears hearing aid    both ears    Past Surgical History:  Procedure Laterality Date  . BACK SURGERY  1978   herniated disksurgery  . CARDIAC CATHETERIZATION  08/01/2010  . COLONOSCOPY    . EXAM UNDER ANESTHESIA WITH MANIPULATION OF SHOULDER Right 09/15/2014   Procedure: RIGHT SHOULDER MANIPULATION UNDER ANESTHESIA;  Surgeon: Ninetta Lights, MD;  Location: [facility];  Service: Orthopedics;  Laterality: Right;  . HEMORRHOID SURGERY  08/2006  . SHOULDER ARTHROSCOPY WITH DISTAL CLAVICLE RESECTION Left 07/07/2015   Procedure: SHOULDER ARTHROSCOPY WITH DISTAL CLAVICLE RESECTION;  Surgeon: Kathryne Hitch, MD;  Location: [facility];  Service: Orthopedics;  Laterality: Left;  . SHOULDER  ARTHROSCOPY WITH ROTATOR CUFF REPAIR Left 12/07/2015   Procedure: LEFT SHOULDER ARTHROSCOPY DEBRIDEMENT, WITH ROTATOR CUFF REPAIR;  Surgeon: Ninetta Lights, MD;  Location: [facility];  Service: Orthopedics;  Laterality: Left;  . SHOULDER ARTHROSCOPY WITH ROTATOR CUFF REPAIR AND SUBACROMIAL DECOMPRESSION Left 07/07/2015   Procedure: LEFT SHOULDER SCOPE DEBRIDEMENT, SUBACROMIAL DECOMPRESSION, DISTAL CLAVICULECTOMY, ROTATOR CUFF REPAIR  ;  Surgeon: Kathryne Hitch, MD;  Location: [facility];  Service: Orthopedics;  Laterality: Left;  ANESTHESIA: GENERAL, PRE/POST OP SCALENE  . SHOULDER ARTHROSCOPY WITH SUBACROMIAL DECOMPRESSION, ROTATOR CUFF REPAIR AND BICEP TENDON REPAIR Right 03/10/2014   Procedure: RIGHT SHOULDER ARTHROSCOPY WITH SUBACROMIAL DECOMPRESSION, PARTIAL ACROMIOPLASTY WITH CORACOAROMIAL LABRUM DEBRIDEMENT RELEASE DISTAL CLAVICULECTOMY,  ROTATOR CUFF REPAIR AND EXTENSIVE DEBRIDEMENT;  Surgeon: Ninetta Lights, MD;  Location: [facility];  Service: Orthopedics;  Laterality: Right;  . TENDON REPAIR  June 06, 2011   right elbow, Dr. Percell Miller    There were no vitals filed for this visit.    Subjective Assessment - 12/07/20 1454    Subjective Patient reports that in October he had a bad dizzy spell, and after that he had trouble with balance. [place] about a month ago and a TIA was found on MRI. Still has random dizzy spells, imbalance, and memory issues. Dizziness occurs with supine>sit and STS- has tried to wait before standing  up to improve this some. Has been trying to walk with a SPC. Dizziness feels like "disoriented" and "feeling like you're going to faint" and lasts minutes. Denies head trauma when falling. Patient is using a SPC out in the community, furniture walking at home.    Patient is accompained by: Family member   wife   Pertinent History hypothyroidism, HTN, hearing problem, GERD, hx prostate CA, back surgery 1978, B RTC repair & R biceps  tendon repair    Limitations Sitting;Lifting;Standing;Walking;House hold activities    Diagnostic tests 10/07/20 brain MRI: Small remote lacunar infarct in the right cerebellar hemisphere. Mild chronic small vessel ischemia parenchymal volume loss.    Patient Stated Goals improve balance    Currently in Pain? No/denies              [facility] PT Assessment - 12/07/20 1459      Assessment   Medical Diagnosis Balance problem    Referring Provider (PT) Riki Sheer, DO    Onset Date/Surgical Date 10/07/20    Hand Dominance Right    Prior Therapy yes      Precautions   Precautions Fall      Balance Screen   Has the patient fallen in the past 6 months Yes    How many times? 4-6    Has the patient had a decrease in activity level because of a fear of falling?  No    Is the patient reluctant to leave their home because of a fear of falling?  No      Home Ecologist residence    Living Arrangements Spouse/significant other    Available Help at Discharge Family    Type of [place] to enter    Entrance Stairs-Number of Steps 1    Entrance Stairs-Rails None    Home Layout Two level    Alternate Level Stairs-Number of Steps 12-15    Alternate Level Stairs-Rails Right    [place] - single point      Prior Function   Level of Independence Independent    Vocation Retired    Leisure none      Cognition   Overall Cognitive Status Within Functional Limits for tasks assessed   slight difficulty with memory     Sensation   Light Touch Appears Intact      Coordination   Finger Nose Finger Test --   mild dysmetria on B hands     Posture/Postural Control   Posture/Postural Control Postural limitations    Postural Limitations Rounded Shoulders;Increased thoracic kyphosis      ROM / Strength   AROM / PROM / Strength AROM;Strength      AROM   AROM Assessment Site Ankle    Right/Left Ankle Right;Left    Right  Ankle Dorsiflexion 8    Left Ankle Dorsiflexion 8      Strength   Strength Assessment Site Knee;Hip;Ankle    Right/Left Hip Right;Left    Right Hip Flexion 4/5    Right Hip ABduction 4/5    Right Hip ADduction 4+/5    Left Hip ABduction 4+/5    Left Hip ADduction 4+/5    Right/Left Knee Right;Left    Right Knee Flexion 4/5    Right Knee Extension 4/5    Left Knee Flexion 4+/5    Left Knee Extension 4/5    Right/Left Ankle Right;Left    Right Ankle Dorsiflexion 4/5  Right Ankle Plantar Flexion 4+/5    Left Ankle Dorsiflexion 4+/5    Left Ankle Plantar Flexion 4+/5      Ambulation/Gait   Assistive device None    Gait Pattern Step-to pattern;Step-through pattern;Decreased dorsiflexion - right;Decreased dorsiflexion - left;Lateral trunk lean to right;Lateral trunk lean to left   slightly shuffling   Ambulation Surface Level;Indoor    Gait velocity [facility]      Balance   Balance Assessed Yes    Balance comment M-CTSIB: EO/firm- WNL, EC/firm- mild sway, EO/foam- mild sway, EC/foam- moderate sway and dizziness      Standardized Balance Assessment   Standardized Balance Assessment Dynamic Gait Index      Dynamic Gait Index   Level Surface Normal    Change in Gait Speed Normal    Gait with Horizontal Head Turns Mild Impairment    Gait with Vertical Head Turns Mild Impairment    Gait and Pivot Turn Moderate Impairment    Step Over Obstacle Normal    Step Around Obstacles Mild Impairment    Steps Mild Impairment    Total Score 18      Functional Gait  Assessment   Gait assessed  Yes                      Objective measurements completed on examination: See above findings.               PT Education - 12/07/20 1617    Education Details prognosis, POC, HEP    Person(s) Educated Patient    Methods Explanation;Demonstration;Tactile cues;Verbal cues;Handout    Comprehension Verbalized understanding            PT Short Term Goals - 12/07/20 1623       PT SHORT TERM GOAL #1   Title Patient to be independent with initial HEP.    Time 3    Period Weeks    Status New    Target Date 12/28/20             PT Long Term Goals - 12/07/20 1624      PT LONG TERM GOAL #1   Title Patient to be independent with advanced HEP.    Time 6    Period Weeks    Status New    Target Date 01/18/21      PT LONG TERM GOAL #2   Title Patient to demonstrate B LE strength >/=4+/5.    Time 6    Period Weeks    Status New    Target Date 01/18/21      PT LONG TERM GOAL #3   Title Patient to demonstrate mid sway with M-CTSIB condition EC/foam surface to improve ability to balance in dark/uneven environments.    Time 6    Period Weeks    Status New    Target Date 01/18/21      PT LONG TERM GOAL #4   Title patient to demo active L ankle ROM to [facility] to normalize ADLS    Time 6    Period Weeks    Status New    Target Date 01/18/21      PT LONG TERM GOAL #5   Title Patient to score >19 on DGI in order to decrease risk of falls.    Time 6    Period Weeks    Status New    Target Date 01/18/21  Plan - 12/07/20 1618    Clinical Impression Statement Patient is a 69 y/o M presenting to OPPT with c/o imbalance and dizziness since October 2021. MRI from October revealed infarcts in the R cerebellar region. Patient reports episodes of dizziness lasting minutes with bed mobility and transfers. Reports a fall about a month ago when he hurt his R hip, but denies head trauma. Patient currently ambulating with SPC outside and furniture-walking inside. Patient today presenting with slight B UE dysmetria, rounded shoulders and forward head posture, limited B ankle AROM, decreased R LE strength, imbalance on compliant surfaces and without visual fixation, and gait deviations. Patient's score on DGI indicates an increased risk of falls. Patient was educated on strengthening and balance HEP and instructed to perform at counter top for safety-  patient reported understanding. Plan for vestibular assessment next session. Would benefit from skilled PT services 2x/week for 6 weeks to address aforementioned impairments.    Personal Factors and Comorbidities Age;Comorbidity 3+;Fitness;Past/Current Experience;Time since onset of injury/illness/exacerbation    Comorbidities hypothyroidism, HTN, hearing problem, GERD, hx prostate CA, back surgery 1978, B RTC repair & R biceps tendon repair    Examination-Activity Limitations Bed Mobility;Bathing;Bend;Squat;Stairs;Stand;Carry;Toileting;Dressing;Transfers;Hygiene/Grooming;Lift;Locomotion Level;Reach Overhead    Examination-Participation Restrictions Church;Cleaning;Shop;Community Activity;Driving;Yard Work;Laundry;Meal Prep    Stability/Clinical Decision Making Stable/Uncomplicated    Clinical Decision Making Low    Rehab Potential Good    PT Frequency 2x / week    PT Duration 6 weeks    PT Treatment/Interventions ADLs/Self Care Home Management;Cryotherapy;Electrical Stimulation;Canalith Repostioning;Moist Heat;Balance training;Therapeutic exercise;Therapeutic activities;Functional mobility training;Stair training;Gait training;Ultrasound;Neuromuscular re-education;Patient/family education;Manual techniques;Vestibular;Taping;Energy conservation;Dry needling;Passive range of motion    PT Next Visit Plan vestibular assessment; R LE strengthening, static/dynamic balance    Consulted and Agree with Plan of Care Patient;Family member/caregiver    Family Member Consulted wife           Patient will benefit from skilled therapeutic intervention in order to improve the following deficits and impairments:  Abnormal gait,Decreased activity tolerance,Decreased strength,Pain,Decreased balance,Difficulty walking,Improper body mechanics,Decreased range of motion,Dizziness,Impaired flexibility,Postural dysfunction,Decreased coordination  Visit Diagnosis: Unsteadiness on feet  Dizziness and  giddiness  Muscle weakness (generalized)     Problem List Patient Active Problem List   Diagnosis Date Noted  . Prediabetes 05/27/2018  . Stomatitis 04/17/2017  . Dyslipidemia (high LDL; low HDL) 08/15/2016  . S/P arthroscopy of shoulder 05/31/2016  . History of prostate cancer 05/09/2016  . Complete rotator cuff tear of left shoulder 07/05/2015  . Tennis elbow 05/30/2014  . Rotator cuff (capsule) sprain 03/10/2014  . Right shoulder pain 02/11/2014  . Memory difficulty 12/18/2012  . Gynecomastia, male 07/30/2012  . OSA (obstructive sleep apnea) 04/23/2011  . EXTERNAL HEMORRHOIDS 02/12/2011  . Prostate cancer ([place]) 11/12/2010  . PLANTAR FASCIITIS 04/09/2010  . Hypothyroidism 12/08/2009  . NEOPLASM OF UNCERTAIN BEHAVIOR OF SKIN 03/10/2009  . OSTEOARTHRITIS 02/14/2009  . HEARING DEFICIT 02/08/2009  . Essential hypertension 02/08/2009  . Allergic rhinitis 02/08/2009  . GERD 02/08/2009  . PROTEINURIA 02/08/2009  . DIVERTICULITIS, HX OF 02/08/2009     Janene Harvey, PT, DPT 12/07/20 4:38 PM   [facility] [address]  [place], [place], [address] Phone: [phone]   Fax:  [phone]  Name: Zachary [name] MRN: [id number] Date of Birth: 1951/01/08

## 2020-12-12 ENCOUNTER — Ambulatory Visit: Payer: Medicare Other

## 2020-12-12 ENCOUNTER — Other Ambulatory Visit: Payer: Self-pay

## 2020-12-12 DIAGNOSIS — R42 Dizziness and giddiness: Secondary | ICD-10-CM | POA: Diagnosis not present

## 2020-12-12 DIAGNOSIS — M6281 Muscle weakness (generalized): Secondary | ICD-10-CM

## 2020-12-12 DIAGNOSIS — R2681 Unsteadiness on feet: Secondary | ICD-10-CM

## 2020-12-12 NOTE — Therapy (Signed)
Zachary [name]  [place], [place], [address] Phone: [phone]   Fax:  [phone]  Physical Therapy Treatment  Patient Details  Name: Zachary [name] MRN: [id number] Date of Birth: April 14, 1951 Referring Provider (PT): Riki Sheer, [place]   Encounter Date: 12/12/2020   PT End of Session - 12/12/20 4627    Visit Number 2    Number of Visits 13    Date for PT Re-Evaluation 01/18/21    Authorization Type Medicare & [employer]    PT Start Time [phone]    PT Stop Time 1014    PT Time Calculation (min) 40 min    Equipment Utilized During Treatment Gait belt    Activity Tolerance Patient tolerated treatment well    Behavior During Therapy [facility] for tasks assessed/performed           Past Medical History:  Diagnosis Date  . Arthritis   . Articular cartilage disease    left shoulder  . Cancer ([place]) 2015   positive prostate cancer bx  . Diverticulitis   . Dyslipidemia (high LDL; low HDL) 08/15/2016  . GERD (gastroesophageal reflux disease)   . Hearing problem    hearing deficit  . Hemorrhoids   . Hypertension   . Hypothyroidism   . Sleep apnea    uses a cpap  . Wears glasses   . Wears hearing aid    both ears    Past Surgical History:  Procedure Laterality Date  . BACK SURGERY  1978   herniated disksurgery  . CARDIAC CATHETERIZATION  08/01/2010  . COLONOSCOPY    . EXAM UNDER ANESTHESIA WITH MANIPULATION OF SHOULDER Right 09/15/2014   Procedure: RIGHT SHOULDER MANIPULATION UNDER ANESTHESIA;  Surgeon: Ninetta Lights, MD;  Location: [place];  Service: Orthopedics;  Laterality: Right;  . HEMORRHOID SURGERY  08/2006  . SHOULDER ARTHROSCOPY WITH DISTAL CLAVICLE RESECTION Left 07/07/2015   Procedure: SHOULDER ARTHROSCOPY WITH DISTAL CLAVICLE RESECTION;  Surgeon: Kathryne Hitch, MD;  Location: [place];  Service: Orthopedics;  Laterality: Left;  . SHOULDER ARTHROSCOPY  WITH ROTATOR CUFF REPAIR Left 12/07/2015   Procedure: LEFT SHOULDER ARTHROSCOPY DEBRIDEMENT, WITH ROTATOR CUFF REPAIR;  Surgeon: Ninetta Lights, MD;  Location: [place];  Service: Orthopedics;  Laterality: Left;  . SHOULDER ARTHROSCOPY WITH ROTATOR CUFF REPAIR AND SUBACROMIAL DECOMPRESSION Left 07/07/2015   Procedure: LEFT SHOULDER SCOPE DEBRIDEMENT, SUBACROMIAL DECOMPRESSION, DISTAL CLAVICULECTOMY, ROTATOR CUFF REPAIR  ;  Surgeon: Kathryne Hitch, MD;  Location: [place];  Service: Orthopedics;  Laterality: Left;  ANESTHESIA: GENERAL, PRE/POST OP SCALENE  . SHOULDER ARTHROSCOPY WITH SUBACROMIAL DECOMPRESSION, ROTATOR CUFF REPAIR AND BICEP TENDON REPAIR Right 03/10/2014   Procedure: RIGHT SHOULDER ARTHROSCOPY WITH SUBACROMIAL DECOMPRESSION, PARTIAL ACROMIOPLASTY WITH CORACOAROMIAL LABRUM DEBRIDEMENT RELEASE DISTAL CLAVICULECTOMY,  ROTATOR CUFF REPAIR AND EXTENSIVE DEBRIDEMENT;  Surgeon: Ninetta Lights, MD;  Location: [place];  Service: Orthopedics;  Laterality: Right;  . TENDON REPAIR  June 06, 2011   right elbow, Dr. Percell Miller    There were no vitals filed for this visit.   Subjective Assessment - 12/12/20 0937    Subjective Pt. noting no issues with HEP.    Patient is accompained by: Family member   wife   Pertinent History hypothyroidism, HTN, hearing problem, GERD, hx prostate CA, back surgery 1978, B RTC repair & R biceps tendon repair    Diagnostic tests 10/07/20 brain MRI: Small remote lacunar infarct  in the right cerebellar hemisphere. Mild chronic small vessel ischemia parenchymal volume loss.    Patient Stated Goals improve balance    Currently in Pain? No/denies    Multiple Pain Sites No                             [facility] Adult PT Treatment/Exercise - 12/12/20 0001      Neuro Re-ed    Neuro Re-ed Details  Tandem stance 2 x 20 sec; SLS 2 x 15 sec; Alternating cone knock over/righting x 10 cones; close therapist  supervision/ CGA at times as needed for saftey with gait belt      Knee/Hip Exercises: Stretches   Hip Flexor Stretch Right;2 reps;30 seconds    Hip Flexor Stretch Limitations seated    Gastroc Stretch Right;Left;2 reps;30 seconds    Gastroc Stretch Limitations standing leaning into counter      Knee/Hip Exercises: Aerobic   Nustep Lvl 5, 7 min (UE/LE)      Knee/Hip Exercises: Standing   Heel Raises Both;15 reps    Heel Raises Limitations at counter toe raise/heel raise    Hip Flexion Right;Left;10 reps;Knee bent    Hip Flexion Limitations march                    PT Short Term Goals - 12/12/20 3428      PT SHORT TERM GOAL #1   Title Patient to be independent with initial HEP.    Time 3    Period Weeks    Status On-going    Target Date 12/28/20             PT Long Term Goals - 12/12/20 0939      PT LONG TERM GOAL #1   Title Patient to be independent with advanced HEP.    Time 6    Period Weeks    Status On-going      PT LONG TERM GOAL #2   Title Patient to demonstrate B LE strength >/=4+/5.    Time 6    Period Weeks    Status On-going      PT LONG TERM GOAL #3   Title Patient to demonstrate mid sway with M-CTSIB condition EC/foam surface to improve ability to balance in dark/uneven environments.    Time 6    Period Weeks    Status On-going      PT LONG TERM GOAL #4   Title patient to demo active L ankle ROM to [facility] to normalize ADLS    Time 6    Period Weeks    Status On-going      PT LONG TERM GOAL #5   Title Patient to score >19 on DGI in order to decrease risk of falls.    Time 6    Period Weeks    Status On-going                 Plan - 12/12/20 0939    Clinical Impression Statement [name] doing ok.  Notes no issues with HEP.  Progressed LE strengthening and standing balance training focusing on LE clearance/dynamic SLS activities with Supervision provided throughout from therapist for safety.  Ended visit with pt. noting quad  muscle fatigue which subsided with rest.    Comorbidities hypothyroidism, HTN, hearing problem, GERD, hx prostate CA, back surgery 1978, B RTC repair & R biceps tendon repair    Rehab Potential Good  PT Frequency 2x / week    PT Duration 6 weeks    PT Treatment/Interventions ADLs/Self Care Home Management;Cryotherapy;Electrical Stimulation;Canalith Repostioning;Moist Heat;Balance training;Therapeutic exercise;Therapeutic activities;Functional mobility training;Stair training;Gait training;Ultrasound;Neuromuscular re-education;Patient/family education;Manual techniques;Vestibular;Taping;Energy conservation;Dry needling;Passive range of motion    PT Next Visit Plan vestibular assessment; R LE strengthening, static/dynamic balance    Consulted and Agree with Plan of Care Patient;Family member/caregiver    Family Member Consulted wife           Patient will benefit from skilled therapeutic intervention in order to improve the following deficits and impairments:  Abnormal gait,Decreased activity tolerance,Decreased strength,Pain,Decreased balance,Difficulty walking,Improper body mechanics,Decreased range of motion,Dizziness,Impaired flexibility,Postural dysfunction,Decreased coordination  Visit Diagnosis: Unsteadiness on feet  Dizziness and giddiness  Muscle weakness (generalized)     Problem List Patient Active Problem List   Diagnosis Date Noted  . Prediabetes 05/27/2018  . Stomatitis 04/17/2017  . Dyslipidemia (high LDL; low HDL) 08/15/2016  . S/P arthroscopy of shoulder 05/31/2016  . History of prostate cancer 05/09/2016  . Complete rotator cuff tear of left shoulder 07/05/2015  . Tennis elbow 05/30/2014  . Rotator cuff (capsule) sprain 03/10/2014  . Right shoulder pain 02/11/2014  . Memory difficulty 12/18/2012  . Gynecomastia, male 07/30/2012  . OSA (obstructive sleep apnea) 04/23/2011  . EXTERNAL HEMORRHOIDS 02/12/2011  . Prostate cancer ([place]) 11/12/2010  . PLANTAR  FASCIITIS 04/09/2010  . Hypothyroidism 12/08/2009  . NEOPLASM OF UNCERTAIN BEHAVIOR OF SKIN 03/10/2009  . OSTEOARTHRITIS 02/14/2009  . HEARING DEFICIT 02/08/2009  . Essential hypertension 02/08/2009  . Allergic rhinitis 02/08/2009  . GERD 02/08/2009  . PROTEINURIA 02/08/2009  . DIVERTICULITIS, HX OF 02/08/2009    Zachary [name], PTA 12/12/20 12:07 PM   [facility] [address]  [place], [place], [address] Phone: [phone]   Fax:  [phone]  Name: Zachary [name] MRN: [id number] Date of Birth: 12/10/1951

## 2020-12-14 ENCOUNTER — Other Ambulatory Visit: Payer: Self-pay

## 2020-12-14 ENCOUNTER — Ambulatory Visit: Payer: Medicare Other

## 2020-12-14 DIAGNOSIS — M6281 Muscle weakness (generalized): Secondary | ICD-10-CM | POA: Diagnosis not present

## 2020-12-14 DIAGNOSIS — R2681 Unsteadiness on feet: Secondary | ICD-10-CM | POA: Diagnosis not present

## 2020-12-14 DIAGNOSIS — R42 Dizziness and giddiness: Secondary | ICD-10-CM | POA: Diagnosis not present

## 2020-12-14 NOTE — Therapy (Signed)
[facility] [address]  [place], [place], [address] Phone: [phone]   Fax:  [phone]  Physical Therapy Treatment  Patient Details  Name: Zachary [name] MRN: [id number] Date of Birth: December 06, 1951 Referring Provider (PT): Riki Sheer, DO   Encounter Date: 12/14/2020   PT End of Session - 12/14/20 1320    Visit Number 3    Number of Visits 13    Date for PT Re-Evaluation 01/18/21    Authorization Type Medicare & [employer]    PT Start Time 1316    PT Stop Time 1400    PT Time Calculation (min) 44 min    Equipment Utilized During Treatment Gait belt    Activity Tolerance Patient tolerated treatment well    Behavior During Therapy WFL for tasks assessed/performed           Past Medical History:  Diagnosis Date  . Arthritis   . Articular cartilage disease    left shoulder  . Cancer ([place]) 2015   positive prostate cancer bx  . Diverticulitis   . Dyslipidemia (high LDL; low HDL) 08/15/2016  . GERD (gastroesophageal reflux disease)   . Hearing problem    hearing deficit  . Hemorrhoids   . Hypertension   . Hypothyroidism   . Sleep apnea    uses a cpap  . Wears glasses   . Wears hearing aid    both ears    Past Surgical History:  Procedure Laterality Date  . BACK SURGERY  1978   herniated disksurgery  . CARDIAC CATHETERIZATION  08/01/2010  . COLONOSCOPY    . EXAM UNDER ANESTHESIA WITH MANIPULATION OF SHOULDER Right 09/15/2014   Procedure: RIGHT SHOULDER MANIPULATION UNDER ANESTHESIA;  Surgeon: Ninetta Lights, MD;  Location: [place];  Service: Orthopedics;  Laterality: Right;  . HEMORRHOID SURGERY  08/2006  . SHOULDER ARTHROSCOPY WITH DISTAL CLAVICLE RESECTION Left 07/07/2015   Procedure: SHOULDER ARTHROSCOPY WITH DISTAL CLAVICLE RESECTION;  Surgeon: Kathryne Hitch, MD;  Location: [place];  Service: Orthopedics;  Laterality: Left;  . SHOULDER ARTHROSCOPY  WITH ROTATOR CUFF REPAIR Left 12/07/2015   Procedure: LEFT SHOULDER ARTHROSCOPY DEBRIDEMENT, WITH ROTATOR CUFF REPAIR;  Surgeon: Ninetta Lights, MD;  Location: [place];  Service: Orthopedics;  Laterality: Left;  . SHOULDER ARTHROSCOPY WITH ROTATOR CUFF REPAIR AND SUBACROMIAL DECOMPRESSION Left 07/07/2015   Procedure: LEFT SHOULDER SCOPE DEBRIDEMENT, SUBACROMIAL DECOMPRESSION, DISTAL CLAVICULECTOMY, ROTATOR CUFF REPAIR  ;  Surgeon: Kathryne Hitch, MD;  Location: [place];  Service: Orthopedics;  Laterality: Left;  ANESTHESIA: GENERAL, PRE/POST OP SCALENE  . SHOULDER ARTHROSCOPY WITH SUBACROMIAL DECOMPRESSION, ROTATOR CUFF REPAIR AND BICEP TENDON REPAIR Right 03/10/2014   Procedure: RIGHT SHOULDER ARTHROSCOPY WITH SUBACROMIAL DECOMPRESSION, PARTIAL ACROMIOPLASTY WITH CORACOAROMIAL LABRUM DEBRIDEMENT RELEASE DISTAL CLAVICULECTOMY,  ROTATOR CUFF REPAIR AND EXTENSIVE DEBRIDEMENT;  Surgeon: Ninetta Lights, MD;  Location: [place];  Service: Orthopedics;  Laterality: Right;  . TENDON REPAIR  June 06, 2011   right elbow, Dr. Percell Miller    There were no vitals filed for this visit.   Subjective Assessment - 12/14/20 1318    Subjective Pt. denying quetions regarding HEP.    Pertinent History hypothyroidism, HTN, hearing problem, GERD, hx prostate CA, back surgery 1978, B RTC repair & R biceps tendon repair    Diagnostic tests 10/07/20 brain MRI: Small remote lacunar infarct in the right cerebellar hemisphere. Mild chronic small vessel ischemia parenchymal volume  loss.    Patient Stated Goals improve balance    Currently in Pain? No/denies    Multiple Pain Sites No                             [facility] Adult PT Treatment/Exercise - 12/14/20 0001      Neuro Re-ed    Neuro Re-ed Details  at counter: tandem walk/forward backwards x 2 laps, B tandem stance balance 2 x 15 sec, B SLS 2 x 15 sec + hor head turns; forward toe walk, heel walk x 1;  corner balance: narrow stance 2 x 30 sec with head turns eyes closed; B staggered stance with head turns + eyes closed with chair in front 2 x 30 sec      Knee/Hip Exercises: Stretches   Gastroc Stretch Right;Left;2 reps;30 seconds    Gastroc Stretch Limitations standing leaning into counter      Knee/Hip Exercises: Aerobic   Nustep Lvl 5, 6 min (UE/LE)      Knee/Hip Exercises: Standing   Heel Raises Both;20 reps    Heel Raises Limitations at counter toe raise/heel raise      Knee/Hip Exercises: Seated   Sit to Sand 10 reps;2 sets;without UE support                  PT Education - 12/14/20 1517    Education Details HEP update    Person(s) Educated Patient    Methods Explanation;Demonstration;Handout    Comprehension Verbalized understanding;Returned demonstration;Verbal cues required            PT Short Term Goals - 12/12/20 0939      PT SHORT TERM GOAL #1   Title Patient to be independent with initial HEP.    Time 3    Period Weeks    Status On-going    Target Date 12/28/20             PT Long Term Goals - 12/12/20 0939      PT LONG TERM GOAL #1   Title Patient to be independent with advanced HEP.    Time 6    Period Weeks    Status On-going      PT LONG TERM GOAL #2   Title Patient to demonstrate B LE strength >/=4+/5.    Time 6    Period Weeks    Status On-going      PT LONG TERM GOAL #3   Title Patient to demonstrate mid sway with M-CTSIB condition EC/foam surface to improve ability to balance in dark/uneven environments.    Time 6    Period Weeks    Status On-going      PT LONG TERM GOAL #4   Title patient to demo active L ankle ROM to [facility] to normalize ADLS    Time 6    Period Weeks    Status On-going      PT LONG TERM GOAL #5   Title Patient to score >19 on DGI in order to decrease risk of falls.    Time 6    Period Weeks    Status On-going                 Plan - 12/14/20 1321    Clinical Impression Statement [name]  doing well and reports no questions regarding HEP and performing daily.  STG #1 met.  Progressed to staggered stance corner balance and backwards tandem walk for balance  training which challanged pt. requiring Supervision from therapist for safety.  Pt. does demo occaisional 6-12" stray from straight line walking during session and pt. does report that he is still occasionally having poor balance "spells" at home which have not recently resulted in falling.  Pt. will benefit from further skilled therapy to maximize functional balance and safety with mobility.    Comorbidities hypothyroidism, HTN, hearing problem, GERD, hx prostate CA, back surgery 1978, B RTC repair & R biceps tendon repair    Rehab Potential Good    PT Frequency 2x / week    PT Duration 6 weeks    PT Treatment/Interventions ADLs/Self Care Home Management;Cryotherapy;Electrical Stimulation;Canalith Repostioning;Moist Heat;Balance training;Therapeutic exercise;Therapeutic activities;Functional mobility training;Stair training;Gait training;Ultrasound;Neuromuscular re-education;Patient/family education;Manual techniques;Vestibular;Taping;Energy conservation;Dry needling;Passive range of motion    PT Next Visit Plan vestibular assessment; R LE strengthening, static/dynamic balance    Consulted and Agree with Plan of Care Patient;Family member/caregiver    Family Member Consulted wife           Patient will benefit from skilled therapeutic intervention in order to improve the following deficits and impairments:  Abnormal gait,Decreased activity tolerance,Decreased strength,Pain,Decreased balance,Difficulty walking,Improper body mechanics,Decreased range of motion,Dizziness,Impaired flexibility,Postural dysfunction,Decreased coordination  Visit Diagnosis: Unsteadiness on feet  Dizziness and giddiness  Muscle weakness (generalized)     Problem List Patient Active Problem List   Diagnosis Date Noted  . Prediabetes 05/27/2018   . Stomatitis 04/17/2017  . Dyslipidemia (high LDL; low HDL) 08/15/2016  . S/P arthroscopy of shoulder 05/31/2016  . History of prostate cancer 05/09/2016  . Complete rotator cuff tear of left shoulder 07/05/2015  . Tennis elbow 05/30/2014  . Rotator cuff (capsule) sprain 03/10/2014  . Right shoulder pain 02/11/2014  . Memory difficulty 12/18/2012  . Gynecomastia, male 07/30/2012  . OSA (obstructive sleep apnea) 04/23/2011  . EXTERNAL HEMORRHOIDS 02/12/2011  . Prostate cancer ([place]) 11/12/2010  . PLANTAR FASCIITIS 04/09/2010  . Hypothyroidism 12/08/2009  . NEOPLASM OF UNCERTAIN BEHAVIOR OF SKIN 03/10/2009  . OSTEOARTHRITIS 02/14/2009  . HEARING DEFICIT 02/08/2009  . Essential hypertension 02/08/2009  . Allergic rhinitis 02/08/2009  . GERD 02/08/2009  . PROTEINURIA 02/08/2009  . DIVERTICULITIS, HX OF 02/08/2009    Bess Harvest, PTA 12/14/20 3:24 PM   [facility] [address]  [place], [place], [address] Phone: [phone]   Fax:  [phone]  Name: BELEN ZWAHLEN MRN: [id number] Date of Birth: [birth year]

## 2020-12-18 ENCOUNTER — Ambulatory Visit: Payer: Medicare Other

## 2020-12-18 ENCOUNTER — Other Ambulatory Visit: Payer: Self-pay

## 2020-12-18 DIAGNOSIS — M6281 Muscle weakness (generalized): Secondary | ICD-10-CM | POA: Diagnosis not present

## 2020-12-18 DIAGNOSIS — R2681 Unsteadiness on feet: Secondary | ICD-10-CM | POA: Diagnosis not present

## 2020-12-18 DIAGNOSIS — R42 Dizziness and giddiness: Secondary | ICD-10-CM | POA: Diagnosis not present

## 2020-12-18 NOTE — Therapy (Signed)
[facility] [address]  [place], [place], [address] Phone: [phone]   Fax:  [phone]  Physical Therapy Treatment  Patient Details  Name: Zachary [name] MRN: [id number] Date of Birth: Nov 23, 1951 Referring Provider (PT): Riki Sheer, [place]   Encounter Date: 12/18/2020   PT End of Session - 12/18/20 0803    Visit Number 4    Number of Visits 13    Date for PT Re-Evaluation 01/18/21    Authorization Type Medicare & [employer]    PT Start Time [phone]    PT Stop Time [phone]    PT Time Calculation (min) 38 min    Equipment Utilized During Treatment Gait belt    Activity Tolerance Patient tolerated treatment well    Behavior During Therapy [facility] for tasks assessed/performed           Past Medical History:  Diagnosis Date  . Arthritis   . Articular cartilage disease    left shoulder  . Cancer ([place]) 2015   positive prostate cancer bx  . Diverticulitis   . Dyslipidemia (high LDL; low HDL) 08/15/2016  . GERD (gastroesophageal reflux disease)   . Hearing problem    hearing deficit  . Hemorrhoids   . Hypertension   . Hypothyroidism   . Sleep apnea    uses a cpap  . Wears glasses   . Wears hearing aid    both ears    Past Surgical History:  Procedure Laterality Date  . BACK SURGERY  1978   herniated disksurgery  . CARDIAC CATHETERIZATION  08/01/2010  . COLONOSCOPY    . EXAM UNDER ANESTHESIA WITH MANIPULATION OF SHOULDER Right 09/15/2014   Procedure: RIGHT SHOULDER MANIPULATION UNDER ANESTHESIA;  Surgeon: Ninetta Lights, MD;  Location: [facility];  Service: Orthopedics;  Laterality: Right;  . HEMORRHOID SURGERY  08/2006  . SHOULDER ARTHROSCOPY WITH DISTAL CLAVICLE RESECTION Left 07/07/2015   Procedure: SHOULDER ARTHROSCOPY WITH DISTAL CLAVICLE RESECTION;  Surgeon: Kathryne Hitch, MD;  Location: [facility];  Service: Orthopedics;  Laterality: Left;  . SHOULDER ARTHROSCOPY  WITH ROTATOR CUFF REPAIR Left 12/07/2015   Procedure: LEFT SHOULDER ARTHROSCOPY DEBRIDEMENT, WITH ROTATOR CUFF REPAIR;  Surgeon: Ninetta Lights, MD;  Location: [facility];  Service: Orthopedics;  Laterality: Left;  . SHOULDER ARTHROSCOPY WITH ROTATOR CUFF REPAIR AND SUBACROMIAL DECOMPRESSION Left 07/07/2015   Procedure: LEFT SHOULDER SCOPE DEBRIDEMENT, SUBACROMIAL DECOMPRESSION, DISTAL CLAVICULECTOMY, ROTATOR CUFF REPAIR  ;  Surgeon: Kathryne Hitch, MD;  Location: [place];  Service: Orthopedics;  Laterality: Left;  ANESTHESIA: GENERAL, PRE/POST OP SCALENE  . SHOULDER ARTHROSCOPY WITH SUBACROMIAL DECOMPRESSION, ROTATOR CUFF REPAIR AND BICEP TENDON REPAIR Right 03/10/2014   Procedure: RIGHT SHOULDER ARTHROSCOPY WITH SUBACROMIAL DECOMPRESSION, PARTIAL ACROMIOPLASTY WITH CORACOAROMIAL LABRUM DEBRIDEMENT RELEASE DISTAL CLAVICULECTOMY,  ROTATOR CUFF REPAIR AND EXTENSIVE DEBRIDEMENT;  Surgeon: Ninetta Lights, MD;  Location: [facility];  Service: Orthopedics;  Laterality: Right;  . TENDON REPAIR  June 06, 2011   right elbow, Dr. Percell Miller    There were no vitals filed for this visit.   Subjective Assessment - 12/18/20 0803    Subjective Pt. without new concerns today.    Pertinent History hypothyroidism, HTN, hearing problem, GERD, hx prostate CA, back surgery 1978, B RTC repair & R biceps tendon repair    Diagnostic tests 10/07/20 brain MRI: Small remote lacunar infarct in the right cerebellar hemisphere. Mild chronic small vessel ischemia parenchymal volume  loss.    Patient Stated Goals improve balance    Currently in Pain? No/denies    Multiple Pain Sites No                             [facility] Adult PT Treatment/Exercise - 12/18/20 0001      Neuro Re-ed    Neuro Re-ed Details  at counter: SLS 2 x 15 sec + diagonal head turns, B SLS 2 x 15 sec + hor head turns; forward toe walk, heel walk x 1; B narrow stance 2 x 30 sec on foam with chair  in front with corner balance      Knee/Hip Exercises: Stretches   Gastroc Stretch Right;Left;30 seconds;4 reps    Gastroc Stretch Limitations standing leaning into counter      Knee/Hip Exercises: Aerobic   Nustep Lvl 5, 6 min (UE/LE)      Knee/Hip Exercises: Standing   Heel Raises Both;20 reps    Heel Raises Limitations toe raise    Functional Squat 10 reps;3 seconds    Functional Squat Limitations to chair; cues for 3" hold    Other Standing Knee Exercises B side stepping with red TB at ankles 2 x 30 ft    Other Standing Knee Exercises B con/R 75% eccentric lowering x 10                    PT Short Term Goals - 12/18/20 0849      PT SHORT TERM GOAL #1   Title Patient to be independent with initial HEP.    Time 3    Period Weeks    Status Achieved    Target Date 12/28/20             PT Long Term Goals - 12/12/20 0939      PT LONG TERM GOAL #1   Title Patient to be independent with advanced HEP.    Time 6    Period Weeks    Status On-going      PT LONG TERM GOAL #2   Title Patient to demonstrate B LE strength >/=4+/5.    Time 6    Period Weeks    Status On-going      PT LONG TERM GOAL #3   Title Patient to demonstrate mid sway with M-CTSIB condition EC/foam surface to improve ability to balance in dark/uneven environments.    Time 6    Period Weeks    Status On-going      PT LONG TERM GOAL #4   Title patient to demo active L ankle ROM to [facility] to normalize ADLS    Time 6    Period Weeks    Status On-going      PT LONG TERM GOAL #5   Title Patient to score >19 on DGI in order to decrease risk of falls.    Time 6    Period Weeks    Status On-going                 Plan - 12/18/20 0804    Clinical Impression Statement Herrick doing well with no new complaints.  Notes he was able to perform tandem stance and SLS stance static balance HEP + head turns with good challenge.  Progressed static balance on foam today in narrow stance in corner for  safety with good challenge noted thus will continue compliant surface balance in future visits.  progressed  LE strengthening with B side stepping with red TB and squat for general LE strength with cueing for even weight bearing on B LE.  Ended visit pain free and without complaint.    Comorbidities hypothyroidism, HTN, hearing problem, GERD, hx prostate CA, back surgery 1978, B RTC repair & R biceps tendon repair    Rehab Potential Good    PT Frequency 2x / week    PT Duration 6 weeks    PT Treatment/Interventions ADLs/Self Care Home Management;Cryotherapy;Electrical Stimulation;Canalith Repostioning;Moist Heat;Balance training;Therapeutic exercise;Therapeutic activities;Functional mobility training;Stair training;Gait training;Ultrasound;Neuromuscular re-education;Patient/family education;Manual techniques;Vestibular;Taping;Energy conservation;Dry needling;Passive range of motion    PT Next Visit Plan vestibular assessment; R LE strengthening, static/dynamic balance    Consulted and Agree with Plan of Care Patient           Patient will benefit from skilled therapeutic intervention in order to improve the following deficits and impairments:  Abnormal gait,Decreased activity tolerance,Decreased strength,Pain,Decreased balance,Difficulty walking,Improper body mechanics,Decreased range of motion,Dizziness,Impaired flexibility,Postural dysfunction,Decreased coordination  Visit Diagnosis: Unsteadiness on feet  Dizziness and giddiness  Muscle weakness (generalized)     Problem List Patient Active Problem List   Diagnosis Date Noted  . Prediabetes 05/27/2018  . Stomatitis 04/17/2017  . Dyslipidemia (high LDL; low HDL) 08/15/2016  . S/P arthroscopy of shoulder 05/31/2016  . History of prostate cancer 05/09/2016  . Complete rotator cuff tear of left shoulder 07/05/2015  . Tennis elbow 05/30/2014  . Rotator cuff (capsule) sprain 03/10/2014  . Right shoulder pain 02/11/2014  . Memory  difficulty 12/18/2012  . Gynecomastia, male 07/30/2012  . OSA (obstructive sleep apnea) 04/23/2011  . EXTERNAL HEMORRHOIDS 02/12/2011  . Prostate cancer ([facility]) 11/12/2010  . PLANTAR FASCIITIS 04/09/2010  . Hypothyroidism 12/08/2009  . NEOPLASM OF UNCERTAIN BEHAVIOR OF SKIN 03/10/2009  . OSTEOARTHRITIS 02/14/2009  . HEARING DEFICIT 02/08/2009  . Essential hypertension 02/08/2009  . Allergic rhinitis 02/08/2009  . GERD 02/08/2009  . PROTEINURIA 02/08/2009  . DIVERTICULITIS, HX OF 02/08/2009    Kermit Balo, PTA 12/18/20 8:50 AM   [facility] [address]  [address] [place], [place], [address] Phone: [phone]   Fax:  [phone]  Name: CARLIE CORPUS MRN: [id number] Date of Birth: 1951-03-20

## 2020-12-20 ENCOUNTER — Ambulatory Visit: Payer: Medicare Other

## 2020-12-20 ENCOUNTER — Other Ambulatory Visit: Payer: Self-pay

## 2020-12-20 DIAGNOSIS — R2681 Unsteadiness on feet: Secondary | ICD-10-CM | POA: Diagnosis not present

## 2020-12-20 DIAGNOSIS — M6281 Muscle weakness (generalized): Secondary | ICD-10-CM | POA: Diagnosis not present

## 2020-12-20 DIAGNOSIS — R42 Dizziness and giddiness: Secondary | ICD-10-CM

## 2020-12-20 NOTE — Therapy (Addendum)
[facility] [address]  [place], [place], [address] Phone: [phone]   Fax:  [phone]  Physical Therapy Treatment  Patient Details  Name: IHAN MCWHIRTER MRN: [id number] Date of Birth: [birth year] Referring Provider (PT): Riki Sheer, [place]   Encounter Date: 12/20/2020   PT End of Session - 12/20/20 1626    Visit Number 5    Number of Visits 13    Date for PT Re-Evaluation 01/18/21    Authorization Type Medicare & [employer]    PT Start Time 1620    PT Stop Time 1703    PT Time Calculation (min) 43 min    Equipment Utilized During Treatment Gait belt    Activity Tolerance Patient tolerated treatment well    Behavior During Therapy [facility] for tasks assessed/performed           Past Medical History:  Diagnosis Date  . Arthritis   . Articular cartilage disease    left shoulder  . Cancer ([place]) 2015   positive prostate cancer bx  . Diverticulitis   . Dyslipidemia (high LDL; low HDL) 08/15/2016  . GERD (gastroesophageal reflux disease)   . Hearing problem    hearing deficit  . Hemorrhoids   . Hypertension   . Hypothyroidism   . Sleep apnea    uses a cpap  . Wears glasses   . Wears hearing aid    both ears    Past Surgical History:  Procedure Laterality Date  . BACK SURGERY  1978   herniated disksurgery  . CARDIAC CATHETERIZATION  08/01/2010  . COLONOSCOPY    . EXAM UNDER ANESTHESIA WITH MANIPULATION OF SHOULDER Right 09/15/2014   Procedure: RIGHT SHOULDER MANIPULATION UNDER ANESTHESIA;  Surgeon: Ninetta Lights, MD;  Location: [facility];  Service: Orthopedics;  Laterality: Right;  . HEMORRHOID SURGERY  08/2006  . SHOULDER ARTHROSCOPY WITH DISTAL CLAVICLE RESECTION Left 07/07/2015   Procedure: SHOULDER ARTHROSCOPY WITH DISTAL CLAVICLE RESECTION;  Surgeon: Kathryne Hitch, MD;  Location: [facility];  Service: Orthopedics;  Laterality: Left;  . SHOULDER ARTHROSCOPY  WITH ROTATOR CUFF REPAIR Left 12/07/2015   Procedure: LEFT SHOULDER ARTHROSCOPY DEBRIDEMENT, WITH ROTATOR CUFF REPAIR;  Surgeon: Ninetta Lights, MD;  Location: [facility];  Service: Orthopedics;  Laterality: Left;  . SHOULDER ARTHROSCOPY WITH ROTATOR CUFF REPAIR AND SUBACROMIAL DECOMPRESSION Left 07/07/2015   Procedure: LEFT SHOULDER SCOPE DEBRIDEMENT, SUBACROMIAL DECOMPRESSION, DISTAL CLAVICULECTOMY, ROTATOR CUFF REPAIR  ;  Surgeon: Kathryne Hitch, MD;  Location: [facility];  Service: Orthopedics;  Laterality: Left;  ANESTHESIA: GENERAL, PRE/POST OP SCALENE  . SHOULDER ARTHROSCOPY WITH SUBACROMIAL DECOMPRESSION, ROTATOR CUFF REPAIR AND BICEP TENDON REPAIR Right 03/10/2014   Procedure: RIGHT SHOULDER ARTHROSCOPY WITH SUBACROMIAL DECOMPRESSION, PARTIAL ACROMIOPLASTY WITH CORACOAROMIAL LABRUM DEBRIDEMENT RELEASE DISTAL CLAVICULECTOMY,  ROTATOR CUFF REPAIR AND EXTENSIVE DEBRIDEMENT;  Surgeon: Ninetta Lights, MD;  Location: [facility];  Service: Orthopedics;  Laterality: Right;  . TENDON REPAIR  June 06, 2011   right elbow, Dr. Percell Miller    There were no vitals filed for this visit.   Subjective Assessment - 12/20/20 1654    Subjective Pt. without new complaints.    Pertinent History hypothyroidism, HTN, hearing problem, GERD, hx prostate CA, back surgery 1978, B RTC repair & R biceps tendon repair    Diagnostic tests 10/07/20 brain MRI: Small remote lacunar infarct in the right cerebellar hemisphere. Mild chronic small vessel ischemia parenchymal volume loss.  Patient Stated Goals improve balance    Currently in Pain? No/denies    Multiple Pain Sites No                             [facility] Adult PT Treatment/Exercise - 12/20/20 0001      Neuro Re-ed    Neuro Re-ed Details  Alternating high knee march on airex pad;  B SLS 3 x 15 sec on airex pad + head turns      Knee/Hip Exercises: Stretches   Chief Executive Officer Limitations standing leaning into counter      Knee/Hip Exercises: Aerobic   Nustep Lvl 5, 6 min (UE/LE)      Knee/Hip Exercises: Standing   Heel Raises Both;20 reps    Heel Raises Limitations Heel/toe raise; counter    Hip Flexion Right;Left;10 reps;Knee straight    Hip Flexion Limitations yellow looped TB at ankles    Hip Abduction Right;Left;10 reps;Knee straight    Abduction Limitations yellow looped TB at ankles    Hip Extension Right;Left;10 reps;Knee straight    Extension Limitations yellow looped TB at ankles      Knee/Hip Exercises: Sidelying   Hip ABduction Right;Left;10 reps;1 set    Hip ABduction Limitations cues for alignment    Clams B clam shell x 10 yellow TB                  PT Education - 12/20/20 1739    Education Details HEP update            PT Short Term Goals - 12/18/20 0849      PT SHORT TERM GOAL #1   Title Patient to be independent with initial HEP.    Time 3    Period Weeks    Status Achieved    Target Date 12/28/20             PT Long Term Goals - 12/12/20 0939      PT LONG TERM GOAL #1   Title Patient to be independent with advanced HEP.    Time 6    Period Weeks    Status On-going      PT LONG TERM GOAL #2   Title Patient to demonstrate B LE strength >/=4+/5.    Time 6    Period Weeks    Status On-going      PT LONG TERM GOAL #3   Title Patient to demonstrate mid sway with M-CTSIB condition EC/foam surface to improve ability to balance in dark/uneven environments.    Time 6    Period Weeks    Status On-going      PT LONG TERM GOAL #4   Title patient to demo active L ankle ROM to [facility] to normalize ADLS    Time 6    Period Weeks    Status On-going      PT LONG TERM GOAL #5   Title Patient to score >19 on DGI in order to decrease risk of falls.    Time 6    Period Weeks    Status On-going                 Plan - 12/20/20 1627    Clinical Impression Statement Treyden doing ok.   Progressed LE strengthening activities without pain.  Progressed SLS and static balance activities with supervision from therapist required for safety.  Reports  daily adherence to HEP.     Comorbidities hypothyroidism, HTN, hearing problem, GERD, hx prostate CA, back surgery 1978, B RTC repair & R biceps tendon repair    Rehab Potential Good    PT Frequency 2x / week    PT Duration 6 weeks    PT Treatment/Interventions ADLs/Self Care Home Management;Cryotherapy;Electrical Stimulation;Canalith Repostioning;Moist Heat;Balance training;Therapeutic exercise;Therapeutic activities;Functional mobility training;Stair training;Gait training;Ultrasound;Neuromuscular re-education;Patient/family education;Manual techniques;Vestibular;Taping;Energy conservation;Dry needling;Passive range of motion    PT Next Visit Plan vestibular assessment; R LE strengthening, static/dynamic balance    Consulted and Agree with Plan of Care Patient           Patient will benefit from skilled therapeutic intervention in order to improve the following deficits and impairments:  Abnormal gait,Decreased activity tolerance,Decreased strength,Pain,Decreased balance,Difficulty walking,Improper body mechanics,Decreased range of motion,Dizziness,Impaired flexibility,Postural dysfunction,Decreased coordination  Visit Diagnosis: Unsteadiness on feet  Dizziness and giddiness  Muscle weakness (generalized)     Problem List Patient Active Problem List   Diagnosis Date Noted  . Prediabetes 05/27/2018  . Stomatitis 04/17/2017  . Dyslipidemia (high LDL; low HDL) 08/15/2016  . S/P arthroscopy of shoulder 05/31/2016  . History of prostate cancer 05/09/2016  . Complete rotator cuff tear of left shoulder 07/05/2015  . Tennis elbow 05/30/2014  . Rotator cuff (capsule) sprain 03/10/2014  . Right shoulder pain 02/11/2014  . Memory difficulty 12/18/2012  . Gynecomastia, male 07/30/2012  . OSA (obstructive sleep apnea) 04/23/2011   . EXTERNAL HEMORRHOIDS 02/12/2011  . Prostate cancer ([facility]) 11/12/2010  . PLANTAR FASCIITIS 04/09/2010  . Hypothyroidism 12/08/2009  . NEOPLASM OF UNCERTAIN BEHAVIOR OF SKIN 03/10/2009  . OSTEOARTHRITIS 02/14/2009  . HEARING DEFICIT 02/08/2009  . Essential hypertension 02/08/2009  . Allergic rhinitis 02/08/2009  . GERD 02/08/2009  . PROTEINURIA 02/08/2009  . DIVERTICULITIS, HX OF 02/08/2009    Kermit Balo, PTA 12/20/20 6:00 PM    [facility] [address]  [address] [place], [place], [address] Phone: [phone]   Fax:  [phone]  Name: JESSEJAMES STEELMAN MRN: [id number] Date of Birth: February 01, 1951

## 2020-12-26 ENCOUNTER — Ambulatory Visit: Payer: Medicare Other | Attending: Family Medicine | Admitting: Physical Therapy

## 2020-12-26 ENCOUNTER — Other Ambulatory Visit: Payer: Self-pay

## 2020-12-26 ENCOUNTER — Encounter: Payer: Self-pay | Admitting: Physical Therapy

## 2020-12-26 DIAGNOSIS — R42 Dizziness and giddiness: Secondary | ICD-10-CM | POA: Diagnosis not present

## 2020-12-26 DIAGNOSIS — R2681 Unsteadiness on feet: Secondary | ICD-10-CM | POA: Insufficient documentation

## 2020-12-26 DIAGNOSIS — M6281 Muscle weakness (generalized): Secondary | ICD-10-CM | POA: Diagnosis not present

## 2020-12-26 NOTE — Therapy (Signed)
[facility] [address]  [address] [place], [place], [address] Phone: [phone]   Fax:  [phone]  Physical Therapy Treatment  Patient Details  Name: Zachary [name] MRN: [id number] Date of Birth: 10/04/1951 Referring Provider (PT): Arva Chafe, DO   Encounter Date: 12/26/2020   PT End of Session - 12/26/20 0926    Visit Number 6    Number of Visits 13    Date for PT Re-Evaluation 01/18/21    Authorization Type Medicare & [employer]    PT Start Time [id number]    PT Stop Time 0925    PT Time Calculation (min) 50 min    Equipment Utilized During Treatment Gait belt    Activity Tolerance Patient tolerated treatment well    Behavior During Therapy [facility] for tasks assessed/performed           Past Medical History:  Diagnosis Date  . Arthritis   . Articular cartilage disease    left shoulder  . Cancer (HCC) 2015   positive prostate cancer bx  . Diverticulitis   . Dyslipidemia (high LDL; low HDL) 08/15/2016  . GERD (gastroesophageal reflux disease)   . Hearing problem    hearing deficit  . Hemorrhoids   . Hypertension   . Hypothyroidism   . Sleep apnea    uses a cpap  . Wears glasses   . Wears hearing aid    both ears    Past Surgical History:  Procedure Laterality Date  . BACK SURGERY  1978   herniated disksurgery  . CARDIAC CATHETERIZATION  08/01/2010  . COLONOSCOPY    . EXAM UNDER ANESTHESIA WITH MANIPULATION OF SHOULDER Right 09/15/2014   Procedure: RIGHT SHOULDER MANIPULATION UNDER ANESTHESIA;  Surgeon: Loreta Ave, MD;  Location: [facility];  Service: Orthopedics;  Laterality: Right;  . HEMORRHOID SURGERY  08/2006  . SHOULDER ARTHROSCOPY WITH DISTAL CLAVICLE RESECTION Left 07/07/2015   Procedure: SHOULDER ARTHROSCOPY WITH DISTAL CLAVICLE RESECTION;  Surgeon: Mckinley Jewel, MD;  Location: [facility];  Service: Orthopedics;  Laterality: Left;  . SHOULDER ARTHROSCOPY  WITH ROTATOR CUFF REPAIR Left 12/07/2015   Procedure: LEFT SHOULDER ARTHROSCOPY DEBRIDEMENT, WITH ROTATOR CUFF REPAIR;  Surgeon: Loreta Ave, MD;  Location: [facility];  Service: Orthopedics;  Laterality: Left;  . SHOULDER ARTHROSCOPY WITH ROTATOR CUFF REPAIR AND SUBACROMIAL DECOMPRESSION Left 07/07/2015   Procedure: LEFT SHOULDER SCOPE DEBRIDEMENT, SUBACROMIAL DECOMPRESSION, DISTAL CLAVICULECTOMY, ROTATOR CUFF REPAIR  ;  Surgeon: Mckinley Jewel, MD;  Location: [facility];  Service: Orthopedics;  Laterality: Left;  ANESTHESIA: GENERAL, PRE/POST OP SCALENE  . SHOULDER ARTHROSCOPY WITH SUBACROMIAL DECOMPRESSION, ROTATOR CUFF REPAIR AND BICEP TENDON REPAIR Right 03/10/2014   Procedure: RIGHT SHOULDER ARTHROSCOPY WITH SUBACROMIAL DECOMPRESSION, PARTIAL ACROMIOPLASTY WITH CORACOAROMIAL LABRUM DEBRIDEMENT RELEASE DISTAL CLAVICULECTOMY,  ROTATOR CUFF REPAIR AND EXTENSIVE DEBRIDEMENT;  Surgeon: Loreta Ave, MD;  Location: [facility];  Service: Orthopedics;  Laterality: Right;  . TENDON REPAIR  June 06, 2011   right elbow, Dr. Eulah Pont    There were no vitals filed for this visit.   Subjective Assessment - 12/26/20 0837    Subjective Has been doing well and reports compliance with HEP. Denies falls, but still noting episodes of dizziness when getting out of bed or out of a chair at varying severity.    Pertinent History hypothyroidism, HTN, hearing problem, GERD, hx prostate CA, back surgery 1978, B RTC repair & R biceps tendon  repair    Diagnostic tests 10/07/20 brain MRI: Small remote lacunar infarct in the right cerebellar hemisphere. Mild chronic small vessel ischemia parenchymal volume loss.    Patient Stated Goals improve balance    Currently in Pain? No/denies                   Vestibular Assessment - 12/26/20 0841      Symptom Behavior   Type of Dizziness  Spinning   "off balance"   Frequency of Dizziness 3-4x/week    Duration of  Dizziness --   seconds-minutes   Symptom Nature Motion provoked    Aggravating Factors Supine to sit;Sit to stand   walking   Relieving Factors Rest    Progression of Symptoms Better      Oculomotor Exam   Oculomotor Alignment Normal    Ocular ROM WNL    Spontaneous Absent    Gaze-induced  Absent    Head shaking Horizontal Absent   c/o mild dizziness   Smooth Pursuits Intact    Saccades Intact    Comment convergence intact      Vestibulo-Ocular Reflex   VOR 1 Head Only (x 1 viewing) --   horizontal- difficulty fixating w/ R head turn, no dizziness;vertical- c/o difficulty focusing; no dizziness   VOR Cancellation Normal   c/o dizziness     Positional Testing   Dix-Hallpike Dix-Hallpike Right;Dix-Hallpike Left    Sidelying Test Sidelying Right;Sidelying Left      Dix-Hallpike Right   Dix-Hallpike Right Symptoms No nystagmus      Dix-Hallpike Left   Dix-Hallpike Left Symptoms No nystagmus      Sidelying Right   Sidelying Right Symptoms No nystagmus   no dizziness     Sidelying Left   Sidelying Left Symptoms No nystagmus   mild dizziness                   [facility] Adult PT Treatment/Exercise - 12/26/20 0001      Neuro Re-ed    Neuro Re-ed Details  STS on foam 5x EO, 5x EC   moderate sway with EC     Knee/Hip Exercises: Aerobic   Nustep Lvl 6, 6 min (UE/LE)           Vestibular Treatment/Exercise - 12/26/20 0001      Vestibular Treatment/Exercise   Vestibular Treatment Provided Habituation;Gaze    Habituation Exercises Nestor Lewandowsky    Gaze Exercises X1 Viewing Horizontal;Eye/Head Exercise Horizontal;X1 Viewing Vertical      Nestor Lewandowsky   Number of Reps  5    Symptom Description  5x each side with EC   5-6/10 dizziness on 1st rep to L which improved with increased reps     X1 Viewing Horizontal   Foot Position standing shoulder width    Reps 10    Comments c/o difficulty      X1 Viewing Vertical   Foot Position standing shoulder width    Reps 10     Comments c/o difficulty      Eye/Head Exercise Horizontal   Foot Position sitting    Reps 10    Comments VOR cancellation   cues to increase speed to achieve 3-4/10 dizziness                PT Education - 12/26/20 0925    Education Details edu on basis of vestibular rehab and potential benefits; update to HEP; removed sitting LAQ and HS curl and romberg balance  Person(s) Educated Patient    Methods Explanation;Demonstration;Tactile cues;Verbal cues;Handout    Comprehension Returned demonstration;Verbalized understanding            PT Short Term Goals - 12/18/20 0849      PT SHORT TERM GOAL #1   Title Patient to be independent with initial HEP.    Time 3    Period Weeks    Status Achieved    Target Date 12/28/20             PT Long Term Goals - 12/12/20 0939      PT LONG TERM GOAL #1   Title Patient to be independent with advanced HEP.    Time 6    Period Weeks    Status On-going      PT LONG TERM GOAL #2   Title Patient to demonstrate B LE strength >/=4+/5.    Time 6    Period Weeks    Status On-going      PT LONG TERM GOAL #3   Title Patient to demonstrate mid sway with M-CTSIB condition EC/foam surface to improve ability to balance in dark/uneven environments.    Time 6    Period Weeks    Status On-going      PT LONG TERM GOAL #4   Title patient to demo active L ankle ROM to [facility] to normalize ADLS    Time 6    Period Weeks    Status On-going      PT LONG TERM GOAL #5   Title Patient to score >19 on DGI in order to decrease risk of falls.    Time 6    Period Weeks    Status On-going                 Plan - 12/26/20 [id number]    Clinical Impression Statement Patient arrived to session without new complaints. Still reporting intermittent episodes of dizziness when getting out of bed or out of a chair at varying severity. Assessed dizziness today, with patient reporting dizziness with R/L HIT, vertical and horizontal VOR, and VOR  convergence. Oculomotor exam otherwise normal. Positional testing revealed no nystagmus, however did report dizziness with Nestor Lewandowsky EC. Updated HEP with gaze fixation HEP as patient is displaying dizziness in line with motion sensitivity. Consolidated older HEP handouts to remove exercises that no longer challenge patient. Patient reported understanding of all edu provided today and without complaints at end of session.    Comorbidities hypothyroidism, HTN, hearing problem, GERD, hx prostate CA, back surgery 1978, B RTC repair & R biceps tendon repair    Rehab Potential Good    PT Frequency 2x / week    PT Duration 6 weeks    PT Treatment/Interventions ADLs/Self Care Home Management;Cryotherapy;Electrical Stimulation;Canalith Repostioning;Moist Heat;Balance training;Therapeutic exercise;Therapeutic activities;Functional mobility training;Stair training;Gait training;Ultrasound;Neuromuscular re-education;Patient/family education;Manual techniques;Vestibular;Taping;Energy conservation;Dry needling;Passive range of motion    PT Next Visit Plan motion sensitivity and VOR exercises, R LE strengthening, static/dynamic balance    Consulted and Agree with Plan of Care Patient           Patient will benefit from skilled therapeutic intervention in order to improve the following deficits and impairments:  Abnormal gait,Decreased activity tolerance,Decreased strength,Pain,Decreased balance,Difficulty walking,Improper body mechanics,Decreased range of motion,Dizziness,Impaired flexibility,Postural dysfunction,Decreased coordination  Visit Diagnosis: Unsteadiness on feet  Dizziness and giddiness  Muscle weakness (generalized)     Problem List Patient Active Problem List   Diagnosis Date Noted  . Prediabetes 05/27/2018  . Stomatitis 04/17/2017  .  Dyslipidemia (high LDL; low HDL) 08/15/2016  . S/P arthroscopy of shoulder 05/31/2016  . History of prostate cancer 05/09/2016  . Complete rotator  cuff tear of left shoulder 07/05/2015  . Tennis elbow 05/30/2014  . Rotator cuff (capsule) sprain 03/10/2014  . Right shoulder pain 02/11/2014  . Memory difficulty 12/18/2012  . Gynecomastia, male 07/30/2012  . OSA (obstructive sleep apnea) 04/23/2011  . EXTERNAL HEMORRHOIDS 02/12/2011  . Prostate cancer ([place]) 11/12/2010  . PLANTAR FASCIITIS 04/09/2010  . Hypothyroidism 12/08/2009  . NEOPLASM OF UNCERTAIN BEHAVIOR OF SKIN 03/10/2009  . OSTEOARTHRITIS 02/14/2009  . HEARING DEFICIT 02/08/2009  . Essential hypertension 02/08/2009  . Allergic rhinitis 02/08/2009  . GERD 02/08/2009  . PROTEINURIA 02/08/2009  . DIVERTICULITIS, HX OF 02/08/2009     Zachary [name], PT, DPT 12/26/20 9:33 AM   [facility] [address]  [place], [place], [address] Phone: [phone]   Fax:  [phone]  Name: Zachary [name] MRN: [id number] Date of Birth: 07-15-1951

## 2020-12-29 ENCOUNTER — Ambulatory Visit: Payer: Medicare Other

## 2020-12-29 ENCOUNTER — Other Ambulatory Visit: Payer: Self-pay

## 2020-12-29 DIAGNOSIS — R2681 Unsteadiness on feet: Secondary | ICD-10-CM

## 2020-12-29 DIAGNOSIS — M6281 Muscle weakness (generalized): Secondary | ICD-10-CM

## 2020-12-29 DIAGNOSIS — R42 Dizziness and giddiness: Secondary | ICD-10-CM

## 2020-12-29 NOTE — Therapy (Signed)
[facility] [address]  [place], [place], [address] Phone: [phone]   Fax:  [phone]  Physical Therapy Treatment  Patient Details  Name: Zachary [name] MRN: [id number] Date of Birth: Feb 18, 1951 Referring Provider (PT): Riki Sheer, DO   Encounter Date: 12/29/2020   PT End of Session - 12/29/20 0935    Visit Number 7    Number of Visits 13    Date for PT Re-Evaluation 01/18/21    Authorization Type Medicare & [employer]    PT Start Time 0930    PT Stop Time 1010    PT Time Calculation (min) 40 min    Equipment Utilized During Treatment --    Activity Tolerance Patient tolerated treatment well    Behavior During Therapy [facility] for tasks assessed/performed           Past Medical History:  Diagnosis Date  . Arthritis   . Articular cartilage disease    left shoulder  . Cancer ([place]) 2015   positive prostate cancer bx  . Diverticulitis   . Dyslipidemia (high LDL; low HDL) 08/15/2016  . GERD (gastroesophageal reflux disease)   . Hearing problem    hearing deficit  . Hemorrhoids   . Hypertension   . Hypothyroidism   . Sleep apnea    uses a cpap  . Wears glasses   . Wears hearing aid    both ears    Past Surgical History:  Procedure Laterality Date  . BACK SURGERY  1978   herniated disksurgery  . CARDIAC CATHETERIZATION  08/01/2010  . COLONOSCOPY    . EXAM UNDER ANESTHESIA WITH MANIPULATION OF SHOULDER Right 09/15/2014   Procedure: RIGHT SHOULDER MANIPULATION UNDER ANESTHESIA;  Surgeon: Ninetta Lights, MD;  Location: [place];  Service: Orthopedics;  Laterality: Right;  . HEMORRHOID SURGERY  08/2006  . SHOULDER ARTHROSCOPY WITH DISTAL CLAVICLE RESECTION Left 07/07/2015   Procedure: SHOULDER ARTHROSCOPY WITH DISTAL CLAVICLE RESECTION;  Surgeon: Kathryne Hitch, MD;  Location: [place];  Service: Orthopedics;  Laterality: Left;  . SHOULDER ARTHROSCOPY WITH  ROTATOR CUFF REPAIR Left 12/07/2015   Procedure: LEFT SHOULDER ARTHROSCOPY DEBRIDEMENT, WITH ROTATOR CUFF REPAIR;  Surgeon: Ninetta Lights, MD;  Location: [place];  Service: Orthopedics;  Laterality: Left;  . SHOULDER ARTHROSCOPY WITH ROTATOR CUFF REPAIR AND SUBACROMIAL DECOMPRESSION Left 07/07/2015   Procedure: LEFT SHOULDER SCOPE DEBRIDEMENT, SUBACROMIAL DECOMPRESSION, DISTAL CLAVICULECTOMY, ROTATOR CUFF REPAIR  ;  Surgeon: Kathryne Hitch, MD;  Location: [place];  Service: Orthopedics;  Laterality: Left;  ANESTHESIA: GENERAL, PRE/POST OP SCALENE  . SHOULDER ARTHROSCOPY WITH SUBACROMIAL DECOMPRESSION, ROTATOR CUFF REPAIR AND BICEP TENDON REPAIR Right 03/10/2014   Procedure: RIGHT SHOULDER ARTHROSCOPY WITH SUBACROMIAL DECOMPRESSION, PARTIAL ACROMIOPLASTY WITH CORACOAROMIAL LABRUM DEBRIDEMENT RELEASE DISTAL CLAVICULECTOMY,  ROTATOR CUFF REPAIR AND EXTENSIVE DEBRIDEMENT;  Surgeon: Ninetta Lights, MD;  Location: [facility];  Service: Orthopedics;  Laterality: Right;  . TENDON REPAIR  June 06, 2011   right elbow, Dr. Percell Miller    There were no vitals filed for this visit.   Subjective Assessment - 12/29/20 0934    Subjective pt. without new complaints.    Pertinent History hypothyroidism, HTN, hearing problem, GERD, hx prostate CA, back surgery 1978, B RTC repair & R biceps tendon repair    Diagnostic tests 10/07/20 brain MRI: Small remote lacunar infarct in the right cerebellar hemisphere. Mild chronic small vessel ischemia parenchymal volume loss.  Patient Stated Goals improve balance    Currently in Pain? No/denies    Multiple Pain Sites No                             [facility] Adult PT Treatment/Exercise - 12/29/20 0001      Neuro Re-ed    Neuro Re-ed Details  Tandem forward walk + head turns, up/down x 6 laps taking a sitting rest break x 2 for slight onset of dizziness      Knee/Hip Exercises: Stretches   Gastroc Stretch  Right;Left;30 seconds;2 reps    Gastroc Stretch Limitations standing leaning into counter      Knee/Hip Exercises: Aerobic   Recumbent Bike Lvl 5, 6 min      Knee/Hip Exercises: Standing   Heel Raises Both;20 reps    Heel Raises Limitations Hell/toe raise, counter    Forward Lunges Right;Left    Forward Lunges Limitations x 7 with UE support on counter and chair    Hip Abduction Right;Left;10 reps;Knee straight    Abduction Limitations red looped TB at ankles    Functional Squat 15 reps;3 seconds    Functional Squat Limitations red TB at knees                    PT Short Term Goals - 12/18/20 0849      PT SHORT TERM GOAL #1   Title Patient to be independent with initial HEP.    Time 3    Period Weeks    Status Achieved    Target Date 12/28/20             PT Long Term Goals - 12/12/20 0939      PT LONG TERM GOAL #1   Title Patient to be independent with advanced HEP.    Time 6    Period Weeks    Status On-going      PT LONG TERM GOAL #2   Title Patient to demonstrate B LE strength >/=4+/5.    Time 6    Period Weeks    Status On-going      PT LONG TERM GOAL #3   Title Patient to demonstrate mid sway with M-CTSIB condition EC/foam surface to improve ability to balance in dark/uneven environments.    Time 6    Period Weeks    Status On-going      PT LONG TERM GOAL #4   Title patient to demo active L ankle ROM to [facility] to normalize ADLS    Time 6    Period Weeks    Status On-going      PT LONG TERM GOAL #5   Title Patient to score >19 on DGI in order to decrease risk of falls.    Time 6    Period Weeks    Status On-going                 Plan - 12/29/20 0935    Clinical Impression Statement Zachary [name] doing well.  Notes he has been consistently performing VOR exercises from recent HEP update daily without issue and verbalized good understanding.  Progressed dynamic gait tasks today with supervision provided with tandem walk + head turns as pt.  experiencing some dizziness short-lasting relieved with sitting rest break.  Pt. ended visit without pain and seems to be tolerating strength and balance progression well.    Comorbidities hypothyroidism, HTN, hearing problem, GERD, hx prostate CA, back  surgery 1978, B RTC repair & R biceps tendon repair    Rehab Potential Good    PT Frequency 2x / week    PT Duration 6 weeks    PT Treatment/Interventions ADLs/Self Care Home Management;Cryotherapy;Electrical Stimulation;Canalith Repostioning;Moist Heat;Balance training;Therapeutic exercise;Therapeutic activities;Functional mobility training;Stair training;Gait training;Ultrasound;Neuromuscular re-education;Patient/family education;Manual techniques;Vestibular;Taping;Energy conservation;Dry needling;Passive range of motion    PT Next Visit Plan motion sensitivity and VOR exercises, R LE strengthening, static/dynamic balance    Consulted and Agree with Plan of Care Patient           Patient will benefit from skilled therapeutic intervention in order to improve the following deficits and impairments:  Abnormal gait,Decreased activity tolerance,Decreased strength,Pain,Decreased balance,Difficulty walking,Improper body mechanics,Decreased range of motion,Dizziness,Impaired flexibility,Postural dysfunction,Decreased coordination  Visit Diagnosis: Unsteadiness on feet  Dizziness and giddiness  Muscle weakness (generalized)     Problem List Patient Active Problem List   Diagnosis Date Noted  . Prediabetes 05/27/2018  . Stomatitis 04/17/2017  . Dyslipidemia (high LDL; low HDL) 08/15/2016  . S/P arthroscopy of shoulder 05/31/2016  . History of prostate cancer 05/09/2016  . Complete rotator cuff tear of left shoulder 07/05/2015  . Tennis elbow 05/30/2014  . Rotator cuff (capsule) sprain 03/10/2014  . Right shoulder pain 02/11/2014  . Memory difficulty 12/18/2012  . Gynecomastia, male 07/30/2012  . OSA (obstructive sleep apnea) 04/23/2011   . EXTERNAL HEMORRHOIDS 02/12/2011  . Prostate cancer ([place]) 11/12/2010  . PLANTAR FASCIITIS 04/09/2010  . Hypothyroidism 12/08/2009  . NEOPLASM OF UNCERTAIN BEHAVIOR OF SKIN 03/10/2009  . OSTEOARTHRITIS 02/14/2009  . HEARING DEFICIT 02/08/2009  . Essential hypertension 02/08/2009  . Allergic rhinitis 02/08/2009  . GERD 02/08/2009  . PROTEINURIA 02/08/2009  . DIVERTICULITIS, HX OF 02/08/2009    Bess Harvest, PTA 12/29/20 10:50 AM   [facility] [address]  [place], [place], [address] Phone: [phone]   Fax:  [phone]  Name: Zachary [name] MRN: [id number] Date of Birth: 26-Jun-1951

## 2021-01-02 ENCOUNTER — Other Ambulatory Visit: Payer: Self-pay

## 2021-01-02 ENCOUNTER — Ambulatory Visit: Payer: Medicare Other

## 2021-01-02 DIAGNOSIS — M6281 Muscle weakness (generalized): Secondary | ICD-10-CM

## 2021-01-02 DIAGNOSIS — R2681 Unsteadiness on feet: Secondary | ICD-10-CM

## 2021-01-02 DIAGNOSIS — R42 Dizziness and giddiness: Secondary | ICD-10-CM

## 2021-01-02 NOTE — Therapy (Signed)
[facility] [address]  [place], [place], [address] Phone: [phone]   Fax:  [phone]  Physical Therapy Treatment  Patient Details  Name: Zachary [name] MRN: [id number] Date of Birth: [birth year] Referring Provider (PT): Riki Sheer, [place]   Encounter Date: 01/02/2021   PT End of Session - 01/02/21 0924    Visit Number 8    Number of Visits 13    Date for PT Re-Evaluation 01/18/21    Authorization Type Medicare & [employer]    PT Start Time [phone]    PT Stop Time 0929    PT Time Calculation (min) 39 min    Activity Tolerance Patient tolerated treatment well    Behavior During Therapy [facility] for tasks assessed/performed           Past Medical History:  Diagnosis Date  . Arthritis   . Articular cartilage disease    left shoulder  . Cancer ([place]) 2015   positive prostate cancer bx  . Diverticulitis   . Dyslipidemia (high LDL; low HDL) 08/15/2016  . GERD (gastroesophageal reflux disease)   . Hearing problem    hearing deficit  . Hemorrhoids   . Hypertension   . Hypothyroidism   . Sleep apnea    uses a cpap  . Wears glasses   . Wears hearing aid    both ears    Past Surgical History:  Procedure Laterality Date  . BACK SURGERY  1978   herniated disksurgery  . CARDIAC CATHETERIZATION  08/01/2010  . COLONOSCOPY    . EXAM UNDER ANESTHESIA WITH MANIPULATION OF SHOULDER Right 09/15/2014   Procedure: RIGHT SHOULDER MANIPULATION UNDER ANESTHESIA;  Surgeon: Ninetta Lights, MD;  Location: [place];  Service: Orthopedics;  Laterality: Right;  . HEMORRHOID SURGERY  08/2006  . SHOULDER ARTHROSCOPY WITH DISTAL CLAVICLE RESECTION Left 07/07/2015   Procedure: SHOULDER ARTHROSCOPY WITH DISTAL CLAVICLE RESECTION;  Surgeon: Kathryne Hitch, MD;  Location: [place];  Service: Orthopedics;  Laterality: Left;  . SHOULDER ARTHROSCOPY WITH ROTATOR CUFF REPAIR Left 12/07/2015    Procedure: LEFT SHOULDER ARTHROSCOPY DEBRIDEMENT, WITH ROTATOR CUFF REPAIR;  Surgeon: Ninetta Lights, MD;  Location: [place];  Service: Orthopedics;  Laterality: Left;  . SHOULDER ARTHROSCOPY WITH ROTATOR CUFF REPAIR AND SUBACROMIAL DECOMPRESSION Left 07/07/2015   Procedure: LEFT SHOULDER SCOPE DEBRIDEMENT, SUBACROMIAL DECOMPRESSION, DISTAL CLAVICULECTOMY, ROTATOR CUFF REPAIR  ;  Surgeon: Kathryne Hitch, MD;  Location: [place];  Service: Orthopedics;  Laterality: Left;  ANESTHESIA: GENERAL, PRE/POST OP SCALENE  . SHOULDER ARTHROSCOPY WITH SUBACROMIAL DECOMPRESSION, ROTATOR CUFF REPAIR AND BICEP TENDON REPAIR Right 03/10/2014   Procedure: RIGHT SHOULDER ARTHROSCOPY WITH SUBACROMIAL DECOMPRESSION, PARTIAL ACROMIOPLASTY WITH CORACOAROMIAL LABRUM DEBRIDEMENT RELEASE DISTAL CLAVICULECTOMY,  ROTATOR CUFF REPAIR AND EXTENSIVE DEBRIDEMENT;  Surgeon: Ninetta Lights, MD;  Location: [place];  Service: Orthopedics;  Laterality: Right;  . TENDON REPAIR  June 06, 2011   right elbow, Dr. Percell Miller    There were no vitals filed for this visit.   Subjective Assessment - 01/02/21 0909    Subjective Pt. without new complaints.    Pertinent History hypothyroidism, HTN, hearing problem, GERD, hx prostate CA, back surgery 1978, B RTC repair & R biceps tendon repair    Diagnostic tests 10/07/20 brain MRI: Small remote lacunar infarct in the right cerebellar hemisphere. Mild chronic small vessel ischemia parenchymal volume loss.    Patient Stated Goals improve balance  Currently in Pain? No/denies    Multiple Pain Sites No                             [facility] Adult PT Treatment/Exercise - 01/02/21 0001      Neuro Re-ed    Neuro Re-ed Details  Alternating cone knock over/righting x 7; B SLS + horiz head turns at counter; B SLS on foam + horiz head turns at counter      Knee/Hip Exercises: Stretches   Sports administrator Limitations standing leaning into counter      Knee/Hip Exercises: Aerobic   Recumbent Bike Lvl 5, 8 min      Knee/Hip Exercises: Standing   Heel Raises Both;20 reps    Heel Raises Limitations Hell/toe raise, counter    Functional Squat 15 reps;3 seconds    Functional Squat Limitations to chair increased hold time    SLS with Vectors B SLS 2 x 20 sec + ball toss      Knee/Hip Exercises: Sidelying   Hip ABduction Right;Left;10 reps;Strengthening;2 sets    Hip ABduction Limitations cues for glute med bias                    PT Short Term Goals - 12/18/20 0849      PT SHORT TERM GOAL #1   Title Patient to be independent with initial HEP.    Time 3    Period Weeks    Status Achieved    Target Date 12/28/20             PT Long Term Goals - 12/12/20 0939      PT LONG TERM GOAL #1   Title Patient to be independent with advanced HEP.    Time 6    Period Weeks    Status On-going      PT LONG TERM GOAL #2   Title Patient to demonstrate B LE strength >/=4+/5.    Time 6    Period Weeks    Status On-going      PT LONG TERM GOAL #3   Title Patient to demonstrate mid sway with M-CTSIB condition EC/foam surface to improve ability to balance in dark/uneven environments.    Time 6    Period Weeks    Status On-going      PT LONG TERM GOAL #4   Title patient to demo active L ankle ROM to [facility] to normalize ADLS    Time 6    Period Weeks    Status On-going      PT LONG TERM GOAL #5   Title Patient to score >19 on DGI in order to decrease risk of falls.    Time 6    Period Weeks    Status On-going                 Plan - 01/02/21 0924    Clinical Impression Statement Nollie without new complaints.  Progressed standing balance and stabilization activities.  [name] with most difficulty maintaining balance with dynamic SLS tasks + head turns.  Denied onset of dizziness during session today and notes some improvement in this at home.  Close  therapist supervision provided throughout session for safety.    Comorbidities hypothyroidism, HTN, hearing problem, GERD, hx prostate CA, back surgery 1978, B RTC repair & R biceps tendon repair    Rehab Potential Good  PT Frequency 2x / week    PT Duration 6 weeks    PT Treatment/Interventions ADLs/Self Care Home Management;Cryotherapy;Electrical Stimulation;Canalith Repostioning;Moist Heat;Balance training;Therapeutic exercise;Therapeutic activities;Functional mobility training;Stair training;Gait training;Ultrasound;Neuromuscular re-education;Patient/family education;Manual techniques;Vestibular;Taping;Energy conservation;Dry needling;Passive range of motion    PT Next Visit Plan motion sensitivity and VOR exercises, R LE strengthening, static/dynamic balance    Consulted and Agree with Plan of Care Patient    Family Member Consulted wife           Patient will benefit from skilled therapeutic intervention in order to improve the following deficits and impairments:  Abnormal gait,Decreased activity tolerance,Decreased strength,Pain,Decreased balance,Difficulty walking,Improper body mechanics,Decreased range of motion,Dizziness,Impaired flexibility,Postural dysfunction,Decreased coordination  Visit Diagnosis: Unsteadiness on feet  Dizziness and giddiness  Muscle weakness (generalized)     Problem List Patient Active Problem List   Diagnosis Date Noted  . Prediabetes 05/27/2018  . Stomatitis 04/17/2017  . Dyslipidemia (high LDL; low HDL) 08/15/2016  . S/P arthroscopy of shoulder 05/31/2016  . History of prostate cancer 05/09/2016  . Complete rotator cuff tear of left shoulder 07/05/2015  . Tennis elbow 05/30/2014  . Rotator cuff (capsule) sprain 03/10/2014  . Right shoulder pain 02/11/2014  . Memory difficulty 12/18/2012  . Gynecomastia, male 07/30/2012  . OSA (obstructive sleep apnea) 04/23/2011  . EXTERNAL HEMORRHOIDS 02/12/2011  . Prostate cancer ([place]) 11/12/2010  .  PLANTAR FASCIITIS 04/09/2010  . Hypothyroidism 12/08/2009  . NEOPLASM OF UNCERTAIN BEHAVIOR OF SKIN 03/10/2009  . OSTEOARTHRITIS 02/14/2009  . HEARING DEFICIT 02/08/2009  . Essential hypertension 02/08/2009  . Allergic rhinitis 02/08/2009  . GERD 02/08/2009  . PROTEINURIA 02/08/2009  . DIVERTICULITIS, HX OF 02/08/2009    Bess Harvest, PTA 01/02/21 12:21 PM   [facility] [address]  [place], [place], [address] Phone: [phone]   Fax:  [phone]  Name: EDIZ NEYLON MRN: [id number] Date of Birth: June 10, 1951

## 2021-01-03 ENCOUNTER — Other Ambulatory Visit: Payer: Self-pay | Admitting: Family Medicine

## 2021-01-04 ENCOUNTER — Other Ambulatory Visit: Payer: Self-pay

## 2021-01-04 ENCOUNTER — Ambulatory Visit: Payer: Medicare Other

## 2021-01-04 DIAGNOSIS — R2681 Unsteadiness on feet: Secondary | ICD-10-CM | POA: Diagnosis not present

## 2021-01-04 DIAGNOSIS — R42 Dizziness and giddiness: Secondary | ICD-10-CM

## 2021-01-04 DIAGNOSIS — M6281 Muscle weakness (generalized): Secondary | ICD-10-CM

## 2021-01-04 NOTE — Therapy (Signed)
[facility] [address]  [place], [place], [address] Phone: [phone]   Fax:  [phone]  Physical Therapy Treatment  Patient Details  Name: Zachary [name] MRN: [id number] Date of Birth: 06-23-1951 Referring Provider (PT): Riki Sheer, [place]   Encounter Date: 01/04/2021   PT End of Session - 01/04/21 0806    Visit Number 9    Number of Visits 13    Date for PT Re-Evaluation 01/18/21    Authorization Type Medicare & [employer]    PT Start Time 0801    PT Stop Time [phone]    PT Time Calculation (min) 42 min    Activity Tolerance Patient tolerated treatment well    Behavior During Therapy [facility] for tasks assessed/performed           Past Medical History:  Diagnosis Date  . Arthritis   . Articular cartilage disease    left shoulder  . Cancer ([place]) 2015   positive prostate cancer bx  . Diverticulitis   . Dyslipidemia (high LDL; low HDL) 08/15/2016  . GERD (gastroesophageal reflux disease)   . Hearing problem    hearing deficit  . Hemorrhoids   . Hypertension   . Hypothyroidism   . Sleep apnea    uses a cpap  . Wears glasses   . Wears hearing aid    both ears    Past Surgical History:  Procedure Laterality Date  . BACK SURGERY  1978   herniated disksurgery  . CARDIAC CATHETERIZATION  08/01/2010  . COLONOSCOPY    . EXAM UNDER ANESTHESIA WITH MANIPULATION OF SHOULDER Right 09/15/2014   Procedure: RIGHT SHOULDER MANIPULATION UNDER ANESTHESIA;  Surgeon: Ninetta Lights, MD;  Location: [place];  Service: Orthopedics;  Laterality: Right;  . HEMORRHOID SURGERY  08/2006  . SHOULDER ARTHROSCOPY WITH DISTAL CLAVICLE RESECTION Left 07/07/2015   Procedure: SHOULDER ARTHROSCOPY WITH DISTAL CLAVICLE RESECTION;  Surgeon: Kathryne Hitch, MD;  Location: [place];  Service: Orthopedics;  Laterality: Left;  . SHOULDER ARTHROSCOPY WITH ROTATOR CUFF REPAIR Left 12/07/2015    Procedure: LEFT SHOULDER ARTHROSCOPY DEBRIDEMENT, WITH ROTATOR CUFF REPAIR;  Surgeon: Ninetta Lights, MD;  Location: [place];  Service: Orthopedics;  Laterality: Left;  . SHOULDER ARTHROSCOPY WITH ROTATOR CUFF REPAIR AND SUBACROMIAL DECOMPRESSION Left 07/07/2015   Procedure: LEFT SHOULDER SCOPE DEBRIDEMENT, SUBACROMIAL DECOMPRESSION, DISTAL CLAVICULECTOMY, ROTATOR CUFF REPAIR  ;  Surgeon: Kathryne Hitch, MD;  Location: [place];  Service: Orthopedics;  Laterality: Left;  ANESTHESIA: GENERAL, PRE/POST OP SCALENE  . SHOULDER ARTHROSCOPY WITH SUBACROMIAL DECOMPRESSION, ROTATOR CUFF REPAIR AND BICEP TENDON REPAIR Right 03/10/2014   Procedure: RIGHT SHOULDER ARTHROSCOPY WITH SUBACROMIAL DECOMPRESSION, PARTIAL ACROMIOPLASTY WITH CORACOAROMIAL LABRUM DEBRIDEMENT RELEASE DISTAL CLAVICULECTOMY,  ROTATOR CUFF REPAIR AND EXTENSIVE DEBRIDEMENT;  Surgeon: Ninetta Lights, MD;  Location: [place];  Service: Orthopedics;  Laterality: Right;  . TENDON REPAIR  June 06, 2011   right elbow, Dr. Percell Miller    There were no vitals filed for this visit.   Subjective Assessment - 01/04/21 0805    Subjective Pt. doing ok.    Pertinent History hypothyroidism, HTN, hearing problem, GERD, hx prostate CA, back surgery 1978, B RTC repair & R biceps tendon repair    Diagnostic tests 10/07/20 brain MRI: Small remote lacunar infarct in the right cerebellar hemisphere. Mild chronic small vessel ischemia parenchymal volume loss.    Patient Stated Goals improve balance  Currently in Pain? No/denies    Multiple Pain Sites No              [facility] PT Assessment - 01/04/21 0001      AROM   AROM Assessment Site Ankle    Right/Left Ankle Right;Left    Right Ankle Dorsiflexion 10    Left Ankle Dorsiflexion 16      Strength   Strength Assessment Site Knee;Hip;Ankle    Right/Left Hip Right;Left    Right Hip Flexion 4+/5    Right Hip ABduction 4+/5    Right Hip ADduction 4+/5     Left Hip Flexion 4+/5    Left Hip ABduction 4+/5    Left Hip ADduction 4+/5    Right/Left Knee Right;Left    Right Knee Flexion 4/5    Right Knee Extension 4+/5    Left Knee Flexion 4+/5    Left Knee Extension 4+/5    Right/Left Ankle Right;Left    Right Ankle Dorsiflexion 4+/5    Right Ankle Plantar Flexion 5/5    Left Ankle Dorsiflexion 4+/5    Left Ankle Plantar Flexion 4+/5                         [facility] Adult PT Treatment/Exercise - 01/04/21 0001      Knee/Hip Exercises: Stretches   Gastroc Stretch Right;Left;30 seconds;2 reps    Gastroc Stretch Limitations standing leaning into counter    Soleus Stretch Right;Left;1 rep;30 seconds    Soleus Stretch Limitations runners stretch      Knee/Hip Exercises: Aerobic   Recumbent Bike Lvl 5, 7 min      Knee/Hip Exercises: Machines for Strengthening   Cybex Knee Flexion B LEs: 35# x 2 x 15      Knee/Hip Exercises: Standing   Functional Squat 15 reps;5 seconds    Functional Squat Limitations at TM      Knee/Hip Exercises: Supine   Bridges with Clamshell Both;10 reps;Strengthening   red TB at knees                   PT Short Term Goals - 12/18/20 0849      PT SHORT TERM GOAL #1   Title Patient to be independent with initial HEP.    Time 3    Period Weeks    Status Achieved    Target Date 12/28/20             PT Long Term Goals - 01/04/21 0820      PT LONG TERM GOAL #1   Title Patient to be independent with advanced HEP.    Time 6    Period Weeks    Status Partially Met   met for current     PT LONG TERM GOAL #2   Title Patient to demonstrate B LE strength >/=4+/5.    Time 6    Period Weeks    Status Partially Met   01/04/21:  partially met as HS weakness     PT LONG TERM GOAL #3   Title Patient to demonstrate mid sway with M-CTSIB condition EC/foam surface to improve ability to balance in dark/uneven environments.    Time 6    Period Weeks    Status On-going      PT LONG TERM  GOAL #4   Title patient to demo active L ankle ROM to [facility] to normalize ADLS    Time 6    Period Weeks  Status Partially Met   01/04/21: notable improvement in L DF ROM to 16 dg     PT LONG TERM GOAL #5   Title Patient to score >19 on DGI in order to decrease risk of falls.    Time 6    Period Weeks    Status On-going                 Plan - 01/04/21 2122    Clinical Impression Statement Pt. reports he is still having difficulty with his balance walking in yard in the dark or low light.  MMT revealing improved LE strength with improvement in quad, hip abduction strength and some remaining knee flexion weakness.  Progressed to machine HS curl today along with bridge + toes off table to target HS weakness.  Progressing toward LTG #2.  Pt. demonstrating improved B ankle DF AROM most notable on L.  LTG #2 partially achieved.    Comorbidities hypothyroidism, HTN, hearing problem, GERD, hx prostate CA, back surgery 1978, B RTC repair & R biceps tendon repair    Rehab Potential Good    PT Frequency 2x / week    PT Duration 6 weeks    PT Treatment/Interventions ADLs/Self Care Home Management;Cryotherapy;Electrical Stimulation;Canalith Repostioning;Moist Heat;Balance training;Therapeutic exercise;Therapeutic activities;Functional mobility training;Stair training;Gait training;Ultrasound;Neuromuscular re-education;Patient/family education;Manual techniques;Vestibular;Taping;Energy conservation;Dry needling;Passive range of motion    PT Next Visit Plan motion sensitivity and VOR exercises, R LE strengthening, static/dynamic balance    Consulted and Agree with Plan of Care Patient           Patient will benefit from skilled therapeutic intervention in order to improve the following deficits and impairments:  Abnormal gait,Decreased activity tolerance,Decreased strength,Pain,Decreased balance,Difficulty walking,Improper body mechanics,Decreased range of motion,Dizziness,Impaired  flexibility,Postural dysfunction,Decreased coordination  Visit Diagnosis: Unsteadiness on feet  Dizziness and giddiness  Muscle weakness (generalized)     Problem List Patient Active Problem List   Diagnosis Date Noted  . Prediabetes 05/27/2018  . Stomatitis 04/17/2017  . Dyslipidemia (high LDL; low HDL) 08/15/2016  . S/P arthroscopy of shoulder 05/31/2016  . History of prostate cancer 05/09/2016  . Complete rotator cuff tear of left shoulder 07/05/2015  . Tennis elbow 05/30/2014  . Rotator cuff (capsule) sprain 03/10/2014  . Right shoulder pain 02/11/2014  . Memory difficulty 12/18/2012  . Gynecomastia, male 07/30/2012  . OSA (obstructive sleep apnea) 04/23/2011  . EXTERNAL HEMORRHOIDS 02/12/2011  . Prostate cancer ([place]) 11/12/2010  . PLANTAR FASCIITIS 04/09/2010  . Hypothyroidism 12/08/2009  . NEOPLASM OF UNCERTAIN BEHAVIOR OF SKIN 03/10/2009  . OSTEOARTHRITIS 02/14/2009  . HEARING DEFICIT 02/08/2009  . Essential hypertension 02/08/2009  . Allergic rhinitis 02/08/2009  . GERD 02/08/2009  . PROTEINURIA 02/08/2009  . DIVERTICULITIS, HX OF 02/08/2009    Bess Harvest, PTA 01/04/21 12:07 PM   [facility] [address]  [place], [place], [address] Phone: [phone]   Fax:  [phone]  Name: Zachary [name] MRN: [id number] Date of Birth: [birth year]

## 2021-01-09 ENCOUNTER — Encounter: Payer: Self-pay | Admitting: Physical Therapy

## 2021-01-09 ENCOUNTER — Other Ambulatory Visit: Payer: Self-pay

## 2021-01-09 ENCOUNTER — Ambulatory Visit: Payer: Medicare Other | Admitting: Physical Therapy

## 2021-01-09 DIAGNOSIS — R2681 Unsteadiness on feet: Secondary | ICD-10-CM

## 2021-01-09 DIAGNOSIS — R42 Dizziness and giddiness: Secondary | ICD-10-CM

## 2021-01-09 DIAGNOSIS — M6281 Muscle weakness (generalized): Secondary | ICD-10-CM

## 2021-01-09 NOTE — Therapy (Signed)
[facility] [address]  [place], [place], [address] Phone: [phone]   Fax:  [phone]  Physical Therapy Progress Note  Patient Details  Name: Zachary [name] MRN: [id number] Date of Birth: 1951-03-08 Referring Provider (PT): Riki Sheer, DO   Progress Note Reporting Period 12/07/20 to 01/09/21  See note below for Objective Data and Assessment of Progress/Goals.     Encounter Date: 01/09/2021   PT End of Session - 01/09/21 0931    Visit Number 10    Number of Visits 13    Date for PT Re-Evaluation 01/18/21    Authorization Type Medicare & [employer]    PT Start Time [phone]    PT Stop Time 0929    PT Time Calculation (min) 45 min    Equipment Utilized During Treatment Gait belt    Activity Tolerance Patient tolerated treatment well    Behavior During Therapy WFL for tasks assessed/performed           Past Medical History:  Diagnosis Date  . Arthritis   . Articular cartilage disease    left shoulder  . Cancer ([place]) 2015   positive prostate cancer bx  . Diverticulitis   . Dyslipidemia (high LDL; low HDL) 08/15/2016  . GERD (gastroesophageal reflux disease)   . Hearing problem    hearing deficit  . Hemorrhoids   . Hypertension   . Hypothyroidism   . Sleep apnea    uses a cpap  . Wears glasses   . Wears hearing aid    both ears    Past Surgical History:  Procedure Laterality Date  . BACK SURGERY  1978   herniated disksurgery  . CARDIAC CATHETERIZATION  08/01/2010  . COLONOSCOPY    . EXAM UNDER ANESTHESIA WITH MANIPULATION OF SHOULDER Right 09/15/2014   Procedure: RIGHT SHOULDER MANIPULATION UNDER ANESTHESIA;  Surgeon: Ninetta Lights, MD;  Location: [place];  Service: Orthopedics;  Laterality: Right;  . HEMORRHOID SURGERY  08/2006  . SHOULDER ARTHROSCOPY WITH DISTAL CLAVICLE RESECTION Left 07/07/2015   Procedure: SHOULDER ARTHROSCOPY WITH DISTAL CLAVICLE  RESECTION;  Surgeon: Kathryne Hitch, MD;  Location: [place];  Service: Orthopedics;  Laterality: Left;  . SHOULDER ARTHROSCOPY WITH ROTATOR CUFF REPAIR Left 12/07/2015   Procedure: LEFT SHOULDER ARTHROSCOPY DEBRIDEMENT, WITH ROTATOR CUFF REPAIR;  Surgeon: Ninetta Lights, MD;  Location: [place];  Service: Orthopedics;  Laterality: Left;  . SHOULDER ARTHROSCOPY WITH ROTATOR CUFF REPAIR AND SUBACROMIAL DECOMPRESSION Left 07/07/2015   Procedure: LEFT SHOULDER SCOPE DEBRIDEMENT, SUBACROMIAL DECOMPRESSION, DISTAL CLAVICULECTOMY, ROTATOR CUFF REPAIR  ;  Surgeon: Kathryne Hitch, MD;  Location: [place];  Service: Orthopedics;  Laterality: Left;  ANESTHESIA: GENERAL, PRE/POST OP SCALENE  . SHOULDER ARTHROSCOPY WITH SUBACROMIAL DECOMPRESSION, ROTATOR CUFF REPAIR AND BICEP TENDON REPAIR Right 03/10/2014   Procedure: RIGHT SHOULDER ARTHROSCOPY WITH SUBACROMIAL DECOMPRESSION, PARTIAL ACROMIOPLASTY WITH CORACOAROMIAL LABRUM DEBRIDEMENT RELEASE DISTAL CLAVICULECTOMY,  ROTATOR CUFF REPAIR AND EXTENSIVE DEBRIDEMENT;  Surgeon: Ninetta Lights, MD;  Location: [place];  Service: Orthopedics;  Laterality: Right;  . TENDON REPAIR  June 06, 2011   right elbow, Dr. Percell Miller    There were no vitals filed for this visit.   Subjective Assessment - 01/09/21 0846    Subjective Played in the snow with his grandkids. Did not seem to lose his balance on the ice. Reporting 40% improvement in dizziness and imbalance since starting therapy. Still feels that  he gets random dizzy spells but they are less severe and last shorter periods.    Pertinent History hypothyroidism, HTN, hearing problem, GERD, hx prostate CA, back surgery 1978, B RTC repair & R biceps tendon repair    Diagnostic tests 10/07/20 brain MRI: Small remote lacunar infarct in the right cerebellar hemisphere. Mild chronic small vessel ischemia parenchymal volume loss.    Patient Stated Goals improve  balance    Currently in Pain? No/denies              [facility] PT Assessment - 01/09/21 0001      Assessment   Medical Diagnosis Balance problem    Referring Provider (PT) Riki Sheer, DO    Onset Date/Surgical Date 10/07/20      AROM   Overall AROM Comments carried over from 01/04/21    Right Ankle Dorsiflexion 10    Left Ankle Dorsiflexion 16      Strength   Right Hip Flexion 4+/5    Right Hip ABduction 4+/5    Right Hip ADduction 4+/5    Left Hip Flexion 4+/5    Left Hip ABduction 4+/5    Left Hip ADduction 4+/5    Right Knee Flexion 4/5    Right Knee Extension 4+/5    Left Knee Flexion 4+/5    Left Knee Extension 4+/5    Right Ankle Dorsiflexion 4+/5    Right Ankle Plantar Flexion 5/5    Left Ankle Dorsiflexion 4+/5    Left Ankle Plantar Flexion 4+/5      Balance   Balance Assessed Yes      Static Standing Balance   Static Standing - Comment/# of Minutes M-CTSIB: EO/firm- WNL, EC/firm- mild sway, EO/foam- mild sway, EC/foam- mild/moderate sway and mild dizziness      Dynamic Gait Index   Level Surface Normal    Change in Gait Speed Normal    Gait with Horizontal Head Turns Normal    Gait with Vertical Head Turns Normal    Gait and Pivot Turn Normal    Step Over Obstacle Normal    Step Around Obstacles Normal    Steps Normal    Total Score 24                         [facility] Adult PT Treatment/Exercise - 01/09/21 0001      Knee/Hip Exercises: Aerobic   Recumbent Bike Lvl 5, 7 min           Treatment:  Romberg VOR cancellation x10- no dizziness   Romberg horizontal VOR x10- no dizziness   Romberg vertical VOR x10- no dizziness   R/L Nestor Lewandowsky EO x2, EC x2- 4/10 dizziness with EC  STS on foam 5x EO- good stability  STS on foam 5x EC- good stability, 2/10 dizziness  Forward/backward stepping with head nods to targets 10x each foot; 6/10 dizziness and min A to address LOB       PT Education - 01/09/21 0930     Education Details update/consolidation of HEP; discussion on objective/subjective progress and remaining impairments    Person(s) Educated Patient    Methods Explanation;Demonstration;Tactile cues;Verbal cues;Handout    Comprehension Returned demonstration;Verbalized understanding            PT Short Term Goals - 01/09/21 0941      PT SHORT TERM GOAL #1   Title Patient to be independent with initial HEP.    Time 3    Period Weeks  Status Achieved    Target Date 12/28/20             PT Long Term Goals - 01/09/21 0906      PT LONG TERM GOAL #1   Title Patient to be independent with advanced HEP.    Time 6    Period Weeks    Status Partially Met   met for current     PT LONG TERM GOAL #2   Title Patient to demonstrate B LE strength >/=4+/5.    Time 6    Period Weeks    Status Partially Met   01/04/21:  partially met as HS weakness     PT LONG TERM GOAL #3   Title Patient to demonstrate mid sway with M-CTSIB condition EC/foam surface to improve ability to balance in dark/uneven environments.    Time 6    Period Weeks    Status Partially Met   demonstrating mild/moderate sway     PT LONG TERM GOAL #4   Title patient to demo active L ankle ROM to [facility] to normalize ADLS    Time 6    Period Weeks    Status Partially Met   01/04/21: notable improvement in L DF ROM to 16 dg     PT LONG TERM GOAL #5   Title Patient to score >19 on DGI in order to decrease risk of falls.    Time 6    Period Weeks    Status Achieved                 Plan - 01/09/21 0931    Clinical Impression Statement Patient reporting no issues with LOB since recent snowstorm. Reporting 40% improvement in dizziness and imbalance since starting therapy. Still feels that he gets random dizzy spells but they are less severe and last shorter periods. Patient also reporting that he is no longer using his SPC d/t improved confidence in his balance. According to objective measurements taken last  session, indicate good improvement in ankle ROM and LE strength, with R HS strength still limiting. DGI was performed without errors, demonstrating decreased risk of falls. Worked on reviewing habituation and gaze stabilization exercises with minor cueing required for form. However, patient able to perform most previously symptomatic exercises without c/o dizziness today. Did demonstrate mild/moderate instability with high level balance exercises, with more practice required. Updated HEP with exercises that were well-tolerated today. Patient reported understanding and without complaints at end of session. Patient is demonstrating excellent progress with therapy thus far. Will likely wrap up within coming visits.    Comorbidities hypothyroidism, HTN, hearing problem, GERD, hx prostate CA, back surgery 1978, B RTC repair & R biceps tendon repair    Rehab Potential Good    PT Frequency 2x / week    PT Duration 6 weeks    PT Treatment/Interventions ADLs/Self Care Home Management;Cryotherapy;Electrical Stimulation;Canalith Repostioning;Moist Heat;Balance training;Therapeutic exercise;Therapeutic activities;Functional mobility training;Stair training;Gait training;Ultrasound;Neuromuscular re-education;Patient/family education;Manual techniques;Vestibular;Taping;Energy conservation;Dry needling;Passive range of motion    PT Next Visit Plan high level balance exercises, R HS strengthening, static/dynamic balance    Consulted and Agree with Plan of Care Patient           Patient will benefit from skilled therapeutic intervention in order to improve the following deficits and impairments:  Abnormal gait,Decreased activity tolerance,Decreased strength,Pain,Decreased balance,Difficulty walking,Improper body mechanics,Decreased range of motion,Dizziness,Impaired flexibility,Postural dysfunction,Decreased coordination  Visit Diagnosis: Unsteadiness on feet  Dizziness and giddiness  Muscle weakness  (generalized)  Problem List Patient Active Problem List   Diagnosis Date Noted  . Prediabetes 05/27/2018  . Stomatitis 04/17/2017  . Dyslipidemia (high LDL; low HDL) 08/15/2016  . S/P arthroscopy of shoulder 05/31/2016  . History of prostate cancer 05/09/2016  . Complete rotator cuff tear of left shoulder 07/05/2015  . Tennis elbow 05/30/2014  . Rotator cuff (capsule) sprain 03/10/2014  . Right shoulder pain 02/11/2014  . Memory difficulty 12/18/2012  . Gynecomastia, male 07/30/2012  . OSA (obstructive sleep apnea) 04/23/2011  . EXTERNAL HEMORRHOIDS 02/12/2011  . Prostate cancer ([place]) 11/12/2010  . PLANTAR FASCIITIS 04/09/2010  . Hypothyroidism 12/08/2009  . NEOPLASM OF UNCERTAIN BEHAVIOR OF SKIN 03/10/2009  . OSTEOARTHRITIS 02/14/2009  . HEARING DEFICIT 02/08/2009  . Essential hypertension 02/08/2009  . Allergic rhinitis 02/08/2009  . GERD 02/08/2009  . PROTEINURIA 02/08/2009  . DIVERTICULITIS, HX OF 02/08/2009     Janene Harvey, PT, DPT 01/09/21 9:44 AM   [facility] [address]  [place], [place], [address] Phone: [phone]   Fax:  [phone]  Name: Zachary [name] MRN: [id number] Date of Birth: 06/29/1951

## 2021-01-12 ENCOUNTER — Ambulatory Visit: Payer: Medicare Other

## 2021-01-12 ENCOUNTER — Other Ambulatory Visit: Payer: Self-pay

## 2021-01-12 DIAGNOSIS — R2681 Unsteadiness on feet: Secondary | ICD-10-CM | POA: Diagnosis not present

## 2021-01-12 DIAGNOSIS — R42 Dizziness and giddiness: Secondary | ICD-10-CM

## 2021-01-12 DIAGNOSIS — M6281 Muscle weakness (generalized): Secondary | ICD-10-CM

## 2021-01-12 NOTE — Therapy (Signed)
[facility] [address]  [place], [place], [address] Phone: [phone]   Fax:  [phone]  Physical Therapy Treatment  Patient Details  Name: Zachary [name] MRN: [id number] Date of Birth: Jun 22, 1951 Referring Provider (PT): Riki Sheer, [place]   Encounter Date: 01/12/2021   PT End of Session - 01/12/21 0934    Visit Number 11    Number of Visits 13    Date for PT Re-Evaluation 01/18/21    Authorization Type Medicare & [employer]    PT Start Time 0840    PT Stop Time 0925    PT Time Calculation (min) 45 min    Activity Tolerance Patient tolerated treatment well    Behavior During Therapy [facility] for tasks assessed/performed           Past Medical History:  Diagnosis Date  . Arthritis   . Articular cartilage disease    left shoulder  . Cancer ([place]) 2015   positive prostate cancer bx  . Diverticulitis   . Dyslipidemia (high LDL; low HDL) 08/15/2016  . GERD (gastroesophageal reflux disease)   . Hearing problem    hearing deficit  . Hemorrhoids   . Hypertension   . Hypothyroidism   . Sleep apnea    uses a cpap  . Wears glasses   . Wears hearing aid    both ears    Past Surgical History:  Procedure Laterality Date  . BACK SURGERY  1978   herniated disksurgery  . CARDIAC CATHETERIZATION  08/01/2010  . COLONOSCOPY    . EXAM UNDER ANESTHESIA WITH MANIPULATION OF SHOULDER Right 09/15/2014   Procedure: RIGHT SHOULDER MANIPULATION UNDER ANESTHESIA;  Surgeon: Ninetta Lights, MD;  Location: [place];  Service: Orthopedics;  Laterality: Right;  . HEMORRHOID SURGERY  08/2006  . SHOULDER ARTHROSCOPY WITH DISTAL CLAVICLE RESECTION Left 07/07/2015   Procedure: SHOULDER ARTHROSCOPY WITH DISTAL CLAVICLE RESECTION;  Surgeon: Kathryne Hitch, MD;  Location: [place];  Service: Orthopedics;  Laterality: Left;  . SHOULDER ARTHROSCOPY WITH ROTATOR CUFF REPAIR Left 12/07/2015    Procedure: LEFT SHOULDER ARTHROSCOPY DEBRIDEMENT, WITH ROTATOR CUFF REPAIR;  Surgeon: Ninetta Lights, MD;  Location: [place];  Service: Orthopedics;  Laterality: Left;  . SHOULDER ARTHROSCOPY WITH ROTATOR CUFF REPAIR AND SUBACROMIAL DECOMPRESSION Left 07/07/2015   Procedure: LEFT SHOULDER SCOPE DEBRIDEMENT, SUBACROMIAL DECOMPRESSION, DISTAL CLAVICULECTOMY, ROTATOR CUFF REPAIR  ;  Surgeon: Kathryne Hitch, MD;  Location: [place];  Service: Orthopedics;  Laterality: Left;  ANESTHESIA: GENERAL, PRE/POST OP SCALENE  . SHOULDER ARTHROSCOPY WITH SUBACROMIAL DECOMPRESSION, ROTATOR CUFF REPAIR AND BICEP TENDON REPAIR Right 03/10/2014   Procedure: RIGHT SHOULDER ARTHROSCOPY WITH SUBACROMIAL DECOMPRESSION, PARTIAL ACROMIOPLASTY WITH CORACOAROMIAL LABRUM DEBRIDEMENT RELEASE DISTAL CLAVICULECTOMY,  ROTATOR CUFF REPAIR AND EXTENSIVE DEBRIDEMENT;  Surgeon: Ninetta Lights, MD;  Location: [place];  Service: Orthopedics;  Laterality: Right;  . TENDON REPAIR  June 06, 2011   right elbow, Dr. Percell Miller    There were no vitals filed for this visit.   Subjective Assessment - 01/12/21 0843    Subjective Pt reports no new changes since previous session.    Pertinent History hypothyroidism, HTN, hearing problem, GERD, hx prostate CA, back surgery 1978, B RTC repair & R biceps tendon repair    Limitations Sitting;Lifting;Standing;Walking;House hold activities    Diagnostic tests 10/07/20 brain MRI: Small remote lacunar infarct in the right cerebellar hemisphere. Mild chronic small vessel ischemia parenchymal  volume loss.    Patient Stated Goals improve balance            [facility] Adult PT Treatment/Exercise - 01/12/21 0001      High Level Balance   High Level Balance Activities --    High Level Balance Comments Step ups 6" Step taps from airex to step x 30 marches on foam x 30.  Airex balance EO x30 sec, NBOS EC 30 sec + dizziness. Airex balance with head nods to  cones horizontal/vertical x20 STS from foam x10.  Marching fwd/bwd with head turns horizontal 10 ft x 3, vertical 10 ft x 3.  Tandem walking fwd/bwd 10 ft x 3 on floor, 5 reps fwd/bwd on foam balance beam with 3 instances of touch down for balance. Standing WBOS vs shoulder width apart on foam balance beam with multidirection beach ball tosses 10 x 3.      Knee/Hip Exercises: Aerobic   Recumbent Bike Lvl 5, 7 min      Knee/Hip Exercises: Machines for Strengthening   Cybex Knee Extension BLE 15# 2 x 10    Cybex Knee Flexion B LEs: 25# x 2 x 15           Vestibular Treatment/Exercise - 01/12/21 0001      Vestibular Treatment/Exercise   Vestibular Treatment Provided Habituation;Gaze    Gaze Exercises X1 Viewing Horizontal;Eye/Head Exercise Horizontal;X1 Viewing Vertical;Comment      X1 Viewing Horizontal   Foot Position standing shoulder width    Reps 10    Comments no c/o difficulty      X1 Viewing Vertical   Foot Position standing shoulder width    Reps 10    Comments no c/o difficulty      Eye/Head Exercise Horizontal   Foot Position VOR cancellation: sitting   Also done in standing foot shoulder width apart today - pt c/o incr imbalance   Reps 10    Comments    Standing shoulder width on Airex with x1 viewing vertical x15, horizontal x15. C/o inreased imbalance with horizontal > vertical.             PT Short Term Goals - 01/09/21 0941      PT SHORT TERM GOAL #1   Title Patient to be independent with initial HEP.    Time 3    Period Weeks    Status Achieved    Target Date 12/28/20             PT Long Term Goals - 01/09/21 0906      PT LONG TERM GOAL #1   Title Patient to be independent with advanced HEP.    Time 6    Period Weeks    Status Partially Met   met for current     PT LONG TERM GOAL #2   Title Patient to demonstrate B LE strength >/=4+/5.    Time 6    Period Weeks    Status Partially Met   01/04/21:  partially met as HS weakness      PT LONG TERM GOAL #3   Title Patient to demonstrate mid sway with M-CTSIB condition EC/foam surface to improve ability to balance in dark/uneven environments.    Time 6    Period Weeks    Status Partially Met   demonstrating mild/moderate sway     PT LONG TERM GOAL #4   Title patient to demo active L ankle ROM to [facility] to normalize ADLS    Time 6  Period Weeks    Status Partially Met   01/04/21: notable improvement in L DF ROM to 16 dg     PT LONG TERM GOAL #5   Title Patient to score >19 on DGI in order to decrease risk of falls.    Time 6    Period Weeks    Status Achieved                 Plan - 01/12/21 0935    Clinical Impression Statement Pt tolerated treatment session well today with focus on higher level balance activities. Increased reports of feeling imbalanced on compliant foam surfaces and with EC activities. He did nicely with repeated practice of fwd/bwd walks but demo's 5 instances of imbalance particularly with bwd walking with head turns. Trialed tandem stepping on foam balance beam today with moderate sway noted but only 3 instances of touch down support needed throughout. Pt educated on continuing to keep up with updated HEP given last session. Pt with no reports of imbalance at end of session and verbalized understanding of previous HEP.    Personal Factors and Comorbidities Age;Comorbidity 3+;Fitness;Past/Current Experience;Time since onset of injury/illness/exacerbation    Comorbidities hypothyroidism, HTN, hearing problem, GERD, hx prostate CA, back surgery 1978, B RTC repair & R biceps tendon repair    Examination-Activity Limitations Bed Mobility;Bathing;Bend;Squat;Stairs;Stand;Carry;Toileting;Dressing;Transfers;Hygiene/Grooming;Lift;Locomotion Level;Reach Overhead    Examination-Participation Restrictions Church;Cleaning;Shop;Community Activity;Driving;Yard Work;Laundry;Meal Prep    Rehab Potential Good    PT Frequency 2x / week    PT Duration 6 weeks     PT Treatment/Interventions ADLs/Self Care Home Management;Cryotherapy;Electrical Stimulation;Canalith Repostioning;Moist Heat;Balance training;Therapeutic exercise;Therapeutic activities;Functional mobility training;Stair training;Gait training;Ultrasound;Neuromuscular re-education;Patient/family education;Manual techniques;Vestibular;Taping;Energy conservation;Dry needling;Passive range of motion    PT Next Visit Plan high level balance exercises, R HS strengthening, static/dynamic balance    Consulted and Agree with Plan of Care Patient           Patient will benefit from skilled therapeutic intervention in order to improve the following deficits and impairments:  Abnormal gait,Decreased activity tolerance,Decreased strength,Pain,Decreased balance,Difficulty walking,Improper body mechanics,Decreased range of motion,Dizziness,Impaired flexibility,Postural dysfunction,Decreased coordination  Visit Diagnosis: Unsteadiness on feet  Dizziness and giddiness  Muscle weakness (generalized)     Problem List Patient Active Problem List   Diagnosis Date Noted  . Prediabetes 05/27/2018  . Stomatitis 04/17/2017  . Dyslipidemia (high LDL; low HDL) 08/15/2016  . S/P arthroscopy of shoulder 05/31/2016  . History of prostate cancer 05/09/2016  . Complete rotator cuff tear of left shoulder 07/05/2015  . Tennis elbow 05/30/2014  . Rotator cuff (capsule) sprain 03/10/2014  . Right shoulder pain 02/11/2014  . Memory difficulty 12/18/2012  . Gynecomastia, male 07/30/2012  . OSA (obstructive sleep apnea) 04/23/2011  . EXTERNAL HEMORRHOIDS 02/12/2011  . Prostate cancer ([place]) 11/12/2010  . PLANTAR FASCIITIS 04/09/2010  . Hypothyroidism 12/08/2009  . NEOPLASM OF UNCERTAIN BEHAVIOR OF SKIN 03/10/2009  . OSTEOARTHRITIS 02/14/2009  . HEARING DEFICIT 02/08/2009  . Essential hypertension 02/08/2009  . Allergic rhinitis 02/08/2009  . GERD 02/08/2009  . PROTEINURIA 02/08/2009  . DIVERTICULITIS, HX OF  02/08/2009    Hall Busing, PT, DPT 01/12/2021, 9:42 AM  [facility], [place], [address] Phone: [phone]   Fax:  [phone]  Name: Zachary [name] MRN: [id number] Date of Birth: [birth year]

## 2021-01-15 ENCOUNTER — Emergency Department (HOSPITAL_BASED_OUTPATIENT_CLINIC_OR_DEPARTMENT_OTHER): Payer: Medicare Other

## 2021-01-15 ENCOUNTER — Other Ambulatory Visit: Payer: Self-pay

## 2021-01-15 ENCOUNTER — Encounter (HOSPITAL_BASED_OUTPATIENT_CLINIC_OR_DEPARTMENT_OTHER): Payer: Self-pay | Admitting: *Deleted

## 2021-01-15 ENCOUNTER — Observation Stay (HOSPITAL_BASED_OUTPATIENT_CLINIC_OR_DEPARTMENT_OTHER)
Admission: EM | Admit: 2021-01-15 | Discharge: 2021-01-17 | Disposition: A | Discharge status: Home / Self Care | Payer: Medicare Other | Attending: Internal Medicine | Admitting: Internal Medicine

## 2021-01-15 DIAGNOSIS — R0789 Other chest pain: Secondary | ICD-10-CM | POA: Diagnosis not present

## 2021-01-15 DIAGNOSIS — Z66 Do not resuscitate: Secondary | ICD-10-CM | POA: Diagnosis present

## 2021-01-15 DIAGNOSIS — R413 Other amnesia: Secondary | ICD-10-CM | POA: Diagnosis not present

## 2021-01-15 DIAGNOSIS — I200 Unstable angina: Secondary | ICD-10-CM | POA: Diagnosis not present

## 2021-01-15 DIAGNOSIS — K219 Gastro-esophageal reflux disease without esophagitis: Secondary | ICD-10-CM | POA: Diagnosis present

## 2021-01-15 DIAGNOSIS — Z20822 Contact with and (suspected) exposure to covid-19: Secondary | ICD-10-CM | POA: Insufficient documentation

## 2021-01-15 DIAGNOSIS — Z79899 Other long term (current) drug therapy: Secondary | ICD-10-CM | POA: Insufficient documentation

## 2021-01-15 DIAGNOSIS — Z7982 Long term (current) use of aspirin: Secondary | ICD-10-CM | POA: Diagnosis not present

## 2021-01-15 DIAGNOSIS — G4733 Obstructive sleep apnea (adult) (pediatric): Secondary | ICD-10-CM | POA: Diagnosis present

## 2021-01-15 DIAGNOSIS — C61 Malignant neoplasm of prostate: Secondary | ICD-10-CM | POA: Diagnosis present

## 2021-01-15 DIAGNOSIS — E039 Hypothyroidism, unspecified: Secondary | ICD-10-CM | POA: Insufficient documentation

## 2021-01-15 DIAGNOSIS — I10 Essential (primary) hypertension: Secondary | ICD-10-CM | POA: Diagnosis present

## 2021-01-15 DIAGNOSIS — R079 Chest pain, unspecified: Secondary | ICD-10-CM | POA: Diagnosis present

## 2021-01-15 DIAGNOSIS — E785 Hyperlipidemia, unspecified: Secondary | ICD-10-CM | POA: Diagnosis present

## 2021-01-15 DIAGNOSIS — R21 Rash and other nonspecific skin eruption: Secondary | ICD-10-CM | POA: Diagnosis not present

## 2021-01-15 LAB — CBC
HCT: 46.4 % (ref 39.0–52.0)
Hemoglobin: 15.9 g/dL (ref 13.0–17.0)
MCH: 31.8 pg (ref 26.0–34.0)
MCHC: 34.3 g/dL (ref 30.0–36.0)
MCV: 92.8 fL (ref 80.0–100.0)
Platelets: 252 10*3/uL (ref 150–400)
RBC: 5 MIL/uL (ref 4.22–5.81)
RDW: 12.6 % (ref 11.5–15.5)
WBC: 7.5 10*3/uL (ref 4.0–10.5)
nRBC: 0 % (ref 0.0–0.2)

## 2021-01-15 LAB — BASIC METABOLIC PANEL
Anion gap: 9 (ref 5–15)
BUN: 24 mg/dL — ABNORMAL HIGH (ref 8–23)
CO2: 27 mmol/L (ref 22–32)
Calcium: 9.9 mg/dL (ref 8.9–10.3)
Chloride: 100 mmol/L (ref 98–111)
Creatinine, Ser: 1.06 mg/dL (ref 0.61–1.24)
GFR, Estimated: >60 mL/min (ref >60)
Glucose, Bld: 125 mg/dL — ABNORMAL HIGH (ref 70–99)
Potassium: 3.7 mmol/L (ref 3.5–5.1)
Sodium: 136 mmol/L (ref 135–145)

## 2021-01-15 LAB — TROPONIN I (HIGH SENSITIVITY)
Troponin I (High Sensitivity): 6 ng/L (ref <18)
Troponin I (High Sensitivity): 7 ng/L (ref <18)

## 2021-01-15 MED ORDER — NITROGLYCERIN 0.4 MG SL SUBL
0.4000 mg | SUBLINGUAL_TABLET | SUBLINGUAL | Status: AC | PRN
  Administered 2021-01-15 (×3): 0.4 mg via SUBLINGUAL
  Filled 2021-01-15: qty 1

## 2021-01-15 MED ORDER — SODIUM BICARBONATE 650 MG PO TABS
650.0000 mg | ORAL_TABLET | Freq: Every day | ORAL | Status: DC
  Administered 2021-01-17: 650 mg via ORAL
  Filled 2021-01-15: qty 1

## 2021-01-15 MED ORDER — NITROGLYCERIN 2 % TD OINT
1.0000 [in_us] | TOPICAL_OINTMENT | Freq: Once | TRANSDERMAL | Status: AC
  Administered 2021-01-15: 1 [in_us] via TOPICAL
  Filled 2021-01-15: qty 1

## 2021-01-15 MED ORDER — ASPIRIN 81 MG PO CHEW
324.0000 mg | CHEWABLE_TABLET | Freq: Once | ORAL | Status: DC
  Filled 2021-01-15: qty 4

## 2021-01-15 MED ORDER — PANTOPRAZOLE SODIUM 40 MG PO TBEC
40.0000 mg | DELAYED_RELEASE_TABLET | Freq: Every day | ORAL | Status: DC
  Administered 2021-01-17: 40 mg via ORAL
  Filled 2021-01-15: qty 1

## 2021-01-15 MED ORDER — PRAVASTATIN SODIUM 40 MG PO TABS
20.0000 mg | ORAL_TABLET | Freq: Every day | ORAL | Status: DC
  Filled 2021-01-15: qty 1

## 2021-01-15 NOTE — ED Notes (Signed)
Pt. Reports pain started at approx. 3:10 pm and he doing nothing heavy at the time.  Pt. wife said the Pt. Was a littly clammy at time of incident and No reports of nausea or vomiting.

## 2021-01-15 NOTE — ED Triage Notes (Signed)
Chest pain sudden onset while working in the garage.

## 2021-01-15 NOTE — ED Provider Notes (Signed)
[facility] EMERGENCY DEPARTMENT Provider Note   CSN: [id number] Arrival date & time: 01/15/21  1611     History Chief Complaint  Patient presents with  . Chest Pain    Zachary [name] is a 70 y.o. male.  70yo M w/ PMH including HTN, HLD, prostate CA, TIA, OSA, GERD who p/w chest pain. Pt reports that at about 3:10pm today while he was sitting using a circular saw, he began having central chest pain that he describes as "like someone sitting on my chest."  Initially the pain radiated down his left arm and was associated with mild shortness of breath and clamminess.  The pain has eased off but not completely resolved since it began.  He denies any previous history of similar pain episodes and denies any problems with physical exertion recently.  He is compliant with medications and denies any recent medication changes.  No recent illness, recent travel, history of blood clots, or leg swelling/pain.  Of note, patient was having different type of chest pain in 2010 (ultimately related to GERD) for which he had cardiac cath which showed mild disease, didn't require stenting. He no longer follows w/ cardiology.   The history is provided by the patient and the spouse.  Chest Pain      Past Medical History:  Diagnosis Date  . Arthritis   . Articular cartilage disease    left shoulder  . Cancer ([place]) 2015   positive prostate cancer bx  . Diverticulitis   . Dyslipidemia (high LDL; low HDL) 08/15/2016  . GERD (gastroesophageal reflux disease)   . Hearing problem    hearing deficit  . Hemorrhoids   . Hypertension   . Hypothyroidism   . Sleep apnea    uses a cpap  . Wears glasses   . Wears hearing aid    both ears    Patient Active Problem List   Diagnosis Date Noted  . Chest pain 01/15/2021  . Prediabetes 05/27/2018  . Stomatitis 04/17/2017  . Dyslipidemia (high LDL; low HDL) 08/15/2016  . S/P arthroscopy of shoulder 05/31/2016  . History of prostate cancer  05/09/2016  . Complete rotator cuff tear of left shoulder 07/05/2015  . Tennis elbow 05/30/2014  . Rotator cuff (capsule) sprain 03/10/2014  . Right shoulder pain 02/11/2014  . Memory difficulty 12/18/2012  . Gynecomastia, male 07/30/2012  . OSA (obstructive sleep apnea) 04/23/2011  . EXTERNAL HEMORRHOIDS 02/12/2011  . Prostate cancer ([place]) 11/12/2010  . PLANTAR FASCIITIS 04/09/2010  . Hypothyroidism 12/08/2009  . NEOPLASM OF UNCERTAIN BEHAVIOR OF SKIN 03/10/2009  . OSTEOARTHRITIS 02/14/2009  . HEARING DEFICIT 02/08/2009  . Essential hypertension 02/08/2009  . Allergic rhinitis 02/08/2009  . GERD 02/08/2009  . PROTEINURIA 02/08/2009  . DIVERTICULITIS, HX OF 02/08/2009    Past Surgical History:  Procedure Laterality Date  . BACK SURGERY  1978   herniated disksurgery  . CARDIAC CATHETERIZATION  08/01/2010  . COLONOSCOPY    . EXAM UNDER ANESTHESIA WITH MANIPULATION OF SHOULDER Right 09/15/2014   Procedure: RIGHT SHOULDER MANIPULATION UNDER ANESTHESIA;  Surgeon: Ninetta Lights, MD;  Location: [facility];  Service: Orthopedics;  Laterality: Right;  . HEMORRHOID SURGERY  08/2006  . SHOULDER ARTHROSCOPY WITH DISTAL CLAVICLE RESECTION Left 07/07/2015   Procedure: SHOULDER ARTHROSCOPY WITH DISTAL CLAVICLE RESECTION;  Surgeon: Kathryne Hitch, MD;  Location: [facility];  Service: Orthopedics;  Laterality: Left;  . SHOULDER ARTHROSCOPY WITH ROTATOR CUFF REPAIR Left 12/07/2015   Procedure: LEFT SHOULDER ARTHROSCOPY  DEBRIDEMENT, WITH ROTATOR CUFF REPAIR;  Surgeon: Ninetta Lights, MD;  Location: [facility];  Service: Orthopedics;  Laterality: Left;  . SHOULDER ARTHROSCOPY WITH ROTATOR CUFF REPAIR AND SUBACROMIAL DECOMPRESSION Left 07/07/2015   Procedure: LEFT SHOULDER SCOPE DEBRIDEMENT, SUBACROMIAL DECOMPRESSION, DISTAL CLAVICULECTOMY, ROTATOR CUFF REPAIR  ;  Surgeon: Kathryne Hitch, MD;  Location: [facility];  Service: Orthopedics;   Laterality: Left;  ANESTHESIA: GENERAL, PRE/POST OP SCALENE  . SHOULDER ARTHROSCOPY WITH SUBACROMIAL DECOMPRESSION, ROTATOR CUFF REPAIR AND BICEP TENDON REPAIR Right 03/10/2014   Procedure: RIGHT SHOULDER ARTHROSCOPY WITH SUBACROMIAL DECOMPRESSION, PARTIAL ACROMIOPLASTY WITH CORACOAROMIAL LABRUM DEBRIDEMENT RELEASE DISTAL CLAVICULECTOMY,  ROTATOR CUFF REPAIR AND EXTENSIVE DEBRIDEMENT;  Surgeon: Ninetta Lights, MD;  Location: [facility];  Service: Orthopedics;  Laterality: Right;  . TENDON REPAIR  June 06, 2011   right elbow, Dr. Percell Miller       Family History  Problem Relation Age of Onset  . Heart disease Father   . Breast cancer Mother   . Cancer Brother   . Alcohol abuse Other   . Arthritis Other   . Cancer Other        Breast, Prostate  . Coronary artery disease Other   . Irritable bowel syndrome Other   . Cystic fibrosis Other   . Colon cancer Neg Hx     Social History   Tobacco Use  . Smoking status: Never Smoker  . Smokeless tobacco: Never Used  Substance Use Topics  . Alcohol use: No  . Drug use: No    Home Medications Prior to Admission medications   Medication Sig Start Date End Date Taking? Authorizing Provider  aspirin EC 81 MG EC tablet Take 81 mg by mouth daily.    [provider]  EUTHYROX 100 MCG tablet Take 1 tablet by mouth once daily 01/03/21   Nani Ravens, Crosby Oyster, DO  lisinopril (ZESTRIL) 5 MG tablet TAKE 1 TABLET BY MOUTH ONCE DAILY . 04/14/20   [name], DO  loratadine (CLARITIN) 10 MG tablet Take 10 mg by mouth daily.    [provider]  Melatonin 5 MG CAPS Take 2 capsules by mouth daily.    [provider]  meloxicam (MOBIC) 7.5 MG tablet Take 1 tablet (7.5 mg total) by mouth daily. Patient not taking: Reported on 12/07/2020 11/20/20   [name], DO  omeprazole (PRILOSEC) 20 MG capsule Take 1 capsule (20 mg total) by mouth daily. 04/14/20   [name], DO  pravastatin  (PRAVACHOL) 20 MG tablet Take 1 tablet (20 mg total) by mouth daily. 11/20/20   [name], DO  sodium bicarbonate 650 MG tablet Take 1 tablet by mouth once daily 10/25/20   [name], DO    Allergies    Oxycodone, Atorvastatin, and Simvastatin  Review of Systems   Review of Systems  Cardiovascular: Positive for chest pain.   All other systems reviewed and are negative except that which was mentioned in HPI  Physical Exam Updated Vital Signs BP 110/78   Pulse 60   Temp 97.6 F (36.4 C) (Oral)   Resp 19   Ht 5\' 8"  (1.727 m)   Wt 91.2 kg   SpO2 95%   BMI 30.57 kg/m   Physical Exam Vitals and nursing note reviewed.  Constitutional:      General: He is not in acute distress.    Appearance: He is well-developed and well-nourished.  HENT:     Head: Normocephalic  and atraumatic.      Comments: Moist mucous membranesEyes:     Conjunctiva/sclera: Conjunctivae normal.  Cardiovascular:     Rate and Rhythm: Normal rate and regular rhythm.     Heart sounds: Normal heart sounds. No murmur heard.   Pulmonary:     Effort: Pulmonary effort is normal.     Breath sounds: Normal breath sounds.  Abdominal:     General: Bowel sounds are normal. There is no distension.     Palpations: Abdomen is soft.     Tenderness: There is no abdominal tenderness.  Musculoskeletal:        General: No edema.     Cervical back: Neck supple.     Right lower leg: No tenderness. No edema.     Left lower leg: No tenderness. No edema.  Skin:    General: Skin is warm and dry.  Neurological:     Mental Status: He is alert and oriented to person, place, and time.     Comments: Fluent speech  Psychiatric:        Mood and Affect: Mood and affect and mood normal.        Judgment: Judgment normal.     ED Results / Procedures / Treatments   Labs (all labs ordered are listed, but only abnormal results are displayed) Labs Reviewed  BASIC METABOLIC PANEL - Abnormal; Notable for  the following components:      Result Value   Glucose, Bld 125 (*)    BUN 24 (*)    All other components within normal limits  SARS CORONAVIRUS 2 (TAT 6-24 HRS)  CBC  TROPONIN I (HIGH SENSITIVITY)  TROPONIN I (HIGH SENSITIVITY)  TROPONIN I (HIGH SENSITIVITY)    EKG EKG Interpretation  Date/Time:  Monday January 15 2021 16:12:23 EST Ventricular Rate:  82 PR Interval:  170 QRS Duration: 86 QT Interval:  370 QTC Calculation: 432 R Axis:   64 Text Interpretation: Normal sinus rhythm Nonspecific ST abnormality Abnormal ECG No significant change since last tracing Confirmed by Theotis Burrow [phone]) on 01/15/2021 4:43:39 PM   Radiology DG Chest 2 View  Result Date: 01/15/2021 CLINICAL DATA:  70 year old male with chest pain. EXAM: CHEST - 2 VIEW COMPARISON:  Chest radiograph dated 07/23/2019. FINDINGS: There is mild chronic interstitial coarsening and bronchitic changes. No focal consolidation, pleural effusion, or pneumothorax. The cardiac silhouette is within limits. No acute osseous pathology. Degenerative changes of the spine. IMPRESSION: No active cardiopulmonary disease. Electronically Signed   By: Anner Crete M.D.   On: 01/15/2021 16:54    Procedures Procedures   Medications Ordered in ED Medications  aspirin chewable tablet 324 mg (324 mg Oral Not Given 01/15/21 1654)  sodium bicarbonate tablet 650 mg (has no administration in time range)  pravastatin (PRAVACHOL) tablet 20 mg (has no administration in time range)  pantoprazole (PROTONIX) EC tablet 40 mg (has no administration in time range)  nitroGLYCERIN (NITROSTAT) SL tablet 0.4 mg (0.4 mg Sublingual Given 01/15/21 1731)  nitroGLYCERIN (NITROGLYN) 2 % ointment 1 inch (1 inch Topical Given 01/15/21 1817)    ED Course  I have reviewed the triage vital signs and the nursing notes.  Pertinent labs & imaging results that were available during my care of the patient were reviewed by me and considered in my medical  decision making (see chart for details).    MDM Rules/Calculators/A&P  VS reassuring, EKG without acute ischemic changes on arrival. PT had taken 324mg  aspirin just PTA. Initial trop negative, CXR clear. Sx not suggestive of aortic dissection or PE however I am concerned about unstable angina. He initially responded well to SL NTG, later required nitropaste for return of pain symptoms.   Discussed with cardiology, Dr. Domenic Polite, who recommended admission to medicine and n.p.o. at midnight just in case he needs further cardiac work-up in the morning.  Discussed admission with [facility], Dr. Clearence Ped.  Final Clinical Impression(s) / ED Diagnoses Final diagnoses:  Unstable angina [facility])    Rx / DC Orders ED Discharge Orders    None       Little, Wenda Overland, MD 01/15/21 2314

## 2021-01-16 ENCOUNTER — Other Ambulatory Visit: Payer: Self-pay

## 2021-01-16 ENCOUNTER — Ambulatory Visit: Payer: Medicare Other

## 2021-01-16 ENCOUNTER — Encounter (HOSPITAL_COMMUNITY)
Admission: EM | Disposition: A | Discharge status: Home / Self Care | Payer: Self-pay | Source: Home / Self Care | Attending: Emergency Medicine

## 2021-01-16 ENCOUNTER — Encounter (HOSPITAL_COMMUNITY): Payer: Self-pay | Admitting: Family Medicine

## 2021-01-16 ENCOUNTER — Observation Stay (HOSPITAL_BASED_OUTPATIENT_CLINIC_OR_DEPARTMENT_OTHER): Payer: Medicare Other

## 2021-01-16 DIAGNOSIS — E7800 Pure hypercholesterolemia, unspecified: Secondary | ICD-10-CM | POA: Diagnosis not present

## 2021-01-16 DIAGNOSIS — I2583 Coronary atherosclerosis due to lipid rich plaque: Secondary | ICD-10-CM

## 2021-01-16 DIAGNOSIS — R413 Other amnesia: Secondary | ICD-10-CM

## 2021-01-16 DIAGNOSIS — C61 Malignant neoplasm of prostate: Secondary | ICD-10-CM | POA: Diagnosis not present

## 2021-01-16 DIAGNOSIS — K219 Gastro-esophageal reflux disease without esophagitis: Secondary | ICD-10-CM | POA: Diagnosis not present

## 2021-01-16 DIAGNOSIS — I10 Essential (primary) hypertension: Secondary | ICD-10-CM | POA: Diagnosis not present

## 2021-01-16 DIAGNOSIS — G4733 Obstructive sleep apnea (adult) (pediatric): Secondary | ICD-10-CM | POA: Diagnosis not present

## 2021-01-16 DIAGNOSIS — E785 Hyperlipidemia, unspecified: Secondary | ICD-10-CM | POA: Diagnosis not present

## 2021-01-16 DIAGNOSIS — I2510 Atherosclerotic heart disease of native coronary artery without angina pectoris: Secondary | ICD-10-CM

## 2021-01-16 DIAGNOSIS — Z66 Do not resuscitate: Secondary | ICD-10-CM | POA: Diagnosis not present

## 2021-01-16 DIAGNOSIS — R0789 Other chest pain: Secondary | ICD-10-CM | POA: Diagnosis not present

## 2021-01-16 DIAGNOSIS — R079 Chest pain, unspecified: Secondary | ICD-10-CM | POA: Diagnosis not present

## 2021-01-16 DIAGNOSIS — I200 Unstable angina: Secondary | ICD-10-CM | POA: Diagnosis not present

## 2021-01-16 HISTORY — PX: LEFT HEART CATH AND CORONARY ANGIOGRAPHY: CATH118249

## 2021-01-16 HISTORY — DX: Do not resuscitate: Z66

## 2021-01-16 LAB — LIPID PANEL
Cholesterol: 144 mg/dL (ref 0–200)
HDL: 34 mg/dL — ABNORMAL LOW (ref >40)
LDL Cholesterol: 90 mg/dL (ref 0–99)
Total CHOL/HDL Ratio: 4.2 RATIO
Triglycerides: 100 mg/dL (ref <150)
VLDL: 20 mg/dL (ref 0–40)

## 2021-01-16 LAB — ECHOCARDIOGRAM COMPLETE
Area-P 1/2: 3.31 cm2
Height: 68 in
S' Lateral: 3.1 cm
Weight: 3213.42 oz

## 2021-01-16 LAB — HEMOGLOBIN A1C
Hgb A1c MFr Bld: 5.8 % — ABNORMAL HIGH (ref 4.8–5.6)
Mean Plasma Glucose: 119.76 mg/dL

## 2021-01-16 LAB — TROPONIN I (HIGH SENSITIVITY)
Troponin I (High Sensitivity): 6 ng/L (ref <18)
Troponin I (High Sensitivity): 8 ng/L (ref <18)

## 2021-01-16 LAB — TSH: TSH: 3.656 u[IU]/mL (ref 0.350–4.500)

## 2021-01-16 LAB — SARS CORONAVIRUS 2 (TAT 6-24 HRS): SARS Coronavirus 2: NEGATIVE

## 2021-01-16 LAB — RAPID URINE DRUG SCREEN, HOSP PERFORMED
Amphetamines: NOT DETECTED
Barbiturates: NOT DETECTED
Benzodiazepines: NOT DETECTED
Cocaine: NOT DETECTED
Opiates: POSITIVE — AB
Tetrahydrocannabinol: NOT DETECTED

## 2021-01-16 LAB — HIV ANTIBODY (ROUTINE TESTING W REFLEX): HIV Screen 4th Generation wRfx: NONREACTIVE

## 2021-01-16 LAB — MRSA PCR SCREENING: MRSA by PCR: NEGATIVE

## 2021-01-16 SURGERY — LEFT HEART CATH AND CORONARY ANGIOGRAPHY
Anesthesia: LOCAL

## 2021-01-16 MED ORDER — MELATONIN 5 MG PO TABS: 10.0000 mg | ORAL_TABLET | Freq: Every day | ORAL | Status: DC

## 2021-01-16 MED ORDER — DIAZEPAM 5 MG PO TABS: 5.0000 mg | ORAL_TABLET | Freq: Four times a day (QID) | ORAL | Status: DC | PRN

## 2021-01-16 MED ORDER — LISINOPRIL 5 MG PO TABS
5.0000 mg | ORAL_TABLET | Freq: Every day | ORAL | Status: DC
Start: 2021-01-16 — End: 2021-01-17
  Administered 2021-01-17: 5 mg via ORAL
  Filled 2021-01-16: qty 1

## 2021-01-16 MED ORDER — HEPARIN (PORCINE) IN NACL 1000-0.9 UT/500ML-% IV SOLN
INTRAVENOUS | Status: AC
  Filled 2021-01-16: qty 1000

## 2021-01-16 MED ORDER — HEPARIN (PORCINE) IN NACL 1000-0.9 UT/500ML-% IV SOLN
INTRAVENOUS | Status: DC | PRN
  Administered 2021-01-16 (×2): 500 mL

## 2021-01-16 MED ORDER — MORPHINE SULFATE (PF) 4 MG/ML IV SOLN
4.0000 mg | Freq: Once | INTRAVENOUS | Status: AC
  Administered 2021-01-16: 4 mg via INTRAVENOUS
  Filled 2021-01-16: qty 1

## 2021-01-16 MED ORDER — ROSUVASTATIN CALCIUM 20 MG PO TABS: 20.0000 mg | ORAL_TABLET | Freq: Every day | ORAL | Status: DC

## 2021-01-16 MED ORDER — ASPIRIN 81 MG PO CHEW
81.0000 mg | CHEWABLE_TABLET | Freq: Every day | ORAL | Status: DC
  Administered 2021-01-17: 81 mg via ORAL
  Filled 2021-01-16: qty 1

## 2021-01-16 MED ORDER — ASPIRIN EC 81 MG PO TBEC: 81.0000 mg | DELAYED_RELEASE_TABLET | Freq: Every day | ORAL | Status: DC

## 2021-01-16 MED ORDER — SODIUM CHLORIDE 0.9% FLUSH: 3.0000 mL | INTRAVENOUS | Status: DC | PRN

## 2021-01-16 MED ORDER — ASPIRIN 81 MG PO CHEW: 81.0000 mg | CHEWABLE_TABLET | ORAL | Status: DC

## 2021-01-16 MED ORDER — LIDOCAINE HCL (PF) 1 % IJ SOLN
INTRAMUSCULAR | Status: DC | PRN
  Administered 2021-01-16: 2 mL via INTRADERMAL

## 2021-01-16 MED ORDER — FENTANYL CITRATE (PF) 100 MCG/2ML IJ SOLN
INTRAMUSCULAR | Status: DC | PRN
  Administered 2021-01-16: 50 ug via INTRAVENOUS

## 2021-01-16 MED ORDER — SODIUM CHLORIDE 0.9 % IV SOLN: 250.0000 mL | INTRAVENOUS | Status: DC | PRN

## 2021-01-16 MED ORDER — SODIUM CHLORIDE 0.9% FLUSH: 3.0000 mL | Freq: Two times a day (BID) | INTRAVENOUS | Status: DC

## 2021-01-16 MED ORDER — VERAPAMIL HCL 2.5 MG/ML IV SOLN
INTRAVENOUS | Status: DC | PRN
  Administered 2021-01-16: 10 mL via INTRA_ARTERIAL

## 2021-01-16 MED ORDER — SODIUM CHLORIDE 0.9 % WEIGHT BASED INFUSION: 3.0000 mL/kg/h | INTRAVENOUS | Status: DC

## 2021-01-16 MED ORDER — LORATADINE 10 MG PO TABS
10.0000 mg | ORAL_TABLET | Freq: Every day | ORAL | Status: DC
  Administered 2021-01-17: 10 mg via ORAL
  Filled 2021-01-16: qty 1

## 2021-01-16 MED ORDER — ACETAMINOPHEN 325 MG PO TABS: 650.0000 mg | ORAL_TABLET | ORAL | Status: DC | PRN

## 2021-01-16 MED ORDER — IOHEXOL 350 MG/ML SOLN
INTRAVENOUS | Status: DC | PRN
  Administered 2021-01-16: 45 mL

## 2021-01-16 MED ORDER — HYDRALAZINE HCL 20 MG/ML IJ SOLN: 10.0000 mg | INTRAMUSCULAR | Status: AC | PRN

## 2021-01-16 MED ORDER — SODIUM CHLORIDE 0.9 % WEIGHT BASED INFUSION: 1.0000 mL/kg/h | INTRAVENOUS | Status: DC

## 2021-01-16 MED ORDER — LIDOCAINE HCL (PF) 1 % IJ SOLN
INTRAMUSCULAR | Status: AC
  Filled 2021-01-16: qty 30

## 2021-01-16 MED ORDER — SODIUM CHLORIDE 0.9 % IV SOLN
INTRAVENOUS | Status: DC | PRN
  Administered 2021-01-16: 10 mL/h via INTRAVENOUS

## 2021-01-16 MED ORDER — HEPARIN SODIUM (PORCINE) 1000 UNIT/ML IJ SOLN
INTRAMUSCULAR | Status: AC
  Filled 2021-01-16: qty 1

## 2021-01-16 MED ORDER — SODIUM CHLORIDE 0.9 % IV SOLN: INTRAVENOUS | Status: AC

## 2021-01-16 MED ORDER — MIDAZOLAM HCL 2 MG/2ML IJ SOLN
INTRAMUSCULAR | Status: AC
  Filled 2021-01-16: qty 2

## 2021-01-16 MED ORDER — LABETALOL HCL 5 MG/ML IV SOLN: 10.0000 mg | INTRAVENOUS | Status: AC | PRN

## 2021-01-16 MED ORDER — ONDANSETRON HCL 4 MG/2ML IJ SOLN: 4.0000 mg | Freq: Four times a day (QID) | INTRAMUSCULAR | Status: DC | PRN

## 2021-01-16 MED ORDER — HEPARIN SODIUM (PORCINE) 1000 UNIT/ML IJ SOLN
INTRAMUSCULAR | Status: DC | PRN
  Administered 2021-01-16: 4500 [IU] via INTRAVENOUS

## 2021-01-16 MED ORDER — FENTANYL CITRATE (PF) 100 MCG/2ML IJ SOLN
INTRAMUSCULAR | Status: AC
  Filled 2021-01-16: qty 2

## 2021-01-16 MED ORDER — ENOXAPARIN SODIUM 40 MG/0.4ML ~~LOC~~ SOLN
40.0000 mg | SUBCUTANEOUS | Status: DC
  Administered 2021-01-16 – 2021-01-17 (×2): 40 mg via SUBCUTANEOUS
  Filled 2021-01-16 (×2): qty 0.4

## 2021-01-16 MED ORDER — LEVOTHYROXINE SODIUM 100 MCG PO TABS
100.0000 ug | ORAL_TABLET | Freq: Every day | ORAL | Status: DC
  Administered 2021-01-16: 100 ug via ORAL
  Filled 2021-01-16: qty 1

## 2021-01-16 MED ORDER — MIDAZOLAM HCL 2 MG/2ML IJ SOLN
INTRAMUSCULAR | Status: DC | PRN
  Administered 2021-01-16: 2 mg via INTRAVENOUS

## 2021-01-16 MED ORDER — PRAVASTATIN SODIUM 40 MG PO TABS
80.0000 mg | ORAL_TABLET | Freq: Every day | ORAL | Status: DC
  Administered 2021-01-16: 80 mg via ORAL
  Filled 2021-01-16: qty 2

## 2021-01-16 MED ORDER — MORPHINE BOLUS VIA INFUSION: 4.0000 mg | Freq: Once | INTRAVENOUS | Status: DC

## 2021-01-16 MED ORDER — VERAPAMIL HCL 2.5 MG/ML IV SOLN
INTRAVENOUS | Status: AC
  Filled 2021-01-16: qty 2

## 2021-01-16 SURGICAL SUPPLY — 10 items
CATH INFINITI JR4 5F (CATHETERS) ×2 IMPLANT
CATH OPTITORQUE TIG 4.0 5F (CATHETERS) ×2 IMPLANT
DEVICE RAD COMP TR BAND LRG (VASCULAR PRODUCTS) ×2 IMPLANT
GLIDESHEATH SLEND SS 6F .021 (SHEATH) ×2 IMPLANT
GUIDEWIRE INQWIRE 1.5J.035X260 (WIRE) ×1 IMPLANT
INQWIRE 1.5J .035X260CM (WIRE) ×2
KIT HEART LEFT (KITS) ×2 IMPLANT
PACK CARDIAC CATHETERIZATION (CUSTOM PROCEDURE TRAY) ×2 IMPLANT
TRANSDUCER W/STOPCOCK (MISCELLANEOUS) ×2 IMPLANT
TUBING CIL FLEX 10 FLL-RA (TUBING) ×2 IMPLANT

## 2021-01-16 NOTE — Interval H&P Note (Signed)
Cath Lab Visit (complete for each Cath Lab visit)  Clinical Evaluation Leading to the Procedure:   ACS: No.  Non-ACS:    Anginal Classification: CCS III  Anti-ischemic medical therapy: No Therapy  Non-Invasive Test Results: No non-invasive testing performed  Prior CABG: No previous CABG      History and Physical Interval Note:  01/16/2021 1:34 PM  Zachary [name]  has presented today for surgery, with the diagnosis of unstable agnina.  The various methods of treatment have been discussed with the patient and family. After consideration of risks, benefits and other options for treatment, the patient has consented to  Procedure(s): LEFT HEART CATH AND CORONARY ANGIOGRAPHY (N/A) as a surgical intervention.  The patient's history has been reviewed, patient examined, no change in status, stable for surgery.  I have reviewed the patient's chart and labs.  Questions were answered to the patient's satisfaction.     Shelva Majestic

## 2021-01-16 NOTE — Plan of Care (Signed)
  Problem: Education: Goal: Knowledge of General Education information will improve Description: Including pain rating scale, medication(s)/side effects and non-pharmacologic comfort measures 01/16/2021 1914 by Thressa Sheller, RN Outcome: Progressing 01/16/2021 1902 by Thressa Sheller, RN Outcome: Progressing 01/16/2021 1901 by Thressa Sheller, RN Outcome: Progressing 01/16/2021 1901 by Thressa Sheller, RN Outcome: Progressing   Problem: Health Behavior/Discharge Planning: Goal: Ability to manage health-related needs will improve 01/16/2021 1914 by Thressa Sheller, RN Outcome: Progressing 01/16/2021 1902 by Thressa Sheller, RN Outcome: Progressing 01/16/2021 1901 by Thressa Sheller, RN Outcome: Progressing 01/16/2021 1901 by Thressa Sheller, RN Outcome: Progressing   Problem: Clinical Measurements: Goal: Ability to maintain clinical measurements within normal limits will improve 01/16/2021 1914 by Thressa Sheller, RN Outcome: Progressing 01/16/2021 1902 by Thressa Sheller, RN Outcome: Progressing 01/16/2021 1901 by Thressa Sheller, RN Outcome: Progressing 01/16/2021 1901 by Thressa Sheller, RN Outcome: Progressing   Problem: Clinical Measurements: Goal: Will remain free from infection 01/16/2021 1914 by Thressa Sheller, RN Outcome: Progressing 01/16/2021 1902 by Thressa Sheller, RN Outcome: Progressing 01/16/2021 1901 by Thressa Sheller, RN Outcome: Progressing 01/16/2021 1901 by Thressa Sheller, RN Outcome: Progressing   Problem: Clinical Measurements: Goal: Diagnostic test results will improve 01/16/2021 1914 by Thressa Sheller, RN Outcome: Progressing 01/16/2021 1902 by Thressa Sheller, RN Outcome: Progressing 01/16/2021 1901 by Thressa Sheller, RN Outcome: Progressing 01/16/2021 1901 by Thressa Sheller, RN Outcome: Progressing   Problem: Clinical Measurements: Goal: Respiratory complications will improve 01/16/2021  1914 by Thressa Sheller, RN Outcome: Progressing 01/16/2021 1902 by Thressa Sheller, RN Outcome: Progressing 01/16/2021 1901 by Thressa Sheller, RN Outcome: Progressing   Problem: Clinical Measurements: Goal: Cardiovascular complication will be avoided 01/16/2021 1914 by Thressa Sheller, RN Outcome: Progressing 01/16/2021 1902 by Thressa Sheller, RN Outcome: Progressing 01/16/2021 1901 by Thressa Sheller, RN Outcome: Progressing 01/16/2021 1901 by Thressa Sheller, RN Outcome: Progressing   Problem: Activity: Goal: Risk for activity intolerance will decrease 01/16/2021 1914 by Thressa Sheller, RN Outcome: Progressing 01/16/2021 1902 by Thressa Sheller, RN Outcome: Progressing 01/16/2021 1901 by Thressa Sheller, RN Outcome: Progressing 01/16/2021 1901 by Thressa Sheller, RN Outcome: Progressing   Problem: Nutrition: Goal: Adequate nutrition will be maintained 01/16/2021 1914 by Thressa Sheller, RN Outcome: Progressing 01/16/2021 1902 by Thressa Sheller, RN Outcome: Progressing 01/16/2021 1901 by Thressa Sheller, RN Outcome: Progressing 01/16/2021 1901 by Thressa Sheller, RN Outcome: Progressing   Problem: Coping: Goal: Level of anxiety will decrease 01/16/2021 1914 by Thressa Sheller, RN Outcome: Progressing 01/16/2021 1902 by Thressa Sheller, RN Outcome: Progressing 01/16/2021 1901 by Thressa Sheller, RN Outcome: Progressing 01/16/2021 1901 by Thressa Sheller, RN Outcome: Progressing   Problem: Skin Integrity: Goal: Risk for impaired skin integrity will decrease 01/16/2021 1914 by Thressa Sheller, RN Outcome: Progressing 01/16/2021 1902 by Thressa Sheller, RN Outcome: Progressing 01/16/2021 1901 by Thressa Sheller, RN Outcome: Progressing 01/16/2021 1901 by Thressa Sheller, RN Outcome: Progressing

## 2021-01-16 NOTE — Progress Notes (Signed)
Patient has home CPAP and places on himself without assistance. RT will monitor as needed. 

## 2021-01-16 NOTE — ED Notes (Signed)
Care Handoff/Report given to [place], [place] RN. All questions answered. Pt signed Transfer Consent Willingly.

## 2021-01-16 NOTE — H&P (Addendum)
History and Physical    Zachary [name] [id number] DOB: February 02, 1951 DOA: 01/15/2021  PCP: [name], DO Consultants:  [place] [facility] - urology Patient coming from:  Home - lives with wife; NOK: Wife, [phone]  Chief Complaint: chest pain  HPI: Zachary [name] is a 70 y.o. male with medical history significant of OSA on CPAP; hypothyroidism; HTN; HLD; TIA/mild cognitive impairment; and prostate CA presenting with chest pain.  He developed chest pain yesterday PM. He came into the house about 430PM.  He had symptoms about 30 minutes prior, was clammy, and then developed L arm radiation.  His dad died of an MI at about the same age and so his wife took him to the ER.  Substernal pain, started with sawing on a scroll saw (?exertional).   It continues to come and go and was present this AM but not now.  NTG eased the pain after 3 doses and then it recurred and they placed NTG paste.  He had 7/10 pain this AM and was given morphine with resolution of the pain.  Last catheterization about 10 years ago and he saw Dr. Stanford Breed; he was started on Prilosec and sodium bicarbonate with stabilization of GERD.  No further cardiac evaluation since.    ED Course: [facility] to [facility] transfer, per Dr. Clearence Ped:  70 year old previous [employer] employee presents for chest pain. Cath - 2011. Non intervenable disease - no stent. He has been on HTN and HLD meds since then. Today, central chest heaviness with radiation to left arm, associated clamminess and nausea. Improved with nitro. Pain came back - started nitro paste. EKG = no acute ischemic changes. Trops flat at 8,7. Cardiology advises to bring him in for obs - NPO at midnight for possible further diagnostic - coronary CT vs stress?   Review of Systems: As per HPI; otherwise review of systems reviewed and negative.   Ambulatory Status:  Ambulates without assistance  COVID Vaccine Status:  Complete plus booster  Past Medical History:  Diagnosis Date   . Arthritis   . Articular cartilage disease    left shoulder  . Diverticulitis   . DNR (do not resuscitate) 01/16/2021  . Dyslipidemia (high LDL; low HDL) 08/15/2016  . GERD (gastroesophageal reflux disease)   . Hearing problem    hearing deficit  . Hemorrhoids   . Hypertension   . Hypothyroidism   . Prostate cancer ([place]) 2015   treated and in remission  . Sleep apnea    uses a cpap  . Wears glasses   . Wears hearing aid    both ears    Past Surgical History:  Procedure Laterality Date  . BACK SURGERY  1978   herniated disksurgery  . CARDIAC CATHETERIZATION  08/01/2010  . COLONOSCOPY    . EXAM UNDER ANESTHESIA WITH MANIPULATION OF SHOULDER Right 09/15/2014   Procedure: RIGHT SHOULDER MANIPULATION UNDER ANESTHESIA;  Surgeon: Ninetta Lights, MD;  Location: [facility];  Service: Orthopedics;  Laterality: Right;  . HEMORRHOID SURGERY  08/2006  . SHOULDER ARTHROSCOPY WITH DISTAL CLAVICLE RESECTION Left 07/07/2015   Procedure: SHOULDER ARTHROSCOPY WITH DISTAL CLAVICLE RESECTION;  Surgeon: Kathryne Hitch, MD;  Location: [facility];  Service: Orthopedics;  Laterality: Left;  . SHOULDER ARTHROSCOPY WITH ROTATOR CUFF REPAIR Left 12/07/2015   Procedure: LEFT SHOULDER ARTHROSCOPY DEBRIDEMENT, WITH ROTATOR CUFF REPAIR;  Surgeon: Ninetta Lights, MD;  Location: [place];  Service: Orthopedics;  Laterality: Left;  . SHOULDER ARTHROSCOPY  WITH ROTATOR CUFF REPAIR AND SUBACROMIAL DECOMPRESSION Left 07/07/2015   Procedure: LEFT SHOULDER SCOPE DEBRIDEMENT, SUBACROMIAL DECOMPRESSION, DISTAL CLAVICULECTOMY, ROTATOR CUFF REPAIR  ;  Surgeon: Kathryne Hitch, MD;  Location: [place];  Service: Orthopedics;  Laterality: Left;  ANESTHESIA: GENERAL, PRE/POST OP SCALENE  . SHOULDER ARTHROSCOPY WITH SUBACROMIAL DECOMPRESSION, ROTATOR CUFF REPAIR AND BICEP TENDON REPAIR Right 03/10/2014   Procedure: RIGHT SHOULDER ARTHROSCOPY WITH SUBACROMIAL  DECOMPRESSION, PARTIAL ACROMIOPLASTY WITH CORACOAROMIAL LABRUM DEBRIDEMENT RELEASE DISTAL CLAVICULECTOMY,  ROTATOR CUFF REPAIR AND EXTENSIVE DEBRIDEMENT;  Surgeon: Ninetta Lights, MD;  Location: [facility];  Service: Orthopedics;  Laterality: Right;  . TENDON REPAIR  June 06, 2011   right elbow, Dr. Percell [place]    Social History   Socioeconomic History  . Marital status: Married    Spouse name: Zachary [name]  . Number of children: 2  . Years of education: Not on file  . Highest education level: Not on file  Occupational History  . 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 Smoking status: Never Smoker  . Smokeless tobacco: Never Used  Substance and Sexual Activity  . Alcohol use: Yes    Comment: occasional  . Drug use: No  . Sexual activity: Not on file  Other Topics Concern  . Not on file  Social History Narrative   Previously Chartered loss adjuster [employer]   Social Determinants of Health   Financial Resource Strain: Not on file  Food Insecurity: Not on file  Transportation Needs: Not on file  Physical Activity: Not on file  Stress: Not on file  Social Connections: Not on file  Intimate Partner Violence: Not on file    Allergies  Allergen Reactions  . Oxycodone Itching  . Atorvastatin Other (See Comments)  . Simvastatin Other (See Comments)    REACTION: muscle pain    Family History  Problem Relation Age of Onset  . Heart disease Father 52  . Breast cancer Mother   . Cancer Brother   . Alcohol abuse Other   . Arthritis Other   . Cancer Other        Breast, Prostate  . Coronary artery disease Other   . Irritable bowel syndrome Other   . Cystic fibrosis Other   . Colon cancer Neg Hx     Prior to Admission medications   Medication Sig Start Date End Date Taking? Authorizing Provider  aspirin EC 81 MG EC tablet Take 81 mg by mouth daily.    [provider]  EUTHYROX 100 MCG tablet Take 1 tablet by mouth once daily 01/03/21   Nani Ravens, Crosby Oyster, DO  lisinopril (ZESTRIL) 5 MG tablet TAKE 1 TABLET BY MOUTH ONCE DAILY . 04/14/20   [name], DO  loratadine (CLARITIN) 10 MG tablet Take 10 mg by mouth daily.    [provider]  Melatonin 5 MG CAPS Take 2 capsules by mouth daily.    [provider]  meloxicam (MOBIC) 7.5 MG tablet Take 1 tablet (7.5 mg total) by mouth daily. Patient not taking: Reported on 12/07/2020 11/20/20   [name], DO  omeprazole (PRILOSEC) 20 MG capsule Take 1 capsule (20 mg total) by mouth daily. 04/14/20   [name], DO  pravastatin (PRAVACHOL) 20 MG tablet Take 1 tablet (20 mg total) by mouth daily. 11/20/20   [name], DO  sodium bicarbonate 650 MG tablet  Take 1 tablet by mouth once daily 10/25/20   [name], DO    Physical Exam: Vitals:   01/16/21 1515 01/16/21 1600 01/16/21 1700 01/16/21 1900  BP: 113/79 120/77 120/74 118/80  Pulse: 66 (!) 59 60 64  Resp: 15 18 18 19   Temp:    97.8 F (36.6 C)  TempSrc:    Oral  SpO2: 94% 95% 96% 94%  Weight:      Height:         . General:  Appears calm and comfortable and is in NAD . Eyes:  PERRL, EOMI, normal lids, iris . ENT:  grossly normal hearing, lips & tongue, mmm; appropriate dentition . Neck:  no LAD, masses or thyromegaly . Cardiovascular:  RRR, no m/r/g. No LE edema.  Marland Kitchen Respiratory:   CTA bilaterally with no wheezes/rales/rhonchi.  Normal respiratory effort. . Abdomen:  soft, NT, ND, NABS . Skin:  no rash or induration seen on limited exam . Musculoskeletal:  grossly normal tone BUE/BLE, good ROM, no bony abnormality . Psychiatric:  grossly normal mood and affect, speech fluent and appropriate, AOx3 Neurologic:  CN 2-12 grossly intact, moves all extremities in coordinated fashion   Radiological Exams on Admission: Independently reviewed - see discussion  in A/P where applicable  DG Chest 2 View  Result Date: 01/15/2021 CLINICAL DATA:  70 year old male with chest pain. EXAM: CHEST - 2 VIEW COMPARISON:  Chest radiograph dated 07/23/2019. FINDINGS: There is mild chronic interstitial coarsening and bronchitic changes. No focal consolidation, pleural effusion, or pneumothorax. The cardiac silhouette is within limits. No acute osseous pathology. Degenerative changes of the spine. IMPRESSION: No active cardiopulmonary disease. Electronically Signed   By: Anner Crete M.D.   On: 01/15/2021 16:54   CARDIAC CATHETERIZATION  Addendum Date: 01/16/2021    Prox Cx to Mid Cx lesion is 30% stenosed.  Mid LAD lesion is 20% stenosed.  Mild nonobstructive CAD with a 20% smooth stenosis in the mid LAD; mild 30% focal stenosis in the mid circumflex vessel; and a normal dominant RCA.  Normal LV function with EF estimated 55 to 60% without focal segmental wall motion abnormalities.  LVEDP 13 mmHg. RECOMMENDATION: Medical therapy.  Aggressive lipid-lowering therapy with target LDL less than 70%.   Result Date: 01/16/2021  Prox Cx to Mid Cx lesion is 30% stenosed.  Mid LAD lesion is 20% stenosed.  Mild nonobstructive CAD with a 20% smooth stenosis in the mid LAD; mild 30% focal stenosis in the mid circumflex vessel; and a normal dominant RCA.  Normal LV function with EF estimated 55 to 60% without focal segmental wall motion abnormalities.  LVEDP 30 mmHg. RECOMMENDATION: Medical therapy.  Aggressive lipid-lowering therapy with target LDL less than 70%.   ECHOCARDIOGRAM COMPLETE  Result Date: 01/16/2021    ECHOCARDIOGRAM REPORT   Patient Name:   Zachary [name] Date of Exam: 01/16/2021 Medical Rec #:  [id number]         Height:       68.0 in Accession #:    [id number]        Weight:       200.8 lb Date of Birth:  1951-04-17        BSA:          2.047 m Patient Age:    74 years          BP:           113/79 mmHg Patient Gender: M  HR:           56 bpm.  Exam Location:  Inpatient Procedure: 2D Echo STAT ECHO Indications:    chest pain  History:        Patient has no prior history of Echocardiogram examinations.                 Risk Factors:Hypertension, Dyslipidemia and Sleep Apnea.  Sonographer:    Johny Chess Referring Phys: [place]  1. Left ventricular ejection fraction, by estimation, is 50 to 55%. The left ventricle has low normal function. The left ventricle has no regional wall motion abnormalities. Left ventricular diastolic parameters are consistent with Grade I diastolic dysfunction (impaired relaxation).  2. Right ventricular systolic function is normal. The right ventricular size is normal.  3. The mitral valve is normal in structure. Trivial mitral valve regurgitation. No evidence of mitral stenosis.  4. The aortic valve is tricuspid. Aortic valve regurgitation is trivial. Mild aortic valve sclerosis is present, with no evidence of aortic valve stenosis. FINDINGS  Left Ventricle: Left ventricular ejection fraction, by estimation, is 50 to 55%. The left ventricle has low normal function. The left ventricle has no regional wall motion abnormalities. The left ventricular internal cavity size was normal in size. There is no left ventricular hypertrophy. Left ventricular diastolic parameters are consistent with Grade I diastolic dysfunction (impaired relaxation). Right Ventricle: The right ventricular size is normal. Right ventricular systolic function is normal. Left Atrium: Left atrial size was normal in size. Right Atrium: Right atrial size was normal in size. Pericardium: There is no evidence of pericardial effusion. Mitral Valve: The mitral valve is normal in structure. Trivial mitral valve regurgitation. No evidence of mitral valve stenosis. Tricuspid Valve: The tricuspid valve is normal in structure. Tricuspid valve regurgitation is trivial. No evidence of tricuspid stenosis. Aortic Valve: The aortic valve is tricuspid.  Aortic valve regurgitation is trivial. Mild aortic valve sclerosis is present, with no evidence of aortic valve stenosis. Pulmonic Valve: The pulmonic valve was normal in structure. Pulmonic valve regurgitation is not visualized. No evidence of pulmonic stenosis. Aorta: The aortic root is normal in size and structure. Venous: The inferior vena cava was not well visualized.  LEFT VENTRICLE PLAX 2D LVIDd:         4.90 cm Diastology LVIDs:         3.10 cm LV e' medial:    7.40 cm/s LV PW:         1.10 cm LV E/e' medial:  8.5 LV IVS:        0.90 cm LV e' lateral:   9.57 cm/s                        LV E/e' lateral: 6.6  RIGHT VENTRICLE RV S prime:     11.70 cm/s TAPSE (M-mode): 2.3 cm LEFT ATRIUM             Index       RIGHT ATRIUM           Index LA diam:        3.50 cm 1.71 cm/m  RA Area:     15.50 cm LA Vol (A2C):   39.3 ml 19.20 ml/m RA Volume:   39.70 ml  19.39 ml/m LA Vol (A4C):   42.0 ml 20.51 ml/m LA Biplane Vol: 44.3 ml 21.64 ml/m  AORTIC VALVE LVOT Vmax:   76.40 cm/s LVOT Vmean:  47.900 cm/s  LVOT VTI:    0.179 m  AORTA Ao Asc diam: 3.30 cm MITRAL VALVE MV Area (PHT): 3.31 cm    SHUNTS MV Decel Time: 229 msec    Systemic VTI: 0.18 m MV E velocity: 63.00 cm/s MV A velocity: 77.10 cm/s MV E/A ratio:  0.82 Kirk Ruths MD Electronically signed by Kirk Ruths MD Signature Date/Time: 01/16/2021/4:20:02 PM    Final     EKG: Independently reviewed.  NSR with rate 63; nonspecific ST changes with no evidence of acute ischemia   Labs on Admission: I have personally reviewed the available labs and imaging studies at the time of the admission.  Pertinent labs:   Glucose 125 HS troponin 6, 7, 8 Normal CBC   Assessment/Plan Principal Problem:   Chest pain Active Problems:   Hypothyroidism   Essential hypertension   GERD   Prostate cancer (HCC)   OSA (obstructive sleep apnea)   Memory difficulty   Dyslipidemia (high LDL; low HDL)   DNR (do not resuscitate)    CP -Patient with  substernal chest pressure that came on with exertion and improved with NTG; still present periodically. -3/3 typical symptoms suggestive of typical cardiac chest pain.  -CXR unremarkable.   -Initial cardiac HS troponin negative and negative repeat x 2.  -EKG not indicative of acute ischemia.   -HEART pathway score is 7, indicating that the patient has an elevated risk score and requires further evaluation. -Will plan to place in observation status on telemetry to rule out ACS by overnight observation.  -Continue ASA 81 mg daily -Risk factor stratification with HgbA1c and FLP; will also check TSH and UDS -Cardiology consultation -He appears likely to benefit from cardiac catheterization -NPO for now  HTN -Continue Lisinopril -May benefit from addition of beta blocker  HLD -Check lipids -Consider transition from Pravachol to Lipitor  Hypothyroidism -Check TSH -Continue Synthroid at current dose for now  Mild cognitive impairment -Not particularly noticeable on exam -Wife serves as a strong advocate for him -Continue melatonin  OSA -Continue CPAP  Obesity -Body mass index is 30.54 kg/m..  -Weight loss should be encouraged -Outpatient PCP/bariatric medicine f/u encouraged  GERD -Stable on omeprazole and bicarb  Prostate cancer -In remission  DNR -I have discussed code status with the patient and his wife and they are in agreement that the patient would not desire resuscitation and would prefer to die a natural death should that situation arise. -He will need a gold out of facility DNR form at the time of discharge    Note: This patient has been tested and is negative for the novel coronavirus COVID-19. He has been fully vaccinated against COVID-19.   Level of care: Telemetry Cardiac DVT prophylaxis: Lovenox  Code Status:  DNR - confirmed with patient/wife Family Communication: Wife was present throughout evaluation Disposition Plan:  The patient is from:  home  Anticipated d/c is to: home without [facility] services once his cardiology issues have been resolved.  Anticipated d/c date will depend on clinical response to treatment, but possibly as early as tomorrow if he has excellent response to treatment  Patient is currently: acutely ill Consults called: Cardiology  Admission status: It is my clinical opinion that referral for OBSERVATION is reasonable and necessary in this patient based on the above information provided. The aforementioned taken together are felt to place the patient at high risk for further clinical deterioration. However it is anticipated that the patient may be medically stable for discharge from the hospital within  24 to 48 hours.    Karmen Bongo MD [facility]   How to contact the [facility] Attending or Consulting provider [place] or covering provider during after hours [place], for this patient?  1. Check the care team in [facility] and look for a) attending/consulting [facility] provider listed and b) the [facility] team listed 2. Log into [email or web address] and use [place] universal password to access. If you do not have the password, please contact the hospital operator. 3. Locate the [facility] provider you are looking for under [facility] and page to a number that you can be directly reached. 4. If you still have difficulty reaching the provider, please page the [facility] (Director on Call) for the Hospitalists listed on amion for assistance.   01/16/2021, 7:40 PM

## 2021-01-16 NOTE — ED Notes (Signed)
Pt began having 7/10 CP Mid Chest. (No other Symptoms or Radiating). Admitting Team made aware. Awaiting further orders.

## 2021-01-16 NOTE — ED Notes (Signed)
[facility] at Bedside. Report given to same. Transportation also made aware of recent medication administration. No Questions at this time.

## 2021-01-16 NOTE — Progress Notes (Addendum)
Cardiac cath results reviewed :  Conclusion    Prox Cx to Mid Cx lesion is 30% stenosed.  Mid LAD lesion is 20% stenosed.   Mild nonobstructive CAD with a 20% smooth stenosis in the mid LAD; mild 30% focal stenosis in the mid circumflex vessel; and a normal dominant RCA.    Normal LV function with EF estimated 55 to 60% without focal segmental wall motion abnormalities.  LVEDP 13-15 mmHg.  RECOMMENDATION: Medical therapy.  Aggressive lipid-lowering therapy with target LDL less than 70%.  No obstructive CAD.  Non cardiac CP.  If echo normal then Can go home on  low dose ACE I and increased dose of pravastatin 80mg  daily. Will need FLp and ALT in 6 weeks.  Followup in 1-2 weeks with PA.

## 2021-01-16 NOTE — H&P (View-Only) (Signed)
Cardiology Consultation:   Patient ID: Zachary [name] MRN: [id number]; DOB: 16-Feb-1951  Admit date: 01/15/2021 Date of Consult: 01/16/2021  Primary Care Provider: [name], DO [place] Cardiologist: None (NEW) [place] Electrophysiologist:  None    Patient Profile:   Zachary [name] is a 70 y.o. male with a hx of HLD, GERD, HTN who is being seen today for the evaluation of chest pain at the request of Karmen Bongo, MD.  History of Present Illness:   Zachary [name] is a 70yo male with a hx if HLD, GERD, HTN, TIA, Prostate CA, Hypothyroidism and OSA on CPAP and cardiac cath in 2011 with mild non obstructive dz of the LAD who was in his USOH until yesterday when he developed SSCP.  He had been out in his garage working on a scroll saw cutting some wood and has sudden onset of severe chest pain with radiation to the left arm associated with diaphoresis and SOB.  He had nausea but no vomiting.  He has a fm hx of CAD with his father dying of an MI so his wife brought him to the ER for evaluation.    In ER he was given NTG SL which eased pain after 3 tabs.  This am had severe chest pain again 7/10 and improved with NTGpaste but still having 3/10 CP.  He was given Morphine with improvement.  He had a cath in 2011 with non obstructive disease of the LAD and felt that CP was related to GERD.  He has never smoked.  EKG in ER showed NSR with no acute ST changes. hsATrop normal (6>7>8>6), LDL 90 and Scr 1.06. Cxray normal.     Past Medical History:  Diagnosis Date  . Arthritis   . Articular cartilage disease    left shoulder  . Diverticulitis   . Dyslipidemia (high LDL; low HDL) 08/15/2016  . GERD (gastroesophageal reflux disease)   . Hearing problem    hearing deficit  . Hemorrhoids   . Hypertension   . Hypothyroidism   . Prostate cancer ([place]) 2015   treated and in remission  . Sleep apnea    uses a cpap  . Wears glasses   . Wears hearing aid    both ears     Past Surgical History:  Procedure Laterality Date  . BACK SURGERY  1978   herniated disksurgery  . CARDIAC CATHETERIZATION  08/01/2010  . COLONOSCOPY    . EXAM UNDER ANESTHESIA WITH MANIPULATION OF SHOULDER Right 09/15/2014   Procedure: RIGHT SHOULDER MANIPULATION UNDER ANESTHESIA;  Surgeon: Ninetta Lights, MD;  Location: [facility];  Service: Orthopedics;  Laterality: Right;  . HEMORRHOID SURGERY  08/2006  . SHOULDER ARTHROSCOPY WITH DISTAL CLAVICLE RESECTION Left 07/07/2015   Procedure: SHOULDER ARTHROSCOPY WITH DISTAL CLAVICLE RESECTION;  Surgeon: Kathryne Hitch, MD;  Location: [facility];  Service: Orthopedics;  Laterality: Left;  . SHOULDER ARTHROSCOPY WITH ROTATOR CUFF REPAIR Left 12/07/2015   Procedure: LEFT SHOULDER ARTHROSCOPY DEBRIDEMENT, WITH ROTATOR CUFF REPAIR;  Surgeon: Ninetta Lights, MD;  Location: [facility];  Service: Orthopedics;  Laterality: Left;  . SHOULDER ARTHROSCOPY WITH ROTATOR CUFF REPAIR AND SUBACROMIAL DECOMPRESSION Left 07/07/2015   Procedure: LEFT SHOULDER SCOPE DEBRIDEMENT, SUBACROMIAL DECOMPRESSION, DISTAL CLAVICULECTOMY, ROTATOR CUFF REPAIR  ;  Surgeon: Kathryne Hitch, MD;  Location: [facility];  Service: Orthopedics;  Laterality: Left;  ANESTHESIA: GENERAL, PRE/POST OP SCALENE  . SHOULDER ARTHROSCOPY WITH SUBACROMIAL DECOMPRESSION, ROTATOR CUFF REPAIR  AND BICEP TENDON REPAIR Right 03/10/2014   Procedure: RIGHT SHOULDER ARTHROSCOPY WITH SUBACROMIAL DECOMPRESSION, PARTIAL ACROMIOPLASTY WITH CORACOAROMIAL LABRUM DEBRIDEMENT RELEASE DISTAL CLAVICULECTOMY,  ROTATOR CUFF REPAIR AND EXTENSIVE DEBRIDEMENT;  Surgeon: Ninetta Lights, MD;  Location: [facility];  Service: Orthopedics;  Laterality: Right;  . TENDON REPAIR  June 06, 2011   right elbow, Dr. Percell Miller     Home Medications:  Prior to Admission medications   Medication Sig Start Date End Date Taking? Authorizing Provider  aspirin EC 81  MG EC tablet Take 81 mg by mouth daily.   Yes [provider]  EUTHYROX 100 MCG tablet Take 1 tablet by mouth once daily Patient taking differently: Take 100 mcg by mouth daily before breakfast. 01/03/21  Yes Wendling, Crosby Oyster, DO  lisinopril (ZESTRIL) 5 MG tablet TAKE 1 TABLET BY MOUTH ONCE DAILY . Patient taking differently: Take 5 mg by mouth daily. 04/14/20  Yes [name], DO  loratadine (CLARITIN) 10 MG tablet Take 10 mg by mouth daily.   Yes [provider]  Melatonin 5 MG CAPS Take 10 mg by mouth daily.   Yes [provider]  omeprazole (PRILOSEC) 20 MG capsule Take 1 capsule (20 mg total) by mouth daily. 04/14/20  Yes [name], DO  pravastatin (PRAVACHOL) 20 MG tablet Take 1 tablet (20 mg total) by mouth daily. 11/20/20  Yes [name], DO  sodium bicarbonate 650 MG tablet Take 1 tablet by mouth once daily Patient taking differently: Take 650 mg by mouth daily. 10/25/20  Yes [name], DO    Inpatient Medications: Scheduled Meds: . aspirin  81 mg Oral Pre-Cath  . aspirin EC  81 mg Oral Daily  . enoxaparin (LOVENOX) injection  40 mg Subcutaneous Q24H  . levothyroxine  100 mcg Oral Q0600  . lisinopril  5 mg Oral Daily  . loratadine  10 mg Oral Daily  . melatonin  10 mg Oral QHS  . pantoprazole  40 mg Oral Daily  . pravastatin  20 mg Oral q1800  . sodium bicarbonate  650 mg Oral Daily   Continuous Infusions: . sodium chloride     Followed by  . sodium chloride     PRN Meds: acetaminophen, ondansetron (ZOFRAN) IV  Allergies:    Allergies  Allergen Reactions  . Oxycodone Itching  . Atorvastatin Other (See Comments)  . Simvastatin Other (See Comments)    REACTION: muscle pain    Social History:   Social History   Socioeconomic History  . Marital status: Married    Spouse name: Neoma Laming  . Number of children: 2  . Years of education: Not on file  . Highest education level: Not on  file  Occupational History  . 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 Smoking status: Never Smoker  . Smokeless tobacco: Never Used  Substance and Sexual Activity  . Alcohol use: Yes    Comment: occasional  . Drug use: No  . Sexual activity: Not on file  Other Topics Concern  . Not on file  Social History Narrative   Previously Chartered loss adjuster Paper   Social Determinants of Health   Financial Resource Strain: Not on file  Food Insecurity: Not on file  Transportation Needs: Not on file  Physical Activity: Not on file  Stress: Not on file  Social Connections: Not on file  Intimate  Partner Violence: Not on file    Family History:    Family History  Problem Relation Age of Onset  . Heart disease Father 69  . Breast cancer Mother   . Cancer Brother   . Alcohol abuse Other   . Arthritis Other   . Cancer Other        Breast, Prostate  . Coronary artery disease Other   . Irritable bowel syndrome Other   . Cystic fibrosis Other   . Colon cancer Neg Hx      ROS:  Please see the history of present illness.   All other ROS reviewed and negative.     Physical Exam/Data:   Vitals:   01/16/21 0545 01/16/21 0612 01/16/21 0701 01/16/21 1115  BP: 118/78 108/72 118/81 112/76  Pulse: 60 62 61 (!) 58  Resp: 20 14 16    Temp:  97.9 F (36.6 C) 98.2 F (36.8 C) (!) 97.5 F (36.4 C)  TempSrc:  Oral Oral Oral  SpO2: 97% 97% 99%   Weight:   91.1 kg   Height:   5\' 8"  (1.727 m)    No intake or output data in the 24 hours ending 01/16/21 1306 Last 3 Weights 01/16/2021 01/15/2021 11/20/2020  Weight (lbs) 200 lb 13.4 oz 201 lb 1 oz 201 lb  Weight (kg) 91.1 kg 91.2 kg 91.173 kg     Body mass index is 30.54 kg/m.  General:  Well nourished, well developed, in no acute distress HEENT: normal Lymph: no adenopathy Neck: no JVD Endocrine:  No thryomegaly Vascular: No  carotid bruits; FA pulses 2+ bilaterally without bruits  Cardiac:  normal S1, S2; RRR; no murmur  Lungs:  clear to auscultation bilaterally, no wheezing, rhonchi or rales  Abd: soft, nontender, no hepatomegaly  Ext: no edema Musculoskeletal:  No deformities, BUE and BLE strength normal and equal Skin: warm and dry  Neuro:  CNs 2-12 intact, no focal abnormalities noted Psych:  Normal affect   EKG:  The EKG was personally reviewed and demonstrates:  NSR with no ST changes Telemetry:  Telemetry was personally reviewed and demonstrates:  NSR  Relevant CV Studies: none  Laboratory Data:  High Sensitivity Troponin:   Recent Labs  Lab 01/15/21 1630 01/15/21 1828 01/15/21 2343 01/16/21 0726  TROPONINIHS 6 7 8 6      Chemistry Recent Labs  Lab 01/15/21 1630  NA 136  K 3.7  CL 100  CO2 27  GLUCOSE 125*  BUN 24*  CREATININE 1.06  CALCIUM 9.9  GFRNONAA >60  ANIONGAP 9    No results for input(s): PROT, ALBUMIN, AST, ALT, ALKPHOS, BILITOT in the last 168 hours. Hematology Recent Labs  Lab 01/15/21 1630  WBC 7.5  RBC 5.00  HGB 15.9  HCT 46.4  MCV 92.8  MCH 31.8  MCHC 34.3  RDW 12.6  PLT 252   BNPNo results for input(s): BNP, PROBNP in the last 168 hours.  DDimer No results for input(s): DDIMER in the last 168 hours.   Radiology/Studies:  DG Chest 2 View  Result Date: 01/15/2021 CLINICAL DATA:  70 year old male with chest pain. EXAM: CHEST - 2 VIEW COMPARISON:  Chest radiograph dated 07/23/2019. FINDINGS: There is mild chronic interstitial coarsening and bronchitic changes. No focal consolidation, pleural effusion, or pneumothorax. The cardiac silhouette is within limits. No acute osseous pathology. Degenerative changes of the spine. IMPRESSION: No active cardiopulmonary disease. Electronically Signed   By: Anner Crete M.D.   On: 01/15/2021 16:54  Assessment and Plan:   1. Unstable Angina -presented initially with episode of exertional angina after  cutting wood but now having episodes of rest pain since admit to hospital associated with diaphoresis and nitrate responsive -currently complains of 3/10 pain -EKG is non ischemia and hsTrop neg x 4.  -his sx are very concerning for coronary ischemia and he had non obstructive CAD on cath in 2011 -recommend proceeding with cardiac cath -Cardiac catheterization was discussed with the patient fully. The patient understands that risks include but are not limited to stroke (1 in 1000), death (1 in 63), kidney failure [usually temporary] (1 in 500), bleeding (1 in 200), allergic reaction [possibly serious] (1 in 200).  The patient understands and is willing to proceed.   -continue ASA, NTG paste, statin  2.  ASCAD -mild non obstructive by cath in 2011 and CP felt to be GERD at that time -continue ASA and statin  3.  HLD -LDL goal < 70 -LDL was 90 this admit -intolerant to atorvastatin and simvastatin -stop pravastatin and change to Crestor 20mg  daily -will need FLp and ALT in 6 weeks  4.  HTN -BP controlled one xam -continue Lisinopril 5mg  daily     TIMI Risk Score for Unstable Angina or Non-ST Elevation MI:   The patient's TIMI risk score is 4, which indicates a 20% risk of all cause mortality, new or recurrent myocardial infarction or need for urgent revascularization in the next 14 days.  For questions or updates, please contact [place] Please consult [email or web address] for contact info under    Signed, Fransico Him, MD  01/16/2021 1:06 PM

## 2021-01-16 NOTE — Progress Notes (Signed)
  Echocardiogram 2D Echocardiogram has been performed.  Zachary [name] 01/16/2021, 4:14 PM

## 2021-01-16 NOTE — Consult Note (Addendum)
Cardiology Consultation:   Patient ID: [name] MRN: [id number]; DOB: 05/10/1951  Admit date: 01/15/2021 Date of Consult: 01/16/2021  Primary Care Provider: [name], DO [place] Cardiologist: None (NEW) [place] Electrophysiologist:  None    Patient Profile:   Zachary [name] is a 70 y.o. male with a hx of HLD, GERD, HTN who is being seen today for the evaluation of chest pain at the request of Karmen Bongo, MD.  History of Present Illness:   [name] is a 70yo male with a hx if HLD, GERD, HTN, TIA, Prostate CA, Hypothyroidism and OSA on CPAP and cardiac cath in 2011 with mild non obstructive dz of the LAD who was in his USOH until yesterday when he developed SSCP.  He had been out in his garage working on a scroll saw cutting some wood and has sudden onset of severe chest pain with radiation to the left arm associated with diaphoresis and SOB.  He had nausea but no vomiting.  He has a fm hx of CAD with his father dying of an MI so his wife brought him to the ER for evaluation.    In ER he was given NTG SL which eased pain after 3 tabs.  This am had severe chest pain again 7/10 and improved with NTGpaste but still having 3/10 CP.  He was given Morphine with improvement.  He had a cath in 2011 with non obstructive disease of the LAD and felt that CP was related to GERD.  He has never smoked.  EKG in ER showed NSR with no acute ST changes. hsATrop normal (6>7>8>6), LDL 90 and Scr 1.06. Cxray normal.     Past Medical History:  Diagnosis Date  . Arthritis   . Articular cartilage disease    left shoulder  . Diverticulitis   . Dyslipidemia (high LDL; low HDL) 08/15/2016  . GERD (gastroesophageal reflux disease)   . Hearing problem    hearing deficit  . Hemorrhoids   . Hypertension   . Hypothyroidism   . Prostate cancer ([place]) 2015   treated and in remission  . Sleep apnea    uses a cpap  . Wears glasses   . Wears hearing aid    both ears     Past Surgical History:  Procedure Laterality Date  . BACK SURGERY  1978   herniated disksurgery  . CARDIAC CATHETERIZATION  08/01/2010  . COLONOSCOPY    . EXAM UNDER ANESTHESIA WITH MANIPULATION OF SHOULDER Right 09/15/2014   Procedure: RIGHT SHOULDER MANIPULATION UNDER ANESTHESIA;  Surgeon: Ninetta Lights, MD;  Location: [facility];  Service: Orthopedics;  Laterality: Right;  . HEMORRHOID SURGERY  08/2006  . SHOULDER ARTHROSCOPY WITH DISTAL CLAVICLE RESECTION Left 07/07/2015   Procedure: SHOULDER ARTHROSCOPY WITH DISTAL CLAVICLE RESECTION;  Surgeon: Kathryne Hitch, MD;  Location: [facility];  Service: Orthopedics;  Laterality: Left;  . SHOULDER ARTHROSCOPY WITH ROTATOR CUFF REPAIR Left 12/07/2015   Procedure: LEFT SHOULDER ARTHROSCOPY DEBRIDEMENT, WITH ROTATOR CUFF REPAIR;  Surgeon: Ninetta Lights, MD;  Location: [facility];  Service: Orthopedics;  Laterality: Left;  . SHOULDER ARTHROSCOPY WITH ROTATOR CUFF REPAIR AND SUBACROMIAL DECOMPRESSION Left 07/07/2015   Procedure: LEFT SHOULDER SCOPE DEBRIDEMENT, SUBACROMIAL DECOMPRESSION, DISTAL CLAVICULECTOMY, ROTATOR CUFF REPAIR  ;  Surgeon: Kathryne Hitch, MD;  Location: [facility];  Service: Orthopedics;  Laterality: Left;  ANESTHESIA: GENERAL, PRE/POST OP SCALENE  . SHOULDER ARTHROSCOPY WITH SUBACROMIAL DECOMPRESSION, ROTATOR CUFF REPAIR  AND BICEP TENDON REPAIR Right 03/10/2014   Procedure: RIGHT SHOULDER ARTHROSCOPY WITH SUBACROMIAL DECOMPRESSION, PARTIAL ACROMIOPLASTY WITH CORACOAROMIAL LABRUM DEBRIDEMENT RELEASE DISTAL CLAVICULECTOMY,  ROTATOR CUFF REPAIR AND EXTENSIVE DEBRIDEMENT;  Surgeon: Ninetta Lights, MD;  Location: [facility];  Service: Orthopedics;  Laterality: Right;  . TENDON REPAIR  June 06, 2011   right elbow, Dr. Percell Miller     Home Medications:  Prior to Admission medications   Medication Sig Start Date End Date Taking? Authorizing Provider  aspirin EC 81  MG EC tablet Take 81 mg by mouth daily.   Yes [provider]  EUTHYROX 100 MCG tablet Take 1 tablet by mouth once daily Patient taking differently: Take 100 mcg by mouth daily before breakfast. 01/03/21  Yes Wendling, Crosby Oyster, DO  lisinopril (ZESTRIL) 5 MG tablet TAKE 1 TABLET BY MOUTH ONCE DAILY . Patient taking differently: Take 5 mg by mouth daily. 04/14/20  Yes [name], DO  loratadine (CLARITIN) 10 MG tablet Take 10 mg by mouth daily.   Yes [provider]  Melatonin 5 MG CAPS Take 10 mg by mouth daily.   Yes [provider]  omeprazole (PRILOSEC) 20 MG capsule Take 1 capsule (20 mg total) by mouth daily. 04/14/20  Yes [name], DO  pravastatin (PRAVACHOL) 20 MG tablet Take 1 tablet (20 mg total) by mouth daily. 11/20/20  Yes [name], DO  sodium bicarbonate 650 MG tablet Take 1 tablet by mouth once daily Patient taking differently: Take 650 mg by mouth daily. 10/25/20  Yes [name], DO    Inpatient Medications: Scheduled Meds: . aspirin  81 mg Oral Pre-Cath  . aspirin EC  81 mg Oral Daily  . enoxaparin (LOVENOX) injection  40 mg Subcutaneous Q24H  . levothyroxine  100 mcg Oral Q0600  . lisinopril  5 mg Oral Daily  . loratadine  10 mg Oral Daily  . melatonin  10 mg Oral QHS  . pantoprazole  40 mg Oral Daily  . pravastatin  20 mg Oral q1800  . sodium bicarbonate  650 mg Oral Daily   Continuous Infusions: . sodium chloride     Followed by  . sodium chloride     PRN Meds: acetaminophen, ondansetron (ZOFRAN) IV  Allergies:    Allergies  Allergen Reactions  . Oxycodone Itching  . Atorvastatin Other (See Comments)  . Simvastatin Other (See Comments)    REACTION: muscle pain    Social History:   Social History   Socioeconomic History  . Marital status: Married    Spouse name: Neoma Laming  . Number of children: 2  . Years of education: Not on file  . Highest education level: Not on  file  Occupational History  . 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 Smoking status: Never Smoker  . Smokeless tobacco: Never Used  Substance and Sexual Activity  . Alcohol use: Yes    Comment: occasional  . Drug use: No  . Sexual activity: Not on file  Other Topics Concern  . Not on file  Social History Narrative   Previously Chartered loss adjuster [employer]   Social Determinants of Health   Financial Resource Strain: Not on file  Food Insecurity: Not on file  Transportation Needs: Not on file  Physical Activity: Not on file  Stress: Not on file  Social Connections: Not on file  Intimate  Partner Violence: Not on file    Family History:    Family History  Problem Relation Age of Onset  . Heart disease Father 69  . Breast cancer Mother   . Cancer Brother   . Alcohol abuse Other   . Arthritis Other   . Cancer Other        Breast, Prostate  . Coronary artery disease Other   . Irritable bowel syndrome Other   . Cystic fibrosis Other   . Colon cancer Neg Hx      ROS:  Please see the history of present illness.   All other ROS reviewed and negative.     Physical Exam/Data:   Vitals:   01/16/21 0545 01/16/21 0612 01/16/21 0701 01/16/21 1115  BP: 118/78 108/72 118/81 112/76  Pulse: 60 62 61 (!) 58  Resp: 20 14 16    Temp:  97.9 F (36.6 C) 98.2 F (36.8 C) (!) 97.5 F (36.4 C)  TempSrc:  Oral Oral Oral  SpO2: 97% 97% 99%   Weight:   91.1 kg   Height:   5\' 8"  (1.727 m)    No intake or output data in the 24 hours ending 01/16/21 1306 Last 3 Weights 01/16/2021 01/15/2021 11/20/2020  Weight (lbs) 200 lb 13.4 oz 201 lb 1 oz 201 lb  Weight (kg) 91.1 kg 91.2 kg 91.173 kg     Body mass index is 30.54 kg/m.  General:  Well nourished, well developed, in no acute distress HEENT: normal Lymph: no adenopathy Neck: no JVD Endocrine:  No thryomegaly Vascular: No  carotid bruits; FA pulses 2+ bilaterally without bruits  Cardiac:  normal S1, S2; RRR; no murmur  Lungs:  clear to auscultation bilaterally, no wheezing, rhonchi or rales  Abd: soft, nontender, no hepatomegaly  Ext: no edema Musculoskeletal:  No deformities, BUE and BLE strength normal and equal Skin: warm and dry  Neuro:  CNs 2-12 intact, no focal abnormalities noted Psych:  Normal affect   EKG:  The EKG was personally reviewed and demonstrates:  NSR with no ST changes Telemetry:  Telemetry was personally reviewed and demonstrates:  NSR  Relevant CV Studies: none  Laboratory Data:  High Sensitivity Troponin:   Recent Labs  Lab 01/15/21 1630 01/15/21 1828 01/15/21 2343 01/16/21 0726  TROPONINIHS 6 7 8 6      Chemistry Recent Labs  Lab 01/15/21 1630  NA 136  K 3.7  CL 100  CO2 27  GLUCOSE 125*  BUN 24*  CREATININE 1.06  CALCIUM 9.9  GFRNONAA >60  ANIONGAP 9    No results for input(s): PROT, ALBUMIN, AST, ALT, ALKPHOS, BILITOT in the last 168 hours. Hematology Recent Labs  Lab 01/15/21 1630  WBC 7.5  RBC 5.00  HGB 15.9  HCT 46.4  MCV 92.8  MCH 31.8  MCHC 34.3  RDW 12.6  PLT 252   BNPNo results for input(s): BNP, PROBNP in the last 168 hours.  DDimer No results for input(s): DDIMER in the last 168 hours.   Radiology/Studies:  DG Chest 2 View  Result Date: 01/15/2021 CLINICAL DATA:  70 year old male with chest pain. EXAM: CHEST - 2 VIEW COMPARISON:  Chest radiograph dated 07/23/2019. FINDINGS: There is mild chronic interstitial coarsening and bronchitic changes. No focal consolidation, pleural effusion, or pneumothorax. The cardiac silhouette is within limits. No acute osseous pathology. Degenerative changes of the spine. IMPRESSION: No active cardiopulmonary disease. Electronically Signed   By: Anner Crete M.D.   On: 01/15/2021 16:54  Assessment and Plan:   1. Unstable Angina -presented initially with episode of exertional angina after  cutting wood but now having episodes of rest pain since admit to hospital associated with diaphoresis and nitrate responsive -currently complains of 3/10 pain -EKG is non ischemia and hsTrop neg x 4.  -his sx are very concerning for coronary ischemia and he had non obstructive CAD on cath in 2011 -recommend proceeding with cardiac cath -Cardiac catheterization was discussed with the patient fully. The patient understands that risks include but are not limited to stroke (1 in 1000), death (1 in 69), kidney failure [usually temporary] (1 in 500), bleeding (1 in 200), allergic reaction [possibly serious] (1 in 200).  The patient understands and is willing to proceed.   -continue ASA, NTG paste, statin  2.  ASCAD -mild non obstructive by cath in 2011 and CP felt to be GERD at that time -continue ASA and statin  3.  HLD -LDL goal < 70 -LDL was 90 this admit -intolerant to atorvastatin and simvastatin and had confusion and diarrhea with crestor -increase pravastatin to 80mg  daily -will need FLp and ALT in 6 weeks  4.  HTN -BP controlled one xam -continue Lisinopril 5mg  daily     TIMI Risk Score for Unstable Angina or Non-ST Elevation MI:   The patient's TIMI risk score is 4, which indicates a 20% risk of all cause mortality, new or recurrent myocardial infarction or need for urgent revascularization in the next 14 days.  For questions or updates, please contact [place] Please consult [email or web address] for contact info under    Signed, Fransico Him, MD  01/16/2021 1:06 PM

## 2021-01-17 ENCOUNTER — Encounter (HOSPITAL_COMMUNITY): Payer: Self-pay | Admitting: Cardiovascular Disease

## 2021-01-17 DIAGNOSIS — G4733 Obstructive sleep apnea (adult) (pediatric): Secondary | ICD-10-CM | POA: Diagnosis not present

## 2021-01-17 DIAGNOSIS — I2510 Atherosclerotic heart disease of native coronary artery without angina pectoris: Secondary | ICD-10-CM | POA: Diagnosis not present

## 2021-01-17 DIAGNOSIS — E785 Hyperlipidemia, unspecified: Secondary | ICD-10-CM | POA: Diagnosis not present

## 2021-01-17 DIAGNOSIS — R079 Chest pain, unspecified: Secondary | ICD-10-CM | POA: Diagnosis not present

## 2021-01-17 DIAGNOSIS — Z66 Do not resuscitate: Secondary | ICD-10-CM | POA: Diagnosis not present

## 2021-01-17 DIAGNOSIS — Z79899 Other long term (current) drug therapy: Secondary | ICD-10-CM | POA: Diagnosis not present

## 2021-01-17 DIAGNOSIS — K219 Gastro-esophageal reflux disease without esophagitis: Secondary | ICD-10-CM | POA: Diagnosis not present

## 2021-01-17 DIAGNOSIS — E039 Hypothyroidism, unspecified: Secondary | ICD-10-CM | POA: Diagnosis not present

## 2021-01-17 DIAGNOSIS — C61 Malignant neoplasm of prostate: Secondary | ICD-10-CM | POA: Diagnosis not present

## 2021-01-17 DIAGNOSIS — Z20822 Contact with and (suspected) exposure to covid-19: Secondary | ICD-10-CM | POA: Diagnosis not present

## 2021-01-17 DIAGNOSIS — I2583 Coronary atherosclerosis due to lipid rich plaque: Secondary | ICD-10-CM | POA: Diagnosis not present

## 2021-01-17 DIAGNOSIS — Z7982 Long term (current) use of aspirin: Secondary | ICD-10-CM | POA: Diagnosis not present

## 2021-01-17 DIAGNOSIS — R0789 Other chest pain: Secondary | ICD-10-CM | POA: Diagnosis not present

## 2021-01-17 DIAGNOSIS — R413 Other amnesia: Secondary | ICD-10-CM | POA: Diagnosis not present

## 2021-01-17 DIAGNOSIS — I200 Unstable angina: Secondary | ICD-10-CM | POA: Diagnosis not present

## 2021-01-17 DIAGNOSIS — I10 Essential (primary) hypertension: Secondary | ICD-10-CM | POA: Diagnosis not present

## 2021-01-17 LAB — BASIC METABOLIC PANEL
Anion gap: 9 (ref 5–15)
BUN: 15 mg/dL (ref 8–23)
CO2: 23 mmol/L (ref 22–32)
Calcium: 9.3 mg/dL (ref 8.9–10.3)
Chloride: 106 mmol/L (ref 98–111)
Creatinine, Ser: 0.99 mg/dL (ref 0.61–1.24)
GFR, Estimated: >60 mL/min (ref >60)
Glucose, Bld: 101 mg/dL — ABNORMAL HIGH (ref 70–99)
Potassium: 4.2 mmol/L (ref 3.5–5.1)
Sodium: 138 mmol/L (ref 135–145)

## 2021-01-17 LAB — CBC
HCT: 42.9 % (ref 39.0–52.0)
Hemoglobin: 14.5 g/dL (ref 13.0–17.0)
MCH: 31.1 pg (ref 26.0–34.0)
MCHC: 33.8 g/dL (ref 30.0–36.0)
MCV: 92.1 fL (ref 80.0–100.0)
Platelets: 218 10*3/uL (ref 150–400)
RBC: 4.66 MIL/uL (ref 4.22–5.81)
RDW: 12.7 % (ref 11.5–15.5)
WBC: 6.7 10*3/uL (ref 4.0–10.5)
nRBC: 0 % (ref 0.0–0.2)

## 2021-01-17 MED ORDER — PRAVASTATIN SODIUM 80 MG PO TABS: 80.0000 mg | ORAL_TABLET | Freq: Every day | ORAL | 1 refills | Status: DC

## 2021-01-17 NOTE — Discharge Summary (Signed)
Physician Discharge Summary  Zachary [name] URK:[id number] DOB: 07/04/1951 DOA: 01/15/2021  PCP: Zachary Pal, DO  Admit date: 01/15/2021 Discharge date: 01/17/2021  Admitted From: Home Disposition:  Home  Discharge Condition:Stable CODE STATUS:DNR Diet recommendation: Heart Healthy    Brief/Interim Summary:  HPI: Zachary [name] is a 70 y.o. male with medical history significant of OSA on CPAP; hypothyroidism; HTN; HLD; TIA/mild cognitive impairment; and prostate CA presenting with chest pain.  He developed chest pain yesterday PM. He came into the house about 430PM.  He had symptoms about 30 minutes prior, was clammy, and then developed L arm radiation.  His dad died of an MI at about the same age and so his wife took him to the ER.  Substernal pain, started with sawing on a scroll saw (?exertional).   It continues to come and go and was present this AM but not now.  NTG eased the pain after 3 doses and then it recurred and they placed NTG paste.  He had 7/10 pain this AM and was given morphine with resolution of the pain.  Last catheterization about 10 years ago and he saw Dr. Stanford Breed; he was started on Prilosec and sodium bicarbonate with stabilization of GERD.  No further cardiac evaluation since.  Hospital course:  His hospital course remained stable.  He was consulted by cardiology after admission.  Patient underwent cardiac cath which showed mild nonobstructive coronary disease.  Echocardiogram showed ejection fraction of 55 to 60%.  Plan is to start on medical management.  He has been started on pravastatin 80 mg daily.  He will continue aspirin which he was taking at home.  Continue lisinopril at 5 mg daily. Patient seen and examined at bedside this morning.  He is hemodynamically stable denies any chest pain.  Cardiology has cleared him for discharge.  He will be called for appointment as an outpatient.  We will recommend him to do a lipid panel and LFTs in 6 weeks. I  discussed the plan with his wife by calling her on phone.    Discharge Diagnoses:  Principal Problem:   Chest pain Active Problems:   Hypothyroidism   Essential hypertension   GERD   Prostate cancer (HCC)   OSA (obstructive sleep apnea)   Memory difficulty   Dyslipidemia (high LDL; low HDL)   DNR (do not resuscitate)    Discharge Instructions  Discharge Instructions    Diet - low sodium heart healthy   Complete by: As directed    Discharge instructions   Complete by: As directed    1)Please follow up with your PCP in a week 2)Do lipid panel and liver function tests in 6 weeks 3)Take prescribed medications as instructed 4)Follow up with cardiology as an outpatient.  You will be called for  appointment.   Increase activity slowly   Complete by: As directed      Allergies as of 01/17/2021      Reactions   Oxycodone Itching   Atorvastatin Other (See Comments)   Simvastatin Other (See Comments)   REACTION: muscle pain      Medication List    TAKE these medications   aspirin EC 81 MG tablet Take 81 mg by mouth daily.   Euthyrox 100 MCG tablet Generic drug: levothyroxine Take 1 tablet by mouth once daily What changed:   how much to take  when to take this   lisinopril 5 MG tablet Commonly known as: ZESTRIL TAKE 1 TABLET BY MOUTH ONCE DAILY .  What changed:   how much to take  how to take this  when to take this  additional instructions   loratadine 10 MG tablet Commonly known as: CLARITIN Take 10 mg by mouth daily.   Melatonin 5 MG Caps Take 10 mg by mouth daily.   omeprazole 20 MG capsule Commonly known as: PRILOSEC Take 1 capsule (20 mg total) by mouth daily.   pravastatin 80 MG tablet Commonly known as: PRAVACHOL Take 1 tablet (80 mg total) by mouth daily at 6 PM. What changed:   medication strength  how much to take  when to take this   sodium bicarbonate 650 MG tablet Take 1 tablet by mouth once daily       Follow-up  Information    Zachary Pal, DO. Schedule an appointment as soon as possible for a visit in 1 week(s).   Specialty: Family Medicine Contact information: [address] [place] [address] [phone]              Allergies  Allergen Reactions  . Oxycodone Itching  . Atorvastatin Other (See Comments)  . Simvastatin Other (See Comments)    REACTION: muscle pain    Consultations:  Cardiology   Procedures/Studies: DG Chest 2 View  Result Date: 01/15/2021 CLINICAL DATA:  70 year old male with chest pain. EXAM: CHEST - 2 VIEW COMPARISON:  Chest radiograph dated 07/23/2019. FINDINGS: There is mild chronic interstitial coarsening and bronchitic changes. No focal consolidation, pleural effusion, or pneumothorax. The cardiac silhouette is within limits. No acute osseous pathology. Degenerative changes of the spine. IMPRESSION: No active cardiopulmonary disease. Electronically Signed   By: Anner Crete M.D.   On: 01/15/2021 16:54   CARDIAC CATHETERIZATION  Addendum Date: 01/16/2021    Prox Cx to Mid Cx lesion is 30% stenosed.  Mid LAD lesion is 20% stenosed.  Mild nonobstructive CAD with a 20% smooth stenosis in the mid LAD; mild 30% focal stenosis in the mid circumflex vessel; and a normal dominant RCA.  Normal LV function with EF estimated 55 to 60% without focal segmental wall motion abnormalities.  LVEDP 13 mmHg. RECOMMENDATION: Medical therapy.  Aggressive lipid-lowering therapy with target LDL less than 70%.   Result Date: 01/16/2021  Prox Cx to Mid Cx lesion is 30% stenosed.  Mid LAD lesion is 20% stenosed.  Mild nonobstructive CAD with a 20% smooth stenosis in the mid LAD; mild 30% focal stenosis in the mid circumflex vessel; and a normal dominant RCA.  Normal LV function with EF estimated 55 to 60% without focal segmental wall motion abnormalities.  LVEDP 30 mmHg. RECOMMENDATION: Medical therapy.  Aggressive lipid-lowering therapy with target  LDL less than 70%.   ECHOCARDIOGRAM COMPLETE  Result Date: 01/16/2021    ECHOCARDIOGRAM REPORT   Patient Name:   Zachary [name] Date of Exam: 01/16/2021 Medical Rec #:  [id number]         Height:       68.0 in Accession #:    [id number]        Weight:       200.8 lb Date of Birth:  1951/06/05        BSA:          2.047 m Patient Age:    35 years          BP:           113/79 mmHg Patient Gender: M  HR:           56 bpm. Exam Location:  Inpatient Procedure: 2D Echo STAT ECHO Indications:    chest pain  History:        Patient has no prior history of Echocardiogram examinations.                 Risk Factors:Hypertension, Dyslipidemia and Sleep Apnea.  Sonographer:    Johny Chess Referring Phys: [place]  1. Left ventricular ejection fraction, by estimation, is 50 to 55%. The left ventricle has low normal function. The left ventricle has no regional wall motion abnormalities. Left ventricular diastolic parameters are consistent with Grade I diastolic dysfunction (impaired relaxation).  2. Right ventricular systolic function is normal. The right ventricular size is normal.  3. The mitral valve is normal in structure. Trivial mitral valve regurgitation. No evidence of mitral stenosis.  4. The aortic valve is tricuspid. Aortic valve regurgitation is trivial. Mild aortic valve sclerosis is present, with no evidence of aortic valve stenosis. FINDINGS  Left Ventricle: Left ventricular ejection fraction, by estimation, is 50 to 55%. The left ventricle has low normal function. The left ventricle has no regional wall motion abnormalities. The left ventricular internal cavity size was normal in size. There is no left ventricular hypertrophy. Left ventricular diastolic parameters are consistent with Grade I diastolic dysfunction (impaired relaxation). Right Ventricle: The right ventricular size is normal. Right ventricular systolic function is normal. Left Atrium: Left atrial size  was normal in size. Right Atrium: Right atrial size was normal in size. Pericardium: There is no evidence of pericardial effusion. Mitral Valve: The mitral valve is normal in structure. Trivial mitral valve regurgitation. No evidence of mitral valve stenosis. Tricuspid Valve: The tricuspid valve is normal in structure. Tricuspid valve regurgitation is trivial. No evidence of tricuspid stenosis. Aortic Valve: The aortic valve is tricuspid. Aortic valve regurgitation is trivial. Mild aortic valve sclerosis is present, with no evidence of aortic valve stenosis. Pulmonic Valve: The pulmonic valve was normal in structure. Pulmonic valve regurgitation is not visualized. No evidence of pulmonic stenosis. Aorta: The aortic root is normal in size and structure. Venous: The inferior vena cava was not well visualized.  LEFT VENTRICLE PLAX 2D LVIDd:         4.90 cm Diastology LVIDs:         3.10 cm LV e' medial:    7.40 cm/s LV PW:         1.10 cm LV E/e' medial:  8.5 LV IVS:        0.90 cm LV e' lateral:   9.57 cm/s                        LV E/e' lateral: 6.6  RIGHT VENTRICLE RV S prime:     11.70 cm/s TAPSE (M-mode): 2.3 cm LEFT ATRIUM             Index       RIGHT ATRIUM           Index LA diam:        3.50 cm 1.71 cm/m  RA Area:     15.50 cm LA Vol (A2C):   39.3 ml 19.20 ml/m RA Volume:   39.70 ml  19.39 ml/m LA Vol (A4C):   42.0 ml 20.51 ml/m LA Biplane Vol: 44.3 ml 21.64 ml/m  AORTIC VALVE LVOT Vmax:   76.40 cm/s LVOT Vmean:  47.900 cm/s LVOT  VTI:    0.179 m  AORTA Ao Asc diam: 3.30 cm MITRAL VALVE MV Area (PHT): 3.31 cm    SHUNTS MV Decel Time: 229 msec    Systemic VTI: 0.18 m MV E velocity: 63.00 cm/s MV A velocity: 77.10 cm/s MV E/A ratio:  0.82 Kirk Ruths MD Electronically signed by Kirk Ruths MD Signature Date/Time: 01/16/2021/4:20:02 PM    Final        Subjective:  Patient seen and examined the bedside this morning.  He is medically stable for discharge home today. Discharge Exam: Vitals:    01/17/21 0334 01/17/21 0806  BP: 121/83 118/74  Pulse: 61 74  Resp: 17 20  Temp: 98 F (36.7 C) 97.9 F (36.6 C)  SpO2: 95% 94%   Vitals:   01/16/21 2033 01/17/21 0000 01/17/21 0334 01/17/21 0806  BP:  125/83 121/83 118/74  Pulse: 61 (!) 58 61 74  Resp: 18 15 17 20   Temp:  98.7 F (37.1 C) 98 F (36.7 C) 97.9 F (36.6 C)  TempSrc:  Oral Oral Oral  SpO2: 96% 95% 95% 94%  Weight:      Height:        General: Pt is alert, awake, not in acute distress Cardiovascular: RRR, S1/S2 +, no rubs, no gallops Respiratory: CTA bilaterally, no wheezing, no rhonchi Abdominal: Soft, NT, ND, bowel sounds + Extremities: no edema, no cyanosis    The results of significant diagnostics from this hospitalization (including imaging, microbiology, ancillary and laboratory) are listed below for reference.     Microbiology: Recent Results (from the past 240 hour(s))  SARS CORONAVIRUS 2 (TAT 6-24 HRS) Nasopharyngeal Nasopharyngeal Swab     Status: None   Collection Time: 01/15/21  7:54 PM   Specimen: Nasopharyngeal Swab  Result Value Ref Range Status   SARS Coronavirus 2 NEGATIVE NEGATIVE Final    Comment: (NOTE) SARS-CoV-2 target nucleic acids are NOT DETECTED.  The SARS-CoV-2 RNA is generally detectable in upper and lower respiratory specimens during the acute phase of infection. Negative results do not preclude SARS-CoV-2 infection, do not rule out co-infections with other pathogens, and should not be used as the sole basis for treatment or other patient management decisions. Negative results must be combined with clinical observations, patient history, and epidemiological information. The expected result is Negative.  Fact Sheet for Patients: SugarRoll.be  Fact Sheet for Healthcare Providers: https://www.woods-mathews.com/  This test is not yet approved or cleared by the [place] FDA and  has been authorized for detection and/or  diagnosis of SARS-CoV-2 by FDA under an Emergency Use Authorization (EUA). This EUA will remain  in effect (meaning this test can be used) for the duration of the COVID-19 declaration under Se ction 564(b)(1) of the Act, 21 U.S.C. section 360bbb-3(b)(1), unless the authorization is terminated or revoked sooner.  Performed at [facility], [place] [address] [place], [place] [address]   MRSA PCR Screening     Status: None   Collection Time: 01/16/21  7:58 AM   Specimen: Nasopharyngeal  Result Value Ref Range Status   MRSA by PCR NEGATIVE NEGATIVE Final    Comment:        The GeneXpert MRSA Assay (FDA approved for NASAL specimens only), is one component of a comprehensive MRSA colonization surveillance program. It is not intended to diagnose MRSA infection nor to guide or monitor treatment for MRSA infections. Performed at [facility], [place] [address] [place], [place] [address]  Labs: BNP (last 3 results) No results for input(s): BNP in the last 8760 hours. Basic Metabolic Panel: Recent Labs  Lab 01/15/21 1630 01/17/21 0048  NA 136 138  K 3.7 4.2  CL 100 106  CO2 27 23  GLUCOSE 125* 101*  BUN 24* 15  CREATININE 1.06 0.99  CALCIUM 9.9 9.3   Liver Function Tests: No results for input(s): AST, ALT, ALKPHOS, BILITOT, PROT, ALBUMIN in the last 168 hours. No results for input(s): LIPASE, AMYLASE in the last 168 hours. No results for input(s): AMMONIA in the last 168 hours. CBC: Recent Labs  Lab 01/15/21 1630 01/17/21 0048  WBC 7.5 6.7  HGB 15.9 14.5  HCT 46.4 42.9  MCV 92.8 92.1  PLT 252 218   Cardiac Enzymes: No results for input(s): CKTOTAL, CKMB, CKMBINDEX, TROPONINI in the last 168 hours. BNP: Invalid input(s): POCBNP CBG: No results for input(s): GLUCAP in the last 168 hours. D-Dimer No results for input(s): DDIMER in the last 72 hours. Hgb A1c Recent Labs    01/16/21 0726  HGBA1C 5.8*   Lipid Profile Recent Labs     01/16/21 0730  CHOL 144  HDL 34*  LDLCALC 90  TRIG 100  CHOLHDL 4.2   Thyroid function studies Recent Labs    01/16/21 0726  TSH 3.656   Anemia work up No results for input(s): VITAMINB12, FOLATE, FERRITIN, TIBC, IRON, RETICCTPCT in the last 72 hours. Urinalysis    Component Value Date/Time   COLORURINE LT. YELLOW 03/10/2009 0830   APPEARANCEUR CLEAR 03/10/2009 0830   LABSPEC 1.010 03/10/2009 0830   PHURINE 5.5 03/10/2009 0830   GLUCOSEU NEGATIVE 03/10/2009 0830   HGBUR negative 03/10/2009 0800   BILIRUBINUR neg 03/24/2017 0724   KETONESUR NEGATIVE 03/10/2009 0830   PROTEINUR neg 03/24/2017 0724   UROBILINOGEN 2.0 (A) 03/24/2017 0724   UROBILINOGEN 0.2 03/10/2009 0830   NITRITE neg 03/24/2017 0724   NITRITE NEGATIVE 03/10/2009 0830   LEUKOCYTESUR Negative 03/24/2017 0724   Sepsis Labs Invalid input(s): PROCALCITONIN,  WBC,  LACTICIDVEN Microbiology Recent Results (from the past 240 hour(s))  SARS CORONAVIRUS 2 (TAT 6-24 HRS) Nasopharyngeal Nasopharyngeal Swab     Status: None   Collection Time: 01/15/21  7:54 PM   Specimen: Nasopharyngeal Swab  Result Value Ref Range Status   SARS Coronavirus 2 NEGATIVE NEGATIVE Final    Comment: (NOTE) SARS-CoV-2 target nucleic acids are NOT DETECTED.  The SARS-CoV-2 RNA is generally detectable in upper and lower respiratory specimens during the acute phase of infection. Negative results do not preclude SARS-CoV-2 infection, do not rule out co-infections with other pathogens, and should not be used as the sole basis for treatment or other patient management decisions. Negative results must be combined with clinical observations, patient history, and epidemiological information. The expected result is Negative.  Fact Sheet for Patients: SugarRoll.be  Fact Sheet for Healthcare Providers: https://www.woods-mathews.com/  This test is not yet approved or cleared by the [place] FDA  and  has been authorized for detection and/or diagnosis of SARS-CoV-2 by FDA under an Emergency Use Authorization (EUA). This EUA will remain  in effect (meaning this test can be used) for the duration of the COVID-19 declaration under Se ction 564(b)(1) of the Act, 21 U.S.C. section 360bbb-3(b)(1), unless the authorization is terminated or revoked sooner.  Performed at [facility], [place] [address] [place], [place] [address]   MRSA PCR Screening     Status: None   Collection Time: 01/16/21  7:58 AM  Specimen: Nasopharyngeal  Result Value Ref Range Status   MRSA by PCR NEGATIVE NEGATIVE Final    Comment:        The GeneXpert MRSA Assay (FDA approved for NASAL specimens only), is one component of a comprehensive MRSA colonization surveillance program. It is not intended to diagnose MRSA infection nor to guide or monitor treatment for MRSA infections. Performed at [facility], [place] [address] [place], [place] [address]     Please note: You were cared for by a hospitalist during your hospital stay. Once you are discharged, your primary care physician will handle any further medical issues. Please note that NO REFILLS for any discharge medications will be authorized once you are discharged, as it is imperative that you return to your primary care physician (or establish a relationship with a primary care physician if you do not have one) for your post hospital discharge needs so that they can reassess your need for medications and monitor your lab values.    Time coordinating discharge: 40 minutes  SIGNED:   Shelly Coss, MD  [facility] 01/17/2021, 9:15 AM Pager [phone]  If 7PM-7AM, please contact night-coverage [email or web address] Password [email or web address]

## 2021-01-17 NOTE — Care Management Obs Status (Signed)
[facility] NOTIFICATION   Patient Details  Name: Zachary [name] MRN: [id number] Date of Birth: 08-17-1951   Medicare Observation Status Notification Given:  Yes    Carles Collet, RN 01/17/2021, 10:10 AM

## 2021-01-17 NOTE — Progress Notes (Signed)
Progress Note  Patient Name: Zachary [name] Date of Encounter: 01/17/2021  [facility] Cardiologist: No primary care provider on file.   Subjective   Denies any further chest pain or SOB. Cath with non obstructive CAD of the mLAD 20% and mLCX 30% and non dominant RCA and normal LVF with normal LVEDP  Inpatient Medications    Scheduled Meds: . aspirin  81 mg Oral Daily  . enoxaparin (LOVENOX) injection  40 mg Subcutaneous Q24H  . levothyroxine  100 mcg Oral Q0600  . lisinopril  5 mg Oral Daily  . loratadine  10 mg Oral Daily  . melatonin  10 mg Oral QHS  . pantoprazole  40 mg Oral Daily  . pravastatin  80 mg Oral q1800  . sodium bicarbonate  650 mg Oral Daily  . sodium chloride flush  3 mL Intravenous Q12H   Continuous Infusions: . sodium chloride     PRN Meds: sodium chloride, acetaminophen, diazepam, ondansetron (ZOFRAN) IV, sodium chloride flush   Vital Signs    Vitals:   01/16/21 2033 01/17/21 0000 01/17/21 0334 01/17/21 0806  BP:  125/83 121/83 118/74  Pulse: 61 (!) 58 61 74  Resp: 18 15 17 20   Temp:  98.7 F (37.1 C) 98 F (36.7 C) 97.9 F (36.6 C)  TempSrc:  Oral Oral Oral  SpO2: 96% 95% 95% 94%  Weight:      Height:        Intake/Output Summary (Last 24 hours) at 01/17/2021 1024 Last data filed at 01/17/2021 0900 Gross per 24 hour  Intake 812.51 ml  Output 450 ml  Net 362.51 ml   Last 3 Weights 01/16/2021 01/15/2021 11/20/2020  Weight (lbs) 200 lb 13.4 oz 201 lb 1 oz 201 lb  Weight (kg) 91.1 kg 91.2 kg 91.173 kg      Telemetry    NSR - Personally Reviewed  ECG    No new EKG to review - Personally Reviewed  Physical Exam   GEN: No acute distress.   Neck: No JVD Cardiac: RRR, no murmurs, rubs, or gallops.  Respiratory: Clear to auscultation bilaterally. GI: Soft, nontender, non-distended  MS: No edema; No deformity. Neuro:  Nonfocal  Psych: Normal affect   Labs    High Sensitivity Troponin:   Recent Labs  Lab 01/15/21 1630  01/15/21 1828 01/15/21 2343 01/16/21 0726  TROPONINIHS 6 7 8 6       Chemistry Recent Labs  Lab 01/15/21 1630 01/17/21 0048  NA 136 138  K 3.7 4.2  CL 100 106  CO2 27 23  GLUCOSE 125* 101*  BUN 24* 15  CREATININE 1.06 0.99  CALCIUM 9.9 9.3  GFRNONAA >60 >60  ANIONGAP 9 9     Hematology Recent Labs  Lab 01/15/21 1630 01/17/21 0048  WBC 7.5 6.7  RBC 5.00 4.66  HGB 15.9 14.5  HCT 46.4 42.9  MCV 92.8 92.1  MCH 31.8 31.1  MCHC 34.3 33.8  RDW 12.6 12.7  PLT 252 218    BNPNo results for input(s): BNP, PROBNP in the last 168 hours.   DDimer No results for input(s): DDIMER in the last 168 hours.   Radiology    DG Chest 2 View  Result Date: 01/15/2021 CLINICAL DATA:  70 year old male with chest pain. EXAM: CHEST - 2 VIEW COMPARISON:  Chest radiograph dated 07/23/2019. FINDINGS: There is mild chronic interstitial coarsening and bronchitic changes. No focal consolidation, pleural effusion, or pneumothorax. The cardiac silhouette is within limits. No acute osseous  pathology. Degenerative changes of the spine. IMPRESSION: No active cardiopulmonary disease. Electronically Signed   By: Anner Crete M.D.   On: 01/15/2021 16:54   CARDIAC CATHETERIZATION  Addendum Date: 01/16/2021    Prox Cx to Mid Cx lesion is 30% stenosed.  Mid LAD lesion is 20% stenosed.  Mild nonobstructive CAD with a 20% smooth stenosis in the mid LAD; mild 30% focal stenosis in the mid circumflex vessel; and a normal dominant RCA.  Normal LV function with EF estimated 55 to 60% without focal segmental wall motion abnormalities.  LVEDP 13 mmHg. RECOMMENDATION: Medical therapy.  Aggressive lipid-lowering therapy with target LDL less than 70%.   Result Date: 01/16/2021  Prox Cx to Mid Cx lesion is 30% stenosed.  Mid LAD lesion is 20% stenosed.  Mild nonobstructive CAD with a 20% smooth stenosis in the mid LAD; mild 30% focal stenosis in the mid circumflex vessel; and a normal dominant RCA.  Normal LV  function with EF estimated 55 to 60% without focal segmental wall motion abnormalities.  LVEDP 30 mmHg. RECOMMENDATION: Medical therapy.  Aggressive lipid-lowering therapy with target LDL less than 70%.   ECHOCARDIOGRAM COMPLETE  Result Date: 01/16/2021    ECHOCARDIOGRAM REPORT   Patient Name:   Zachary [name] [facility] Date of Exam: 01/16/2021 Medical Rec #:  [id number]         Height:       68.0 in Accession #:    [id number]        Weight:       200.8 lb Date of Birth:  [birth year]        BSA:          2.047 m Patient Age:    70 years          BP:           113/79 mmHg Patient Gender: M                 HR:           56 bpm. Exam Location:  Inpatient Procedure: 2D Echo STAT ECHO Indications:    chest pain  History:        Patient has no prior history of Echocardiogram examinations.                 Risk Factors:Hypertension, Dyslipidemia and Sleep Apnea.  Sonographer:    Johny Chess Referring Phys: [place]  1. Left ventricular ejection fraction, by estimation, is 50 to 55%. The left ventricle has low normal function. The left ventricle has no regional wall motion abnormalities. Left ventricular diastolic parameters are consistent with Grade I diastolic dysfunction (impaired relaxation).  2. Right ventricular systolic function is normal. The right ventricular size is normal.  3. The mitral valve is normal in structure. Trivial mitral valve regurgitation. No evidence of mitral stenosis.  4. The aortic valve is tricuspid. Aortic valve regurgitation is trivial. Mild aortic valve sclerosis is present, with no evidence of aortic valve stenosis. FINDINGS  Left Ventricle: Left ventricular ejection fraction, by estimation, is 50 to 55%. The left ventricle has low normal function. The left ventricle has no regional wall motion abnormalities. The left ventricular internal cavity size was normal in size. There is no left ventricular hypertrophy. Left ventricular diastolic parameters are consistent with  Grade I diastolic dysfunction (impaired relaxation). Right Ventricle: The right ventricular size is normal. Right ventricular systolic function is normal. Left Atrium: Left atrial size was normal in size. Right  Atrium: Right atrial size was normal in size. Pericardium: There is no evidence of pericardial effusion. Mitral Valve: The mitral valve is normal in structure. Trivial mitral valve regurgitation. No evidence of mitral valve stenosis. Tricuspid Valve: The tricuspid valve is normal in structure. Tricuspid valve regurgitation is trivial. No evidence of tricuspid stenosis. Aortic Valve: The aortic valve is tricuspid. Aortic valve regurgitation is trivial. Mild aortic valve sclerosis is present, with no evidence of aortic valve stenosis. Pulmonic Valve: The pulmonic valve was normal in structure. Pulmonic valve regurgitation is not visualized. No evidence of pulmonic stenosis. Aorta: The aortic root is normal in size and structure. Venous: The inferior vena cava was not well visualized.  LEFT VENTRICLE PLAX 2D LVIDd:         4.90 cm Diastology LVIDs:         3.10 cm LV e' medial:    7.40 cm/s LV PW:         1.10 cm LV E/e' medial:  8.5 LV IVS:        0.90 cm LV e' lateral:   9.57 cm/s                        LV E/e' lateral: 6.6  RIGHT VENTRICLE RV S prime:     11.70 cm/s TAPSE (M-mode): 2.3 cm LEFT ATRIUM             Index       RIGHT ATRIUM           Index LA diam:        3.50 cm 1.71 cm/m  RA Area:     15.50 cm LA Vol (A2C):   39.3 ml 19.20 ml/m RA Volume:   39.70 ml  19.39 ml/m LA Vol (A4C):   42.0 ml 20.51 ml/m LA Biplane Vol: 44.3 ml 21.64 ml/m  AORTIC VALVE LVOT Vmax:   76.40 cm/s LVOT Vmean:  47.900 cm/s LVOT VTI:    0.179 m  AORTA Ao Asc diam: 3.30 cm MITRAL VALVE MV Area (PHT): 3.31 cm    SHUNTS MV Decel Time: 229 msec    Systemic VTI: 0.18 m MV E velocity: 63.00 cm/s MV A velocity: 77.10 cm/s MV E/A ratio:  0.82 Kirk Ruths MD Electronically signed by Kirk Ruths MD Signature Date/Time:  01/16/2021/4:20:02 PM    Final     Cardiac Studies   Cardiac Cath 01/16/2021 Conclusion    Prox Cx to Mid Cx lesion is 30% stenosed.  Mid LAD lesion is 20% stenosed.  Mild nonobstructive CAD with a 20% smooth stenosis in the mid LAD; mild 30% focal stenosis in the mid circumflex vessel; and a normal dominant RCA.   Normal LV function with EF estimated 55 to 60% without focal segmental wall motion abnormalities. LVEDP 30 mmHg.  RECOMMENDATION: Medical therapy. Aggressive lipid-lowering therapy with target LDL less than 70%.  2D echo 01/16/2021 IMPRESSIONS    1. Left ventricular ejection fraction, by estimation, is 50 to 55%. The  left ventricle has low normal function. The left ventricle has no regional  wall motion abnormalities. Left ventricular diastolic parameters are  consistent with Grade I diastolic  dysfunction (impaired relaxation).  2. Right ventricular systolic function is normal. The right ventricular  size is normal.  3. The mitral valve is normal in structure. Trivial mitral valve  regurgitation. No evidence of mitral stenosis.  4. The aortic valve is tricuspid. Aortic valve regurgitation is trivial.  Mild aortic valve sclerosis  is present, with no evidence of aortic valve  stenosis.   Patient Profile     70 y.o. male with a hx of HLD, GERD, HTN who is being seen today for the evaluation of chest pain at the request of Karmen Bongo, MD.  [place]    1. Unstable Angina -presented initially with episode of exertional angina after cutting wood but now having episodes of rest pain since admit to hospital associated with diaphoresis and nitrate responsive -EKG is non ischemia and hsTrop neg x 4.  -Cath yesterday with mild non obstructive CAD with mLAD 20% and mLCX 30% and non dominant RCA and normal LVF with normal LVEDP -medical management recommended and will continue ASA and statin -2D echo with low normal LVF  2.  ASCAD -mild non  obstructive by cath in 2011 and CP felt to be GERD at that time -no significant change in cath yesterday -continue ASA and statin  3.  HLD -LDL goal < 70 -LDL was 90 this admit -intolerant to atorvastatin and simvastatin and had confusion and diarrhea with crestor -increased pravastatin to 80mg  daily -will need FLp and ALT in 6 weeks  4.  HTN -BP controlled one xam -continue Lisinopril 5mg  daily  [facility] will sign off.   Medication Recommendations:  ASA 81mg  daily, Lisiniopril 5mg  daily, Pravastatin 80mg  daily Other recommendations (labs, testing, etc):  FLP and ALT in 6 weeks Follow up as an outpatient:  followup 1-2 weeks with PA in our office   For questions or updates, please contact [place] Please consult [email or web address] for contact info under        Signed, Fransico Him, MD  01/17/2021, 10:24 AM

## 2021-01-19 ENCOUNTER — Telehealth: Payer: Self-pay

## 2021-01-19 ENCOUNTER — Encounter: Payer: Medicare Other | Admitting: Physical Therapy

## 2021-01-19 NOTE — Telephone Encounter (Signed)
Transition Care Management Follow-up Telephone Call  Date of discharge and from where: 01/17/21-[place]  How have you been since you were released from the hospital? [place] wife. Following heart healthy diet. No further chest pains.  Any questions or concerns? No  Items Reviewed:  Did the pt receive and understand the discharge instructions provided? Yes   Medications obtained and verified? Yes   Other? Yes   Any new allergies since your discharge? No   Dietary orders reviewed? Yes  Do you have support at home? Yes   Home Care and Equipment/Supplies: Were home health services ordered? no If so, what is the name of the agency? n/a  Has the agency set up a time to come to the patient's home? not applicable Were any new equipment or medical supplies ordered?  No What is the name of the medical supply agency? n/a Were you able to get the supplies/equipment? not applicable Do you have any questions related to the use of the equipment or supplies? n/a  Functional Questionnaire: (I = Independent and D = Dependent) ADLs: I with assistance  Bathing/Dressing- I  Meal Prep- D  Eating- I  Maintaining continence- I  Transferring/Ambulation- I  Managing Meds- I with assistance  Follow up appointments reviewed:   PCP Hospital f/u appt confirmed? Yes  Scheduled to see Dr. Nani Ravens on 01/22/21 @ 11:15.  [facility] f/u appt confirmed? Yes  Scheduled to see Kathyrn Drown on 02/02/21 @ 2:15.  Are transportation arrangements needed? No   If their condition worsens, is the pt aware to call PCP or go to the Emergency Dept.? Yes  Was the patient provided with contact information for the PCP's office or ED? Yes  Was to pt encouraged to call back with questions or concerns? Yes

## 2021-01-22 ENCOUNTER — Ambulatory Visit (INDEPENDENT_AMBULATORY_CARE_PROVIDER_SITE_OTHER): Payer: Medicare Other | Admitting: Family Medicine

## 2021-01-22 ENCOUNTER — Other Ambulatory Visit: Payer: Self-pay

## 2021-01-22 ENCOUNTER — Encounter: Payer: Self-pay | Admitting: Family Medicine

## 2021-01-22 VITALS — BP 112/78 | HR 61 | Temp 98.3°F | Resp 18 | Wt 204.6 lb

## 2021-01-22 DIAGNOSIS — R079 Chest pain, unspecified: Secondary | ICD-10-CM | POA: Diagnosis not present

## 2021-01-22 DIAGNOSIS — I25729 Atherosclerosis of autologous artery coronary artery bypass graft(s) with unspecified angina pectoris: Secondary | ICD-10-CM

## 2021-01-22 NOTE — Patient Instructions (Addendum)
Keep the diet clean and stay active.  Listen to your body.  Please be fasting for your labs.   Let us know if you need anything.

## 2021-01-22 NOTE — ACP (Advance Care Planning) (Signed)
Pt requesting to be full code and was misunderstanding what he was asked while in the hospital. Zachary [name] 12:02 PM 01/22/21

## 2021-01-22 NOTE — Progress Notes (Signed)
Chief Complaint  Patient presents with  . Follow-up    Hospital: unstable angina. Needs something in righting to go to PT Needs lipid and liver function testing in 6 weeks.  Pravastatin is now 80 mg- 01/18/2021. Pt needs DNR removed from his chart.    HPI HAO DION is a 70 y.o. y.o. male who presents for a transition of care visit.  Pt was discharged from  [facility] 01/17/21.  He is here with his wife.  Patient was admitted for chest pain from 1/24 and discharged on 1/26.  He had a catheterization that revealed 20 to 30% obstruction.  He improved with the catheter and never required nitro again.  The nitro did help with his pain.  He does have a history of reflux but notes it did not feel like that.  It started after he was working on a scroll saw.  He has not had any issues since then.  He has a history of balance issues for which he follows with physical therapy.  He would like a letter stating it is okay to return to regular sessions.  He has a follow-up with the cardiology team at the end of next week.  Past Medical History:  Diagnosis Date  . Arthritis   . Articular cartilage disease    left shoulder  . Diverticulitis   . DNR (do not resuscitate) 01/16/2021  . Dyslipidemia (high LDL; low HDL) 08/15/2016  . GERD (gastroesophageal reflux disease)   . Hearing problem    hearing deficit  . Hemorrhoids   . Hypertension   . Hypothyroidism   . Prostate cancer ([place]) 2015   treated and in remission  . Sleep apnea    uses a cpap  . Wears glasses   . Wears hearing aid    both ears   Past Surgical History:  Procedure Laterality Date  . BACK SURGERY  1978   herniated disksurgery  . CARDIAC CATHETERIZATION  08/01/2010  . COLONOSCOPY    . EXAM UNDER ANESTHESIA WITH MANIPULATION OF SHOULDER Right 09/15/2014   Procedure: RIGHT SHOULDER MANIPULATION UNDER ANESTHESIA;  Surgeon: Ninetta Lights, MD;  Location: [place];  Service: Orthopedics;  Laterality: Right;  .  HEMORRHOID SURGERY  08/2006  . LEFT HEART CATH AND CORONARY ANGIOGRAPHY N/A 01/16/2021   Procedure: LEFT HEART CATH AND CORONARY ANGIOGRAPHY;  Surgeon: Troy Sine, MD;  Location: [place] [facility];  Service: Cardiovascular;  Laterality: N/A;  . SHOULDER ARTHROSCOPY WITH DISTAL CLAVICLE RESECTION Left 07/07/2015   Procedure: SHOULDER ARTHROSCOPY WITH DISTAL CLAVICLE RESECTION;  Surgeon: Kathryne Hitch, MD;  Location: [place];  Service: Orthopedics;  Laterality: Left;  . SHOULDER ARTHROSCOPY WITH ROTATOR CUFF REPAIR Left 12/07/2015   Procedure: LEFT SHOULDER ARTHROSCOPY DEBRIDEMENT, WITH ROTATOR CUFF REPAIR;  Surgeon: Ninetta Lights, MD;  Location: [place];  Service: Orthopedics;  Laterality: Left;  . SHOULDER ARTHROSCOPY WITH ROTATOR CUFF REPAIR AND SUBACROMIAL DECOMPRESSION Left 07/07/2015   Procedure: LEFT SHOULDER SCOPE DEBRIDEMENT, SUBACROMIAL DECOMPRESSION, DISTAL CLAVICULECTOMY, ROTATOR CUFF REPAIR  ;  Surgeon: Kathryne Hitch, MD;  Location: [place];  Service: Orthopedics;  Laterality: Left;  ANESTHESIA: GENERAL, PRE/POST OP SCALENE  . SHOULDER ARTHROSCOPY WITH SUBACROMIAL DECOMPRESSION, ROTATOR CUFF REPAIR AND BICEP TENDON REPAIR Right 03/10/2014   Procedure: RIGHT SHOULDER ARTHROSCOPY WITH SUBACROMIAL DECOMPRESSION, PARTIAL ACROMIOPLASTY WITH CORACOAROMIAL LABRUM DEBRIDEMENT RELEASE DISTAL CLAVICULECTOMY,  ROTATOR CUFF REPAIR AND EXTENSIVE DEBRIDEMENT;  Surgeon: Ninetta Lights, MD;  Location: [facility];  Service: Orthopedics;  Laterality: Right;  . TENDON REPAIR  June 06, 2011   right elbow, Dr. Percell Miller   Family History  Problem Relation Age of Onset  . Heart disease Father 36  . Breast cancer Mother   . Cancer Brother   . Alcohol abuse Other   . Arthritis Other   . Cancer Other        Breast, Prostate  . Coronary artery disease Other   . Irritable bowel syndrome Other   . Cystic fibrosis Other   . Colon cancer Neg  Hx    Allergies as of 01/22/2021      Reactions   Oxycodone Itching   Atorvastatin Other (See Comments)   Simvastatin Other (See Comments)   REACTION: muscle pain      Medication List       Accurate as of January 22, 2021 12:05 PM. If you have any questions, ask your nurse or doctor.        aspirin EC 81 MG tablet Take 81 mg by mouth daily.   Euthyrox 100 MCG tablet Generic drug: levothyroxine Take 1 tablet by mouth once daily What changed:   how much to take  when to take this   lisinopril 5 MG tablet Commonly known as: ZESTRIL Take 5 mg by mouth daily. What changed: Another medication with the same name was removed. Continue taking this medication, and follow the directions you see here. Changed by: [name], DO   loratadine 10 MG tablet Commonly known as: CLARITIN Take 10 mg by mouth daily.   Melatonin 10 MG Caps Take by mouth. What changed: Another medication with the same name was removed. Continue taking this medication, and follow the directions you see here. Changed by: [name], DO   omeprazole 20 MG capsule Commonly known as: PRILOSEC Take 1 capsule (20 mg total) by mouth daily.   pravastatin 80 MG tablet Commonly known as: PRAVACHOL Take 1 tablet (80 mg total) by mouth daily at 6 PM.   sodium bicarbonate 650 MG tablet Take 1 tablet by mouth once daily       ROS:  Constitutional: No fevers or chills, no weight loss HEENT: No headaches, hearing loss, or runny nose, no sore throat Heart: No chest pain Lungs: No SOB, no cough Abd: No bowel changes, no pain, no N/V GU: No urinary complaints Neuro: No numbness, tingling or weakness Msk: No joint or muscle pain  Objective BP 112/78 (BP Location: Left Arm, Patient Position: Sitting, Cuff Size: Normal)   Pulse 61   Temp 98.3 F (36.8 C) (Oral)   Resp 18   Wt 204 lb 9.6 oz (92.8 kg)   SpO2 98%   BMI 31.11 kg/m  General Appearance:  awake, alert, oriented, in no  acute distress and well developed, well nourished Skin:  there are no suspicious lesions or rashes of concern Head/face:  NCAT Eyes:  EOMI, PERRLA Lungs: Clear to auscultation.  No rales, rhonchi, or wheezing. Normal effort, no accessory muscle use. Heart:  Heart sounds are normal.  Regular rate and rhythm without murmur, gallop or rub. No bruits. Abdomen:  BS+, soft, NT, ND, no masses or organomegaly Musculoskeletal:  No muscle group atrophy or asymmetry Neurologic: No cerebellar signs Psych exam: Nml mood and affect, age appropriate judgment and insight  Chest pain, unspecified type  Coronary artery disease involving autologous artery coronary bypass graft with angina pectoris ([place]) - Plan: Hepatic function panel, Lipid panel  Discharge  summary and medication list have been reviewed/reconciled.  Labs pending at the time of discharge have been reviewed or are still pending at the time of this visit.  Follow-up labs and appointments have been ordered and/or coordinated appropriately.  Has appointment with cardiology on 2/11. Increase dosage of pravastatin from 40 mg to 80 mg while in the hospital, will recheck lipid panel and hepatic function panel in 6 weeks. Continue statin and aspirin daily. Counseled on diet and exercise. Of note, pt was confused about DNR status. He does not wish to be a DNR at this time and wishes to be a full code. F/u in 6 weeks for labs. The patient voiced understanding and agreement to the plan.  Greater than 30 minutes were spent with the patient in addition to reviewing their chart information on the same day of the visit.   [place], DO 01/22/21 12:05 PM

## 2021-01-23 ENCOUNTER — Encounter: Payer: Self-pay | Admitting: Physical Therapy

## 2021-01-23 ENCOUNTER — Ambulatory Visit: Payer: Medicare Other | Attending: Family Medicine | Admitting: Physical Therapy

## 2021-01-23 VITALS — BP 119/64 | HR 76

## 2021-01-23 DIAGNOSIS — M6281 Muscle weakness (generalized): Secondary | ICD-10-CM | POA: Insufficient documentation

## 2021-01-23 DIAGNOSIS — R2681 Unsteadiness on feet: Secondary | ICD-10-CM | POA: Insufficient documentation

## 2021-01-23 DIAGNOSIS — R42 Dizziness and giddiness: Secondary | ICD-10-CM | POA: Diagnosis not present

## 2021-01-23 NOTE — Therapy (Addendum)
[facility] [address]  [place], [place], [address] Phone: [phone]   Fax:  [phone]  Physical Therapy Treatment  Patient Details  Name: Zachary [name] MRN: [id number] Date of Birth: Aug 22, 1951 Referring Provider (PT): Riki Sheer, DO   Progress Note Reporting Period 01/12/21 to 01/23/21  See note below for Objective Data and Assessment of Progress/Goals.     Encounter Date: 01/23/2021   PT End of Session - 01/23/21 0928    Visit Number 12    Number of Visits 13    Date for PT Re-Evaluation 01/18/21    Authorization Type Medicare & [employer]    PT Start Time 0845    PT Stop Time 0923    PT Time Calculation (min) 38 min    Equipment Utilized During Treatment Gait belt    Activity Tolerance Patient tolerated treatment well    Behavior During Therapy WFL for tasks assessed/performed           Past Medical History:  Diagnosis Date  . Arthritis   . Articular cartilage disease    left shoulder  . Diverticulitis   . DNR (do not resuscitate) 01/16/2021  . Dyslipidemia (high LDL; low HDL) 08/15/2016  . GERD (gastroesophageal reflux disease)   . Hearing problem    hearing deficit  . Hemorrhoids   . Hypertension   . Hypothyroidism   . Prostate cancer ([place]) 2015   treated and in remission  . Sleep apnea    uses a cpap  . Wears glasses   . Wears hearing aid    both ears    Past Surgical History:  Procedure Laterality Date  . BACK SURGERY  1978   herniated disksurgery  . CARDIAC CATHETERIZATION  08/01/2010  . COLONOSCOPY    . EXAM UNDER ANESTHESIA WITH MANIPULATION OF SHOULDER Right 09/15/2014   Procedure: RIGHT SHOULDER MANIPULATION UNDER ANESTHESIA;  Surgeon: Ninetta Lights, MD;  Location: [facility];  Service: Orthopedics;  Laterality: Right;  . HEMORRHOID SURGERY  08/2006  . LEFT HEART CATH AND CORONARY ANGIOGRAPHY N/A 01/16/2021   Procedure: LEFT HEART CATH AND  CORONARY ANGIOGRAPHY;  Surgeon: Troy Sine, MD;  Location: [facility];  Service: Cardiovascular;  Laterality: N/A;  . SHOULDER ARTHROSCOPY WITH DISTAL CLAVICLE RESECTION Left 07/07/2015   Procedure: SHOULDER ARTHROSCOPY WITH DISTAL CLAVICLE RESECTION;  Surgeon: Kathryne Hitch, MD;  Location: [facility];  Service: Orthopedics;  Laterality: Left;  . SHOULDER ARTHROSCOPY WITH ROTATOR CUFF REPAIR Left 12/07/2015   Procedure: LEFT SHOULDER ARTHROSCOPY DEBRIDEMENT, WITH ROTATOR CUFF REPAIR;  Surgeon: Ninetta Lights, MD;  Location: [facility];  Service: Orthopedics;  Laterality: Left;  . SHOULDER ARTHROSCOPY WITH ROTATOR CUFF REPAIR AND SUBACROMIAL DECOMPRESSION Left 07/07/2015   Procedure: LEFT SHOULDER SCOPE DEBRIDEMENT, SUBACROMIAL DECOMPRESSION, DISTAL CLAVICULECTOMY, ROTATOR CUFF REPAIR  ;  Surgeon: Kathryne Hitch, MD;  Location: [facility];  Service: Orthopedics;  Laterality: Left;  ANESTHESIA: GENERAL, PRE/POST OP SCALENE  . SHOULDER ARTHROSCOPY WITH SUBACROMIAL DECOMPRESSION, ROTATOR CUFF REPAIR AND BICEP TENDON REPAIR Right 03/10/2014   Procedure: RIGHT SHOULDER ARTHROSCOPY WITH SUBACROMIAL DECOMPRESSION, PARTIAL ACROMIOPLASTY WITH CORACOAROMIAL LABRUM DEBRIDEMENT RELEASE DISTAL CLAVICULECTOMY,  ROTATOR CUFF REPAIR AND EXTENSIVE DEBRIDEMENT;  Surgeon: Ninetta Lights, MD;  Location: [facility];  Service: Orthopedics;  Laterality: Right;  . TENDON REPAIR  June 06, 2011   right elbow, Dr. Percell Miller    Vitals:   01/23/21 0932  BP: 119/64  Pulse: 76  SpO2: 99%     Subjective Assessment - 01/23/21 0849    Subjective Patient reports that he has been asymptomatic since his recent ED visit for chest pain. Reports being given no activity restrictions. Denies recent changes in balance since this episode of chest pain. Rpeorts that he would like to wrap up with PT on a 30 day hold at this time.    Pertinent History hypothyroidism, HTN,  hearing problem, GERD, hx prostate CA, back surgery 1978, B RTC repair & R biceps tendon repair    Diagnostic tests 10/07/20 brain MRI: Small remote lacunar infarct in the right cerebellar hemisphere. Mild chronic small vessel ischemia parenchymal volume loss.    Patient Stated Goals improve balance    Currently in Pain? No/denies              [facility] PT Assessment - 01/23/21 0001      Assessment   Medical Diagnosis Balance problem    Referring Provider (PT) Riki Sheer, DO    Onset Date/Surgical Date 10/07/20      AROM   Right Ankle Dorsiflexion 6    Left Ankle Dorsiflexion 8      Strength   Right Hip Flexion 4+/5    Right Hip ABduction 4+/5    Right Hip ADduction 4/5    Left Hip Flexion 4+/5    Left Hip ABduction 4+/5    Left Hip ADduction 4/5    Right Knee Flexion 4+/5    Right Knee Extension 4+/5    Left Knee Flexion 4+/5    Left Knee Extension 4+/5    Right Ankle Dorsiflexion 4+/5    Right Ankle Plantar Flexion 4+/5    Left Ankle Dorsiflexion 4+/5    Left Ankle Plantar Flexion 4+/5      Static Standing Balance   Static Standing - Comment/# of Minutes M-CTSIB: EO/firm- WNL, EC/firm- mild sway, EO/foam- WNL, EC/foam- mild sway                         [facility] Adult PT Treatment/Exercise - 01/23/21 0001      Neuro Re-ed    Neuro Re-ed Details  forward/backward stepping with head nods to targets10x each   1 episode of slight LOB requiring CGA     Knee/Hip Exercises: Stretches   Gastroc Stretch Right;Left;30 seconds;2 reps    Gastroc Stretch Limitations runner's stretch      Knee/Hip Exercises: Aerobic   Recumbent Bike Lvl 5, 6 min      Knee/Hip Exercises: Sidelying   Hip ADduction Strengthening;Right;Left;1 set;10 reps    Hip ADduction Limitations opposite LE elevated on bolster           Vestibular Treatment/Exercise - 01/23/21 0001      Nestor Lewandowsky   Number of Reps  4    Symptom Description  4x each side with EC; 2x each EC+  dynadisc under feet                 PT Education - 01/23/21 0928    Education Details update to HEP; discussion on objective progress and remaining impairments    Person(s) Educated Patient    Methods Explanation;Demonstration;Tactile cues;Verbal cues;Handout    Comprehension Returned demonstration;Verbalized understanding            PT Short Term Goals - 01/23/21 0854      PT SHORT TERM GOAL #1   Title Patient to be independent with initial  HEP.    Time 3    Period Weeks    Status Achieved    Target Date 12/28/20             PT Long Term Goals - 01/23/21 0854      PT LONG TERM GOAL #1   Title Patient to be independent with advanced HEP.    Time 6    Period Weeks    Status Achieved      PT LONG TERM GOAL #2   Title Patient to demonstrate B LE strength >/=4+/5.    Time 6    Period Weeks    Status Partially Met   01/04/21:  partially met d/t adduction weakness     PT LONG TERM GOAL #3   Title Patient to demonstrate mid sway with M-CTSIB condition EC/foam surface to improve ability to balance in dark/uneven environments.    Time 6    Period Weeks    Status Achieved   demonstrating mild/moderate sway     PT LONG TERM GOAL #4   Title patient to demo active L ankle ROM to [facility] to normalize ADLS    Time 6    Period Weeks    Status Partially Met   01/23/21: slight decrease     PT LONG TERM GOAL #5   Title Patient to score >19 on DGI in order to decrease risk of falls.    Time 6    Period Weeks    Status Achieved                 Plan - 01/23/21 1610    Clinical Impression Statement Patient reports that he has been asymptomatic since his recent ED visit for angina, with cardiac cath on 01/16/21 demonstrating mild nonobstructive CAD. Patient has been cleared for PT by Cardiologist. Denies recent changes in balance since this episode of chest pain. Strength goal has been met with exception of B hip adduction. Ankle AROM has decreased since last session,  however patient reports that he has not bed active in the last week d/t his recent hospitalization, leading to ankle stiffness. Encouraged patient to continue calf stretching to reach pre-hospitalization ankle mobility. Patient has met both balance goal at this time. Reviewed current HEP program to assess for difficulty. Patient able to perform Nestor Lewandowsky with additional challenges without c/o dizziness. Dynamic gaze stabilization still results in infrequent LOB. Updated HEP with exercises to address remaining deficits. Patient reported understanding and without complaints at end of session. Patient has met or partially met all goals at this time and is now on 30 day hold.    Personal Factors and Comorbidities Age;Comorbidity 3+;Fitness;Past/Current Experience;Time since onset of injury/illness/exacerbation    Comorbidities hypothyroidism, HTN, hearing problem, GERD, hx prostate CA, back surgery 1978, B RTC repair & R biceps tendon repair    Examination-Activity Limitations Bed Mobility;Bathing;Bend;Squat;Stairs;Stand;Carry;Toileting;Dressing;Transfers;Hygiene/Grooming;Lift;Locomotion Level;Reach Overhead    Examination-Participation Restrictions Church;Cleaning;Shop;Community Activity;Driving;Yard Work;Laundry;Meal Prep    Rehab Potential Good    PT Frequency 2x / week    PT Duration 6 weeks    PT Treatment/Interventions ADLs/Self Care Home Management;Cryotherapy;Electrical Stimulation;Canalith Repostioning;Moist Heat;Balance training;Therapeutic exercise;Therapeutic activities;Functional mobility training;Stair training;Gait training;Ultrasound;Neuromuscular re-education;Patient/family education;Manual techniques;Vestibular;Taping;Energy conservation;Dry needling;Passive range of motion    PT Next Visit Plan 30 day hold at this time    Consulted and Agree with Plan of Care Patient    Family Member Consulted wife           Patient will benefit from skilled therapeutic intervention in  order to  improve the following deficits and impairments:  Abnormal gait,Decreased activity tolerance,Decreased strength,Pain,Decreased balance,Difficulty walking,Improper body mechanics,Decreased range of motion,Dizziness,Impaired flexibility,Postural dysfunction,Decreased coordination  Visit Diagnosis: Unsteadiness on feet  Dizziness and giddiness  Muscle weakness (generalized)     Problem List Patient Active Problem List   Diagnosis Date Noted  . Coronary artery disease involving autologous artery coronary bypass graft with angina pectoris ([place]) 01/22/2021  . DNR (do not resuscitate) 01/16/2021  . Chest pain 01/15/2021  . Prediabetes 05/27/2018  . Stomatitis 04/17/2017  . Dyslipidemia (high LDL; low HDL) 08/15/2016  . S/P arthroscopy of shoulder 05/31/2016  . History of prostate cancer 05/09/2016  . Complete rotator cuff tear of left shoulder 07/05/2015  . Tennis elbow 05/30/2014  . Rotator cuff (capsule) sprain 03/10/2014  . Right shoulder pain 02/11/2014  . Memory difficulty 12/18/2012  . Gynecomastia, male 07/30/2012  . OSA (obstructive sleep apnea) 04/23/2011  . EXTERNAL HEMORRHOIDS 02/12/2011  . Prostate cancer ([place]) 11/12/2010  . PLANTAR FASCIITIS 04/09/2010  . Hypothyroidism 12/08/2009  . NEOPLASM OF UNCERTAIN BEHAVIOR OF SKIN 03/10/2009  . OSTEOARTHRITIS 02/14/2009  . HEARING DEFICIT 02/08/2009  . Essential hypertension 02/08/2009  . Allergic rhinitis 02/08/2009  . GERD 02/08/2009  . PROTEINURIA 02/08/2009  . DIVERTICULITIS, HX OF 02/08/2009     Janene Harvey, PT, DPT 01/23/21 9:31 AM   [facility] [address]  [place], [place], [address] Phone: [phone]   Fax:  [phone]  Name: NAZIM KADLEC MRN: [id number] Date of Birth: Feb 07, 1951  PHYSICAL THERAPY DISCHARGE SUMMARY  Visits from Start of Care: 12  Current functional level related to goals / functional outcomes: See above  clinical impression; patient did not return during 30 day hold   Remaining deficits: Limited ankle ROM, hip weakness   Education / Equipment: HEP  Plan: Patient agrees to discharge.  Patient goals were partially met. Patient is being discharged due to being pleased with the current functional level.  ?????     Janene Harvey, PT, DPT  02/20/21 2:59 PM

## 2021-01-24 ENCOUNTER — Other Ambulatory Visit: Payer: Self-pay | Admitting: Family Medicine

## 2021-01-24 DIAGNOSIS — K219 Gastro-esophageal reflux disease without esophagitis: Secondary | ICD-10-CM

## 2021-01-26 ENCOUNTER — Encounter: Payer: Medicare Other | Admitting: Physical Therapy

## 2021-01-29 NOTE — Progress Notes (Signed)
Cardiology Office Note   Date:  02/02/2021   ID:  [name], DOB 25-Jun-1951, MRN [id number]  PCP:  Zachary Pal, DO  Cardiologist:  Dr. Fransico Him, MD   Chief Complaint  Patient presents with  . Follow-up    History of Present Illness: Zachary [name] is a 70 y.o. male who presents for post cath follow up, seen for Dr. Radford [name].  Zachary [name] has a hx of HLD, GERD, HTN, TIA, Prostate CA, Hypothyroidism and OSA on CPAP and LHC in 2011 with mild non obstructive disease of the LAD. He was in his usual state of health when he developed chest pain while working in his garage. He has a fm hx of CAD with his father dying of an MI therefore his wife brought him to the ER for evaluation.     In ER he was given NTG SLG which improved his symptoms. HsT 6>7>8>6. Given hx and CV risk factors, he underwent LHC 01/16/21 which showed non-obstructive CAD with mLAD 20% and mLCX 30% and non dominant RCA and normal LVF with normal LVEDP. He was continued on ASA and pravastatin 80.   Today he is here with his wife and reports he has had no further anginal symptoms since discharge. He is tolerating high dose statin without complication. He has follow up scheduled with his PCP for repeat lipid panel next week. BP is well controlled. Wife reports they have changed their diet. They are no longer eating refined, processed foods and are starting to exercise more. Overall doing very well.   Past Medical History:  Diagnosis Date  . Arthritis   . Articular cartilage disease    left shoulder  . Diverticulitis   . DNR (do not resuscitate) 01/16/2021  . Dyslipidemia (high LDL; low HDL) 08/15/2016  . GERD (gastroesophageal reflux disease)   . Hearing problem    hearing deficit  . Hemorrhoids   . Hypertension   . Hypothyroidism   . Prostate cancer ([place]) 2015   treated and in remission  . Sleep apnea    uses a cpap  . Wears glasses   . Wears hearing aid    both ears    Past  Surgical History:  Procedure Laterality Date  . BACK SURGERY  1978   herniated disksurgery  . CARDIAC CATHETERIZATION  08/01/2010  . COLONOSCOPY    . EXAM UNDER ANESTHESIA WITH MANIPULATION OF SHOULDER Right 09/15/2014   Procedure: RIGHT SHOULDER MANIPULATION UNDER ANESTHESIA;  Surgeon: Ninetta Lights, MD;  Location: [facility];  Service: Orthopedics;  Laterality: Right;  . HEMORRHOID SURGERY  08/2006  . LEFT HEART CATH AND CORONARY ANGIOGRAPHY N/A 01/16/2021   Procedure: LEFT HEART CATH AND CORONARY ANGIOGRAPHY;  Surgeon: Troy Sine, MD;  Location: [facility];  Service: Cardiovascular;  Laterality: N/A;  . SHOULDER ARTHROSCOPY WITH DISTAL CLAVICLE RESECTION Left 07/07/2015   Procedure: SHOULDER ARTHROSCOPY WITH DISTAL CLAVICLE RESECTION;  Surgeon: Kathryne Hitch, MD;  Location: [facility];  Service: Orthopedics;  Laterality: Left;  . SHOULDER ARTHROSCOPY WITH ROTATOR CUFF REPAIR Left 12/07/2015   Procedure: LEFT SHOULDER ARTHROSCOPY DEBRIDEMENT, WITH ROTATOR CUFF REPAIR;  Surgeon: Ninetta Lights, MD;  Location: [facility];  Service: Orthopedics;  Laterality: Left;  . SHOULDER ARTHROSCOPY WITH ROTATOR CUFF REPAIR AND SUBACROMIAL DECOMPRESSION Left 07/07/2015   Procedure: LEFT SHOULDER SCOPE DEBRIDEMENT, SUBACROMIAL DECOMPRESSION, DISTAL CLAVICULECTOMY, ROTATOR CUFF REPAIR  ;  Surgeon: Kathryne Hitch, MD;  Location:  [facility];  Service: Orthopedics;  Laterality: Left;  ANESTHESIA: GENERAL, PRE/POST OP SCALENE  . SHOULDER ARTHROSCOPY WITH SUBACROMIAL DECOMPRESSION, ROTATOR CUFF REPAIR AND BICEP TENDON REPAIR Right 03/10/2014   Procedure: RIGHT SHOULDER ARTHROSCOPY WITH SUBACROMIAL DECOMPRESSION, PARTIAL ACROMIOPLASTY WITH CORACOAROMIAL LABRUM DEBRIDEMENT RELEASE DISTAL CLAVICULECTOMY,  ROTATOR CUFF REPAIR AND EXTENSIVE DEBRIDEMENT;  Surgeon: Ninetta Lights, MD;  Location: [facility];  Service: Orthopedics;   Laterality: Right;  . TENDON REPAIR  June 06, 2011   right elbow, Dr. Percell Miller     Current Outpatient Medications  Medication Sig Dispense Refill  . aspirin EC 81 MG EC tablet Take 81 mg by mouth daily.    . EUTHYROX 100 MCG tablet Take 1 tablet by mouth once daily 90 tablet 0  . lisinopril (ZESTRIL) 5 MG tablet Take 5 mg by mouth daily.    Marland Kitchen loratadine (CLARITIN) 10 MG tablet Take 10 mg by mouth daily.    . Melatonin 10 MG CAPS Take by mouth.    . nitroGLYCERIN (NITROSTAT) 0.4 MG SL tablet Place 1 tablet (0.4 mg total) under the tongue every 5 (five) minutes as needed for chest pain. 25 tablet 3  . omeprazole (PRILOSEC) 20 MG capsule Take 1 capsule by mouth once daily 90 capsule 0  . pravastatin (PRAVACHOL) 80 MG tablet Take 1 tablet (80 mg total) by mouth daily at 6 PM. 30 tablet 1  . sodium bicarbonate 650 MG tablet Take 1 tablet by mouth once daily 90 tablet 0   No current facility-administered medications for this visit.    Allergies:   Oxycodone, Atorvastatin, and Simvastatin    Social History:  The patient  reports that he has never smoked. He has never used smokeless tobacco. He reports current alcohol use. He reports that he does not use drugs.   Family History:  The patient's family history includes Alcohol abuse in an other family member; Arthritis in an other family member; Breast cancer in his mother; Cancer in his brother and another family member; Coronary artery disease in an other family member; Cystic fibrosis in an other family member; Heart disease (age of onset: 44) in his father; Irritable bowel syndrome in an other family member.    ROS:  Please see the history of present illness. Otherwise, review of systems are positive for none.  All other systems are reviewed and negative.    PHYSICAL EXAM: VS:  BP 128/62   Pulse 73   Ht 5\' 8"  (1.727 m)   Wt 206 lb 12.8 oz (93.8 kg)   SpO2 95%   BMI 31.44 kg/m  , BMI Body mass index is 31.44 kg/m.   General: Well  developed, well nourished, NAD Lungs:Clear to ausculation bilaterally. No wheezes, rales, or rhonchi. Breathing is unlabored. Cardiovascular: RRR with S1 S2. No murmurs Extremities: No edema. Radial  pulses 2+ bilaterally Neuro: Alert and oriented. No focal deficits. No facial asymmetry. MAE spontaneously. Psych: Responds to questions appropriately with normal affect.     EKG:  EKG is not ordered today.   Recent Labs: 09/27/2020: ALT 20 01/16/2021: TSH 3.656 01/17/2021: BUN 15; Creatinine, Ser 0.99; Hemoglobin 14.5; Platelets 218; Potassium 4.2; Sodium 138   Lipid Panel    Component Value Date/Time   CHOL 144 01/16/2021 0730   TRIG 100 01/16/2021 0730   HDL 34 (L) 01/16/2021 0730   CHOLHDL 4.2 01/16/2021 0730   VLDL 20 01/16/2021 0730   LDLCALC 90 01/16/2021 0730   LDLDIRECT 82.0 04/14/2020 0350  Wt Readings from Last 3 Encounters:  02/02/21 206 lb 12.8 oz (93.8 kg)  01/22/21 204 lb 9.6 oz (92.8 kg)  01/16/21 200 lb 13.4 oz (91.1 kg)     Other studies Reviewed: Additional studies/ records that were reviewed today include:  Review of the above records demonstrates:   Cardiac Cath 01/16/2021 Conclusion    Prox Cx to Mid Cx lesion is 30% stenosed.  Mid LAD lesion is 20% stenosed.  Mild nonobstructive CAD with a 20% smooth stenosis in the mid LAD; mild 30% focal stenosis in the mid circumflex vessel; and a normal dominant RCA.   Normal LV function with EF estimated 55 to 60% without focal segmental wall motion abnormalities. LVEDP 30 mmHg.  RECOMMENDATION: Medical therapy. Aggressive lipid-lowering therapy with target LDL less than 70%.  Echo 01/16/2021  1. Left ventricular ejection fraction, by estimation, is 50 to 55%. The  left ventricle has low normal function. The left ventricle has no regional  wall motion abnormalities. Left ventricular diastolic parameters are  consistent with Grade I diastolic  dysfunction (impaired relaxation).  2. Right  ventricular systolic function is normal. The right ventricular  size is normal.  3. The mitral valve is normal in structure. Trivial mitral valve  regurgitation. No evidence of mitral stenosis.  4. The aortic valve is tricuspid. Aortic valve regurgitation is trivial.  Mild aortic valve sclerosis is present, with no evidence of aortic valve  stenosis.   ASSESSMENT AND PLAN:  1. Chest pain: -Presented to the ED after an episode of chest pain with a negative EKG and HsT level. LHC performed that showed mild non obstructive CAD with mLAD 20% and mLCX 30% and non dominant RCA and normal LVF with normal LVEDP -Medical management was recommended with ASA, high intensity statin  -Echo with low normal LVEF>>follow with surveillance study if symptoms  -Appears euvolemic on exam   2. HLD: -LDL, 90 on admission with LDL goal <70 due to CAD -Intolerant to atorvastatin and simvastatinand had confusion and diarrhea with crestor -Plan was to increasepravastatin to 80mg  daily>>tolerating well  -Has follow lipid panel with PCP next week   4. HTN -Stable, 128/62 -Continue Lisinopril 5mg  daily -Check labs today   Current medicines are reviewed at length with the patient today.  The patient does not have concerns regarding medicines.  The following changes have been made:  Add SL NTG PRN   Labs/ tests ordered today include: BMET  Orders Placed This Encounter  Procedures  . Basic metabolic panel    Disposition:   FU with Dr. Radford [name] in 3 months  Signed, Kathyrn Drown, NP  02/02/2021 2:45 PM    [facility] [place], [place], [place]  [address] Phone: [phone]; Fax: [phone]

## 2021-02-02 ENCOUNTER — Encounter: Payer: Self-pay | Admitting: Cardiology

## 2021-02-02 ENCOUNTER — Ambulatory Visit (INDEPENDENT_AMBULATORY_CARE_PROVIDER_SITE_OTHER): Payer: Medicare Other | Admitting: Cardiology

## 2021-02-02 ENCOUNTER — Other Ambulatory Visit: Payer: Self-pay

## 2021-02-02 VITALS — BP 128/62 | HR 73 | Ht 68.0 in | Wt 206.8 lb

## 2021-02-02 DIAGNOSIS — R079 Chest pain, unspecified: Secondary | ICD-10-CM

## 2021-02-02 DIAGNOSIS — E7841 Elevated Lipoprotein(a): Secondary | ICD-10-CM

## 2021-02-02 DIAGNOSIS — I25729 Atherosclerosis of autologous artery coronary artery bypass graft(s) with unspecified angina pectoris: Secondary | ICD-10-CM | POA: Diagnosis not present

## 2021-02-02 DIAGNOSIS — E785 Hyperlipidemia, unspecified: Secondary | ICD-10-CM

## 2021-02-02 DIAGNOSIS — Z9889 Other specified postprocedural states: Secondary | ICD-10-CM | POA: Diagnosis not present

## 2021-02-02 DIAGNOSIS — I10 Essential (primary) hypertension: Secondary | ICD-10-CM | POA: Diagnosis not present

## 2021-02-02 MED ORDER — NITROGLYCERIN 0.4 MG SL SUBL: 0.4000 mg | SUBLINGUAL_TABLET | SUBLINGUAL | 3 refills | Status: DC | PRN

## 2021-02-02 NOTE — Patient Instructions (Signed)
Medication Instructions:  Start Nitroglycerin 0.4 mg every 5 minutes as needed for chest pain   *If you need a refill on your cardiac medications before your next appointment, please call your pharmacy*   Lab Work: BMP- Today   If you have labs (blood work) drawn today and your tests are completely normal, you will receive your results only by: Marland Kitchen MyChart Message (if you have MyChart) OR . A paper copy in the mail If you have any lab test that is abnormal or we need to change your treatment, we will call you to review the results.   Testing/Procedures: None ordered    Follow-Up: At [facility], you and your health needs are our priority.  As part of our continuing mission to provide you with exceptional heart care, we have created designated Provider Care Teams.  These Care Teams include your primary Cardiologist (physician) and Advanced Practice Providers (APPs -  Physician Assistants and Nurse Practitioners) who all work together to provide you with the care you need, when you need it.  We recommend signing up for the patient portal called "MyChart".  Sign up information is provided on this After Visit Summary.  MyChart is used to connect with patients for Virtual Visits (Telemedicine).  Patients are able to view lab/test results, encounter notes, upcoming appointments, etc.  Non-urgent messages can be sent to your provider as well.   To learn more about what you can do with MyChart, go to [email or web address].    Your next appointment:   3 month(s)  The format for your next appointment:   In Person  Provider:   You may see Dr. Fransico Him or one of the following Advanced Practice Providers on your designated Care Team:    Melina Copa, PA-C  Ermalinda Barrios, PA-C    Other Instructions Nitroglycerin sublingual tablets What is this medicine? NITROGLYCERIN (nye troe GLI ser in) is a type of vasodilator. It relaxes blood vessels, increasing the blood and oxygen supply to  your heart. This medicine is used to relieve chest pain caused by angina. It is also used to prevent chest pain before activities like climbing stairs, going outdoors in cold weather, or sexual activity. This medicine may be used for other purposes; ask your health care provider or pharmacist if you have questions. COMMON BRAND NAME(S): Nitroquick, Nitrostat, Nitrotab What should I tell my health care provider before I take this medicine? They need to know if you have any of these conditions:  anemia  head injury, recent stroke, or bleeding in the brain  liver disease  previous heart attack  an unusual or allergic reaction to nitroglycerin, other medicines, foods, dyes, or preservatives  pregnant or trying to get pregnant  breast-feeding How should I use this medicine? Take this medicine by mouth as needed. Use at the first sign of an angina attack (chest pain or tightness). You can also take this medicine 5 to 10 minutes before an event likely to produce chest pain. Follow the directions exactly as written on the prescription label. Place one tablet under your tongue and let it dissolve. Do not swallow whole. Replace the dose if you accidentally swallow it. It will help if your mouth is not dry. Saliva around the tablet will help it to dissolve more quickly. Do not eat or drink, smoke or chew tobacco while a tablet is dissolving. Sit down when taking this medicine. In an angina attack, you should feel better within 5 minutes after your first dose. You can  take a dose every 5 minutes up to a total of 3 doses. If you do not feel better or feel worse after 1 dose, call 9-1-1 at once. Do not take more than 3 doses in 15 minutes. Your health care provider might give you other directions. Follow those directions if he or she does. Do not take your medicine more often than directed. Talk to your health care provider about the use of this medicine in children. Special care may be needed. Overdosage:  If you think you have taken too much of this medicine contact a poison control center or emergency room at once. NOTE: This medicine is only for you. Do not share this medicine with others. What if I miss a dose? This does not apply. This medicine is only used as needed. What may interact with this medicine? Do not take this medicine with any of the following medications:  certain migraine medicines like ergotamine and dihydroergotamine (DHE)  medicines used to treat erectile dysfunction like sildenafil, tadalafil, and vardenafil  riociguat This medicine may also interact with the following medications:  alteplase  aspirin  heparin  medicines for high blood pressure  medicines for mental depression  other medicines used to treat angina  phenothiazines like chlorpromazine, mesoridazine, prochlorperazine, thioridazine This list may not describe all possible interactions. Give your health care provider a list of all the medicines, herbs, non-prescription drugs, or dietary supplements you use. Also tell them if you smoke, drink alcohol, or use illegal drugs. Some items may interact with your medicine. What should I watch for while using this medicine? Tell your doctor or health care professional if you feel your medicine is no longer working. Keep this medicine with you at all times. Sit or lie down when you take your medicine to prevent falling if you feel dizzy or faint after using it. Try to remain calm. This will help you to feel better faster. If you feel dizzy, take several deep breaths and lie down with your feet propped up, or bend forward with your head resting between your knees. You may get drowsy or dizzy. Do not drive, use machinery, or do anything that needs mental alertness until you know how this drug affects you. Do not stand or sit up quickly, especially if you are an older patient. This reduces the risk of dizzy or fainting spells. Alcohol can make you more drowsy and  dizzy. Avoid alcoholic drinks. Do not treat yourself for coughs, colds, or pain while you are taking this medicine without asking your doctor or health care professional for advice. Some ingredients may increase your blood pressure. What side effects may I notice from receiving this medicine? Side effects that you should report to your doctor or health care professional as soon as possible:  allergic reactions (skin rash, itching or hives; swelling of the face, lips, or tongue)  low blood pressure (dizziness; feeling faint or lightheaded, falls; unusually weak or tired)  low red blood cell counts (trouble breathing; feeling faint; lightheaded, falls; unusually weak or tired) Side effects that usually do not require medical attention (report to your doctor or health care professional if they continue or are bothersome):  facial flushing (redness)  headache  nausea, vomiting This list may not describe all possible side effects. Call your doctor for medical advice about side effects. You may report side effects to FDA at 1-800-FDA-1088. Where should I keep my medicine? Keep out of the reach of children. Store at room temperature between 20 and  25 degrees C (68 and 77 degrees F). Store in [facility] [employer]. Protect from light and moisture. Keep tightly closed. Throw away any unused medicine after the expiration date. NOTE: This sheet is a summary. It may not cover all possible information. If you have questions about this medicine, talk to your doctor, pharmacist, or health care provider.  2021 Elsevier/Gold Standard (2018-09-09 16:46:32)

## 2021-02-03 LAB — BASIC METABOLIC PANEL
BUN/Creatinine Ratio: 16 (ref 10–24)
BUN: 17 mg/dL (ref 8–27)
CO2: 24 mmol/L (ref 20–29)
Calcium: 9.8 mg/dL (ref 8.6–10.2)
Chloride: 102 mmol/L (ref 96–106)
Creatinine, Ser: 1.07 mg/dL (ref 0.76–1.27)
GFR calc Af Amer: 83 mL/min/{1.73_m2} (ref >59)
GFR calc non Af Amer: 71 mL/min/{1.73_m2} (ref >59)
Glucose: 105 mg/dL — ABNORMAL HIGH (ref 65–99)
Potassium: 4.3 mmol/L (ref 3.5–5.2)
Sodium: 141 mmol/L (ref 134–144)

## 2021-02-20 ENCOUNTER — Encounter: Payer: Self-pay | Admitting: Physical Therapy

## 2021-02-28 ENCOUNTER — Ambulatory Visit (INDEPENDENT_AMBULATORY_CARE_PROVIDER_SITE_OTHER): Payer: Medicare Other | Admitting: Family Medicine

## 2021-02-28 ENCOUNTER — Other Ambulatory Visit: Payer: Self-pay

## 2021-02-28 ENCOUNTER — Encounter: Payer: Self-pay | Admitting: Family Medicine

## 2021-02-28 VITALS — BP 112/74 | HR 77 | Temp 98.2°F | Resp 18 | Ht 68.0 in | Wt 209.6 lb

## 2021-02-28 DIAGNOSIS — K5792 Diverticulitis of intestine, part unspecified, without perforation or abscess without bleeding: Secondary | ICD-10-CM | POA: Diagnosis not present

## 2021-02-28 DIAGNOSIS — Z974 Presence of external hearing-aid: Secondary | ICD-10-CM | POA: Diagnosis not present

## 2021-02-28 MED ORDER — AMOXICILLIN-POT CLAVULANATE 875-125 MG PO TABS: 1.0000 | ORAL_TABLET | Freq: Two times a day (BID) | ORAL | 0 refills | Status: DC

## 2021-02-28 NOTE — Patient Instructions (Addendum)
Take a probiotic with the medicine. Take it with food also.  If you start having fevers or worsening symptoms, seek emergent care.  If you do not hear anything about your referral in the next 1-2 weeks, call our office and ask for an update.  Let us know if you need anything.

## 2021-02-28 NOTE — Progress Notes (Signed)
Chief Complaint  Patient presents with  . Diverticulitis    Onset: 2 days , LLQ .     Subjective: Patient is a 70 y.o. male here for possible diverticulitis.  Started 2 d ago w LLQ pain. No getting any better. 5/10 sharp pain, worse when he presses down on it. Associated diarrhea. No N/V/C, bleeding, fevers, injury. No known triggers. He has a hx of diverticulitis.   Pt w hx of hearing loss. He wears hearing aids. Requesting a referral to ENT/audiology on [address]. No recent hearing loss or ear pain/drainage.  Past Medical History:  Diagnosis Date  . Arthritis   . Articular cartilage disease    left shoulder  . Diverticulitis   . DNR (do not resuscitate) 01/16/2021  . Dyslipidemia (high LDL; low HDL) 08/15/2016  . GERD (gastroesophageal reflux disease)   . Hearing problem    hearing deficit  . Hemorrhoids   . Hypertension   . Hypothyroidism   . Prostate cancer ([employer]) 2015   treated and in remission  . Sleep apnea    uses a cpap  . Wears glasses   . Wears hearing aid    both ears    Objective: BP 112/74   Pulse 77   Temp 98.2 F (36.8 C)   Resp 18   Ht 5\' 8"  (1.727 m)   Wt 209 lb 9.6 oz (95.1 kg)   SpO2 99%   BMI 31.87 kg/m  General: Awake, appears stated age HEENT: MMM Abd: BS+, S, TTP in LLQ, neg Murphy's, Rovsing's, McBurney's. Heart: RRR Lungs: CTAB, no rales, wheezes or rhonchi. No accessory muscle use Psych: Age appropriate judgment and insight, normal affect and mood  Assessment and Plan: Acute diverticulitis - Plan: amoxicillin-clavulanate (AUGMENTIN) 875-125 MG tablet  Hearing aid worn - Plan: Ambulatory referral to ENT  1. 10 d, bid Augmentin. If s/s's worsen, seek emergent care. Stay hydrated. No suggestion of abscess today, he is able to eat/drink. 2. Refer ENT at request. The patient voiced understanding and agreement to the plan.  [place], DO 02/28/21  7:16 AM

## 2021-03-05 ENCOUNTER — Other Ambulatory Visit: Payer: Self-pay

## 2021-03-05 ENCOUNTER — Other Ambulatory Visit (INDEPENDENT_AMBULATORY_CARE_PROVIDER_SITE_OTHER): Payer: Medicare Other

## 2021-03-05 DIAGNOSIS — I25729 Atherosclerosis of autologous artery coronary artery bypass graft(s) with unspecified angina pectoris: Secondary | ICD-10-CM | POA: Diagnosis not present

## 2021-03-05 LAB — HEPATIC FUNCTION PANEL
ALT: 22 U/L (ref 0–53)
AST: 18 U/L (ref 0–37)
Albumin: 4.2 g/dL (ref 3.5–5.2)
Alkaline Phosphatase: 56 U/L (ref 39–117)
Bilirubin, Direct: 0.1 mg/dL (ref 0.0–0.3)
Total Bilirubin: 0.5 mg/dL (ref 0.2–1.2)
Total Protein: 6.6 g/dL (ref 6.0–8.3)

## 2021-03-05 LAB — LIPID PANEL
Cholesterol: 113 mg/dL (ref 0–200)
HDL: 32.5 mg/dL — ABNORMAL LOW (ref >39.00)
LDL Cholesterol: 52 mg/dL (ref 0–99)
NonHDL: 80.09
Total CHOL/HDL Ratio: 3
Triglycerides: 142 mg/dL (ref 0.0–149.0)
VLDL: 28.4 mg/dL (ref 0.0–40.0)

## 2021-03-05 MED ORDER — PRAVASTATIN SODIUM 80 MG PO TABS: 80.0000 mg | ORAL_TABLET | Freq: Every day | ORAL | 1 refills | Status: DC

## 2021-03-05 NOTE — Telephone Encounter (Signed)
Patient was given Augmentin 875-125 mg bid on 02/28/2021 for acute diverticulitis

## 2021-04-01 ENCOUNTER — Other Ambulatory Visit: Payer: Self-pay | Admitting: Family Medicine

## 2021-04-05 ENCOUNTER — Ambulatory Visit: Payer: Medicare Other | Attending: Internal Medicine

## 2021-04-05 DIAGNOSIS — Z23 Encounter for immunization: Secondary | ICD-10-CM

## 2021-04-05 NOTE — Progress Notes (Signed)
   Covid-19 Vaccination Clinic  Name:  Zachary [name]    MRN: [id number] DOB: [birth year]  04/05/2021  [name] was observed post Covid-19 immunization for 15 minutes without incident. He was provided with Vaccine Information Sheet and instruction to access the V-Safe system.   [name] was instructed to call 911 with any severe reactions post vaccine: Marland Kitchen Difficulty breathing  . Swelling of face and throat  . A fast heartbeat  . A bad rash all over body  . Dizziness and weakness   Immunizations Administered    Name Date Dose VIS Date Route   PFIZER Comrnaty(Gray TOP) Covid-19 Vaccine 04/05/2021  9:59 AM 0.3 mL 11/30/2020 Intramuscular   Manufacturer: [employer], [employer]   Lot: [id number]   NDC: [phone]

## 2021-04-07 ENCOUNTER — Other Ambulatory Visit: Payer: Self-pay | Admitting: Family Medicine

## 2021-04-09 ENCOUNTER — Other Ambulatory Visit (HOSPITAL_BASED_OUTPATIENT_CLINIC_OR_DEPARTMENT_OTHER): Payer: Self-pay

## 2021-04-09 DIAGNOSIS — Z23 Encounter for immunization: Secondary | ICD-10-CM | POA: Diagnosis not present

## 2021-04-09 MED ORDER — PFIZER-BIONT COVID-19 VAC-TRIS 30 MCG/0.3ML IM SUSP
INTRAMUSCULAR | 0 refills | Status: DC
Start: 2021-04-05 — End: 2021-04-30
  Filled 2021-04-09: qty 0.3, 1d supply, fill #0

## 2021-04-21 ENCOUNTER — Other Ambulatory Visit: Payer: Self-pay | Admitting: Family Medicine

## 2021-04-21 DIAGNOSIS — K219 Gastro-esophageal reflux disease without esophagitis: Secondary | ICD-10-CM

## 2021-04-30 ENCOUNTER — Ambulatory Visit (INDEPENDENT_AMBULATORY_CARE_PROVIDER_SITE_OTHER): Payer: Medicare Other | Admitting: Cardiology

## 2021-04-30 ENCOUNTER — Encounter: Payer: Self-pay | Admitting: Cardiology

## 2021-04-30 ENCOUNTER — Other Ambulatory Visit: Payer: Self-pay

## 2021-04-30 VITALS — BP 128/80 | HR 70 | Ht 68.0 in | Wt 205.6 lb

## 2021-04-30 DIAGNOSIS — I25729 Atherosclerosis of autologous artery coronary artery bypass graft(s) with unspecified angina pectoris: Secondary | ICD-10-CM

## 2021-04-30 DIAGNOSIS — E7800 Pure hypercholesterolemia, unspecified: Secondary | ICD-10-CM | POA: Diagnosis not present

## 2021-04-30 DIAGNOSIS — I10 Essential (primary) hypertension: Secondary | ICD-10-CM | POA: Diagnosis not present

## 2021-04-30 DIAGNOSIS — I200 Unstable angina: Secondary | ICD-10-CM

## 2021-04-30 MED ORDER — LISINOPRIL 5 MG PO TABS: 5.0000 mg | ORAL_TABLET | Freq: Every day | ORAL | 3 refills | Status: DC

## 2021-04-30 NOTE — Progress Notes (Signed)
Cardiology Office Note   Date:  04/30/2021   ID:  Zachary [name], DOB Jun 23, 1951, MRN [id number]  PCP:  [name], DO  Cardiologist:  Dr. Fransico Him, MD   Chief Complaint  Patient presents with  . Coronary Artery Disease  . Hypertension  . Hyperlipidemia    History of Present Illness: Zachary [name] is a 70 y.o. male with a hx of HLD, GERD, HTN, TIA, Prostate CA, Hypothyroidism and OSA on CPAP and LHC in 2011 with mild non obstructive disease of the LAD. He was in his usual state of health when he developed chest pain while working in his garage in Jan 2022. He has a fm hx of CAD with his father dying of an MI therefore his wife brought him to the ER for evaluation.     In ER he was given NTG SLG which improved his symptoms. HsT 6>7>8>6. Given hx and CV risk factors, he underwent LHC 01/16/21 which showed non-obstructive CAD with mLAD 20% and mLCX 30% and non dominant RCA and normal LVF with normal LVEDP. He was continued on ASA and pravastatin 80.   He is here today for followup and is doing well.  He denies any chest pain or pressure, SOB, DOE, PND, orthopnea, LE edema, palpitations or syncope. He occasionally has some dizziness if he has not stayed hydrated and works in the heat.  He is compliant with his meds and is tolerating meds with no SE.    Past Medical History:  Diagnosis Date  . Arthritis   . Articular cartilage disease    left shoulder  . Diverticulitis   . DNR (do not resuscitate) 01/16/2021  . Dyslipidemia (high LDL; low HDL) 08/15/2016  . GERD (gastroesophageal reflux disease)   . Hearing problem    hearing deficit  . Hemorrhoids   . Hypertension   . Hypothyroidism   . Prostate cancer ([place]) 2015   treated and in remission  . Sleep apnea    uses a cpap  . Wears glasses   . Wears hearing aid    both ears    Past Surgical History:  Procedure Laterality Date  . BACK SURGERY  1978   herniated disksurgery  . CARDIAC CATHETERIZATION   08/01/2010  . COLONOSCOPY    . EXAM UNDER ANESTHESIA WITH MANIPULATION OF SHOULDER Right 09/15/2014   Procedure: RIGHT SHOULDER MANIPULATION UNDER ANESTHESIA;  Surgeon: Ninetta Lights, MD;  Location: [facility];  Service: Orthopedics;  Laterality: Right;  . HEMORRHOID SURGERY  08/2006  . LEFT HEART CATH AND CORONARY ANGIOGRAPHY N/A 01/16/2021   Procedure: LEFT HEART CATH AND CORONARY ANGIOGRAPHY;  Surgeon: Troy Sine, MD;  Location: [facility];  Service: Cardiovascular;  Laterality: N/A;  . SHOULDER ARTHROSCOPY WITH DISTAL CLAVICLE RESECTION Left 07/07/2015   Procedure: SHOULDER ARTHROSCOPY WITH DISTAL CLAVICLE RESECTION;  Surgeon: Kathryne Hitch, MD;  Location: [facility];  Service: Orthopedics;  Laterality: Left;  . SHOULDER ARTHROSCOPY WITH ROTATOR CUFF REPAIR Left 12/07/2015   Procedure: LEFT SHOULDER ARTHROSCOPY DEBRIDEMENT, WITH ROTATOR CUFF REPAIR;  Surgeon: Ninetta Lights, MD;  Location: [facility];  Service: Orthopedics;  Laterality: Left;  . SHOULDER ARTHROSCOPY WITH ROTATOR CUFF REPAIR AND SUBACROMIAL DECOMPRESSION Left 07/07/2015   Procedure: LEFT SHOULDER SCOPE DEBRIDEMENT, SUBACROMIAL DECOMPRESSION, DISTAL CLAVICULECTOMY, ROTATOR CUFF REPAIR  ;  Surgeon: Kathryne Hitch, MD;  Location: [facility];  Service: Orthopedics;  Laterality: Left;  ANESTHESIA: GENERAL, PRE/POST  OP SCALENE  . SHOULDER ARTHROSCOPY WITH SUBACROMIAL DECOMPRESSION, ROTATOR CUFF REPAIR AND BICEP TENDON REPAIR Right 03/10/2014   Procedure: RIGHT SHOULDER ARTHROSCOPY WITH SUBACROMIAL DECOMPRESSION, PARTIAL ACROMIOPLASTY WITH CORACOAROMIAL LABRUM DEBRIDEMENT RELEASE DISTAL CLAVICULECTOMY,  ROTATOR CUFF REPAIR AND EXTENSIVE DEBRIDEMENT;  Surgeon: Ninetta Lights, MD;  Location: [facility];  Service: Orthopedics;  Laterality: Right;  . TENDON REPAIR  June 06, 2011   right elbow, Dr. Percell Miller     Current Outpatient Medications  Medication  Sig Dispense Refill  . aspirin EC 81 MG EC tablet Take 81 mg by mouth daily.    Marland Kitchen levothyroxine (SYNTHROID) 100 MCG tablet Take 1 tablet by mouth once daily 90 tablet 0  . lisinopril (ZESTRIL) 5 MG tablet Take 1 tablet by mouth once daily 90 tablet 0  . loratadine (CLARITIN) 10 MG tablet Take 10 mg by mouth daily.    . Melatonin 10 MG CAPS Take by mouth.    . nitroGLYCERIN (NITROSTAT) 0.4 MG SL tablet Place 1 tablet (0.4 mg total) under the tongue every 5 (five) minutes as needed for chest pain. 25 tablet 3  . omeprazole (PRILOSEC) 20 MG capsule Take 1 capsule by mouth once daily 90 capsule 0  . pravastatin (PRAVACHOL) 80 MG tablet Take 1 tablet (80 mg total) by mouth daily at 6 PM. 90 tablet 1  . sodium bicarbonate 650 MG tablet Take 1 tablet by mouth once daily 90 tablet 0   No current facility-administered medications for this visit.    Allergies:   Oxycodone, Atorvastatin, and Simvastatin    Social History:  The patient  reports that he has never smoked. He has never used smokeless tobacco. He reports current alcohol use. He reports that he does not use drugs.   Family History:  The patient's family history includes Alcohol abuse in an other family member; Arthritis in an other family member; Breast cancer in his mother; Cancer in his brother and another family member; Coronary artery disease in an other family member; Cystic fibrosis in an other family member; Heart disease (age of onset: 41) in his father; Irritable bowel syndrome in an other family member.    ROS:  Please see the history of present illness. Otherwise, review of systems are positive for none.  All other systems are reviewed and negative.    PHYSICAL EXAM: VS:  BP 128/80   Pulse 70   Ht 5\' 8"  (1.727 m)   Wt 205 lb 9.6 oz (93.3 kg)   SpO2 98%   BMI 31.26 kg/m  , BMI Body mass index is 31.26 kg/m.   GEN: Well nourished, well developed in no acute distress HEENT: Normal NECK: No JVD; No carotid  bruits LYMPHATICS: No lymphadenopathy CARDIAC:RRR, no murmurs, rubs, gallops RESPIRATORY:  Clear to auscultation without rales, wheezing or rhonchi  ABDOMEN: Soft, non-tender, non-distended MUSCULOSKELETAL:  No edema; No deformity  SKIN: Warm and dry NEUROLOGIC:  Alert and oriented x 3 PSYCHIATRIC:  Normal affect   EKG:  EKG is not ordered today.   Recent Labs: 01/16/2021: TSH 3.656 01/17/2021: Hemoglobin 14.5; Platelets 218 02/02/2021: BUN 17; Creatinine, Ser 1.07; Potassium 4.3; Sodium 141 03/05/2021: ALT 22   Lipid Panel    Component Value Date/Time   CHOL 113 03/05/2021 0737   TRIG 142.0 03/05/2021 0737   HDL 32.50 (L) 03/05/2021 0737   CHOLHDL 3 03/05/2021 0737   VLDL 28.4 03/05/2021 0737   LDLCALC 52 03/05/2021 0737   LDLDIRECT 82.0 04/14/2020 8841  Wt Readings from Last 3 Encounters:  04/30/21 205 lb 9.6 oz (93.3 kg)  02/28/21 209 lb 9.6 oz (95.1 kg)  02/02/21 206 lb 12.8 oz (93.8 kg)     Other studies Reviewed: Additional studies/ records that were reviewed today include:  Review of the above records demonstrates:   Cardiac Cath 01/16/2021 Conclusion    Prox Cx to Mid Cx lesion is 30% stenosed.  Mid LAD lesion is 20% stenosed.  Mild nonobstructive CAD with a 20% smooth stenosis in the mid LAD; mild 30% focal stenosis in the mid circumflex vessel; and a normal dominant RCA.   Normal LV function with EF estimated 55 to 60% without focal segmental wall motion abnormalities. LVEDP 30 mmHg.  RECOMMENDATION: Medical therapy. Aggressive lipid-lowering therapy with target LDL less than 70%.  Echo 01/16/2021  1. Left ventricular ejection fraction, by estimation, is 50 to 55%. The  left ventricle has low normal function. The left ventricle has no regional  wall motion abnormalities. Left ventricular diastolic parameters are  consistent with Grade I diastolic  dysfunction (impaired relaxation).  2. Right ventricular systolic function is normal. The  right ventricular  size is normal.  3. The mitral valve is normal in structure. Trivial mitral valve  regurgitation. No evidence of mitral stenosis.  4. The aortic valve is tricuspid. Aortic valve regurgitation is trivial.  Mild aortic valve sclerosis is present, with no evidence of aortic valve  stenosis.   ASSESSMENT AND PLAN:  1.ASCAD -Presented to the ED after an episode of chest pain with a negative EKG and HsT level.  -LHC 12/2020 showed mild non obstructive CAD with mLAD 20% and mLCX 30% and non dominant RCA and normal LVF with normal LVEDP -Medical management was recommended with ASA, high intensity statin  -Echo with low normal LVEF>>follow with surveillance study if symptoms  -he has not had any anginal symptoms since I saw him last -he will continue on ASA and high intensity statn  2. HLD: -LDL, 90 on admission with LDL goal <70 due to CAD -Intolerant to atorvastatin and simvastatinand had confusion and diarrhea with crestor -labs done by PCP reviewed from 02/2021 and LDL is at goal at 52 -continue pravastatin 80mg  daily>>I have refilled this for 1 year  4. HTN -BP is well controlled on exam today -Continue Lisinopril 5mg  daily -I refilled his Lisinopril today -labs from PCP reviewed and showed stable SCr of 1.07 and K+ 4.3 from Feb 2022  Current medicines are reviewed at length with the patient today.  The patient does not have concerns regarding medicines.  The following changes have been made: none  Labs/ tests ordered today include: None  No orders of the defined types were placed in this encounter.   Disposition:   FU with 1 year  Signed, Fransico Him, MD  04/30/2021 2:38 PM    [facility] [address] [address], [place], [place]  [address] Phone: [phone]; Fax: [phone]

## 2021-04-30 NOTE — Addendum Note (Signed)
Addended by: Antonieta Iba on: 04/30/2021 02:44 PM   Modules accepted: Orders

## 2021-04-30 NOTE — Patient Instructions (Signed)

## 2021-05-03 DIAGNOSIS — H903 Sensorineural hearing loss, bilateral: Secondary | ICD-10-CM | POA: Diagnosis not present

## 2021-05-16 ENCOUNTER — Other Ambulatory Visit: Payer: Self-pay

## 2021-05-16 ENCOUNTER — Ambulatory Visit (INDEPENDENT_AMBULATORY_CARE_PROVIDER_SITE_OTHER): Payer: Medicare Other | Admitting: Family Medicine

## 2021-05-16 ENCOUNTER — Encounter: Payer: Self-pay | Admitting: Family Medicine

## 2021-05-16 VITALS — BP 138/84 | HR 62 | Temp 97.8°F | Ht 68.0 in | Wt 207.4 lb

## 2021-05-16 DIAGNOSIS — L821 Other seborrheic keratosis: Secondary | ICD-10-CM

## 2021-05-16 DIAGNOSIS — B079 Viral wart, unspecified: Secondary | ICD-10-CM

## 2021-05-16 NOTE — Patient Instructions (Addendum)
These don't look like anything sinister.  If you would like to see a dermatologist for reassurance, please let me know.  Let us know if you need anything.   Seborrheic Keratosis A seborrheic keratosis is a common, noncancerous (benign) skin growth. These growths are velvety, waxy, rough, tan, brown, or black spots that appear on the skin. These skin growths can be flat or raised, and scaly. What are the causes? The cause of this condition is not known. What increases the risk? You are more likely to develop this condition if you:  Have a family history of seborrheic keratosis.  Are 50 or older.  Are pregnant.  Have had estrogen replacement therapy. What are the signs or symptoms? Symptoms of this condition include growths on the face, chest, shoulders, back, or other areas. These growths:  Are usually painless, but may become irritated and itchy.  Can be yellow, brown, black, or other colors.  Are slightly raised or have a flat surface.  Are sometimes rough or wart-like in texture.  Are often velvety or waxy on the surface.  Are round or oval-shaped.  Often occur in groups, but may occur as a single growth.   How is this diagnosed? This condition is diagnosed with a medical history and physical exam.  A sample of the growth may be tested (skin biopsy).  You may need to see a skin specialist (dermatologist). How is this treated? Treatment is not usually needed for this condition, unless the growths are irritated or bleed often.  You may also choose to have the growths removed if you do not like their appearance. ? Most commonly, these growths are treated with a procedure in which liquid nitrogen is applied to "freeze" off the growth (cryosurgery). ? They may also be burned off with electricity (electrocautery) or removed by scraping (curettage). Follow these instructions at home:  Watch your growth for any changes.  Keep all follow-up visits as told by your health  care provider. This is important.  Do not scratch or pick at the growth or growths. This can cause them to become irritated or infected. Contact a health care provider if:  You suddenly have many new growths.  Your growth bleeds, itches, or hurts.  Your growth suddenly becomes larger or changes color. Summary  A seborrheic keratosis is a common, noncancerous (benign) skin growth.  Treatment is not usually needed for this condition, unless the growths are irritated or bleed often.  Watch your growth for any changes.  Contact a health care provider if you suddenly have many new growths or your growth suddenly becomes larger or changes color.  Keep all follow-up visits as told by your health care provider. This is important. This information is not intended to replace advice given to you by your health care provider. Make sure you discuss any questions you have with your health care provider. Document Revised: 04/23/2018 Document Reviewed: 04/23/2018 Elsevier Patient Education  2021 [employer].

## 2021-05-16 NOTE — Progress Notes (Signed)
Chief Complaint  Patient presents with  . spot on head is suspicious looking    Zachary [name] is a 70 y.o. male here for a skin complaint.  Duration: 1 month Location: Around scalp, in front of right ear, behind right ear Pruritic? No Painful? No Drainage? No New soaps/lotions/topicals/detergents? No Other associated symptoms: Feels like it is growing Therapies tried thus far: none  Past Medical History:  Diagnosis Date  . Arthritis   . Articular cartilage disease    left shoulder  . Diverticulitis   . DNR (do not resuscitate) 01/16/2021  . Dyslipidemia (high LDL; low HDL) 08/15/2016  . GERD (gastroesophageal reflux disease)   . Hearing problem    hearing deficit  . Hemorrhoids   . Hypertension   . Hypothyroidism   . Prostate cancer ([place]) 2015   treated and in remission  . Sleep apnea    uses a cpap  . Wears glasses   . Wears hearing aid    both ears    BP 138/84 (BP Location: Left Arm, Patient Position: Sitting, Cuff Size: Normal)   Pulse 62   Temp 97.8 F (36.6 C) (Oral)   Ht 5\' 8"  (1.727 m)   Wt 207 lb 6 oz (94.1 kg)   SpO2 97%   BMI 31.53 kg/m  Gen: awake, alert, appearing stated age Lungs: No accessory muscle use Skin: Preauricular hyperpigmented and raised lesion that appears pasted on without scaling, erythema, tenderness to palpation, or excoriation.  There is a similar lesion postauricularly.  Over the scalp, there is slightly raised and abnormally textured lesions along the scalp measuring approximately 3 to 4 mm.  There is no drainage, scaling, erythema, varicosities, fluctuance, tenderness. Psych: Age appropriate judgment and insight.  [place]  Seborrheic keratoses  I think you have warts on his scalp and 2 seborrheic keratoses around his ear.  I gave him reassurance.  I did offer to refer him to dermatology for reassurance should he change his mind. F/u prn. The patient voiced understanding and agreement to the plan.  [place], DO 05/16/21 9:45 AM

## 2021-06-11 DIAGNOSIS — H2513 Age-related nuclear cataract, bilateral: Secondary | ICD-10-CM | POA: Diagnosis not present

## 2021-06-30 ENCOUNTER — Other Ambulatory Visit: Payer: Self-pay | Admitting: Family Medicine

## 2021-07-07 ENCOUNTER — Other Ambulatory Visit: Payer: Self-pay | Admitting: Family Medicine

## 2021-07-21 ENCOUNTER — Other Ambulatory Visit: Payer: Self-pay | Admitting: Family Medicine

## 2021-07-21 DIAGNOSIS — K219 Gastro-esophageal reflux disease without esophagitis: Secondary | ICD-10-CM

## 2021-08-10 ENCOUNTER — Emergency Department (HOSPITAL_BASED_OUTPATIENT_CLINIC_OR_DEPARTMENT_OTHER)
Admission: EM | Admit: 2021-08-10 | Discharge: 2021-08-10 | Disposition: A | Discharge status: Home / Self Care | Payer: Medicare Other | Attending: Emergency Medicine | Admitting: Emergency Medicine

## 2021-08-10 ENCOUNTER — Telehealth: Payer: Self-pay

## 2021-08-10 ENCOUNTER — Encounter (HOSPITAL_BASED_OUTPATIENT_CLINIC_OR_DEPARTMENT_OTHER): Payer: Self-pay | Admitting: *Deleted

## 2021-08-10 ENCOUNTER — Emergency Department (HOSPITAL_BASED_OUTPATIENT_CLINIC_OR_DEPARTMENT_OTHER): Payer: Medicare Other

## 2021-08-10 ENCOUNTER — Other Ambulatory Visit: Payer: Self-pay

## 2021-08-10 DIAGNOSIS — E039 Hypothyroidism, unspecified: Secondary | ICD-10-CM | POA: Insufficient documentation

## 2021-08-10 DIAGNOSIS — Z79899 Other long term (current) drug therapy: Secondary | ICD-10-CM | POA: Diagnosis not present

## 2021-08-10 DIAGNOSIS — Z7982 Long term (current) use of aspirin: Secondary | ICD-10-CM | POA: Diagnosis not present

## 2021-08-10 DIAGNOSIS — R109 Unspecified abdominal pain: Secondary | ICD-10-CM | POA: Diagnosis not present

## 2021-08-10 DIAGNOSIS — K5792 Diverticulitis of intestine, part unspecified, without perforation or abscess without bleeding: Secondary | ICD-10-CM

## 2021-08-10 DIAGNOSIS — Z8546 Personal history of malignant neoplasm of prostate: Secondary | ICD-10-CM | POA: Insufficient documentation

## 2021-08-10 DIAGNOSIS — I10 Essential (primary) hypertension: Secondary | ICD-10-CM | POA: Diagnosis not present

## 2021-08-10 DIAGNOSIS — R1031 Right lower quadrant pain: Secondary | ICD-10-CM | POA: Diagnosis present

## 2021-08-10 LAB — COMPREHENSIVE METABOLIC PANEL
ALT: 24 U/L (ref 0–44)
AST: 21 U/L (ref 15–41)
Albumin: 4.6 g/dL (ref 3.5–5.0)
Alkaline Phosphatase: 55 U/L (ref 38–126)
Anion gap: 10 (ref 5–15)
BUN: 14 mg/dL (ref 8–23)
CO2: 24 mmol/L (ref 22–32)
Calcium: 9.7 mg/dL (ref 8.9–10.3)
Chloride: 100 mmol/L (ref 98–111)
Creatinine, Ser: 1.03 mg/dL (ref 0.61–1.24)
GFR, Estimated: >60 mL/min (ref >60)
Glucose, Bld: 112 mg/dL — ABNORMAL HIGH (ref 70–99)
Potassium: 4.1 mmol/L (ref 3.5–5.1)
Sodium: 134 mmol/L — ABNORMAL LOW (ref 135–145)
Total Bilirubin: 0.8 mg/dL (ref 0.3–1.2)
Total Protein: 7.4 g/dL (ref 6.5–8.1)

## 2021-08-10 LAB — CBC WITH DIFFERENTIAL/PLATELET
Abs Immature Granulocytes: 0.04 10*3/uL (ref 0.00–0.07)
Basophils Absolute: 0 10*3/uL (ref 0.0–0.1)
Basophils Relative: 0 %
Eosinophils Absolute: 0.4 10*3/uL (ref 0.0–0.5)
Eosinophils Relative: 4 %
HCT: 45.1 % (ref 39.0–52.0)
Hemoglobin: 15.9 g/dL (ref 13.0–17.0)
Immature Granulocytes: 0 %
Lymphocytes Relative: 28 %
Lymphs Abs: 2.7 10*3/uL (ref 0.7–4.0)
MCH: 31.9 pg (ref 26.0–34.0)
MCHC: 35.3 g/dL (ref 30.0–36.0)
MCV: 90.4 fL (ref 80.0–100.0)
Monocytes Absolute: 0.9 10*3/uL (ref 0.1–1.0)
Monocytes Relative: 9 %
Neutro Abs: 5.8 10*3/uL (ref 1.7–7.7)
Neutrophils Relative %: 59 %
Platelets: 254 10*3/uL (ref 150–400)
RBC: 4.99 MIL/uL (ref 4.22–5.81)
RDW: 13 % (ref 11.5–15.5)
WBC: 9.8 10*3/uL (ref 4.0–10.5)
nRBC: 0 % (ref 0.0–0.2)

## 2021-08-10 LAB — URINALYSIS, ROUTINE W REFLEX MICROSCOPIC
Bilirubin Urine: NEGATIVE
Glucose, UA: NEGATIVE mg/dL
Hgb urine dipstick: NEGATIVE
Ketones, ur: NEGATIVE mg/dL
Leukocytes,Ua: NEGATIVE
Nitrite: NEGATIVE
Protein, ur: NEGATIVE mg/dL
Specific Gravity, Urine: 1.01 (ref 1.005–1.030)
pH: 6 (ref 5.0–8.0)

## 2021-08-10 LAB — LIPASE, BLOOD: Lipase: 44 U/L (ref 11–51)

## 2021-08-10 MED ORDER — CIPROFLOXACIN HCL 500 MG PO TABS: 500.0000 mg | ORAL_TABLET | Freq: Two times a day (BID) | ORAL | 0 refills | Status: DC

## 2021-08-10 MED ORDER — FENTANYL CITRATE (PF) 100 MCG/2ML IJ SOLN
50.0000 ug | Freq: Once | INTRAMUSCULAR | Status: AC
  Administered 2021-08-10: 50 ug via INTRAVENOUS
  Filled 2021-08-10: qty 2

## 2021-08-10 MED ORDER — METRONIDAZOLE 500 MG/100ML IV SOLN
500.0000 mg | Freq: Once | INTRAVENOUS | Status: AC
  Administered 2021-08-10: 500 mg via INTRAVENOUS
  Filled 2021-08-10: qty 100

## 2021-08-10 MED ORDER — METRONIDAZOLE 500 MG PO TABS: 500.0000 mg | ORAL_TABLET | Freq: Two times a day (BID) | ORAL | 0 refills | Status: DC

## 2021-08-10 MED ORDER — ONDANSETRON HCL 4 MG/2ML IJ SOLN
4.0000 mg | Freq: Once | INTRAMUSCULAR | Status: AC
  Administered 2021-08-10: 4 mg via INTRAVENOUS
  Filled 2021-08-10: qty 2

## 2021-08-10 MED ORDER — IOHEXOL 300 MG/ML  SOLN
100.0000 mL | Freq: Once | INTRAMUSCULAR | Status: AC | PRN
  Administered 2021-08-10: 100 mL via INTRAVENOUS

## 2021-08-10 MED ORDER — SODIUM CHLORIDE 0.9 % IV SOLN
2.0000 g | Freq: Once | INTRAVENOUS | Status: AC
  Administered 2021-08-10: 2 g via INTRAVENOUS
  Filled 2021-08-10: qty 20

## 2021-08-10 MED ORDER — SODIUM CHLORIDE 0.9 % IV SOLN: Freq: Once | INTRAVENOUS | Status: AC

## 2021-08-10 MED ORDER — SODIUM CHLORIDE 0.9 % IV BOLUS
500.0000 mL | Freq: Once | INTRAVENOUS | Status: AC
  Administered 2021-08-10: 500 mL via INTRAVENOUS

## 2021-08-10 NOTE — ED Triage Notes (Signed)
Abd pain 0nset yesterday am, diarrhea,  denies n/v

## 2021-08-10 NOTE — Telephone Encounter (Signed)
Patient currently in ER for severe abdominal pain.   [facility] Night - Client TELEPHONE ADVICE RECORD [employer]  Patient Name:Zachary [name] Gender: Male DOB: 1951-02-06 Age: 70 Y 71 M 9 D Return Phone Number:[phone](Primary),[phone](Secondary) [facility] - Client Client Site [facility] - Night Physician Riki Sheer- MD Contact Type Call Who Is Calling Patient / Member / Family / Caregiver Call Type Triage / Clinical Caller Name Neoma Laming Relationship To Patient Mother Return Phone Number [phone] (Primary) Chief Complaint SEVERE ABDOMINAL PAIN - Severe pain in abdomen Reason for Call Symptomatic / Request for [place] states wants appt for husband for today; think diverticulitis is acting up; severe Abd pain Translation No Nurse Assessment Nurse: Hardin Negus, RN, Mardene Celeste Date/Time (Eastern Time): 08/10/2021 7:36:22 AM Confirm and document reason for call. If symptomatic, describe symptoms. ---His stomach was hurting yesterday, had some diarrhea,. The pain is now severe. T-97.6, O. Slept on and off. Does the patient have any new or worsening symptoms? ---Yes Will a triage be completed? ---Yes Related visit to physician within the last 2 weeks? ---No Does the PT have any chronic conditions? (i.e. diabetes, asthma, this includes High risk factors for pregnancy, etc.) ---Yes List chronic conditions. ---diverticulitis Is this a behavioral health or substance abuse call? ---No Guidelines Guideline Title Affirmed Question Affirmed Notes Nurse Date/Time Eilene Ghazi Time) Abdominal Pain - Male [1] SEVERE pain (e.g., excruciating) AND [2] present > 1 hour Oren Bracket 08/10/2021 7:37:55 AM Disp. Time Eilene Ghazi Time) Disposition Final User 08/10/2021 7:34:28 AM Send to Urgent Suzan Slick, Suanne Marker PLEASE NOTE: All timestamps contained within  this report are represented as [place] Standard Time. CONFIDENTIALTY NOTICE: This fax transmission is intended only for the addressee. It contains information that is legally privileged, confidential or otherwise protected from use or disclosure. If you are not the intended recipient, you are strictly prohibited from reviewing, disclosing, copying using or disseminating any of this information or taking any action in reliance on or regarding this information. If you have received this fax in error, please notify us immediately by telephone so that we can arrange for its return to [place]. Phone: [phone], Toll-Free: [phone], Fax: [phone] Page: 2 of 2 Call Id: [id number] 08/10/2021 7:40:06 AM Go to ED Now Yes Hardin Negus, RN, Lenox Ponds Disagree/Comply Comply Caller Understands Yes PreDisposition Go to ED Care Advice Given Per Guideline GO TO ED NOW: CARE ADVICE given per Abdominal Pain, Male (Adult) guideline. Referrals GO TO FACILITY UNDECIDED

## 2021-08-10 NOTE — ED Provider Notes (Signed)
[facility] Provider Note   CSN: [id number] Arrival date & time: 08/10/21  [id number]     History Chief Complaint  Patient presents with   Abdominal Pain    Zachary [name] is a 70 y.o. male.  He is complaining of lower abdominal pain 8 out of 10 intensity that started yesterday morning.  Associated with 4 episodes of diarrhea.  No fevers chills nausea vomiting.  No urinary symptoms.  History of diverticulitis.  No blood in his stools.  No hematuria.  Has tried nothing for it.  The history is provided by the patient.  Abdominal Pain Pain location:  RLQ, suprapubic and LLQ Pain quality: aching   Pain radiates to:  Does not radiate Pain severity:  Severe Onset quality:  Gradual Duration:  2 days Timing:  Constant Progression:  Worsening Chronicity:  New Context: not sick contacts and not trauma   Relieved by:  Nothing Worsened by:  Nothing Ineffective treatments:  None tried Associated symptoms: diarrhea   Associated symptoms: no chest pain, no chills, no constipation, no cough, no dysuria, no fever, no hematemesis, no hematochezia, no hematuria, no nausea, no shortness of breath, no sore throat and no vomiting       Past Medical History:  Diagnosis Date   Arthritis    Articular cartilage disease    left shoulder   Diverticulitis    DNR (do not resuscitate) 01/16/2021   Dyslipidemia (high LDL; low HDL) 08/15/2016   GERD (gastroesophageal reflux disease)    Hearing problem    hearing deficit   Hemorrhoids    Hypertension    Hypothyroidism    Prostate cancer ([place]) 2015   treated and in remission   Sleep apnea    uses a cpap   Wears glasses    Wears hearing aid    both ears    Patient Active Problem List   Diagnosis Date Noted   Hearing aid worn 02/28/2021   Coronary artery disease involving autologous artery coronary bypass graft with angina pectoris ([place]) 01/22/2021   DNR (do not resuscitate) 01/16/2021   Chest pain 01/15/2021    Prediabetes 05/27/2018   Stomatitis 04/17/2017   Dyslipidemia (high LDL; low HDL) 08/15/2016   S/P arthroscopy of shoulder 05/31/2016   History of prostate cancer 05/09/2016   Complete rotator cuff tear of left shoulder 07/05/2015   Tennis elbow 05/30/2014   Rotator cuff (capsule) sprain 03/10/2014   Right shoulder pain 02/11/2014   Memory difficulty 12/18/2012   Gynecomastia, male 07/30/2012   OSA (obstructive sleep apnea) 04/23/2011   EXTERNAL HEMORRHOIDS 02/12/2011   Prostate cancer ([place]) 11/12/2010   PLANTAR FASCIITIS 04/09/2010   Hypothyroidism 12/08/2009   NEOPLASM OF UNCERTAIN BEHAVIOR OF SKIN 03/10/2009   OSTEOARTHRITIS 02/14/2009   HEARING DEFICIT 02/08/2009   Essential hypertension 02/08/2009   Allergic rhinitis 02/08/2009   GERD 02/08/2009   PROTEINURIA 02/08/2009   DIVERTICULITIS, HX OF 02/08/2009    Past Surgical History:  Procedure Laterality Date   BACK SURGERY  1978   herniated disksurgery   CARDIAC CATHETERIZATION  08/01/2010   COLONOSCOPY     EXAM UNDER ANESTHESIA WITH MANIPULATION OF SHOULDER Right 09/15/2014   Procedure: RIGHT SHOULDER MANIPULATION UNDER ANESTHESIA;  Surgeon: Ninetta Lights, MD;  Location: [facility];  Service: Orthopedics;  Laterality: Right;   HEMORRHOID SURGERY  08/2006   LEFT HEART CATH AND CORONARY ANGIOGRAPHY N/A 01/16/2021   Procedure: LEFT HEART CATH AND CORONARY ANGIOGRAPHY;  Surgeon: Troy Sine,  MD;  Location: [facility];  Service: Cardiovascular;  Laterality: N/A;   SHOULDER ARTHROSCOPY WITH DISTAL CLAVICLE RESECTION Left 07/07/2015   Procedure: SHOULDER ARTHROSCOPY WITH DISTAL CLAVICLE RESECTION;  Surgeon: Kathryne Hitch, MD;  Location: [facility];  Service: Orthopedics;  Laterality: Left;   SHOULDER ARTHROSCOPY WITH ROTATOR CUFF REPAIR Left 12/07/2015   Procedure: LEFT SHOULDER ARTHROSCOPY DEBRIDEMENT, WITH ROTATOR CUFF REPAIR;  Surgeon: Ninetta Lights, MD;  Location: [facility];  Service: Orthopedics;  Laterality: Left;   SHOULDER ARTHROSCOPY WITH ROTATOR CUFF REPAIR AND SUBACROMIAL DECOMPRESSION Left 07/07/2015   Procedure: LEFT SHOULDER SCOPE DEBRIDEMENT, SUBACROMIAL DECOMPRESSION, DISTAL CLAVICULECTOMY, ROTATOR CUFF REPAIR  ;  Surgeon: Kathryne Hitch, MD;  Location: [facility];  Service: Orthopedics;  Laterality: Left;  ANESTHESIA: GENERAL, PRE/POST OP SCALENE   SHOULDER ARTHROSCOPY WITH SUBACROMIAL DECOMPRESSION, ROTATOR CUFF REPAIR AND BICEP TENDON REPAIR Right 03/10/2014   Procedure: RIGHT SHOULDER ARTHROSCOPY WITH SUBACROMIAL DECOMPRESSION, PARTIAL ACROMIOPLASTY WITH CORACOAROMIAL LABRUM DEBRIDEMENT RELEASE DISTAL CLAVICULECTOMY,  ROTATOR CUFF REPAIR AND EXTENSIVE DEBRIDEMENT;  Surgeon: Ninetta Lights, MD;  Location: [facility];  Service: Orthopedics;  Laterality: Right;   TENDON REPAIR  June 06, 2011   right elbow, Dr. Percell Miller       Family History  Problem Relation Age of Onset   Heart disease Father 28   Breast cancer Mother    Cancer Brother    Alcohol abuse Other    Arthritis Other    Cancer Other        Breast, Prostate   Coronary artery disease Other    Irritable bowel syndrome Other    Cystic fibrosis Other    Colon cancer Neg Hx     Social History   Tobacco Use   Smoking status: Never   Smokeless tobacco: Never  Vaping Use   Vaping Use: Never used  Substance Use Topics   Alcohol use: Yes    Comment: occasional   Drug use: No    Home Medications Prior to Admission medications   Medication Sig Start Date End Date Taking? Authorizing Provider  aspirin EC 81 MG EC tablet Take 81 mg by mouth daily.    [provider]  levothyroxine (SYNTHROID) 100 MCG tablet Take 1 tablet by mouth once daily 07/02/21   Nani Ravens, Crosby Oyster, DO  lisinopril (ZESTRIL) 5 MG tablet Take 1 tablet by mouth once daily 07/09/21   Nani Ravens, Crosby Oyster, DO  loratadine (CLARITIN) 10 MG tablet Take 10 mg by mouth  daily.    [provider]  Melatonin 10 MG CAPS Take by mouth.    [provider]  nitroGLYCERIN (NITROSTAT) 0.4 MG SL tablet Place 1 tablet (0.4 mg total) under the tongue every 5 (five) minutes as needed for chest pain. 02/02/21 05/03/21  Tommie Raymond, NP  omeprazole (PRILOSEC) 20 MG capsule Take 1 capsule by mouth once daily 07/23/21   [name], DO  pravastatin (PRAVACHOL) 80 MG tablet Take 1 tablet (80 mg total) by mouth daily at 6 PM. 03/05/21   Wendling, Crosby Oyster, DO  sodium bicarbonate 650 MG tablet Take 1 tablet by mouth once daily 07/23/21   [name], DO    Allergies    Oxycodone, Atorvastatin, and Simvastatin  Review of Systems   Review of Systems  Constitutional:  Negative for chills and fever.  HENT:  Negative for sore throat.   Eyes:  Negative for visual disturbance.  Respiratory:  Negative for  cough and shortness of breath.   Cardiovascular:  Negative for chest pain.  Gastrointestinal:  Positive for abdominal pain and diarrhea. Negative for constipation, hematemesis, hematochezia, nausea and vomiting.  Genitourinary:  Negative for dysuria and hematuria.  Musculoskeletal:  Negative for back pain.  Skin:  Negative for rash.  Neurological:  Negative for headaches.   Physical Exam Updated Vital Signs BP 137/87 (BP Location: Right Arm)   Pulse 70   Temp 98.1 F (36.7 C) (Oral)   Resp 16   Ht '5\' 8"'$  (1.727 m)   Wt 92 kg   SpO2 97%   BMI 30.84 kg/m   Physical Exam Vitals and nursing note reviewed.  Constitutional:      Appearance: Normal appearance. He is well-developed.  HENT:     Head: Normocephalic and atraumatic.  Eyes:     Conjunctiva/sclera: Conjunctivae normal.  Cardiovascular:     Rate and Rhythm: Normal rate and regular rhythm.     Heart sounds: No murmur heard. Pulmonary:     Effort: Pulmonary effort is normal. No respiratory distress.     Breath sounds: Normal breath sounds.  Abdominal:      Palpations: Abdomen is soft.     Tenderness: There is abdominal tenderness in the right lower quadrant and suprapubic area. There is no guarding or rebound.  Musculoskeletal:        General: Normal range of motion.     Cervical back: Neck supple.     Right lower leg: No edema.     Left lower leg: No edema.  Skin:    General: Skin is warm and dry.  Neurological:     General: No focal deficit present.     Mental Status: He is alert.    ED Results / Procedures / Treatments   Labs (all labs ordered are listed, but only abnormal results are displayed) Labs Reviewed  COMPREHENSIVE METABOLIC PANEL - Abnormal; Notable for the following components:      Result Value   Sodium 134 (*)    Glucose, Bld 112 (*)    All other components within normal limits  CBC WITH DIFFERENTIAL/PLATELET  LIPASE, BLOOD  URINALYSIS, ROUTINE W REFLEX MICROSCOPIC    EKG None  Radiology CT Abdomen Pelvis W Contrast  Result Date: 08/10/2021 CLINICAL DATA:  Severe abdominal pain. EXAM: CT ABDOMEN AND PELVIS WITH CONTRAST TECHNIQUE: Multidetector CT imaging of the abdomen and pelvis was performed using the standard protocol following bolus administration of intravenous contrast. CONTRAST:  141m OMNIPAQUE IOHEXOL 300 MG/ML  SOLN COMPARISON:  Abdominal ultrasound dated September 27, 2010. FINDINGS: Lower chest: No acute abnormality. Hepatobiliary: No focal liver abnormality is seen. No gallstones, gallbladder wall thickening, or biliary dilatation. Pancreas: Unremarkable. No pancreatic ductal dilatation or surrounding inflammatory changes. Spleen: Normal in size without focal abnormality. Adrenals/Urinary Tract: Adrenal glands are unremarkable. Bilateral parapelvic renal cysts. No renal calculi or hydronephrosis. The bladder is unremarkable. Stomach/Bowel: Extensive sigmoid colonic diverticulosis. Focal wall thickening of the mid sigmoid colon adjacent to an inflamed diverticulum. No extraluminal air or drainable fluid  collection. The stomach and small bowel are unremarkable. Normal appendix. Vascular/Lymphatic: Aortic atherosclerosis. No enlarged abdominal or pelvic lymph nodes. Reproductive: Prior prostatectomy. Other: Trace free fluid in the pelvis.  No pneumoperitoneum. Musculoskeletal: No acute or significant osseous findings. IMPRESSION: 1. Acute sigmoid diverticulitis. No perforation or abscess. 2. Aortic Atherosclerosis (ICD10-I70.0). Electronically Signed   By: WTitus DubinM.D.   On: 08/10/2021 10:01    Procedures Procedures   Medications Ordered  in ED Medications  sodium chloride 0.9 % bolus 500 mL (has no administration in time range)  ondansetron (ZOFRAN) injection 4 mg (has no administration in time range)  fentaNYL (SUBLIMAZE) injection 50 mcg (has no administration in time range)    ED Course  I have reviewed the triage vital signs and the nursing notes.  Pertinent labs & imaging results that were available during my care of the patient were reviewed by me and considered in my medical decision making (see chart for details).  Clinical Course as of 08/10/21 1805  Fri Aug 10, 2021  1049 Patient's work-up showing he has acute uncomplicated diverticulitis.  He said this is the worst he is ever felt with diverticulitis.  Have ordered an IV dose of antibiotics and pain medicine and will reassess if needs admission. [MB]  1310 Had shared decision-making with patient and his wife regarding admission versus trial of oral antibiotics at home.  He elects to go home.  Return instructions discussed [MB]    Clinical Course User Index [MB] Hayden Rasmussen, MD   MDM Rules/Calculators/A&P                          This patient complains of low abdominal pain; this involves an extensive number of treatment Options and is a complaint that carries with it a high risk of complications and Morbidity. The differential includes diverticulitis, colitis, appendicitis, prostatitis, UTI  I ordered, reviewed  and interpreted labs, which included CBC with normal white count normal hemoglobin, chemistries fairly unremarkable, normal LFTs, urinalysis clear I ordered medication IV pain medicine fluids and IV antibiotics nausea medication I ordered imaging studies which included CT abdomen and pelvis and I independently    visualized and interpreted imaging which showed acute uncomplicated diverticulitis Additional history obtained from patient's wife Previous records obtained and reviewed in epic, he was treated for diverticulitis in March by his PCP.  At that time received Augmentin and that caused him explosive diarrhea after 3 days and was unable to complete course.  After the interventions stated above, I reevaluated the patient and found patient to be symptomatically improved.  He was given the option for admission but he felt he would do better at home.  Return instructions discussed.   Final Clinical Impression(s) / ED Diagnoses Final diagnoses:  Acute diverticulitis    Rx / DC Orders ED Discharge Orders          Ordered    ciprofloxacin (CIPRO) 500 MG tablet  Every 12 hours        08/10/21 1314    metroNIDAZOLE (FLAGYL) 500 MG tablet  2 times daily        08/10/21 1314             Hayden Rasmussen, MD 08/10/21 1807

## 2021-08-10 NOTE — Discharge Instructions (Addendum)
You were seen in the emergency department for lower abdominal pain.  You had lab work urinalysis and a CAT scan of your abdomen and pelvis.  You have acute uncomplicated diverticulitis.  This will often respond to oral antibiotics and bowel rest.  If you experience high fevers or worsening pain you should return to the ER for further evaluation and possible admission.  Otherwise follow-up with your primary care doctor.

## 2021-08-17 ENCOUNTER — Encounter: Payer: Self-pay | Admitting: Family Medicine

## 2021-08-17 ENCOUNTER — Other Ambulatory Visit: Payer: Self-pay

## 2021-08-17 ENCOUNTER — Other Ambulatory Visit: Payer: Self-pay | Admitting: Family Medicine

## 2021-08-17 ENCOUNTER — Ambulatory Visit (INDEPENDENT_AMBULATORY_CARE_PROVIDER_SITE_OTHER): Payer: Medicare Other | Admitting: Family Medicine

## 2021-08-17 VITALS — BP 112/82 | HR 61 | Temp 97.7°F | Ht 68.0 in | Wt 201.5 lb

## 2021-08-17 DIAGNOSIS — C61 Malignant neoplasm of prostate: Secondary | ICD-10-CM

## 2021-08-17 DIAGNOSIS — R7989 Other specified abnormal findings of blood chemistry: Secondary | ICD-10-CM

## 2021-08-17 DIAGNOSIS — K5792 Diverticulitis of intestine, part unspecified, without perforation or abscess without bleeding: Secondary | ICD-10-CM

## 2021-08-17 LAB — COMPREHENSIVE METABOLIC PANEL
ALT: 51 U/L (ref 0–53)
AST: 38 U/L — ABNORMAL HIGH (ref 0–37)
Albumin: 4.3 g/dL (ref 3.5–5.2)
Alkaline Phosphatase: 50 U/L (ref 39–117)
BUN: 20 mg/dL (ref 6–23)
CO2: 27 mEq/L (ref 19–32)
Calcium: 10.1 mg/dL (ref 8.4–10.5)
Chloride: 104 mEq/L (ref 96–112)
Creatinine, Ser: 1.15 mg/dL (ref 0.40–1.50)
GFR: 65.76 mL/min (ref >60.00)
Glucose, Bld: 89 mg/dL (ref 70–99)
Potassium: 4.4 mEq/L (ref 3.5–5.1)
Sodium: 140 mEq/L (ref 135–145)
Total Bilirubin: 0.4 mg/dL (ref 0.2–1.2)
Total Protein: 6.8 g/dL (ref 6.0–8.3)

## 2021-08-17 LAB — PSA: PSA: 0 ng/mL — ABNORMAL LOW (ref 0.10–4.00)

## 2021-08-17 NOTE — Progress Notes (Signed)
Chief Complaint  Patient presents with   Follow-up    ER Follow-up     Subjective: Patient is a 70 y.o. male here for ED f/u. Here w spouse.   Patient went to the emergency department on 8/19 was found to have acute diverticulitis.  This is his second episode this year.  This episode was more severe.Marland Kitchen  He was started on ciprofloxacin and metronidazole.  His pain is improved but he still having some cramping.  He had diarrhea this morning.  Otherwise bowel movements have been normal.  No nausea or vomiting.  Diet is advancing.  He has a colonoscopy due this November.  Past Medical History:  Diagnosis Date   Arthritis    Articular cartilage disease    left shoulder   Diverticulitis    Dyslipidemia (high LDL; low HDL) 08/15/2016   GERD (gastroesophageal reflux disease)    Hearing problem    hearing deficit   Hemorrhoids    Hypertension    Hypothyroidism    Prostate cancer ([place]) 2015   treated and in remission   Sleep apnea    uses a cpap   Wears glasses    Wears hearing aid    both ears    Objective: BP 112/82   Pulse 61   Temp 97.7 F (36.5 C) (Oral)   Ht '5\' 8"'$  (1.727 m)   Wt 201 lb 8 oz (91.4 kg)   SpO2 95%   BMI 30.64 kg/m  General: Awake, appears stated age Heart: RRR Abd: BS+, mildly distended, mild ttp in lower quadrants Lungs: CTAB, no rales, wheezes or rhonchi. No accessory muscle use Psych: Age appropriate judgment and insight, normal affect and mood  Assessment and Plan: Diverticulitis - Plan: Comprehensive metabolic panel  Prostate cancer [facility]) - Plan: PSA, Ambulatory referral to Urology  Finish Cipro and Flagyl. Clinically improving. Discussed recurrence risk. Offered referral to gen surg for opinion, politely declined at this time. They will inform me if they change their minds. Needs new urologist. Prefers not [facility] due to poor experience. Will place referral. PSA due.  The patient and his spouse voiced understanding and agreement to the  plan.  I spent 32 min w the patient/spouse discussing the above plan and reviewing his chart on the same day of visit.   [place], DO 08/17/21  7:41 AM

## 2021-08-17 NOTE — Patient Instructions (Addendum)
Advance diet as tolerated.  Finish the antibiotics.  You have a 1/3 chance of this coming back. I think it is very reasonable to wait and see. If you change your mind and want to see a general surgeon for their opinion, please let me know.  If we need a referral to the gastroenterology team for his follow up colonoscopy, just let me know.   Let us know if you need anything.

## 2021-08-24 ENCOUNTER — Ambulatory Visit: Payer: Medicare Other | Admitting: Family Medicine

## 2021-08-28 ENCOUNTER — Other Ambulatory Visit (HOSPITAL_BASED_OUTPATIENT_CLINIC_OR_DEPARTMENT_OTHER): Payer: Self-pay

## 2021-08-29 ENCOUNTER — Ambulatory Visit (INDEPENDENT_AMBULATORY_CARE_PROVIDER_SITE_OTHER): Payer: Medicare Other

## 2021-08-29 VITALS — Ht 68.0 in | Wt 201.0 lb

## 2021-08-29 DIAGNOSIS — Z0000 Encounter for general adult medical examination without abnormal findings: Secondary | ICD-10-CM

## 2021-08-29 NOTE — Patient Instructions (Signed)
[name] , Thank you for taking time to complete your Medicare Wellness Visit. I appreciate your ongoing commitment to your health goals. Please review the following plan we discussed and let me know if I can assist you in the future.   Screening recommendations/referrals: Colonoscopy: Completed 11/06/2016-Due 11/06/2021 Recommended yearly ophthalmology/optometry visit for glaucoma screening and checkup Recommended yearly dental visit for hygiene and checkup  Vaccinations: Influenza vaccine: Due-May obtain vaccine at our office or your local pharmacy Pneumococcal vaccine: Up to date Tdap vaccine: Up to date-Due 01/07/2023 Shingles vaccine: Discuss with pharmacy   Covid-19: Up to date  Advanced directives: Please bring a copy for your chart  Conditions/risks identified: See problem list  Next appointment: Follow up in one year for your annual wellness visit. 09/02/2022 @ 8:20  Preventive Care 65 Years and Older, Male Preventive care refers to lifestyle choices and visits with your health care provider that can promote health and wellness. What does preventive care include? A yearly physical exam. This is also called an annual well check. Dental exams once or twice a year. Routine eye exams. Ask your health care provider how often you should have your eyes checked. Personal lifestyle choices, including: Daily care of your teeth and gums. Regular physical activity. Eating a healthy diet. Avoiding tobacco and drug use. Limiting alcohol use. Practicing safe sex. Taking low doses of aspirin every day. Taking vitamin and mineral supplements as recommended by your health care provider. What happens during an annual well check? The services and screenings done by your health care provider during your annual well check will depend on your age, overall health, lifestyle risk factors, and family history of disease. Counseling  Your health care provider may ask you questions about  your: Alcohol use. Tobacco use. Drug use. Emotional well-being. Home and relationship well-being. Sexual activity. Eating habits. History of falls. Memory and ability to understand (cognition). Work and work Statistician. Screening  You may have the following tests or measurements: Height, weight, and BMI. Blood pressure. Lipid and cholesterol levels. These may be checked every 5 years, or more frequently if you are over 55 years old. Skin check. Lung cancer screening. You may have this screening every year starting at age 59 if you have a 30-pack-year history of smoking and currently smoke or have quit within the past 15 years. Fecal occult blood test (FOBT) of the stool. You may have this test every year starting at age 56. Flexible sigmoidoscopy or colonoscopy. You may have a sigmoidoscopy every 5 years or a colonoscopy every 10 years starting at age 41. Prostate cancer screening. Recommendations will vary depending on your family history and other risks. Hepatitis C blood test. Hepatitis B blood test. Sexually transmitted disease (STD) testing. Diabetes screening. This is done by checking your blood sugar (glucose) after you have not eaten for a while (fasting). You may have this done every 1-3 years. Abdominal aortic aneurysm (AAA) screening. You may need this if you are a current or former smoker. Osteoporosis. You may be screened starting at age 59 if you are at high risk. Talk with your health care provider about your test results, treatment options, and if necessary, the need for more tests. Vaccines  Your health care provider may recommend certain vaccines, such as: Influenza vaccine. This is recommended every year. Tetanus, diphtheria, and acellular pertussis (Tdap, Td) vaccine. You may need a Td booster every 10 years. Zoster vaccine. You may need this after age 58. Pneumococcal 13-valent conjugate (PCV13) vaccine. One  dose is recommended after age 22. Pneumococcal  polysaccharide (PPSV23) vaccine. One dose is recommended after age 42. Talk to your health care provider about which screenings and vaccines you need and how often you need them. This information is not intended to replace advice given to you by your health care provider. Make sure you discuss any questions you have with your health care provider. Document Released: 01/05/2016 Document Revised: 08/28/2016 Document Reviewed: 10/10/2015 Elsevier Interactive Patient Education  2017 [employer] Prevention in the Home Falls can cause injuries. They can happen to people of all ages. There are many things you can do to make your home safe and to help prevent falls. What can I do on the outside of my home? Regularly fix the edges of walkways and driveways and fix any cracks. Remove anything that might make you trip as you walk through a door, such as a raised step or threshold. Trim any bushes or trees on the path to your home. Use bright outdoor lighting. Clear any walking paths of anything that might make someone trip, such as rocks or tools. Regularly check to see if handrails are loose or broken. Make sure that both sides of any steps have handrails. Any raised decks and porches should have guardrails on the edges. Have any leaves, snow, or ice cleared regularly. Use sand or salt on walking paths during winter. Clean up any spills in your garage right away. This includes oil or grease spills. What can I do in the bathroom? Use night lights. Install grab bars by the toilet and in the tub and shower. Do not use towel bars as grab bars. Use non-skid mats or decals in the tub or shower. If you need to sit down in the shower, use a plastic, non-slip stool. Keep the floor dry. Clean up any water that spills on the floor as soon as it happens. Remove soap buildup in the tub or shower regularly. Attach bath mats securely with double-sided non-slip rug tape. Do not have throw rugs and other  things on the floor that can make you trip. What can I do in the bedroom? Use night lights. Make sure that you have a light by your bed that is easy to reach. Do not use any sheets or blankets that are too big for your bed. They should not hang down onto the floor. Have a firm chair that has side arms. You can use this for support while you get dressed. Do not have throw rugs and other things on the floor that can make you trip. What can I do in the kitchen? Clean up any spills right away. Avoid walking on wet floors. Keep items that you use a lot in easy-to-reach places. If you need to reach something above you, use a strong step stool that has a grab bar. Keep electrical cords out of the way. Do not use floor polish or wax that makes floors slippery. If you must use wax, use non-skid floor wax. Do not have throw rugs and other things on the floor that can make you trip. What can I do with my stairs? Do not leave any items on the stairs. Make sure that there are handrails on both sides of the stairs and use them. Fix handrails that are broken or loose. Make sure that handrails are as long as the stairways. Check any carpeting to make sure that it is firmly attached to the stairs. Fix any carpet that is loose or worn.  Avoid having throw rugs at the top or bottom of the stairs. If you do have throw rugs, attach them to the floor with carpet tape. Make sure that you have a light switch at the top of the stairs and the bottom of the stairs. If you do not have them, ask someone to add them for you. What else can I do to help prevent falls? Wear shoes that: Do not have high heels. Have rubber bottoms. Are comfortable and fit you well. Are closed at the toe. Do not wear sandals. If you use a stepladder: Make sure that it is fully opened. Do not climb a closed stepladder. Make sure that both sides of the stepladder are locked into place. Ask someone to hold it for you, if possible. Clearly  mark and make sure that you can see: Any grab bars or handrails. First and last steps. Where the edge of each step is. Use tools that help you move around (mobility aids) if they are needed. These include: Canes. Walkers. Scooters. Crutches. Turn on the lights when you go into a dark area. Replace any light bulbs as soon as they burn out. Set up your furniture so you have a clear path. Avoid moving your furniture around. If any of your floors are uneven, fix them. If there are any pets around you, be aware of where they are. Review your medicines with your doctor. Some medicines can make you feel dizzy. This can increase your chance of falling. Ask your doctor what other things that you can do to help prevent falls. This information is not intended to replace advice given to you by your health care provider. Make sure you discuss any questions you have with your health care provider. Document Released: 10/05/2009 Document Revised: 05/16/2016 Document Reviewed: 01/13/2015 Elsevier Interactive Patient Education  2017 [employer].

## 2021-08-29 NOTE — Progress Notes (Signed)
Subjective:   Zachary [name] is a 70 y.o. male who presents for an Initial Medicare Annual Wellness Visit.  I connected with Martinez today by telephone and verified that I am speaking with the correct person using two identifiers. Location patient: home Location provider: work Persons participating in the virtual visit: patient, Marine scientist.    I discussed the limitations, risks, security and privacy concerns of performing an evaluation and management service by telephone and the availability of in person appointments. I also discussed with the patient that there may be a patient responsible charge related to this service. The patient expressed understanding and verbally consented to this telephonic visit.    Interactive audio and video telecommunications were attempted between this provider and patient, however failed, due to patient having technical difficulties OR patient did not have access to video capability.  We continued and completed visit with audio only.  Some vital signs may be absent or patient reported.   Time Spent with patient on telephone encounter: 20 minutes   Review of Systems     Cardiac Risk Factors include: advanced age (>63mn, >>4women);dyslipidemia;hypertension;obesity (BMI >30kg/m2);male gender     Objective:    Today's Vitals   08/29/21 0741  Weight: 201 lb (91.2 kg)  Height: '5\' 8"'$  (1.727 m)   Body mass index is 30.56 kg/m.  Advanced Directives 08/29/2021 08/10/2021 01/16/2021 01/15/2021 12/07/2020 09/01/2018 12/30/2016  Does Patient Have a Medical Advance Directive? Yes No No Yes No Yes Yes  Type of [place]amed[place]of [place] will - - Living will;Healthcare Power of [place] will  Does patient want to make changes to medical advance directive? - - - - - No - Patient declined No - Patient declined  Copy of [place] Chart? No - copy  requested - - - - No - copy requested No - copy requested  Would patient like information on creating a medical advance directive? - No - Patient declined Yes (Inpatient - patient defers creating a medical advance directive at this time - Information given) - No - Patient declined - -    Current Medications (verified) Outpatient Encounter Medications as of 08/29/2021  Medication Sig   aspirin EC 81 MG EC tablet Take 81 mg by mouth daily.   levothyroxine (SYNTHROID) 100 MCG tablet Take 1 tablet by mouth once daily   lisinopril (ZESTRIL) 5 MG tablet Take 1 tablet by mouth once daily   loratadine (CLARITIN) 10 MG tablet Take 10 mg by mouth daily.   Melatonin 10 MG CAPS Take by mouth.   omeprazole (PRILOSEC) 20 MG capsule Take 1 capsule by mouth once daily   pravastatin (PRAVACHOL) 80 MG tablet Take 1 tablet (80 mg total) by mouth daily at 6 PM.   sodium bicarbonate 650 MG tablet Take 1 tablet by mouth once daily   nitroGLYCERIN (NITROSTAT) 0.4 MG SL tablet Place 1 tablet (0.4 mg total) under the tongue every 5 (five) minutes as needed for chest pain.   [DISCONTINUED] ciprofloxacin (CIPRO) 500 MG tablet Take 1 tablet (500 mg total) by mouth every 12 (twelve) hours.   [DISCONTINUED] metroNIDAZOLE (FLAGYL) 500 MG tablet Take 1 tablet (500 mg total) by mouth 2 (two) times daily.   No facility-administered encounter medications on file as of 08/29/2021.    Allergies (verified) Oxycodone, Atorvastatin, and Simvastatin   History: Past Medical History:  Diagnosis Date   Arthritis    Articular  cartilage disease    left shoulder   Diverticulitis    Dyslipidemia (high LDL; low HDL) 08/15/2016   GERD (gastroesophageal reflux disease)    Hearing problem    hearing deficit   Hemorrhoids    Hypertension    Hypothyroidism    Prostate cancer ([place]) 2015   treated and in remission   Sleep apnea    uses a cpap   Wears glasses    Wears hearing aid    both ears   Past Surgical History:  Procedure  Laterality Date   BACK SURGERY  1978   herniated disksurgery   CARDIAC CATHETERIZATION  08/01/2010   COLONOSCOPY     EXAM UNDER ANESTHESIA WITH MANIPULATION OF SHOULDER Right 09/15/2014   Procedure: RIGHT SHOULDER MANIPULATION UNDER ANESTHESIA;  Surgeon: Ninetta Lights, MD;  Location: [facility];  Service: Orthopedics;  Laterality: Right;   HEMORRHOID SURGERY  08/2006   LEFT HEART CATH AND CORONARY ANGIOGRAPHY N/A 01/16/2021   Procedure: LEFT HEART CATH AND CORONARY ANGIOGRAPHY;  Surgeon: Troy Sine, MD;  Location: [facility];  Service: Cardiovascular;  Laterality: N/A;   SHOULDER ARTHROSCOPY WITH DISTAL CLAVICLE RESECTION Left 07/07/2015   Procedure: SHOULDER ARTHROSCOPY WITH DISTAL CLAVICLE RESECTION;  Surgeon: Kathryne Hitch, MD;  Location: [facility];  Service: Orthopedics;  Laterality: Left;   SHOULDER ARTHROSCOPY WITH ROTATOR CUFF REPAIR Left 12/07/2015   Procedure: LEFT SHOULDER ARTHROSCOPY DEBRIDEMENT, WITH ROTATOR CUFF REPAIR;  Surgeon: Ninetta Lights, MD;  Location: [facility];  Service: Orthopedics;  Laterality: Left;   SHOULDER ARTHROSCOPY WITH ROTATOR CUFF REPAIR AND SUBACROMIAL DECOMPRESSION Left 07/07/2015   Procedure: LEFT SHOULDER SCOPE DEBRIDEMENT, SUBACROMIAL DECOMPRESSION, DISTAL CLAVICULECTOMY, ROTATOR CUFF REPAIR  ;  Surgeon: Kathryne Hitch, MD;  Location: [facility];  Service: Orthopedics;  Laterality: Left;  ANESTHESIA: GENERAL, PRE/POST OP SCALENE   SHOULDER ARTHROSCOPY WITH SUBACROMIAL DECOMPRESSION, ROTATOR CUFF REPAIR AND BICEP TENDON REPAIR Right 03/10/2014   Procedure: RIGHT SHOULDER ARTHROSCOPY WITH SUBACROMIAL DECOMPRESSION, PARTIAL ACROMIOPLASTY WITH CORACOAROMIAL LABRUM DEBRIDEMENT RELEASE DISTAL CLAVICULECTOMY,  ROTATOR CUFF REPAIR AND EXTENSIVE DEBRIDEMENT;  Surgeon: Ninetta Lights, MD;  Location: [facility];  Service: Orthopedics;  Laterality: Right;   TENDON REPAIR  June 06, 2011    right elbow, Dr. Percell Miller   Family History  Problem Relation Age of Onset   Heart disease Father 56   Breast cancer Mother    Cancer Brother    Alcohol abuse Other    Arthritis Other    Cancer Other        Breast, Prostate   Coronary artery disease Other    Irritable bowel syndrome Other    Cystic fibrosis Other    Colon cancer Neg Hx    Social History   Socioeconomic History   Marital status: Married    Spouse name: Neoma Laming   Number of children: 2   Years of education: Not on file   Highest education level: Not on file  Occupational History   Occupation: retired Presenter, broadcasting: [employer]    Comment: Designer, multimedia  Tobacco Use   Smoking status: Never   Smokeless tobacco: Never  Vaping Use   Vaping Use: Never used  Substance and Sexual Activity   Alcohol use: Yes    Comment: occasional   Drug use: No   Sexual activity: Not on file  Other Topics Concern   Not on file  Social History  Narrative   Previously Pension scheme manager   Social Determinants of Radio broadcast assistant Strain: Low Risk    Difficulty of Paying Living Expenses: Not hard at all  Food Insecurity: No Food Insecurity   Worried About Charity fundraiser in the Last Year: Never true   Arboriculturist in the Last Year: Never true  Transportation Needs: No Transportation Needs   Lack of Transportation (Medical): No   Lack of Transportation (Non-Medical): No  Physical Activity: Sufficiently Active   Days of Exercise per Week: 7 days   Minutes of Exercise per Session: 30 min  Stress: No Stress Concern Present   Feeling of Stress : Not at all  Social Connections: Moderately Isolated   Frequency of Communication with Friends and Family: More than three times a week   Frequency of Social Gatherings with Friends and Family: More than three times a week   Attends Religious Services: Never   Marine scientist or Organizations: No    Attends Music therapist: Never   Marital Status: Married    Tobacco Counseling Counseling given: Not Answered   Clinical Intake:  Pre-visit preparation completed: Yes  Pain : No/denies pain     BMI - recorded: 30.56 Nutritional Status: BMI > 30  Obese Nutritional Risks: None Diabetes: No  How often do you need to have someone help you when you read instructions, pamphlets, or other written materials from your doctor or pharmacy?: 1 - Never  Diabetic?No  Interpreter Needed?: No  Information entered by :: Caroleen Hamman LPN   Activities of Daily Living In your present state of health, do you have any difficulty performing the following activities: 08/29/2021 01/16/2021  Hearing? N -  Vision? N -  Difficulty concentrating or making decisions? N -  Walking or climbing stairs? N -  Comment - -  Dressing or bathing? N -  Doing errands, shopping? N N  Preparing Food and eating ? N -  Using the Toilet? N -  In the past six months, have you accidently leaked urine? Y -  Comment occasionally -  Do you have problems with loss of bowel control? N -  Managing your Medications? N -  Managing your Finances? N -  Housekeeping or managing your Housekeeping? N -  Some recent data might be hidden    Patient Care Team: [name], DO as PCP - General (Family Medicine) Sueanne Margarita, MD as PCP - Cardiology (Cardiology) Elizebeth Koller Mora Appl, MD (Inactive) (Internal Medicine) Franchot Gallo, MD as Consulting Physician (Urology)  Indicate any recent Medical Services you may have received from other than [facility] providers in the past year (date may be approximate).     Assessment:   This is a routine wellness examination for Alejandro.  Hearing/Vision screen Hearing Screening - Comments:: Bilateral hearing aids Vision Screening - Comments:: Last eye exam-05/2021-Dr. Lucius Conn  Dietary issues and exercise activities discussed: Current Exercise  Habits: Home exercise routine, Type of exercise: walking, Time (Minutes): 30, Frequency (Times/Week): 7, Weekly Exercise (Minutes/Week): 210, Intensity: Mild, Exercise limited by: None identified   Goals Addressed             This Visit's Progress    Patient Stated       Drink more water & continue walking       Depression Screen PHQ 2/9 Scores 08/29/2021 08/17/2021 09/27/2020 01/26/2014  PHQ - 2 Score 0 0 0 0  Fall Risk Fall Risk  08/29/2021 01/22/2021 01/26/2014  Falls in the past year? 0 0 No  Number falls in past yr: 0 0 -  Injury with Fall? 0 0 -  Follow up Falls prevention discussed - -    FALL RISK PREVENTION PERTAINING TO THE HOME:  Any stairs in or around the home? Yes  If so, are there any without handrails? No  Home free of loose throw rugs in walkways, pet beds, electrical cords, etc? Yes  Adequate lighting in your home to reduce risk of falls? Yes   ASSISTIVE DEVICES UTILIZED TO PREVENT FALLS:  Life alert? No  Use of a cane, walker or w/c? No  Grab bars in the bathroom? Yes  Shower chair or bench in shower? No  Elevated toilet seat or a handicapped toilet? No   TIMED UP AND GO:  Was the test performed? No -phone visit.    Cognitive Function:Normal cognitive status assessed by this Nurse Health Advisor. No abnormalities found.     Montreal Cognitive Assessment  11/01/2020  Visuospatial/ Executive (0/5) 2  Naming (0/3) 3  Attention: Read list of digits (0/2) 1  Attention: Read list of letters (0/1) 1  Attention: Serial 7 subtraction starting at 100 (0/3) 3  Language: Repeat phrase (0/2) 0  Language : Fluency (0/1) 0  Abstraction (0/2) 0  Delayed Recall (0/5) 3  Orientation (0/6) 6  Total 19  Adjusted Score (based on education) 20      Immunizations Immunization History  Administered Date(s) Administered   Fluad Quad(high Dose 65+) 09/27/2020   Influenza Whole 11/06/2010, 10/24/2011, 09/22/2012   Influenza,inj,Quad PF,6+ Mos 09/07/2019    Influenza-Unspecified 09/22/2014, 09/23/2015, 09/17/2016, 09/09/2017   PFIZER Comirnaty(Gray Top)Covid-19 Tri-Sucrose Vaccine 04/05/2021   PFIZER(Purple Top)SARS-COV-2 Vaccination 01/14/2020, 02/03/2020, 09/15/2020   Pneumococcal Polysaccharide-23 01/14/2019, 04/14/2020   Td 10/17/2008   Tdap 01/07/2013   Zoster, Live 08/08/2015    TDAP status: Up to date  Flu Vaccine status: Due, Education has been provided regarding the importance of this vaccine. Advised may receive this vaccine at local pharmacy or Health Dept. Aware to provide a copy of the vaccination record if obtained from local pharmacy or Health Dept. Verbalized acceptance and understanding.  Pneumococcal vaccine status: Up to date  Covid-19 vaccine status: Completed vaccines  Qualifies for Shingles Vaccine? Yes   Zostavax completed Yes   Shingrix Completed?: No.    Education has been provided regarding the importance of this vaccine. Patient has been advised to call insurance company to determine out of pocket expense if they have not yet received this vaccine. Advised may also receive vaccine at local pharmacy or Health Dept. Verbalized acceptance and understanding.  Screening Tests Health Maintenance  Topic Date Due   Zoster Vaccines- Shingrix (1 of 2) Never done   INFLUENZA VACCINE  07/23/2021   COVID-19 Vaccine (5 - Booster for [place] series) 08/05/2021   COLONOSCOPY (Pts 45-23yr Insurance coverage will need to be confirmed)  11/06/2021   TETANUS/TDAP  01/07/2023   Hepatitis C Screening  Completed   HPV VACCINES  Aged Out   PNA vac Low Risk Adult  Discontinued    Health Maintenance  Health Maintenance Due  Topic Date Due   Zoster Vaccines- Shingrix (1 of 2) Never done   INFLUENZA VACCINE  07/23/2021   COVID-19 Vaccine (5 - Booster for [place]) 08/05/2021    Colorectal cancer screening: Type of screening: Colonoscopy. Completed 11/06/2016. Repeat every 5-10 years  Lung Cancer Screening: (Low Dose CT  Chest recommended if Age 63-80 years, 30 pack-year currently smoking OR have quit w/in 15years.) does not qualify.     Additional Screening:  Hepatitis C Screening: Completed 08/01/2015  Vision Screening: Recommended annual ophthalmology exams for early detection of glaucoma and other disorders of the eye. Is the patient up to date with their annual eye exam?  Yes  Who is the provider or what is the name of the office in which the patient attends annual eye exams? Dr. Burna Sis  Dental Screening: Recommended annual dental exams for proper oral hygiene  Community Resource Referral / Chronic Care Management: CRR required this visit?  No   CCM required this visit?  No      Plan:     I have personally reviewed and noted the following in the patient's chart:   Medical and social history Use of alcohol, tobacco or illicit drugs  Current medications and supplements including opioid prescriptions. Patient is not currently taking opioid prescriptions. Functional ability and status Nutritional status Physical activity Advanced directives List of other physicians Hospitalizations, surgeries, and ER visits in previous 12 months Vitals Screenings to include cognitive, depression, and falls Referrals and appointments  In addition, I have reviewed and discussed with patient certain preventive protocols, quality metrics, and best practice recommendations. A written personalized care plan for preventive services as well as general preventive health recommendations were provided to patient.   Due to this being a telephonic visit, the after visit summary with patients personalized plan was offered to patient via mail or my-chart. Patient would like to access on my-chart.   Marta Antu, LPN   [id number]  Nurse Health Advisor  Nurse Notes: None

## 2021-09-07 ENCOUNTER — Ambulatory Visit (INDEPENDENT_AMBULATORY_CARE_PROVIDER_SITE_OTHER): Payer: Medicare Other | Admitting: Family Medicine

## 2021-09-07 ENCOUNTER — Encounter: Payer: Self-pay | Admitting: Family Medicine

## 2021-09-07 ENCOUNTER — Other Ambulatory Visit: Payer: Self-pay

## 2021-09-07 VITALS — BP 120/78 | HR 65 | Temp 98.0°F | Ht 68.0 in | Wt 202.5 lb

## 2021-09-07 DIAGNOSIS — L600 Ingrowing nail: Secondary | ICD-10-CM | POA: Diagnosis not present

## 2021-09-07 MED ORDER — TRAMADOL HCL 50 MG PO TABS: 50.0000 mg | ORAL_TABLET | Freq: Four times a day (QID) | ORAL | 0 refills | Status: DC | PRN

## 2021-09-07 NOTE — Progress Notes (Addendum)
Chief Complaint  Patient presents with   Foot Pain    left    Zachary [name] is a 70 y.o. male here for a skin complaint.  Duration: 2 weeks Location: L great toe Pruritic? No Painful? Yes Drainage? No No inj or new topical.  Other associated symptoms: swelling Therapies tried thus far: none  Past Medical History:  Diagnosis Date   Arthritis    Articular cartilage disease    left shoulder   Diverticulitis    Dyslipidemia (high LDL; low HDL) 08/15/2016   GERD (gastroesophageal reflux disease)    Hearing problem    hearing deficit   Hemorrhoids    Hypertension    Hypothyroidism    Prostate cancer ([place]) 2015   treated and in remission   Sleep apnea    uses a cpap   Wears glasses    Wears hearing aid    both ears    BP 120/78   Pulse 65   Temp 98 F (36.7 C) (Oral)   Ht '5\' 8"'$  (1.727 m)   Wt 202 lb 8 oz (91.9 kg)   SpO2 99%   BMI 30.79 kg/m  Gen: awake, alert, appearing stated age Lungs: No accessory muscle use Skin: Soft tissue swelling noted over medial L great toenail border. +TTP over toenail. No drainage, erythema, fluctuance, excoriation Psych: Age appropriate judgment and insight  Procedure note; partial toenail removal Informed consent obtained. The area was anesthetized with a four corner ring block using 1.5 mL of 2% lidocaine without epinephrine in each quadrant, a total of 6 mL. A tourniquet was placed around the base of the toe. Several minutes past while the initial anesthesia started to work. An straight hemostat was used to both elevate the nail and scissors were used to cut the nail. A straight hemostat was then use to wrest the nail from place. Phenol was used at the base of the nail x 2 for 1 min each.  Adequate hemostasis was obtained after the tourniquet was removed. The area was then dressed.  The patient tolerated the procedure well. There were no complications noted.  Ingrown left big toenail - Plan: traMADol (ULTRAM) 50 MG  tablet, DISCONTINUED: traMADol (ULTRAM) 50 MG tablet  Aftercare instructions verbalized and written down. Reck toe in 1 week. Betadine/iodine soaks strongly encouraged. Go back to using arch supports. Warnings about tramadol verbalized and written down. Ice. Tylenol.  The patient voiced understanding and agreement to the plan.  [place], DO 09/07/21 8:39 AM

## 2021-09-07 NOTE — Patient Instructions (Addendum)
When you do wash it, use only soap and water. Do not vigorously scrub. Keep the area clean and dry.   Things to look out for: increasing pain not relieved by ibuprofen/acetaminophen, fevers, spreading redness, drainage of pus, or foul odor.  Betadine or iodine soaks twice daily for 10-15 minutes at a time. This is very important!  OK to take Tylenol 1000 mg (2 extra strength tabs) or 975 mg (3 regular strength tabs) every 6 hours as needed.  Do not drink alcohol, do any illicit/street drugs, drive or do anything that requires alertness while on the tramadol (you have been on this before).   Ice/cold pack over area for 10-15 min twice daily.  Let us know if you need anything.

## 2021-09-07 NOTE — Addendum Note (Signed)
Addended by: Ames Coupe on: 09/07/2021 08:40 AM   Modules accepted: Orders

## 2021-09-08 ENCOUNTER — Other Ambulatory Visit: Payer: Self-pay | Admitting: Family Medicine

## 2021-09-13 ENCOUNTER — Ambulatory Visit: Payer: Medicare Other | Attending: Internal Medicine

## 2021-09-13 DIAGNOSIS — Z23 Encounter for immunization: Secondary | ICD-10-CM

## 2021-09-13 NOTE — Progress Notes (Signed)
   Covid-19 Vaccination Clinic  Name:  Zachary [name]    MRN: [id number] DOB: 1951-05-03  09/13/2021  [name] was observed post Covid-19 immunization for 15 minutes without incident. He was provided with Vaccine Information Sheet and instruction to access the V-Safe system.   [name] was instructed to call 911 with any severe reactions post vaccine: Difficulty breathing  Swelling of face and throat  A fast heartbeat  A bad rash all over body  Dizziness and weakness

## 2021-09-14 ENCOUNTER — Other Ambulatory Visit: Payer: Medicare Other

## 2021-09-14 ENCOUNTER — Ambulatory Visit (INDEPENDENT_AMBULATORY_CARE_PROVIDER_SITE_OTHER): Payer: Medicare Other | Admitting: Family Medicine

## 2021-09-14 ENCOUNTER — Other Ambulatory Visit: Payer: Self-pay

## 2021-09-14 ENCOUNTER — Encounter: Payer: Self-pay | Admitting: Family Medicine

## 2021-09-14 VITALS — BP 132/80 | HR 73 | Temp 98.0°F | Ht 68.0 in | Wt 203.0 lb

## 2021-09-14 DIAGNOSIS — Z5189 Encounter for other specified aftercare: Secondary | ICD-10-CM | POA: Diagnosis not present

## 2021-09-14 DIAGNOSIS — K5732 Diverticulitis of large intestine without perforation or abscess without bleeding: Secondary | ICD-10-CM

## 2021-09-14 DIAGNOSIS — R7401 Elevation of levels of liver transaminase levels: Secondary | ICD-10-CM

## 2021-09-14 LAB — HEPATIC FUNCTION PANEL
ALT: 23 U/L (ref 0–53)
AST: 18 U/L (ref 0–37)
Albumin: 4.3 g/dL (ref 3.5–5.2)
Alkaline Phosphatase: 54 U/L (ref 39–117)
Bilirubin, Direct: 0.1 mg/dL (ref 0.0–0.3)
Total Bilirubin: 0.5 mg/dL (ref 0.2–1.2)
Total Protein: 6.6 g/dL (ref 6.0–8.3)

## 2021-09-14 MED ORDER — AMOXICILLIN-POT CLAVULANATE 875-125 MG PO TABS
1.0000 | ORAL_TABLET | Freq: Two times a day (BID) | ORAL | 0 refills | Status: DC
Start: 2021-09-14 — End: 2021-11-04

## 2021-09-14 NOTE — Progress Notes (Signed)
Chief Complaint  Patient presents with   Follow-up    Subjective: Patient is a 70 y.o. male here for f/u.  Patient is a partial toenail removal on the left great toe 1 week ago.  He is doing well and pain-free.  He is still draining slight amounts of clear fluid.  He is compliant with the soaks.  No fevers or purulent drainage.  No spreading redness.  The patient never fully recovered from his bout with sigmoid diverticulitis from 1 month ago.  His left lower quadrant abdominal pain started to worsen recently.  Stools are loose but no bleeding.  He denies any fevers, nausea, or vomiting.  He is able to eat and drink without issue.  He did complete a 7-day course of Flagyl and ciprofloxacin.  Past Medical History:  Diagnosis Date   Arthritis    Articular cartilage disease    left shoulder   Diverticulitis    Dyslipidemia (high LDL; low HDL) 08/15/2016   GERD (gastroesophageal reflux disease)    Hearing problem    hearing deficit   Hemorrhoids    Hypertension    Hypothyroidism    Prostate cancer ([place]) 2015   treated and in remission   Sleep apnea    uses a cpap   Wears glasses    Wears hearing aid    both ears    Objective: BP 132/80   Pulse 73   Temp 98 F (36.7 C) (Oral)   Ht 5\' 8"  (1.727 m)   Wt 203 lb (92.1 kg)   SpO2 99%   BMI 30.87 kg/m  General: Awake, appears stated age Heart: RRR, no murmurs Lungs: CTAB, no rales, wheezes or rhonchi. No accessory muscle use Abd: BS+, S, TTP in LLQ, ND Psych: Age appropriate judgment and insight, normal affect and mood  Assessment and Plan: Sigmoid diverticulitis - Plan: amoxicillin-clavulanate (AUGMENTIN) 875-125 MG tablet  Visit for wound check  Elevated ALT measurement - Plan: Hepatic function panel  Will start 10 d course of bid Augmentin. Will think about referring if this happens again.  Nail looks great. Keep soaking for the next few days. Reck.  The patient voiced understanding and agreement to the  plan.  [place], DO 09/14/21  9:08 AM

## 2021-09-14 NOTE — Patient Instructions (Addendum)
I would keep soaking for the next few days. This looks great.  I will be in touch with you if I want to send you to a specialist.  Let us know if you need anything.

## 2021-09-19 ENCOUNTER — Other Ambulatory Visit (HOSPITAL_BASED_OUTPATIENT_CLINIC_OR_DEPARTMENT_OTHER): Payer: Self-pay

## 2021-09-19 ENCOUNTER — Ambulatory Visit: Payer: Medicare Other | Admitting: Family Medicine

## 2021-09-19 DIAGNOSIS — Z23 Encounter for immunization: Secondary | ICD-10-CM | POA: Diagnosis not present

## 2021-09-19 MED ORDER — COVID-19MRNA BIVAL VACC PFIZER 30 MCG/0.3ML IM SUSP
INTRAMUSCULAR | 0 refills | Status: DC
  Filled 2021-09-19: qty 0.3, 1d supply, fill #0

## 2021-09-28 ENCOUNTER — Other Ambulatory Visit: Payer: Self-pay | Admitting: Family Medicine

## 2021-10-03 ENCOUNTER — Other Ambulatory Visit: Payer: Self-pay

## 2021-10-03 ENCOUNTER — Ambulatory Visit (INDEPENDENT_AMBULATORY_CARE_PROVIDER_SITE_OTHER): Payer: Medicare Other

## 2021-10-03 DIAGNOSIS — Z23 Encounter for immunization: Secondary | ICD-10-CM | POA: Diagnosis not present

## 2021-10-05 ENCOUNTER — Other Ambulatory Visit: Payer: Self-pay | Admitting: Family Medicine

## 2021-10-19 ENCOUNTER — Other Ambulatory Visit: Payer: Self-pay | Admitting: Family Medicine

## 2021-10-19 DIAGNOSIS — K219 Gastro-esophageal reflux disease without esophagitis: Secondary | ICD-10-CM

## 2021-10-23 ENCOUNTER — Other Ambulatory Visit: Payer: Self-pay | Admitting: Family Medicine

## 2021-10-23 DIAGNOSIS — K5732 Diverticulitis of large intestine without perforation or abscess without bleeding: Secondary | ICD-10-CM

## 2021-10-24 ENCOUNTER — Encounter: Payer: Self-pay | Admitting: Nurse Practitioner

## 2021-11-04 ENCOUNTER — Other Ambulatory Visit: Payer: Self-pay

## 2021-11-04 ENCOUNTER — Emergency Department (INDEPENDENT_AMBULATORY_CARE_PROVIDER_SITE_OTHER)
Admission: EM | Admit: 2021-11-04 | Discharge: 2021-11-04 | Disposition: A | Discharge status: Home / Self Care | Payer: Medicare Other | Source: Home / Self Care | Attending: Family Medicine | Admitting: Family Medicine

## 2021-11-04 DIAGNOSIS — L089 Local infection of the skin and subcutaneous tissue, unspecified: Secondary | ICD-10-CM | POA: Diagnosis not present

## 2021-11-04 DIAGNOSIS — S0031XA Abrasion of nose, initial encounter: Secondary | ICD-10-CM

## 2021-11-04 MED ORDER — AMOXICILLIN-POT CLAVULANATE 875-125 MG PO TABS: 1.0000 | ORAL_TABLET | Freq: Two times a day (BID) | ORAL | 0 refills | Status: DC

## 2021-11-04 MED ORDER — MUPIROCIN 2 % EX OINT: 1.0000 "application " | TOPICAL_OINTMENT | Freq: Two times a day (BID) | CUTANEOUS | 0 refills | Status: DC

## 2021-11-04 NOTE — ED Provider Notes (Signed)
[name]    CSN: [id number] Arrival date & time: 11/04/21  0800      History   Chief Complaint Chief Complaint  Patient presents with   Foreign Body in [place]    HPI Zachary [name] is a 70 y.o. male.   HPI  Patient has a very painful sore on the inside of his nose.  Its on the septum in both nostrils.  Right greater than left.  No trauma.  No injury.  No recent illness or runny nose.  No known allergies He does use a CPAP but it is not humidified He does use Flonase as needed Past Medical History:  Diagnosis Date   Arthritis    Articular cartilage disease    left shoulder   Diverticulitis    Dyslipidemia (high LDL; low HDL) 08/15/2016   GERD (gastroesophageal reflux disease)    Hearing problem    hearing deficit   Hemorrhoids    Hypertension    Hypothyroidism    Prostate cancer ([place]) 2015   treated and in remission   Sleep apnea    uses a cpap   Wears glasses    Wears hearing aid    both ears    Patient Active Problem List   Diagnosis Date Noted   Hearing aid worn 02/28/2021   Coronary artery disease involving autologous artery coronary bypass graft with angina pectoris ([place]) 01/22/2021   DNR (do not resuscitate) 01/16/2021   Chest pain 01/15/2021   Prediabetes 05/27/2018   Stomatitis 04/17/2017   Dyslipidemia (high LDL; low HDL) 08/15/2016   S/P arthroscopy of shoulder 05/31/2016   History of prostate cancer 05/09/2016   Complete rotator cuff tear of left shoulder 07/05/2015   Tennis elbow 05/30/2014   Rotator cuff (capsule) sprain 03/10/2014   Right shoulder pain 02/11/2014   Memory difficulty 12/18/2012   Gynecomastia, male 07/30/2012   OSA (obstructive sleep apnea) 04/23/2011   EXTERNAL HEMORRHOIDS 02/12/2011   Prostate cancer ([place]) 11/12/2010   PLANTAR FASCIITIS 04/09/2010   Hypothyroidism 12/08/2009   NEOPLASM OF UNCERTAIN BEHAVIOR OF SKIN 03/10/2009   OSTEOARTHRITIS 02/14/2009   HEARING DEFICIT 02/08/2009   Essential  hypertension 02/08/2009   Allergic rhinitis 02/08/2009   GERD 02/08/2009   PROTEINURIA 02/08/2009   DIVERTICULITIS, HX OF 02/08/2009    Past Surgical History:  Procedure Laterality Date   BACK SURGERY  1978   herniated disksurgery   CARDIAC CATHETERIZATION  08/01/2010   COLONOSCOPY     EXAM UNDER ANESTHESIA WITH MANIPULATION OF SHOULDER Right 09/15/2014   Procedure: RIGHT SHOULDER MANIPULATION UNDER ANESTHESIA;  Surgeon: Ninetta Lights, MD;  Location: [facility];  Service: Orthopedics;  Laterality: Right;   HEMORRHOID SURGERY  08/2006   LEFT HEART CATH AND CORONARY ANGIOGRAPHY N/A 01/16/2021   Procedure: LEFT HEART CATH AND CORONARY ANGIOGRAPHY;  Surgeon: Troy Sine, MD;  Location: [facility];  Service: Cardiovascular;  Laterality: N/A;   SHOULDER ARTHROSCOPY WITH DISTAL CLAVICLE RESECTION Left 07/07/2015   Procedure: SHOULDER ARTHROSCOPY WITH DISTAL CLAVICLE RESECTION;  Surgeon: Kathryne Hitch, MD;  Location: [facility];  Service: Orthopedics;  Laterality: Left;   SHOULDER ARTHROSCOPY WITH ROTATOR CUFF REPAIR Left 12/07/2015   Procedure: LEFT SHOULDER ARTHROSCOPY DEBRIDEMENT, WITH ROTATOR CUFF REPAIR;  Surgeon: Ninetta Lights, MD;  Location: [facility];  Service: Orthopedics;  Laterality: Left;   SHOULDER ARTHROSCOPY WITH ROTATOR CUFF REPAIR AND SUBACROMIAL DECOMPRESSION Left 07/07/2015   Procedure: LEFT SHOULDER SCOPE DEBRIDEMENT, SUBACROMIAL DECOMPRESSION,  DISTAL CLAVICULECTOMY, ROTATOR CUFF REPAIR  ;  Surgeon: Kathryne Hitch, MD;  Location: [facility];  Service: Orthopedics;  Laterality: Left;  ANESTHESIA: GENERAL, PRE/POST OP SCALENE   SHOULDER ARTHROSCOPY WITH SUBACROMIAL DECOMPRESSION, ROTATOR CUFF REPAIR AND BICEP TENDON REPAIR Right 03/10/2014   Procedure: RIGHT SHOULDER ARTHROSCOPY WITH SUBACROMIAL DECOMPRESSION, PARTIAL ACROMIOPLASTY WITH CORACOAROMIAL LABRUM DEBRIDEMENT RELEASE DISTAL CLAVICULECTOMY,  ROTATOR CUFF  REPAIR AND EXTENSIVE DEBRIDEMENT;  Surgeon: Ninetta Lights, MD;  Location: [facility];  Service: Orthopedics;  Laterality: Right;   TENDON REPAIR  June 06, 2011   right elbow, Dr. Percell Miller       Home Medications    Prior to Admission medications   Medication Sig Start Date End Date Taking? Authorizing Provider  amoxicillin-clavulanate (AUGMENTIN) 875-125 MG tablet Take 1 tablet by mouth every 12 (twelve) hours. 11/04/21  Yes Raylene Everts, MD  mupirocin ointment (BACTROBAN) 2 % Apply 1 application topically 2 (two) times daily. 11/04/21  Yes Raylene Everts, MD  aspirin EC 81 MG EC tablet Take 81 mg by mouth daily.    [provider]  levothyroxine (SYNTHROID) 100 MCG tablet Take 1 tablet by mouth once daily 09/28/21   [name], DO  lisinopril (ZESTRIL) 5 MG tablet Take 1 tablet by mouth once daily 10/05/21   Nani Ravens, Crosby Oyster, DO  loratadine (CLARITIN) 10 MG tablet Take 10 mg by mouth daily.    [provider]  Melatonin 10 MG CAPS Take by mouth.    [provider]  nitroGLYCERIN (NITROSTAT) 0.4 MG SL tablet Place 1 tablet (0.4 mg total) under the tongue every 5 (five) minutes as needed for chest pain. 02/02/21 05/03/21  Tommie Raymond, NP  omeprazole (PRILOSEC) 20 MG capsule Take 1 capsule by mouth once daily 10/19/21   [name], DO  pravastatin (PRAVACHOL) 80 MG tablet TAKE 1 TABLET BY MOUTH ONCE DAILY AT 6 PM 09/10/21   [place], Crosby Oyster, DO  sodium bicarbonate 650 MG tablet Take 1 tablet by mouth once daily 10/19/21   [name], DO    Family History Family History  Problem Relation Age of Onset   Heart disease Father 61   Breast cancer Mother    Cancer Brother    Alcohol abuse Other    Arthritis Other    Cancer Other        Breast, Prostate   Coronary artery disease Other    Irritable bowel syndrome Other    Cystic fibrosis Other    Colon cancer Neg Hx     Social  History Social History   Tobacco Use   Smoking status: Never   Smokeless tobacco: Never  Vaping Use   Vaping Use: Never used  Substance Use Topics   Alcohol use: Yes    Comment: occasional   Drug use: No     Allergies   Oxycodone, Atorvastatin, and Simvastatin   Review of Systems Review of Systems See HPI  Physical Exam Triage Vital Signs ED Triage Vitals [11/04/21 0819]  Enc Vitals Group     BP 122/82     Pulse Rate (!) 58     Resp 16     Temp 98.3 F (36.8 C)     Temp Source Oral     SpO2 95 %     Weight      Height      Head Circumference      Peak Flow      Pain  Score 8     Pain Loc      Pain Edu?      Excl. in [place]?    No data found.  Updated Vital Signs BP 122/82 (BP Location: Left Arm)   Pulse (!) 58   Temp 98.3 F (36.8 C) (Oral)   Resp 16   SpO2 95%      Physical Exam Constitutional:      General: He is not in acute distress.    Appearance: He is well-developed and normal weight. He is not ill-appearing.  HENT:     Head: Normocephalic and atraumatic.     Right Ear: Tympanic membrane and ear canal normal.     Left Ear: Tympanic membrane and ear canal normal.     Nose:     Comments: On the septum bilaterally, easily visible from exterior, there is an ulceration on the right that is about 15 mm across, with a dark center.  On the left there is a smaller area of erythema that is small open    Mouth/Throat:     Pharynx: No oropharyngeal exudate or posterior oropharyngeal erythema.  Eyes:     Conjunctiva/sclera: Conjunctivae normal.     Pupils: Pupils are equal, round, and reactive to light.  Cardiovascular:     Rate and Rhythm: Normal rate.  Pulmonary:     Effort: Pulmonary effort is normal. No respiratory distress.  Abdominal:     General: There is no distension.     Palpations: Abdomen is soft.  Musculoskeletal:        General: Normal range of motion.     Cervical back: Normal range of motion.  Lymphadenopathy:     Cervical: No  cervical adenopathy.  Skin:    General: Skin is warm and dry.  Neurological:     Mental Status: He is alert.     UC Treatments / Results  Labs (all labs ordered are listed, but only abnormal results are displayed) Labs Reviewed - No data to display  EKG   Radiology No results found.  Procedures Procedures (including critical care time)  Medications Ordered in UC Medications - No data to display  Initial Impression / Assessment and Plan / UC Course  I have reviewed the triage vital signs and the nursing notes.  Pertinent labs & imaging results that were available during my care of the patient were reviewed by me and considered in my medical decision making (see chart for details).     I think the dryness from the CPAP and the warm air that starting with are colder weather has contributed to his nasal irritation.  We will treat with mupirocin.  Nose is very painful.  We will also give few days of Augmentin.  See PCP in follow-up Final Clinical Impressions(s) / UC Diagnoses   Final diagnoses:  Infected nasal abrasion, initial encounter     Discharge Instructions      Use the nasal ointment 2-3 times a day Moisturize the air, humidify your CPAP Use nasal saline to moisturize your nose Hold flonase until nose is healed Tale the antibiotic 3-5 days, until the nose improves   ED Prescriptions     Medication Sig Dispense Auth. Provider   mupirocin ointment (BACTROBAN) 2 % Apply 1 application topically 2 (two) times daily. 22 g Raylene Everts, MD   amoxicillin-clavulanate (AUGMENTIN) 875-125 MG tablet Take 1 tablet by mouth every 12 (twelve) hours. 10 tablet Raylene Everts, MD      PDMP  not reviewed this encounter.   Raylene Everts, MD 11/04/21 [phone]

## 2021-11-04 NOTE — Discharge Instructions (Signed)
Use the nasal ointment 2-3 times a day Moisturize the air, humidify your CPAP Use nasal saline to moisturize your nose Hold flonase until nose is healed Tale the antibiotic 3-5 days, until the nose improves

## 2021-11-04 NOTE — ED Triage Notes (Signed)
Pt present a sore inside of his nose, pt states that having some nasal drainage.

## 2021-11-06 ENCOUNTER — Other Ambulatory Visit: Payer: Self-pay | Admitting: Family Medicine

## 2021-11-06 DIAGNOSIS — Z1211 Encounter for screening for malignant neoplasm of colon: Secondary | ICD-10-CM

## 2021-11-06 DIAGNOSIS — K5732 Diverticulitis of large intestine without perforation or abscess without bleeding: Secondary | ICD-10-CM

## 2021-11-13 ENCOUNTER — Encounter: Payer: Self-pay | Admitting: Nurse Practitioner

## 2021-11-13 ENCOUNTER — Ambulatory Visit (INDEPENDENT_AMBULATORY_CARE_PROVIDER_SITE_OTHER): Payer: Medicare Other | Admitting: Nurse Practitioner

## 2021-11-13 ENCOUNTER — Encounter (HOSPITAL_BASED_OUTPATIENT_CLINIC_OR_DEPARTMENT_OTHER): Payer: Self-pay

## 2021-11-13 ENCOUNTER — Other Ambulatory Visit: Payer: Medicare Other

## 2021-11-13 ENCOUNTER — Telehealth: Payer: Self-pay | Admitting: Nurse Practitioner

## 2021-11-13 ENCOUNTER — Other Ambulatory Visit: Payer: Self-pay

## 2021-11-13 ENCOUNTER — Ambulatory Visit (HOSPITAL_BASED_OUTPATIENT_CLINIC_OR_DEPARTMENT_OTHER)
Admission: RE | Admit: 2021-11-13 | Discharge: 2021-11-13 | Disposition: A | Discharge status: Home / Self Care | Payer: Medicare Other | Source: Ambulatory Visit | Attending: Nurse Practitioner | Admitting: Nurse Practitioner

## 2021-11-13 ENCOUNTER — Other Ambulatory Visit (INDEPENDENT_AMBULATORY_CARE_PROVIDER_SITE_OTHER): Payer: Medicare Other

## 2021-11-13 VITALS — BP 114/72 | HR 66 | Ht 68.0 in | Wt 206.8 lb

## 2021-11-13 DIAGNOSIS — R197 Diarrhea, unspecified: Secondary | ICD-10-CM

## 2021-11-13 DIAGNOSIS — K5732 Diverticulitis of large intestine without perforation or abscess without bleeding: Secondary | ICD-10-CM

## 2021-11-13 DIAGNOSIS — A048 Other specified bacterial intestinal infections: Secondary | ICD-10-CM

## 2021-11-13 DIAGNOSIS — R109 Unspecified abdominal pain: Secondary | ICD-10-CM | POA: Diagnosis not present

## 2021-11-13 DIAGNOSIS — Z0389 Encounter for observation for other suspected diseases and conditions ruled out: Secondary | ICD-10-CM | POA: Diagnosis not present

## 2021-11-13 DIAGNOSIS — R1030 Lower abdominal pain, unspecified: Secondary | ICD-10-CM

## 2021-11-13 DIAGNOSIS — K529 Noninfective gastroenteritis and colitis, unspecified: Secondary | ICD-10-CM | POA: Diagnosis not present

## 2021-11-13 LAB — CBC WITH DIFFERENTIAL/PLATELET
Basophils Absolute: 0 10*3/uL (ref 0.0–0.1)
Basophils Relative: 0.6 % (ref 0.0–3.0)
Eosinophils Absolute: 0.1 10*3/uL (ref 0.0–0.7)
Eosinophils Relative: 2.5 % (ref 0.0–5.0)
HCT: 44.9 % (ref 39.0–52.0)
Hemoglobin: 15.1 g/dL (ref 13.0–17.0)
Lymphocytes Relative: 21.9 % (ref 12.0–46.0)
Lymphs Abs: 1.3 10*3/uL (ref 0.7–4.0)
MCHC: 33.7 g/dL (ref 30.0–36.0)
MCV: 92.8 fl (ref 78.0–100.0)
Monocytes Absolute: 0.5 10*3/uL (ref 0.1–1.0)
Monocytes Relative: 8.9 % (ref 3.0–12.0)
Neutro Abs: 3.9 10*3/uL (ref 1.4–7.7)
Neutrophils Relative %: 66.1 % (ref 43.0–77.0)
Platelets: 244 10*3/uL (ref 150.0–400.0)
RBC: 4.83 Mil/uL (ref 4.22–5.81)
RDW: 13.3 % (ref 11.5–15.5)
WBC: 5.9 10*3/uL (ref 4.0–10.5)

## 2021-11-13 LAB — COMPREHENSIVE METABOLIC PANEL
ALT: 23 U/L (ref 0–53)
AST: 19 U/L (ref 0–37)
Albumin: 4.5 g/dL (ref 3.5–5.2)
Alkaline Phosphatase: 55 U/L (ref 39–117)
BUN: 16 mg/dL (ref 6–23)
CO2: 28 mEq/L (ref 19–32)
Calcium: 10.1 mg/dL (ref 8.4–10.5)
Chloride: 102 mEq/L (ref 96–112)
Creatinine, Ser: 1.04 mg/dL (ref 0.40–1.50)
GFR: 74.07 mL/min (ref >60.00)
Glucose, Bld: 93 mg/dL (ref 70–99)
Potassium: 4.1 mEq/L (ref 3.5–5.1)
Sodium: 138 mEq/L (ref 135–145)
Total Bilirubin: 0.4 mg/dL (ref 0.2–1.2)
Total Protein: 7.1 g/dL (ref 6.0–8.3)

## 2021-11-13 MED ORDER — SUPREP BOWEL PREP KIT 17.5-3.13-1.6 GM/177ML PO SOLN: 1.0000 | ORAL | 0 refills | Status: DC

## 2021-11-13 MED ORDER — IOHEXOL 300 MG/ML  SOLN
80.0000 mL | Freq: Once | INTRAMUSCULAR | Status: AC | PRN
  Administered 2021-11-13: 80 mL via INTRAVENOUS

## 2021-11-13 NOTE — Telephone Encounter (Signed)
Patients wife called and said the Suprep was sent to the incorrect pharmacy is should go to [facility] on [address] in [place].

## 2021-11-13 NOTE — Telephone Encounter (Signed)
It was sent to [facility] as it may not be covered by insurance. However at this time we have not scheduled his colonoscopy because we are waiting on all his tests to come back in. Patient and wife notified to disregard the Suprep order at this time.

## 2021-11-13 NOTE — Patient Instructions (Addendum)
RECOMMENDATIONS: Florastor probiotic . Take 1 twice a day for 4-6 weeks. This is over the counter. Push fluid intake. Eat a bland diet. Go to the emergency room if you develop worsening abdominal pain or diarrhea.  LABS:  Lab work has been ordered for you today including stool tests. Our lab is located in the basement. Press "B" on the elevator. The lab is located at the first door on the left as you exit the elevator.  HEALTHCARE LAWS AND MY CHART RESULTS: Due to recent changes in healthcare laws, you may see the results of your imaging and laboratory studies on MyChart before your provider has had a chance to review them.   We understand that in some cases there may be results that are confusing or concerning to you. Not all laboratory results come back in the same time frame and the provider may be waiting for multiple results in order to interpret others.  Please give us 48 hours in order for your provider to thoroughly review all the results before contacting the office for clarification of your results.   You have been scheduled for a CT scan of the abdomen and pelvis at [facility]. Located at [address], .  You are scheduled on Today at 2:15pm. You should arrive 15 minutes prior to your appointment time for registration. Please follow the written instructions below on the day of your exam:  WARNING: IF YOU ARE ALLERGIC TO IODINE/X-RAY DYE, PLEASE NOTIFY RADIOLOGY IMMEDIATELY AT [phone]! YOU WILL BE GIVEN A 13 HOUR PREMEDICATION PREP.  1) Do not eat or drink anything after until after the CT scan. 2) You have been given 2 bottles of oral contrast to drink. The solution may taste better if refrigerated, but do NOT add ice or any other liquid to this solution. Shake well before drinking.    Drink 1 bottle of contrast @ 12:30pm (2 hours prior to your exam)  Drink 1 bottle of contrast @ 1:30pm (1 hour prior to your exam)  You may take any  medications as prescribed with a small amount of water, if necessary. If you take any of the following medications: METFORMIN, GLUCOPHAGE, GLUCOVANCE, AVANDAMET, RIOMET, FORTAMET, ACTOPLUS MET, JANUMET, GLUMETZA or METAGLIP, you MAY be asked to HOLD this medication 48 hours AFTER the exam.  The purpose of you drinking the oral contrast is to aid in the visualization of your intestinal tract. The contrast solution may cause some diarrhea. Depending on your individual set of symptoms, you may also receive an intravenous injection of x-ray contrast/dye. Plan on being at [facility] for 30 minutes or longer, depending on the type of exam you are having performed.  This test typically takes 30-45 minutes to complete.   ________________________________________________________________________  

## 2021-11-13 NOTE — Progress Notes (Signed)
Noted  

## 2021-11-13 NOTE — Telephone Encounter (Signed)
I advise the patients wife about the message below and she understood and agreed with plan.

## 2021-11-13 NOTE — Progress Notes (Signed)
11/13/2021 Zachary [name] [id number] [birth year]   CHIEF COMPLAINT:  Abdominal pain, diarrhea   HISTORY OF PRESENT ILLNESS:  Altan Kraai. Force is a 70 year old male with a past medical history of arthritis, hypertension, hypothyroidism, mild nonobstructive CAD per cardiac catheterization 12/2020, sleep apnea uses cpap, prostate cancer 2018 s/p prostatectomy followed by adjunct radiation 2019, s/p cystitis cystica (secondary from radiation) removal followed by hyperbaric oxygen treatments 07/2019, GERD, colon polyps and diverticulitis 02/2021 and 07/2021.  He presents to our office today for further evaluation regarding recurrent diverticulitis and diarrhea. He developed severe abdominal pain x 2 days with 4 episodes of nonbloody diarrhea then presented to [facility] on 08/10/2021. Labs showed a normal WBC count of 9.8. CTAP identified acute sigmoid diverticulitis. He was prescribed Cipro 500mg  po bid and Flagyl 500mg  po bid x 10 days. His lower abdominal pain improved but he continued to have daily diarrhea. He was seen by Dr. Jone Baseman 09/14/2021 with worsening LLQ pain and he was prescribed a course of Augmentin for suspected recurrent diverticulitis and he was referred to general surgery for possible sigmoid resection (appointment not yet scheduled).  He continues to have lower abdominal pain which is worse to the LLQ and radiates to the left mid abdomen and is often worse at nighttime.  He rates his abdominal pain a 4 during the day and 8 most nights.  He passes nonbloody brown watery diarrhea 3-5 times daily.  He passed a solid stool about 10 days ago which occurs infrequently.  He was also had LLQ pain treated with Augmentin by Dr. Jone Baseman 02/28/2021 with LLQ pain which resulted in explosive diarrhea so he stopped taking it after 3 days.   He was seen at urgent care due to having a painful sores in his nostrils.  He was prescribed Augmentin 875/125 mg 1 tab p.o. twice daily for 5 days and  Bactroban ointment by the urgent care provider.  He underwent a colonoscopy by Dr. Henrene Pastor 11/06/2016 which identified a 71mm tubular adenomatous polyp removed from the transverse colon and diverticulosis to the sigmoid colon.  A repeat colonoscopy in 5 years was recommended.  His GERD symptoms are stable on Omeprazole 20 mg daily.  No heartburn, dysphagia or upper abdominal pain.   CBC Latest Ref Rng & Units 08/10/2021 01/17/2021 01/15/2021  WBC 4.0 - 10.5 K/uL 9.8 6.7 7.5  Hemoglobin 13.0 - 17.0 g/dL 15.9 14.5 15.9  Hematocrit 39.0 - 52.0 % 45.1 42.9 46.4  Platelets 150 - 400 K/uL 254 218 252    CMP Latest Ref Rng & Units 09/14/2021 08/17/2021 08/10/2021  Glucose 70 - 99 mg/dL - 89 112(H)  BUN 6 - 23 mg/dL - 20 14  Creatinine 0.40 - 1.50 mg/dL - 1.15 1.03  Sodium 135 - 145 mEq/L - 140 134(L)  Potassium 3.5 - 5.1 mEq/L - 4.4 4.1  Chloride 96 - 112 mEq/L - 104 100  CO2 19 - 32 mEq/L - 27 24  Calcium 8.4 - 10.5 mg/dL - 10.1 9.7  Total Protein 6.0 - 8.3 g/dL 6.6 6.8 7.4  Total Bilirubin 0.2 - 1.2 mg/dL 0.5 0.4 0.8  Alkaline Phos 39 - 117 U/L 54 50 55  AST 0 - 37 U/L 18 38(H) 21  ALT 0 - 53 U/L 23 51 24     CTAP 08/10/2021: 1. Acute sigmoid diverticulitis. No perforation or abscess. 2. Aortic Atherosclerosis   Colonoscopy 11/152017 by Dr. Henrene Pastor: - One 2 mm polyp in the  proximal transverse colon, removed with a cold snare. Resected and retrieved. - Diverticulosis in the sigmoid colon. - Internal hemorrhoids. - The examination was otherwise normal on direct and retroflexion views. - 5 year colonoscopy recall Surgical [P], transverse, polyp - TUBULAR ADENOMA(S). - HIGH GRADE DYSPLASIA IS NOT IDENTIFIED.  EGD 06/16/2006 by Dr. Shawnie Pons: Nonerosive acid reflux  Colonoscopy 06/16/2006: Diverticulosis Internal and external hemorrhoids   Past Medical History:  Diagnosis Date   Arthritis    Articular cartilage disease    left shoulder   Diverticulitis    Dyslipidemia (high LDL; low  HDL) 08/15/2016   GERD (gastroesophageal reflux disease)    Hearing problem    hearing deficit   Hemorrhoids    Hypertension    Hypothyroidism    Prostate cancer ([place]) 2015   treated and in remission   Sleep apnea    uses a cpap   Wears glasses    Wears hearing aid    both ears   Past Surgical History:  Procedure Laterality Date   BACK SURGERY  1978   herniated disksurgery   CARDIAC CATHETERIZATION  08/01/2010   COLONOSCOPY     EXAM UNDER ANESTHESIA WITH MANIPULATION OF SHOULDER Right 09/15/2014   Procedure: RIGHT SHOULDER MANIPULATION UNDER ANESTHESIA;  Surgeon: Ninetta Lights, MD;  Location: [place];  Service: Orthopedics;  Laterality: Right;   HEMORRHOID SURGERY  08/2006   LEFT HEART CATH AND CORONARY ANGIOGRAPHY N/A 01/16/2021   Procedure: LEFT HEART CATH AND CORONARY ANGIOGRAPHY;  Surgeon: Troy Sine, MD;  Location: [place] [facility];  Service: Cardiovascular;  Laterality: N/A;   SHOULDER ARTHROSCOPY WITH DISTAL CLAVICLE RESECTION Left 07/07/2015   Procedure: SHOULDER ARTHROSCOPY WITH DISTAL CLAVICLE RESECTION;  Surgeon: Kathryne Hitch, MD;  Location: [place];  Service: Orthopedics;  Laterality: Left;   SHOULDER ARTHROSCOPY WITH ROTATOR CUFF REPAIR Left 12/07/2015   Procedure: LEFT SHOULDER ARTHROSCOPY DEBRIDEMENT, WITH ROTATOR CUFF REPAIR;  Surgeon: Ninetta Lights, MD;  Location: [place];  Service: Orthopedics;  Laterality: Left;   SHOULDER ARTHROSCOPY WITH ROTATOR CUFF REPAIR AND SUBACROMIAL DECOMPRESSION Left 07/07/2015   Procedure: LEFT SHOULDER SCOPE DEBRIDEMENT, SUBACROMIAL DECOMPRESSION, DISTAL CLAVICULECTOMY, ROTATOR CUFF REPAIR  ;  Surgeon: Kathryne Hitch, MD;  Location: [place];  Service: Orthopedics;  Laterality: Left;  ANESTHESIA: GENERAL, PRE/POST OP SCALENE   SHOULDER ARTHROSCOPY WITH SUBACROMIAL DECOMPRESSION, ROTATOR CUFF REPAIR AND BICEP TENDON REPAIR Right 03/10/2014   Procedure: RIGHT  SHOULDER ARTHROSCOPY WITH SUBACROMIAL DECOMPRESSION, PARTIAL ACROMIOPLASTY WITH CORACOAROMIAL LABRUM DEBRIDEMENT RELEASE DISTAL CLAVICULECTOMY,  ROTATOR CUFF REPAIR AND EXTENSIVE DEBRIDEMENT;  Surgeon: Ninetta Lights, MD;  Location: [place];  Service: Orthopedics;  Laterality: Right;   TENDON REPAIR  June 06, 2011   right elbow, Dr. Percell Miller   Social History: He is married.  He has 1 son and 1 daughter.  He is retired.  Non-smoker.  He drinks 1 or 2 glasses of wine monthly.  No drug use.  Family History: Mother had breast cancer and "colitis".  Father with heart disease.  Cystic fibrosis in distant family member.   Allergies  Allergen Reactions   Oxycodone Itching   Atorvastatin Other (See Comments)   Simvastatin Other (See Comments)    REACTION: muscle pain     Outpatient Encounter Medications as of 11/13/2021  Medication Sig   amoxicillin-clavulanate (AUGMENTIN) 875-125 MG tablet Take 1 tablet by mouth every 12 (twelve) hours.   aspirin EC 81 MG EC tablet Take  81 mg by mouth daily.   levothyroxine (SYNTHROID) 100 MCG tablet Take 1 tablet by mouth once daily   lisinopril (ZESTRIL) 5 MG tablet Take 1 tablet by mouth once daily   loratadine (CLARITIN) 10 MG tablet Take 10 mg by mouth daily.   Melatonin 10 MG CAPS Take by mouth.   mupirocin ointment (BACTROBAN) 2 % Apply 1 application topically 2 (two) times daily.   nitroGLYCERIN (NITROSTAT) 0.4 MG SL tablet Place 1 tablet (0.4 mg total) under the tongue every 5 (five) minutes as needed for chest pain.   omeprazole (PRILOSEC) 20 MG capsule Take 1 capsule by mouth once daily   pravastatin (PRAVACHOL) 80 MG tablet TAKE 1 TABLET BY MOUTH ONCE DAILY AT 6 PM   sodium bicarbonate 650 MG tablet Take 1 tablet by mouth once daily   No facility-administered encounter medications on file as of 11/13/2021.    REVIEW OF SYSTEMS: Gen: Denies fever, sweats or chills. No weight loss.  CV: Denies chest pain, palpitations or  edema. Resp: Denies cough, shortness of breath of hemoptysis.  GI: Denies heartburn, dysphagia, stomach or lower abdominal pain. No diarrhea or constipation.  GU : Denies urinary burning, blood in urine, increased urinary frequency or incontinence. MS: Denies joint pain, muscles aches or weakness. Derm: Denies rash, itchiness, skin lesions or unhealing ulcers. Psych: Denies depression, anxiety, memory loss, suicidal ideation and confusion. Heme: Denies bruising, bleeding. Neuro:  Denies headaches, dizziness or paresthesias. Endo:  Denies any problems with DM, thyroid or adrenal function.   PHYSICAL EXAM: BP 114/72   Pulse 66   Ht 5\' 8"  (1.727 m)   Wt 206 lb 12.8 oz (93.8 kg)   SpO2 99%   BMI 31.44 kg/m   General: 70 year old male in no acute distress.  Head: Normocephalic and atraumatic. Eyes:  Sclerae non-icteric, conjunctive pink. Ears: Normal auditory acuity. Mouth: Dentition intact. No ulcers or lesions.  Neck: Supple, no lymphadenopathy or thyromegaly.  Lungs: Clear bilaterally to auscultation without wheezes, crackles or rhonchi. Heart: Regular rate and rhythm. No murmur, rub or gallop appreciated.  Abdomen: Soft, nontender, non distended. No masses. No hepatosplenomegaly. Normoactive bowel sounds x 4 quadrants.  Rectal: Deferred/  Musculoskeletal: Symmetrical with no gross deformities. Skin: Warm and dry. No rash or lesions on visible extremities. Extremities: No edema. Neurological: Alert oriented x 4, no focal deficits.  Psychological:  Alert and cooperative. Normal mood and affect.  ASSESSMENT AND PLAN:  24) 70 year old male with sigmoid diverticulitis confirmed by CTAP 07/2021 with persistent lower abdominal pain and diarrhea treated with several courses of antibiotics concerning for C. Diff collitis +/- smoldering diverticulitis. -CBC, CMP -GI pathogen panel to include C. Diff PCR -Abd/pelvic CT with oral and IV contrast  -Florastor probiotic one po bid x 4 to 6  weeks  -Bland diet, push fluids  -Eventual colonoscopy, timing  to be determined after the above CT and lab results reviewed  -Patient to present to the ED if he develops worsening abdominal pain or worsening diarrhea  2) History of a 43mm tubular adenomatous colon polyp per colonoscopy 2017 -Eventual colonoscopy, see plan in # 1  3) Nonobstructive CAD  4) History of prostate cancer status post prostatectomy with radiation complicated by radiation cystitis requiring hyperbaric therapy   CC:  [name]*

## 2021-11-18 LAB — GI PROFILE, STOOL, PCR

## 2021-11-19 ENCOUNTER — Other Ambulatory Visit: Payer: Self-pay

## 2021-11-19 ENCOUNTER — Encounter: Payer: Self-pay | Admitting: Internal Medicine

## 2021-11-19 MED ORDER — DICYCLOMINE HCL 10 MG PO CAPS: 10.0000 mg | ORAL_CAPSULE | Freq: Three times a day (TID) | ORAL | 1 refills | Status: DC

## 2021-11-19 NOTE — Telephone Encounter (Signed)
Beth, pls contact patient and let him know I did discuss possible steroid use for sclerosing mesenteritis if his symptoms worsened. Since he is still having abdominal pain pls send in RX for Dicyclomine 10mg  one tab po tid PRN for abdominal pain # 30, 1 refill and if can tolerated NSAIDS to take Ibuprofen 200mg  three tabs po bid x 5 to 7 days with food. Continue Florastor twice daily. Patient to contact office if his symptoms do not improve or if his symptoms worsen. THX

## 2021-12-13 ENCOUNTER — Other Ambulatory Visit: Payer: Self-pay | Admitting: Family Medicine

## 2021-12-27 ENCOUNTER — Other Ambulatory Visit: Payer: Self-pay | Admitting: Family Medicine

## 2022-01-01 ENCOUNTER — Other Ambulatory Visit: Payer: Self-pay | Admitting: Internal Medicine

## 2022-01-01 ENCOUNTER — Ambulatory Visit (AMBULATORY_SURGERY_CENTER): Payer: Medicare Other

## 2022-01-01 ENCOUNTER — Other Ambulatory Visit: Payer: Self-pay

## 2022-01-01 VITALS — Ht 68.0 in | Wt 206.0 lb

## 2022-01-01 DIAGNOSIS — Z86010 Personal history of colonic polyps: Secondary | ICD-10-CM

## 2022-01-01 MED ORDER — SUTAB 1479-225-188 MG PO TABS: 12.0000 | ORAL_TABLET | ORAL | 0 refills | Status: DC

## 2022-01-01 NOTE — Progress Notes (Signed)
° ° ° °  Patient's pre-visit was done today over the phone with the patient   Name,DOB and address verified.   Patient denies any allergies to Eggs and Soy.  Patient denies any problems with anesthesia/sedation. Patient denies taking diet pills or blood thinners.  Denies atrial flutter or atrial fib Denies chronic constipation No home Oxygen.   Packet of Prep instructions mailed to patient including a copy of a consent form-pt is aware.  Patient understands to call us back with any questions or concerns.  Patient is aware of our care-partner policy and IFBPP-94 safety protocol.    Pt and wife notes that he can have urinary leakage and would like to wear a pad until it is necessary to be without.

## 2022-01-03 ENCOUNTER — Other Ambulatory Visit: Payer: Self-pay | Admitting: Family Medicine

## 2022-01-14 ENCOUNTER — Encounter: Payer: Self-pay | Admitting: Internal Medicine

## 2022-01-14 ENCOUNTER — Other Ambulatory Visit: Payer: Self-pay

## 2022-01-14 ENCOUNTER — Ambulatory Visit (AMBULATORY_SURGERY_CENTER): Payer: Medicare Other | Admitting: Internal Medicine

## 2022-01-14 VITALS — BP 91/67 | HR 59 | Temp 98.3°F | Resp 17 | Ht 68.0 in | Wt 206.0 lb

## 2022-01-14 DIAGNOSIS — R197 Diarrhea, unspecified: Secondary | ICD-10-CM

## 2022-01-14 DIAGNOSIS — K5730 Diverticulosis of large intestine without perforation or abscess without bleeding: Secondary | ICD-10-CM

## 2022-01-14 DIAGNOSIS — K648 Other hemorrhoids: Secondary | ICD-10-CM | POA: Diagnosis not present

## 2022-01-14 DIAGNOSIS — Z86010 Personal history of colonic polyps: Secondary | ICD-10-CM

## 2022-01-14 DIAGNOSIS — R1030 Lower abdominal pain, unspecified: Secondary | ICD-10-CM | POA: Diagnosis not present

## 2022-01-14 MED ORDER — SODIUM CHLORIDE 0.9 % IV SOLN: 500.0000 mL | Freq: Once | INTRAVENOUS | Status: DC

## 2022-01-14 NOTE — Op Note (Signed)
[facility] Patient Name: Zachary [name] Procedure Date: 01/14/2022 10:01 AM MRN: [id number] Endoscopist: Docia Chuck. Henrene Pastor , MD Age: 71 Referring MD:  Date of Birth: 07-10-1951 Gender: Male Account #: [id number] Procedure:                Colonoscopy with biopsies Indications:              High risk colon cancer surveillance: Personal                            history of non-advanced adenoma. Index examination                            2007 ([place]) negative for                            neoplasia. Last examination 2017 with diminutive                            adenoma. Seen in the office in November for left                            lower quadrant pain and diarrhea. Still having                            intermittent problems, though improving. Medicines:                Monitored Anesthesia Care Procedure:                Pre-Anesthesia Assessment:                           - Prior to the procedure, a History and Physical                            was performed, and patient medications and                            allergies were reviewed. The patient's tolerance of                            previous anesthesia was also reviewed. The risks                            and benefits of the procedure and the sedation                            options and risks were discussed with the patient.                            All questions were answered, and informed consent                            was obtained. Prior Anticoagulants: The patient has  taken no previous anticoagulant or antiplatelet                            agents. ASA Grade Assessment: II - A patient with                            mild systemic disease. After reviewing the risks                            and benefits, the patient was deemed in                            satisfactory condition to undergo the procedure.                           After obtaining  informed consent, the colonoscope                            was passed under direct vision. Throughout the                            procedure, the patient's blood pressure, pulse, and                            oxygen saturations were monitored continuously. The                            Olympus Colonoscope #[id number] was introduced through                            the anus and advanced to the the cecum, identified                            by appendiceal orifice and ileocecal valve. The                            ileocecal valve, appendiceal orifice, and rectum                            were photographed. The quality of the bowel                            preparation was excellent. The colonoscopy was                            performed without difficulty. The patient tolerated                            the procedure well. The bowel preparation used was                            SUPREP via split dose instruction. Scope In: 10:22:00 AM Scope Out: 10:35:10 AM Scope Withdrawal Time: 0 hours 11 minutes 14 seconds  Total Procedure  Duration: 0 hours 13 minutes 10 seconds  Findings:                 Multiple diverticula were found in the sigmoid                            colon. Very mild radiation proctitis. Small                            internal hemorrhoids.                           The entire examined colon appeared otherwise normal                            on direct and retroflexion views. Biopsies for                            histology were taken with a cold forceps from the                            entire colon for evaluation of microscopic colitis. Complications:            No immediate complications. Estimated blood loss:                            None. Estimated Blood Loss:     Estimated blood loss: none. Impression:               - Diverticulosis in the sigmoid colon. Mild                            radiation proctitis. Internal hemorrhoids.                            - The entire examined colon is otherwise normal on                            direct and retroflexion views. Recommendation:           - Repeat colonoscopy in 10 years for surveillance.                           - Patient has a contact number available for                            emergencies. The signs and symptoms of potential                            delayed complications were discussed with the                            patient. Return to normal activities tomorrow.                            Written discharge instructions were provided to the  patient.                           - Resume previous diet.                           - Continue present medications.                           - Await pathology results. Docia Chuck. Henrene Pastor, MD 01/14/2022 10:42:51 AM This report has been signed electronically.

## 2022-01-14 NOTE — Progress Notes (Signed)
PT taken to PACU. Monitors in place. VSS. Report given to RN. 

## 2022-01-14 NOTE — Progress Notes (Signed)
CHIEF COMPLAINT:  Abdominal pain, diarrhea    HISTORY OF PRESENT ILLNESS:  Zachary [name] is a 71 year old male with a past medical history of arthritis, hypertension, hypothyroidism, mild nonobstructive CAD per cardiac catheterization 12/2020, sleep apnea uses cpap, prostate cancer 2018 s/p prostatectomy followed by adjunct radiation 2019, s/p cystitis cystica (secondary from radiation) removal followed by hyperbaric oxygen treatments 07/2019, GERD, colon polyps and diverticulitis 02/2021 and 07/2021.  He presents to our office today for further evaluation regarding recurrent diverticulitis and diarrhea. He developed severe abdominal pain x 2 days with 4 episodes of nonbloody diarrhea then presented to [facility] on 08/10/2021. Labs showed a normal WBC count of 9.8. CTAP identified acute sigmoid diverticulitis. He was prescribed Cipro 500mg  po bid and Flagyl 500mg  po bid x 10 days. His lower abdominal pain improved but he continued to have daily diarrhea. He was seen by Dr. Jone Baseman 09/14/2021 with worsening LLQ pain and he was prescribed a course of Augmentin for suspected recurrent diverticulitis and he was referred to general surgery for possible sigmoid resection (appointment not yet scheduled).  He continues to have lower abdominal pain which is worse to the LLQ and radiates to the left mid abdomen and is often worse at nighttime.  He rates his abdominal pain a 4 during the day and 8 most nights.  He passes nonbloody brown watery diarrhea 3-5 times daily.  He passed a solid stool about 10 days ago which occurs infrequently.  He was also had LLQ pain treated with Augmentin by Dr. Jone Baseman 02/28/2021 with LLQ pain which resulted in explosive diarrhea so he stopped taking it after 3 days.    He was seen at urgent care due to having a painful sores in his nostrils.  He was prescribed Augmentin 875/125 mg 1 tab p.o. twice daily for 5 days and Bactroban ointment by the urgent care provider.   He underwent  a colonoscopy by Dr. Henrene Pastor 11/06/2016 which identified a 39mm tubular adenomatous polyp removed from the transverse colon and diverticulosis to the sigmoid colon.  A repeat colonoscopy in 5 years was recommended.   His GERD symptoms are stable on Omeprazole 20 mg daily.  No heartburn, dysphagia or upper abdominal pain.     CBC Latest Ref Rng & Units 08/10/2021 01/17/2021 01/15/2021  WBC 4.0 - 10.5 K/uL 9.8 6.7 7.5  Hemoglobin 13.0 - 17.0 g/dL 15.9 14.5 15.9  Hematocrit 39.0 - 52.0 % 45.1 42.9 46.4  Platelets 150 - 400 K/uL 254 218 252    CMP Latest Ref Rng & Units 09/14/2021 08/17/2021 08/10/2021  Glucose 70 - 99 mg/dL - 89 112(H)  BUN 6 - 23 mg/dL - 20 14  Creatinine 0.40 - 1.50 mg/dL - 1.15 1.03  Sodium 135 - 145 mEq/L - 140 134(L)  Potassium 3.5 - 5.1 mEq/L - 4.4 4.1  Chloride 96 - 112 mEq/L - 104 100  CO2 19 - 32 mEq/L - 27 24  Calcium 8.4 - 10.5 mg/dL - 10.1 9.7  Total Protein 6.0 - 8.3 g/dL 6.6 6.8 7.4  Total Bilirubin 0.2 - 1.2 mg/dL 0.5 0.4 0.8  Alkaline Phos 39 - 117 U/L 54 50 55  AST 0 - 37 U/L 18 38(H) 21  ALT 0 - 53 U/L 23 51 24      CTAP 08/10/2021: 1. Acute sigmoid diverticulitis. No perforation or abscess. 2. Aortic Atherosclerosis    Colonoscopy 11/152017 by Dr. Henrene Pastor: - One 2 mm polyp in the proximal transverse colon,  removed with a cold snare. Resected and retrieved. - Diverticulosis in the sigmoid colon. - Internal hemorrhoids. - The examination was otherwise normal on direct and retroflexion views. - 5 year colonoscopy recall Surgical [P], transverse, polyp - TUBULAR ADENOMA(S). - HIGH GRADE DYSPLASIA IS NOT IDENTIFIED.   EGD 06/16/2006 by Dr. Shawnie Pons: Nonerosive acid reflux   Colonoscopy 06/16/2006: Diverticulosis Internal and external hemorrhoids        Past Medical History:  Diagnosis Date   Arthritis     Articular cartilage disease      left shoulder   Diverticulitis     Dyslipidemia (high LDL; low HDL) 08/15/2016   GERD (gastroesophageal  reflux disease)     Hearing problem      hearing deficit   Hemorrhoids     Hypertension     Hypothyroidism     Prostate cancer ([place]) 2015    treated and in remission   Sleep apnea      uses a cpap   Wears glasses     Wears hearing aid      both ears         Past Surgical History:  Procedure Laterality Date   BACK SURGERY   1978    herniated disksurgery   CARDIAC CATHETERIZATION   08/01/2010   COLONOSCOPY       EXAM UNDER ANESTHESIA WITH MANIPULATION OF SHOULDER Right 09/15/2014    Procedure: RIGHT SHOULDER MANIPULATION UNDER ANESTHESIA;  Surgeon: Ninetta Lights, MD;  Location: [place];  Service: Orthopedics;  Laterality: Right;   HEMORRHOID SURGERY   08/2006   LEFT HEART CATH AND CORONARY ANGIOGRAPHY N/A 01/16/2021    Procedure: LEFT HEART CATH AND CORONARY ANGIOGRAPHY;  Surgeon: Troy Sine, MD;  Location: [facility];  Service: Cardiovascular;  Laterality: N/A;   SHOULDER ARTHROSCOPY WITH DISTAL CLAVICLE RESECTION Left 07/07/2015    Procedure: SHOULDER ARTHROSCOPY WITH DISTAL CLAVICLE RESECTION;  Surgeon: Kathryne Hitch, MD;  Location: [place];  Service: Orthopedics;  Laterality: Left;   SHOULDER ARTHROSCOPY WITH ROTATOR CUFF REPAIR Left 12/07/2015    Procedure: LEFT SHOULDER ARTHROSCOPY DEBRIDEMENT, WITH ROTATOR CUFF REPAIR;  Surgeon: Ninetta Lights, MD;  Location: [place];  Service: Orthopedics;  Laterality: Left;   SHOULDER ARTHROSCOPY WITH ROTATOR CUFF REPAIR AND SUBACROMIAL DECOMPRESSION Left 07/07/2015    Procedure: LEFT SHOULDER SCOPE DEBRIDEMENT, SUBACROMIAL DECOMPRESSION, DISTAL CLAVICULECTOMY, ROTATOR CUFF REPAIR  ;  Surgeon: Kathryne Hitch, MD;  Location: [place];  Service: Orthopedics;  Laterality: Left;  ANESTHESIA: GENERAL, PRE/POST OP SCALENE   SHOULDER ARTHROSCOPY WITH SUBACROMIAL DECOMPRESSION, ROTATOR CUFF REPAIR AND BICEP TENDON REPAIR Right 03/10/2014    Procedure: RIGHT SHOULDER  ARTHROSCOPY WITH SUBACROMIAL DECOMPRESSION, PARTIAL ACROMIOPLASTY WITH CORACOAROMIAL LABRUM DEBRIDEMENT RELEASE DISTAL CLAVICULECTOMY,  ROTATOR CUFF REPAIR AND EXTENSIVE DEBRIDEMENT;  Surgeon: Ninetta Lights, MD;  Location: [place];  Service: Orthopedics;  Laterality: Right;   TENDON REPAIR   June 06, 2011    right elbow, Dr. Percell Miller    Social History: He is married.  He has 1 son and 1 daughter.  He is retired.  Non-smoker.  He drinks 1 or 2 glasses of wine monthly.  No drug use.   Family History: Mother had breast cancer and "colitis".  Father with heart disease.  Cystic fibrosis in distant family member.          Allergies  Allergen Reactions   Oxycodone Itching   Atorvastatin Other (See Comments)  Simvastatin Other (See Comments)      REACTION: muscle pain          Outpatient Encounter Medications as of 11/13/2021  Medication Sig   amoxicillin-clavulanate (AUGMENTIN) 875-125 MG tablet Take 1 tablet by mouth every 12 (twelve) hours.   aspirin EC 81 MG EC tablet Take 81 mg by mouth daily.   levothyroxine (SYNTHROID) 100 MCG tablet Take 1 tablet by mouth once daily   lisinopril (ZESTRIL) 5 MG tablet Take 1 tablet by mouth once daily   loratadine (CLARITIN) 10 MG tablet Take 10 mg by mouth daily.   Melatonin 10 MG CAPS Take by mouth.   mupirocin ointment (BACTROBAN) 2 % Apply 1 application topically 2 (two) times daily.   nitroGLYCERIN (NITROSTAT) 0.4 MG SL tablet Place 1 tablet (0.4 mg total) under the tongue every 5 (five) minutes as needed for chest pain.   omeprazole (PRILOSEC) 20 MG capsule Take 1 capsule by mouth once daily   pravastatin (PRAVACHOL) 80 MG tablet TAKE 1 TABLET BY MOUTH ONCE DAILY AT 6 PM   sodium bicarbonate 650 MG tablet Take 1 tablet by mouth once daily    No facility-administered encounter medications on file as of 11/13/2021.      REVIEW OF SYSTEMS: Gen: Denies fever, sweats or chills. No weight loss.  CV: Denies chest pain,  palpitations or edema. Resp: Denies cough, shortness of breath of hemoptysis.  GI: Denies heartburn, dysphagia, stomach or lower abdominal pain. No diarrhea or constipation.  GU : Denies urinary burning, blood in urine, increased urinary frequency or incontinence. MS: Denies joint pain, muscles aches or weakness. Derm: Denies rash, itchiness, skin lesions or unhealing ulcers. Psych: Denies depression, anxiety, memory loss, suicidal ideation and confusion. Heme: Denies bruising, bleeding. Neuro:  Denies headaches, dizziness or paresthesias. Endo:  Denies any problems with DM, thyroid or adrenal function.     PHYSICAL EXAM: BP 114/72    Pulse 66    Ht 5\' 8"  (1.727 m)    Wt 206 lb 12.8 oz (93.8 kg)    SpO2 99%    BMI 31.44 kg/m    General: 71 year old male in no acute distress.  Head: Normocephalic and atraumatic. Eyes:  Sclerae non-icteric, conjunctive pink. Ears: Normal auditory acuity. Mouth: Dentition intact. No ulcers or lesions.  Neck: Supple, no lymphadenopathy or thyromegaly.  Lungs: Clear bilaterally to auscultation without wheezes, crackles or rhonchi. Heart: Regular rate and rhythm. No murmur, rub or gallop appreciated.  Abdomen: Soft, nontender, non distended. No masses. No hepatosplenomegaly. Normoactive bowel sounds x 4 quadrants.  Rectal: Deferred/  Musculoskeletal: Symmetrical with no gross deformities. Skin: Warm and dry. No rash or lesions on visible extremities. Extremities: No edema. Neurological: Alert oriented x 4, no focal deficits.  Psychological:  Alert and cooperative. Normal mood and affect.   ASSESSMENT AND PLAN:   83) 71 year old male with sigmoid diverticulitis confirmed by CTAP 07/2021 with persistent lower abdominal pain and diarrhea treated with several courses of antibiotics concerning for C. Diff collitis +/- smoldering diverticulitis. -CBC, CMP -GI pathogen panel to include C. Diff PCR -Abd/pelvic CT with oral and IV contrast  -Florastor probiotic  one po bid x 4 to 6 weeks  -Bland diet, push fluids  -Eventual colonoscopy, timing  to be determined after the above CT and lab results reviewed  -Patient to present to the ED if he develops worsening abdominal pain or worsening diarrhea   2) History of a 28mm tubular adenomatous colon polyp per colonoscopy 2017 -  Eventual colonoscopy, see plan in # 1   3) Nonobstructive CAD   4) History of prostate cancer status post prostatectomy with radiation complicated by radiation cystitis requiring hyperbaric therapy  Patient was thoroughly evaluated in the office November 13, 2021.  See above.  GI pathogen panel was negative.  CBC and comprehensive metabolic panel were normal.  CT scan suggested sclerosing mesenteritis.  No active diverticulitis or colitis.  He is now for colonoscopy.

## 2022-01-14 NOTE — Progress Notes (Signed)
Pt's states no medical or surgical changes since previsit or office visit.  VS CW  

## 2022-01-14 NOTE — Patient Instructions (Signed)
Handout on diverticulosis given.   YOU HAD AN ENDOSCOPIC PROCEDURE TODAY AT THE [facility]:   Refer to the procedure report that was given to you for any specific questions about what was found during the examination.  If the procedure report does not answer your questions, please call your gastroenterologist to clarify.  If you requested that your care partner not be given the details of your procedure findings, then the procedure report has been included in a sealed envelope for you to review at your convenience later.  YOU SHOULD EXPECT: Some feelings of bloating in the abdomen. Passage of more gas than usual.  Walking can help get rid of the air that was put into your GI tract during the procedure and reduce the bloating. If you had a lower endoscopy (such as a colonoscopy or flexible sigmoidoscopy) you may notice spotting of blood in your stool or on the toilet paper. If you underwent a bowel prep for your procedure, you may not have a normal bowel movement for a few days.  Please Note:  You might notice some irritation and congestion in your nose or some drainage.  This is from the oxygen used during your procedure.  There is no need for concern and it should clear up in a day or so.  SYMPTOMS TO REPORT IMMEDIATELY:   Following lower endoscopy (colonoscopy or flexible sigmoidoscopy):  Excessive amounts of blood in the stool  Significant tenderness or worsening of abdominal pains  Swelling of the abdomen that is new, acute  Fever of 100F or higher   For urgent or emergent issues, a gastroenterologist can be reached at any hour by calling [phone]. Do not use MyChart messaging for urgent concerns.    DIET:  We do recommend a small meal at first, but then you may proceed to your regular diet.  Drink plenty of fluids but you should avoid alcoholic beverages for 24 hours.  ACTIVITY:  You should plan to take it easy for the rest of today and you should NOT DRIVE or use  heavy machinery until tomorrow (because of the sedation medicines used during the test).    FOLLOW UP: Our staff will call the number listed on your records 48-72 hours following your procedure to check on you and address any questions or concerns that you may have regarding the information given to you following your procedure. If we do not reach you, we will leave a message.  We will attempt to reach you two times.  During this call, we will ask if you have developed any symptoms of COVID 19. If you develop any symptoms (ie: fever, flu-like symptoms, shortness of breath, cough etc.) before then, please call [phone].  If you test positive for Covid 19 in the 2 weeks post procedure, please call and report this information to us.    If any biopsies were taken you will be contacted by phone or by letter within the next 1-3 weeks.  Please call us at [phone] if you have not heard about the biopsies in 3 weeks.    SIGNATURES/CONFIDENTIALITY: You and/or your care partner have signed paperwork which will be entered into your electronic medical record.  These signatures attest to the fact that that the information above on your After Visit Summary has been reviewed and is understood.  Full responsibility of the confidentiality of this discharge information lies with you and/or your care-partner. 

## 2022-01-14 NOTE — Progress Notes (Signed)
Called to room to assist during endoscopic procedure.  Patient ID and intended procedure confirmed with present staff. Received instructions for my participation in the procedure from the performing physician.  

## 2022-01-16 ENCOUNTER — Telehealth: Payer: Self-pay

## 2022-01-16 NOTE — Telephone Encounter (Signed)
°  Follow up Call-  Call back number 01/14/2022  Post procedure Call Back phone  # [phone]  Permission to leave phone message Yes  Some recent data might be hidden     Patient questions:  Do you have a fever, pain , or abdominal swelling? No. Pain Score  0 *  Have you tolerated food without any problems? Yes.    Have you been able to return to your normal activities? Yes.    Do you have any questions about your discharge instructions: Diet   No. Medications  No. Follow up visit  No.  Do you have questions or concerns about your Care? No.  Actions: * If pain score is 4 or above: No action needed, pain <4.  Have you developed a fever since your procedure? no  2.   Have you had an respiratory symptoms (SOB or cough) since your procedure? no  3.   Have you tested positive for COVID 19 since your procedure no  4.   Have you had any family members/close contacts diagnosed with the COVID 19 since your procedure?  no   If yes to any of these questions please route to Joylene John, RN and Joella Prince, RN

## 2022-01-17 ENCOUNTER — Encounter: Payer: Self-pay | Admitting: Internal Medicine

## 2022-01-17 ENCOUNTER — Other Ambulatory Visit: Payer: Self-pay | Admitting: Family Medicine

## 2022-01-17 DIAGNOSIS — K219 Gastro-esophageal reflux disease without esophagitis: Secondary | ICD-10-CM

## 2022-02-21 ENCOUNTER — Emergency Department (INDEPENDENT_AMBULATORY_CARE_PROVIDER_SITE_OTHER)
Admission: EM | Admit: 2022-02-21 | Discharge: 2022-02-21 | Disposition: A | Discharge status: Home / Self Care | Payer: Medicare Other | Source: Home / Self Care

## 2022-02-21 ENCOUNTER — Other Ambulatory Visit: Payer: Self-pay

## 2022-02-21 DIAGNOSIS — J0100 Acute maxillary sinusitis, unspecified: Secondary | ICD-10-CM | POA: Diagnosis not present

## 2022-02-21 DIAGNOSIS — J3489 Other specified disorders of nose and nasal sinuses: Secondary | ICD-10-CM | POA: Diagnosis not present

## 2022-02-21 DIAGNOSIS — R059 Cough, unspecified: Secondary | ICD-10-CM

## 2022-02-21 MED ORDER — AMOXICILLIN-POT CLAVULANATE 875-125 MG PO TABS: 1.0000 | ORAL_TABLET | Freq: Two times a day (BID) | ORAL | 0 refills | Status: DC

## 2022-02-21 MED ORDER — BENZONATATE 200 MG PO CAPS: 200.0000 mg | ORAL_CAPSULE | Freq: Three times a day (TID) | ORAL | 0 refills | Status: AC | PRN

## 2022-02-21 MED ORDER — PREDNISONE 20 MG PO TABS: ORAL_TABLET | ORAL | 0 refills | Status: DC

## 2022-02-21 NOTE — ED Provider Notes (Signed)
[name]    CSN: [id number] Arrival date & time: 02/21/22  0904      History   Chief Complaint Chief Complaint  Patient presents with   Headache   Nasal Congestion   Cough    HPI Zachary [name] is a 71 y.o. male.   HPI 71 year old male presents with cough, sinus pressure (8/10), congestion, and headache for 2-3 days.  PMH significant for HTN and stroke.  Past Medical History:  Diagnosis Date   Arthritis    Articular cartilage disease    left shoulder   Diverticulitis    Dyslipidemia (high LDL; low HDL) 08/15/2016   GERD (gastroesophageal reflux disease)    Hearing problem    hearing deficit   Hemorrhoids    History of back surgery    Hypertension    Hypothyroidism    Prostate cancer ([place]) 2015   treated and in remission   Sleep apnea    uses a cpap   Stroke ([place])    TIA -affected balance and went through PT.   Wears glasses    Wears hearing aid    both ears    Patient Active Problem List   Diagnosis Date Noted   Hearing aid worn 02/28/2021   Coronary artery disease involving autologous artery coronary bypass graft with angina pectoris ([place]) 01/22/2021   Chest pain 01/15/2021   Prediabetes 05/27/2018   Stomatitis 04/17/2017   Dyslipidemia (high LDL; low HDL) 08/15/2016   S/P arthroscopy of shoulder 05/31/2016   History of prostate cancer 05/09/2016   Complete rotator cuff tear of left shoulder 07/05/2015   Tennis elbow 05/30/2014   Rotator cuff (capsule) sprain 03/10/2014   Right shoulder pain 02/11/2014   Memory difficulty 12/18/2012   Gynecomastia, male 07/30/2012   OSA (obstructive sleep apnea) 04/23/2011   EXTERNAL HEMORRHOIDS 02/12/2011   Prostate cancer ([place]) 11/12/2010   PLANTAR FASCIITIS 04/09/2010   Hypothyroidism 12/08/2009   NEOPLASM OF UNCERTAIN BEHAVIOR OF SKIN 03/10/2009   OSTEOARTHRITIS 02/14/2009   HEARING DEFICIT 02/08/2009   Essential hypertension 02/08/2009   Allergic rhinitis 02/08/2009   GERD 02/08/2009    PROTEINURIA 02/08/2009   DIVERTICULITIS, HX OF 02/08/2009    Past Surgical History:  Procedure Laterality Date   BACK SURGERY  1978   herniated disksurgery   CARDIAC CATHETERIZATION  08/01/2010   COLONOSCOPY     EXAM UNDER ANESTHESIA WITH MANIPULATION OF SHOULDER Right 09/15/2014   Procedure: RIGHT SHOULDER MANIPULATION UNDER ANESTHESIA;  Surgeon: Ninetta Lights, MD;  Location: [facility];  Service: Orthopedics;  Laterality: Right;   HEMORRHOID SURGERY  08/2006   LEFT HEART CATH AND CORONARY ANGIOGRAPHY N/A 01/16/2021   Procedure: LEFT HEART CATH AND CORONARY ANGIOGRAPHY;  Surgeon: Troy Sine, MD;  Location: [facility];  Service: Cardiovascular;  Laterality: N/A;   SHOULDER ARTHROSCOPY WITH DISTAL CLAVICLE RESECTION Left 07/07/2015   Procedure: SHOULDER ARTHROSCOPY WITH DISTAL CLAVICLE RESECTION;  Surgeon: Kathryne Hitch, MD;  Location: [facility];  Service: Orthopedics;  Laterality: Left;   SHOULDER ARTHROSCOPY WITH ROTATOR CUFF REPAIR Left 12/07/2015   Procedure: LEFT SHOULDER ARTHROSCOPY DEBRIDEMENT, WITH ROTATOR CUFF REPAIR;  Surgeon: Ninetta Lights, MD;  Location: [place];  Service: Orthopedics;  Laterality: Left;   SHOULDER ARTHROSCOPY WITH ROTATOR CUFF REPAIR AND SUBACROMIAL DECOMPRESSION Left 07/07/2015   Procedure: LEFT SHOULDER SCOPE DEBRIDEMENT, SUBACROMIAL DECOMPRESSION, DISTAL CLAVICULECTOMY, ROTATOR CUFF REPAIR  ;  Surgeon: Kathryne Hitch, MD;  Location: [facility];  Service: Orthopedics;  Laterality: Left;  ANESTHESIA: GENERAL, PRE/POST OP SCALENE   SHOULDER ARTHROSCOPY WITH SUBACROMIAL DECOMPRESSION, ROTATOR CUFF REPAIR AND BICEP TENDON REPAIR Right 03/10/2014   Procedure: RIGHT SHOULDER ARTHROSCOPY WITH SUBACROMIAL DECOMPRESSION, PARTIAL ACROMIOPLASTY WITH CORACOAROMIAL LABRUM DEBRIDEMENT RELEASE DISTAL CLAVICULECTOMY,  ROTATOR CUFF REPAIR AND EXTENSIVE DEBRIDEMENT;  Surgeon: Ninetta Lights, MD;  Location:  [place] ;  Service: Orthopedics;  Laterality: Right;   TENDON REPAIR  June 06, 2011   right elbow, Dr. Percell Miller       Home Medications    Prior to Admission medications   Medication Sig Start Date End Date Taking? Authorizing Provider  amoxicillin-clavulanate (AUGMENTIN) 875-125 MG tablet Take 1 tablet by mouth every 12 (twelve) hours. 02/21/22  Yes Eliezer Lofts, FNP  benzonatate (TESSALON) 200 MG capsule Take 1 capsule (200 mg total) by mouth 3 (three) times daily as needed for up to 7 days for cough. 02/21/22 02/28/22 Yes Eliezer Lofts, FNP  predniSONE (DELTASONE) 20 MG tablet Take 3 tabs PO daily x 5 days. 02/21/22  Yes Eliezer Lofts, FNP  aspirin EC 81 MG EC tablet Take 81 mg by mouth daily.    [provider]  dicyclomine (BENTYL) 10 MG capsule Take 1 capsule (10 mg total) by mouth 3 (three) times daily. 11/19/21   Noralyn Pick, NP  Ibuprofen (MOTRIN PO) Take by mouth.    [provider]  levothyroxine (SYNTHROID) 100 MCG tablet Take 1 tablet by mouth once daily 12/27/21   Nani Ravens, Crosby Oyster, DO  lisinopril (ZESTRIL) 5 MG tablet Take 1 tablet by mouth once daily 01/03/22   Nani Ravens, Crosby Oyster, DO  loratadine (CLARITIN) 10 MG tablet Take 10 mg by mouth daily.    [provider]  Melatonin 10 MG CAPS Take by mouth. Takes 5 mg daily    [provider]  nitroGLYCERIN (NITROSTAT) 0.4 MG SL tablet Place 1 tablet (0.4 mg total) under the tongue every 5 (five) minutes as needed for chest pain. 02/02/21 01/01/22  Kathyrn Drown D, NP  omeprazole (PRILOSEC) 20 MG capsule Take 1 capsule by mouth once daily 01/18/22   [name], DO  pravastatin (PRAVACHOL) 80 MG tablet TAKE 1 TABLET BY MOUTH ONCE DAILY AT  6  PM 12/14/21   Wendling, Crosby Oyster, DO  saccharomyces boulardii (FLORASTOR) 250 MG capsule Take 250 mg by mouth 2 (two) times daily.    [provider]  sodium bicarbonate 650 MG tablet Take 1 tablet by  mouth once daily 01/18/22   Wendling, Crosby Oyster, DO    Family History Family History  Problem Relation Age of Onset   Colon polyps Mother    Breast cancer Mother    Heart disease Father 37   Cancer Brother    Alcohol abuse Other    Arthritis Other    Cancer Other        Breast, Prostate   Coronary artery disease Other    Irritable bowel syndrome Other    Cystic fibrosis Other    Colon cancer Neg Hx    Esophageal cancer Neg Hx    Stomach cancer Neg Hx    Rectal cancer Neg Hx     Social History Social History   Tobacco Use   Smoking status: Never   Smokeless tobacco: Never  Vaping Use   Vaping Use: Never used  Substance Use Topics   Alcohol use: Yes    Comment: occasional   Drug use: No     Allergies  Oxycodone, Atorvastatin, and Simvastatin   Review of Systems Review of Systems  HENT:  Positive for congestion, sinus pressure and sinus pain.   Respiratory:  Positive for cough.   Neurological:  Positive for headaches.  All other systems reviewed and are negative.   Physical Exam Triage Vital Signs ED Triage Vitals [02/21/22 0920]  Enc Vitals Group     BP 124/84     Pulse Rate 63     Resp 18     Temp 97.9 F (36.6 C)     Temp Source Oral     SpO2 97 %     Weight      Height      Head Circumference      Peak Flow      Pain Score 8     Pain Loc      Pain Edu?      Excl. in [place]?    No data found.  Updated Vital Signs BP 124/84 (BP Location: Left Arm)    Pulse 63    Temp 97.9 F (36.6 C) (Oral)    Resp 18    SpO2 97%    Physical Exam Vitals and nursing note reviewed.  Constitutional:      General: He is not in acute distress.    Appearance: Normal appearance. He is normal weight. He is not ill-appearing.  HENT:     Head: Normocephalic and atraumatic.     Right Ear: Tympanic membrane and external ear normal.     Left Ear: Tympanic membrane and external ear normal.     Ears:     Comments: Moderate eustachian tube dysfunction noted  bilaterally    Nose:     Right Sinus: Maxillary sinus tenderness present.     Left Sinus: Maxillary sinus tenderness present.     Comments: Turbinates are erythematous/edematous    Mouth/Throat:     Mouth: Mucous membranes are moist.     Pharynx: Oropharynx is clear.  Eyes:     Extraocular Movements: Extraocular movements intact.     Conjunctiva/sclera: Conjunctivae normal.     Pupils: Pupils are equal, round, and reactive to light.  Cardiovascular:     Rate and Rhythm: Normal rate and regular rhythm.     Pulses: Normal pulses.     Heart sounds: Normal heart sounds.  Pulmonary:     Effort: Pulmonary effort is normal.     Breath sounds: Normal breath sounds.  Musculoskeletal:     Cervical back: Normal range of motion and neck supple.  Skin:    General: Skin is warm and dry.  Neurological:     General: No focal deficit present.     Mental Status: He is alert and oriented to person, place, and time.     UC Treatments / Results  Labs (all labs ordered are listed, but only abnormal results are displayed) Labs Reviewed - No data to display  EKG   Radiology No results found.  Procedures Procedures (including critical care time)  Medications Ordered in UC Medications - No data to display  Initial Impression / Assessment and Plan / UC Course  I have reviewed the triage vital signs and the nursing notes.  Pertinent labs & imaging results that were available during my care of the patient were reviewed by me and considered in my medical decision making (see chart for details).     MDM: 1.  Subacute maxillary sinusitis-Rx'd Augmentin; 2.  Sinus pressure-Rx'd Prednisone; 3.  Cough-Rx'd Tessalon Perles.  Advised patient to take medication as directed with food to completion.  Advised patient to take prednisone with first dose of Augmentin for the next 5 of 7 days.  Advised may use Tessalon Perles daily or as needed for for cough.  Encouraged increase daily water intake while  taking these medications.  Patient discharged home, hemodynamically stable. Final Clinical Impressions(s) / UC Diagnoses   Final diagnoses:  Subacute maxillary sinusitis  Sinus pressure  Cough, unspecified type     Discharge Instructions      Advised patient to take medication as directed with food to completion.  Advised patient to take prednisone with first dose of Augmentin for the next 5 of 7 days.  Advised may use Tessalon Perles daily or as needed for for cough.  Encouraged increase daily water intake while taking these medications.     ED Prescriptions     Medication Sig Dispense Auth. Provider   amoxicillin-clavulanate (AUGMENTIN) 875-125 MG tablet Take 1 tablet by mouth every 12 (twelve) hours. 14 tablet Eliezer Lofts, FNP   predniSONE (DELTASONE) 20 MG tablet Take 3 tabs PO daily x 5 days. 15 tablet Eliezer Lofts, FNP   benzonatate (TESSALON) 200 MG capsule Take 1 capsule (200 mg total) by mouth 3 (three) times daily as needed for up to 7 days for cough. 40 capsule Eliezer Lofts, FNP      PDMP not reviewed this encounter.   Eliezer Lofts, [place] 02/21/22 [phone]

## 2022-02-21 NOTE — Discharge Instructions (Addendum)
Advised patient to take medication as directed with food to completion.  Advised patient to take prednisone with first dose of Augmentin for the next 5 of 7 days.  Advised may use Tessalon Perles daily or as needed for for cough.  Encouraged increase daily water intake while taking these medications. ?

## 2022-02-21 NOTE — ED Triage Notes (Signed)
Pt c/o cough, congestion and headache x 2 days. Sinus pressure. Pain 8/10 Denies fever. Covid vaxed and boosted. Hx of sinus infections.  ? ?Daughter was recently sick for 2 weeks with similar sxs.  ?

## 2022-03-14 ENCOUNTER — Other Ambulatory Visit: Payer: Self-pay | Admitting: Family Medicine

## 2022-03-28 ENCOUNTER — Other Ambulatory Visit: Payer: Self-pay | Admitting: Family Medicine

## 2022-04-04 ENCOUNTER — Other Ambulatory Visit: Payer: Self-pay | Admitting: Family Medicine

## 2022-04-17 ENCOUNTER — Other Ambulatory Visit: Payer: Self-pay | Admitting: Family Medicine

## 2022-04-17 DIAGNOSIS — K219 Gastro-esophageal reflux disease without esophagitis: Secondary | ICD-10-CM

## 2022-05-17 ENCOUNTER — Ambulatory Visit (INDEPENDENT_AMBULATORY_CARE_PROVIDER_SITE_OTHER): Payer: Medicare Other | Admitting: Family Medicine

## 2022-05-17 ENCOUNTER — Encounter: Payer: Self-pay | Admitting: Family Medicine

## 2022-05-17 VITALS — BP 118/68 | HR 65 | Temp 97.7°F | Ht 68.0 in | Wt 211.5 lb

## 2022-05-17 DIAGNOSIS — L600 Ingrowing nail: Secondary | ICD-10-CM | POA: Diagnosis not present

## 2022-05-17 MED ORDER — TRAMADOL HCL 50 MG PO TABS: 50.0000 mg | ORAL_TABLET | Freq: Three times a day (TID) | ORAL | 0 refills | Status: AC | PRN

## 2022-05-17 NOTE — Progress Notes (Signed)
Chief Complaint  Patient presents with   Nail Problem    Ingrown toenail.  Left big toe    Zachary [name] is a 71 y.o. male here for a toenail complaint.  Duration: 1 week Location: L great toe Pruritic? No Painful? Yes Drainage? No Denies injury.  Other associated symptoms: No fevers, drainage, redness Therapies tried thus far: none  Past Medical History:  Diagnosis Date   Arthritis    Articular cartilage disease    left shoulder   Diverticulitis    Dyslipidemia (high LDL; low HDL) 08/15/2016   GERD (gastroesophageal reflux disease)    Hearing problem    hearing deficit   Hemorrhoids    History of back surgery    Hypertension    Hypothyroidism    Prostate cancer ([place]) 2015   treated and in remission   Sleep apnea    uses a cpap   Stroke ([place])    TIA -affected balance and went through PT.   Wears glasses    Wears hearing aid    both ears    BP 118/68   Pulse 65   Temp 97.7 F (36.5 C) (Oral)   Ht '5\' 8"'$  (1.727 m)   Wt 211 lb 8 oz (95.9 kg)   SpO2 97%   BMI 32.16 kg/m  Gen: awake, alert, appearing stated age Lungs: No accessory muscle use Skin: dystrophic great toenail on L with jagged medial border. There is ttp w pressure over the medial portion of nail plate and soft tissue medially. No drainage, erythema, fluctuance, excoriation Psych: Age appropriate judgment and insight  Procedure note; complete toenail removal Informed consent obtained. The area was anesthetized with a four corner ring block using 1.5 mL of 2% lidocaine without epinephrine in each quadrant, a total of 6 mL. A tourniquet was placed around the base of the toe. Several minutes past while the initial anesthesia started to work. A small spatula was used to elevate the nail bed and disrupt the matrix. A straight hemostat was then use to wrest the nail from place. Phenol was used at the origin of the nail and held in place for 30 seconds.  Adequate hemostasis was obtained and the  tourniquet was removed. A dressing was applied. The patient tolerated the procedure well. There were no complications noted.  Ingrown toenail of left foot - Plan: traMADol (ULTRAM) 50 MG tablet, PR REMOVAL OF NAIL PLATE  Betadine soaks rec'd bid at least, Tylenol, tramadol prn. Warnings about tramadol. F/u in 1 week to reck toe. He has had this before. Phenol this time, this toe has given him issues for many years before establishing here. If this returns or regrows, will consider referral to podiatry.  The patient voiced understanding and agreement to the plan.  [place], DO 05/17/22 7:52 AM

## 2022-05-17 NOTE — Patient Instructions (Addendum)
Do not shower for the rest of the day. When you do wash it, use only soap and water. Do not vigorously scrub. Keep the area clean and dry.   Things to look out for: increasing pain not relieved by ibuprofen/acetaminophen, fevers, spreading redness, drainage of pus, or foul odor.  Betadine or iodine soaks twice daily for 10-15 minutes at a time. This is very important!  Do not drink alcohol, do any illicit/street drugs, drive or do anything that requires alertness while on this medicine.   OK to take Tylenol 1000 mg (2 extra strength tabs) or 975 mg (3 regular strength tabs) every 6 hours as needed.  Let us know if you need anything.

## 2022-05-23 ENCOUNTER — Ambulatory Visit (INDEPENDENT_AMBULATORY_CARE_PROVIDER_SITE_OTHER): Payer: Medicare Other | Admitting: Family Medicine

## 2022-05-23 ENCOUNTER — Encounter: Payer: Self-pay | Admitting: Family Medicine

## 2022-05-23 DIAGNOSIS — Z09 Encounter for follow-up examination after completed treatment for conditions other than malignant neoplasm: Secondary | ICD-10-CM

## 2022-05-23 NOTE — Patient Instructions (Addendum)
Continue soaks for another week.  Neosporin/triple antibiotic ointment twice daily.   Ice/cold pack over area for 10-15 min twice daily.  Let us know if you need anything.

## 2022-05-23 NOTE — Progress Notes (Signed)
CC: F/u S-Patient had complete nail removal with phenol placement.  Reports he is having pain.  Compliant with Betadine soaks.  No longer taking narcotic.  Denies any fevers or drainage.  There is no redness.  O- No periungual soft tissue erythema or fluctuance, wound of nailbed healing nicely.  Mild TTP.  Gait is normal.  A- ingrown toenail status post removal  P-healing well overall, triple antibiotic ointment twice daily for the next week, continue Betadine soaks for the next week, continue ibuprofen/Tylenol, ice.  He will let me know if anything changes.  The patient voiced understanding and agreement to the plan.  Crosby Oyster Lorine Iannaccone 7:47 AM 05/23/22

## 2022-06-06 ENCOUNTER — Other Ambulatory Visit: Payer: Self-pay | Admitting: Family Medicine

## 2022-06-18 ENCOUNTER — Other Ambulatory Visit: Payer: Self-pay | Admitting: Family Medicine

## 2022-06-26 ENCOUNTER — Other Ambulatory Visit: Payer: Self-pay | Admitting: Family Medicine

## 2022-07-11 ENCOUNTER — Other Ambulatory Visit: Payer: Self-pay | Admitting: Family Medicine

## 2022-07-11 DIAGNOSIS — K219 Gastro-esophageal reflux disease without esophagitis: Secondary | ICD-10-CM

## 2022-07-13 ENCOUNTER — Other Ambulatory Visit: Payer: Self-pay | Admitting: Family Medicine

## 2022-09-02 ENCOUNTER — Ambulatory Visit (INDEPENDENT_AMBULATORY_CARE_PROVIDER_SITE_OTHER): Payer: Medicare Other | Admitting: *Deleted

## 2022-09-02 DIAGNOSIS — Z0000 Encounter for general adult medical examination without abnormal findings: Secondary | ICD-10-CM | POA: Diagnosis not present

## 2022-09-02 NOTE — Progress Notes (Signed)
Subjective:   Zachary [name] is a 71 y.o. male who presents for Medicare Annual/Subsequent preventive examination.  I connected with  Zachary [name] on 09/02/22 by a audio enabled telemedicine application and verified that I am speaking with the correct person using two identifiers.  Patient Location: Home  Provider Location: Office/Clinic  I discussed the limitations of evaluation and management by telemedicine. The patient expressed understanding and agreed to proceed.   Review of Systems    Defer to PCP Cardiac Risk Factors include: advanced age (>42mn, >>82women);dyslipidemia;hypertension     Objective:    There were no vitals filed for this visit. There is no height or weight on file to calculate BMI.     09/02/2022    8:21 AM 08/29/2021    7:44 AM 08/10/2021    8:27 AM 01/16/2021   10:20 AM 01/15/2021    4:17 PM 12/07/2020    2:53 PM 09/01/2018    1:08 PM  Advanced Directives  Does Patient Have a Medical Advance Directive? Yes Yes No No Yes No Yes  Type of [place]of [place] will   Living will;Healthcare Power of [place]  Does patient want to make changes to medical advance directive? No - Patient declined      No - Patient declined  Copy of [place] Chart? Yes - validated most recent copy scanned in chart (See row information) No - copy requested     No - copy requested  Would patient like information on creating a medical advance directive?   No - Patient declined Yes (Inpatient - patient defers creating a medical advance directive at this time - Information given)  No - Patient declined     Current Medications (verified) Outpatient Encounter Medications as of 09/02/2022  Medication Sig   aspirin EC 81 MG EC tablet Take 81 mg by mouth daily.   levothyroxine (SYNTHROID) 100 MCG tablet Take 1 tablet by mouth once daily    lisinopril (ZESTRIL) 5 MG tablet Take 1 tablet by mouth once daily   loratadine (CLARITIN) 10 MG tablet Take 10 mg by mouth daily.   Melatonin 10 MG CAPS Take by mouth. Takes 5 mg daily   omeprazole (PRILOSEC) 20 MG capsule Take 1 capsule by mouth once daily   pravastatin (PRAVACHOL) 80 MG tablet Take 1 tablet by mouth once daily   saccharomyces boulardii (FLORASTOR) 250 MG capsule Take 250 mg by mouth 2 (two) times daily.   sodium bicarbonate 650 MG tablet Take 1 tablet by mouth once daily   [DISCONTINUED] dicyclomine (BENTYL) 10 MG capsule Take 1 capsule (10 mg total) by mouth 3 (three) times daily.   [DISCONTINUED] Ibuprofen (MOTRIN PO) Take by mouth.   [DISCONTINUED] nitroGLYCERIN (NITROSTAT) 0.4 MG SL tablet Place 1 tablet (0.4 mg total) under the tongue every 5 (five) minutes as needed for chest pain.   No facility-administered encounter medications on file as of 09/02/2022.    Allergies (verified) Oxycodone, Atorvastatin, and Simvastatin   History: Past Medical History:  Diagnosis Date   Arthritis    Articular cartilage disease    left shoulder   Diverticulitis    Dyslipidemia (high LDL; low HDL) 08/15/2016   GERD (gastroesophageal reflux disease)    Hearing problem    hearing deficit   Hemorrhoids    History of back surgery    Hypertension    Hypothyroidism  Prostate cancer ([place]) 2015   treated and in remission   Sleep apnea    uses a cpap   Stroke ([place])    TIA -affected balance and went through PT.   Wears glasses    Wears hearing aid    both ears   Past Surgical History:  Procedure Laterality Date   BACK SURGERY  1978   herniated disksurgery   CARDIAC CATHETERIZATION  08/01/2010   COLONOSCOPY     EXAM UNDER ANESTHESIA WITH MANIPULATION OF SHOULDER Right 09/15/2014   Procedure: RIGHT SHOULDER MANIPULATION UNDER ANESTHESIA;  Surgeon: Ninetta Lights, MD;  Location: [facility];  Service: Orthopedics;  Laterality: Right;   HEMORRHOID SURGERY   08/2006   LEFT HEART CATH AND CORONARY ANGIOGRAPHY N/A 01/16/2021   Procedure: LEFT HEART CATH AND CORONARY ANGIOGRAPHY;  Surgeon: Troy Sine, MD;  Location: [facility];  Service: Cardiovascular;  Laterality: N/A;   SHOULDER ARTHROSCOPY WITH DISTAL CLAVICLE RESECTION Left 07/07/2015   Procedure: SHOULDER ARTHROSCOPY WITH DISTAL CLAVICLE RESECTION;  Surgeon: Kathryne Hitch, MD;  Location: [facility];  Service: Orthopedics;  Laterality: Left;   SHOULDER ARTHROSCOPY WITH ROTATOR CUFF REPAIR Left 12/07/2015   Procedure: LEFT SHOULDER ARTHROSCOPY DEBRIDEMENT, WITH ROTATOR CUFF REPAIR;  Surgeon: Ninetta Lights, MD;  Location: [facility];  Service: Orthopedics;  Laterality: Left;   SHOULDER ARTHROSCOPY WITH ROTATOR CUFF REPAIR AND SUBACROMIAL DECOMPRESSION Left 07/07/2015   Procedure: LEFT SHOULDER SCOPE DEBRIDEMENT, SUBACROMIAL DECOMPRESSION, DISTAL CLAVICULECTOMY, ROTATOR CUFF REPAIR  ;  Surgeon: Kathryne Hitch, MD;  Location: [facility];  Service: Orthopedics;  Laterality: Left;  ANESTHESIA: GENERAL, PRE/POST OP SCALENE   SHOULDER ARTHROSCOPY WITH SUBACROMIAL DECOMPRESSION, ROTATOR CUFF REPAIR AND BICEP TENDON REPAIR Right 03/10/2014   Procedure: RIGHT SHOULDER ARTHROSCOPY WITH SUBACROMIAL DECOMPRESSION, PARTIAL ACROMIOPLASTY WITH CORACOAROMIAL LABRUM DEBRIDEMENT RELEASE DISTAL CLAVICULECTOMY,  ROTATOR CUFF REPAIR AND EXTENSIVE DEBRIDEMENT;  Surgeon: Ninetta Lights, MD;  Location: [facility];  Service: Orthopedics;  Laterality: Right;   TENDON REPAIR  June 06, 2011   right elbow, Dr. Percell Miller   Family History  Problem Relation Age of Onset   Colon polyps Mother    Breast cancer Mother    Heart disease Father 21   Cancer Brother    Alcohol abuse Other    Arthritis Other    Cancer Other        Breast, Prostate   Coronary artery disease Other    Irritable bowel syndrome Other    Cystic fibrosis Other    Colon cancer Neg Hx     Esophageal cancer Neg Hx    Stomach cancer Neg Hx    Rectal cancer Neg Hx    Social History   Socioeconomic History   Marital status: Married    Spouse name: Zachary [name]   Number of children: 2   Years of education: Not on file   Highest education level: Not on file  Occupational History   Occupation: retired Presenter, 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, multimedia  Tobacco Use   Smoking status: Never   Smokeless tobacco: Never  Vaping Use   Vaping Use: Never used  Substance and Sexual Activity   Alcohol use: Yes    Comment: occasional   Drug use: No   Sexual activity: Not on file  Other Topics Concern   Not on file  Social History Narrative   Previously Chartered loss adjuster  [employer]   Social Determinants of Health   Financial Resource Strain: Low Risk  (08/29/2021)   Overall Financial Resource Strain (CARDIA)    Difficulty of Paying Living Expenses: Not hard at all  Food Insecurity: No Food Insecurity (08/29/2021)   Hunger Vital Sign    Worried About Running Out of Food in the Last Year: Never true    Ran Out of Food in the Last Year: Never true  Transportation Needs: No Transportation Needs (08/29/2021)   PRAPARE - Hydrologist (Medical): No    Lack of Transportation (Non-Medical): No  Physical Activity: Sufficiently Active (08/29/2021)   Exercise Vital Sign    Days of Exercise per Week: 7 days    Minutes of Exercise per Session: 30 min  Stress: No Stress Concern Present (08/29/2021)   [place]    Feeling of Stress : Not at all  Social Connections: Moderately Isolated (08/29/2021)   Social Connection and Isolation Panel [NHANES]    Frequency of Communication with Friends and Family: More than three times a week    Frequency of Social Gatherings with Friends and Family: More than three times a week    Attends Religious Services:  Never    Marine scientist or Organizations: No    Attends Music therapist: Never    Marital Status: Married    Tobacco Counseling Counseling given: Not Answered   Clinical Intake:  Pre-visit preparation completed: Yes  Pain : No/denies pain     Diabetes: No  How often do you need to have someone help you when you read instructions, pamphlets, or other written materials from your doctor or pharmacy?: 1 - Never   Activities of Daily Living    09/02/2022    8:23 AM  In your present state of health, do you have any difficulty performing the following activities:  Hearing? 1  Comment wears hearing aid  Vision? 0  Difficulty concentrating or making decisions? 0  Walking or climbing stairs? 0  Dressing or bathing? 0  Doing errands, shopping? 0  Preparing Food and eating ? N  Using the Toilet? N  In the past six months, have you accidently leaked urine? Y  Comment prior prostate surgery  Do you have problems with loss of bowel control? N  Managing your Medications? N  Managing your Finances? N  Housekeeping or managing your Housekeeping? N    Patient Care Team: [name], DO as PCP - General (Family Medicine) Sueanne Margarita, MD as PCP - Cardiology (Cardiology) Elizebeth Koller Mora Appl, MD (Inactive) (Internal Medicine) Franchot Gallo, MD as Consulting Physician (Urology)  Indicate any recent Medical Services you may have received from other than [facility] providers in the past year (date may be approximate).     Assessment:   This is a routine wellness examination for Phillip.  Hearing/Vision screen No results found.  Dietary issues and exercise activities discussed: Current Exercise Habits: Home exercise routine, Type of exercise: walking, Time (Minutes): 45, Frequency (Times/Week): 1, Weekly Exercise (Minutes/Week): 45, Intensity: Mild, Exercise limited by: Other - see comments   Goals Addressed             This Visit's Progress     Patient Stated   On track    Drink more water & continue walking       Depression Screen    09/02/2022    8:22 AM 08/29/2021    7:46  AM 08/17/2021    7:08 AM 09/27/2020    9:21 AM 01/26/2014    2:38 PM  PHQ 2/9 Scores  PHQ - 2 Score 0 0 0 0 0    Fall Risk    09/02/2022    8:22 AM 08/29/2021    7:45 AM 01/22/2021   11:26 AM 01/26/2014    2:38 PM  Fall Risk   Falls in the past year? 0 0 0 No  Number falls in past yr: 0 0 0   Injury with Fall? 0 0 0   Risk for fall due to : No Fall Risks     Follow up Falls evaluation completed Falls prevention discussed      FALL RISK PREVENTION PERTAINING TO THE HOME:  Any stairs in or around the home? Yes  If so, are there any without handrails? No  Home free of loose throw rugs in walkways, pet beds, electrical cords, etc? Yes  Adequate lighting in your home to reduce risk of falls? Yes   ASSISTIVE DEVICES UTILIZED TO PREVENT FALLS:  Life alert? No  Use of a cane, walker or w/c? No  Grab bars in the bathroom? Yes  Shower chair or bench in shower? No  Elevated toilet seat or a handicapped toilet? No    Cognitive Function:      11/01/2020    2:00 PM  Montreal Cognitive Assessment   Visuospatial/ Executive (0/5) 2  Naming (0/3) 3  Attention: Read list of digits (0/2) 1  Attention: Read list of letters (0/1) 1  Attention: Serial 7 subtraction starting at 100 (0/3) 3  Language: Repeat phrase (0/2) 0  Language : Fluency (0/1) 0  Abstraction (0/2) 0  Delayed Recall (0/5) 3  Orientation (0/6) 6  Total 19  Adjusted Score (based on education) 20      09/02/2022    8:27 AM  6CIT Screen  What Year? 0 points  What month? 0 points  What time? 0 points  Count back from 20 0 points  Months in reverse 0 points  Repeat phrase 0 points  Total Score 0 points    Immunizations Immunization History  Administered Date(s) Administered   Fluad Quad(high Dose 65+) 09/27/2020, 10/03/2021   Influenza Whole 11/06/2010, 10/24/2011, 09/22/2012    Influenza,inj,Quad PF,6+ Mos 09/07/2019   Influenza-Unspecified 09/22/2014, 09/23/2015, 09/17/2016, 09/09/2017   PFIZER Comirnaty(Gray Top)Covid-19 Tri-Sucrose Vaccine 04/05/2021   PFIZER(Purple Top)SARS-COV-2 Vaccination 01/14/2020, 02/03/2020, 09/15/2020   Pfizer Covid-19 Vaccine Bivalent Booster 5yr & up 09/13/2021   Pneumococcal Polysaccharide-23 01/14/2019, 04/14/2020   Td 10/17/2008   Tdap 01/07/2013   Zoster, Live 08/08/2015    TDAP status: Up to date  Flu Vaccine status: Due, Education has been provided regarding the importance of this vaccine. Advised may receive this vaccine at local pharmacy or Health Dept. Aware to provide a copy of the vaccination record if obtained from local pharmacy or Health Dept. Verbalized acceptance and understanding.  Pneumococcal vaccine status: Due, Education has been provided regarding the importance of this vaccine. Advised may receive this vaccine at local pharmacy or Health Dept. Aware to provide a copy of the vaccination record if obtained from local pharmacy or Health Dept. Verbalized acceptance and understanding.  Covid-19 vaccine status: Information provided on how to obtain vaccines.   Qualifies for Shingles Vaccine? Yes   Zostavax completed Yes   Shingrix Completed?: No.    Education has been provided regarding the importance of this vaccine. Patient has been advised to call insurance company to  determine out of pocket expense if they have not yet received this vaccine. Advised may also receive vaccine at local pharmacy or Health Dept. Verbalized acceptance and understanding.  Screening Tests Health Maintenance  Topic Date Due   Pneumonia Vaccine 4+ Years old (2 - PCV) 04/14/2021   COVID-19 Vaccine (6 - Pfizer risk series) 11/08/2021   INFLUENZA VACCINE  07/23/2022   Zoster Vaccines- Shingrix (1 of 2) 11/22/2022 (Originally 12/01/1970)   TETANUS/TDAP  01/07/2023   COLONOSCOPY (Pts 45-37yr Insurance coverage will need to be  confirmed)  01/15/2032   Hepatitis C Screening  Completed   HPV VACCINES  Aged Out    Health Maintenance  Health Maintenance Due  Topic Date Due   Pneumonia Vaccine 71 Years old (2 - PCV) 04/14/2021   COVID-19 Vaccine (6 - Pfizer risk series) 11/08/2021   INFLUENZA VACCINE  07/23/2022    Colorectal cancer screening: Type of screening: Colonoscopy. Completed 01/14/22. Repeat every 10 years  Lung Cancer Screening: (Low Dose CT Chest recommended if Age 69-80years, 30 pack-year currently smoking OR have quit w/in 15years.) does not qualify.   Lung Cancer Screening Referral: N/a  Additional Screening:  Hepatitis C Screening: does qualify; Completed 08/01/15  Vision Screening: Recommended annual ophthalmology exams for early detection of glaucoma and other disorders of the eye. Is the patient up to date with their annual eye exam?  Yes  Who is the provider or what is the name of the office in which the patient attends annual eye exams? [facility] If pt is not established with a provider, would they like to be referred to a provider to establish care? No .   Dental Screening: Recommended annual dental exams for proper oral hygiene  Community Resource Referral / Chronic Care Management: CRR required this visit?  No   CCM required this visit?  No      Plan:     I have personally reviewed and noted the following in the patient's chart:   Medical and social history Use of alcohol, tobacco or illicit drugs  Current medications and supplements including opioid prescriptions. Patient is not currently taking opioid prescriptions. Functional ability and status Nutritional status Physical activity Advanced directives List of other physicians Hospitalizations, surgeries, and ER visits in previous 12 months Vitals Screenings to include cognitive, depression, and falls Referrals and appointments  In addition, I have reviewed and discussed with patient certain preventive  protocols, quality metrics, and best practice recommendations. A written personalized care plan for preventive services as well as general preventive health recommendations were provided to patient.    Due to this being a telephonic visit, the after visit summary with patients personalized plan was offered to patient via mail or my-chart.  Patient would like to access on my-chart.   BBeatris Ship [place]  09/02/2022   Nurse Notes:  None

## 2022-09-02 NOTE — Patient Instructions (Signed)
[name] , Thank you for taking time to come for your Medicare Wellness Visit. I appreciate your ongoing commitment to your health goals. Please review the following plan we discussed and let me know if I can assist you in the future.   These are the goals we discussed:  Goals      Patient Stated     Drink more water & continue walking        This is a list of the screening recommended for you and due dates:  Health Maintenance  Topic Date Due   Pneumonia Vaccine (2 - PCV) 04/14/2021   COVID-19 Vaccine (6 - Pfizer risk series) 11/08/2021   Flu Shot  07/23/2022   Zoster (Shingles) Vaccine (1 of 2) 11/22/2022*   Tetanus Vaccine  01/07/2023   Colon Cancer Screening  01/15/2032   Hepatitis C Screening: USPSTF Recommendation to screen - Ages 18-79 yo.  Completed   HPV Vaccine  Aged Out  *Topic was postponed. The date shown is not the original due date.       Next appointment: Follow up in one year for your annual wellness visit.   Preventive Care 25 Years and Older, Male Preventive care refers to lifestyle choices and visits with your health care provider that can promote health and wellness. What does preventive care include? A yearly physical exam. This is also called an annual well check. Dental exams once or twice a year. Routine eye exams. Ask your health care provider how often you should have your eyes checked. Personal lifestyle choices, including: Daily care of your teeth and gums. Regular physical activity. Eating a healthy diet. Avoiding tobacco and drug use. Limiting alcohol use. Practicing safe sex. Taking low doses of aspirin every day. Taking vitamin and mineral supplements as recommended by your health care provider. What happens during an annual well check? The services and screenings done by your health care provider during your annual well check will depend on your age, overall health, lifestyle risk factors, and family history of  disease. Counseling  Your health care provider may ask you questions about your: Alcohol use. Tobacco use. Drug use. Emotional well-being. Home and relationship well-being. Sexual activity. Eating habits. History of falls. Memory and ability to understand (cognition). Work and work Statistician. Screening  You may have the following tests or measurements: Height, weight, and BMI. Blood pressure. Lipid and cholesterol levels. These may be checked every 5 years, or more frequently if you are over 57 years old. Skin check. Lung cancer screening. You may have this screening every year starting at age 40 if you have a 30-pack-year history of smoking and currently smoke or have quit within the past 15 years. Fecal occult blood test (FOBT) of the stool. You may have this test every year starting at age 21. Flexible sigmoidoscopy or colonoscopy. You may have a sigmoidoscopy every 5 years or a colonoscopy every 10 years starting at age 45. Prostate cancer screening. Recommendations will vary depending on your family history and other risks. Hepatitis C blood test. Hepatitis B blood test. Sexually transmitted disease (STD) testing. Diabetes screening. This is done by checking your blood sugar (glucose) after you have not eaten for a while (fasting). You may have this done every 1-3 years. Abdominal aortic aneurysm (AAA) screening. You may need this if you are a current or former smoker. Osteoporosis. You may be screened starting at age 70 if you are at high risk. Talk with your health care provider about your test  results, treatment options, and if necessary, the need for more tests. Vaccines  Your health care provider may recommend certain vaccines, such as: Influenza vaccine. This is recommended every year. Tetanus, diphtheria, and acellular pertussis (Tdap, Td) vaccine. You may need a Td booster every 10 years. Zoster vaccine. You may need this after age 69. Pneumococcal 13-valent  conjugate (PCV13) vaccine. One dose is recommended after age 35. Pneumococcal polysaccharide (PPSV23) vaccine. One dose is recommended after age 70. Talk to your health care provider about which screenings and vaccines you need and how often you need them. This information is not intended to replace advice given to you by your health care provider. Make sure you discuss any questions you have with your health care provider. Document Released: 01/05/2016 Document Revised: 08/28/2016 Document Reviewed: 10/10/2015 Elsevier Interactive Patient Education  2017 [place] Prevention in the Home Falls can cause injuries. They can happen to people of all ages. There are many things you can do to make your home safe and to help prevent falls. What can I do on the outside of my home? Regularly fix the edges of walkways and driveways and fix any cracks. Remove anything that might make you trip as you walk through a door, such as a raised step or threshold. Trim any bushes or trees on the path to your home. Use bright outdoor lighting. Clear any walking paths of anything that might make someone trip, such as rocks or tools. Regularly check to see if handrails are loose or broken. Make sure that both sides of any steps have handrails. Any raised decks and porches should have guardrails on the edges. Have any leaves, snow, or ice cleared regularly. Use sand or salt on walking paths during winter. Clean up any spills in your garage right away. This includes oil or grease spills. What can I do in the bathroom? Use night lights. Install grab bars by the toilet and in the tub and shower. Do not use towel bars as grab bars. Use non-skid mats or decals in the tub or shower. If you need to sit down in the shower, use a plastic, non-slip stool. Keep the floor dry. Clean up any water that spills on the floor as soon as it happens. Remove soap buildup in the tub or shower regularly. Attach bath mats  securely with double-sided non-slip rug tape. Do not have throw rugs and other things on the floor that can make you trip. What can I do in the bedroom? Use night lights. Make sure that you have a light by your bed that is easy to reach. Do not use any sheets or blankets that are too big for your bed. They should not hang down onto the floor. Have a firm chair that has side arms. You can use this for support while you get dressed. Do not have throw rugs and other things on the floor that can make you trip. What can I do in the kitchen? Clean up any spills right away. Avoid walking on wet floors. Keep items that you use a lot in easy-to-reach places. If you need to reach something above you, use a strong step stool that has a grab bar. Keep electrical cords out of the way. Do not use floor polish or wax that makes floors slippery. If you must use wax, use non-skid floor wax. Do not have throw rugs and other things on the floor that can make you trip. What can I do with my stairs?  Do not leave any items on the stairs. Make sure that there are handrails on both sides of the stairs and use them. Fix handrails that are broken or loose. Make sure that handrails are as long as the stairways. Check any carpeting to make sure that it is firmly attached to the stairs. Fix any carpet that is loose or worn. Avoid having throw rugs at the top or bottom of the stairs. If you do have throw rugs, attach them to the floor with carpet tape. Make sure that you have a light switch at the top of the stairs and the bottom of the stairs. If you do not have them, ask someone to add them for you. What else can I do to help prevent falls? Wear shoes that: Do not have high heels. Have rubber bottoms. Are comfortable and fit you well. Are closed at the toe. Do not wear sandals. If you use a stepladder: Make sure that it is fully opened. Do not climb a closed stepladder. Make sure that both sides of the stepladder  are locked into place. Ask someone to hold it for you, if possible. Clearly mark and make sure that you can see: Any grab bars or handrails. First and last steps. Where the edge of each step is. Use tools that help you move around (mobility aids) if they are needed. These include: Canes. Walkers. Scooters. Crutches. Turn on the lights when you go into a dark area. Replace any light bulbs as soon as they burn out. Set up your furniture so you have a clear path. Avoid moving your furniture around. If any of your floors are uneven, fix them. If there are any pets around you, be aware of where they are. Review your medicines with your doctor. Some medicines can make you feel dizzy. This can increase your chance of falling. Ask your doctor what other things that you can do to help prevent falls. This information is not intended to replace advice given to you by your health care provider. Make sure you discuss any questions you have with your health care provider. Document Released: 10/05/2009 Document Revised: 05/16/2016 Document Reviewed: 01/13/2015 Elsevier Interactive Patient Education  2017 [employer].

## 2022-09-13 ENCOUNTER — Other Ambulatory Visit: Payer: Self-pay | Admitting: Family Medicine

## 2022-09-15 ENCOUNTER — Other Ambulatory Visit: Payer: Self-pay | Admitting: Family Medicine

## 2022-09-29 ENCOUNTER — Other Ambulatory Visit: Payer: Self-pay | Admitting: Family Medicine

## 2022-09-29 DIAGNOSIS — K219 Gastro-esophageal reflux disease without esophagitis: Secondary | ICD-10-CM

## 2022-10-02 DIAGNOSIS — H2513 Age-related nuclear cataract, bilateral: Secondary | ICD-10-CM | POA: Diagnosis not present

## 2022-10-07 ENCOUNTER — Ambulatory Visit
Admission: EM | Admit: 2022-10-07 | Discharge: 2022-10-07 | Disposition: A | Discharge status: Home / Self Care | Payer: Medicare Other | Attending: Family Medicine | Admitting: Family Medicine

## 2022-10-07 ENCOUNTER — Encounter: Payer: Self-pay | Admitting: Emergency Medicine

## 2022-10-07 DIAGNOSIS — J3489 Other specified disorders of nose and nasal sinuses: Secondary | ICD-10-CM | POA: Diagnosis not present

## 2022-10-07 DIAGNOSIS — R059 Cough, unspecified: Secondary | ICD-10-CM | POA: Diagnosis not present

## 2022-10-07 DIAGNOSIS — J0100 Acute maxillary sinusitis, unspecified: Secondary | ICD-10-CM | POA: Diagnosis not present

## 2022-10-07 DIAGNOSIS — J309 Allergic rhinitis, unspecified: Secondary | ICD-10-CM

## 2022-10-07 MED ORDER — PREDNISONE 20 MG PO TABS: ORAL_TABLET | ORAL | 0 refills | Status: DC

## 2022-10-07 MED ORDER — AMOXICILLIN 875 MG PO TABS: 875.0000 mg | ORAL_TABLET | Freq: Two times a day (BID) | ORAL | 0 refills | Status: AC

## 2022-10-07 MED ORDER — FEXOFENADINE HCL 180 MG PO TABS: 180.0000 mg | ORAL_TABLET | Freq: Every day | ORAL | 0 refills | Status: DC

## 2022-10-07 MED ORDER — BENZONATATE 200 MG PO CAPS: 200.0000 mg | ORAL_CAPSULE | Freq: Three times a day (TID) | ORAL | 0 refills | Status: AC | PRN

## 2022-10-07 NOTE — ED Provider Notes (Signed)
[name]    CSN: [id number] Arrival date & time: 10/07/22  1319      History   Chief Complaint Chief Complaint  Patient presents with   Cough    HPI NOEH SPARACINO is a 71 y.o. male.   HPI patient complains of nonproductive cough, sinus pressure, nasal drainage x3 days.  Reports 1 bout of diarrhea this morning.  Patient reports has been taking OTC cold meds.  PMH significant for prostate cancer, stroke, and HTN.  Past Medical History:  Diagnosis Date   Arthritis    Articular cartilage disease    left shoulder   Diverticulitis    Dyslipidemia (high LDL; low HDL) 08/15/2016   GERD (gastroesophageal reflux disease)    Hearing problem    hearing deficit   Hemorrhoids    History of back surgery    Hypertension    Hypothyroidism    Prostate cancer ([place]) 2015   treated and in remission   Sleep apnea    uses a cpap   Stroke ([place])    TIA -affected balance and went through PT.   Wears glasses    Wears hearing aid    both ears    Patient Active Problem List   Diagnosis Date Noted   Hearing aid worn 02/28/2021   Coronary artery disease involving autologous artery coronary bypass graft with angina pectoris ([place]) 01/22/2021   Chest pain 01/15/2021   Prediabetes 05/27/2018   Stomatitis 04/17/2017   Dyslipidemia (high LDL; low HDL) 08/15/2016   S/P arthroscopy of shoulder 05/31/2016   History of prostate cancer 05/09/2016   Complete rotator cuff tear of left shoulder 07/05/2015   Tennis elbow 05/30/2014   Rotator cuff (capsule) sprain 03/10/2014   Right shoulder pain 02/11/2014   Memory difficulty 12/18/2012   Gynecomastia, male 07/30/2012   OSA (obstructive sleep apnea) 04/23/2011   EXTERNAL HEMORRHOIDS 02/12/2011   Prostate cancer ([place]) 11/12/2010   PLANTAR FASCIITIS 04/09/2010   Hypothyroidism 12/08/2009   NEOPLASM OF UNCERTAIN BEHAVIOR OF SKIN 03/10/2009   OSTEOARTHRITIS 02/14/2009   HEARING DEFICIT 02/08/2009   Essential hypertension  02/08/2009   Allergic rhinitis 02/08/2009   GERD 02/08/2009   PROTEINURIA 02/08/2009   DIVERTICULITIS, HX OF 02/08/2009    Past Surgical History:  Procedure Laterality Date   BACK SURGERY  1978   herniated disksurgery   CARDIAC CATHETERIZATION  08/01/2010   COLONOSCOPY     EXAM UNDER ANESTHESIA WITH MANIPULATION OF SHOULDER Right 09/15/2014   Procedure: RIGHT SHOULDER MANIPULATION UNDER ANESTHESIA;  Surgeon: Ninetta Lights, MD;  Location: [facility];  Service: Orthopedics;  Laterality: Right;   HEMORRHOID SURGERY  08/2006   LEFT HEART CATH AND CORONARY ANGIOGRAPHY N/A 01/16/2021   Procedure: LEFT HEART CATH AND CORONARY ANGIOGRAPHY;  Surgeon: Troy Sine, MD;  Location: [facility];  Service: Cardiovascular;  Laterality: N/A;   SHOULDER ARTHROSCOPY WITH DISTAL CLAVICLE RESECTION Left 07/07/2015   Procedure: SHOULDER ARTHROSCOPY WITH DISTAL CLAVICLE RESECTION;  Surgeon: Kathryne Hitch, MD;  Location: [facility];  Service: Orthopedics;  Laterality: Left;   SHOULDER ARTHROSCOPY WITH ROTATOR CUFF REPAIR Left 12/07/2015   Procedure: LEFT SHOULDER ARTHROSCOPY DEBRIDEMENT, WITH ROTATOR CUFF REPAIR;  Surgeon: Ninetta Lights, MD;  Location: [facility];  Service: Orthopedics;  Laterality: Left;   SHOULDER ARTHROSCOPY WITH ROTATOR CUFF REPAIR AND SUBACROMIAL DECOMPRESSION Left 07/07/2015   Procedure: LEFT SHOULDER SCOPE DEBRIDEMENT, SUBACROMIAL DECOMPRESSION, DISTAL CLAVICULECTOMY, ROTATOR CUFF REPAIR  ;  Surgeon: Quillian Quince  Percell Miller, MD;  Location: [facility];  Service: Orthopedics;  Laterality: Left;  ANESTHESIA: GENERAL, PRE/POST OP SCALENE   SHOULDER ARTHROSCOPY WITH SUBACROMIAL DECOMPRESSION, ROTATOR CUFF REPAIR AND BICEP TENDON REPAIR Right 03/10/2014   Procedure: RIGHT SHOULDER ARTHROSCOPY WITH SUBACROMIAL DECOMPRESSION, PARTIAL ACROMIOPLASTY WITH CORACOAROMIAL LABRUM DEBRIDEMENT RELEASE DISTAL CLAVICULECTOMY,  ROTATOR CUFF REPAIR AND  EXTENSIVE DEBRIDEMENT;  Surgeon: Ninetta Lights, MD;  Location: [facility];  Service: Orthopedics;  Laterality: Right;   TENDON REPAIR  June 06, 2011   right elbow, Dr. Percell Miller       Home Medications    Prior to Admission medications   Medication Sig Start Date End Date Taking? Authorizing Provider  amoxicillin (AMOXIL) 875 MG tablet Take 1 tablet (875 mg total) by mouth 2 (two) times daily for 7 days. 10/07/22 10/14/22 Yes Eliezer Lofts, FNP  aspirin EC 81 MG EC tablet Take 81 mg by mouth daily.   Yes [provider]  benzonatate (TESSALON) 200 MG capsule Take 1 capsule (200 mg total) by mouth 3 (three) times daily as needed for up to 7 days. 10/07/22 10/14/22 Yes Eliezer Lofts, FNP  fexofenadine [facility] ALLERGY) 180 MG tablet Take 1 tablet (180 mg total) by mouth daily for 15 days. 10/07/22 10/22/22 Yes Eliezer Lofts, FNP  levothyroxine (SYNTHROID) 100 MCG tablet Take 1 tablet by mouth once daily 09/13/22  Yes Wendling, Crosby Oyster, DO  lisinopril (ZESTRIL) 5 MG tablet Take 1 tablet by mouth once daily 09/16/22  Yes Wendling, Crosby Oyster, DO  loratadine (CLARITIN) 10 MG tablet Take 10 mg by mouth daily.   Yes [provider]  Melatonin 10 MG CAPS Take by mouth. Takes 5 mg daily   Yes [provider]  omeprazole (PRILOSEC) 20 MG capsule Take 1 capsule by mouth once daily 09/30/22  Yes Wendling, Crosby Oyster, DO  pravastatin (PRAVACHOL) 80 MG tablet Take 1 tablet by mouth once daily 06/06/22  Yes Wendling, Crosby Oyster, DO  predniSONE (DELTASONE) 20 MG tablet Take 3 tabs PO daily x 5 days. 10/07/22  Yes Eliezer Lofts, FNP  saccharomyces boulardii (FLORASTOR) 250 MG capsule Take 250 mg by mouth 2 (two) times daily.   Yes [provider]  sodium bicarbonate 650 MG tablet Take 1 tablet by mouth once daily 07/15/22  Yes Wendling, Crosby Oyster, DO    Family History Family History  Problem Relation Age of Onset   Colon polyps Mother     Breast cancer Mother    Heart disease Father 77   Cancer Brother    Alcohol abuse Other    Arthritis Other    Cancer Other        Breast, Prostate   Coronary artery disease Other    Irritable bowel syndrome Other    Cystic fibrosis Other    Colon cancer Neg Hx    Esophageal cancer Neg Hx    Stomach cancer Neg Hx    Rectal cancer Neg Hx     Social History Social History   Tobacco Use   Smoking status: Never   Smokeless tobacco: Never  Vaping Use   Vaping Use: Never used  Substance Use Topics   Alcohol use: Yes    Comment: occasional   Drug use: No     Allergies   Oxycodone, Atorvastatin, and Simvastatin   Review of Systems Review of Systems  HENT:  Positive for congestion and sinus pressure.   Respiratory:  Positive for cough.   Gastrointestinal:  Positive for diarrhea.  All other systems reviewed and are negative.    Physical Exam Triage Vital Signs ED Triage Vitals  Enc Vitals Group     BP 10/07/22 1415 120/79     Pulse Rate 10/07/22 1415 66     Resp 10/07/22 1415 18     Temp 10/07/22 1415 99.3 F (37.4 C)     Temp Source 10/07/22 1415 Oral     SpO2 10/07/22 1415 95 %     Weight 10/07/22 1417 207 lb (93.9 kg)     Height 10/07/22 1417 '5\' 8"'$  (1.727 m)     Head Circumference --      Peak Flow --      Pain Score 10/07/22 1416 0     Pain Loc --      Pain Edu? --      Excl. in [place]? --    No data found.  Updated Vital Signs BP 120/79 (BP Location: Left Arm)   Pulse 66   Temp 99.3 F (37.4 C) (Oral)   Resp 18   Ht '5\' 8"'$  (1.727 m)   Wt 207 lb (93.9 kg)   SpO2 95%   BMI 31.47 kg/m    Physical Exam Vitals and nursing note reviewed.  Constitutional:      Appearance: Normal appearance. He is normal weight.  HENT:     Head: Normocephalic and atraumatic.     Right Ear: Tympanic membrane and external ear normal.     Left Ear: Tympanic membrane and external ear normal.     Ears:     Comments: Moderate eustachian tube dysfunction noted  bilaterally    Nose:     Right Sinus: Maxillary sinus tenderness present.     Left Sinus: Maxillary sinus tenderness present.     Comments: Turbinates are erythematous/edematous    Mouth/Throat:     Mouth: Mucous membranes are moist.     Pharynx: Oropharynx is clear.     Comments: Significant amount of clear drainage of posterior oropharynx noted Eyes:     Extraocular Movements: Extraocular movements intact.     Conjunctiva/sclera: Conjunctivae normal.     Pupils: Pupils are equal, round, and reactive to light.  Cardiovascular:     Rate and Rhythm: Normal rate and regular rhythm.     Pulses: Normal pulses.     Heart sounds: Normal heart sounds. No murmur heard. Pulmonary:     Effort: Pulmonary effort is normal.     Breath sounds: Normal breath sounds. No wheezing, rhonchi or rales.     Comments: Infrequent nonproductive cough noted on exam Musculoskeletal:     Cervical back: Normal range of motion and neck supple.  Skin:    General: Skin is warm and dry.  Neurological:     General: No focal deficit present.     Mental Status: He is alert and oriented to person, place, and time.      UC Treatments / Results  Labs (all labs ordered are listed, but only abnormal results are displayed) Labs Reviewed - No data to display  EKG   Radiology No results found.  Procedures Procedures (including critical care time)  Medications Ordered in UC Medications - No data to display  Initial Impression / Assessment and Plan / UC Course  I have reviewed the triage vital signs and the nursing notes.  Pertinent labs & imaging results that were available during my care of the patient were reviewed by me and considered in my medical decision making (see chart for  details).     MDM: 1.  Acute non-recurrent maxillary sinusitis-Rx'd Amoxicillin; 2.  Sinus pressure-Rx'd prednisone; 3.  Allergic rhinitis-Rx'd Allegra; 4.  Cough-Rx'd Tessalon. Advised patient to take medication as directed  with food to completion.  Advised patient to take Allegra and prednisone with first dose of Amoxicillin for the next 5 of 7 days.  Advised may use Allegra as needed afterwards for concurrent postnasal drainage/drip.  Advised may use Tessalon Perles daily or as needed for cough.  Encouraged patient to increase daily water intake to 64 ounces per day while taking these medications.  Advised patient if symptoms worsen and/or unresolved please follow-up with PCP or here for further evaluation.  Patient discharged home, hemodynamically stable. Final Clinical Impressions(s) / UC Diagnoses   Final diagnoses:  Acute non-recurrent maxillary sinusitis  Sinus pressure  Allergic rhinitis, unspecified seasonality, unspecified trigger  Cough, unspecified type     Discharge Instructions      Advised patient to take medication as directed with food to completion.  Advised patient to take Allegra and prednisone with first dose of Amoxicillin for the next 5 of 7 days.  Advised may use Allegra as needed afterwards for concurrent postnasal drainage/drip.  Advised may use Tessalon Perles daily or as needed for cough.  Encouraged patient to increase daily water intake to 64 ounces per day while taking these medications.  Advised patient if symptoms worsen and/or unresolved please follow-up with PCP or here for further evaluation.     ED Prescriptions     Medication Sig Dispense Auth. Provider   amoxicillin (AMOXIL) 875 MG tablet Take 1 tablet (875 mg total) by mouth 2 (two) times daily for 7 days. 14 tablet Eliezer Lofts, FNP   predniSONE (DELTASONE) 20 MG tablet Take 3 tabs PO daily x 5 days. 15 tablet Eliezer Lofts, FNP   fexofenadine [facility] ALLERGY) 180 MG tablet Take 1 tablet (180 mg total) by mouth daily for 15 days. 15 tablet Eliezer Lofts, FNP   benzonatate (TESSALON) 200 MG capsule Take 1 capsule (200 mg total) by mouth 3 (three) times daily as needed for up to 7 days. 40 capsule Eliezer Lofts, FNP       PDMP not reviewed this encounter.   Eliezer Lofts, [place] 10/07/22 1526

## 2022-10-07 NOTE — Discharge Instructions (Addendum)
Advised patient to take medication as directed with food to completion.  Advised patient to take Allegra and prednisone with first dose of Amoxicillin for the next 5 of 7 days.  Advised may use Allegra as needed afterwards for concurrent postnasal drainage/drip.  Advised may use Tessalon Perles daily or as needed for cough.  Encouraged patient to increase daily water intake to 64 ounces per day while taking these medications.  Advised patient if symptoms worsen and/or unresolved please follow-up with PCP or here for further evaluation.

## 2022-10-07 NOTE — ED Triage Notes (Signed)
Patient c/o non-productive cough, sinus pressure, nasal drainage x 3 days.  Patient did have some diarrhea this morning.  Patient has been taken OTC cold meds.

## 2022-10-08 ENCOUNTER — Telehealth: Payer: Self-pay

## 2022-10-08 NOTE — Telephone Encounter (Signed)
Pt states he is feeling better since UC visit. Is taking meds as prescribed. Advised to call if any questions or concerns.

## 2022-10-13 ENCOUNTER — Other Ambulatory Visit: Payer: Self-pay | Admitting: Family Medicine

## 2022-10-21 ENCOUNTER — Other Ambulatory Visit: Payer: Self-pay | Admitting: Family Medicine

## 2022-10-21 ENCOUNTER — Encounter: Payer: Self-pay | Admitting: Family Medicine

## 2022-10-21 MED ORDER — DICYCLOMINE HCL 10 MG PO CAPS: ORAL_CAPSULE | ORAL | 0 refills | Status: DC

## 2022-11-13 ENCOUNTER — Ambulatory Visit (INDEPENDENT_AMBULATORY_CARE_PROVIDER_SITE_OTHER): Payer: Medicare Other | Admitting: Family Medicine

## 2022-11-13 ENCOUNTER — Ambulatory Visit: Payer: Medicare Other | Admitting: Family Medicine

## 2022-11-13 ENCOUNTER — Encounter: Payer: Self-pay | Admitting: Family Medicine

## 2022-11-13 VITALS — BP 110/70 | HR 99 | Temp 97.9°F | Resp 18 | Ht 68.0 in | Wt 208.4 lb

## 2022-11-13 DIAGNOSIS — L918 Other hypertrophic disorders of the skin: Secondary | ICD-10-CM

## 2022-11-13 DIAGNOSIS — L820 Inflamed seborrheic keratosis: Secondary | ICD-10-CM | POA: Diagnosis not present

## 2022-11-13 DIAGNOSIS — J309 Allergic rhinitis, unspecified: Secondary | ICD-10-CM | POA: Diagnosis not present

## 2022-11-13 DIAGNOSIS — D489 Neoplasm of uncertain behavior, unspecified: Secondary | ICD-10-CM

## 2022-11-13 MED ORDER — LEVOCETIRIZINE DIHYDROCHLORIDE 5 MG PO TABS: 5.0000 mg | ORAL_TABLET | Freq: Every evening | ORAL | 2 refills | Status: DC

## 2022-11-13 MED ORDER — MONTELUKAST SODIUM 10 MG PO TABS: 10.0000 mg | ORAL_TABLET | Freq: Every day | ORAL | 3 refills | Status: DC

## 2022-11-13 NOTE — Patient Instructions (Addendum)
Claritin (loratadine), Allegra (fexofenadine), Zyrtec (cetirizine) which is also equivalent to Xyzal (levocetirizine); these are listed in order from weakest to strongest. Generic, and therefore cheaper, options are in the parentheses.   Continue the Flonase.   There are available OTC, and the generic versions, which may be cheaper, are in parentheses. Show this to a pharmacist if you have trouble finding any of these items.  Do not shower for the rest of the day. When you do wash it, use only soap and water. Do not vigorously scrub. Apply triple antibiotic ointment (like Neosporin) twice daily. Keep the area clean and dry.   Things to look out for: increasing pain not relieved by ibuprofen/acetaminophen, fevers, spreading redness, drainage of pus, or foul odor.  Give [place] 1 week to get the results of your biopsy back.  Let us know if you need anything.

## 2022-11-13 NOTE — Progress Notes (Signed)
Chief Complaint  Patient presents with   Sinus Problem    X1 month. Pt went to UC on 10/08/2022 and was told he had a cold and a sinus infection. Sxs have improved but are worse at night.     Zachary [name] here for URI complaints.  Duration: 1 month  Associated symptoms: sinus congestion, rhinorrhea, coughing from drainage, and sinus pressure Denies: sinus pain, itchy watery eyes, ear pain, ear drainage, sore throat, wheezing, shortness of breath, myalgia, and fevers Treatment to date: Flonase Sick contacts: No  2 mo of a painful lesion behind ear. No itching or drainage. Mask strap seems to irritate it further. Has not tried anything at home thus far.   Past Medical History:  Diagnosis Date   Arthritis    Articular cartilage disease    left shoulder   Diverticulitis    Dyslipidemia (high LDL; low HDL) 08/15/2016   GERD (gastroesophageal reflux disease)    Hearing problem    hearing deficit   Hemorrhoids    History of back surgery    Hypertension    Hypothyroidism    Prostate cancer ([place]) 2015   treated and in remission   Sleep apnea    uses a cpap   Stroke ([place])    TIA -affected balance and went through PT.   Wears glasses    Wears hearing aid    both ears    Objective BP 110/70 (BP Location: Left Arm, Patient Position: Sitting, Cuff Size: Normal)   Pulse 99   Temp 97.9 F (36.6 C) (Oral)   Resp 18   Ht '5\' 8"'$  (1.727 m)   Wt 208 lb 6.4 oz (94.5 kg)   SpO2 95%   BMI 31.69 kg/m  General: Awake, alert, appears stated age HEENT: AT, [place], ears patent b/l and TM's neg, nares patent w/o discharge, pharynx pink and without exudates, MMM, no sinus ttp Skin: 0.6 x 0.4 cm scaly and raised lesion in the R posterior auricular region without surrounding erythema, fluctuance, drainage.  Tag of skin on left anterior neck/upper chest region that is slightly hyperpigmented, no TTP or fluctuance Neck: No masses or asymmetry Heart: RRR Lungs: CTAB, no accessory muscle  use Psych: Age appropriate judgment and insight, normal mood and affect   Post auricular region  Procedure note; shave biopsy-indication therapeutic and diagnostic Informed consent was obtained. The area was cleaned with alcohol and injected with 1 mL of 1% lidocaine with epinephrine. A Dermablade was slightly bent and used to cut under the area of interest. The specimen was placed in a sterile specimen cup and sent to the lab. The area was then cauterized ensuring adequate hemostasis. The area was dressed with triple antibiotic ointment and a bandage. There were no complications noted. The patient tolerated the procedure well.  Procedure note; skin tag removal-indication therapeutic Informed consent obtained. Alcohol was used to clean the area. It was then removed with iris scissors. Triple antibiotic ointment and a Band-Aid were placed. There were no complications noted. The patient tolerated the procedure well.   Allergic rhinitis, unspecified seasonality, unspecified trigger - Plan: levocetirizine (XYZAL) 5 MG tablet, montelukast (SINGULAIR) 10 MG tablet  Neoplasm of uncertain behavior - Plan: Surgical pathology( [place]), CANCELED: Surgical pathology( [place]/ [facility])  Acrochordon  Chronic, uncontrolled.  Continue Flonase.  Add Xyzal and Singulair 10 mg daily.  Follow-up in 4 weeks to recheck.  Could consider Astelin nasal spray versus referral to allergy.  He did not improve with  prednisone or antibiotics and has no pain over the sinuses.  I do not suspect an infection at this point 1 month out. Shave biopsy today for both therapeutic and diagnostic purposes.  Aftercare instructions verbalized and written down. Removal today. F/u prn. If starting to experience fevers, shaking, or shortness of breath, seek immediate care. Pt voiced understanding and agreement to the plan.  [name], DO 11/13/22 3:31 PM

## 2022-11-22 ENCOUNTER — Other Ambulatory Visit: Payer: Self-pay | Admitting: Family Medicine

## 2022-12-11 ENCOUNTER — Other Ambulatory Visit: Payer: Self-pay | Admitting: Family Medicine

## 2022-12-11 ENCOUNTER — Ambulatory Visit: Payer: Medicare Other | Admitting: Family Medicine

## 2022-12-12 ENCOUNTER — Other Ambulatory Visit: Payer: Self-pay | Admitting: Family Medicine

## 2022-12-13 ENCOUNTER — Ambulatory Visit (INDEPENDENT_AMBULATORY_CARE_PROVIDER_SITE_OTHER): Payer: Medicare Other | Admitting: Family Medicine

## 2022-12-13 ENCOUNTER — Encounter: Payer: Self-pay | Admitting: Family Medicine

## 2022-12-13 VITALS — BP 112/78 | HR 72 | Temp 97.7°F | Ht 68.0 in | Wt 213.2 lb

## 2022-12-13 DIAGNOSIS — Z23 Encounter for immunization: Secondary | ICD-10-CM

## 2022-12-13 DIAGNOSIS — M7061 Trochanteric bursitis, right hip: Secondary | ICD-10-CM

## 2022-12-13 MED ORDER — METHYLPREDNISOLONE ACETATE 40 MG/ML IJ SUSP
40.0000 mg | Freq: Once | INTRAMUSCULAR | Status: AC
  Administered 2022-12-13: 40 mg via INTRA_ARTICULAR

## 2022-12-13 NOTE — Progress Notes (Signed)
Musculoskeletal Exam  Patient: Zachary [name] [facility] DOB: 03-06-1951  DOS: 12/13/2022  SUBJECTIVE:  Chief Complaint:   Chief Complaint  Patient presents with   Follow-up    Right hip    Zachary [name] is a 71 y.o.  male for evaluation and treatment of R hip pain.   Onset:  1  year  ago. No inj or change in activity.  Location: R outer hip Character:  aching and sharp Progression of issue:  has worsened particularly over the past 10 days Associated symptoms: difficulty going up stairs, sometimes has issues walking; radiates down outside of the thigh to the knee No bruising, redness, swelling Treatment: to date has been OTC NSAIDS and acetaminophen.   Neurovascular symptoms: no  Past Medical History:  Diagnosis Date   Arthritis    Articular cartilage disease    left shoulder   Diverticulitis    Dyslipidemia (high LDL; low HDL) 08/15/2016   GERD (gastroesophageal reflux disease)    Hearing problem    hearing deficit   Hemorrhoids    History of back surgery    Hypertension    Hypothyroidism    Prostate cancer ([place]) 2015   treated and in remission   Sleep apnea    uses a cpap   Stroke ([place])    TIA -affected balance and went through PT.   Wears glasses    Wears hearing aid    both ears    Objective: VITAL SIGNS: BP 112/78 (BP Location: Left Arm, Patient Position: Sitting, Cuff Size: Normal)   Pulse 72   Temp 97.7 F (36.5 C) (Oral)   Ht '5\' 8"'$  (1.727 m)   Wt 213 lb 4 oz (96.7 kg)   SpO2 97%   BMI 32.42 kg/m  Constitutional: Well formed, well developed. No acute distress. Thorax & Lungs: No accessory muscle use Musculoskeletal: R hip.   Normal active range of motion: yes.   Normal passive range of motion: yes Tenderness to palpation: yes over greater troch bursa Deformity: no Ecchymosis: no Tests positive: none Tests negative: [place], logroll, FABER/FADDIR Neurologic: Normal sensory function. No focal deficits noted. DTR's equal and symmetric in  LE's. No clonus. Gait antalgic.  Psychiatric: Normal mood. Age appropriate judgment and insight. Alert & oriented x 3.    Procedure note: Greater trochanteric bursa injection Verbal consent obtained. The area of interest was palpated and demarcated with an otoscope speculum. It was cleaned with an alcohol swab. Freeze spray was used. A 27 g needle was inserted at a perpendicular angle through the area of interested. The plunger was withdrawn to ensure our placement was not in a vessel. 2 mL of 1% lidocaine without epi and 40 mg of Depo-Medrol was injected. A bandaid was placed. The patient tolerated the procedure well.  There were no complications noted.   Assessment:  Greater trochanteric bursitis of right hip - Plan: methylPREDNISolone acetate (DEPO-MEDROL) injection 40 mg, PR DRAIN/INJECT LARGE JOINT/BURSA  Need for influenza vaccination - Plan: Flu Vaccine QUAD High Dose(Fluad)  Plan: Stretches/exercises for IT band, injection today, heat, ice, Tylenol.  Refer to sports medicine if no improvement. F/u prn. The patient voiced understanding and agreement to the plan.   [place], DO 12/13/22  11:09 AM

## 2022-12-13 NOTE — Patient Instructions (Addendum)
Ice/cold pack over area for 10-15 min twice daily.  OK to take Tylenol 1000 mg (2 extra strength tabs) or 975 mg (3 regular strength tabs) every 6 hours as needed.  Heat (pad or rice pillow in microwave) over affected area, 10-15 minutes twice daily.   Let us know if you need anything.  Iliotibial Band Syndrome Rehab It is normal to feel mild stretching, pulling, tightness, or discomfort as you do these exercises, but you should stop right away if you feel sudden pain or your pain gets worse.  Stretching and range of motion exercises These exercises warm up your muscles and joints and improve the movement and flexibility of your hip and pelvis. Exercise A: Quadriceps, prone    Lie on your abdomen on a firm surface, such as a bed or padded floor. Bend your left / right knee and hold your ankle. If you cannot reach your ankle or pant leg, loop a belt around your foot and grab the belt instead. Gently pull your heel toward your buttocks. Your knee should not slide out to the side. You should feel a stretch in the front of your thigh and knee. Hold this position for 30 seconds. Repeat 2 times. Complete this stretch 3 times per week. Exercise B: Iliotibial band    Lie on your side with your left / right leg in the top position. Bend both of your knees and grab your left / right ankle. Stretch out your bottom arm to help you balance. Slowly bring your top knee back so your thigh goes behind your trunk. Slowly lower your top leg toward the floor until you feel a gentle stretch on the outside of your left / right hip and thigh. If you do not feel a stretch and your knee will not fall farther, place the heel of your other foot on top of your knee and pull your knee down toward the floor with your foot. Hold this position for 30 seconds. Repeat 2 times. Complete this stretch 3 times per week. Strengthening exercises These exercises build strength and endurance in your hip and pelvis. Endurance is  the ability to use your muscles for a long time, even after they get tired. Exercise C: Straight leg raises (hip abductors)     Lie on your side with your left / right leg in the top position. Lie so your head, shoulder, knee, and hip line up. You may bend your bottom knee to help you balance. Roll your hips slightly forward so your hips are stacked directly over each other and your left / right knee is facing forward. Tense the muscles in your outer thigh and lift your top leg 4-6 inches (10-15 cm). Hold this position for 3 seconds. Repeat for a total of 10 reps. Slowly return to the starting position. Let your muscles relax completely before doing another repetition. Repeat 2 times. Complete this exercise 3 times per week. Exercise D: Straight leg raises (hip extensors) Lie on your abdomen on your bed or a firm surface. You can put a pillow under your hips if that is more comfortable. Bend your left / right knee so your foot is straight up in the air. Squeeze your buttock muscles and lift your left / right thigh off the bed. Do not let your back arch. Tense this muscle as hard as you can without increasing any knee pain. Hold this position for 2 seconds. Repeat for a total of 10 reps Slowly lower your leg to the  starting position and allow it to relax completely. Repeat 2 times. Complete this exercise 3 times per week. Exercise E: Hip hike Stand sideways on a bottom step. Stand on your left / right leg with your other foot unsupported next to the step. You can hold onto the railing or wall if needed for balance. Keep your knees straight and your torso square. Then, lift your left / right hip up toward the ceiling. Slowly let your left / right hip lower toward the floor, past the starting position. Your foot should get closer to the floor. Do not lean or bend your knees. Repeat 2 times. Complete this exercise 3 times per week.  Document Released: 12/09/2005 Document Revised: 08/13/2016  Document Reviewed: 11/10/2015 Elsevier Interactive Patient Education  [employer].

## 2023-01-11 ENCOUNTER — Other Ambulatory Visit: Payer: Self-pay | Admitting: Family Medicine

## 2023-01-11 DIAGNOSIS — K219 Gastro-esophageal reflux disease without esophagitis: Secondary | ICD-10-CM

## 2023-01-25 ENCOUNTER — Encounter: Payer: Self-pay | Admitting: Family Medicine

## 2023-01-27 ENCOUNTER — Other Ambulatory Visit: Payer: Self-pay | Admitting: Family Medicine

## 2023-01-27 MED ORDER — FLUTICASONE PROPIONATE 50 MCG/ACT NA SUSP: 2.0000 | Freq: Every day | NASAL | 2 refills | Status: DC

## 2023-03-06 ENCOUNTER — Other Ambulatory Visit: Payer: Self-pay | Admitting: Family Medicine

## 2023-03-09 ENCOUNTER — Other Ambulatory Visit: Payer: Self-pay | Admitting: Family Medicine

## 2023-03-15 ENCOUNTER — Other Ambulatory Visit: Payer: Self-pay | Admitting: Family Medicine

## 2023-03-17 ENCOUNTER — Other Ambulatory Visit: Payer: Self-pay | Admitting: Family Medicine

## 2023-03-17 ENCOUNTER — Telehealth: Payer: Self-pay | Admitting: Family Medicine

## 2023-03-17 NOTE — Telephone Encounter (Signed)
Requesting refill but discontinued

## 2023-03-17 NOTE — Telephone Encounter (Signed)
Called the patient and he had a problem with Diverticulitis and is almost out of this and would like a refill please.  He said maybe keep on list because does need form time to time

## 2023-03-17 NOTE — Telephone Encounter (Signed)
Called left message to call back 

## 2023-03-17 NOTE — Telephone Encounter (Signed)
Pt returned CMA's call. Please call back to discuss.

## 2023-03-17 NOTE — Telephone Encounter (Signed)
Called patient back///was a refill question. Taken care of now.

## 2023-03-28 ENCOUNTER — Other Ambulatory Visit: Payer: Self-pay | Admitting: Family Medicine

## 2023-03-28 ENCOUNTER — Encounter: Payer: Self-pay | Admitting: Family Medicine

## 2023-03-28 ENCOUNTER — Ambulatory Visit (INDEPENDENT_AMBULATORY_CARE_PROVIDER_SITE_OTHER): Payer: Medicare Other | Admitting: Family Medicine

## 2023-03-28 VITALS — BP 120/80 | HR 65 | Temp 97.6°F | Ht 68.0 in | Wt 217.1 lb

## 2023-03-28 DIAGNOSIS — M545 Low back pain, unspecified: Secondary | ICD-10-CM

## 2023-03-28 DIAGNOSIS — C61 Malignant neoplasm of prostate: Secondary | ICD-10-CM

## 2023-03-28 MED ORDER — METHOCARBAMOL 500 MG PO TABS: 500.0000 mg | ORAL_TABLET | Freq: Three times a day (TID) | ORAL | 0 refills | Status: DC | PRN

## 2023-03-28 MED ORDER — METHYLPREDNISOLONE ACETATE 80 MG/ML IJ SUSP
80.0000 mg | Freq: Once | INTRAMUSCULAR | Status: AC
  Administered 2023-03-28: 80 mg via INTRAMUSCULAR

## 2023-03-28 NOTE — Addendum Note (Signed)
Addended by: Scharlene Gloss B on: 03/28/2023 08:37 AM   Modules accepted: Orders

## 2023-03-28 NOTE — Progress Notes (Signed)
Musculoskeletal Exam  Patient: Zachary [name] DOB: 1951-04-21  DOS: 03/28/2023  SUBJECTIVE:  Chief Complaint:   No chief complaint on file.   Zachary [name] is a 72 y.o.  male for evaluation and treatment of back pain.   Onset:  1 week ago. No inj/change in activity initially and then bent over to pick up weighted blanket and felt pain.  Location: lower Character:  sharp  Progression of issue:  has worsened Associated symptoms: pain w specific movements No bruising, swelling, redness.  Denies bowel/bladder incontinence or weakness Treatment: to date has been muscle relaxers.   Neurovascular symptoms: no  Past Medical History:  Diagnosis Date   Arthritis    Articular cartilage disease    left shoulder   Diverticulitis    Dyslipidemia (high LDL; low HDL) 08/15/2016   GERD (gastroesophageal reflux disease)    Hearing problem    hearing deficit   Hemorrhoids    History of back surgery    Hypertension    Hypothyroidism    Prostate cancer 2015   treated and in remission   Sleep apnea    uses a cpap   Stroke    TIA -affected balance and went through PT.   Wears glasses    Wears hearing aid    both ears    Objective:  VITAL SIGNS: BP 120/80 (BP Location: Left Arm, Patient Position: Sitting, Cuff Size: Normal)   Pulse 65   Temp 97.6 F (36.4 C) (Oral)   Ht 5\' 8"  (1.727 m)   Wt 217 lb 2 oz (98.5 kg)   SpO2 93%   BMI 33.01 kg/m  Constitutional: Well formed, well developed. No acute distress. HENT: Normocephalic, atraumatic.  Thorax & Lungs:  No accessory muscle use Musculoskeletal: low back.   Tenderness to palpation: Yes over L lateral parasp msc Deformity: no Ecchymosis: no Straight leg test: negative for Poor hamstring flexibility b/l. Neurologic: Normal sensory function. No focal deficits noted. DTR's equal and symmetric in LE's. No clonus. Antalgic gait.  Psychiatric: Normal mood. Age appropriate judgment and insight. Alert & oriented x 3.     Assessment:  Acute left-sided low back pain without sciatica - Plan: methocarbamol (ROBAXIN) 500 MG tablet  Plan: Stretches/exercises, heat, ice, Tylenol. Depo injection today. Will send more Robaxin as this, left over from UC, has been helpful.  F/u prn. The patient voiced understanding and agreement to the plan.   Jilda Roche [place], DO 03/28/23  8:24 AM

## 2023-03-28 NOTE — Patient Instructions (Signed)
Heat (pad or rice pillow in microwave) over affected area, 10-15 minutes twice daily.   Ice/cold pack over area for 10-15 min twice daily.  OK to take Tylenol 1000 mg (2 extra strength tabs) or 975 mg (3 regular strength tabs) every 6 hours as needed.  Go back to those stretches. You may need to incorporate into your routine.   Let us know if you need anything.

## 2023-04-01 ENCOUNTER — Ambulatory Visit (INDEPENDENT_AMBULATORY_CARE_PROVIDER_SITE_OTHER): Payer: Medicare Other | Admitting: Family Medicine

## 2023-04-01 ENCOUNTER — Encounter: Payer: Self-pay | Admitting: Family Medicine

## 2023-04-01 VITALS — BP 130/81 | HR 64 | Temp 97.8°F | Ht 68.0 in | Wt 213.5 lb

## 2023-04-01 DIAGNOSIS — M545 Low back pain, unspecified: Secondary | ICD-10-CM

## 2023-04-01 MED ORDER — TRAMADOL HCL 50 MG PO TABS: 50.0000 mg | ORAL_TABLET | Freq: Three times a day (TID) | ORAL | 0 refills | Status: AC | PRN

## 2023-04-01 MED ORDER — TIZANIDINE HCL 4 MG PO TABS: 4.0000 mg | ORAL_TABLET | Freq: Four times a day (QID) | ORAL | 0 refills | Status: DC | PRN

## 2023-04-01 NOTE — Progress Notes (Signed)
Chief Complaint  Patient presents with   Back Pain    Subjective: Patient is a 72 y.o. male here for f/u LBP.  Patient was seen 4 days ago.  He was treated with a Depo-Medrol injection and methocarbamol.  No improvement for either of these.  He did start doing the stretches and exercises.  He has been using heat and cold packs.  No bowel/bladder incontinence.  No neurologic signs or symptoms.  He did mow his lawn yesterday which seemed to exacerbate his symptoms.  Past Medical History:  Diagnosis Date   Arthritis    Articular cartilage disease    left shoulder   Diverticulitis    Dyslipidemia (high LDL; low HDL) 08/15/2016   GERD (gastroesophageal reflux disease)    Hearing problem    hearing deficit   Hemorrhoids    History of back surgery    Hypertension    Hypothyroidism    Prostate cancer 2015   treated and in remission   Sleep apnea    uses a cpap   Stroke    TIA -affected balance and went through PT.   Wears glasses    Wears hearing aid    both ears    Objective: BP 130/81 (BP Location: Left Arm, Patient Position: Sitting, Cuff Size: Normal)   Pulse 64   Temp 97.8 F (36.6 C) (Oral)   Ht 5\' 8"  (1.727 m)   Wt 213 lb 8 oz (96.8 kg)   SpO2 96%   BMI 32.46 kg/m  General: Awake, appears stated age MSK: +ttp over L lumbar paraspinal musculature (worse than before), poor hamstring range of motion bilaterally, negative straight leg Neuro: Gait antalgic, 5/5 strength throughout the lower extremities DTRs equal symmetric in the lower extremities, no clonus Lungs: No accessory muscle use Psych: Age appropriate judgment and insight, normal affect and mood  Assessment and Plan: Acute left-sided low back pain without sciatica - Plan: tiZANidine (ZANAFLEX) 4 MG tablet, traMADol (ULTRAM) 50 MG tablet  He has been on tramadol before, we will refill this.  Zanaflex as needed to replace methocarbamol.  Warned about potential drowsiness with this medication.  Continue the  stretches.  Avoid aggravating activities like mowing the lawn. Heat/ice.  PT if no better.  The patient voiced understanding and agreement to the plan.  Jilda Roche [place], DO 04/01/23  12:08 PM

## 2023-04-01 NOTE — Patient Instructions (Signed)
Keep heating/icing.  Take muscle relaxer 1-2 hours before planned bedtime. If it makes you drowsy, do not take during the day. You can try half a tab the following night.  OK to take Tylenol 1000 mg (2 extra strength tabs) or 975 mg (3 regular strength tabs) every 6 hours as needed.  Keep doing the stretches.   If no better, please let me know.  Let us know if you need anything.

## 2023-04-12 ENCOUNTER — Other Ambulatory Visit: Payer: Self-pay | Admitting: Family Medicine

## 2023-04-12 DIAGNOSIS — K219 Gastro-esophageal reflux disease without esophagitis: Secondary | ICD-10-CM

## 2023-04-16 ENCOUNTER — Ambulatory Visit (INDEPENDENT_AMBULATORY_CARE_PROVIDER_SITE_OTHER): Payer: Medicare Other | Admitting: Urology

## 2023-04-16 ENCOUNTER — Encounter: Payer: Self-pay | Admitting: Urology

## 2023-04-16 VITALS — BP 160/96 | HR 81 | Ht 68.0 in | Wt 216.0 lb

## 2023-04-16 DIAGNOSIS — Z8546 Personal history of malignant neoplasm of prostate: Secondary | ICD-10-CM

## 2023-04-16 DIAGNOSIS — C61 Malignant neoplasm of prostate: Secondary | ICD-10-CM | POA: Diagnosis not present

## 2023-04-16 DIAGNOSIS — N3040 Irradiation cystitis without hematuria: Secondary | ICD-10-CM

## 2023-04-16 LAB — URINALYSIS, ROUTINE W REFLEX MICROSCOPIC
Bilirubin, UA: NEGATIVE
Glucose, UA: NEGATIVE
Ketones, UA: NEGATIVE
Leukocytes,UA: NEGATIVE
Nitrite, UA: NEGATIVE
Protein,UA: NEGATIVE
RBC, UA: NEGATIVE
Specific Gravity, UA: 1.025 (ref 1.005–1.030)
Urobilinogen, Ur: 0.2 mg/dL (ref 0.2–1.0)
pH, UA: 6 (ref 5.0–7.5)

## 2023-04-16 NOTE — Progress Notes (Signed)
Assessment: 1. Prostate cancer [facility])      Plan: PSA today Continue yearly FU with PSA   Chief Complaint:   History of Present Illness:  Zachary [name] is a 72 y.o. male who is seen in consultation from [place], Jilda Roche, DO for evaluation of prostate cancer status post robotic prostatectomy and adjuvant radiation therapy complicated by radiation cystitis. Patient completed HBO therapy 08/2019 with excellent results clinically as well as on follow-up cystoscopy.  Patient last seen by me in January 2021.  Today I reviewed his prior records.  LUTS- stable ; ipss 14 Urinary control- wears 1ppd.  Has mostly urge type incontinence He does not think his degree of bother concerning LUTS warrants Rx at this time.   He does have ED.  Not an issue.  Not sexually active.   Prostate cancer summary: Initial Dx 03/2013 Trus/Bx Dr. Patsi Sears Path:  Gleason 3+3=6 involving < 5% of 2 cores RB and LLB PSA 5.73 Clinical stage T1c/Nl DRE   Trus Vol 29ml    Patient elected AS Prostate mp-MRI 06/2013:       Suspected high central gland nodularity versus small focus of                                                 macroscopic tumor in the lateral base of the left peripheral zone.                                                 No extracapsular spread, seminal vesicle invasion, or metastatic                                                 disease. PSA 9.25 07/2014 Repeat Trus/Bx 07/2014           Single core 3+3=6 5% RA  Oncotype dx:  Very low risk PSA 10.46 02/2016 PSA 11.22 08/2016 Prostate mp- MRI 04/2017; piRad 4 2cm lesion right mid/base Anteriorly; mri volume 67ml.  No ece, svi, or + LN.  PSA 04/2017:  15.81 Repeat TRUS BX 05/2017:  3 CORES + (4+3=7; 3+4=7; and 3+3=6) TRUS VOL 50ml   Bone scan 05/2017 neg for metastatic disease   S/p RALP + bPLND 06/2017 Path:  pT3aN0  Gleason 3+4=7 with focal + right BN margin He completed adj RT 03/2018.  Well tolerated.  No significant GU/GI  toxicity.   POST OP PSA DATA: 09/2017  <0.01 12/2017  <0.01 06/2018  <0.01 11/2018  <0.01 06/2019   <0.01 12/2019 <0.01  07/2021  0.00     Past Medical History:  Past Medical History:  Diagnosis Date   Arthritis    Articular cartilage disease    left shoulder   Diverticulitis    Dyslipidemia (high LDL; low HDL) 08/15/2016   GERD (gastroesophageal reflux disease)    Hearing problem    hearing deficit   Hemorrhoids    History of back surgery    Hypertension    Hypothyroidism    Prostate cancer 2015   treated and in remission   Sleep apnea  uses a cpap   Stroke    TIA -affected balance and went through PT.   Wears glasses    Wears hearing aid    both ears    Past Surgical History:  Past Surgical History:  Procedure Laterality Date   BACK SURGERY  1978   herniated disksurgery   CARDIAC CATHETERIZATION  08/01/2010   COLONOSCOPY     EXAM UNDER ANESTHESIA WITH MANIPULATION OF SHOULDER Right 09/15/2014   Procedure: RIGHT SHOULDER MANIPULATION UNDER ANESTHESIA;  Surgeon: Loreta Ave, MD;  Location: [facility];  Service: Orthopedics;  Laterality: Right;   HEMORRHOID SURGERY  08/2006   LEFT HEART CATH AND CORONARY ANGIOGRAPHY N/A 01/16/2021   Procedure: LEFT HEART CATH AND CORONARY ANGIOGRAPHY;  Surgeon: Lennette Bihari, MD;  Location: [facility];  Service: Cardiovascular;  Laterality: N/A;   SHOULDER ARTHROSCOPY WITH DISTAL CLAVICLE RESECTION Left 07/07/2015   Procedure: SHOULDER ARTHROSCOPY WITH DISTAL CLAVICLE RESECTION;  Surgeon: Mckinley Jewel, MD;  Location: [facility];  Service: Orthopedics;  Laterality: Left;   SHOULDER ARTHROSCOPY WITH ROTATOR CUFF REPAIR Left 12/07/2015   Procedure: LEFT SHOULDER ARTHROSCOPY DEBRIDEMENT, WITH ROTATOR CUFF REPAIR;  Surgeon: Loreta Ave, MD;  Location: [facility];  Service: Orthopedics;  Laterality: Left;   SHOULDER ARTHROSCOPY WITH ROTATOR CUFF REPAIR AND SUBACROMIAL  DECOMPRESSION Left 07/07/2015   Procedure: LEFT SHOULDER SCOPE DEBRIDEMENT, SUBACROMIAL DECOMPRESSION, DISTAL CLAVICULECTOMY, ROTATOR CUFF REPAIR  ;  Surgeon: Mckinley Jewel, MD;  Location: [facility];  Service: Orthopedics;  Laterality: Left;  ANESTHESIA: GENERAL, PRE/POST OP SCALENE   SHOULDER ARTHROSCOPY WITH SUBACROMIAL DECOMPRESSION, ROTATOR CUFF REPAIR AND BICEP TENDON REPAIR Right 03/10/2014   Procedure: RIGHT SHOULDER ARTHROSCOPY WITH SUBACROMIAL DECOMPRESSION, PARTIAL ACROMIOPLASTY WITH CORACOAROMIAL LABRUM DEBRIDEMENT RELEASE DISTAL CLAVICULECTOMY,  ROTATOR CUFF REPAIR AND EXTENSIVE DEBRIDEMENT;  Surgeon: Loreta Ave, MD;  Location:  SURGERY CENTER;  Service: Orthopedics;  Laterality: Right;   TENDON REPAIR  June 06, 2011   right elbow, Dr. Eulah Pont    Allergies:  Allergies  Allergen Reactions   Oxycodone Itching   Atorvastatin Other (See Comments)   Simvastatin Other (See Comments)    REACTION: muscle pain    Family History:  Family History  Problem Relation Age of Onset   Colon polyps Mother    Breast cancer Mother    Heart disease Father 45   Cancer Brother    Alcohol abuse Other    Arthritis Other    Cancer Other        Breast, Prostate   Coronary artery disease Other    Irritable bowel syndrome Other    Cystic fibrosis Other    Colon cancer Neg Hx    Esophageal cancer Neg Hx    Stomach cancer Neg Hx    Rectal cancer Neg Hx     Social History:  Social History   Tobacco Use   Smoking status: Never   Smokeless tobacco: Never  Vaping Use   Vaping Use: Never used  Substance Use Topics   Alcohol use: Yes    Comment: occasional   Drug use: No    Review of symptoms:  Constitutional:  Negative for unexplained weight loss, night sweats, fever, chills ENT:  Negative for nose bleeds, sinus pain, painful swallowing CV:  Negative for chest pain, shortness of breath, exercise intolerance, palpitations, loss of consciousness Resp:   Negative for cough, wheezing, shortness of breath GI:  Negative for nausea, vomiting, diarrhea, bloody stools GU:  Positives noted in HPI; otherwise negative for gross hematuria, dysuria, urinary incontinence Neuro:  Negative for seizures, poor balance, limb weakness, slurred speech Psych:  Negative for lack of energy, depression, anxiety Endocrine:  Negative for polydipsia, polyuria, symptoms of hypoglycemia (dizziness, hunger, sweating) Hematologic:  Negative for anemia, purpura, petechia, prolonged or excessive bleeding, use of anticoagulants  Allergic:  Negative for difficulty breathing or choking as a result of exposure to anything; no shellfish allergy; no allergic response (rash/itch) to materials, foods  Physical exam: BP (!) 160/96   Pulse 81   Ht  (1.727 m)   Wt 216 lb (98 kg)   BMI 32.84 kg/m  GENERAL APPEARANCE:  Well appearing, well developed, well nourished, NAD

## 2023-04-17 LAB — PSA: Prostate Specific Ag, Serum: <0.1 ng/mL (ref 0.0–4.0)

## 2023-05-09 ENCOUNTER — Encounter: Payer: Self-pay | Admitting: Family Medicine

## 2023-05-09 ENCOUNTER — Ambulatory Visit (INDEPENDENT_AMBULATORY_CARE_PROVIDER_SITE_OTHER): Payer: Medicare Other | Admitting: Family Medicine

## 2023-05-09 VITALS — BP 120/78 | HR 88 | Temp 98.0°F | Ht 68.0 in | Wt 212.2 lb

## 2023-05-09 DIAGNOSIS — J029 Acute pharyngitis, unspecified: Secondary | ICD-10-CM

## 2023-05-09 LAB — POCT RAPID STREP A (OFFICE): Rapid Strep A Screen: NEGATIVE

## 2023-05-09 MED ORDER — HYDROCODONE BIT-HOMATROP MBR 5-1.5 MG/5ML PO SOLN
5.0000 mL | Freq: Three times a day (TID) | ORAL | 0 refills | Status: DC | PRN
Start: 2023-05-09 — End: 2023-05-13

## 2023-05-09 NOTE — Patient Instructions (Addendum)
Consider throat lozenges, salt water gargles and an air humidifier for symptomatic care.   OK to take Tylenol 1000 mg (2 extra strength tabs) or 975 mg (3 regular strength tabs) every 6 hours as needed.  Take the syrup around 8-830 and don't do anything requiring alertness or mobility.   Do not drink alcohol, do any illicit/street drugs, drive or do anything that requires alertness while on this syrup.   Let us know if you need anything.

## 2023-05-09 NOTE — Progress Notes (Signed)
SUBJECTIVE:   Zachary [name] is a 72 y.o. male presents to the clinic for:  Chief Complaint  Patient presents with   Nasal Congestion   Sore Throat    Complains of sore throat for 3 days.  Other associated symptoms: sinus congestion, sore throat, and ST .  Denies: sinus pain, rhinorrhea, itchy watery eyes, ear fullness, ear pain, ear drainage, wheezing, shortness of breath, myalgia, and fevers Sick Contacts: none known Therapy to date: [place]-DM, Tessalon Perles, salt water gargles  Social History   Tobacco Use  Smoking Status Never  Smokeless Tobacco Never    Patient's medications, allergies, past medical, surgical, social and family histories were reviewed and updated as appropriate.  OBJECTIVE:  BP 120/78 (BP Location: Left Arm, Patient Position: Sitting, Cuff Size: Normal)   Pulse 88   Temp 98 F (36.7 C) (Oral)   Ht 5\' 8"  (1.727 m)   Wt 212 lb 4 oz (96.3 kg)   SpO2 97%   BMI 32.27 kg/m  General: Awake, alert, appearing stated age Eyes: conjunctivae and sclerae clear Ears: normal TMs bilaterally Nose: no visible exudate Oropharynx: lips, mucosa, and tongue normal; teeth and gums normal Neck: supple, no significant adenopathy Lungs: clear to auscultation, no wheezes, rales or rhonchi, symmetric air entry, normal effort Heart: RRR Skin:reveals no rash Psych: Age appropriate judgment and insight  ASSESSMENT/PLAN:  Viral pharyngitis - Plan: POCT rapid strep A, HYDROcodone bit-homatropine (HYCODAN) 5-1.5 MG/5ML syrup  Continue to practice good hand hygiene and push fluids. Rapid strep neg.  Acetaminophen for pain. No need for abx.  Warnings about syrup verbalized and written down. Only to use at night.  F/u prn. Pt voiced understanding and agreement to the plan.  Jilda Roche [place], DO 05/09/23 11:57 AM

## 2023-05-13 ENCOUNTER — Ambulatory Visit (INDEPENDENT_AMBULATORY_CARE_PROVIDER_SITE_OTHER): Payer: Medicare Other | Admitting: Family Medicine

## 2023-05-13 ENCOUNTER — Encounter: Payer: Self-pay | Admitting: Family Medicine

## 2023-05-13 VITALS — BP 121/82 | HR 51 | Temp 98.0°F | Ht 68.0 in | Wt 212.1 lb

## 2023-05-13 DIAGNOSIS — B9689 Other specified bacterial agents as the cause of diseases classified elsewhere: Secondary | ICD-10-CM

## 2023-05-13 DIAGNOSIS — J208 Acute bronchitis due to other specified organisms: Secondary | ICD-10-CM

## 2023-05-13 MED ORDER — AMOXICILLIN-POT CLAVULANATE 875-125 MG PO TABS
1.0000 | ORAL_TABLET | Freq: Two times a day (BID) | ORAL | 0 refills | Status: DC
Start: 2023-05-13 — End: 2023-05-27

## 2023-05-13 MED ORDER — PREDNISONE 20 MG PO TABS
40.0000 mg | ORAL_TABLET | Freq: Every day | ORAL | 0 refills | Status: AC
Start: 2023-05-13 — End: 2023-05-18

## 2023-05-13 NOTE — Patient Instructions (Addendum)
Continue to push fluids, practice good hand hygiene, and cover your mouth if you cough.  If you start having fevers, shaking or shortness of breath, seek immediate care.  Send me a message in 2 days if no better.   Let us know if you need anything. 

## 2023-05-13 NOTE — Progress Notes (Signed)
Chief Complaint  Patient presents with   Cough    Zachary [name] here for URI complaints.  Duration: 1 week  Associated symptoms: sinus congestion, rhinorrhea, sore throat, and coughing Denies: sinus pain, itchy watery eyes, ear pain, ear drainage, wheezing, shortness of breath, myalgia, and fevers Treatment to date: Tylenol, rx cough syrup Sick contacts: No  Past Medical History:  Diagnosis Date   Arthritis    Articular cartilage disease    left shoulder   Diverticulitis    Dyslipidemia (high LDL; low HDL) 08/15/2016   GERD (gastroesophageal reflux disease)    Hearing problem    hearing deficit   Hemorrhoids    History of back surgery    Hypertension    Hypothyroidism    Prostate cancer (HCC) 2015   treated and in remission   Sleep apnea    uses a cpap   Stroke (HCC)    TIA -affected balance and went through PT.   Wears glasses    Wears hearing aid    both ears    Objective BP 121/82 (BP Location: Left Arm, Patient Position: Sitting, Cuff Size: Normal)   Pulse (!) 51   Temp 98 F (36.7 C) (Oral)   Ht 5\' 8"  (1.727 m)   Wt 212 lb 2 oz (96.2 kg)   SpO2 96%   BMI 32.25 kg/m  General: Awake, alert, appears stated age HEENT: AT, [place], ears patent b/l and TM's neg, nares patent w/o discharge, pharynx pink and without exudates, MMM Neck: No masses or asymmetry, +ttp over submand glands b/l Heart: Reg rhythm, bradycardic Lungs: CTAB, no accessory muscle use Psych: Age appropriate judgment and insight, normal mood and affect  Acute bacterial bronchitis - Plan: predniSONE (DELTASONE) 20 MG tablet, amoxicillin-clavulanate (AUGMENTIN) 875-125 MG tablet  5 d pred burst 40 mg/d. Augmentin for 7 d. Continue to push fluids, practice good hand hygiene, cover mouth when coughing. F/u prn. If starting to experience fevers, shaking, or shortness of breath, seek immediate care. Pt voiced understanding and agreement to the plan.  [name], DO 05/13/23 3:58  PM

## 2023-05-23 ENCOUNTER — Inpatient Hospital Stay (HOSPITAL_BASED_OUTPATIENT_CLINIC_OR_DEPARTMENT_OTHER)
Admission: EM | Admit: 2023-05-23 | Discharge: 2023-05-27 | DRG: 392 | Disposition: A | Discharge status: Home / Self Care | Payer: Medicare Other | Attending: Internal Medicine | Admitting: Internal Medicine

## 2023-05-23 ENCOUNTER — Emergency Department (HOSPITAL_BASED_OUTPATIENT_CLINIC_OR_DEPARTMENT_OTHER): Payer: Medicare Other

## 2023-05-23 ENCOUNTER — Other Ambulatory Visit: Payer: Self-pay

## 2023-05-23 ENCOUNTER — Encounter (HOSPITAL_BASED_OUTPATIENT_CLINIC_OR_DEPARTMENT_OTHER): Payer: Self-pay

## 2023-05-23 DIAGNOSIS — Z7982 Long term (current) use of aspirin: Secondary | ICD-10-CM

## 2023-05-23 DIAGNOSIS — Z79899 Other long term (current) drug therapy: Secondary | ICD-10-CM | POA: Diagnosis not present

## 2023-05-23 DIAGNOSIS — R001 Bradycardia, unspecified: Secondary | ICD-10-CM | POA: Diagnosis not present

## 2023-05-23 DIAGNOSIS — K5792 Diverticulitis of intestine, part unspecified, without perforation or abscess without bleeding: Secondary | ICD-10-CM | POA: Diagnosis present

## 2023-05-23 DIAGNOSIS — Z888 Allergy status to other drugs, medicaments and biological substances status: Secondary | ICD-10-CM

## 2023-05-23 DIAGNOSIS — I25729 Atherosclerosis of autologous artery coronary artery bypass graft(s) with unspecified angina pectoris: Secondary | ICD-10-CM | POA: Diagnosis not present

## 2023-05-23 DIAGNOSIS — Z8673 Personal history of transient ischemic attack (TIA), and cerebral infarction without residual deficits: Secondary | ICD-10-CM

## 2023-05-23 DIAGNOSIS — E782 Mixed hyperlipidemia: Secondary | ICD-10-CM | POA: Diagnosis present

## 2023-05-23 DIAGNOSIS — Z885 Allergy status to narcotic agent status: Secondary | ICD-10-CM | POA: Diagnosis not present

## 2023-05-23 DIAGNOSIS — Z8249 Family history of ischemic heart disease and other diseases of the circulatory system: Secondary | ICD-10-CM | POA: Diagnosis not present

## 2023-05-23 DIAGNOSIS — Z9079 Acquired absence of other genital organ(s): Secondary | ICD-10-CM

## 2023-05-23 DIAGNOSIS — R109 Unspecified abdominal pain: Secondary | ICD-10-CM | POA: Diagnosis not present

## 2023-05-23 DIAGNOSIS — Z803 Family history of malignant neoplasm of breast: Secondary | ICD-10-CM | POA: Diagnosis not present

## 2023-05-23 DIAGNOSIS — R053 Chronic cough: Secondary | ICD-10-CM | POA: Diagnosis present

## 2023-05-23 DIAGNOSIS — H9190 Unspecified hearing loss, unspecified ear: Secondary | ICD-10-CM | POA: Diagnosis present

## 2023-05-23 DIAGNOSIS — I10 Essential (primary) hypertension: Secondary | ICD-10-CM | POA: Diagnosis not present

## 2023-05-23 DIAGNOSIS — R059 Cough, unspecified: Secondary | ICD-10-CM | POA: Diagnosis not present

## 2023-05-23 DIAGNOSIS — Z886 Allergy status to analgesic agent status: Secondary | ICD-10-CM

## 2023-05-23 DIAGNOSIS — K5732 Diverticulitis of large intestine without perforation or abscess without bleeding: Principal | ICD-10-CM | POA: Diagnosis present

## 2023-05-23 DIAGNOSIS — I700 Atherosclerosis of aorta: Secondary | ICD-10-CM | POA: Diagnosis not present

## 2023-05-23 DIAGNOSIS — K219 Gastro-esophageal reflux disease without esophagitis: Secondary | ICD-10-CM | POA: Diagnosis present

## 2023-05-23 DIAGNOSIS — Z83719 Family history of colon polyps, unspecified: Secondary | ICD-10-CM

## 2023-05-23 DIAGNOSIS — B9689 Other specified bacterial agents as the cause of diseases classified elsewhere: Secondary | ICD-10-CM

## 2023-05-23 DIAGNOSIS — R7303 Prediabetes: Secondary | ICD-10-CM | POA: Diagnosis present

## 2023-05-23 DIAGNOSIS — E785 Hyperlipidemia, unspecified: Secondary | ICD-10-CM | POA: Diagnosis present

## 2023-05-23 DIAGNOSIS — E038 Other specified hypothyroidism: Secondary | ICD-10-CM | POA: Diagnosis not present

## 2023-05-23 DIAGNOSIS — Z8546 Personal history of malignant neoplasm of prostate: Secondary | ICD-10-CM | POA: Diagnosis not present

## 2023-05-23 DIAGNOSIS — Z79890 Hormone replacement therapy (postmenopausal): Secondary | ICD-10-CM | POA: Diagnosis not present

## 2023-05-23 DIAGNOSIS — T464X5A Adverse effect of angiotensin-converting-enzyme inhibitors, initial encounter: Secondary | ICD-10-CM | POA: Diagnosis present

## 2023-05-23 DIAGNOSIS — G4733 Obstructive sleep apnea (adult) (pediatric): Secondary | ICD-10-CM | POA: Diagnosis not present

## 2023-05-23 DIAGNOSIS — Z923 Personal history of irradiation: Secondary | ICD-10-CM

## 2023-05-23 DIAGNOSIS — R079 Chest pain, unspecified: Secondary | ICD-10-CM | POA: Diagnosis not present

## 2023-05-23 DIAGNOSIS — Z1152 Encounter for screening for COVID-19: Secondary | ICD-10-CM

## 2023-05-23 DIAGNOSIS — Z9861 Coronary angioplasty status: Secondary | ICD-10-CM

## 2023-05-23 DIAGNOSIS — I2510 Atherosclerotic heart disease of native coronary artery without angina pectoris: Secondary | ICD-10-CM | POA: Diagnosis present

## 2023-05-23 DIAGNOSIS — E039 Hypothyroidism, unspecified: Secondary | ICD-10-CM | POA: Diagnosis present

## 2023-05-23 LAB — COMPREHENSIVE METABOLIC PANEL
ALT: 35 U/L (ref 0–44)
AST: 23 U/L (ref 15–41)
Albumin: 4 g/dL (ref 3.5–5.0)
Alkaline Phosphatase: 54 U/L (ref 38–126)
Anion gap: 9 (ref 5–15)
BUN: 15 mg/dL (ref 8–23)
CO2: 26 mmol/L (ref 22–32)
Calcium: 9.3 mg/dL (ref 8.9–10.3)
Chloride: 99 mmol/L (ref 98–111)
Creatinine, Ser: 1.05 mg/dL (ref 0.61–1.24)
GFR, Estimated: >60 mL/min (ref >60)
Glucose, Bld: 111 mg/dL — ABNORMAL HIGH (ref 70–99)
Potassium: 3.9 mmol/L (ref 3.5–5.1)
Sodium: 134 mmol/L — ABNORMAL LOW (ref 135–145)
Total Bilirubin: 0.6 mg/dL (ref 0.3–1.2)
Total Protein: 7.4 g/dL (ref 6.5–8.1)

## 2023-05-23 LAB — CBC WITH DIFFERENTIAL/PLATELET
Abs Immature Granulocytes: 0.09 10*3/uL — ABNORMAL HIGH (ref 0.00–0.07)
Basophils Absolute: 0 10*3/uL (ref 0.0–0.1)
Basophils Relative: 0 %
Eosinophils Absolute: 0.5 10*3/uL (ref 0.0–0.5)
Eosinophils Relative: 3 %
HCT: 45 % (ref 39.0–52.0)
Hemoglobin: 15.4 g/dL (ref 13.0–17.0)
Immature Granulocytes: 1 %
Lymphocytes Relative: 23 %
Lymphs Abs: 3.1 10*3/uL (ref 0.7–4.0)
MCH: 31.3 pg (ref 26.0–34.0)
MCHC: 34.2 g/dL (ref 30.0–36.0)
MCV: 91.5 fL (ref 80.0–100.0)
Monocytes Absolute: 1 10*3/uL (ref 0.1–1.0)
Monocytes Relative: 7 %
Neutro Abs: 9.1 10*3/uL — ABNORMAL HIGH (ref 1.7–7.7)
Neutrophils Relative %: 66 %
Platelets: 271 10*3/uL (ref 150–400)
RBC: 4.92 MIL/uL (ref 4.22–5.81)
RDW: 12.7 % (ref 11.5–15.5)
WBC: 13.8 10*3/uL — ABNORMAL HIGH (ref 4.0–10.5)
nRBC: 0 % (ref 0.0–0.2)

## 2023-05-23 LAB — LACTIC ACID, PLASMA: Lactic Acid, Venous: 1.1 mmol/L (ref 0.5–1.9)

## 2023-05-23 LAB — GLUCOSE, CAPILLARY
Glucose-Capillary: 191 mg/dL — ABNORMAL HIGH (ref 70–99)
Glucose-Capillary: 90 mg/dL (ref 70–99)

## 2023-05-23 LAB — LIPASE, BLOOD: Lipase: 52 U/L — ABNORMAL HIGH (ref 11–51)

## 2023-05-23 MED ORDER — ONDANSETRON HCL 4 MG PO TABS: 4.0000 mg | ORAL_TABLET | Freq: Four times a day (QID) | ORAL | Status: DC | PRN

## 2023-05-23 MED ORDER — SODIUM CHLORIDE 0.9 % IV SOLN: INTRAVENOUS | Status: DC | PRN

## 2023-05-23 MED ORDER — MELATONIN 5 MG PO TABS: 5.0000 mg | ORAL_TABLET | Freq: Every evening | ORAL | Status: DC | PRN

## 2023-05-23 MED ORDER — PRAVASTATIN SODIUM 20 MG PO TABS
80.0000 mg | ORAL_TABLET | Freq: Every evening | ORAL | Status: DC
  Administered 2023-05-23 – 2023-05-26 (×4): 80 mg via ORAL
  Filled 2023-05-23 (×4): qty 4

## 2023-05-23 MED ORDER — PIPERACILLIN-TAZOBACTAM 3.375 G IVPB 30 MIN
3.3750 g | Freq: Once | INTRAVENOUS | Status: AC
  Administered 2023-05-23: 3.375 g via INTRAVENOUS
  Filled 2023-05-23: qty 50

## 2023-05-23 MED ORDER — ENOXAPARIN SODIUM 40 MG/0.4ML IJ SOSY
40.0000 mg | PREFILLED_SYRINGE | INTRAMUSCULAR | Status: DC
  Administered 2023-05-23 – 2023-05-26 (×4): 40 mg via SUBCUTANEOUS
  Filled 2023-05-23 (×4): qty 0.4

## 2023-05-23 MED ORDER — ACETAMINOPHEN 650 MG RE SUPP: 650.0000 mg | Freq: Four times a day (QID) | RECTAL | Status: DC | PRN

## 2023-05-23 MED ORDER — ASPIRIN 81 MG PO TBEC
81.0000 mg | DELAYED_RELEASE_TABLET | Freq: Every day | ORAL | Status: DC
  Administered 2023-05-24 – 2023-05-27 (×4): 81 mg via ORAL
  Filled 2023-05-23 (×4): qty 1

## 2023-05-23 MED ORDER — ONDANSETRON HCL 4 MG/2ML IJ SOLN
4.0000 mg | Freq: Four times a day (QID) | INTRAMUSCULAR | Status: DC | PRN
  Administered 2023-05-23: 4 mg via INTRAVENOUS
  Filled 2023-05-23: qty 2

## 2023-05-23 MED ORDER — ONDANSETRON HCL 4 MG/2ML IJ SOLN
4.0000 mg | Freq: Four times a day (QID) | INTRAMUSCULAR | Status: DC | PRN
  Administered 2023-05-25 – 2023-05-27 (×2): 4 mg via INTRAVENOUS
  Filled 2023-05-23 (×2): qty 2

## 2023-05-23 MED ORDER — PANTOPRAZOLE SODIUM 40 MG PO TBEC
40.0000 mg | DELAYED_RELEASE_TABLET | Freq: Every day | ORAL | Status: DC
  Administered 2023-05-24 – 2023-05-27 (×4): 40 mg via ORAL
  Filled 2023-05-23 (×4): qty 1

## 2023-05-23 MED ORDER — IOHEXOL 300 MG/ML  SOLN
100.0000 mL | Freq: Once | INTRAMUSCULAR | Status: AC | PRN
  Administered 2023-05-23: 100 mL via INTRAVENOUS

## 2023-05-23 MED ORDER — DICYCLOMINE HCL 10 MG PO CAPS
10.0000 mg | ORAL_CAPSULE | Freq: Four times a day (QID) | ORAL | Status: DC | PRN
  Administered 2023-05-25: 10 mg via ORAL
  Filled 2023-05-23: qty 1

## 2023-05-23 MED ORDER — SODIUM CHLORIDE 0.9 % IV SOLN: INTRAVENOUS | Status: AC

## 2023-05-23 MED ORDER — LEVOTHYROXINE SODIUM 100 MCG PO TABS
100.0000 ug | ORAL_TABLET | Freq: Every day | ORAL | Status: DC
  Administered 2023-05-23 – 2023-05-26 (×4): 100 ug via ORAL
  Filled 2023-05-23 (×4): qty 1

## 2023-05-23 MED ORDER — PIPERACILLIN-TAZOBACTAM 3.375 G IVPB
3.3750 g | Freq: Three times a day (TID) | INTRAVENOUS | Status: DC
  Administered 2023-05-23 – 2023-05-25 (×6): 3.375 g via INTRAVENOUS
  Filled 2023-05-23 (×8): qty 50

## 2023-05-23 MED ORDER — HYDROMORPHONE HCL 1 MG/ML IJ SOLN
1.0000 mg | Freq: Once | INTRAMUSCULAR | Status: AC
  Administered 2023-05-23: 1 mg via INTRAVENOUS
  Filled 2023-05-23: qty 1

## 2023-05-23 MED ORDER — HYDROMORPHONE HCL 1 MG/ML IJ SOLN
0.5000 mg | Freq: Once | INTRAMUSCULAR | Status: AC
  Administered 2023-05-23: 0.5 mg via INTRAVENOUS
  Filled 2023-05-23: qty 1

## 2023-05-23 MED ORDER — HYDROMORPHONE HCL 1 MG/ML IJ SOLN
1.0000 mg | INTRAMUSCULAR | Status: DC | PRN
  Administered 2023-05-23 – 2023-05-24 (×4): 1 mg via INTRAVENOUS
  Filled 2023-05-23 (×4): qty 1

## 2023-05-23 MED ORDER — LISINOPRIL 5 MG PO TABS
5.0000 mg | ORAL_TABLET | Freq: Every day | ORAL | Status: DC
  Administered 2023-05-24: 5 mg via ORAL
  Filled 2023-05-23: qty 1

## 2023-05-23 MED ORDER — SODIUM BICARBONATE 650 MG PO TABS
650.0000 mg | ORAL_TABLET | Freq: Every day | ORAL | Status: DC
  Administered 2023-05-24 – 2023-05-27 (×4): 650 mg via ORAL
  Filled 2023-05-23 (×4): qty 1

## 2023-05-23 MED ORDER — ACETAMINOPHEN 325 MG PO TABS: 650.0000 mg | ORAL_TABLET | Freq: Four times a day (QID) | ORAL | Status: DC | PRN

## 2023-05-23 MED ORDER — LEVOTHYROXINE SODIUM 100 MCG PO TABS: 100.0000 ug | ORAL_TABLET | Freq: Every day | ORAL | Status: DC

## 2023-05-23 MED ORDER — GUAIFENESIN 100 MG/5ML PO LIQD
5.0000 mL | ORAL | Status: DC | PRN
  Administered 2023-05-23 – 2023-05-24 (×2): 5 mL via ORAL
  Filled 2023-05-23 (×2): qty 10

## 2023-05-23 MED ORDER — LORATADINE 10 MG PO TABS
10.0000 mg | ORAL_TABLET | Freq: Every day | ORAL | Status: DC
  Administered 2023-05-24 – 2023-05-27 (×4): 10 mg via ORAL
  Filled 2023-05-23 (×4): qty 1

## 2023-05-23 NOTE — ED Notes (Signed)
Accidentally charted in wrong chart.

## 2023-05-23 NOTE — ED Triage Notes (Signed)
Patient had a resp infection last week. They gave him antibiotics. His wife stated now he is having a diverticulitis episode. He is having ABD pain, diarrhea with no fever. She stated he has stopped the antibiotics. He is now taking his flrastor and dicyclomine.

## 2023-05-23 NOTE — Progress Notes (Signed)
   05/23/23 2201  BiPAP/CPAP/SIPAP  BiPAP/CPAP/SIPAP Pt Type Adult (Pt prefers self placement when ready for bed.)  BiPAP/CPAP/SIPAP Resmed (Pt has his home equipment)  Mask Type Full face mask  FiO2 (%) 21 %  Heater Temperature  (sterile water added to humidification chamber)  Patient Home Equipment Yes (Pt home equipment.  No freys or cuts in cord and does not spark when plugged in.)  Auto Titrate No

## 2023-05-23 NOTE — H&P (Signed)
History and Physical    Patient: Zachary [name] NWG:[id number] DOB: Apr 07, 1951 DOA: 05/23/2023 DOS: the patient was seen and examined on 05/23/2023 PCP: Sharlene Dory, DO  Patient coming from: Home  Chief Complaint:  Chief Complaint  Patient presents with   Abdominal Pain   HPI: Zachary [name] is a 72 y.o. male with medical history significant of osteoarthritis, left shoulder articular cartilage disease, hyperlipidemia, GERD, impaired hearing, history of back surgery, hypertension, hypothyroid, treated prostate cancer in remission, sleep apnea on CPAP, history of CVA/TIA, hemorrhoids, diverticulosis, diverticulitis who presented to the emergency department with complaints of abdominal pain and 6-7 daily episodes of diarrhea for the past 3 days.  He was taking amoxicillin/clavulanate last week for a respiratory infection.  He has been taking Florastor and dicyclomine recently without significant relief. No  nausea, emesis, constipation, melena or hematochezia.  No flank pain, dysuria, frequency or hematuria.   He denied fever, chills, rhinorrhea, sore throat, wheezing or hemoptysis.  No chest pain, palpitations, diaphoresis, PND, orthopnea or pitting edema of the lower extremities. No polyuria, polydipsia, polyphagia or blurred vision.   Lab work: CBC showed white count 13.8 with 66% neutrophils, 23% lymphocytes and 7% monocytes.  Hemoglobin 15.4 g/dL platelets 578.  CMP showed a sodium of 134 mmol/L and a glucose of 111 mg/dL.  The rest of the CMP measurements were normal.  Lipase was 52 units/L.  Lactic acidosis was normal.  Imaging: CT abdomen/pelvis with contrast showed diverticulitis of the proximal sigmoid colon without abscess formation.  Aortic atherosclerosis.   ED course: Initial vital signs were temperature 97.5 F, pulse 64, respiration 18, BP 162/88 mmHg O2 sat 100% on room air.  The patient received hydromorphone 1 mg IVP, hydromorphone 0.5 mg IVP and was started on  Zosyn.  Review of Systems: As mentioned in the history of present illness. All other systems reviewed and are negative.  Past Medical History:  Diagnosis Date   Arthritis    Articular cartilage disease    left shoulder   Diverticulitis    Dyslipidemia (high LDL; low HDL) 08/15/2016   GERD (gastroesophageal reflux disease)    Hearing problem    hearing deficit   Hemorrhoids    History of back surgery    Hypertension    Hypothyroidism    Prostate cancer (HCC) 2015   treated and in remission   Sleep apnea    uses a cpap   Stroke (HCC)    TIA -affected balance and went through PT.   Wears glasses    Wears hearing aid    both ears   Past Surgical History:  Procedure Laterality Date   BACK SURGERY  1978   herniated disksurgery   CARDIAC CATHETERIZATION  08/01/2010   COLONOSCOPY     EXAM UNDER ANESTHESIA WITH MANIPULATION OF SHOULDER Right 09/15/2014   Procedure: RIGHT SHOULDER MANIPULATION UNDER ANESTHESIA;  Surgeon: Loreta Ave, MD;  Location: [facility];  Service: Orthopedics;  Laterality: Right;   HEMORRHOID SURGERY  08/2006   LEFT HEART CATH AND CORONARY ANGIOGRAPHY N/A 01/16/2021   Procedure: LEFT HEART CATH AND CORONARY ANGIOGRAPHY;  Surgeon: Lennette Bihari, MD;  Location: [facility];  Service: Cardiovascular;  Laterality: N/A;   SHOULDER ARTHROSCOPY WITH DISTAL CLAVICLE RESECTION Left 07/07/2015   Procedure: SHOULDER ARTHROSCOPY WITH DISTAL CLAVICLE RESECTION;  Surgeon: Mckinley Jewel, MD;  Location: [facility];  Service: Orthopedics;  Laterality: Left;   SHOULDER ARTHROSCOPY WITH ROTATOR CUFF REPAIR Left  12/07/2015   Procedure: LEFT SHOULDER ARTHROSCOPY DEBRIDEMENT, WITH ROTATOR CUFF REPAIR;  Surgeon: Loreta Ave, MD;  Location: [facility];  Service: Orthopedics;  Laterality: Left;   SHOULDER ARTHROSCOPY WITH ROTATOR CUFF REPAIR AND SUBACROMIAL DECOMPRESSION Left 07/07/2015   Procedure: LEFT SHOULDER SCOPE  DEBRIDEMENT, SUBACROMIAL DECOMPRESSION, DISTAL CLAVICULECTOMY, ROTATOR CUFF REPAIR  ;  Surgeon: Mckinley Jewel, MD;  Location: [facility];  Service: Orthopedics;  Laterality: Left;  ANESTHESIA: GENERAL, PRE/POST OP SCALENE   SHOULDER ARTHROSCOPY WITH SUBACROMIAL DECOMPRESSION, ROTATOR CUFF REPAIR AND BICEP TENDON REPAIR Right 03/10/2014   Procedure: RIGHT SHOULDER ARTHROSCOPY WITH SUBACROMIAL DECOMPRESSION, PARTIAL ACROMIOPLASTY WITH CORACOAROMIAL LABRUM DEBRIDEMENT RELEASE DISTAL CLAVICULECTOMY,  ROTATOR CUFF REPAIR AND EXTENSIVE DEBRIDEMENT;  Surgeon: Loreta Ave, MD;  Location: [facility];  Service: Orthopedics;  Laterality: Right;   TENDON REPAIR  June 06, 2011   right elbow, Dr. Eulah Pont   Social History:  reports that he has never smoked. He has never used smokeless tobacco. He reports current alcohol use. He reports that he does not use drugs.  Allergies  Allergen Reactions   Oxycodone Itching   Atorvastatin Other (See Comments)    Muscle pain   Mobic [Meloxicam] Other (See Comments)    Severe diarrhea    Simvastatin Other (See Comments)    Muscle pain    Family History  Problem Relation Age of Onset   Colon polyps Mother    Breast cancer Mother    Heart disease Father 32   Cancer Brother    Alcohol abuse Other    Arthritis Other    Cancer Other        Breast, Prostate   Coronary artery disease Other    Irritable bowel syndrome Other    Cystic fibrosis Other    Colon cancer Neg Hx    Esophageal cancer Neg Hx    Stomach cancer Neg Hx    Rectal cancer Neg Hx    Prior to Admission medications   Medication Sig Start Date End Date Taking? Authorizing Provider  amoxicillin-clavulanate (AUGMENTIN) 875-125 MG tablet Take 1 tablet by mouth 2 (two) times daily. 05/13/23  Yes Sharlene Dory, DO  aspirin EC 81 MG EC tablet Take 81 mg by mouth daily.   Yes [provider]  dicyclomine (BENTYL) 10 MG capsule TAKE 1 CAPSULE BY MOUTH  EVERY 6 HOURS AS NEEDED FOR  ABDOMINAL  CRAMPING Patient taking differently: Take 10 mg by mouth every 6 (six) hours as needed for spasms. 03/17/23  Yes Sharlene Dory, DO  fluticasone (FLONASE) 50 MCG/ACT nasal spray Place 2 sprays into both nostrils daily. Patient taking differently: Place 2 sprays into both nostrils as needed for allergies. 01/27/23  Yes Sharlene Dory, DO  levocetirizine (XYZAL) 5 MG tablet Take 1 tablet (5 mg total) by mouth every evening. Patient taking differently: Take 5 mg by mouth at bedtime as needed for allergies. 11/13/22  Yes Sharlene Dory, DO  levothyroxine (SYNTHROID) 100 MCG tablet Take 1 tablet by mouth once daily Patient taking differently: Take 100 mcg by mouth daily. 03/07/23  Yes Sharlene Dory, DO  lisinopril (ZESTRIL) 5 MG tablet Take 1 tablet by mouth once daily Patient taking differently: Take 5 mg by mouth daily. 03/10/23  Yes Sharlene Dory, DO  loratadine (CLARITIN) 10 MG tablet Take 10 mg by mouth daily. 04/25/16  Yes [provider]  melatonin 5 MG TABS Take 5 mg by mouth at bedtime as needed (  sleep).   Yes [provider]  montelukast (SINGULAIR) 10 MG tablet Take 1 tablet (10 mg total) by mouth at bedtime. Patient taking differently: Take 10 mg by mouth at bedtime as needed (allergies). 11/13/22  Yes Sharlene Dory, DO  omeprazole (PRILOSEC) 20 MG capsule Take 1 capsule by mouth once daily Patient taking differently: Take 20 mg by mouth daily. 04/14/23  Yes Sharlene Dory, DO  pravastatin (PRAVACHOL) 80 MG tablet Take 1 tablet by mouth once daily Patient taking differently: Take 80 mg by mouth every evening. 03/17/23  Yes Wendling, Jilda Roche, DO  saccharomyces boulardii (FLORASTOR) 250 MG capsule Take 250 mg by mouth 2 (two) times daily.   Yes [provider]  sodium bicarbonate 650 MG tablet Take 1 tablet by mouth once daily Patient taking differently: Take 650  mg by mouth daily. 04/14/23  Yes Wendling, Jilda Roche, DO  tiZANidine (ZANAFLEX) 4 MG tablet Take 1 tablet (4 mg total) by mouth every 6 (six) hours as needed for muscle spasms. Patient taking differently: Take 4 mg by mouth as needed for muscle spasms. 04/01/23  Yes Sharlene Dory, DO    Physical Exam: Vitals:   05/23/23 1100 05/23/23 1200 05/23/23 1300 05/23/23 1340  BP: 114/73 122/76 105/75 113/76  Pulse: (!) 51 (!) 56 (!) 51 (!) 56  Resp:  16  18  Temp:    98.5 F (36.9 C)  TempSrc:    Oral  SpO2: 96% 98% 99% 95%  Weight:      Height:       Physical Exam Vitals and nursing note reviewed.  Constitutional:      General: He is awake. He is not in acute distress.    Appearance: He is well-developed.  HENT:     Head: Normocephalic.     Nose: No rhinorrhea.     Mouth/Throat:     Mouth: Mucous membranes are moist.  Eyes:     General: No scleral icterus.    Pupils: Pupils are equal, round, and reactive to light.  Neck:     Vascular: No JVD.  Cardiovascular:     Rate and Rhythm: Regular rhythm. Bradycardia present.     Heart sounds: S1 normal and S2 normal.  Pulmonary:     Effort: Pulmonary effort is normal.     Breath sounds: Normal breath sounds.  Abdominal:     General: Bowel sounds are increased. There is no distension.     Tenderness: There is abdominal tenderness in the left lower quadrant. There is no right CVA tenderness, left CVA tenderness, guarding or rebound.  Musculoskeletal:     Cervical back: Neck supple.     Right lower leg: No edema.     Left lower leg: No edema.  Skin:    General: Skin is warm and dry.  Neurological:     General: No focal deficit present.     Mental Status: He is alert and oriented to person, place, and time.  Psychiatric:        Mood and Affect: Mood normal.        Behavior: Behavior normal. Behavior is cooperative.     Data Reviewed:  There are no new results to review at this time.  EKG: Vent. rate 52 BPM PR  interval 186 ms QRS duration 92 ms QT/QTcB 445/414 ms P-R-T axes 51 57 21 Sinus bradycardia  Assessment and Plan: Principal Problem:   Acute diverticulitis Observation/MedSurg. Time-limited IV fluids. Full liquid diet. Analgesics as  needed. Antiemetics as needed. Pantoprazole 40 mg oral daily. Continue Zosyn 3.375 g IVPB every 8 hours. Follow CBC, CMP and lipase in AM.  Active Problems:   Hypothyroidism Continue levothyroxine 100 mcg p.o. bedtime.    Essential hypertension Resume lisinopril 5 mg p.o. daily in AM.    GERD On PPI as above.    OSA (obstructive sleep apnea) Continue CPAP at bedtime.    Dyslipidemia (high LDL; low HDL) Continue pravastatin 80 mg p.o. daily.    Prediabetes Currently on clear liquid diet. CBG monitoring 4 times daily. Check hemoglobin A1c.    Coronary artery disease involving autologous  artery coronary bypass graft with angina pectoris (HCC) Continue ASA, ACE inhibitor and statin as above. Follow-up with cardiology as an outpatient.     Advance Care Planning:   Code Status: Full Code   Consults:   Family Communication: His spouse was at bedside.  Severity of Illness: The appropriate patient status for this patient is INPATIENT. Inpatient status is judged to be reasonable and necessary in order to provide the required intensity of service to ensure the patient's safety. The patient's presenting symptoms, physical exam findings, and initial radiographic and laboratory data in the context of their chronic comorbidities is felt to place them at high risk for further clinical deterioration. Furthermore, it is not anticipated that the patient will be medically stable for discharge from the hospital within 2 midnights of admission.   * I certify that at the point of admission it is my clinical judgment that the patient will require inpatient hospital care spanning beyond 2 midnights from the point of admission due to high intensity of  service, high risk for further deterioration and high frequency of surveillance required.*  Author: Bobette Mo, MD 05/23/2023 2:46 PM  For on call review [email or web address].   This document was prepared using Dragon voice recognition software and may contain some unintended transcription errors.

## 2023-05-23 NOTE — ED Provider Notes (Signed)
EMERGENCY DEPARTMENT AT [facility] Provider Note   CSN: [id number] Arrival date & time: 05/23/23  4098     History  Chief Complaint  Patient presents with   Abdominal Pain    Zachary [name] is a 72 y.o. male with hypertension, hypothyroidism, prostate cancer status post robotic prostatectomy and adjuvant radiation therapy 2020 complicated by radiation cystitis, OSA, history of diverticulitis, GERD, CAD status post 2 PCIs, h/o TIA who presents with severe abdominal pain.  Per chart review patient was recently seen on 05/13/2023 for URI symptoms and diagnosed with acute bacterial bronchitis, prescribed prednisone and Augmentin. His wife stated now he is having a diverticulitis episode. Has completed the prior antibiotics. He is now taking his flrastor (probiotic) and dicyclomine. Has constant abd pain with spikes of 10/10 pain, located LLQ, worsening over the last 3 days. No urinary symptoms. +nonbloody diarrhea, last BM was this AM. No oral intake this AM. No prior abd surgical history. Doesn't take any blood thinners.    Marland Kitchen   HPI     Home Medications Prior to Admission medications   Medication Sig Start Date End Date Taking? Authorizing Provider  amoxicillin-clavulanate (AUGMENTIN) 875-125 MG tablet Take 1 tablet by mouth 2 (two) times daily. 05/13/23  Yes Sharlene Dory, DO  aspirin EC 81 MG EC tablet Take 81 mg by mouth daily.   Yes [provider]  dicyclomine (BENTYL) 10 MG capsule TAKE 1 CAPSULE BY MOUTH EVERY 6 HOURS AS NEEDED FOR  ABDOMINAL  CRAMPING Patient taking differently: Take 10 mg by mouth every 6 (six) hours as needed for spasms. 03/17/23  Yes Sharlene Dory, DO  fluticasone (FLONASE) 50 MCG/ACT nasal spray Place 2 sprays into both nostrils daily. Patient taking differently: Place 2 sprays into both nostrils as needed for allergies. 01/27/23  Yes Sharlene Dory, DO  levocetirizine (XYZAL) 5 MG tablet Take 1  tablet (5 mg total) by mouth every evening. Patient taking differently: Take 5 mg by mouth at bedtime as needed for allergies. 11/13/22  Yes Sharlene Dory, DO  levothyroxine (SYNTHROID) 100 MCG tablet Take 1 tablet by mouth once daily Patient taking differently: Take 100 mcg by mouth daily. 03/07/23  Yes Sharlene Dory, DO  lisinopril (ZESTRIL) 5 MG tablet Take 1 tablet by mouth once daily Patient taking differently: Take 5 mg by mouth daily. 03/10/23  Yes Sharlene Dory, DO  loratadine (CLARITIN) 10 MG tablet Take 10 mg by mouth daily. 04/25/16  Yes [provider]  melatonin 5 MG TABS Take 5 mg by mouth at bedtime as needed (sleep).   Yes [provider]  montelukast (SINGULAIR) 10 MG tablet Take 1 tablet (10 mg total) by mouth at bedtime. Patient taking differently: Take 10 mg by mouth at bedtime as needed (allergies). 11/13/22  Yes Sharlene Dory, DO  omeprazole (PRILOSEC) 20 MG capsule Take 1 capsule by mouth once daily Patient taking differently: Take 20 mg by mouth daily. 04/14/23  Yes Sharlene Dory, DO  pravastatin (PRAVACHOL) 80 MG tablet Take 1 tablet by mouth once daily Patient taking differently: Take 80 mg by mouth every evening. 03/17/23  Yes Wendling, Jilda Roche, DO  saccharomyces boulardii (FLORASTOR) 250 MG capsule Take 250 mg by mouth 2 (two) times daily.   Yes [provider]  sodium bicarbonate 650 MG tablet Take 1 tablet by mouth once daily Patient taking differently: Take 650 mg by mouth daily. 04/14/23  Yes Wendling, Jilda Roche,  DO  tiZANidine (ZANAFLEX) 4 MG tablet Take 1 tablet (4 mg total) by mouth every 6 (six) hours as needed for muscle spasms. Patient taking differently: Take 4 mg by mouth as needed for muscle spasms. 04/01/23  Yes Sharlene Dory, DO      Allergies    Oxycodone, Atorvastatin, Mobic [meloxicam], and Simvastatin    Review of Systems   Review of Systems Review of  systems Negative for f/c, nausea/vomiting.  A 10 point review of systems was performed and is negative unless otherwise reported in HPI.  Physical Exam Updated Vital Signs BP (!) 162/88   Pulse 64   Temp (!) 97.5 F (36.4 C) (Oral)   Resp 18   Ht 5\' 8"  (1.727 m)   Wt 96.2 kg   SpO2 100%   BMI 32.25 kg/m  Physical Exam General: Uncomfortable-appearing male, lying in bed.  HEENT:  Sclera anicteric, MMM, trachea midline.  Cardiology: RRR, no murmurs/rubs/gallops. BL radial and DP pulses equal bilaterally.  Resp: Normal respiratory rate and effort. CTAB, no wheezes, rhonchi, crackles.  Abd: +LLQ TTP with rebound tenderness and guarding. Soft, non-distended.  GU: Deferred. MSK: No peripheral edema or signs of trauma. Extremities without deformity or TTP. No cyanosis or clubbing. Skin: warm, dry. Neuro: A&Ox4, CNs II-XII grossly intact. MAEs. Sensation grossly intact.  Psych: Normal mood and affect.   ED Results / Procedures / Treatments   Labs (all labs ordered are listed, but only abnormal results are displayed) Labs Reviewed  CBC WITH DIFFERENTIAL/PLATELET - Abnormal; Notable for the following components:      Result Value   WBC 13.8 (*)    Neutro Abs 9.1 (*)    Abs Immature Granulocytes 0.09 (*)    All other components within normal limits  COMPREHENSIVE METABOLIC PANEL - Abnormal; Notable for the following components:   Sodium 134 (*)    Glucose, Bld 111 (*)    All other components within normal limits  LIPASE, BLOOD - Abnormal; Notable for the following components:   Lipase 52 (*)    All other components within normal limits  LACTIC ACID, PLASMA  LACTIC ACID, PLASMA    EKG EKG Interpretation  Date/Time:  Friday May 23 2023 08:02:28 EDT Ventricular Rate:  52 PR Interval:  186 QRS Duration: 92 QT Interval:  445 QTC Calculation: 414 R Axis:   57 Text Interpretation: Sinus bradycardia Confirmed by Vivi Barrack [phone]) on 05/23/2023 8:21:19 AM  Radiology CT  ABDOMEN PELVIS W CONTRAST  Result Date: 05/23/2023 CLINICAL DATA:  Abdominal pain. EXAM: CT ABDOMEN AND PELVIS WITH CONTRAST TECHNIQUE: Multidetector CT imaging of the abdomen and pelvis was performed using the standard protocol following bolus administration of intravenous contrast. RADIATION DOSE REDUCTION: This exam was performed according to the departmental dose-optimization program which includes automated exposure control, adjustment of the mA and/or kV according to patient size and/or use of iterative reconstruction technique. CONTRAST:  OMNIPAQUE IOHEXOL 300 MG/ML  SOLN COMPARISON:  November 13, 2021. FINDINGS: Lower chest: No acute abnormality. Hepatobiliary: No focal liver abnormality is seen. No gallstones, gallbladder wall thickening, or biliary dilatation. Pancreas: Unremarkable. No pancreatic ductal dilatation or surrounding inflammatory changes. Spleen: Normal in size without focal abnormality. Adrenals/Urinary Tract: Adrenal glands appear normal. Bilateral parapelvic cysts are noted. No hydronephrosis or renal obstruction is noted. Urinary bladder is unremarkable. Stomach/Bowel: Stomach is unremarkable. The appendix appears normal. There is no evidence of bowel obstruction. Diverticulitis of proximal sigmoid colon is noted without abscess formation. Vascular/Lymphatic: Aortic atherosclerosis.  No enlarged abdominal or pelvic lymph nodes. Reproductive: Patient appears to be status post prostatectomy. Other: No abdominal wall hernia or abnormality. No abdominopelvic ascites. Musculoskeletal: No acute or significant osseous findings. IMPRESSION: Diverticulitis of proximal sigmoid colon is noted without abscess formation. Aortic Atherosclerosis (ICD10-I70.0). Electronically Signed   By: Lupita Raider M.D.   On: 05/23/2023 08:44    Procedures Procedures    Medications Ordered in ED Medications  ondansetron (ZOFRAN) injection 4 mg (4 mg Intravenous Given 05/23/23 0759)   piperacillin-tazobactam (ZOSYN) IVPB 3.375 g (3.375 g Intravenous New Bag/Given 05/23/23 0938)  piperacillin-tazobactam (ZOSYN) IVPB 3.375 g (has no administration in time range)  0.9 %  sodium chloride infusion ( Intravenous New Bag/Given 05/23/23 0937)  HYDROmorphone (DILAUDID) injection 0.5 mg (0.5 mg Intravenous Given 05/23/23 0751)  iohexol (OMNIPAQUE) 300 MG/ML solution 100 mL (100 mLs Intravenous Contrast Given 05/23/23 0812)  HYDROmorphone (DILAUDID) injection 0.5 mg (0.5 mg Intravenous Given 05/23/23 0827)    ED Course/ Medical Decision Making/ A&P                          Medical Decision Making Amount and/or Complexity of Data Reviewed Labs: ordered. Decision-making details documented in ED Course. Radiology: ordered. Decision-making details documented in ED Course.  Risk Prescription drug management. Decision regarding hospitalization.    This patient presents to the ED for concern of abd pain, this involves an extensive number of treatment options, and is a complaint that carries with it a high risk of complications and morbidity.  I considered the following differential and admission for this acute, potentially life threatening condition.   MDM:    For DDX for abdominal pain includes but is not limited to:  Abdominal exam with peritoneal signs. Evidence of acute abdomen at this time w/ guarding in LLQ and rebound tenderness. Has h/o diverticulitis but worse. Consider perforation/abscess given concerning abdominal exam and will obtain CT abd pelvis. No signs of sepsis at this time on vitals. Lower suspicion for acute hepatobiliary disease (including acute cholecystitis or cholangitis), acute pancreatitis (neg lipase), PUD (including gastric perforation), acute appendicitis, vascular catastrophe, bowel obstruction, viscus perforation.   Clinical Course as of 05/23/23 0946  Fri May 23, 2023  0744 WBC(!): 13.8 +leukocytosis [HN]  0820 Lactic Acid, Venous: 1.1 [HN]  0820  Lipase(!): 52 [HN]  0820 Comprehensive metabolic panel(!) Unremarkable in the context of this patient's presentation  [HN]  0857 CT ABDOMEN PELVIS W CONTRAST Diverticulitis of proximal sigmoid colon is noted without abscess formation.   [HN]  1610 D/w patient. He developed diverticulitis while taking augmentin at home. His pain is also still severe after IV dilaudid. I offered both admission and discharge and performed shared decision making. Though he has uncomplicated diverticulitis, I believe patient would be best served by admission and IV antibiotics. Patient agrees as his pain is still severe. Will order zosyn and consult to hospitalist.  [HN]    Clinical Course User Index [HN] Loetta Rough, MD    Labs: I Ordered, and personally interpreted labs.  The pertinent results include:  those listed above  Imaging Studies ordered: I ordered imaging studies including CT abd pelvis w contrast I independently visualized and interpreted imaging. I agree with the radiologist interpretation  Additional history obtained from chart review, wife at bedside.    Cardiac Monitoring: The patient was maintained on a cardiac monitor.  I personally viewed and interpreted the cardiac monitored which showed an underlying rhythm  of: NSR< sinus bradycardia  Reevaluation: After the interventions noted above, I reevaluated the patient and found that they have :improved  Social Determinants of Health: Patient lives independently   Disposition:  Admitted  Co morbidities that complicate the patient evaluation  Past Medical History:  Diagnosis Date   Arthritis    Articular cartilage disease    left shoulder   Diverticulitis    Dyslipidemia (high LDL; low HDL) 08/15/2016   GERD (gastroesophageal reflux disease)    Hearing problem    hearing deficit   Hemorrhoids    History of back surgery    Hypertension    Hypothyroidism    Prostate cancer (HCC) 2015   treated and in remission   Sleep  apnea    uses a cpap   Stroke (HCC)    TIA -affected balance and went through PT.   Wears glasses    Wears hearing aid    both ears     Medicines Meds ordered this encounter  Medications   ondansetron (ZOFRAN) injection 4 mg   HYDROmorphone (DILAUDID) injection 0.5 mg   iohexol (OMNIPAQUE) 300 MG/ML solution 100 mL   HYDROmorphone (DILAUDID) injection 0.5 mg   piperacillin-tazobactam (ZOSYN) IVPB 3.375 g    Order Specific Question:   Antibiotic Indication:    Answer:   Intra-abdominal Infection   piperacillin-tazobactam (ZOSYN) IVPB 3.375 g    Order Specific Question:   Antibiotic Indication:    Answer:   Intra-abdominal Infection   0.9 %  sodium chloride infusion    Carrier Fluid Protocol    I have reviewed the patients home medicines and have made adjustments as needed  Problem List / ED Course: Problem List Items Addressed This Visit   None Visit Diagnoses     Diverticulitis of sigmoid colon    -  Primary   Relevant Medications   HYDROmorphone (DILAUDID) injection 0.5 mg (Completed)   HYDROmorphone (DILAUDID) injection 0.5 mg (Completed)   piperacillin-tazobactam (ZOSYN) IVPB 3.375 g   piperacillin-tazobactam (ZOSYN) IVPB 3.375 g (Start on 05/23/2023  4:00 PM)                   This note was created using dictation software, which may contain spelling or grammatical errors.    Loetta Rough, MD 05/23/23 [phone]

## 2023-05-23 NOTE — ED Notes (Signed)
Report given to Eugenia RN

## 2023-05-23 NOTE — Progress Notes (Signed)
Plan of Care Note for accepted transfer   Patient: Zachary [name] MRN: [id number]   DOA: 05/23/2023  Facility requesting transfer: [facility]. Requesting Provider: Lanny Cramp MD. Reason for transfer: Acute diverticulitis. Facility course:  Per Dr. Jearld Fenton: "Chief Complaint  Patient presents with   Abdominal Pain      GRACIELA [name] is a 72 y.o. male with hypertension, hypothyroidism, prostate cancer status post robotic prostatectomy and adjuvant radiation therapy 2020 complicated by radiation cystitis, OSA, history of diverticulitis, GERD, CAD status post 2 PCIs, h/o TIA who presents with severe abdominal pain.  Per chart review patient was recently seen on 05/13/2023 for URI symptoms and diagnosed with acute bacterial bronchitis, prescribed prednisone and Augmentin. His wife stated now he is having a diverticulitis episode. Has completed the prior antibiotics. He is now taking his flrastor (probiotic) and dicyclomine. Has constant abd pain with spikes of 10/10 pain, located LLQ, worsening over the last 3 days. No urinary symptoms. +nonbloody diarrhea, last BM was this AM. No oral intake this AM. No prior abd surgical history. Doesn't take any blood thinners."  Labwork: Lactic acid, plasma [[id number]]   Collected: 05/23/23 0747   Updated: 05/23/23 0818   Specimen Type: Blood   Specimen Source: Vein    Lactic Acid, Venous 1.1 mmol/L  Comprehensive metabolic panel [[id number]] (Abnormal)   Collected: 05/23/23 0729   Updated: 05/23/23 0752   Specimen Type: Blood   Specimen Source: Vein    Sodium 134 Low  mmol/L   Potassium 3.9 mmol/L   Chloride 99 mmol/L   CO2 26 mmol/L   Glucose, Bld 111 High  mg/dL   BUN 15 mg/dL   Creatinine, Ser 0.86 mg/dL   Calcium 9.3 mg/dL   Total Protein 7.4 g/dL   Albumin 4.0 g/dL   AST 23 U/L   ALT 35 U/L   Alkaline Phosphatase 54 U/L   Total Bilirubin 0.6 mg/dL   GFR, Estimated >57 mL/min   Anion gap 9  Lipase, blood [[id number]]  (Abnormal)   Collected: 05/23/23 0729   Updated: 05/23/23 0752   Specimen Type: Blood   Specimen Source: Vein    Lipase 52 High  U/L  CBC with Differential [[id number]] (Abnormal)   Collected: 05/23/23 0729   Updated: 05/23/23 0744   Specimen Type: Blood   Specimen Source: Vein    WBC 13.8 High  K/uL   RBC 4.92 MIL/uL   Hemoglobin 15.4 g/dL   HCT 02.7 %   MCV 25.3 fL   MCH 31.3 pg   MCHC 34.2 g/dL   RDW 66.4 %   Platelets 271 K/uL   nRBC 0.0 %   Neutrophils Relative % 66 %   Neutro Abs 9.1 High  K/uL   Lymphocytes Relative 23 %   Lymphs Abs 3.1 K/uL   Monocytes Relative 7 %   Monocytes Absolute 1.0 K/uL   Eosinophils Relative 3 %   Eosinophils Absolute 0.5 K/uL   Basophils Relative 0 %   Basophils Absolute 0.0 K/uL   Immature Granulocytes 1 %   Abs Immature Granulocytes 0.09 High  K/uL   Imaging: CT abdomen/pelvis with contrast: FINDINGS: Lower chest: No acute abnormality.   Hepatobiliary: No focal liver abnormality is seen. No gallstones, gallbladder wall thickening, or biliary dilatation.   Pancreas: Unremarkable. No pancreatic ductal dilatation or surrounding inflammatory changes.   Spleen: Normal in size without focal abnormality.   Adrenals/Urinary Tract: Adrenal glands appear normal. Bilateral parapelvic cysts are noted. No hydronephrosis or  renal obstruction is noted. Urinary bladder is unremarkable.   Stomach/Bowel: Stomach is unremarkable. The appendix appears normal. There is no evidence of bowel obstruction. Diverticulitis of proximal sigmoid colon is noted without abscess formation.   Vascular/Lymphatic: Aortic atherosclerosis. No enlarged abdominal or pelvic lymph nodes.   Reproductive: Patient appears to be status post prostatectomy.   Other: No abdominal wall hernia or abnormality. No abdominopelvic ascites.   Musculoskeletal: No acute or significant osseous findings.   IMPRESSION: Diverticulitis of proximal sigmoid colon is noted  without abscess formation. Aortic Atherosclerosis (ICD10-I70.0).   Electronically Signed   By: Lupita Raider M.D.   On: 05/23/2023 08:44  Plan of care: The patient is accepted for admission to Med-surg  unit, at [facility].  He has received analgesics and was started on Zosyn IVPB.  Author: Bobette Mo, MD 05/23/2023  Check [email or web address] for on-call coverage.  Nursing staff, Please call [facility] Admits & Consults System-Wide number on Amion as soon as patient's arrival, so appropriate admitting provider can evaluate the pt.

## 2023-05-24 DIAGNOSIS — K5792 Diverticulitis of intestine, part unspecified, without perforation or abscess without bleeding: Secondary | ICD-10-CM | POA: Diagnosis not present

## 2023-05-24 DIAGNOSIS — E038 Other specified hypothyroidism: Secondary | ICD-10-CM

## 2023-05-24 DIAGNOSIS — I10 Essential (primary) hypertension: Secondary | ICD-10-CM

## 2023-05-24 DIAGNOSIS — I25729 Atherosclerosis of autologous artery coronary artery bypass graft(s) with unspecified angina pectoris: Secondary | ICD-10-CM

## 2023-05-24 DIAGNOSIS — R059 Cough, unspecified: Secondary | ICD-10-CM | POA: Diagnosis present

## 2023-05-24 DIAGNOSIS — E782 Mixed hyperlipidemia: Secondary | ICD-10-CM

## 2023-05-24 DIAGNOSIS — G4733 Obstructive sleep apnea (adult) (pediatric): Secondary | ICD-10-CM | POA: Diagnosis not present

## 2023-05-24 DIAGNOSIS — K219 Gastro-esophageal reflux disease without esophagitis: Secondary | ICD-10-CM

## 2023-05-24 DIAGNOSIS — R053 Chronic cough: Secondary | ICD-10-CM

## 2023-05-24 LAB — CBC
HCT: 41.5 % (ref 39.0–52.0)
Hemoglobin: 14 g/dL (ref 13.0–17.0)
MCH: 32.1 pg (ref 26.0–34.0)
MCHC: 33.7 g/dL (ref 30.0–36.0)
MCV: 95.2 fL (ref 80.0–100.0)
Platelets: 218 10*3/uL (ref 150–400)
RBC: 4.36 MIL/uL (ref 4.22–5.81)
RDW: 13 % (ref 11.5–15.5)
WBC: 9.4 10*3/uL (ref 4.0–10.5)
nRBC: 0 % (ref 0.0–0.2)

## 2023-05-24 LAB — BASIC METABOLIC PANEL
Anion gap: 8 (ref 5–15)
BUN: 11 mg/dL (ref 8–23)
CO2: 24 mmol/L (ref 22–32)
Calcium: 8.5 mg/dL — ABNORMAL LOW (ref 8.9–10.3)
Chloride: 103 mmol/L (ref 98–111)
Creatinine, Ser: 1.12 mg/dL (ref 0.61–1.24)
GFR, Estimated: >60 mL/min (ref >60)
Glucose, Bld: 98 mg/dL (ref 70–99)
Potassium: 4.2 mmol/L (ref 3.5–5.1)
Sodium: 135 mmol/L (ref 135–145)

## 2023-05-24 LAB — GLUCOSE, CAPILLARY
Glucose-Capillary: 88 mg/dL (ref 70–99)
Glucose-Capillary: 92 mg/dL (ref 70–99)
Glucose-Capillary: 79 mg/dL (ref 70–99)
Glucose-Capillary: 94 mg/dL (ref 70–99)

## 2023-05-24 MED ORDER — HYDROMORPHONE HCL 1 MG/ML IJ SOLN
1.0000 mg | INTRAMUSCULAR | Status: DC | PRN
  Administered 2023-05-24 – 2023-05-25 (×6): 1 mg via INTRAVENOUS
  Filled 2023-05-24 (×6): qty 1

## 2023-05-24 MED ORDER — LACTATED RINGERS IV SOLN: INTRAVENOUS | Status: DC

## 2023-05-24 MED ORDER — HYDROMORPHONE HCL 1 MG/ML IJ SOLN: 0.5000 mg | INTRAMUSCULAR | Status: DC | PRN

## 2023-05-24 MED ORDER — FLUTICASONE PROPIONATE 50 MCG/ACT NA SUSP: 1.0000 | Freq: Every day | NASAL | Status: DC

## 2023-05-24 MED ORDER — FLUTICASONE PROPIONATE 50 MCG/ACT NA SUSP
1.0000 | Freq: Every day | NASAL | Status: DC
  Administered 2023-05-24 – 2023-05-27 (×4): 1 via NASAL
  Filled 2023-05-24: qty 16

## 2023-05-24 NOTE — Assessment & Plan Note (Signed)
Patient is currently chest pain free Monitoring patient on telemetry Continue home regimen of antiplatelet therapy and lipid lowering therapy

## 2023-05-24 NOTE — Progress Notes (Signed)
PROGRESS NOTE   Zachary [name]  ZOX:[id number] DOB: 12/21/1951 DOA: 05/23/2023 PCP: Sharlene Dory, DO   Date of Service: the patient was seen and examined on 05/24/2023  Brief Narrative:  72 y.o. male with medical history significant of hyperlipidemia, GERD, hypertension, hypothyroidism, treated prostate cancer in remission, sleep apnea on CPAP, history of CVA/TIA,  who presented to the emergency department with complaints of abdominal pain and 6-7 daily episodes of diarrhea for the past 3 days.    Upon evaluation in the emergency department CT imaging of the abdomen pelvis revealed proximal sigmoid diverticulitis without abscess or perforation.  The hospitalist group was then called to assess the patient for admission to the hospital.    Patient was admitted to hospital service, initiated on intravenous fluids and intravenous antibiotics with Zosyn.     Assessment and Plan: * Acute diverticulitis Continuing intravenous Zosyn Liquid diet As needed opiate-based analgesics for substantial associated pain Continue intravenous isotonic fluids No evidence of abscess or microperforation on imaging Considering patient has had approximately 8 episodes of diverticulitis patient would benefit from eventual outpatient surgical evaluation for resection  Coronary artery disease involving autologous artery coronary bypass graft with angina pectoris [facility]) Patient is currently chest pain free Monitoring patient on telemetry Continue home regimen of antiplatelet therapy and lipid lowering therapy  Cough Over 1 month history of cough that has not responded to outpatient antibiotics Multiple possible etiologies including the possibility of uncontrolled GERD, bronchospasm, postnasal drip due to chronic sinusitis and lisinopril side effect Initiating scheduled Flonase daily Discontinuing lisinopril Monitoring for symptomatic improvement  OSA (obstructive sleep apnea) Continuing home  regimen of CPAP nightly   Essential hypertension Discontinuing lisinopril due to cough If blood pressures warranted, will instead place patient on valsartan  GERD without esophagitis Continuing home regimen of daily PPI therapy.   Mixed hyperlipidemia Continuing home regimen of lipid lowering therapy.   Hypothyroidism Resume home regimen of Synthroid         Subjective:  Patient continuing to complain of abdominal pain.  Pain is located in the left lower quadrant, moderate in intensity, sharp in quality.  Patient reports that this pain is improved since admission.  Patient denies any associated nausea or vomiting.  Patient states that his associated diarrhea is improving.  Physical Exam:  Vitals:   05/24/23 1001 05/24/23 1401 05/24/23 1811 05/24/23 2120  BP: 118/80 112/73 128/76 139/76  Pulse: (!) 52 (!) 52 72 60  Resp: 14 16 16 19   Temp: 97.9 F (36.6 C) 98.1 F (36.7 C) 97.8 F (36.6 C) 97.9 F (36.6 C)  TempSrc: Oral Oral Oral Oral  SpO2: 95% 96% 99% 95%  Weight:      Height:         Constitutional: Awake alert and oriented x3, no associated distress.   Skin: no rashes, no lesions, good skin turgor noted. Eyes: Pupils are equally reactive to light.  No evidence of scleral icterus or conjunctival pallor.  ENMT: Moist mucous membranes noted.  Posterior pharynx clear of any exudate or lesions.   Respiratory: clear to auscultation bilaterally, no wheezing, no crackles. Normal respiratory effort. No accessory muscle use.  Cardiovascular: Regular rate and rhythm, no murmurs / rubs / gallops. No extremity edema. 2+ pedal pulses. No carotid bruits.  Abdomen: Notable tenderness of the abdomen primarily in the left lower quadrant.  Normoactive bowel sounds.  Abdomen is soft.   Musculoskeletal: No joint deformity upper and lower extremities. Good ROM, no contractures. Normal muscle  tone.    Data Reviewed:  I have personally reviewed and interpreted labs,  imaging.  Significant findings are   CBC: Recent Labs  Lab 05/23/23 0729 05/24/23 0531  WBC 13.8* 9.4  NEUTROABS 9.1*  --   HGB 15.4 14.0  HCT 45.0 41.5  MCV 91.5 95.2  PLT 271 218   Basic Metabolic Panel: Recent Labs  Lab 05/23/23 0729 05/24/23 0531  NA 134* 135  K 3.9 4.2  CL 99 103  CO2 26 24  GLUCOSE 111* 98  BUN 15 11  CREATININE 1.05 1.12  CALCIUM 9.3 8.5*   GFR: Estimated Creatinine Clearance: 70 mL/min (by C-G formula based on SCr of 1.12 mg/dL). Liver Function Tests: Recent Labs  Lab 05/23/23 0729  AST 23  ALT 35  ALKPHOS 54  BILITOT 0.6  PROT 7.4  ALBUMIN 4.0      Code Status:  Full code.  Code status decision has been confirmed with: patient Family Communication: Daughter is at bedside who has been updated on plan of care   Severity of Illness:  The appropriate patient status for this patient is INPATIENT. Inpatient status is judged to be reasonable and necessary in order to provide the required intensity of service to ensure the patient's safety. The patient's presenting symptoms, physical exam findings, and initial radiographic and laboratory data in the context of their chronic comorbidities is felt to place them at high risk for further clinical deterioration. Furthermore, it is not anticipated that the patient will be medically stable for discharge from the hospital within 2 midnights of admission.   * I certify that at the point of admission it is my clinical judgment that the patient will require inpatient hospital care spanning beyond 2 midnights from the point of admission due to high intensity of service, high risk for further deterioration and high frequency of surveillance required.*  Time spent:  55 minutes  Author:  Marinda Elk MD  05/24/2023 10:20 PM

## 2023-05-24 NOTE — Assessment & Plan Note (Signed)
.   Continuing home regimen of lipid lowering therapy.  

## 2023-05-24 NOTE — Progress Notes (Signed)
   05/24/23 2228  BiPAP/CPAP/SIPAP  Reason BIPAP/CPAP not in use Other(comment) (patient coughing too much. States he will put it on himself if she feels better. Its his home unit)

## 2023-05-24 NOTE — Assessment & Plan Note (Addendum)
Patient reports no improvement in pain  Will transition patient from full liquid diet to clear liquid diet  Continue intravenous Zosyn Will reassess in 24 hours and if pain continues to not improve will consider repeat imaging versus surgery consult. As needed opiate-based analgesics for substantial associated pain Patient has been transitioned off of intravenous fluids today. No evidence of abscess or microperforation on admission imaging Considering patient has had approximately 8 episodes of diverticulitis patient would benefit from eventual outpatient surgical evaluation for resection

## 2023-05-24 NOTE — Assessment & Plan Note (Signed)
.   Resume home regimen of Synthroid 

## 2023-05-24 NOTE — Assessment & Plan Note (Signed)
Continuing home regimen of daily PPI therapy.  

## 2023-05-24 NOTE — Assessment & Plan Note (Signed)
Patient seemingly normotensive off of lisinopril If blood pressures rise again, will instead place patient on valsartan

## 2023-05-24 NOTE — Assessment & Plan Note (Signed)
.   Continuing home regimen of CPAP nightly  

## 2023-05-24 NOTE — Assessment & Plan Note (Addendum)
Cough improved with discontinuation of lisinopril and initiation of scheduled intranasal fluticasone  Monitoring for symptomatic improvement

## 2023-05-24 NOTE — Hospital Course (Signed)
72 y.o. male with medical history significant of hyperlipidemia, GERD, hypertension, hypothyroidism, treated prostate cancer in remission, sleep apnea on CPAP, history of CVA/TIA,  who presented to the emergency department with complaints of abdominal pain and 6-7 daily episodes of diarrhea for the past 3 days.    Upon evaluation in the emergency department CT imaging of the abdomen pelvis revealed proximal sigmoid diverticulitis without abscess or perforation.  The hospitalist group was then called to assess the patient for admission to the hospital.    Patient was admitted to hospital service, initiated on intravenous fluids and intravenous antibiotics with Zosyn.  Pain was managed with as needed opiate-based analgesics throughout the hospitalization.  Recovery was slow going but gradual.  Diet was advanced slowly and tolerated well.  It is also worth noting that during this hospitalization the patient was noted to have a chronic cough.  This cough has been going on for at least 1 to 2 months and has been managed with antibiotics in the outpatient setting.  Patient's lisinopril was discontinued and patient's cough quickly improved the following 24 to 48 hours.  It is felt that the patient's chronic cough is likely secondary to ACE inhibitor use.  Patient's blood pressures have been normotensive throughout the remainder of the hospitalization and an alternative antihypertensive has not been initiated in its place.  Considering this was the patient's eighth episode of diverticulitis, the case was discussed with surgery.  It was recommended that the patient seek a follow-up appointment with colorectal surgery in the outpatient setting in the coming weeks for outpatient evaluation of potential surgical resection.  Patient was provided with contact information to schedule this appointment at time of discharge.  The patient clinically improved and was discharged home in improved and stable condition 05/27/2023  with a remaining course of antibiotic therapy with ciprofloxacin and Flagyl to complete 14 days.

## 2023-05-25 ENCOUNTER — Inpatient Hospital Stay (HOSPITAL_COMMUNITY): Payer: Medicare Other

## 2023-05-25 DIAGNOSIS — I25729 Atherosclerosis of autologous artery coronary artery bypass graft(s) with unspecified angina pectoris: Secondary | ICD-10-CM | POA: Diagnosis not present

## 2023-05-25 DIAGNOSIS — R053 Chronic cough: Secondary | ICD-10-CM | POA: Diagnosis not present

## 2023-05-25 DIAGNOSIS — K5792 Diverticulitis of intestine, part unspecified, without perforation or abscess without bleeding: Secondary | ICD-10-CM | POA: Diagnosis not present

## 2023-05-25 DIAGNOSIS — E039 Hypothyroidism, unspecified: Secondary | ICD-10-CM

## 2023-05-25 DIAGNOSIS — G4733 Obstructive sleep apnea (adult) (pediatric): Secondary | ICD-10-CM | POA: Diagnosis not present

## 2023-05-25 LAB — CBC WITH DIFFERENTIAL/PLATELET
Abs Immature Granulocytes: 0.03 10*3/uL (ref 0.00–0.07)
Basophils Absolute: 0 10*3/uL (ref 0.0–0.1)
Basophils Relative: 1 %
Eosinophils Absolute: 0.5 10*3/uL (ref 0.0–0.5)
Eosinophils Relative: 7 %
HCT: 40.4 % (ref 39.0–52.0)
Hemoglobin: 13.5 g/dL (ref 13.0–17.0)
Immature Granulocytes: 0 %
Lymphocytes Relative: 27 %
Lymphs Abs: 1.9 10*3/uL (ref 0.7–4.0)
MCH: 31.9 pg (ref 26.0–34.0)
MCHC: 33.4 g/dL (ref 30.0–36.0)
MCV: 95.5 fL (ref 80.0–100.0)
Monocytes Absolute: 0.6 10*3/uL (ref 0.1–1.0)
Monocytes Relative: 9 %
Neutro Abs: 4 10*3/uL (ref 1.7–7.7)
Neutrophils Relative %: 56 %
Platelets: 205 10*3/uL (ref 150–400)
RBC: 4.23 MIL/uL (ref 4.22–5.81)
RDW: 12.9 % (ref 11.5–15.5)
WBC: 7.1 10*3/uL (ref 4.0–10.5)
nRBC: 0 % (ref 0.0–0.2)

## 2023-05-25 LAB — COMPREHENSIVE METABOLIC PANEL
ALT: 25 U/L (ref 0–44)
AST: 16 U/L (ref 15–41)
Albumin: 3.4 g/dL — ABNORMAL LOW (ref 3.5–5.0)
Alkaline Phosphatase: 38 U/L (ref 38–126)
Anion gap: 6 (ref 5–15)
BUN: 8 mg/dL (ref 8–23)
CO2: 24 mmol/L (ref 22–32)
Calcium: 8.6 mg/dL — ABNORMAL LOW (ref 8.9–10.3)
Chloride: 105 mmol/L (ref 98–111)
Creatinine, Ser: 1.03 mg/dL (ref 0.61–1.24)
GFR, Estimated: >60 mL/min (ref >60)
Glucose, Bld: 94 mg/dL (ref 70–99)
Potassium: 3.9 mmol/L (ref 3.5–5.1)
Sodium: 135 mmol/L (ref 135–145)
Total Bilirubin: 0.7 mg/dL (ref 0.3–1.2)
Total Protein: 6.1 g/dL — ABNORMAL LOW (ref 6.5–8.1)

## 2023-05-25 LAB — GLUCOSE, CAPILLARY
Glucose-Capillary: 84 mg/dL (ref 70–99)
Glucose-Capillary: 84 mg/dL (ref 70–99)
Glucose-Capillary: 82 mg/dL (ref 70–99)
Glucose-Capillary: 109 mg/dL — ABNORMAL HIGH (ref 70–99)

## 2023-05-25 LAB — MAGNESIUM: Magnesium: 1.7 mg/dL (ref 1.7–2.4)

## 2023-05-25 MED ORDER — POLYETHYLENE GLYCOL 3350 17 G PO PACK
17.0000 g | PACK | Freq: Every day | ORAL | Status: DC | PRN
  Administered 2023-05-25: 17 g via ORAL
  Filled 2023-05-25: qty 1

## 2023-05-25 MED ORDER — CIPROFLOXACIN HCL 500 MG PO TABS
500.0000 mg | ORAL_TABLET | Freq: Two times a day (BID) | ORAL | Status: DC
  Administered 2023-05-25: 500 mg via ORAL
  Filled 2023-05-25: qty 1

## 2023-05-25 MED ORDER — METRONIDAZOLE 500 MG PO TABS
500.0000 mg | ORAL_TABLET | Freq: Two times a day (BID) | ORAL | Status: DC
  Administered 2023-05-25: 500 mg via ORAL
  Filled 2023-05-25: qty 1

## 2023-05-25 MED ORDER — PIPERACILLIN-TAZOBACTAM 3.375 G IVPB
3.3750 g | Freq: Three times a day (TID) | INTRAVENOUS | Status: DC
  Administered 2023-05-25 – 2023-05-27 (×5): 3.375 g via INTRAVENOUS
  Filled 2023-05-25 (×5): qty 50

## 2023-05-25 NOTE — Progress Notes (Signed)
   05/25/23 1955  BiPAP/CPAP/SIPAP  $ Non-Invasive Ventilator  (S)   (Pt states he was coughing so much so he send his home cpap back home with wife. Asked if he would like me to bring machine. He declined and said he would call wife to bring it back tomorrow.)  Reason BIPAP/CPAP not in use Other(comment) (Pt delcined tonight and would try tomorrow again.)

## 2023-05-25 NOTE — Progress Notes (Signed)
PROGRESS NOTE   Zachary [name]  ZOX:[id number] DOB: Aug 13, 1951 DOA: 05/23/2023 PCP: Sharlene Dory, DO   Date of Service: the patient was seen and examined on 05/25/2023  Brief Narrative:  72 y.o. male with medical history significant of hyperlipidemia, GERD, hypertension, hypothyroidism, treated prostate cancer in remission, sleep apnea on CPAP, history of CVA/TIA,  who presented to the emergency department with complaints of abdominal pain and 6-7 daily episodes of diarrhea for the past 3 days.    Upon evaluation in the emergency department CT imaging of the abdomen pelvis revealed proximal sigmoid diverticulitis without abscess or perforation.  The hospitalist group was then called to assess the patient for admission to the hospital.    Patient was admitted to hospital service, initiated on intravenous fluids and intravenous antibiotics with Zosyn.     Assessment and Plan: * Acute diverticulitis Patient reports no improvement in pain  Will transition patient from full liquid diet to clear liquid diet  Continue intravenous Zosyn Will reassess in 24 hours and if pain continues to not improve will consider repeat imaging versus surgery consult. As needed opiate-based analgesics for substantial associated pain Patient has been transitioned off of intravenous fluids today. No evidence of abscess or microperforation on admission imaging Considering patient has had approximately 8 episodes of diverticulitis patient would benefit from eventual outpatient surgical evaluation for resection  Coronary artery disease involving autologous artery coronary bypass graft with angina pectoris [facility]) Patient is currently chest pain free Monitoring patient on telemetry Continue home regimen of antiplatelet therapy and lipid lowering therapy  Cough Cough improved with discontinuation of lisinopril and initiation of scheduled intranasal fluticasone  Monitoring for symptomatic  improvement  OSA (obstructive sleep apnea) Continuing home regimen of CPAP nightly   Essential hypertension Patient seemingly normotensive off of lisinopril If blood pressures rise again, will instead place patient on valsartan  GERD without esophagitis Continuing home regimen of daily PPI therapy.   Hypothyroidism Resume home regimen of Synthroid   Mixed hyperlipidemia Continuing home regimen of lipid lowering therapy.     Subjective:  Patient continuing to complain of abdominal pain reporting that it is essentially unchanged since yesterday.  Pain is moderate to severe in intensity, worse in the left lower quadrant however radiating diffusely.  Pain is sharp in quality and worse with movement.  Patient denies any associated vomiting.    Physical Exam:  Vitals:   05/24/23 2120 05/25/23 0459 05/25/23 1401 05/25/23 2025  BP: 139/76 116/81 117/76 127/85  Pulse: 60 (!) 51 (!) 52 (!) 51  Resp: 19 15 16 17   Temp: 97.9 F (36.6 C) 98.6 F (37 C) 98.4 F (36.9 C) 98.2 F (36.8 C)  TempSrc: Oral Oral Oral Oral  SpO2: 95% 97% 96% 99%  Weight:      Height:         Constitutional: Awake alert and oriented x3, patient is in mild distress due to pain. Skin: no rashes, no lesions, poor skin turgor noted. Eyes: Pupils are equally reactive to light.  No evidence of scleral icterus or conjunctival pallor.  ENMT: Moist mucous membranes noted.  Posterior pharynx clear of any exudate or lesions.   Respiratory: clear to auscultation bilaterally, no wheezing, no crackles. Normal respiratory effort. No accessory muscle use.  Cardiovascular: Regular rate and rhythm, no murmurs / rubs / gallops. No extremity edema. 2+ pedal pulses. No carotid bruits.  Abdomen: Severe left lower quadrant tenderness.  Mild tenderness in the other quadrants.  Abdomen is soft.  Normoactive bowel sounds. Musculoskeletal: No joint deformity upper and lower extremities. Good ROM, no contractures. Normal muscle  tone.    Data Reviewed:  I have personally reviewed and interpreted labs, imaging.  Significant findings are   CBC: Recent Labs  Lab 05/23/23 0729 05/24/23 0531 05/25/23 0429  WBC 13.8* 9.4 7.1  NEUTROABS 9.1*  --  4.0  HGB 15.4 14.0 13.5  HCT 45.0 41.5 40.4  MCV 91.5 95.2 95.5  PLT 271 218 205   Basic Metabolic Panel: Recent Labs  Lab 05/23/23 0729 05/24/23 0531 05/25/23 0429  NA 134* 135 135  K 3.9 4.2 3.9  CL 99 103 105  CO2 26 24 24   GLUCOSE 111* 98 94  BUN 15 11 8   CREATININE 1.05 1.12 1.03  CALCIUM 9.3 8.5* 8.6*  MG  --   --  1.7   GFR: Estimated Creatinine Clearance: 76.1 mL/min (by C-G formula based on SCr of 1.03 mg/dL). Liver Function Tests: Recent Labs  Lab 05/23/23 0729 05/25/23 0429  AST 23 16  ALT 35 25  ALKPHOS 54 38  BILITOT 0.6 0.7  PROT 7.4 6.1*  ALBUMIN 4.0 3.4*      Code Status:  Full code.  Code status decision has been confirmed with: patient Family Communication: Daughter is at bedside who has been updated on plan of care on 6/1.   Severity of Illness:  The appropriate patient status for this patient is INPATIENT. Inpatient status is judged to be reasonable and necessary in order to provide the required intensity of service to ensure the patient's safety. The patient's presenting symptoms, physical exam findings, and initial radiographic and laboratory data in the context of their chronic comorbidities is felt to place them at high risk for further clinical deterioration. Furthermore, it is not anticipated that the patient will be medically stable for discharge from the hospital within 2 midnights of admission.   * I certify that at the point of admission it is my clinical judgment that the patient will require inpatient hospital care spanning beyond 2 midnights from the point of admission due to high intensity of service, high risk for further deterioration and high frequency of surveillance required.*  Time spent:  50  minutes  Author:  Marinda Elk MD  05/25/2023 10:58 PM

## 2023-05-26 DIAGNOSIS — I25729 Atherosclerosis of autologous artery coronary artery bypass graft(s) with unspecified angina pectoris: Secondary | ICD-10-CM | POA: Diagnosis not present

## 2023-05-26 DIAGNOSIS — G4733 Obstructive sleep apnea (adult) (pediatric): Secondary | ICD-10-CM | POA: Diagnosis not present

## 2023-05-26 DIAGNOSIS — R053 Chronic cough: Secondary | ICD-10-CM | POA: Diagnosis not present

## 2023-05-26 DIAGNOSIS — K5792 Diverticulitis of intestine, part unspecified, without perforation or abscess without bleeding: Secondary | ICD-10-CM | POA: Diagnosis not present

## 2023-05-26 LAB — COMPREHENSIVE METABOLIC PANEL
ALT: 32 U/L (ref 0–44)
AST: 23 U/L (ref 15–41)
Albumin: 3.5 g/dL (ref 3.5–5.0)
Alkaline Phosphatase: 35 U/L — ABNORMAL LOW (ref 38–126)
Anion gap: 7 (ref 5–15)
BUN: 9 mg/dL (ref 8–23)
CO2: 26 mmol/L (ref 22–32)
Calcium: 8.9 mg/dL (ref 8.9–10.3)
Chloride: 101 mmol/L (ref 98–111)
Creatinine, Ser: 1.22 mg/dL (ref 0.61–1.24)
GFR, Estimated: >60 mL/min (ref >60)
Glucose, Bld: 96 mg/dL (ref 70–99)
Potassium: 3.9 mmol/L (ref 3.5–5.1)
Sodium: 134 mmol/L — ABNORMAL LOW (ref 135–145)
Total Bilirubin: 0.7 mg/dL (ref 0.3–1.2)
Total Protein: 6.5 g/dL (ref 6.5–8.1)

## 2023-05-26 LAB — CBC WITH DIFFERENTIAL/PLATELET
Abs Immature Granulocytes: 0.02 10*3/uL (ref 0.00–0.07)
Basophils Absolute: 0 10*3/uL (ref 0.0–0.1)
Basophils Relative: 1 %
Eosinophils Absolute: 0.4 10*3/uL (ref 0.0–0.5)
Eosinophils Relative: 7 %
HCT: 40.5 % (ref 39.0–52.0)
Hemoglobin: 13.5 g/dL (ref 13.0–17.0)
Immature Granulocytes: 0 %
Lymphocytes Relative: 32 %
Lymphs Abs: 1.9 10*3/uL (ref 0.7–4.0)
MCH: 31.1 pg (ref 26.0–34.0)
MCHC: 33.3 g/dL (ref 30.0–36.0)
MCV: 93.3 fL (ref 80.0–100.0)
Monocytes Absolute: 0.6 10*3/uL (ref 0.1–1.0)
Monocytes Relative: 11 %
Neutro Abs: 3 10*3/uL (ref 1.7–7.7)
Neutrophils Relative %: 49 %
Platelets: 219 10*3/uL (ref 150–400)
RBC: 4.34 MIL/uL (ref 4.22–5.81)
RDW: 12.4 % (ref 11.5–15.5)
WBC: 6 10*3/uL (ref 4.0–10.5)
nRBC: 0 % (ref 0.0–0.2)

## 2023-05-26 LAB — HEMOGLOBIN A1C
Hgb A1c MFr Bld: 6.1 % — ABNORMAL HIGH (ref 4.8–5.6)
Mean Plasma Glucose: 128 mg/dL

## 2023-05-26 LAB — MAGNESIUM: Magnesium: 1.9 mg/dL (ref 1.7–2.4)

## 2023-05-26 LAB — PHOSPHORUS: Phosphorus: 3 mg/dL (ref 2.5–4.6)

## 2023-05-26 LAB — C-REACTIVE PROTEIN: CRP: 4 mg/dL — ABNORMAL HIGH (ref <1.0)

## 2023-05-26 LAB — GLUCOSE, CAPILLARY: Glucose-Capillary: 79 mg/dL (ref 70–99)

## 2023-05-26 NOTE — Progress Notes (Signed)
Pt has home cpap machine at bedside and stated he can place self on when ready for bed.

## 2023-05-26 NOTE — Progress Notes (Signed)
PROGRESS NOTE   Zachary [name]  ZOX:[id number] DOB: 1951-04-18 DOA: 05/23/2023 PCP: Sharlene Dory, DO   Date of Service: the patient was seen and examined on 05/26/2023  Brief Narrative:  72 y.o. male with medical history significant of hyperlipidemia, GERD, hypertension, hypothyroidism, treated prostate cancer in remission, sleep apnea on CPAP, history of CVA/TIA,  who presented to the emergency department with complaints of abdominal pain and 6-7 daily episodes of diarrhea for the past 3 days.    Upon evaluation in the emergency department CT imaging of the abdomen pelvis revealed proximal sigmoid diverticulitis without abscess or perforation.  The hospitalist group was then called to assess the patient for admission to the hospital.    Patient was admitted to hospital service, initiated on intravenous fluids and intravenous antibiotics with Zosyn.     Assessment and Plan: * Acute diverticulitis Follow-up liquid diet Continue intravenous Zosyn Case discussed with general surgery.  They recommend continued medical management with outpatient surgery follow-up As needed opiate-based analgesics for substantial associated pain No evidence of abscess or microperforation on admission imaging  Coronary artery disease involving autologous artery coronary bypass graft with angina pectoris [facility]) Patient is currently chest pain free Monitoring patient on telemetry Continue home regimen of antiplatelet therapy and lipid lowering therapy  Cough Cough improved with discontinuation of lisinopril and initiation of scheduled intranasal fluticasone  Monitoring for symptomatic improvement  OSA (obstructive sleep apnea) Continuing home regimen of CPAP nightly   Essential hypertension Patient seemingly normotensive off of lisinopril If blood pressures rise again, will instead place patient on valsartan  GERD without esophagitis Continuing home regimen of daily PPI  therapy.   Hypothyroidism Resume home regimen of Synthroid   Mixed hyperlipidemia Continuing home regimen of lipid lowering therapy.    Subjective:  Patient complaining of abdominal pain, moderate to severe in intensity, sharp in quality, located in the right lower quadrant.   Physical Exam:  Vitals:   05/25/23 2025 05/26/23 0550 05/26/23 1358 05/26/23 2030  BP: 127/85 (!) 141/89 108/76 131/84  Pulse: (!) 51 (!) 56 (!) 54 62  Resp: 17 17 18 18   Temp: 98.2 F (36.8 C) 98 F (36.7 C) 97.8 F (36.6 C) 98.6 F (37 C)  TempSrc: Oral Oral Oral Oral  SpO2: 99% 99% 98% 98%  Weight:      Height:         Constitutional: Awake alert and oriented x3, no cyst or distress. Skin: no rashes, no lesions, poor skin turgor noted. Eyes: Pupils are equally reactive to light.  No evidence of scleral icterus or conjunctival pallor.  ENMT: Moist mucous membranes noted.  Posterior pharynx clear of any exudate or lesions.   Respiratory: clear to auscultation bilaterally, no wheezing, no crackles. Normal respiratory effort. No accessory muscle use.  Cardiovascular: Regular rate and rhythm, no murmurs / rubs / gallops. No extremity edema. 2+ pedal pulses. No carotid bruits.  Abdomen: ongoing tenderness in the left lower quadrant somewhat improved since yesterday..  Abdomen is soft.  Normoactive bowel sounds. Musculoskeletal: No joint deformity upper and lower extremities. Good ROM, no contractures. Normal muscle tone.    Data Reviewed:  I have personally reviewed and interpreted labs, imaging.  Significant findings are   CBC: Recent Labs  Lab 05/23/23 0729 05/24/23 0531 05/25/23 0429 05/26/23 0443  WBC 13.8* 9.4 7.1 6.0  NEUTROABS 9.1*  --  4.0 3.0  HGB 15.4 14.0 13.5 13.5  HCT 45.0 41.5 40.4 40.5  MCV 91.5 95.2 95.5  93.3  PLT 271 218 205 219   Basic Metabolic Panel: Recent Labs  Lab 05/23/23 0729 05/24/23 0531 05/25/23 0429 05/26/23 0443  NA 134* 135 135 134*  K 3.9 4.2  3.9 3.9  CL 99 103 105 101  CO2 26 24 24 26   GLUCOSE 111* 98 94 96  BUN 15 11 8 9   CREATININE 1.05 1.12 1.03 1.22  CALCIUM 9.3 8.5* 8.6* 8.9  MG  --   --  1.7 1.9  PHOS  --   --   --  3.0   GFR: Estimated Creatinine Clearance: 64.3 mL/min (by C-G formula based on SCr of 1.22 mg/dL). Liver Function Tests: Recent Labs  Lab 05/23/23 0729 05/25/23 0429 05/26/23 0443  AST 23 16 23   ALT 35 25 32  ALKPHOS 54 38 35*  BILITOT 0.6 0.7 0.7  PROT 7.4 6.1* 6.5  ALBUMIN 4.0 3.4* 3.5      Code Status:  Full code.  Code status decision has been confirmed with: patient Family Communication: Wife is at bedside who has been updated on plan of care.   Severity of Illness:  The appropriate patient status for this patient is INPATIENT. Inpatient status is judged to be reasonable and necessary in order to provide the required intensity of service to ensure the patient's safety. The patient's presenting symptoms, physical exam findings, and initial radiographic and laboratory data in the context of their chronic comorbidities is felt to place them at high risk for further clinical deterioration. Furthermore, it is not anticipated that the patient will be medically stable for discharge from the hospital within 2 midnights of admission.   * I certify that at the point of admission it is my clinical judgment that the patient will require inpatient hospital care spanning beyond 2 midnights from the point of admission due to high intensity of service, high risk for further deterioration and high frequency of surveillance required.*  Time spent:  38 minutes  Author:  Marinda Elk MD  05/26/2023 10:51 PM

## 2023-05-27 DIAGNOSIS — I10 Essential (primary) hypertension: Secondary | ICD-10-CM | POA: Diagnosis not present

## 2023-05-27 DIAGNOSIS — K5792 Diverticulitis of intestine, part unspecified, without perforation or abscess without bleeding: Secondary | ICD-10-CM | POA: Diagnosis not present

## 2023-05-27 DIAGNOSIS — G4733 Obstructive sleep apnea (adult) (pediatric): Secondary | ICD-10-CM | POA: Diagnosis not present

## 2023-05-27 DIAGNOSIS — I25729 Atherosclerosis of autologous artery coronary artery bypass graft(s) with unspecified angina pectoris: Secondary | ICD-10-CM | POA: Diagnosis not present

## 2023-05-27 LAB — COMPREHENSIVE METABOLIC PANEL
ALT: 38 U/L (ref 0–44)
AST: 26 U/L (ref 15–41)
Albumin: 3.6 g/dL (ref 3.5–5.0)
Alkaline Phosphatase: 40 U/L (ref 38–126)
Anion gap: 8 (ref 5–15)
BUN: 9 mg/dL (ref 8–23)
CO2: 25 mmol/L (ref 22–32)
Calcium: 9.3 mg/dL (ref 8.9–10.3)
Chloride: 103 mmol/L (ref 98–111)
Creatinine, Ser: 1.18 mg/dL (ref 0.61–1.24)
GFR, Estimated: >60 mL/min (ref >60)
Glucose, Bld: 104 mg/dL — ABNORMAL HIGH (ref 70–99)
Potassium: 3.8 mmol/L (ref 3.5–5.1)
Sodium: 136 mmol/L (ref 135–145)
Total Bilirubin: 0.8 mg/dL (ref 0.3–1.2)
Total Protein: 7 g/dL (ref 6.5–8.1)

## 2023-05-27 LAB — CBC WITH DIFFERENTIAL/PLATELET
Abs Immature Granulocytes: 0.02 10*3/uL (ref 0.00–0.07)
Basophils Absolute: 0 10*3/uL (ref 0.0–0.1)
Basophils Relative: 1 %
Eosinophils Absolute: 0.4 10*3/uL (ref 0.0–0.5)
Eosinophils Relative: 7 %
HCT: 42.1 % (ref 39.0–52.0)
Hemoglobin: 14.2 g/dL (ref 13.0–17.0)
Immature Granulocytes: 0 %
Lymphocytes Relative: 28 %
Lymphs Abs: 1.7 10*3/uL (ref 0.7–4.0)
MCH: 31.3 pg (ref 26.0–34.0)
MCHC: 33.7 g/dL (ref 30.0–36.0)
MCV: 92.9 fL (ref 80.0–100.0)
Monocytes Absolute: 0.7 10*3/uL (ref 0.1–1.0)
Monocytes Relative: 10 %
Neutro Abs: 3.4 10*3/uL (ref 1.7–7.7)
Neutrophils Relative %: 54 %
Platelets: 231 10*3/uL (ref 150–400)
RBC: 4.53 MIL/uL (ref 4.22–5.81)
RDW: 12.5 % (ref 11.5–15.5)
WBC: 6.2 10*3/uL (ref 4.0–10.5)
nRBC: 0 % (ref 0.0–0.2)

## 2023-05-27 LAB — MISC LABCORP TEST (SEND OUT): Labcorp test code: 83935

## 2023-05-27 LAB — MAGNESIUM: Magnesium: 1.9 mg/dL (ref 1.7–2.4)

## 2023-05-27 MED ORDER — HYDROCODONE-ACETAMINOPHEN 5-325 MG PO TABS: 1.0000 | ORAL_TABLET | Freq: Four times a day (QID) | ORAL | 0 refills | Status: AC | PRN

## 2023-05-27 MED ORDER — METRONIDAZOLE 500 MG PO TABS: 500.0000 mg | ORAL_TABLET | Freq: Two times a day (BID) | ORAL | 0 refills | Status: AC

## 2023-05-27 MED ORDER — CIPROFLOXACIN HCL 500 MG PO TABS: 500.0000 mg | ORAL_TABLET | Freq: Two times a day (BID) | ORAL | 0 refills | Status: AC

## 2023-05-27 NOTE — TOC CM/SW Note (Signed)
Transition of Care [facility]) - Inpatient Brief Assessment  Patient Details  Name: Zachary [name] MRN: [id number] Date of Birth: 1951/03/01  Transition of Care [facility]) CM/SW Contact:    Ewing Schlein, LCSW Phone Number: 05/27/2023, 8:30 AM  Clinical Narrative: Patient has been screened and there are no identified needs at this time.  Transition of Care Asessment: Insurance and Status: Insurance coverage has been reviewed Patient has primary care physician: Yes Home environment has been reviewed: Yes Prior level of function:: Independent at baseline Prior/Current Home Services: No current home services Social Determinants of Health Reivew: SDOH reviewed no interventions necessary Readmission risk has been reviewed: Yes Transition of care needs: no transition of care needs at this time

## 2023-05-27 NOTE — Discharge Instructions (Addendum)
Please take all prescribed medications exactly as instructed including the remaining course of antibiotics including ciprofloxacin and Flagyl. Please consume a low-sodium low-fat diet that has adequate fiber.  More details about eating instructions in the setting of diverticulitis are included in your discharge packet.   Please increase your physical activity as tolerated. Please maintain all outpatient follow-up appointments including follow-up with your primary care provider.  Please also make a follow-up appointment with our colorectal surgery clinic and request to see either Drs. White, Thomas or Gross for further evaluation of surgical options in managing your recurrent diverticulitis.   Please return to the emergency department if you develop worsening abdominal pain, fevers in excess of 100.4 F, weakness or inability to tolerate oral intake.

## 2023-05-27 NOTE — Care Management Important Message (Signed)
Important Message  Patient Details IM Letter mailed due to Discharged. Name: Zachary [name] MRN: [id number] Date of Birth: 30-Dec-1950   Medicare Important Message Given:  Other (see comment)     Caren Macadam 05/27/2023, 3:51 PM

## 2023-05-27 NOTE — Progress Notes (Signed)
Discharge instructions discussed with patient and wife, verbalized agreement and understanding  

## 2023-05-27 NOTE — Discharge Summary (Addendum)
Physician Discharge Summary   Patient: Zachary [name] MRN: [id number] DOB: August 14, 1951  Admit date:     05/23/2023  Discharge date: 05/27/23  Discharge Physician: Marinda Elk   PCP: Sharlene Dory, DO   Recommendations at discharge:   Please take all prescribed medications exactly as instructed including the remaining course of antibiotics including ciprofloxacin and Flagyl. Please consume a low-sodium low-fat diet that has adequate fiber.  More details about eating instructions in the setting of diverticulitis are included in your discharge packet.   Please increase your physical activity as tolerated. Please maintain all outpatient follow-up appointments including follow-up with your primary care provider.  Please also make a follow-up appointment with our colorectal surgery clinic and request to see either Drs. White, Thomas or Gross for further evaluation of surgical options in managing your recurrent diverticulitis.   Please return to the emergency department if you develop worsening abdominal pain, fevers in excess of 100.4 F, weakness or inability to tolerate oral intake.  Discharge Diagnoses: Principal Problem:   Acute diverticulitis Active Problems:   Coronary artery disease involving autologous artery coronary bypass graft with angina pectoris (HCC)   OSA (obstructive sleep apnea)   Cough   Essential hypertension   GERD without esophagitis   Hypothyroidism   Mixed hyperlipidemia   Dyslipidemia (high LDL; low HDL)   Prediabetes  Resolved Problems:   * No resolved hospital problems. *   Hospital Course: 72 y.o. male with medical history significant of hyperlipidemia, GERD, hypertension, hypothyroidism, treated prostate cancer in remission, sleep apnea on CPAP, history of CVA/TIA,  who presented to the emergency department with complaints of abdominal pain and 6-7 daily episodes of diarrhea for the past 3 days.    Upon evaluation in the emergency  department CT imaging of the abdomen pelvis revealed proximal sigmoid diverticulitis without abscess or perforation.  The hospitalist group was then called to assess the patient for admission to the hospital.    Patient was admitted to hospital service, initiated on intravenous fluids and intravenous antibiotics with Zosyn.  Pain was managed with as needed opiate-based analgesics throughout the hospitalization.  Recovery was slow going but gradual.  Diet was advanced slowly and tolerated well.  It is also worth noting that during this hospitalization the patient was noted to have a chronic cough.  This cough has been going on for at least 1 to 2 months and has been managed with antibiotics in the outpatient setting.  Patient's lisinopril was discontinued and patient's cough quickly improved the following 24 to 48 hours.  It is felt that the patient's chronic cough is likely secondary to ACE inhibitor use.  Patient's blood pressures have been normotensive throughout the remainder of the hospitalization and an alternative antihypertensive has not been initiated in its place.  Considering this was the patient's eighth episode of diverticulitis, the case was discussed with surgery.  It was recommended that the patient seek a follow-up appointment with colorectal surgery in the outpatient setting in the coming weeks for outpatient evaluation of potential surgical resection.  Patient was provided with contact information to schedule this appointment at time of discharge.  The patient clinically improved and was discharged home in improved and stable condition 05/27/2023 with a remaining course of antibiotic therapy with ciprofloxacin and Flagyl to complete 14 days.    Pain control - [employer] Controlled Substance Reporting System database was reviewed. and patient was instructed, not to drive, operate heavy machinery, perform activities at heights, swimming or  participation in water activities or provide  baby-sitting services while on Pain, Sleep and Anxiety Medications; until their outpatient Physician has advised to do so again. Also recommended to not to take more than prescribed Pain, Sleep and Anxiety Medications.   Consultants: None Procedures performed: None  Disposition: Home Diet recommendation:  Discharge Diet Orders (From admission, onward)     Start     Ordered   05/27/23 0000  Diet - low sodium heart healthy        05/27/23 1441           Cardiac diet  DISCHARGE MEDICATION: Allergies as of 05/27/2023       Reactions   Oxycodone Itching   Atorvastatin Other (See Comments)   Muscle pain   Mobic [meloxicam] Other (See Comments)   Severe diarrhea    Simvastatin Other (See Comments)   Muscle pain        Medication List     STOP taking these medications    amoxicillin-clavulanate 875-125 MG tablet Commonly known as: AUGMENTIN   lisinopril 5 MG tablet Commonly known as: ZESTRIL   tiZANidine 4 MG tablet Commonly known as: Zanaflex       TAKE these medications    aspirin EC 81 MG tablet Take 81 mg by mouth daily.   ciprofloxacin 500 MG tablet Commonly known as: Cipro Take 1 tablet (500 mg total) by mouth 2 (two) times daily for 9 days.   dicyclomine 10 MG capsule Commonly known as: BENTYL TAKE 1 CAPSULE BY MOUTH EVERY 6 HOURS AS NEEDED FOR  ABDOMINAL  CRAMPING What changed:  how much to take how to take this when to take this reasons to take this additional instructions   fluticasone 50 MCG/ACT nasal spray Commonly known as: FLONASE Place 2 sprays into both nostrils daily. What changed:  when to take this reasons to take this   HYDROcodone-acetaminophen 5-325 MG tablet Commonly known as: NORCO/VICODIN Take 1 tablet by mouth every 6 (six) hours as needed for up to 3 days for severe pain.   levocetirizine 5 MG tablet Commonly known as: XYZAL Take 1 tablet (5 mg total) by mouth every evening. What changed:  when to take this reasons  to take this   levothyroxine 100 MCG tablet Commonly known as: SYNTHROID Take 1 tablet by mouth once daily   loratadine 10 MG tablet Commonly known as: CLARITIN Take 10 mg by mouth daily.   melatonin 5 MG Tabs Take 5 mg by mouth at bedtime as needed (sleep).   metroNIDAZOLE 500 MG tablet Commonly known as: Flagyl Take 1 tablet (500 mg total) by mouth 2 (two) times daily for 9 days.   montelukast 10 MG tablet Commonly known as: SINGULAIR Take 1 tablet (10 mg total) by mouth at bedtime. What changed:  when to take this reasons to take this   omeprazole 20 MG capsule Commonly known as: PRILOSEC Take 1 capsule by mouth once daily   pravastatin 80 MG tablet Commonly known as: PRAVACHOL Take 1 tablet by mouth once daily What changed: when to take this   saccharomyces boulardii 250 MG capsule Commonly known as: FLORASTOR Take 250 mg by mouth 2 (two) times daily.   sodium bicarbonate 650 MG tablet Take 1 tablet by mouth once daily        Follow-up Information     Sharlene Dory, DO. Schedule an appointment as soon as possible for a visit in 1 week(s).   Specialty: Family Medicine Contact information: [address] [place] [address] [phone]         Andria Meuse, MD Follow up.   Specialties: General Surgery, Colon and Rectal Surgery Why: Make an appointment to see Drs. Modesto Charon or [employer], our colorectal surgeons. Contact information: [address] [place] [address] [place] [address] [phone]                 Discharge Exam: Ceasar Mons Weights   05/23/23 0725  Weight: 96.2 kg    Constitutional: Awake alert and oriented x3, no associated distress.   Respiratory: clear to auscultation bilaterally, no wheezing, no crackles. Normal respiratory effort. No accessory muscle use.  Cardiovascular: Regular rate and rhythm, no murmurs / rubs / gallops. No extremity edema. 2+ pedal pulses. No carotid bruits.   Abdomen: Mild left lower quadrant tenderness.  Abdomen is soft.  No evidence of intra-abdominal masses.  Positive bowel sounds noted in all quadrants.   Musculoskeletal: No joint deformity upper and lower extremities. Good ROM, no contractures. Normal muscle tone.     Condition at discharge: fair  The results of significant diagnostics from this hospitalization (including imaging, microbiology, ancillary and laboratory) are listed below for reference.   Imaging Studies: DG Chest 1 View  Result Date: 05/25/2023 CLINICAL DATA:  Cough EXAM: CHEST  1 VIEW COMPARISON:  None Available. FINDINGS: Transverse diameter of heart is increased. There are no signs of pulmonary edema or focal pulmonary consolidation. Thin linear density in medial left lower lung fields suggest minimal scarring or subsegmental atelectasis. Left lateral CP angle is indistinct. There is no pneumothorax. Degenerative changes are noted in the left shoulder. IMPRESSION: There are no focal infiltrates or signs of pulmonary edema. Electronically Signed   By: Ernie Avena M.D.   On: 05/25/2023 13:01   CT ABDOMEN PELVIS W CONTRAST  Result Date: 05/23/2023 CLINICAL DATA:  Abdominal pain. EXAM: CT ABDOMEN AND PELVIS WITH CONTRAST TECHNIQUE: Multidetector CT imaging of the abdomen and pelvis was performed using the standard protocol following bolus administration of intravenous contrast. RADIATION DOSE REDUCTION: This exam was performed according to the departmental dose-optimization program which includes automated exposure control, adjustment of the mA and/or kV according to patient size and/or use of iterative reconstruction technique. CONTRAST:  OMNIPAQUE IOHEXOL 300 MG/ML  SOLN COMPARISON:  November 13, 2021. FINDINGS: Lower chest: No acute abnormality. Hepatobiliary: No focal liver abnormality is seen. No gallstones, gallbladder wall thickening, or biliary dilatation. Pancreas: Unremarkable. No pancreatic ductal dilatation or  surrounding inflammatory changes. Spleen: Normal in size without focal abnormality. Adrenals/Urinary Tract: Adrenal glands appear normal. Bilateral parapelvic cysts are noted. No hydronephrosis or renal obstruction is noted. Urinary bladder is unremarkable. Stomach/Bowel: Stomach is unremarkable. The appendix appears normal. There is no evidence of bowel obstruction. Diverticulitis of proximal sigmoid colon is noted without abscess formation. Vascular/Lymphatic: Aortic atherosclerosis. No enlarged abdominal or pelvic lymph nodes. Reproductive: Patient appears to be status post prostatectomy. Other: No abdominal wall hernia or abnormality. No abdominopelvic ascites. Musculoskeletal: No acute or significant osseous findings. IMPRESSION: Diverticulitis of proximal sigmoid colon is noted without abscess formation. Aortic Atherosclerosis (ICD10-I70.0). Electronically Signed   By: Lupita Raider M.D.   On: 05/23/2023 08:44    Microbiology: Results for orders placed or performed during the hospital encounter of 01/15/21  SARS CORONAVIRUS 2 (TAT 6-24 HRS) Nasopharyngeal Nasopharyngeal Swab     Status: None   Collection Time: 01/15/21  7:54 PM   Specimen: Nasopharyngeal Swab  Result Value  Ref Range Status   SARS Coronavirus 2 NEGATIVE NEGATIVE Final    Comment: (NOTE) SARS-CoV-2 target nucleic acids are NOT DETECTED.  The SARS-CoV-2 RNA is generally detectable in upper and lower respiratory specimens during the acute phase of infection. Negative results do not preclude SARS-CoV-2 infection, do not rule out co-infections with other pathogens, and should not be used as the sole basis for treatment or other patient management decisions. Negative results must be combined with clinical observations, patient history, and epidemiological information. The expected result is Negative.  Fact Sheet for Patients: HairSlick.no  Fact Sheet for Healthcare  Providers: quierodirigir.com  This test is not yet approved or cleared by the [place] FDA and  has been authorized for detection and/or diagnosis of SARS-CoV-2 by FDA under an Emergency Use Authorization (EUA). This EUA will remain  in effect (meaning this test can be used) for the duration of the COVID-19 declaration under Se ction 564(b)(1) of the Act, 21 U.S.C. section 360bbb-3(b)(1), unless the authorization is terminated or revoked sooner.  Performed at [facility], [address] [place], [place] [address]   MRSA PCR Screening     Status: None   Collection Time: 01/16/21  7:58 AM   Specimen: Nasopharyngeal  Result Value Ref Range Status   MRSA by PCR NEGATIVE NEGATIVE Final    Comment:        The GeneXpert MRSA Assay (FDA approved for NASAL specimens only), is one component of a comprehensive MRSA colonization surveillance program. It is not intended to diagnose MRSA infection nor to guide or monitor treatment for MRSA infections. Performed at [facility], [address] [place], [place] [address]     Labs: CBC: Recent Labs  Lab 05/23/23 [phone] 05/24/23 0531 05/25/23 0429 05/26/23 0443 05/27/23 0438  WBC 13.8* 9.4 7.1 6.0 6.2  NEUTROABS 9.1*  --  4.0 3.0 3.4  HGB 15.4 14.0 13.5 13.5 14.2  HCT 45.0 41.5 40.4 40.5 42.1  MCV 91.5 95.2 95.5 93.3 92.9  PLT 271 218 205 219 231   Basic Metabolic Panel: Recent Labs  Lab 05/23/23 0729 05/24/23 0531 05/25/23 0429 05/26/23 0443 05/27/23 0438  NA 134* 135 135 134* 136  K 3.9 4.2 3.9 3.9 3.8  CL 99 103 105 101 103  CO2 26 24 24 26 25   GLUCOSE 111* 98 94 96 104*  BUN 15 11 8 9 9   CREATININE 1.05 1.12 1.03 1.22 1.18  CALCIUM 9.3 8.5* 8.6* 8.9 9.3  MG  --   --  1.7 1.9 1.9  PHOS  --   --   --  3.0  --    Liver Function Tests: Recent Labs  Lab 05/23/23 0729 05/25/23 0429 05/26/23 0443 05/27/23 0438  AST 23 16 23 26   ALT 35 25 32 38  ALKPHOS 54 38 35* 40   BILITOT 0.6 0.7 0.7 0.8  PROT 7.4 6.1* 6.5 7.0  ALBUMIN 4.0 3.4* 3.5 3.6   CBG: Recent Labs  Lab 05/25/23 0729 05/25/23 1152 05/25/23 1644 05/25/23 2026 05/26/23 0727  GLUCAP 84 84 82 109* 79    Discharge time spent: greater than 30 minutes.  Signed: Marinda Elk, MD [facility] 05/27/2023

## 2023-05-27 NOTE — Progress Notes (Signed)
   05/26/23 2300  BiPAP/CPAP/SIPAP  BiPAP/CPAP/SIPAP Resmed (home machine)  Mask Type Full face mask  Respiratory Rate 18 breaths/min  FiO2 (%) 21 %  Patient Home Equipment Yes  Auto Titrate No

## 2023-05-28 ENCOUNTER — Encounter: Payer: Self-pay | Admitting: Family Medicine

## 2023-05-28 ENCOUNTER — Telehealth: Payer: Self-pay | Admitting: *Deleted

## 2023-05-28 ENCOUNTER — Other Ambulatory Visit: Payer: Self-pay | Admitting: Family Medicine

## 2023-05-28 ENCOUNTER — Encounter: Payer: Self-pay | Admitting: *Deleted

## 2023-05-28 DIAGNOSIS — K5792 Diverticulitis of intestine, part unspecified, without perforation or abscess without bleeding: Secondary | ICD-10-CM

## 2023-05-28 NOTE — Transitions of Care (Post Inpatient/ED Visit) (Signed)
05/28/2023  Name: Zachary [name] MRN: [id number] DOB: [birth year]  Today's TOC FU Call Status: Today's TOC FU Call Status:: Successful TOC FU Call Competed TOC FU Call Complete Date: 05/28/23  Transition Care Management Follow-up Telephone Call Date of Discharge: 05/27/23 Discharge Facility: [facility]) Type of Discharge: Inpatient Admission Primary Inpatient Discharge Diagnosis:: Acute diverticulitis How have you been since you were released from the hospital?: Better ("Doing better; taking the medicines as prescribed; not having any problems.  Will talk to Dr. Carmelia Roller about whether the probiotics should be stopped like the hospital nurse told us to-- she didn't say why, just said not to take with Flagyl") Any questions or concerns?: Yes Patient Questions/Concerns:: Patient and his spouse question why they were told not to take probiotics when on flagyl- also question if they need new referral for GI surgeon post-hospital discharge; verbalize proactive plans to discuss with PCP at time of upcoming HFU office visit on 06/02/23 Patient Questions/Concerns Addressed: Other: (encouraged patient to discuss with PCP as planned; facilitated scheduling PCP office visit within 6 days of discharge)  Items Reviewed: Did you receive and understand the discharge instructions provided?: Yes (thoroughly reviewed with patient who verbalizes good understanding of same) Medications obtained,verified, and reconciled?: Yes (Medications Reviewed) (Full medication reconciliation/ review completed; no concerns or discrepancies identified; confirmed patient obtained/ is taking all newly Rx'd medications as instructed; spouse-manages medications and denies questions/ concerns around medications today) Any new allergies since your discharge?: No Dietary orders reviewed?: Yes Type of Diet Ordered:: low salt; low fat; soft diet Do you have support at home?: Yes People in Home: spouse Name of Support/Comfort  Primary Source: Reports independent in self-care activities; supportive spouse assists as/ if needed/ indicated  Medications Reviewed Today: Medications Reviewed Today     Reviewed by Michaela Corner, RN (Registered Nurse) on 05/28/23 at 1432  Med List Status: <None>   Medication Order Taking? Sig Documenting Provider Last Dose Status Informant  aspirin EC 81 MG EC tablet [id number] Yes Take 81 mg by mouth daily. [provider] Taking Active Spouse/Significant Other, Pharmacy Records  ciprofloxacin (CIPRO) 500 MG tablet [id number] Yes Take 1 tablet (500 mg total) by mouth 2 (two) times daily for 9 days. Shalhoub, Deno Lunger, MD Taking Active   dicyclomine (BENTYL) 10 MG capsule [id number] Yes TAKE 1 CAPSULE BY MOUTH EVERY 6 HOURS AS NEEDED FOR  ABDOMINAL  CRAMPING  Patient taking differently: Take 10 mg by mouth every 6 (six) hours as needed for spasms.   Sharlene Dory, DO Taking Active Spouse/Significant Other, Pharmacy Records  fluticasone [facility]) 50 MCG/ACT nasal spray [id number] Yes Place 2 sprays into both nostrils daily.  Patient taking differently: Place 2 sprays into both nostrils as needed for allergies.   Sharlene Dory, DO Taking Active Spouse/Significant Other, Pharmacy Records  HYDROcodone-acetaminophen (NORCO/VICODIN) 5-325 MG tablet [id number] Yes Take 1 tablet by mouth every 6 (six) hours as needed for up to 3 days for severe pain. Shalhoub, Deno Lunger, MD Taking Active   levocetirizine (XYZAL) 5 MG tablet [id number] Yes Take 1 tablet (5 mg total) by mouth every evening.  Patient taking differently: Take 5 mg by mouth at bedtime as needed for allergies.   Sharlene Dory, DO Taking Active Spouse/Significant Other, Pharmacy Records  levothyroxine (SYNTHROID) 100 MCG tablet [id number] Yes Take 1 tablet by mouth once daily  Patient taking differently: Take 100 mcg by mouth daily.   Sharlene Dory, DO Taking Active Spouse/Significant Other,  Pharmacy Records  loratadine (CLARITIN) 10 MG tablet [id number] Yes Take 10 mg by mouth daily. [provider] Taking Active Spouse/Significant Other, Pharmacy Records  melatonin 5 MG TABS [id number] Yes Take 5 mg by mouth at bedtime as needed (sleep). [provider] Taking Active Spouse/Significant Other, Pharmacy Records  metroNIDAZOLE (FLAGYL) 500 MG tablet [id number] Yes Take 1 tablet (500 mg total) by mouth 2 (two) times daily for 9 days. Shalhoub, Deno Lunger, MD Taking Active   montelukast (SINGULAIR) 10 MG tablet [id number] Yes Take 1 tablet (10 mg total) by mouth at bedtime.  Patient taking differently: Take 10 mg by mouth at bedtime as needed (allergies).   Sharlene Dory, DO Taking Active Spouse/Significant Other, Pharmacy Records  omeprazole (PRILOSEC) 20 MG capsule [id number] Yes Take 1 capsule by mouth once daily  Patient taking differently: Take 20 mg by mouth daily.   Sharlene Dory, DO Taking Active Spouse/Significant Other, Pharmacy Records  pravastatin (PRAVACHOL) 80 MG tablet [id number] Yes Take 1 tablet by mouth once daily  Patient taking differently: Take 80 mg by mouth every evening.   Sharlene Dory, DO Taking Active Spouse/Significant Other, Pharmacy Records  saccharomyces boulardii (FLORASTOR) 250 MG capsule [id number] Yes Take 250 mg by mouth 2 (two) times daily. [provider] Taking Active Spouse/Significant Other, Pharmacy Records           Med Note Otelia Limes May 28, 2023  2:29 PM) 05/28/23: reports during [facility] call on 05/28/23 was told to hold until Flagyl is completed and then to re-start  sodium bicarbonate 650 MG tablet [id number] Yes Take 1 tablet by mouth once daily  Patient taking differently: Take 650 mg by mouth daily.   Sharlene Dory, DO Taking Active Spouse/Significant Other, Pharmacy Records            Home Care and Equipment/Supplies: Were Home Health Services Ordered?: No Any new  equipment or medical supplies ordered?: No  Functional Questionnaire: Do you need assistance with bathing/showering or dressing?: No Do you need assistance with meal preparation?: No Do you need assistance with eating?: No Do you have difficulty maintaining continence: No Do you need assistance with getting out of bed/getting out of a chair/moving?: No Do you have difficulty managing or taking your medications?: Yes (spouse manages medications)  Follow up appointments reviewed: PCP Follow-up appointment confirmed?: Yes Date of PCP follow-up appointment?: 06/02/23 Follow-up Provider: PCP Specialist Hospital Follow-up appointment confirmed?: No Reason Specialist Follow-Up Not Confirmed: Patient has Specialist Provider Number and will Call for Appointment (have already contacted specialist and were told they need new referral placed- to discuss with PCP possible need for new referral on 06/02/23) Do you need transportation to your follow-up appointment?: No Do you understand care options if your condition(s) worsen?: Yes-patient verbalized understanding  SDOH Interventions Today    Flowsheet Row Most Recent Value  SDOH Interventions   Food Insecurity Interventions Intervention Not Indicated  Transportation Interventions Intervention Not Indicated  [normally drives self,  spouse assisting with transportation after recent hospitalization]      TOC Interventions Today    Flowsheet Row Most Recent Value  TOC Interventions   TOC Interventions Discussed/Reviewed TOC Interventions Discussed, Arranged PCP follow up within 7 days/Care Guide scheduled      Interventions Today    Flowsheet Row Most Recent Value  Chronic Disease   Chronic disease during today's visit Other  [Acute diverticulitis]  General Interventions   General Interventions Discussed/Reviewed General Interventions Discussed, Doctor  Visits, Durable Medical Equipment (DME)  Doctor Visits Discussed/Reviewed Specialist,  Doctor Visits Discussed, PCP  Durable Medical Equipment (DME) Other  [confirmed not currently requiring assistive devices]  PCP/Specialist Visits Compliance with follow-up visit  Education Interventions   Education Provided Provided Education  Provided Verbal Education On Nutrition  [diet for diverticulitis]  Nutrition Interventions   Nutrition Discussed/Reviewed Nutrition Discussed, Decreasing fats, Decreasing salt  Pharmacy Interventions   Pharmacy Dicussed/Reviewed Pharmacy Topics Discussed  [Full medication review with updating medication list in EHR per patient report]      Caryl Pina, RN, BSN, CCRN Alumnus RN CM Care Coordination/ Transition of Care- [facility] Care Management [phone]: direct office

## 2023-06-02 ENCOUNTER — Ambulatory Visit (INDEPENDENT_AMBULATORY_CARE_PROVIDER_SITE_OTHER): Payer: Medicare Other | Admitting: Family Medicine

## 2023-06-02 ENCOUNTER — Encounter: Payer: Self-pay | Admitting: Family Medicine

## 2023-06-02 VITALS — BP 124/80 | HR 64 | Temp 98.3°F | Ht 68.0 in | Wt 206.2 lb

## 2023-06-02 DIAGNOSIS — K5792 Diverticulitis of intestine, part unspecified, without perforation or abscess without bleeding: Secondary | ICD-10-CM

## 2023-06-02 NOTE — Patient Instructions (Signed)
Let us know if you need anything.  

## 2023-06-02 NOTE — Progress Notes (Signed)
Chief Complaint  Patient presents with   Hospitalization Follow-up    Subjective: Patient is a 72 y.o. male here for hosp f/u. Here w his wife.   Patient was admitted on 5/31 and discharged on 05/27/2023 from [facility] for acute diverticulitis.  He is currently taking ciprofloxacin and metronidazole for another 3 days.  He reports compliance and the only adverse effect is diarrhea.  He is not having blood in his stool, fevers, and his abdominal pain is steadily improving.  He is no longer requiring opiates and just using Tylenol.  He has had several bouts of diverticulitis in the last 5 years.  He has never had a perforation or an abscess.  He has an appointment with the general surgery team in the beginning of July.  He has been adherent to a soft diet and his wife is requesting some information on this.  Past Medical History:  Diagnosis Date   Arthritis    Articular cartilage disease    left shoulder   Diverticulitis    Dyslipidemia (high LDL; low HDL) 08/15/2016   GERD (gastroesophageal reflux disease)    Hearing problem    hearing deficit   Hemorrhoids    History of back surgery    Hypertension    Hypothyroidism    Prostate cancer (HCC) 2015   treated and in remission   Sleep apnea    uses a cpap   Stroke (HCC)    TIA -affected balance and went through PT.   Wears glasses    Wears hearing aid    both ears    Objective: BP 124/80 (BP Location: Left Arm, Patient Position: Sitting, Cuff Size: Normal)   Pulse 64   Temp 98.3 F (36.8 C) (Oral)   Ht 5\' 8"  (1.727 m)   Wt 206 lb 4 oz (93.6 kg)   SpO2 93%   BMI 31.36 kg/m  General: Awake, appears stated age Heart: RRR, no LE edema Mouth: MMM Lungs: CTAB, no rales, wheezes or rhonchi. No accessory muscle use Abd: BS+, S, mild lower quadrant TTP, mild distention Psych: Age appropriate judgment and insight, normal affect and mood  Assessment and Plan: Diverticulitis  Discussed and reiterated that there is no  diet leading to lower flares of diverticulitis.  I did suggest that if he notices a specific food seems to flare his symptoms, simply avoid that food.  Information on soft diet is provided today.  Appreciate the general surgery team who will see him on July 1.  Reinforced the fact that the patient is not diabetic despite frequent glucose checks in the hospital. The patient and his wife voiced understanding and agreement to the plan.  I spent 30 minutes with the patient and his wife discussing the above in addition to reviewing his chart on the same day of the visit.  Jilda Roche [place], DO 06/02/23  11:55 AM

## 2023-06-05 ENCOUNTER — Other Ambulatory Visit: Payer: Self-pay | Admitting: Family Medicine

## 2023-06-09 ENCOUNTER — Other Ambulatory Visit: Payer: Self-pay | Admitting: Family Medicine

## 2023-06-23 DIAGNOSIS — K5732 Diverticulitis of large intestine without perforation or abscess without bleeding: Secondary | ICD-10-CM | POA: Diagnosis not present

## 2023-06-24 DIAGNOSIS — B351 Tinea unguium: Secondary | ICD-10-CM | POA: Diagnosis not present

## 2023-06-24 DIAGNOSIS — L600 Ingrowing nail: Secondary | ICD-10-CM | POA: Diagnosis not present

## 2023-06-28 ENCOUNTER — Other Ambulatory Visit: Payer: Self-pay | Admitting: Family Medicine

## 2023-06-30 ENCOUNTER — Other Ambulatory Visit: Payer: Self-pay | Admitting: Family Medicine

## 2023-06-30 ENCOUNTER — Encounter: Payer: Self-pay | Admitting: Family Medicine

## 2023-07-03 DIAGNOSIS — Z9889 Other specified postprocedural states: Secondary | ICD-10-CM | POA: Diagnosis not present

## 2023-07-10 ENCOUNTER — Other Ambulatory Visit: Payer: Self-pay | Admitting: Family Medicine

## 2023-07-10 DIAGNOSIS — K219 Gastro-esophageal reflux disease without esophagitis: Secondary | ICD-10-CM

## 2023-07-14 NOTE — Progress Notes (Signed)
Sent message, via epic in basket, requesting orders in epic from surgeon.  

## 2023-07-15 ENCOUNTER — Ambulatory Visit: Payer: Self-pay | Admitting: Surgery

## 2023-07-15 DIAGNOSIS — Z01818 Encounter for other preprocedural examination: Secondary | ICD-10-CM

## 2023-07-16 NOTE — Progress Notes (Addendum)
Anesthesia Review:  PCP: Arva Chafe LOV 06/02/23  Cardiologist : none  Chest x-ray : 1v-05/25/23  EKG : 05/23/23  Echo : 2022  Stress test: Cardiac Cath :  2022  Activity level: can do a flight of stairs without difficutly  Sleep Study/ CPAP : has cpap  Fasting Blood Sugar :      / Checks Blood Sugar -- times a day:   Blood Thinner/ Instructions /Last Dose: ASA / Instructions/ Last Dose :    PT off blood pressure meds since hospitalist took him off 05/2023 per wife and wife reports at preop that blood pressure has been " good" at home.

## 2023-07-16 NOTE — Patient Instructions (Addendum)
SURGICAL WAITING ROOM VISITATION  Patients having surgery or a procedure may have no more than 2 support people in the waiting area - these visitors may rotate.    Children under the age of 71 must have an adult with them who is not the patient.  Due to an increase in RSV and influenza rates and associated hospitalizations, children ages 62 and under may not visit patients in [facility] hospitals.  If the patient needs to stay at the hospital during part of their recovery, the visitor guidelines for inpatient rooms apply. Pre-op nurse will coordinate an appropriate time for 1 support person to accompany patient in pre-op.  This support person may not rotate.    Please refer to the [facility] website for the visitor guidelines for Inpatients (after your surgery is over and you are in a regular room).       Your procedure is scheduled on:  07/31/23    Report to [facility] Main Entrance    Report to admitting at  0900 AM   Call this number if you have problems the morning of surgery [phone]   Clear liquid diet on day of bowel prep.     After Midnight you may have the following liquids until ___ 0800___ AM  DAY OF SURGERY  Water Non-Citrus Juices (without pulp, NO RED-Apple, White grape, White cranberry) Black Coffee (NO MILK/CREAM OR CREAMERS, sugar ok)  Clear Tea (NO MILK/CREAM OR CREAMERS, sugar ok) regular and decaf                             Plain Jell-O (NO RED)                                           Fruit ices (not with fruit pulp, NO RED)                                     Popsicles (NO RED)                                                               Sports drinks like Gatorade (NO RED)              Drink 2 Ensure/G2 drinks AT 10:00 PM the night before surgery.        The day of surgery:  Drink ONE (1) Pre-Surgery Clear Ensure or G2 at   0800AM ( have completed by )  the morning of surgery. Drink in one sitting. Do not sip.  This drink was given to  you during your hospital  pre-op appointment visit. Nothing else to drink after completing the  Pre-Surgery Clear Ensure or G2.          If you have questions, please contact your surgeon's office.   FOLLOW BOWEL PREP AND ANY ADDITIONAL PRE OP INSTRUCTIONS YOU RECEIVED FROM YOUR SURGEON'S OFFICE!!!     Oral Hygiene is also important to reduce your risk of infection.  Remember - BRUSH YOUR TEETH THE MORNING OF SURGERY WITH YOUR REGULAR TOOTHPASTE  DENTURES WILL BE REMOVED PRIOR TO SURGERY PLEASE DO NOT APPLY "Poly grip" OR ADHESIVES!!!   Do NOT smoke after Midnight   Take these medicines the morning of surgery with A SIP OF WATER: flonase, synthroid, claritin, omeprazole Sodium bicarbonate   DO NOT TAKE ANY ORAL DIABETIC MEDICATIONS DAY OF YOUR SURGERY  Bring CPAP mask and tubing day of surgery.                              You may not have any metal on your body including hair pins, jewelry, and body piercing             Do not wear make-up, lotions, powders, perfumes/cologne, or deodorant  Do not wear nail polish including gel and S&S, artificial/acrylic nails, or any other type of covering on natural nails including finger and toenails. If you have artificial nails, gel coating, etc. that needs to be removed by a nail salon please have this removed prior to surgery or surgery may need to be canceled/ delayed if the surgeon/ anesthesia feels like they are unable to be safely monitored.   Do not shave  48 hours prior to surgery.               Men may shave face and neck.   Do not bring valuables to the hospital. [facility] IS NOT             RESPONSIBLE   FOR VALUABLES.   Contacts, glasses, dentures or bridgework may not be worn into surgery.   Bring small overnight bag day of surgery.   DO NOT BRING YOUR HOME MEDICATIONS TO THE HOSPITAL. PHARMACY WILL DISPENSE MEDICATIONS LISTED ON YOUR MEDICATION LIST TO YOU DURING YOUR ADMISSION IN THE  HOSPITAL!    Patients discharged on the day of surgery will not be allowed to drive home.  Someone NEEDS to stay with you for the first 24 hours after anesthesia.   Special Instructions: Bring a copy of your healthcare power of attorney and living will documents the day of surgery if you haven't scanned them before.              Please read over the following fact sheets you were given: IF YOU HAVE QUESTIONS ABOUT YOUR PRE-OP INSTRUCTIONS PLEASE CALL [phone]   If you received a COVID test during your pre-op visit  it is requested that you wear a mask when out in public, stay away from anyone that may not be feeling well and notify your surgeon if you develop symptoms. If you test positive for Covid or have been in contact with anyone that has tested positive in the last 10 days please notify you surgeon.    [facility] - Preparing for Surgery Before surgery, you can play an important role.  Because skin is not sterile, your skin needs to be as free of germs as possible.  You can reduce the number of germs on your skin by washing with CHG (chlorahexidine gluconate) soap before surgery.  CHG is an antiseptic cleaner which kills germs and bonds with the skin to continue killing germs even after washing. Please DO NOT use if you have an allergy to CHG or antibacterial soaps.  If your skin becomes reddened/irritated stop using the CHG and inform your nurse when you arrive at Short Stay. Do not shave (including  legs and underarms) for at least 48 hours prior to the first CHG shower.  You may shave your face/neck. Please follow these instructions carefully:  1.  Shower with CHG Soap the night before surgery and the  morning of Surgery.  2.  If you choose to wash your hair, wash your hair first as usual with your  normal  shampoo.  3.  After you shampoo, rinse your hair and body thoroughly to remove the  shampoo.                           4.  Use CHG as you would any other liquid soap.  You can apply  chg directly  to the skin and wash                       Gently with a scrungie or clean washcloth.  5.  Apply the CHG Soap to your body ONLY FROM THE NECK DOWN.   Do not use on face/ open                           Wound or open sores. Avoid contact with eyes, ears mouth and genitals (private parts).                       Wash face,  Genitals (private parts) with your normal soap.             6.  Wash thoroughly, paying special attention to the area where your surgery  will be performed.  7.  Thoroughly rinse your body with warm water from the neck down.  8.  DO NOT shower/wash with your normal soap after using and rinsing off  the CHG Soap.                9.  Pat yourself dry with a clean towel.            10.  Wear clean pajamas.            11.  Place clean sheets on your bed the night of your first shower and do not  sleep with pets. Day of Surgery : Do not apply any lotions/deodorants the morning of surgery.  Please wear clean clothes to the hospital/surgery center.  FAILURE TO FOLLOW THESE INSTRUCTIONS MAY RESULT IN THE CANCELLATION OF YOUR SURGERY PATIENT SIGNATURE_________________________________  NURSE SIGNATURE__________________________________  ________________________________________________________________________

## 2023-07-21 ENCOUNTER — Encounter (HOSPITAL_COMMUNITY): Payer: Self-pay

## 2023-07-21 ENCOUNTER — Other Ambulatory Visit: Payer: Self-pay

## 2023-07-21 ENCOUNTER — Encounter (HOSPITAL_COMMUNITY)
Admission: RE | Admit: 2023-07-21 | Discharge: 2023-07-21 | Disposition: A | Discharge status: Home / Self Care | Payer: Medicare Other | Source: Ambulatory Visit | Attending: Surgery | Admitting: Surgery

## 2023-07-21 DIAGNOSIS — Z01818 Encounter for other preprocedural examination: Secondary | ICD-10-CM

## 2023-07-21 DIAGNOSIS — Z01812 Encounter for preprocedural laboratory examination: Secondary | ICD-10-CM | POA: Diagnosis not present

## 2023-07-21 HISTORY — DX: Sclerosing mesenteritis: K65.4

## 2023-07-21 LAB — TYPE AND SCREEN
ABO/RH(D): B NEG
Antibody Screen: NEGATIVE

## 2023-07-21 LAB — CBC
HCT: 44.7 % (ref 39.0–52.0)
Hemoglobin: 14.9 g/dL (ref 13.0–17.0)
MCH: 32 pg (ref 26.0–34.0)
MCHC: 33.3 g/dL (ref 30.0–36.0)
MCV: 96.1 fL (ref 80.0–100.0)
Platelets: 225 10*3/uL (ref 150–400)
RBC: 4.65 MIL/uL (ref 4.22–5.81)
RDW: 13.2 % (ref 11.5–15.5)
WBC: 6.3 10*3/uL (ref 4.0–10.5)
nRBC: 0 % (ref 0.0–0.2)

## 2023-07-21 LAB — BASIC METABOLIC PANEL
Anion gap: 7 (ref 5–15)
BUN: 15 mg/dL (ref 8–23)
CO2: 27 mmol/L (ref 22–32)
Calcium: 9.4 mg/dL (ref 8.9–10.3)
Chloride: 105 mmol/L (ref 98–111)
Creatinine, Ser: 0.86 mg/dL (ref 0.61–1.24)
GFR, Estimated: >60 mL/min (ref >60)
Glucose, Bld: 109 mg/dL — ABNORMAL HIGH (ref 70–99)
Potassium: 3.8 mmol/L (ref 3.5–5.1)
Sodium: 139 mmol/L (ref 135–145)

## 2023-07-30 NOTE — Anesthesia Preprocedure Evaluation (Addendum)
Anesthesia Evaluation  Patient identified by MRN, date of birth, ID band Patient awake    Reviewed: Allergy & Precautions, NPO status , Patient's Chart, lab work & pertinent test results  History of Anesthesia Complications Negative for: history of anesthetic complications  Airway Mallampati: III  TM Distance: >3 FB Neck ROM: Full    Dental no notable dental hx. (+) Dental Advisory Given, Chipped   Pulmonary sleep apnea and Continuous Positive Airway Pressure Ventilation    Pulmonary exam normal breath sounds clear to auscultation       Cardiovascular hypertension (no longer on meds), (-) angina + CAD (mild, non-obsructive)  Normal cardiovascular exam Rhythm:Regular Rate:Normal  '22 cath: Mild nonobstructive CAD with a 20% smooth stenosis in the mid LAD; mild 30% focal stenosis in the mid circumflex vessel; and a normal dominant RCA.   Normal LV function with EF estimated 55 to 60%  '22 ECHO: EF 50 to 55%. 1. The LV has low normal function, no regional wall motion abnormalities. Grade I diastolic dysfunction (impaired relaxation).   2. RVF is normal. The right ventricular size is normal.   3. The mitral valve is normal in structure. Trivial MR. No evidence of mitral stenosis.   4. The aortic valve is tricuspid. Aortic valve regurgitation is trivial.     Neuro/Psych TIANo Residual Symptoms  negative psych ROS   GI/Hepatic Neg liver ROS,GERD  Medicated and Controlled,,RECURRENT DIVERTICULITIS   Endo/Other  Hypothyroidism    Renal/GU negative Renal ROS  negative genitourinary   Musculoskeletal  (+) Arthritis ,    Abdominal   Peds  Hematology negative hematology ROS (+)   Anesthesia Other Findings Day of surgery medications reviewed with patient. Prostate cancer  Reproductive/Obstetrics negative OB ROS                              Anesthesia Physical Anesthesia Plan  ASA:  2  Anesthesia Plan: General   Post-op Pain Management: Tylenol PO (pre-op)*, Ketamine IV* and Dilaudid IV   Induction: Intravenous  PONV Risk Score and Plan: 2 and Treatment may vary due to age or medical condition, Ondansetron, Dexamethasone and Midazolam  Airway Management Planned: Oral ETT  Additional Equipment: None  Intra-op Plan:   Post-operative Plan: Extubation in OR  Informed Consent: I have reviewed the patients History and Physical, chart, labs and discussed the procedure including the risks, benefits and alternatives for the proposed anesthesia with the patient or authorized representative who has indicated his/her understanding and acceptance.     Dental advisory given  Plan Discussed with: CRNA and Surgeon  Anesthesia Plan Comments: (See PAT note from 9/9 by K Gekas PA-C )        Anesthesia Quick Evaluation

## 2023-07-31 ENCOUNTER — Other Ambulatory Visit: Payer: Self-pay

## 2023-07-31 ENCOUNTER — Encounter (HOSPITAL_COMMUNITY): Payer: Self-pay | Admitting: Surgery

## 2023-07-31 ENCOUNTER — Ambulatory Visit (HOSPITAL_COMMUNITY)
Admission: RE | Admit: 2023-07-31 | Discharge: 2023-07-31 | Disposition: A | Discharge status: Home / Self Care | Payer: Medicare Other | Source: Ambulatory Visit | Attending: Surgery | Admitting: Surgery

## 2023-07-31 DIAGNOSIS — R1032 Left lower quadrant pain: Secondary | ICD-10-CM | POA: Diagnosis not present

## 2023-07-31 DIAGNOSIS — K5732 Diverticulitis of large intestine without perforation or abscess without bleeding: Secondary | ICD-10-CM | POA: Diagnosis not present

## 2023-07-31 MED ORDER — METRONIDAZOLE 500 MG PO TABS: 1000.0000 mg | ORAL_TABLET | ORAL | Status: DC

## 2023-07-31 MED ORDER — ENSURE PRE-SURGERY PO LIQD: 592.0000 mL | Freq: Once | ORAL | Status: DC

## 2023-07-31 MED ORDER — DEXAMETHASONE SODIUM PHOSPHATE 10 MG/ML IJ SOLN
INTRAMUSCULAR | Status: AC
  Filled 2023-07-31: qty 1

## 2023-07-31 MED ORDER — ENSURE PRE-SURGERY PO LIQD: 296.0000 mL | Freq: Once | ORAL | Status: DC

## 2023-07-31 MED ORDER — BUPIVACAINE-EPINEPHRINE 0.25% -1:200000 IJ SOLN
INTRAMUSCULAR | Status: AC
  Filled 2023-07-31: qty 1

## 2023-07-31 MED ORDER — FENTANYL CITRATE (PF) 250 MCG/5ML IJ SOLN
INTRAMUSCULAR | Status: AC
  Filled 2023-07-31: qty 5

## 2023-07-31 MED ORDER — HEPARIN SODIUM (PORCINE) 5000 UNIT/ML IJ SOLN
5000.0000 [IU] | Freq: Once | INTRAMUSCULAR | Status: AC
  Administered 2023-07-31: 5000 [IU] via SUBCUTANEOUS
  Filled 2023-07-31: qty 1

## 2023-07-31 MED ORDER — BUPIVACAINE LIPOSOME 1.3 % IJ SUSP
INTRAMUSCULAR | Status: AC
  Filled 2023-07-31: qty 20

## 2023-07-31 MED ORDER — PROPOFOL 10 MG/ML IV BOLUS
INTRAVENOUS | Status: AC
  Filled 2023-07-31: qty 20

## 2023-07-31 MED ORDER — BISACODYL 5 MG PO TBEC: 20.0000 mg | DELAYED_RELEASE_TABLET | Freq: Once | ORAL | Status: DC

## 2023-07-31 MED ORDER — LACTATED RINGERS IV SOLN: INTRAVENOUS | Status: DC

## 2023-07-31 MED ORDER — ALVIMOPAN 12 MG PO CAPS
12.0000 mg | ORAL_CAPSULE | ORAL | Status: AC
  Administered 2023-07-31: 12 mg via ORAL
  Filled 2023-07-31: qty 1

## 2023-07-31 MED ORDER — ONDANSETRON HCL 4 MG/2ML IJ SOLN
INTRAMUSCULAR | Status: AC
  Filled 2023-07-31: qty 2

## 2023-07-31 MED ORDER — SODIUM CHLORIDE 0.9 % IV SOLN
2.0000 g | INTRAVENOUS | Status: DC
  Filled 2023-07-31: qty 2

## 2023-07-31 MED ORDER — LIDOCAINE HCL (PF) 2 % IJ SOLN
INTRAMUSCULAR | Status: AC
  Filled 2023-07-31: qty 5

## 2023-07-31 MED ORDER — NEOMYCIN SULFATE 500 MG PO TABS: 1000.0000 mg | ORAL_TABLET | ORAL | Status: DC

## 2023-07-31 MED ORDER — CHLORHEXIDINE GLUCONATE CLOTH 2 % EX PADS: 6.0000 | MEDICATED_PAD | Freq: Once | CUTANEOUS | Status: DC

## 2023-07-31 MED ORDER — POLYETHYLENE GLYCOL 3350 17 GM/SCOOP PO POWD: 1.0000 | Freq: Once | ORAL | Status: DC

## 2023-07-31 MED ORDER — KETAMINE HCL 50 MG/5ML IJ SOSY
PREFILLED_SYRINGE | INTRAMUSCULAR | Status: AC
  Filled 2023-07-31: qty 5

## 2023-07-31 MED ORDER — SODIUM CHLORIDE (PF) 0.9 % IJ SOLN
INTRAMUSCULAR | Status: AC
  Filled 2023-07-31: qty 10

## 2023-07-31 MED ORDER — BUPIVACAINE LIPOSOME 1.3 % IJ SUSP: 20.0000 mL | Freq: Once | INTRAMUSCULAR | Status: DC

## 2023-07-31 MED ORDER — ROCURONIUM BROMIDE 10 MG/ML (PF) SYRINGE
PREFILLED_SYRINGE | INTRAVENOUS | Status: AC
  Filled 2023-07-31: qty 10

## 2023-07-31 MED ORDER — ACETAMINOPHEN 500 MG PO TABS
1000.0000 mg | ORAL_TABLET | ORAL | Status: AC
  Administered 2023-07-31: 1000 mg via ORAL
  Filled 2023-07-31: qty 2

## 2023-07-31 MED ORDER — MIDAZOLAM HCL 2 MG/2ML IJ SOLN
INTRAMUSCULAR | Status: AC
  Filled 2023-07-31: qty 2

## 2023-07-31 NOTE — Progress Notes (Signed)
Unfortunately due to loss of power at the hospital with no clear timing of resolution, his procedure scheduled for today at [facility] has been cancelled.   I have reached out to my office to work on re-booking his procedure. All of his questions were answered, he and his wife expressed understanding and agreement with the plan.  Marin Olp, MD [facility], [facility]

## 2023-07-31 NOTE — H&P (Signed)
CC: Here today for surgery  HPI: Zachary [name] is an 72 y.o. male was seen in the office today as a referral by Dr. Carmelia Roller for evaluation of history of diverticulitis.  CT A/P 05/23/23  Diverticulitis of proximal sigmoid colon is noted without abscess formation.  CT A/P 10/2021 1. No current diverticulitis or findings of colitis. 2. Hazy stranding in the central mesentery with scattered small nodes and slight displacement of bowel. The appearance favors sclerosing mesenteritis. 3. Other imaging findings of potential clinical significance: Aortic Atherosclerosis (ICD10-I70.0). Right coronary artery atherosclerosis. Prostatectomy. Lumbar spondylosis and degenerative disc disease.  CT A/P 07/2021 Acute sigmoid diverticulitis. No perforation or abscess.   Colonoscopy 12/2021 (Dr. Marina Goodell) - Multiple diverticula were found in the sigmoid colon. Very mild radiation proctitis. Small internal hemorrhoids.  - The entire examined colon appeared otherwise normal on direct and retroflexion views. Biopsies for histology were taken with a cold forceps from the entire colon for evaluation of microscopic colitis.  He is here today with his wife. Reports that over the last 10 or so years he has had at least 7-8 attacks of presumed diverticulitis. These have always been left lower quadrant pain that will last on the order of days. Typically improved with antibiotics.  He has had 4 attacks since 2018. His most recent attack was last month. He is still recovering from this from the left lower quadrant discomfort standpoint. Still has some intermittent discomfort. No nausea, vomiting, fever, chills. He denies any blood in his stool. He reports he has 3 soft bowel movements per day. He denies any stool softeners, laxatives, or fiber supplementation. Struggles to drink enough water.  He does follow with Dr. Arva Chafe as PCP  PMH: Hypothyroidism, GERD, HLD, allergies, TIA (2021)  PSH: Robotic  prostatectomy-Dr. Ardeen Garland had prostate radiation following surgery. Denies any other abdominal or pelvic surgical history.  FHx: Mother had breast cancer; otherwise, denies any known family history of colorectal, breast, endometrial or ovarian cancer  Social Hx: Denies use of tobacco/EtOH/illicit drug. He is now retired. 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 He is here today with his wife, who also worked for [employer] in the credentialing and medical records divisions.   He denies any changes in health or health history since we met in the office. No new medications/allergies. He states he is ready for surgery today.  Past Medical History:  Diagnosis Date   Arthritis    Articular cartilage disease    left shoulder   Diverticulitis    Dyslipidemia (high LDL; low HDL) 08/15/2016   GERD (gastroesophageal reflux disease)    Hearing problem    hearing deficit   Hemorrhoids    History of back surgery    Hypertension    off meds since 05/25/23 -   Hypothyroidism    Prostate cancer (HCC) 2015   treated and in remission   Sclerosing mesenteritis (HCC)    diagnosed 2022   Sleep apnea    uses a cpap   Stroke (HCC)    TIA -affected balance and went through PT.   Wears glasses    Wears hearing aid    both ears    Past Surgical History:  Procedure Laterality Date   BACK SURGERY  12/23/1976   herniated disksurgery   CARDIAC CATHETERIZATION  08/01/2010   COLONOSCOPY     2007, 2017 and 2023   EXAM UNDER ANESTHESIA WITH MANIPULATION OF SHOULDER Right 09/15/2014  Procedure: RIGHT SHOULDER MANIPULATION UNDER ANESTHESIA;  Surgeon: Loreta Ave, MD;  Location: [facility];  Service: Orthopedics;  Laterality: Right;   HEMORRHOID SURGERY  08/23/2006   LEFT HEART CATH AND CORONARY ANGIOGRAPHY N/A 01/16/2021   Procedure: LEFT HEART CATH AND CORONARY ANGIOGRAPHY;  Surgeon: Lennette Bihari, MD;  Location: [facility];   Service: Cardiovascular;  Laterality: N/A;   SHOULDER ARTHROSCOPY WITH DISTAL CLAVICLE RESECTION Left 07/07/2015   Procedure: SHOULDER ARTHROSCOPY WITH DISTAL CLAVICLE RESECTION;  Surgeon: Mckinley Jewel, MD;  Location: [facility];  Service: Orthopedics;  Laterality: Left;   SHOULDER ARTHROSCOPY WITH ROTATOR CUFF REPAIR Left 12/07/2015   Procedure: LEFT SHOULDER ARTHROSCOPY DEBRIDEMENT, WITH ROTATOR CUFF REPAIR;  Surgeon: Loreta Ave, MD;  Location: [facility];  Service: Orthopedics;  Laterality: Left;   SHOULDER ARTHROSCOPY WITH ROTATOR CUFF REPAIR AND SUBACROMIAL DECOMPRESSION Left 07/07/2015   Procedure: LEFT SHOULDER SCOPE DEBRIDEMENT, SUBACROMIAL DECOMPRESSION, DISTAL CLAVICULECTOMY, ROTATOR CUFF REPAIR  ;  Surgeon: Mckinley Jewel, MD;  Location: [facility];  Service: Orthopedics;  Laterality: Left;  ANESTHESIA: GENERAL, PRE/POST OP SCALENE   SHOULDER ARTHROSCOPY WITH SUBACROMIAL DECOMPRESSION, ROTATOR CUFF REPAIR AND BICEP TENDON REPAIR Right 03/10/2014   Procedure: RIGHT SHOULDER ARTHROSCOPY WITH SUBACROMIAL DECOMPRESSION, PARTIAL ACROMIOPLASTY WITH CORACOAROMIAL LABRUM DEBRIDEMENT RELEASE DISTAL CLAVICULECTOMY,  ROTATOR CUFF REPAIR AND EXTENSIVE DEBRIDEMENT;  Surgeon: Loreta Ave, MD;  Location: [facility];  Service: Orthopedics;  Laterality: Right;   TENDON REPAIR  06/06/2011   right elbow, Dr. Eulah Pont    Family History  Problem Relation Age of Onset   Colon polyps Mother    Breast cancer Mother    Heart disease Father 60   Cancer Brother    Alcohol abuse Other    Arthritis Other    Cancer Other        Breast, Prostate   Coronary artery disease Other    Irritable bowel syndrome Other    Cystic fibrosis Other    Colon cancer Neg Hx    Esophageal cancer Neg Hx    Stomach cancer Neg Hx    Rectal cancer Neg Hx     Social:  reports that he has never smoked. He has never used smokeless tobacco. He reports current  alcohol use. He reports that he does not use drugs.  Allergies:  Allergies  Allergen Reactions   Oxycodone Itching   Atorvastatin Other (See Comments)    Muscle pain   Mobic [Meloxicam] Other (See Comments)    Severe diarrhea    Simvastatin Other (See Comments)    Muscle pain    Medications: I have reviewed the patient's current medications.  No results found for this or any previous visit (from the past 48 hour(s)).  No results found.   PE Blood pressure (!) 145/93, pulse (!) 58, temperature 97.9 F (36.6 C), temperature source Oral, resp. rate 16, height 5\' 8"  (1.727 m), weight 90.7 kg, SpO2 94%. Constitutional: NAD; conversant Eyes: Moist conjunctiva; no lid lag; anicteric Lungs: Normal respiratory effort CV: RRR GI: Abd soft, NT/ND; no palpable hepatosplenomegaly Psychiatric: Appropriate affect  No results found for this or any previous visit (from the past 48 hour(s)).  No results found.  A/P: ALEX MENDIOLA is an 72 y.o. male with hx of hypothyroidism, GERD, HLD, allergies here for surgery regarding recurrent sigmoid diverticulitis - without perforation or abscess  -The anatomy and physiology of the GI tract was reviewed with the patient. The pathophysiology  of recurrent diverticulitis was discussed as well with associated pictures.  -With regards to recurrent diverticulitis, we spent time discussing general recommendations with regards to rationale for or against surgery. We discussed that surgery in this particular case is more of a quality of life decision based on severity of recurrences and frequency of attacks. We discussed that there is no magic number of attacks before surgery is clearly recommended but should base this on quality of life disturbances. We spent time giving him an overview of what surgery would offer including robotic assisted low anterior resection/sigmoidectomy, flexible sigmoidoscopy, ICG perfusion assessment.  -The planned procedure,  material risks (including, but not limited to, pain, bleeding, infection, scarring, need for blood transfusion, damage to surrounding structures- blood vessels/nerves/viscus/organs, damage to ureter, urine leak, leak from anastomosis, need for additional procedures, scenarios where a stoma may be necessary and where it may be permanent, worsening of pre-existing medical conditions, chronic diarrhea, constipation secondary to narcotic use, hernia, recurrence, pneumonia, heart attack, stroke, death) benefits and alternatives to surgery were discussed at length. The patient's questions were answered to his satisfaction, he voiced understanding and has elected to proceed with surgery. Additionally, we discussed typical postoperative expectations and the recovery process.   Marin Olp, MD [facility], [facility]

## 2023-08-02 ENCOUNTER — Encounter: Payer: Self-pay | Admitting: Family Medicine

## 2023-08-04 ENCOUNTER — Other Ambulatory Visit: Payer: Self-pay | Admitting: Family Medicine

## 2023-08-07 ENCOUNTER — Encounter (INDEPENDENT_AMBULATORY_CARE_PROVIDER_SITE_OTHER): Payer: Self-pay

## 2023-08-22 NOTE — Progress Notes (Signed)
Sent message, via epic in basket, requesting orders in epic from surgeon.  

## 2023-08-27 ENCOUNTER — Ambulatory Visit: Payer: Self-pay | Admitting: Surgery

## 2023-08-29 NOTE — Progress Notes (Signed)
COVID Vaccine Completed: yes  Date of COVID positive in last 90 days:  PCP - Arva Chafe, DO Cardiologist - Armanda Magic, MD LOV 04/30/21  Chest x-ray - 05/25/23 Epic EKG - 05/23/23 Epic brady at 52 Stress Test - n/a ECHO - 01/16/21 Epic Cardiac Cath - 01/16/21 Epic Pacemaker/ICD device last checked: n/a Spinal Cord Stimulator: n/a  Bowel Prep - yes, has instructions  Sleep Study - yes CPAP - yes every night   Fasting Blood Sugar -preDM, no checks at home Checks Blood Sugar _____ times a day  Last dose of GLP1 agonist-  N/A GLP1 instructions:  N/A   Last dose of SGLT-2 inhibitors-  N/A SGLT-2 instructions: N/A   Blood Thinner Instructions:  Time Aspirin Instructions: ASA 81, continue taking per surgeon Last Dose:  Activity level: Can go up a flight of stairs and perform activities of daily living without stopping and without symptoms of chest pain or shortness of breath.   Anesthesia review: HTN, CAD, OSA  Patient denies shortness of breath, fever, cough and chest pain at PAT appointment  Patient verbalized understanding of instructions that were given to them at the PAT appointment. Patient was also instructed that they will need to review over the PAT instructions again at home before surgery.

## 2023-08-29 NOTE — Patient Instructions (Signed)
SURGICAL WAITING ROOM VISITATION  Patients having surgery or a procedure may have no more than 2 support people in the waiting area - these visitors may rotate.    Children under the age of 61 must have an adult with them who is not the patient.  Due to an increase in RSV and influenza rates and associated hospitalizations, children ages 86 and under may not visit patients in [facility] hospitals.  If the patient needs to stay at the hospital during part of their recovery, the visitor guidelines for inpatient rooms apply. Pre-op nurse will coordinate an appropriate time for 1 support person to accompany patient in pre-op.  This support person may not rotate.    Please refer to the [facility] website for the visitor guidelines for Inpatients (after your surgery is over and you are in a regular room).    Your procedure is scheduled on: 09/11/23   Report to [facility] Main Entrance    Report to admitting at 8:45 AM   Call this number if you have problems the morning of surgery [phone]   Do not eat food :After Midnight.   After Midnight you may have the following liquids until 8:00 AM DAY OF SURGERY  Water Non-Citrus Juices (without pulp, NO RED-Apple, White grape, White cranberry) Black Coffee (NO MILK/CREAM OR CREAMERS, sugar ok)  Clear Tea (NO MILK/CREAM OR CREAMERS, sugar ok) regular and decaf                             Plain Jell-O (NO RED)                                           Fruit ices (not with fruit pulp, NO RED)                                     Popsicles (NO RED)                                                               Sports drinks like Gatorade (NO RED)              Drink 2 Ensure drinks AT 10:00 PM the night before surgery.        The day of surgery:  Drink ONE (1) Pre-Surgery Clear Ensure at 8:00 AM the morning of surgery. Drink in one sitting. Do not sip.  This drink was given to you during your hospital  pre-op appointment  visit. Nothing else to drink after completing the  Pre-Surgery Clear Ensure.          If you have questions, please contact your surgeon's office.   FOLLOW BOWEL PREP AND ANY ADDITIONAL PRE OP INSTRUCTIONS YOU RECEIVED FROM YOUR SURGEON'S OFFICE!!!     Oral Hygiene is also important to reduce your risk of infection.                                    Remember -  BRUSH YOUR TEETH THE MORNING OF SURGERY WITH YOUR REGULAR TOOTHPASTE  DENTURES WILL BE REMOVED PRIOR TO SURGERY PLEASE DO NOT APPLY "Poly grip" OR ADHESIVES!!!   Stop all vitamins and herbal supplements 7 days before surgery.   Take these medicines the morning of surgery with A SIP OF WATER: Tylenol, Flonase, Claritin, Omeprazole   Bring CPAP mask and tubing day of surgery.                              You may not have any metal on your body including jewelry, and body piercing             Do not wear lotions, powders, cologne, or deodorant              Men may shave face and neck.   Do not bring valuables to the hospital. [place] IS NOT             RESPONSIBLE   FOR VALUABLES.   Contacts, glasses, dentures or bridgework may not be worn into surgery.   Bring small overnight bag day of surgery.   DO NOT BRING YOUR HOME MEDICATIONS TO THE HOSPITAL. PHARMACY WILL DISPENSE MEDICATIONS LISTED ON YOUR MEDICATION LIST TO YOU DURING YOUR ADMISSION IN THE HOSPITAL!              Please read over the following fact sheets you were given: IF YOU HAVE QUESTIONS ABOUT YOUR PRE-OP INSTRUCTIONS PLEASE CALL [phone] [name]    If you received a COVID test during your pre-op visit  it is requested that you wear a mask when out in public, stay away from anyone that may not be feeling well and notify your surgeon if you develop symptoms. If you test positive for Covid or have been in contact with anyone that has tested positive in the last 10 days please notify you surgeon.    [facility] - Preparing for Surgery Before surgery,  you can play an important role.  Because skin is not sterile, your skin needs to be as free of germs as possible.  You can reduce the number of germs on your skin by washing with CHG (chlorahexidine gluconate) soap before surgery.  CHG is an antiseptic cleaner which kills germs and bonds with the skin to continue killing germs even after washing. Please DO NOT use if you have an allergy to CHG or antibacterial soaps.  If your skin becomes reddened/irritated stop using the CHG and inform your nurse when you arrive at Short Stay. Do not shave (including legs and underarms) for at least 48 hours prior to the first CHG shower.  You may shave your face/neck.  Please follow these instructions carefully:  1.  Shower with CHG Soap the night before surgery and the  morning of surgery.  2.  If you choose to wash your hair, wash your hair first as usual with your normal  shampoo.  3.  After you shampoo, rinse your hair and body thoroughly to remove the shampoo.                             4.  Use CHG as you would any other liquid soap.  You can apply chg directly to the skin and wash.  Gently with a scrungie or clean washcloth.  5.  Apply the CHG Soap to your body ONLY FROM THE NECK  DOWN.   Do   not use on face/ open                           Wound or open sores. Avoid contact with eyes, ears mouth and   genitals (private parts).                       Wash face,  Genitals (private parts) with your normal soap.             6.  Wash thoroughly, paying special attention to the area where your    surgery  will be performed.  7.  Thoroughly rinse your body with warm water from the neck down.  8.  DO NOT shower/wash with your normal soap after using and rinsing off the CHG Soap.                9.  Pat yourself dry with a clean towel.            10.  Wear clean pajamas.            11.  Place clean sheets on your bed the night of your first shower and do not  sleep with pets. Day of Surgery : Do not apply any  lotions/deodorants the morning of surgery.  Please wear clean clothes to the hospital/surgery center.  FAILURE TO FOLLOW THESE INSTRUCTIONS MAY RESULT IN THE CANCELLATION OF YOUR SURGERY  PATIENT SIGNATURE_________________________________  NURSE SIGNATURE__________________________________  ________________________________________________________________________  Zachary [name]  An incentive spirometer is a tool that can help keep your lungs clear and active. This tool measures how well you are filling your lungs with each breath. Taking long deep breaths may help reverse or decrease the chance of developing breathing (pulmonary) problems (especially infection) following: A long period of time when you are unable to move or be active. BEFORE THE PROCEDURE  If the spirometer includes an indicator to show your best effort, your nurse or respiratory therapist will set it to a desired goal. If possible, sit up straight or lean slightly forward. Try not to slouch. Hold the incentive spirometer in an upright position. INSTRUCTIONS FOR USE  Sit on the edge of your bed if possible, or sit up as far as you can in bed or on a chair. Hold the incentive spirometer in an upright position. Breathe out normally. Place the mouthpiece in your mouth and seal your lips tightly around it. Breathe in slowly and as deeply as possible, raising the piston or the ball toward the top of the column. Hold your breath for 3-5 seconds or for as long as possible. Allow the piston or ball to fall to the bottom of the column. Remove the mouthpiece from your mouth and breathe out normally. Rest for a few seconds and repeat Steps 1 through 7 at least 10 times every 1-2 hours when you are awake. Take your time and take a few normal breaths between deep breaths. The spirometer may include an indicator to show your best effort. Use the indicator as a goal to work toward during each repetition. After each set of 10 deep  breaths, practice coughing to be sure your lungs are clear. If you have an incision (the cut made at the time of surgery), support your incision when coughing by placing a pillow or rolled up towels firmly against it. Once you are able to get out of bed, walk around indoors  and cough well. You may stop using the incentive spirometer when instructed by your caregiver.  RISKS AND COMPLICATIONS Take your time so you do not get dizzy or light-headed. If you are in pain, you may need to take or ask for pain medication before doing incentive spirometry. It is harder to take a deep breath if you are having pain. AFTER USE Rest and breathe slowly and easily. It can be helpful to keep track of a log of your progress. Your caregiver can provide you with a simple table to help with this. If you are using the spirometer at home, follow these instructions: SEEK MEDICAL CARE IF:  You are having difficultly using the spirometer. You have trouble using the spirometer as often as instructed. Your pain medication is not giving enough relief while using the spirometer. You develop fever of 100.5 F (38.1 C) or higher. SEEK IMMEDIATE MEDICAL CARE IF:  You cough up bloody sputum that had not been present before. You develop fever of 102 F (38.9 C) or greater. You develop worsening pain at or near the incision site. MAKE SURE YOU:  Understand these instructions. Will watch your condition. Will get help right away if you are not doing well or get worse. Document Released: 04/21/2007 Document Revised: 03/02/2012 Document Reviewed: 06/22/2007 [employer] Patient Information 2014 [employer], [place].   ________________________________________________________________________ WHAT IS A BLOOD TRANSFUSION? Blood Transfusion Information  A transfusion is the replacement of blood or some of its parts. Blood is made up of multiple cells which provide different functions. Red blood cells carry oxygen and are used for blood  loss replacement. White blood cells fight against infection. Platelets control bleeding. Plasma helps clot blood. Other blood products are available for specialized needs, such as hemophilia or other clotting disorders. BEFORE THE TRANSFUSION  Who gives blood for transfusions?  Healthy volunteers who are fully evaluated to make sure their blood is safe. This is blood bank blood. Transfusion therapy is the safest it has ever been in the practice of medicine. Before blood is taken from a donor, a complete history is taken to make sure that person has no history of diseases nor engages in risky social behavior (examples are intravenous drug use or sexual activity with multiple partners). The donor's travel history is screened to minimize risk of transmitting infections, such as malaria. The donated blood is tested for signs of infectious diseases, such as HIV and hepatitis. The blood is then tested to be sure it is compatible with you in order to minimize the chance of a transfusion reaction. If you or a relative donates blood, this is often done in anticipation of surgery and is not appropriate for emergency situations. It takes many days to process the donated blood. RISKS AND COMPLICATIONS Although transfusion therapy is very safe and saves many lives, the main dangers of transfusion include:  Getting an infectious disease. Developing a transfusion reaction. This is an allergic reaction to something in the blood you were given. Every precaution is taken to prevent this. The decision to have a blood transfusion has been considered carefully by your caregiver before blood is given. Blood is not given unless the benefits outweigh the risks. AFTER THE TRANSFUSION Right after receiving a blood transfusion, you will usually feel much better and more energetic. This is especially true if your red blood cells have gotten low (anemic). The transfusion raises the level of the red blood cells which carry oxygen,  and this usually causes an energy increase. The nurse administering  the transfusion will monitor you carefully for complications. HOME CARE INSTRUCTIONS  No special instructions are needed after a transfusion. You may find your energy is better. Speak with your caregiver about any limitations on activity for underlying diseases you may have. SEEK MEDICAL CARE IF:  Your condition is not improving after your transfusion. You develop redness or irritation at the intravenous (IV) site. SEEK IMMEDIATE MEDICAL CARE IF:  Any of the following symptoms occur over the next 12 hours: Shaking chills. You have a temperature by mouth above 102 F (38.9 C), not controlled by medicine. Chest, back, or muscle pain. People around you feel you are not acting correctly or are confused. Shortness of breath or difficulty breathing. Dizziness and fainting. You get a rash or develop hives. You have a decrease in urine output. Your urine turns a dark color or changes to pink, red, or brown. Any of the following symptoms occur over the next 10 days: You have a temperature by mouth above 102 F (38.9 C), not controlled by medicine. Shortness of breath. Weakness after normal activity. The white part of the eye turns yellow (jaundice). You have a decrease in the amount of urine or are urinating less often. Your urine turns a dark color or changes to pink, red, or brown. Document Released: 12/06/2000 Document Revised: 03/02/2012 Document Reviewed: 07/25/2008 [facility] Patient Information 2014 [place], [place].  _______________________________________________________________________

## 2023-08-30 ENCOUNTER — Other Ambulatory Visit: Payer: Self-pay | Admitting: Family Medicine

## 2023-09-01 ENCOUNTER — Other Ambulatory Visit: Payer: Self-pay

## 2023-09-01 ENCOUNTER — Encounter (HOSPITAL_COMMUNITY): Payer: Self-pay

## 2023-09-01 ENCOUNTER — Encounter (HOSPITAL_COMMUNITY)
Admission: RE | Admit: 2023-09-01 | Discharge: 2023-09-01 | Disposition: A | Discharge status: Home / Self Care | Payer: Medicare Other | Source: Ambulatory Visit | Attending: Surgery | Admitting: Surgery

## 2023-09-01 VITALS — BP 149/88 | HR 55 | Temp 97.9°F | Resp 16 | Ht 68.0 in | Wt 205.7 lb

## 2023-09-01 DIAGNOSIS — I25729 Atherosclerosis of autologous artery coronary artery bypass graft(s) with unspecified angina pectoris: Secondary | ICD-10-CM | POA: Insufficient documentation

## 2023-09-01 DIAGNOSIS — Z01812 Encounter for preprocedural laboratory examination: Secondary | ICD-10-CM | POA: Insufficient documentation

## 2023-09-01 DIAGNOSIS — Z01818 Encounter for other preprocedural examination: Secondary | ICD-10-CM

## 2023-09-01 HISTORY — DX: Atherosclerotic heart disease of native coronary artery without angina pectoris: I25.10

## 2023-09-01 LAB — CBC
HCT: 44.1 % (ref 39.0–52.0)
Hemoglobin: 14.9 g/dL (ref 13.0–17.0)
MCH: 31.6 pg (ref 26.0–34.0)
MCHC: 33.8 g/dL (ref 30.0–36.0)
MCV: 93.6 fL (ref 80.0–100.0)
Platelets: 239 10*3/uL (ref 150–400)
RBC: 4.71 MIL/uL (ref 4.22–5.81)
RDW: 12.6 % (ref 11.5–15.5)
WBC: 5.6 10*3/uL (ref 4.0–10.5)
nRBC: 0 % (ref 0.0–0.2)

## 2023-09-01 LAB — BASIC METABOLIC PANEL
Anion gap: 9 (ref 5–15)
BUN: 15 mg/dL (ref 8–23)
CO2: 25 mmol/L (ref 22–32)
Calcium: 9.3 mg/dL (ref 8.9–10.3)
Chloride: 103 mmol/L (ref 98–111)
Creatinine, Ser: 0.89 mg/dL (ref 0.61–1.24)
GFR, Estimated: >60 mL/min (ref >60)
Glucose, Bld: 96 mg/dL (ref 70–99)
Potassium: 3.6 mmol/L (ref 3.5–5.1)
Sodium: 137 mmol/L (ref 135–145)

## 2023-09-03 ENCOUNTER — Encounter (HOSPITAL_COMMUNITY): Payer: Self-pay

## 2023-09-03 NOTE — Progress Notes (Signed)
Choose an anesthesia record to view details        DISCUSSION: Zachary [name] is a 72 yo male who presents for evaluation prior to robotic assisted LAR and sigmoidectomy with Dr. Cliffton Asters on 09/11/23. Surgery was originally scheduled for 8/8 but due to power loss it was rescheduled. PMH significant for HTN (off meds), CAD (mild, non-obstructive), OSA (uses CPAP), GERD, hypothyroidism, hx of TIA, arthritis, hx of prostate cancer, prior back surgery (in 1978), recurrent diverticulitis.  Patient was evaluated by Cardiology remotely in 2011 after he developed chest pain. He underwent LHC which minimal disease. It was felt this chest pain may be reflux in etiology. He was admitted in Jan 2022 for chest pain again and had another LHC and again showed mild CAD. Medical management recommended. He is on ASA and a statin. Lisinopril was d/c due to cough during hospitalization for diverticulitis in June and he was not started on any new BP meds due to being normotensive.  He follows with his PCP for chronic medical issues. Last seen on 06/02/23. All issues appear to be stable.  VS: BP (!) 149/88   Pulse (!) 55   Temp 36.6 C (Oral)   Resp 16   Ht 5\' 8"  (1.727 m)   Wt 93.3 kg   SpO2 95%   BMI 31.28 kg/m   PROVIDERS: Sharlene Dory, DO   LABS: Labs reviewed: Acceptable for surgery. (all labs ordered are listed, but only abnormal results are displayed)  Labs Reviewed  BASIC METABOLIC PANEL  CBC  TYPE AND SCREEN     IMAGES:  CXR 05/25/23:  FINDINGS: Transverse diameter of heart is increased. There are no signs of pulmonary edema or focal pulmonary consolidation. Thin linear density in medial left lower lung fields suggest minimal scarring or subsegmental atelectasis. Left lateral CP angle is indistinct. There is no pneumothorax. Degenerative changes are noted in the left shoulder.   IMPRESSION: There are no focal infiltrates or signs of pulmonary edema.  CT abdomen/pelvis  05/23/23:  IMPRESSION: Diverticulitis of proximal sigmoid colon is noted without abscess formation.   Aortic Atherosclerosis (ICD10-I70.0).   EKG:   CV:  Echo 01/16/2021:  IMPRESSIONS     1. Left ventricular ejection fraction, by estimation, is 50 to 55%. The  left ventricle has low normal function. The left ventricle has no regional  wall motion abnormalities. Left ventricular diastolic parameters are  consistent with Grade I diastolic  dysfunction (impaired relaxation).   2. Right ventricular systolic function is normal. The right ventricular  size is normal.   3. The mitral valve is normal in structure. Trivial mitral valve  regurgitation. No evidence of mitral stenosis.   4. The aortic valve is tricuspid. Aortic valve regurgitation is trivial.  Mild aortic valve sclerosis is present, with no evidence of aortic valve  stenosis.   Left heart cath 01/16/2021:  Prox Cx to Mid Cx lesion is 30% stenosed. Mid LAD lesion is 20% stenosed.   Mild nonobstructive CAD with a 20% smooth stenosis in the mid LAD; mild 30% focal stenosis in the mid circumflex vessel; and a normal dominant RCA.     Normal LV function with EF estimated 55 to 60% without focal segmental wall motion abnormalities.  LVEDP 13 mmHg.   RECOMMENDATION: Medical therapy.  Aggressive lipid-lowering therapy with target LDL less than 70%.  Past Medical History:  Diagnosis Date   Arthritis    Articular cartilage disease    left shoulder   Diverticulitis  Dyslipidemia (high LDL; low HDL) 08/15/2016   GERD (gastroesophageal reflux disease)    Hearing problem    hearing deficit   Hemorrhoids    History of back surgery    Hypertension    off meds since 05/25/23 -   Hypothyroidism    Prostate cancer (HCC) 2015   treated and in remission   Sclerosing mesenteritis (HCC)    diagnosed 2022   Sleep apnea    uses a cpap   Stroke (HCC)    TIA -affected balance and went through PT.   Wears glasses    Wears  hearing aid    both ears    Past Surgical History:  Procedure Laterality Date   BACK SURGERY  12/23/1976   herniated disksurgery   CARDIAC CATHETERIZATION  08/01/2010   COLONOSCOPY     2007, 2017 and 2023   EXAM UNDER ANESTHESIA WITH MANIPULATION OF SHOULDER Right 09/15/2014   Procedure: RIGHT SHOULDER MANIPULATION UNDER ANESTHESIA;  Surgeon: Loreta Ave, MD;  Location: [facility];  Service: Orthopedics;  Laterality: Right;   HEMORRHOID SURGERY  08/23/2006   LEFT HEART CATH AND CORONARY ANGIOGRAPHY N/A 01/16/2021   Procedure: LEFT HEART CATH AND CORONARY ANGIOGRAPHY;  Surgeon: Lennette Bihari, MD;  Location: [facility];  Service: Cardiovascular;  Laterality: N/A;   SHOULDER ARTHROSCOPY WITH DISTAL CLAVICLE RESECTION Left 07/07/2015   Procedure: SHOULDER ARTHROSCOPY WITH DISTAL CLAVICLE RESECTION;  Surgeon: Mckinley Jewel, MD;  Location: [facility];  Service: Orthopedics;  Laterality: Left;   SHOULDER ARTHROSCOPY WITH ROTATOR CUFF REPAIR Left 12/07/2015   Procedure: LEFT SHOULDER ARTHROSCOPY DEBRIDEMENT, WITH ROTATOR CUFF REPAIR;  Surgeon: Loreta Ave, MD;  Location: [facility];  Service: Orthopedics;  Laterality: Left;   SHOULDER ARTHROSCOPY WITH ROTATOR CUFF REPAIR AND SUBACROMIAL DECOMPRESSION Left 07/07/2015   Procedure: LEFT SHOULDER SCOPE DEBRIDEMENT, SUBACROMIAL DECOMPRESSION, DISTAL CLAVICULECTOMY, ROTATOR CUFF REPAIR  ;  Surgeon: Mckinley Jewel, MD;  Location: [facility];  Service: Orthopedics;  Laterality: Left;  ANESTHESIA: GENERAL, PRE/POST OP SCALENE   SHOULDER ARTHROSCOPY WITH SUBACROMIAL DECOMPRESSION, ROTATOR CUFF REPAIR AND BICEP TENDON REPAIR Right 03/10/2014   Procedure: RIGHT SHOULDER ARTHROSCOPY WITH SUBACROMIAL DECOMPRESSION, PARTIAL ACROMIOPLASTY WITH CORACOAROMIAL LABRUM DEBRIDEMENT RELEASE DISTAL CLAVICULECTOMY,  ROTATOR CUFF REPAIR AND EXTENSIVE DEBRIDEMENT;  Surgeon: Loreta Ave, MD;   Location: [facility];  Service: Orthopedics;  Laterality: Right;   TENDON REPAIR  06/06/2011   right elbow, Dr. Eulah Pont    MEDICATIONS:  acetaminophen (TYLENOL) 500 MG tablet   aspirin EC 81 MG EC tablet   dicyclomine (BENTYL) 10 MG capsule   fluticasone (FLONASE) 50 MCG/ACT nasal spray   Hydrocortisone (PREPARATION H EX)   levothyroxine (SYNTHROID) 100 MCG tablet   loratadine (CLARITIN) 10 MG tablet   melatonin 5 MG TABS   omeprazole (PRILOSEC) 20 MG capsule   pravastatin (PRAVACHOL) 80 MG tablet   saccharomyces boulardii (FLORASTOR) 250 MG capsule   sodium bicarbonate 650 MG tablet   No current facility-administered medications for this encounter.   Marcille Blanco [facility] Surgical Short Stay/Anesthesiology [facility] Phone [phone] 09/03/2023 11:00 AM

## 2023-09-04 ENCOUNTER — Ambulatory Visit (INDEPENDENT_AMBULATORY_CARE_PROVIDER_SITE_OTHER): Payer: Medicare Other | Admitting: *Deleted

## 2023-09-04 DIAGNOSIS — Z0000 Encounter for general adult medical examination without abnormal findings: Secondary | ICD-10-CM

## 2023-09-04 NOTE — Progress Notes (Signed)
Subjective:   Zachary [name] is a 72 y.o. male who presents for Medicare Annual/Subsequent preventive examination.  Visit Complete: Virtual  I connected with  Zachary [name] on 09/04/23 by a audio enabled telemedicine application and verified that I am speaking with the correct person using two identifiers.  Patient Location: Home  Provider Location: Office/Clinic  I discussed the limitations of evaluation and management by telemedicine. The patient expressed understanding and agreed to proceed.  Patient Medicare AWV questionnaire was completed by the patient on 08/28/23; I have confirmed that all information answered by patient is correct and no changes since this date.  Review of Systems     Cardiac Risk Factors include: advanced age (>39men, >25 women);male gender;dyslipidemia;hypertension     Objective:    Vital Signs: Unable to obtain new vitals due to this being a telehealth visit.      09/04/2023    8:18 AM 09/01/2023    8:12 AM 07/31/2023    9:08 AM 07/21/2023    8:52 AM 05/23/2023    7:27 AM 09/02/2022    8:21 AM 08/29/2021    7:44 AM  Advanced Directives  Does Patient Have a Medical Advance Directive? Yes Yes Yes Yes No Yes Yes  Type of Advance Directive Living will Living will Healthcare Power of [place];Living will Healthcare Power of [place];Living will  Healthcare Power of [place];Living will Healthcare Power of [place];Living will  Does patient want to make changes to medical advance directive? No - Patient declined  No - Patient declined   No - Patient declined   Copy of Healthcare Power of Attorney in Chart?   No - copy requested   Yes - validated most recent copy scanned in chart (See row information) No - copy requested  Would patient like information on creating a medical advance directive?     No - Patient declined      Current Medications (verified) Outpatient Encounter Medications as of 09/04/2023  Medication Sig   aspirin EC 81 MG EC tablet Take 81  mg by mouth daily.   dicyclomine (BENTYL) 10 MG capsule TAKE 1 CAPSULE BY MOUTH EVERY 6 HOURS AS NEEDED FOR  ABDOMINAL  CRAMPING   fluticasone (FLONASE) 50 MCG/ACT nasal spray Place 2 sprays into both nostrils daily.   levothyroxine (SYNTHROID) 100 MCG tablet Take 1 tablet by mouth once daily (Patient taking differently: Take 100 mcg by mouth every evening.)   loratadine (CLARITIN) 10 MG tablet Take 10 mg by mouth daily.   melatonin 5 MG TABS Take 5 mg by mouth at bedtime as needed (sleep).   omeprazole (PRILOSEC) 20 MG capsule Take 1 capsule (20 mg total) by mouth daily.   pravastatin (PRAVACHOL) 80 MG tablet Take 1 tablet by mouth once daily (Patient taking differently: Take 80 mg by mouth daily with supper.)   sodium bicarbonate 650 MG tablet Take 1 tablet by mouth once daily   No facility-administered encounter medications on file as of 09/04/2023.    Allergies (verified) Oxycodone, Atorvastatin, Mobic [meloxicam], and Simvastatin   History: Past Medical History:  Diagnosis Date   Allergy Seasonal/dust   take Loratadine 10 mg daily   Arthritis    Articular cartilage disease    left shoulder   CAD (coronary artery disease)    Diverticulitis    Dyslipidemia (high LDL; low HDL) 08/15/2016   GERD (gastroesophageal reflux disease)    Hearing problem    hearing deficit   Hemorrhoids    History of back surgery  Hypertension    off meds since 05/25/23 -   Hypothyroidism    Prostate cancer (HCC) 2015   treated and in remission   Sclerosing mesenteritis (HCC)    diagnosed 2022   Sleep apnea    uses a cpap   Stroke (HCC)    TIA -affected balance and went through PT.   Wears glasses    Wears hearing aid    both ears   Past Surgical History:  Procedure Laterality Date   BACK SURGERY  12/23/1976   herniated disksurgery   CARDIAC CATHETERIZATION  08/01/2010   COLONOSCOPY     2007, 2017 and 2023   EXAM UNDER ANESTHESIA WITH MANIPULATION OF SHOULDER Right 09/15/2014    Procedure: RIGHT SHOULDER MANIPULATION UNDER ANESTHESIA;  Surgeon: Loreta Ave, MD;  Location: [facility];  Service: Orthopedics;  Laterality: Right;   HEMORRHOID SURGERY  08/23/2006   LEFT HEART CATH AND CORONARY ANGIOGRAPHY N/A 01/16/2021   Procedure: LEFT HEART CATH AND CORONARY ANGIOGRAPHY;  Surgeon: Lennette Bihari, MD;  Location: [facility];  Service: Cardiovascular;  Laterality: N/A;   SHOULDER ARTHROSCOPY WITH DISTAL CLAVICLE RESECTION Left 07/07/2015   Procedure: SHOULDER ARTHROSCOPY WITH DISTAL CLAVICLE RESECTION;  Surgeon: Mckinley Jewel, MD;  Location: [facility];  Service: Orthopedics;  Laterality: Left;   SHOULDER ARTHROSCOPY WITH ROTATOR CUFF REPAIR Left 12/07/2015   Procedure: LEFT SHOULDER ARTHROSCOPY DEBRIDEMENT, WITH ROTATOR CUFF REPAIR;  Surgeon: Loreta Ave, MD;  Location: [facility];  Service: Orthopedics;  Laterality: Left;   SHOULDER ARTHROSCOPY WITH ROTATOR CUFF REPAIR AND SUBACROMIAL DECOMPRESSION Left 07/07/2015   Procedure: LEFT SHOULDER SCOPE DEBRIDEMENT, SUBACROMIAL DECOMPRESSION, DISTAL CLAVICULECTOMY, ROTATOR CUFF REPAIR  ;  Surgeon: Mckinley Jewel, MD;  Location: [facility];  Service: Orthopedics;  Laterality: Left;  ANESTHESIA: GENERAL, PRE/POST OP SCALENE   SHOULDER ARTHROSCOPY WITH SUBACROMIAL DECOMPRESSION, ROTATOR CUFF REPAIR AND BICEP TENDON REPAIR Right 03/10/2014   Procedure: RIGHT SHOULDER ARTHROSCOPY WITH SUBACROMIAL DECOMPRESSION, PARTIAL ACROMIOPLASTY WITH CORACOAROMIAL LABRUM DEBRIDEMENT RELEASE DISTAL CLAVICULECTOMY,  ROTATOR CUFF REPAIR AND EXTENSIVE DEBRIDEMENT;  Surgeon: Loreta Ave, MD;  Location: [facility];  Service: Orthopedics;  Laterality: Right;   SPINE SURGERY  Disc repaired 12/1976   TENDON REPAIR  06/06/2011   right elbow, Dr. Eulah Pont   Family History  Problem Relation Age of Onset   Colon polyps Mother    Breast cancer Mother    Arthritis Mother     Cancer Mother    Hearing loss Mother    Miscarriages / [place] Mother    Varicose Veins Mother    Heart disease Father 47   Alcohol abuse Father    Asthma Father    Hypertension Father    Cancer Brother    Alcohol abuse Other    Arthritis Other    Cancer Other        Breast, Prostate   Coronary artery disease Other    Irritable bowel syndrome Other    Cystic fibrosis Other    Anxiety disorder Son    Colon cancer Neg Hx    Esophageal cancer Neg Hx    Stomach cancer Neg Hx    Rectal cancer Neg Hx    Social History   Socioeconomic History   Marital status: Married    Spouse name: [name]   Number of children: 2   Years of education: Not on file   Highest education level: Not on file  Occupational History  Occupation: retired Engineer, manufacturing: [employer]    Comment: Geologist, engineering  Tobacco Use   Smoking status: Never   Smokeless tobacco: Never  Vaping Use   Vaping status: Never Used  Substance and Sexual Activity   Alcohol use: Yes    Alcohol/week: 1.0 standard drink of alcohol    Types: 1 Glasses of wine per week    Comment: Glass of wine occasionally   Drug use: No   Sexual activity: Not Currently  Other Topics Concern   Not on file  Social History Narrative   Previously Loss adjuster, [employer]   Social Determinants of Health   Financial Resource Strain: Low Risk  (08/28/2023)   Overall Financial Resource Strain (CARDIA)    Difficulty of Paying Living Expenses: Not hard at all  Food Insecurity: No Food Insecurity (08/28/2023)   Hunger Vital Sign    Worried About Running Out of Food in the Last Year: Never true    Ran Out of Food in the Last Year: Never true  Transportation Needs: No Transportation Needs (08/28/2023)   PRAPARE - Administrator, Civil Service (Medical): No    Lack of Transportation (Non-Medical): No  Physical Activity: Sufficiently Active (08/28/2023)   Exercise Vital Sign     Days of Exercise per Week: 7 days    Minutes of Exercise per Session: 40 min  Stress: No Stress Concern Present (08/28/2023)   [employer] of Occupational Health - Occupational Stress Questionnaire    Feeling of Stress : Only a little  Social Connections: Unknown (08/28/2023)   Social Connection and Isolation Panel [NHANES]    Frequency of Communication with Friends and Family: More than three times a week    Frequency of Social Gatherings with Friends and Family: More than three times a week    Attends Religious Services: Patient declined    Database administrator or Organizations: No    Attends Engineer, structural: Never    Marital Status: Married    Tobacco Counseling Counseling given: Not Answered   Clinical Intake:  Pre-visit preparation completed: Yes  Pain : 0-10 Pain Score: 4  Pain Location: Abdomen Pain Onset: More than a month ago Pain Frequency: Intermittent  Nutritional Risks: None Diabetes: No  How often do you need to have someone help you when you read instructions, pamphlets, or other written materials from your doctor or pharmacy?: 1 - Never  Interpreter Needed?: No  Information entered by :: [employer], CMA   Activities of Daily Living    09/04/2023    8:41 AM 09/01/2023    8:13 AM  In your present state of health, do you have any difficulty performing the following activities:  Hearing? 1   Comment wears hearing aids   Vision? 0   Difficulty concentrating or making decisions? 0   Walking or climbing stairs? 0   Dressing or bathing? 0   Doing errands, shopping? 0 0  Preparing Food and eating ? N   Using the Toilet? N   In the past six months, have you accidently leaked urine? Y   Do you have problems with loss of bowel control? N   Managing your Medications? N   Managing your Finances? N   Housekeeping or managing your Housekeeping? N     Patient Care Team: Sharlene Dory, DO as PCP - General (Family  Medicine) Quintella Reichert, MD as PCP - Cardiology (Cardiology) [place],  Acie Fredrickson, MD (Inactive) (Internal Medicine) Marcine Matar, MD as Consulting Physician (Urology)  Indicate any recent Medical Services you may have received from other than [facility] providers in the past year (date may be approximate).     Assessment:   This is a routine wellness examination for Zachary [name].  Hearing/Vision screen No results found.   Goals Addressed   None    Depression Screen    09/04/2023    8:26 AM 09/02/2022    8:22 AM 08/29/2021    7:46 AM 08/17/2021    7:08 AM 09/27/2020    9:21 AM 01/26/2014    2:38 PM  PHQ 2/9 Scores  PHQ - 2 Score 0 0 0 0 0 0    Fall Risk    08/28/2023    9:03 AM 03/28/2023    8:10 AM 09/02/2022    8:22 AM 08/29/2021    7:45 AM 01/22/2021   11:26 AM  Fall Risk   Falls in the past year? 1 0 0 0 0  Number falls in past yr: 0 0 0 0 0  Injury with Fall? 0 0 0 0 0  Risk for fall due to : History of fall(s)  No Fall Risks    Follow up Falls evaluation completed  Falls evaluation completed Falls prevention discussed     MEDICARE RISK AT HOME: Medicare Risk at Home Any stairs in or around the home?: Yes If so, are there any without handrails?: No Home free of loose throw rugs in walkways, pet beds, electrical cords, etc?: Yes Adequate lighting in your home to reduce risk of falls?: Yes Life alert?: No Use of a cane, walker or w/c?: No Grab bars in the bathroom?: Yes Shower chair or bench in shower?: No Elevated toilet seat or a handicapped toilet?: No  TIMED UP AND GO:  Was the test performed?  No    Cognitive Function:      11/01/2020    2:00 PM  Montreal Cognitive Assessment   Visuospatial/ Executive (0/5) 2  Naming (0/3) 3  Attention: Read list of digits (0/2) 1  Attention: Read list of letters (0/1) 1  Attention: Serial 7 subtraction starting at 100 (0/3) 3  Language: Repeat phrase (0/2) 0  Language : Fluency (0/1) 0  Abstraction (0/2) 0  Delayed Recall  (0/5) 3  Orientation (0/6) 6  Total 19  Adjusted Score (based on education) 20      09/04/2023    8:27 AM 09/02/2022    8:27 AM  6CIT Screen  What Year? 0 points 0 points  What month? 0 points 0 points  What time? 0 points 0 points  Count back from 20 0 points 0 points  Months in reverse 0 points 0 points  Repeat phrase 0 points 0 points  Total Score 0 points 0 points    Immunizations Immunization History  Administered Date(s) Administered   Fluad Quad(high Dose 65+) 09/27/2020, 10/03/2021, 12/13/2022   Influenza Whole 11/06/2010, 10/24/2011, 09/22/2012   Influenza,inj,Quad PF,6+ Mos 09/07/2019   Influenza-Unspecified 09/22/2014, 09/23/2015, 09/17/2016, 09/09/2017   PFIZER Comirnaty(Gray Top)Covid-19 Tri-Sucrose Vaccine 04/05/2021   PFIZER(Purple Top)SARS-COV-2 Vaccination 01/14/2020, 02/03/2020, 09/15/2020   Pfizer Covid-19 Vaccine Bivalent Booster 86yrs & up 09/13/2021   Pneumococcal Polysaccharide-23 01/14/2019, 04/14/2020   Td 10/17/2008   Tdap 01/07/2013   Zoster, Live 08/08/2015    TDAP status: Due, Education has been provided regarding the importance of this vaccine. Advised may receive this vaccine at local pharmacy or Health Dept. Aware to provide  a copy of the vaccination record if obtained from local pharmacy or Health Dept. Verbalized acceptance and understanding.  Flu Vaccine status: Due, Education has been provided regarding the importance of this vaccine. Advised may receive this vaccine at local pharmacy or Health Dept. Aware to provide a copy of the vaccination record if obtained from local pharmacy or Health Dept. Verbalized acceptance and understanding.  Pneumococcal vaccine status: Due, Education has been provided regarding the importance of this vaccine. Advised may receive this vaccine at local pharmacy or Health Dept. Aware to provide a copy of the vaccination record if obtained from local pharmacy or Health Dept. Verbalized acceptance and  understanding.  Covid-19 vaccine status: Information provided on how to obtain vaccines.   Qualifies for Shingles Vaccine? Yes   Zostavax completed Yes   Shingrix Completed?: No.    Education has been provided regarding the importance of this vaccine. Patient has been advised to call insurance company to determine out of pocket expense if they have not yet received this vaccine. Advised may also receive vaccine at local pharmacy or Health Dept. Verbalized acceptance and understanding.  Screening Tests Health Maintenance  Topic Date Due   Zoster Vaccines- Shingrix (1 of 2) 12/01/1972   Pneumonia Vaccine 30+ Years old (2 of 2 - PCV) 04/14/2021   DTaP/Tdap/Td (3 - Td or Tdap) 01/07/2023   INFLUENZA VACCINE  07/24/2023   COVID-19 Vaccine (6 - 2023-24 season) 08/24/2023   Medicare Annual Wellness (AWV)  09/03/2023   Colonoscopy  01/15/2032   Hepatitis C Screening  Completed   HPV VACCINES  Aged Out    Health Maintenance  Health Maintenance Due  Topic Date Due   Zoster Vaccines- Shingrix (1 of 2) 12/01/1972   Pneumonia Vaccine 37+ Years old (2 of 2 - PCV) 04/14/2021   DTaP/Tdap/Td (3 - Td or Tdap) 01/07/2023   INFLUENZA VACCINE  07/24/2023   COVID-19 Vaccine (6 - 2023-24 season) 08/24/2023   Medicare Annual Wellness (AWV)  09/03/2023    Colorectal cancer screening: Type of screening: Colonoscopy. Completed 01/14/22. Repeat every 10 years  Lung Cancer Screening: (Low Dose CT Chest recommended if Age 32-80 years, 20 pack-year currently smoking OR have quit w/in 15years.) does not qualify.   Additional Screening:  Hepatitis C Screening: does qualify; Completed 08/01/15  Vision Screening: Recommended annual ophthalmology exams for early detection of glaucoma and other disorders of the eye. Is the patient up to date with their annual eye exam?  Yes  Who is the provider or what is the name of the office in which the patient attends annual eye exams? Dr. Norva Pavlov If pt is not  established with a provider, would they like to be referred to a provider to establish care? No .   Dental Screening: Recommended annual dental exams for proper oral hygiene  Diabetic Foot Exam: N/a  Community Resource Referral / Chronic Care Management: CRR required this visit?  No   CCM required this visit?  No     Plan:     I have personally reviewed and noted the following in the patient's chart:   Medical and social history Use of alcohol, tobacco or illicit drugs  Current medications and supplements including opioid prescriptions. Patient is not currently taking opioid prescriptions. Functional ability and status Nutritional status Physical activity Advanced directives List of other physicians Hospitalizations, surgeries, and ER visits in previous 12 months Vitals Screenings to include cognitive, depression, and falls Referrals and appointments  In addition, I have reviewed and  discussed with patient certain preventive protocols, quality metrics, and best practice recommendations. A written personalized care plan for preventive services as well as general preventive health recommendations were provided to patient.     Donne Anon, CMA   09/04/2023   After Visit Summary: (MyChart) Due to this being a telephonic visit, the after visit summary with patients personalized plan was offered to patient via MyChart   Nurse Notes: None

## 2023-09-11 ENCOUNTER — Other Ambulatory Visit: Payer: Self-pay

## 2023-09-11 ENCOUNTER — Inpatient Hospital Stay (HOSPITAL_COMMUNITY): Payer: Medicare Other | Admitting: Physician Assistant

## 2023-09-11 ENCOUNTER — Inpatient Hospital Stay (HOSPITAL_BASED_OUTPATIENT_CLINIC_OR_DEPARTMENT_OTHER): Payer: Medicare Other | Admitting: Anesthesiology

## 2023-09-11 ENCOUNTER — Inpatient Hospital Stay (HOSPITAL_COMMUNITY)
Admission: RE | Admit: 2023-09-11 | Discharge: 2023-09-13 | DRG: 331 | Disposition: A | Discharge status: Home / Self Care | Payer: Medicare Other | Source: Ambulatory Visit | Attending: Surgery | Admitting: Surgery

## 2023-09-11 ENCOUNTER — Encounter (HOSPITAL_COMMUNITY): Payer: Self-pay | Admitting: Surgery

## 2023-09-11 ENCOUNTER — Encounter (HOSPITAL_COMMUNITY)
Admission: RE | Disposition: A | Discharge status: Home / Self Care | Payer: Self-pay | Source: Ambulatory Visit | Attending: Surgery

## 2023-09-11 DIAGNOSIS — R1032 Left lower quadrant pain: Secondary | ICD-10-CM | POA: Diagnosis not present

## 2023-09-11 DIAGNOSIS — Z888 Allergy status to other drugs, medicaments and biological substances status: Secondary | ICD-10-CM | POA: Diagnosis not present

## 2023-09-11 DIAGNOSIS — Z8042 Family history of malignant neoplasm of prostate: Secondary | ICD-10-CM

## 2023-09-11 DIAGNOSIS — Z886 Allergy status to analgesic agent status: Secondary | ICD-10-CM

## 2023-09-11 DIAGNOSIS — I10 Essential (primary) hypertension: Secondary | ICD-10-CM | POA: Diagnosis present

## 2023-09-11 DIAGNOSIS — E785 Hyperlipidemia, unspecified: Secondary | ICD-10-CM | POA: Diagnosis present

## 2023-09-11 DIAGNOSIS — Z8546 Personal history of malignant neoplasm of prostate: Secondary | ICD-10-CM | POA: Diagnosis not present

## 2023-09-11 DIAGNOSIS — K5732 Diverticulitis of large intestine without perforation or abscess without bleeding: Secondary | ICD-10-CM | POA: Diagnosis not present

## 2023-09-11 DIAGNOSIS — Z825 Family history of asthma and other chronic lower respiratory diseases: Secondary | ICD-10-CM | POA: Diagnosis not present

## 2023-09-11 DIAGNOSIS — Z83719 Family history of colon polyps, unspecified: Secondary | ICD-10-CM | POA: Diagnosis not present

## 2023-09-11 DIAGNOSIS — H9193 Unspecified hearing loss, bilateral: Secondary | ICD-10-CM | POA: Diagnosis present

## 2023-09-11 DIAGNOSIS — E039 Hypothyroidism, unspecified: Secondary | ICD-10-CM

## 2023-09-11 DIAGNOSIS — K627 Radiation proctitis: Secondary | ICD-10-CM | POA: Diagnosis present

## 2023-09-11 DIAGNOSIS — Z8249 Family history of ischemic heart disease and other diseases of the circulatory system: Secondary | ICD-10-CM | POA: Diagnosis not present

## 2023-09-11 DIAGNOSIS — Z811 Family history of alcohol abuse and dependence: Secondary | ICD-10-CM

## 2023-09-11 DIAGNOSIS — Z803 Family history of malignant neoplasm of breast: Secondary | ICD-10-CM

## 2023-09-11 DIAGNOSIS — Z822 Family history of deafness and hearing loss: Secondary | ICD-10-CM | POA: Diagnosis not present

## 2023-09-11 DIAGNOSIS — Z8673 Personal history of transient ischemic attack (TIA), and cerebral infarction without residual deficits: Secondary | ICD-10-CM

## 2023-09-11 DIAGNOSIS — Z9049 Acquired absence of other specified parts of digestive tract: Principal | ICD-10-CM

## 2023-09-11 DIAGNOSIS — K5720 Diverticulitis of large intestine with perforation and abscess without bleeding: Secondary | ICD-10-CM | POA: Diagnosis not present

## 2023-09-11 DIAGNOSIS — M1990 Unspecified osteoarthritis, unspecified site: Secondary | ICD-10-CM | POA: Diagnosis present

## 2023-09-11 DIAGNOSIS — Z01818 Encounter for other preprocedural examination: Secondary | ICD-10-CM

## 2023-09-11 DIAGNOSIS — Z8261 Family history of arthritis: Secondary | ICD-10-CM | POA: Diagnosis not present

## 2023-09-11 DIAGNOSIS — K219 Gastro-esophageal reflux disease without esophagitis: Secondary | ICD-10-CM | POA: Diagnosis present

## 2023-09-11 DIAGNOSIS — Z818 Family history of other mental and behavioral disorders: Secondary | ICD-10-CM

## 2023-09-11 DIAGNOSIS — Z809 Family history of malignant neoplasm, unspecified: Secondary | ICD-10-CM | POA: Diagnosis not present

## 2023-09-11 DIAGNOSIS — I2510 Atherosclerotic heart disease of native coronary artery without angina pectoris: Secondary | ICD-10-CM | POA: Diagnosis not present

## 2023-09-11 DIAGNOSIS — Z974 Presence of external hearing-aid: Secondary | ICD-10-CM | POA: Diagnosis not present

## 2023-09-11 DIAGNOSIS — K5792 Diverticulitis of intestine, part unspecified, without perforation or abscess without bleeding: Secondary | ICD-10-CM | POA: Diagnosis not present

## 2023-09-11 DIAGNOSIS — Z885 Allergy status to narcotic agent status: Secondary | ICD-10-CM | POA: Diagnosis not present

## 2023-09-11 DIAGNOSIS — E782 Mixed hyperlipidemia: Secondary | ICD-10-CM | POA: Diagnosis present

## 2023-09-11 HISTORY — PX: FLEXIBLE SIGMOIDOSCOPY: SHX5431

## 2023-09-11 HISTORY — PX: XI ROBOTIC ASSISTED LOWER ANTERIOR RESECTION: SHX6558

## 2023-09-11 LAB — TYPE AND SCREEN
ABO/RH(D): B NEG
Antibody Screen: NEGATIVE

## 2023-09-11 SURGERY — RESECTION, RECTUM, LOW ANTERIOR, ROBOT-ASSISTED
Anesthesia: General

## 2023-09-11 MED ORDER — ACETAMINOPHEN 500 MG PO TABS
1000.0000 mg | ORAL_TABLET | Freq: Four times a day (QID) | ORAL | Status: DC
  Administered 2023-09-11 – 2023-09-13 (×6): 1000 mg via ORAL
  Filled 2023-09-11 (×6): qty 2

## 2023-09-11 MED ORDER — SODIUM BICARBONATE 650 MG PO TABS
650.0000 mg | ORAL_TABLET | Freq: Every day | ORAL | Status: DC
  Administered 2023-09-12 – 2023-09-13 (×2): 650 mg via ORAL
  Filled 2023-09-11 (×2): qty 1

## 2023-09-11 MED ORDER — ENSURE PRE-SURGERY PO LIQD: 592.0000 mL | Freq: Once | ORAL | Status: DC

## 2023-09-11 MED ORDER — PANTOPRAZOLE SODIUM 40 MG PO TBEC
40.0000 mg | DELAYED_RELEASE_TABLET | Freq: Every day | ORAL | Status: DC
  Administered 2023-09-12 – 2023-09-13 (×2): 40 mg via ORAL
  Filled 2023-09-11 (×2): qty 1

## 2023-09-11 MED ORDER — MELATONIN 5 MG PO TABS: 5.0000 mg | ORAL_TABLET | Freq: Every evening | ORAL | Status: DC | PRN

## 2023-09-11 MED ORDER — HYDRALAZINE HCL 20 MG/ML IJ SOLN: 10.0000 mg | INTRAMUSCULAR | Status: DC | PRN

## 2023-09-11 MED ORDER — ONDANSETRON HCL 4 MG/2ML IJ SOLN
INTRAMUSCULAR | Status: DC | PRN
  Administered 2023-09-11: 4 mg via INTRAVENOUS

## 2023-09-11 MED ORDER — TRAMADOL HCL 50 MG PO TABS
50.0000 mg | ORAL_TABLET | Freq: Four times a day (QID) | ORAL | Status: DC | PRN
  Administered 2023-09-11 – 2023-09-12 (×3): 50 mg via ORAL
  Filled 2023-09-11 (×3): qty 1

## 2023-09-11 MED ORDER — DEXAMETHASONE SODIUM PHOSPHATE 10 MG/ML IJ SOLN
INTRAMUSCULAR | Status: DC | PRN
Start: 2023-09-11 — End: 2023-09-11
  Administered 2023-09-11: 10 mg via INTRAVENOUS

## 2023-09-11 MED ORDER — KETAMINE HCL 10 MG/ML IJ SOLN
INTRAMUSCULAR | Status: AC
  Filled 2023-09-11: qty 1

## 2023-09-11 MED ORDER — HYDROMORPHONE HCL 1 MG/ML IJ SOLN
0.2500 mg | INTRAMUSCULAR | Status: DC | PRN
  Administered 2023-09-11 (×2): 0.5 mg via INTRAVENOUS

## 2023-09-11 MED ORDER — HYDROMORPHONE HCL 1 MG/ML IJ SOLN
0.5000 mg | INTRAMUSCULAR | Status: DC | PRN
  Administered 2023-09-11 – 2023-09-13 (×4): 0.5 mg via INTRAVENOUS
  Filled 2023-09-11 (×3): qty 0.5

## 2023-09-11 MED ORDER — ROCURONIUM BROMIDE 100 MG/10ML IV SOLN
INTRAVENOUS | Status: DC | PRN
  Administered 2023-09-11: 20 mg via INTRAVENOUS
  Administered 2023-09-11: 60 mg via INTRAVENOUS

## 2023-09-11 MED ORDER — CHLORHEXIDINE GLUCONATE 0.12 % MT SOLN
15.0000 mL | Freq: Once | OROMUCOSAL | Status: AC
  Administered 2023-09-11: 15 mL via OROMUCOSAL

## 2023-09-11 MED ORDER — ONDANSETRON HCL 4 MG PO TABS: 4.0000 mg | ORAL_TABLET | Freq: Four times a day (QID) | ORAL | Status: DC | PRN

## 2023-09-11 MED ORDER — FLUTICASONE PROPIONATE 50 MCG/ACT NA SUSP: 2.0000 | Freq: Every day | NASAL | Status: DC | PRN

## 2023-09-11 MED ORDER — DIPHENHYDRAMINE HCL 50 MG/ML IJ SOLN: 12.5000 mg | Freq: Four times a day (QID) | INTRAMUSCULAR | Status: DC | PRN

## 2023-09-11 MED ORDER — KETAMINE HCL 10 MG/ML IJ SOLN
INTRAMUSCULAR | Status: DC | PRN
Start: 2023-09-11 — End: 2023-09-11
  Administered 2023-09-11: 30 mg via INTRAVENOUS
  Administered 2023-09-11: 20 mg via INTRAVENOUS

## 2023-09-11 MED ORDER — BUPIVACAINE-EPINEPHRINE (PF) 0.25% -1:200000 IJ SOLN
INTRAMUSCULAR | Status: DC | PRN
  Administered 2023-09-11: 50 mL

## 2023-09-11 MED ORDER — NEOMYCIN SULFATE 500 MG PO TABS: 1000.0000 mg | ORAL_TABLET | ORAL | Status: DC

## 2023-09-11 MED ORDER — LORATADINE 10 MG PO TABS
10.0000 mg | ORAL_TABLET | Freq: Every day | ORAL | Status: DC
  Administered 2023-09-12 – 2023-09-13 (×2): 10 mg via ORAL
  Filled 2023-09-11 (×2): qty 1

## 2023-09-11 MED ORDER — MIDAZOLAM HCL 2 MG/2ML IJ SOLN
INTRAMUSCULAR | Status: AC
  Filled 2023-09-11: qty 2

## 2023-09-11 MED ORDER — CHLORHEXIDINE GLUCONATE CLOTH 2 % EX PADS: 6.0000 | MEDICATED_PAD | Freq: Once | CUTANEOUS | Status: DC

## 2023-09-11 MED ORDER — POLYETHYLENE GLYCOL 3350 17 GM/SCOOP PO POWD: 1.0000 | Freq: Once | ORAL | Status: DC

## 2023-09-11 MED ORDER — BUPIVACAINE LIPOSOME 1.3 % IJ SUSP
INTRAMUSCULAR | Status: AC
  Filled 2023-09-11: qty 20

## 2023-09-11 MED ORDER — FENTANYL CITRATE (PF) 100 MCG/2ML IJ SOLN
INTRAMUSCULAR | Status: DC | PRN
  Administered 2023-09-11 (×2): 50 ug via INTRAVENOUS

## 2023-09-11 MED ORDER — LIDOCAINE HCL (PF) 2 % IJ SOLN
INTRAMUSCULAR | Status: AC
  Filled 2023-09-11: qty 5

## 2023-09-11 MED ORDER — PROMETHAZINE HCL 25 MG/ML IJ SOLN: 6.2500 mg | INTRAMUSCULAR | Status: DC | PRN

## 2023-09-11 MED ORDER — EPHEDRINE 5 MG/ML INJ
INTRAVENOUS | Status: AC
  Filled 2023-09-11: qty 5

## 2023-09-11 MED ORDER — SODIUM CHLORIDE (PF) 0.9 % IJ SOLN
INTRAMUSCULAR | Status: AC
  Filled 2023-09-11: qty 10

## 2023-09-11 MED ORDER — ORAL CARE MOUTH RINSE: 15.0000 mL | Freq: Once | OROMUCOSAL | Status: AC

## 2023-09-11 MED ORDER — ACETAMINOPHEN 500 MG PO TABS: 1000.0000 mg | ORAL_TABLET | ORAL | Status: DC

## 2023-09-11 MED ORDER — 0.9 % SODIUM CHLORIDE (POUR BTL) OPTIME
TOPICAL | Status: DC | PRN
  Administered 2023-09-11: 1000 mL

## 2023-09-11 MED ORDER — ROCURONIUM BROMIDE 10 MG/ML (PF) SYRINGE
PREFILLED_SYRINGE | INTRAVENOUS | Status: AC
  Filled 2023-09-11: qty 10

## 2023-09-11 MED ORDER — LACTATED RINGERS IV SOLN: INTRAVENOUS | Status: DC

## 2023-09-11 MED ORDER — METRONIDAZOLE 500 MG PO TABS: 1000.0000 mg | ORAL_TABLET | ORAL | Status: DC

## 2023-09-11 MED ORDER — LIDOCAINE HCL (CARDIAC) PF 100 MG/5ML IV SOSY
PREFILLED_SYRINGE | INTRAVENOUS | Status: DC | PRN
  Administered 2023-09-11: 40 mg via INTRAVENOUS

## 2023-09-11 MED ORDER — SIMETHICONE 80 MG PO CHEW
40.0000 mg | CHEWABLE_TABLET | Freq: Four times a day (QID) | ORAL | Status: DC | PRN
  Administered 2023-09-12: 40 mg via ORAL
  Filled 2023-09-11: qty 1

## 2023-09-11 MED ORDER — GLYCOPYRROLATE 0.2 MG/ML IJ SOLN
INTRAMUSCULAR | Status: AC
  Filled 2023-09-11: qty 1

## 2023-09-11 MED ORDER — LACTATED RINGERS IR SOLN
Status: DC | PRN
Start: 2023-09-11 — End: 2023-09-11
  Administered 2023-09-11: 1000 mL

## 2023-09-11 MED ORDER — ENSURE PRE-SURGERY PO LIQD: 296.0000 mL | Freq: Once | ORAL | Status: DC

## 2023-09-11 MED ORDER — ACETAMINOPHEN 500 MG PO TABS
1000.0000 mg | ORAL_TABLET | Freq: Once | ORAL | Status: AC
  Administered 2023-09-11: 1000 mg via ORAL
  Filled 2023-09-11: qty 2

## 2023-09-11 MED ORDER — HEPARIN SODIUM (PORCINE) 5000 UNIT/ML IJ SOLN
5000.0000 [IU] | Freq: Once | INTRAMUSCULAR | Status: AC
  Administered 2023-09-11: 5000 [IU] via SUBCUTANEOUS
  Filled 2023-09-11: qty 1

## 2023-09-11 MED ORDER — EPHEDRINE SULFATE-NACL 50-0.9 MG/10ML-% IV SOSY
PREFILLED_SYRINGE | INTRAVENOUS | Status: DC | PRN
  Administered 2023-09-11: 10 mg via INTRAVENOUS

## 2023-09-11 MED ORDER — HEPARIN SODIUM (PORCINE) 5000 UNIT/ML IJ SOLN
5000.0000 [IU] | Freq: Three times a day (TID) | INTRAMUSCULAR | Status: DC
  Administered 2023-09-11 – 2023-09-13 (×5): 5000 [IU] via SUBCUTANEOUS
  Filled 2023-09-11 (×5): qty 1

## 2023-09-11 MED ORDER — ONDANSETRON HCL 4 MG/2ML IJ SOLN: 4.0000 mg | Freq: Four times a day (QID) | INTRAMUSCULAR | Status: DC | PRN

## 2023-09-11 MED ORDER — LEVOTHYROXINE SODIUM 100 MCG PO TABS
100.0000 ug | ORAL_TABLET | Freq: Every evening | ORAL | Status: DC
  Administered 2023-09-11 – 2023-09-12 (×2): 100 ug via ORAL
  Filled 2023-09-11 (×2): qty 1

## 2023-09-11 MED ORDER — HYDROMORPHONE HCL 1 MG/ML IJ SOLN
INTRAMUSCULAR | Status: AC
  Filled 2023-09-11: qty 1

## 2023-09-11 MED ORDER — ASPIRIN 81 MG PO TBEC
81.0000 mg | DELAYED_RELEASE_TABLET | Freq: Every day | ORAL | Status: DC
  Administered 2023-09-12 – 2023-09-13 (×2): 81 mg via ORAL
  Filled 2023-09-11 (×2): qty 1

## 2023-09-11 MED ORDER — ENSURE SURGERY PO LIQD
237.0000 mL | Freq: Two times a day (BID) | ORAL | Status: DC
  Administered 2023-09-12 (×2): 237 mL via ORAL

## 2023-09-11 MED ORDER — MIDAZOLAM HCL 2 MG/2ML IJ SOLN: 0.5000 mg | Freq: Once | INTRAMUSCULAR | Status: DC | PRN

## 2023-09-11 MED ORDER — PROPOFOL 10 MG/ML IV BOLUS
INTRAVENOUS | Status: AC
  Filled 2023-09-11: qty 20

## 2023-09-11 MED ORDER — PRAVASTATIN SODIUM 20 MG PO TABS
80.0000 mg | ORAL_TABLET | Freq: Every day | ORAL | Status: DC
  Administered 2023-09-11 – 2023-09-12 (×2): 80 mg via ORAL
  Filled 2023-09-11 (×2): qty 4

## 2023-09-11 MED ORDER — BUPIVACAINE LIPOSOME 1.3 % IJ SUSP: 20.0000 mL | Freq: Once | INTRAMUSCULAR | Status: DC

## 2023-09-11 MED ORDER — BISACODYL 5 MG PO TBEC: 20.0000 mg | DELAYED_RELEASE_TABLET | Freq: Once | ORAL | Status: DC

## 2023-09-11 MED ORDER — ALVIMOPAN 12 MG PO CAPS
12.0000 mg | ORAL_CAPSULE | ORAL | Status: AC
  Administered 2023-09-11: 12 mg via ORAL
  Filled 2023-09-11: qty 1

## 2023-09-11 MED ORDER — LIDOCAINE HCL 2 % IJ SOLN
INTRAMUSCULAR | Status: AC
  Filled 2023-09-11: qty 20

## 2023-09-11 MED ORDER — ALVIMOPAN 12 MG PO CAPS
12.0000 mg | ORAL_CAPSULE | Freq: Two times a day (BID) | ORAL | Status: DC
  Administered 2023-09-12: 12 mg via ORAL
  Filled 2023-09-11 (×3): qty 1

## 2023-09-11 MED ORDER — BUPIVACAINE-EPINEPHRINE 0.25% -1:200000 IJ SOLN
INTRAMUSCULAR | Status: AC
  Filled 2023-09-11: qty 1

## 2023-09-11 MED ORDER — IBUPROFEN 400 MG PO TABS
600.0000 mg | ORAL_TABLET | Freq: Four times a day (QID) | ORAL | Status: DC | PRN
  Administered 2023-09-12: 600 mg via ORAL
  Filled 2023-09-11: qty 1

## 2023-09-11 MED ORDER — ORAL CARE MOUTH RINSE: 15.0000 mL | Freq: Once | OROMUCOSAL | Status: DC

## 2023-09-11 MED ORDER — FENTANYL CITRATE (PF) 100 MCG/2ML IJ SOLN
INTRAMUSCULAR | Status: AC
  Filled 2023-09-11: qty 2

## 2023-09-11 MED ORDER — MEPERIDINE HCL 50 MG/ML IJ SOLN: 6.2500 mg | INTRAMUSCULAR | Status: DC | PRN

## 2023-09-11 MED ORDER — DIPHENHYDRAMINE HCL 12.5 MG/5ML PO ELIX: 12.5000 mg | ORAL_SOLUTION | Freq: Four times a day (QID) | ORAL | Status: DC | PRN

## 2023-09-11 MED ORDER — HYDROMORPHONE HCL 2 MG/ML IJ SOLN
INTRAMUSCULAR | Status: AC
  Filled 2023-09-11: qty 1

## 2023-09-11 MED ORDER — SODIUM CHLORIDE 0.9 % IV SOLN
2.0000 g | INTRAVENOUS | Status: AC
  Administered 2023-09-11: 2 g via INTRAVENOUS
  Filled 2023-09-11: qty 2

## 2023-09-11 MED ORDER — SUGAMMADEX SODIUM 200 MG/2ML IV SOLN
INTRAVENOUS | Status: DC | PRN
  Administered 2023-09-11: 200 mg via INTRAVENOUS

## 2023-09-11 MED ORDER — LIDOCAINE HCL (PF) 2 % IJ SOLN
INTRAMUSCULAR | Status: DC | PRN
Start: 2023-09-11 — End: 2023-09-11
  Administered 2023-09-11: 1.5 mg/kg/h via INTRADERMAL

## 2023-09-11 MED ORDER — ALUM & MAG HYDROXIDE-SIMETH 200-200-20 MG/5ML PO SUSP: 30.0000 mL | Freq: Four times a day (QID) | ORAL | Status: DC | PRN

## 2023-09-11 MED ORDER — CHLORHEXIDINE GLUCONATE 0.12 % MT SOLN: 15.0000 mL | Freq: Once | OROMUCOSAL | Status: DC

## 2023-09-11 MED ORDER — PROPOFOL 10 MG/ML IV BOLUS
INTRAVENOUS | Status: DC | PRN
  Administered 2023-09-11: 150 mg via INTRAVENOUS

## 2023-09-11 MED ORDER — HYDROMORPHONE HCL 1 MG/ML IJ SOLN
INTRAMUSCULAR | Status: DC | PRN
Start: 2023-09-11 — End: 2023-09-11
  Administered 2023-09-11: .5 mg via INTRAVENOUS

## 2023-09-11 MED ORDER — MIDAZOLAM HCL 5 MG/5ML IJ SOLN
INTRAMUSCULAR | Status: DC | PRN
  Administered 2023-09-11: 2 mg via INTRAVENOUS

## 2023-09-11 MED ORDER — INDOCYANINE GREEN 25 MG IV SOLR
INTRAVENOUS | Status: DC | PRN
Start: 2023-09-11 — End: 2023-09-11
  Administered 2023-09-11: 7.5 mg via INTRAVENOUS

## 2023-09-11 SURGICAL SUPPLY — 110 items
ADH SKN CLS APL DERMABOND .7 (GAUZE/BANDAGES/DRESSINGS) ×2
APL PRP STRL LF DISP 70% ISPRP (MISCELLANEOUS) ×2
APPLIER CLIP 5 13 M/L LIGAMAX5 (MISCELLANEOUS)
APPLIER CLIP ROT 10 11.4 M/L (STAPLE)
APR CLP MED LRG 11.4X10 (STAPLE)
APR CLP MED LRG 5 ANG JAW (MISCELLANEOUS)
BAG COUNTER SPONGE SURGICOUNT (BAG) IMPLANT
BAG SPNG CNTER NS LX DISP (BAG)
BLADE EXTENDED COATED 6.5IN (ELECTRODE) ×2 IMPLANT
CANNULA REDUCER 12-8 DVNC XI (CANNULA) ×2 IMPLANT
CHLORAPREP W/TINT 26 (MISCELLANEOUS) ×2 IMPLANT
CLIP APPLIE 5 13 M/L LIGAMAX5 (MISCELLANEOUS) IMPLANT
CLIP APPLIE ROT 10 11.4 M/L (STAPLE) IMPLANT
CLIP LIGATING HEM O LOK PURPLE (MISCELLANEOUS) IMPLANT
CLIP LIGATING HEMO O LOK GREEN (MISCELLANEOUS) IMPLANT
COVER SURGICAL LIGHT HANDLE (MISCELLANEOUS) ×4 IMPLANT
COVER TIP SHEARS 8 DVNC (MISCELLANEOUS) ×2 IMPLANT
DEFOGGER SCOPE WARMER CLEARIFY (MISCELLANEOUS) ×2 IMPLANT
DERMABOND ADVANCED .7 DNX12 (GAUZE/BANDAGES/DRESSINGS) IMPLANT
DEVICE TROCAR PUNCTURE CLOSURE (ENDOMECHANICALS) IMPLANT
DRAIN CHANNEL 19F RND (DRAIN) ×2 IMPLANT
DRAPE ARM DVNC X/XI (DISPOSABLE) ×8 IMPLANT
DRAPE COLUMN DVNC XI (DISPOSABLE) ×2 IMPLANT
DRAPE SURG IRRIG POUCH 19X23 (DRAPES) ×2 IMPLANT
DRIVER NDL LRG 8 DVNC XI (INSTRUMENTS) ×2 IMPLANT
DRIVER NDLE LRG 8 DVNC XI (INSTRUMENTS)
DRSG OPSITE POSTOP 4X10 (GAUZE/BANDAGES/DRESSINGS) IMPLANT
DRSG OPSITE POSTOP 4X6 (GAUZE/BANDAGES/DRESSINGS) IMPLANT
DRSG OPSITE POSTOP 4X8 (GAUZE/BANDAGES/DRESSINGS) IMPLANT
DRSG TEGADERM 2-3/8X2-3/4 SM (GAUZE/BANDAGES/DRESSINGS) ×10 IMPLANT
DRSG TEGADERM 4X4.75 (GAUZE/BANDAGES/DRESSINGS) ×2 IMPLANT
ELECT PENCIL ROCKER SW 15FT (MISCELLANEOUS) IMPLANT
ELECT REM PT RETURN 15FT ADLT (MISCELLANEOUS) ×2 IMPLANT
ENDOLOOP SUT PDS II 0 18 (SUTURE) IMPLANT
EVACUATOR SILICONE 100CC (DRAIN) ×2 IMPLANT
FORCEPS BPLR FENES DVNC XI (FORCEP) IMPLANT
GAUZE SPONGE 2X2 8PLY STRL LF (GAUZE/BANDAGES/DRESSINGS) ×2 IMPLANT
GAUZE SPONGE 4X4 12PLY STRL (GAUZE/BANDAGES/DRESSINGS) IMPLANT
GLOVE BIO SURGEON STRL SZ7.5 (GLOVE) ×6 IMPLANT
GLOVE INDICATOR 8.0 STRL GRN (GLOVE) ×6 IMPLANT
GOWN SRG XL LVL 4 BRTHBL STRL (GOWNS) ×2 IMPLANT
GOWN STRL NON-REIN XL LVL4 (GOWNS) ×2
GOWN STRL REUS W/ TWL XL LVL3 (GOWN DISPOSABLE) ×10 IMPLANT
GOWN STRL REUS W/TWL XL LVL3 (GOWN DISPOSABLE) ×10
GRASPER SUT TROCAR 14GX15 (MISCELLANEOUS) IMPLANT
GRASPER TIP-UP FEN DVNC XI (INSTRUMENTS) ×2 IMPLANT
HOLDER FOLEY CATH W/STRAP (MISCELLANEOUS) ×2 IMPLANT
IRRIG SUCT STRYKERFLOW 2 WTIP (MISCELLANEOUS) ×2
IRRIGATION SUCT STRKRFLW 2 WTP (MISCELLANEOUS) ×2 IMPLANT
KIT PROCEDURE DVNC SI (MISCELLANEOUS) IMPLANT
KIT TURNOVER KIT A (KITS) IMPLANT
NDL INSUFFLATION 14GA 120MM (NEEDLE) ×2 IMPLANT
NEEDLE INSUFFLATION 14GA 120MM (NEEDLE) ×2
PACK CARDIOVASCULAR III (CUSTOM PROCEDURE TRAY) ×2 IMPLANT
PACK COLON (CUSTOM PROCEDURE TRAY) ×2 IMPLANT
PAD POSITIONING PINK XL (MISCELLANEOUS) ×2 IMPLANT
PENCIL SMOKE EVACUATOR (MISCELLANEOUS) IMPLANT
PROTECTOR NERVE ULNAR (MISCELLANEOUS) ×4 IMPLANT
RELOAD STAPLE 45 3.5 BLU DVNC (STAPLE) IMPLANT
RELOAD STAPLE 45 4.3 GRN DVNC (STAPLE) IMPLANT
RELOAD STAPLE 60 3.5 BLU DVNC (STAPLE) IMPLANT
RELOAD STAPLE 60 4.3 GRN DVNC (STAPLE) IMPLANT
RETRACTOR WND ALEXIS 18 MED (MISCELLANEOUS) IMPLANT
RTRCTR WOUND ALEXIS 18CM MED (MISCELLANEOUS)
SCISSORS LAP 5X35 DISP (ENDOMECHANICALS) IMPLANT
SCISSORS MNPLR CVD DVNC XI (INSTRUMENTS) ×2 IMPLANT
SEAL UNIV 5-12 XI (MISCELLANEOUS) ×8 IMPLANT
SEALER VESSEL EXT DVNC XI (MISCELLANEOUS) ×2 IMPLANT
SLEEVE ADV FIXATION 5X100MM (TROCAR) IMPLANT
SOL ELECTROSURG ANTI STICK (MISCELLANEOUS) ×2
SOLUTION ELECTROSURG ANTI STCK (MISCELLANEOUS) ×2 IMPLANT
SPIKE FLUID TRANSFER (MISCELLANEOUS) ×2 IMPLANT
STAPLER 60 SUREFORM DVNC (STAPLE) IMPLANT
STAPLER ECHELON POWER CIR 29 (STAPLE) IMPLANT
STAPLER ECHELON POWER CIR 31 (STAPLE) IMPLANT
STAPLER RELOAD 3.5X45 BLU DVNC (STAPLE)
STAPLER RELOAD 3.5X60 BLU DVNC (STAPLE)
STAPLER RELOAD 4.3X45 GRN DVNC (STAPLE)
STAPLER RELOAD 4.3X60 GRN DVNC (STAPLE) ×2
STOPCOCK 4 WAY LG BORE MALE ST (IV SETS) ×4 IMPLANT
SURGILUBE 2OZ TUBE FLIPTOP (MISCELLANEOUS) ×2 IMPLANT
SUT MNCRL AB 4-0 PS2 18 (SUTURE) ×2 IMPLANT
SUT PDS AB 1 CT1 27 (SUTURE) IMPLANT
SUT PDS AB 1 TP1 96 (SUTURE) IMPLANT
SUT PROLENE 0 CT 2 (SUTURE) IMPLANT
SUT PROLENE 2 0 KS (SUTURE) ×2 IMPLANT
SUT PROLENE 2 0 SH DA (SUTURE) IMPLANT
SUT SILK 2 0 (SUTURE) ×2
SUT SILK 2 0 SH CR/8 (SUTURE) IMPLANT
SUT SILK 2-0 18XBRD TIE 12 (SUTURE) IMPLANT
SUT SILK 3 0 (SUTURE) ×2
SUT SILK 3 0 SH CR/8 (SUTURE) ×2 IMPLANT
SUT SILK 3-0 18XBRD TIE 12 (SUTURE) ×2 IMPLANT
SUT V-LOC BARB 180 2/0GR6 GS22 (SUTURE)
SUT VIC AB 3-0 SH 18 (SUTURE) IMPLANT
SUT VIC AB 3-0 SH 27 (SUTURE)
SUT VIC AB 3-0 SH 27XBRD (SUTURE) IMPLANT
SUT VICRYL 0 UR6 27IN ABS (SUTURE) ×2 IMPLANT
SUTURE V-LC BRB 180 2/0GR6GS22 (SUTURE) IMPLANT
SYR 10ML LL (SYRINGE) ×2 IMPLANT
SYS LAPSCP GELPORT 120MM (MISCELLANEOUS)
SYS WOUND ALEXIS 18CM MED (MISCELLANEOUS) ×2
SYSTEM LAPSCP GELPORT 120MM (MISCELLANEOUS) IMPLANT
SYSTEM WOUND ALEXIS 18CM MED (MISCELLANEOUS) ×2 IMPLANT
TAPE UMBILICAL 1/8 X36 TWILL (MISCELLANEOUS) ×2 IMPLANT
TOWEL OR NON WOVEN STRL DISP B (DISPOSABLE) ×2 IMPLANT
TRAY FOLEY MTR SLVR 16FR STAT (SET/KITS/TRAYS/PACK) ×2 IMPLANT
TROCAR ADV FIXATION 5X100MM (TROCAR) ×2 IMPLANT
TUBING CONNECTING 10 (TUBING) ×6 IMPLANT
TUBING INSUFFLATION 10FT LAP (TUBING) ×2 IMPLANT

## 2023-09-11 NOTE — Anesthesia Procedure Notes (Signed)
Procedure Name: Intubation Date/Time: 09/11/2023 11:45 AM  Performed by: Nathen May, CRNAPre-anesthesia Checklist: Patient identified, Emergency Drugs available, Suction available and Patient being monitored Patient Re-evaluated:Patient Re-evaluated prior to induction Oxygen Delivery Method: Circle System Utilized Preoxygenation: Pre-oxygenation with 100% oxygen Induction Type: IV induction Ventilation: Mask ventilation without difficulty Laryngoscope Size: Mac and 3 Grade View: Grade II Tube type: Oral Tube size: 7.5 mm Number of attempts: 1 Airway Equipment and Method: Stylet and Oral airway Placement Confirmation: ETT inserted through vocal cords under direct vision, positive ETCO2 and breath sounds checked- equal and bilateral Secured at: 23 cm Tube secured with: Tape Dental Injury: Teeth and Oropharynx as per pre-operative assessment

## 2023-09-11 NOTE — Anesthesia Postprocedure Evaluation (Signed)
Anesthesia Post Note  Patient: Sreeram Corsino Monk  Procedure(s) Performed: XI ROBOTIC ASSISTED LOW ANTERIOR RESECTION/ SIGMOIDECTOMY AND INTRAOPERATIVE ASSESSMENT OF PERFUSION USING ICG DYE FLEXIBLE SIGMOIDOSCOPY     Patient location during evaluation: PACU Anesthesia Type: General Level of consciousness: awake and alert, patient cooperative and oriented Pain management: pain level controlled Vital Signs Assessment: post-procedure vital signs reviewed and stable Respiratory status: spontaneous breathing, nonlabored ventilation and respiratory function stable Cardiovascular status: blood pressure returned to baseline and stable Postop Assessment: no apparent nausea or vomiting Anesthetic complications: no   No notable events documented.  Last Vitals:  Vitals:   09/11/23 1515 09/11/23 1530  BP: (!) 146/85 (!) 144/94  Pulse: 63 66  Resp: 13 11  Temp: (!) 36.4 C   SpO2: 94% 95%    Last Pain:  Vitals:   09/11/23 1532  TempSrc:   PainSc: 6                  Dustyn Armbrister,E. Cutter Passey

## 2023-09-11 NOTE — Op Note (Signed)
PATIENT: Zachary [name]  72 y.o. male  Patient Care Team: Carmelia Roller, Jilda Roche, DO as PCP - General (Family Medicine) Quintella Reichert, MD as PCP - Cardiology (Cardiology) Hodgin, Acie Fredrickson, MD (Inactive) (Internal Medicine) Marcine Matar, MD as Consulting Physician (Urology)  PREOP DIAGNOSIS: RECURRENT DIVERTICULITIS  POSTOP DIAGNOSIS: RECURRENT DIVERTICULITIS  PROCEDURE:  Robotic assisted low anterior resection with double stapled colorectal anastomosis Intraoperative assessment of perfusion using ICG fluorescence imaging Flexible sigmoidoscopy Bilateral transversus abdominus plane (TAP) blocks  SURGEON: Stephanie Coup. Greydis Stlouis, MD  ASSISTANT: Karie Soda, MD  ANESTHESIA: General endotracheal  EBL: 30 mL Total I/O In: 900 [I.V.:800; IV Piggyback:100] Out: 130 [Urine:100; Blood:30]  DRAINS: None  SPECIMEN: Rectosigmoid colon, open and proximal  COUNTS: Sponge, needle and instrument counts were reported correct x2  FINDINGS: Thickening and adhesions around the mid and more proximal sigmoid colon consistent with prior attacks of diverticulitis.  No abscesses or large phlegmon was encountered.  A well perfused, tension free, hemostatic, air tight 29 mm EEA colorectal anastomosis fashioned 14 cm from the anal verge by flexible sigmoidoscopy.   NARRATIVE: Informed consent was verified. The patient was taken to the operating room, placed supine on the operating table and SCD's were applied. General endotracheal anesthesia was induced without difficulty.  He was then positioned in the lithotomy position with Allen stirrups.  Pressure points were evaluated and padded.  A foley catheter was then placed by nursing under sterile conditions. Hair on the abdomen was clipped.  He was secured to the operating table. The abdomen was then prepped and draped in the standard sterile fashion. Surgical timeout was called indicating the correct patient, procedure, positioning and need for  preoperative antibiotics.   An OG tube was placed by anesthesia and confirmed to be to suction.  At Palmer's point, a stab incision was created and the Veress needle was introduced into the peritoneal cavity on the first attempt.  Intraperitoneal location was confirmed by the aspiration and saline drop test.  Pneumoperitoneum was established to a maximum pressure of 15 mmHg using CO2.  Following this, the abdomen was marked for planned trocar sites.  Just to the right and cephalad to the umbilicus, an 8 mm incision was created and an 8 mm blunt tipped robotic trocar was cautiously placed into the peritoneal cavity.  The laparoscope was inserted and demonstrated no evidence of trocar site nor Veress needle site complications.  The Veress needle was removed.  Bilateral transversus abdominis plane blocks were then created using a dilute mixture of Exparel with Marcaine.  3 additional 8 mm robotic trochars were placed under direct visualization roughly in a line extending from the right ASIS towards the left upper quadrant. The bladder was inspected and noted to be at/below the pubic symphysis.  Staying 3 fingerbreadths above the pubic symphysis, an incision was created and the 12 mm robotic trocar inserted directed cephalad into the peritoneal cavity under direct visualization.  An additional 5 mm assist port was placed in the right lateral abdomen under direct visualization.  The abdomen was surveyed and there was swelling omental adhesion across the periumbilical region consistent with prior surgery.  There are also adhesions of the sigmoid and the left lower quadrant consistent with history of diverticulitis.  He was positioned in Trendelenburg with the left side tilted slightly up.  Small bowel was carefully retracted out of the pelvis.  The robot was then docked and I went to the console.   Omental adhesions across the periumbilical region were  lysed sharply without difficulty.  There is no evident bowel  within these adhesions.  The sigmoid colon was readily identified.  It is chronically thickened/scarred in appearance particular along the mid and more proximal portions of the sigmoid.  There is no evident perforation/abscess or large phlegmon.  Attachments of the sigmoid colon were taken down from the intersigmoid fossa.  The rectosigmoid colon was grasped and elevated anteriorly.  Beginning with a medial to lateral approach, the peritoneum overlying the presacral space was carefully incised.  The TME plane was readily gained working in a plane between the fascia propria of the rectum and the presacral fascia.  Hypogastric nerves were seen going along the the presacral fascia and were protected free of injury.  Working more proximally, the mesorectum and sigmoid mesentery were carefully mobilized off of the peritoneum.  The left ureter was identified and protected free of injury.  The left gonadal vessels were identified and protected.  These were both swept "down."  The superior hemorrhoidal and IMA pedicles were identified. Further mesocolon was mobilized proximally staying in this plane between the retroperitoneum proper and the mesocolon. Attention was then turned to the lateral portion of dissection.  The sigmoid colon was then retracted to the right.  The sigmoid colon was fully mobilized. The descending colon was mobilized by incising the Gracelyn Coventry line of Toldt.  This was done all the way up to the level of the splenic flexure.  The associated mesocolon was also mobilized medially.  The left ureter again was confirmed to be well away from the vasculature which had been dissected medially.  The rectosigmoid colon was elevated anteriorly. The left ureter was re-identified along with gonadal vessels. The IMA was clear of this. The IMA was then divided with the vessel sealer. The stump was inspected and noted to be completely hemostatic with a good seal.  The mesentery was divided out to the point of planned  proximal division.  Working more distally, the rectum was identified where the tinea had splayed and there were loss of appendices epiploica.  This also corresponded to a location overlying the sacral promontory.  Anatomically, this clearly represents the proximal rectum.  The mesentery out to this level was then cleared using the vessel sealer. The distal point of transection on the proximal rectum was identified.   A 60 mm green load robotic stapler was then placed through the 12 mm port and introduced into the peritoneal cavity.  The rectum was divided with a single firing of the stapler.  The stump was intact and healthy in appearance.   Attention was turned to performing a perfusion test. ICG was administered by anesthesia and at the level of the cleared mesentery proximally, there was excellent uptake of the tracer.  The rectum was also well perfused in appearance.  There was a visible pulse in the mesentery out to the level of the cleared colon at the level of the proximal sigmoid/descending colon junction.  This colon is also supple and healthy in appearance without any thickening.  This reached into the pelvis without any difficulty and remained in that location without any tension. A locking grasper was then placed on the sigmoid staple line.   Attention was turned to the extracorporeal portion of the procedure.  The robot was undocked.  I scrubbed back in.  Using the 12 mm trocar site, a Pfannenstiel incision was created and incorporated the fascial opening through the 12 mm port site.  The rectus fascia was incised and  then elevated.  The rectus muscle was mobilized free of the overlying fascia.  The peritoneum was incised in the midline well above the location of the bladder.  An Alexis wound protector was placed.  Towels were placed around the field.  The divided colon was passed through the wound protector.  The point of proximal division was identified and was again on a healthy segment of  supple colon with a palpable pulse in the mesentery. This was pink in color.  A pursestring device was applied.  A 2-0 Prolene on a Keith needle was passed.  The colon was divided and passed off with the open end being proximal.  EEA sizers were then introduced and a 29 mm EEA selected.  "Belt loops" consisting of 3-0 silk were placed around the pursestring suture line.  The anvil was placed and the pursestring tied.  A small amount of fat was cleared from the planned anastomosis and no diverticula were apparent within this.  This was placed back into the abdomen and a cap placed over the wound protector port site.  Pneumoperitoneum was reestablished.  I then went below to pass the stapler.  My partner remained above.  EEA sizers were cautiously introduced via the anus and advanced under direct visualization.  The stapler was passed and the spike deployed just anterior to the staple line.  The components were then mated.  Orientation was confirmed such that there is no twisting of the colon nor small bowel underneath the mesenteric defect. Care was taken to ensure no other structures were incorporated within this either.  The stapler was then closed, held, and fired. This was then removed. The donuts were inspected and noted to be complete.  The colon proximal to the anastomosis was then gently occluded. The pelvis was filled with sterile irrigation. Under direct visualization, I passed a flexible sigmoidoscope.  The anastomosis was under water.  With good distention of the anastomosis there was no air leak. The anastomosis pink in appearance.  This is located at 14 cm from the anal verge by flexible sigmoidoscopy.  It is hemostatic.  Additionally, looking from above, there is no tension on the colon or mesentery.  Sigmoidoscope was withdrawn.  Irrigation was evacuated from the pelvis.  The abdomen and pelvis are surveyed and noted to be completely hemostatic without any apparent injury.  Under direct visualization,  all trochars are removed.  The Alexis wound protector was removed.  Gowns/gloves are changed and a fresh set of clean instruments utilized. Additional sterile drapes were placed around the field.   The Pfannenstiel peritoneum was closed with a running 2-0 Vicryl suture.  The rectus fascia was then closed using 2 running #1 PDS sutures.  The fascia was then palpated and noted to be completely closed.  Additional anesthetic was infiltrated at the Pfannenstiel site.  Sponge, needle, and instrument counts were reported correct x2. 4-0 Monocryl subcuticular suture was used to close the skin of all incision sites.  Dermabond was placed over all incisions.  A honeycomb dressing placed over the Pfannenstiel as well.   He was then taken out of lithotomy, awakened from anesthesia, extubated, and transferred to a stretcher for transport to PACU in satisfactory condition having tolerated the procedure well.

## 2023-09-11 NOTE — H&P (Signed)
CC: Here today for surgery  HPI: Zachary [name] is an 72 y.o. male whom was seen in the office today as a referral by Dr. Carmelia Roller for evaluation of history of diverticulitis.  CT A/P 05/23/23  Diverticulitis of proximal sigmoid colon is noted without abscess formation.  CT A/P 10/2021 1. No current diverticulitis or findings of colitis. 2. Hazy stranding in the central mesentery with scattered small nodes and slight displacement of bowel. The appearance favors sclerosing mesenteritis. 3. Other imaging findings of potential clinical significance: Aortic Atherosclerosis (ICD10-I70.0). Right coronary artery atherosclerosis. Prostatectomy. Lumbar spondylosis and degenerative disc disease.  CT A/P 07/2021 Acute sigmoid diverticulitis. No perforation or abscess.   Colonoscopy 12/2021 (Dr. Marina Goodell) - Multiple diverticula were found in the sigmoid colon. Very mild radiation proctitis. Small internal hemorrhoids.  - The entire examined colon appeared otherwise normal on direct and retroflexion views. Biopsies for histology were taken with a cold forceps from the entire colon for evaluation of microscopic colitis.  He is here today with his wife. Reports that over the last 10 or so years he has had at least 7-8 attacks of presumed diverticulitis. These have always been left lower quadrant pain that will last on the order of days. Typically improved with antibiotics.  He has had 4 attacks since 2018. His most recent attack was last month. He is still recovering from this from the left lower quadrant discomfort standpoint. Still has some intermittent discomfort. No nausea, vomiting, fever, chills. He denies any blood in his stool. He reports he has 3 soft bowel movements per day. He denies any stool softeners, laxatives, or fiber supplementation. Struggles to drink enough water.  He does follow with Dr. Arva Chafe as PCP  PMH: Hypothyroidism, GERD, HLD, allergies, TIA (2021)  PSH:  Robotic prostatectomy-Dr. Ardeen Garland had prostate radiation following surgery. Denies any other abdominal or pelvic surgical history.   He denies any changes in health or health history since we met in the office. No new medications/allergies. He states he is ready for surgery today.  Past Medical History:  Diagnosis Date   Allergy Seasonal/dust   take Loratadine 10 mg daily   Arthritis    Articular cartilage disease    left shoulder   CAD (coronary artery disease)    Diverticulitis    Dyslipidemia (high LDL; low HDL) 08/15/2016   GERD (gastroesophageal reflux disease)    Hearing problem    hearing deficit   Hemorrhoids    History of back surgery    Hypertension    off meds since 05/25/23 -   Hypothyroidism    Prostate cancer (HCC) 2015   treated and in remission   Sclerosing mesenteritis (HCC)    diagnosed 2022   Sleep apnea    uses a cpap   Stroke (HCC)    TIA -affected balance and went through PT.   Wears glasses    Wears hearing aid    both ears    Past Surgical History:  Procedure Laterality Date   BACK SURGERY  12/23/1976   herniated disksurgery   CARDIAC CATHETERIZATION  08/01/2010   COLONOSCOPY     2007, 2017 and 2023   EXAM UNDER ANESTHESIA WITH MANIPULATION OF SHOULDER Right 09/15/2014   Procedure: RIGHT SHOULDER MANIPULATION UNDER ANESTHESIA;  Surgeon: Loreta Ave, MD;  Location: [facility];  Service: Orthopedics;  Laterality: Right;   HEMORRHOID SURGERY  08/23/2006   LEFT HEART CATH AND CORONARY ANGIOGRAPHY N/A 01/16/2021   Procedure: LEFT HEART  CATH AND CORONARY ANGIOGRAPHY;  Surgeon: Lennette Bihari, MD;  Location: [facility];  Service: Cardiovascular;  Laterality: N/A;   SHOULDER ARTHROSCOPY WITH DISTAL CLAVICLE RESECTION Left 07/07/2015   Procedure: SHOULDER ARTHROSCOPY WITH DISTAL CLAVICLE RESECTION;  Surgeon: Mckinley Jewel, MD;  Location: [facility];  Service: Orthopedics;  Laterality: Left;   SHOULDER  ARTHROSCOPY WITH ROTATOR CUFF REPAIR Left 12/07/2015   Procedure: LEFT SHOULDER ARTHROSCOPY DEBRIDEMENT, WITH ROTATOR CUFF REPAIR;  Surgeon: Loreta Ave, MD;  Location: [facility];  Service: Orthopedics;  Laterality: Left;   SHOULDER ARTHROSCOPY WITH ROTATOR CUFF REPAIR AND SUBACROMIAL DECOMPRESSION Left 07/07/2015   Procedure: LEFT SHOULDER SCOPE DEBRIDEMENT, SUBACROMIAL DECOMPRESSION, DISTAL CLAVICULECTOMY, ROTATOR CUFF REPAIR  ;  Surgeon: Mckinley Jewel, MD;  Location: [facility];  Service: Orthopedics;  Laterality: Left;  ANESTHESIA: GENERAL, PRE/POST OP SCALENE   SHOULDER ARTHROSCOPY WITH SUBACROMIAL DECOMPRESSION, ROTATOR CUFF REPAIR AND BICEP TENDON REPAIR Right 03/10/2014   Procedure: RIGHT SHOULDER ARTHROSCOPY WITH SUBACROMIAL DECOMPRESSION, PARTIAL ACROMIOPLASTY WITH CORACOAROMIAL LABRUM DEBRIDEMENT RELEASE DISTAL CLAVICULECTOMY,  ROTATOR CUFF REPAIR AND EXTENSIVE DEBRIDEMENT;  Surgeon: Loreta Ave, MD;  Location: [facility];  Service: Orthopedics;  Laterality: Right;   SPINE SURGERY  Disc repaired 12/1976   TENDON REPAIR  06/06/2011   right elbow, Dr. Eulah Pont    Family History  Problem Relation Age of Onset   Colon polyps Mother    Breast cancer Mother    Arthritis Mother    Cancer Mother    Hearing loss Mother    Miscarriages / [place] Mother    Varicose Veins Mother    Heart disease Father 13   Alcohol abuse Father    Asthma Father    Hypertension Father    Cancer Brother    Alcohol abuse Other    Arthritis Other    Cancer Other        Breast, Prostate   Coronary artery disease Other    Irritable bowel syndrome Other    Cystic fibrosis Other    Anxiety disorder Son    Colon cancer Neg Hx    Esophageal cancer Neg Hx    Stomach cancer Neg Hx    Rectal cancer Neg Hx     Social:  reports that he has never smoked. He has never used smokeless tobacco. He reports current alcohol use of about 1.0 standard drink of  alcohol per week. He reports that he does not use drugs.  Allergies:  Allergies  Allergen Reactions   Oxycodone Itching   Atorvastatin Other (See Comments)    Muscle pain   Mobic [Meloxicam] Other (See Comments)    Severe diarrhea    Simvastatin Other (See Comments)    Muscle pain    Medications: I have reviewed the patient's current medications.  No results found for this or any previous visit (from the past 48 hour(s)).  No results found.   PE Blood pressure (!) 164/94, pulse 69, temperature (!) 97.3 F (36.3 C), temperature source Oral, resp. rate 18, height 5\' 8"  (1.727 m), weight 93.3 kg, SpO2 97%. Constitutional: NAD; conversant Eyes: Moist conjunctiva; no lid lag; anicteric Lungs: Normal respiratory effort CV: RRR GI: Abd soft, NT/ND Psychiatric: Appropriate affect  No results found for this or any previous visit (from the past 48 hour(s)).  No results found.  A/P: Zachary [name] is an 72 y.o. male with hx of hypothyroidism, GERD, HLD, allergies here for evaluation of recurrent sigmoid diverticulitis -  without perforation or abscess  -The anatomy and physiology of the GI tract was reviewed with the patient. The pathophysiology of recurrent diverticulitis was discussed as well with associated pictures.  -With regards to recurrent diverticulitis, we spent time discussing general recommendations with regards to rationale for or against surgery. We discussed that surgery in this particular case is more of a quality of life decision based on severity of recurrences and frequency of attacks. We discussed that there is no magic number of attacks before surgery is clearly recommended but should base this on quality of life disturbances. We spent time giving him an overview of what surgery would offer including robotic assisted low anterior resection/sigmoidectomy, flexible sigmoidoscopy, ICG perfusion assessment.  -The planned procedure, material risks (including, but not  limited to, pain, bleeding, infection, scarring, need for blood transfusion, damage to surrounding structures- blood vessels/nerves/viscus/organs, damage to ureter, urine leak, leak from anastomosis, need for additional procedures, scenarios where a stoma may be necessary and where it may be permanent, worsening of pre-existing medical conditions, chronic diarrhea, constipation secondary to narcotic use, hernia, recurrence, pneumonia, heart attack, stroke, death) benefits and alternatives to surgery were discussed at length. The patient's questions were answered to his satisfaction, he voiced understanding and has elected to proceed with surgery. Additionally, we discussed typical postoperative expectations and the recovery process.    Marin Olp, MD [facility]

## 2023-09-11 NOTE — Progress Notes (Signed)
   09/11/23 2102  BiPAP/CPAP/SIPAP  $ Non-Invasive Home Ventilator  Initial  BiPAP/CPAP/SIPAP Pt Type Adult  BiPAP/CPAP/SIPAP DREAMSTATIOND  Mask Type Full face mask (from home)  FiO2 (%) 21 %  Patient Home Equipment No (mask and tubing from home)  Auto Titrate Yes (6-20)  BiPAP/CPAP /SiPAP Vitals  Pulse Rate 87  Resp 18  SpO2 96 %  Bilateral Breath Sounds Clear  MEWS Score/Color  MEWS Score 0  MEWS Score Color Chilton Si

## 2023-09-11 NOTE — Transfer of Care (Signed)
Immediate Anesthesia Transfer of Care Note  Patient: Zachary [name]  Procedure(s) Performed: XI ROBOTIC ASSISTED LOW ANTERIOR RESECTION/ SIGMOIDECTOMY AND INTRAOPERATIVE ASSESSMENT OF PERFUSION USING ICG DYE FLEXIBLE SIGMOIDOSCOPY  Patient Location: PACU  Anesthesia Type:General  Level of Consciousness: sedated and drowsy  Airway & Oxygen Therapy: Patient Spontanous Breathing and Patient connected to face mask  Post-op Assessment: Report given to RN and Post -op Vital signs reviewed and stable  Post vital signs: Reviewed and stable  Last Vitals:  Vitals Value Taken Time  BP 132/83 09/11/23 1422  Temp    Pulse 68 09/11/23 1424  Resp 14 09/11/23 1424  SpO2 98 % 09/11/23 1424  Vitals shown include unfiled device data.  Last Pain:  Vitals:   09/11/23 0928  TempSrc:   PainSc: 0-No pain         Complications: No notable events documented.

## 2023-09-12 ENCOUNTER — Encounter (HOSPITAL_COMMUNITY): Payer: Self-pay | Admitting: Surgery

## 2023-09-12 LAB — CBC
HCT: 44.4 % (ref 39.0–52.0)
Hemoglobin: 15 g/dL (ref 13.0–17.0)
MCH: 31.9 pg (ref 26.0–34.0)
MCHC: 33.8 g/dL (ref 30.0–36.0)
MCV: 94.5 fL (ref 80.0–100.0)
Platelets: 233 10*3/uL (ref 150–400)
RBC: 4.7 MIL/uL (ref 4.22–5.81)
RDW: 12.4 % (ref 11.5–15.5)
WBC: 13.2 10*3/uL — ABNORMAL HIGH (ref 4.0–10.5)
nRBC: 0 % (ref 0.0–0.2)

## 2023-09-12 LAB — BASIC METABOLIC PANEL
Anion gap: 10 (ref 5–15)
BUN: 11 mg/dL (ref 8–23)
CO2: 22 mmol/L (ref 22–32)
Calcium: 8.9 mg/dL (ref 8.9–10.3)
Chloride: 101 mmol/L (ref 98–111)
Creatinine, Ser: 0.8 mg/dL (ref 0.61–1.24)
GFR, Estimated: >60 mL/min (ref >60)
Glucose, Bld: 126 mg/dL — ABNORMAL HIGH (ref 70–99)
Potassium: 3.7 mmol/L (ref 3.5–5.1)
Sodium: 133 mmol/L — ABNORMAL LOW (ref 135–145)

## 2023-09-12 NOTE — Progress Notes (Signed)
   09/12/23 1955  BiPAP/CPAP/SIPAP  BiPAP/CPAP/SIPAP Pt Type Adult  BiPAP/CPAP/SIPAP DREAMSTATIOND  Mask Type Full face mask  FiO2 (%) 21 %  Patient Home Equipment No  Auto Titrate Yes (6-20)  CPAP/SIPAP surface wiped down Yes  BiPAP/CPAP /SiPAP Vitals  Pulse Rate 64  Resp 17  SpO2 97 %  Bilateral Breath Sounds Clear  MEWS Score/Color  MEWS Score 0  MEWS Score Color Chilton Si

## 2023-09-12 NOTE — Progress Notes (Signed)
   09/12/23 1124  TOC Brief Assessment  Insurance and Status Reviewed  Patient has primary care physician Yes  Home environment has been reviewed Resides with spouse  Prior level of function: Independent at baseline  Prior/Current Home Services No current home services  Social Determinants of Health Reivew SDOH reviewed no interventions necessary  Readmission risk has been reviewed Yes  Transition of care needs no transition of care needs at this time

## 2023-09-12 NOTE — Discharge Instructions (Signed)
POST OP INSTRUCTIONS AFTER COLON SURGERY  DIET: Be sure to include lots of fluids daily to stay hydrated - 64oz of water per day (8, 8 oz glasses).  Avoid fast food or heavy meals for the first couple of weeks as your are more likely to get nauseated. Avoid raw/uncooked fruits or vegetables for the first 4 weeks (its ok to have these if they are blended into smoothie form). If you have fruits/vegetables, make sure they are cooked until soft enough to mash on the roof of your mouth and chew your food well. Otherwise, diet as tolerated.  Take your usually prescribed home medications unless otherwise directed.  PAIN CONTROL: Pain is best controlled by a usual combination of three different methods TOGETHER: Ice/Heat Over the counter pain medication Prescription pain medication Most patients will experience some swelling and bruising around the surgical site.  Ice packs or heating pads (30-60 minutes up to 6 times a day) will help. Some people prefer to use ice alone, heat alone, alternating between ice & heat.  Experiment to what works for you.  Swelling and bruising can take several weeks to resolve.   It is helpful to take an over-the-counter pain medication regularly for the first few weeks: Ibuprofen (Motrin/Advil) - 200mg  tabs - take 3 tabs (600mg ) every 6 hours as needed for pain (unless you have been directed previously to avoid NSAIDs/ibuprofen) Acetaminophen (Tylenol) - you may take 650mg  every 6 hours as needed. You can take this with motrin as they act differently on the body. If you are taking a narcotic pain medication that has acetaminophen in it, do not take over the counter tylenol at the same time. NOTE: You may take both of these medications together - most patients  find it most helpful when alternating between the two (i.e. Ibuprofen at 6am, tylenol at 9am, ibuprofen at 12pm ..Marland Kitchen) A  prescription for pain medication should be given to you upon discharge.  Take your pain medication as  prescribed if your pain is not adequatly controlled with the over-the-counter pain reliefs mentioned above.  Avoid getting constipated.  Between the surgery and the pain medications, it is common to experience some constipation.  Increasing fluid intake and taking a fiber supplement (such as Metamucil, Citrucel, FiberCon, MiraLax, etc) 1-2 times a day regularly will usually help prevent this problem from occurring.  A mild laxative (prune juice, Milk of Magnesia, MiraLax, etc) should be taken according to package directions if there are no bowel movements after 48 hours.    Dressing: Your incisions are covered in Dermabond which is like sterile superglue for the skin. This will come off on it's own in a couple weeks. It is waterproof and you may bathe normally starting the day after your surgery in a shower. Avoid baths/pools/lakes/oceans until your wounds have fully healed.  ACTIVITIES as tolerated:   Avoid heavy lifting (>10lbs or 1 gallon of milk) for the next 6 weeks. You may resume regular daily activities as tolerated--such as daily self-care, walking, climbing stairs--gradually increasing activities as tolerated.  If you can walk 30 minutes without difficulty, it is safe to try more intense activity such as jogging, treadmill, bicycling, low-impact aerobics.  DO NOT PUSH THROUGH PAIN.  Let pain be your guide: If it hurts to do something, don't do it. You may drive when you are no longer taking prescription pain medication, you can comfortably wear a seatbelt, and you can safely maneuver your car and apply brakes.  FOLLOW UP in our  office Please call [facility] at [phone] to set up an appointment to see your surgeon in the office for a follow-up appointment approximately 2 weeks after your surgery. Make sure that you call for this appointment the day you arrive home to insure a convenient appointment time.  9. If you have disability or family leave forms that need to be completed, you may have  them completed by your primary care physician's office; for return to work instructions, please ask our office staff and they will be happy to assist you in obtaining this documentation   When to call us [phone]: Poor pain control Reactions / problems with new medications (rash/itching, etc)  Fever over 101.5 F (38.5 C) Inability to urinate Nausea/vomiting Worsening swelling or bruising Continued bleeding from incision. Increased pain, redness, or drainage from the incision  The clinic staff is available to answer your questions during regular business hours (8:30am-5pm).  Please don't hesitate to call and ask to speak to one of our nurses for clinical concerns.   A surgeon from [facility] is always on call at the hospitals   If you have a medical emergency, go to the nearest emergency room or call 911.  [facility] [address], [place], [place]  [address] MAIN: [phone] FAX: [phone] [email or web address]

## 2023-09-12 NOTE — Progress Notes (Signed)
Subjective No acute events. Feeling well. No complaints. Denies n/v. Pain well controlled. Mobilizing well on his own. Having flatus. Had 3 BM today - bloody this morning but now nonbloody this afternoon  Objective: Vital signs in last 24 hours: Temp:  [97.5 F (36.4 C)-98.7 F (37.1 C)] 98.6 F (37 C) (09/20 1436) Pulse Rate:  [57-87] 57 (09/20 1436) Resp:  [11-18] 18 (09/20 1436) BP: (124-155)/(80-95) 129/84 (09/20 1436) SpO2:  [92 %-97 %] 96 % (09/20 1436) FiO2 (%):  [21 %] 21 % (09/19 2102) Weight:  [96.8 kg] 96.8 kg (09/20 0500) Last BM Date : 09/12/23  Intake/Output from previous day: 09/19 0701 - 09/20 0700 In: 2159.9 [P.O.:360; I.V.:1699.9; IV Piggyback:100] Out: 1230 [Urine:1200; Blood:30] Intake/Output this shift: No intake/output data recorded.  Gen: NAD, comfortable CV: RRR Pulm: Normal work of breathing Abd: Soft, NT/ND. Incisions c/d/I without erythema/drainage Ext: SCDs in place  Lab Results: CBC  Recent Labs    09/12/23 0443  WBC 13.2*  HGB 15.0  HCT 44.4  PLT 233   BMET Recent Labs    09/12/23 0443  NA 133*  K 3.7  CL 101  CO2 22  GLUCOSE 126*  BUN 11  CREATININE 0.80  CALCIUM 8.9   PT/INR No results for input(s): "LABPROT", "INR" in the last 72 hours. ABG No results for input(s): "PHART", "HCO3" in the last 72 hours.  Invalid input(s): "PCO2", "PO2"  Studies/Results:  Anti-infectives: Anti-infectives (From admission, onward)    Start     Dose/Rate Route Frequency Ordered Stop   09/11/23 1400  neomycin (MYCIFRADIN) tablet 1,000 mg  Status:  Discontinued       Placed in "And" Linked Group   1,000 mg Oral 3 times per day 09/11/23 0900 09/11/23 0905   09/11/23 1400  metroNIDAZOLE (FLAGYL) tablet 1,000 mg  Status:  Discontinued       Placed in "And" Linked Group   1,000 mg Oral 3 times per day 09/11/23 0900 09/11/23 0905   09/11/23 0915  cefoTEtan (CEFOTAN) 2 g in sodium chloride 0.9 % 100 mL IVPB        2 g 200 mL/hr over 30  Minutes Intravenous On call to O.R. 09/11/23 0900 09/11/23 1135        Assessment/Plan: Patient Active Problem List   Diagnosis Date Noted   S/P laparoscopic-assisted sigmoidectomy 09/11/2023   Cough 05/24/2023   Acute diverticulitis 05/23/2023   Hearing aid worn 02/28/2021   Coronary artery disease involving autologous artery coronary bypass graft with angina pectoris (HCC) 01/22/2021   Chest pain 01/15/2021   Ingrowing nail, left great toe 07/12/2020   Radiation cystitis 04/09/2019   Bladder tumor 03/31/2019   Contracture of left Achilles tendon 10/26/2018   Plantar fasciitis of left foot 08/25/2018   Prediabetes 05/27/2018   Stomatitis 04/17/2017   Dyslipidemia (high LDL; low HDL) 08/15/2016   S/P arthroscopy of shoulder 05/31/2016   History of prostate cancer 05/09/2016   Complete rotator cuff tear of left shoulder 07/05/2015   Tennis elbow 05/30/2014   Rotator cuff (capsule) sprain 03/10/2014   Right shoulder pain 02/11/2014   Memory difficulty 12/18/2012   Gynecomastia, male 07/30/2012   OSA (obstructive sleep apnea) 04/23/2011   EXTERNAL HEMORRHOIDS 02/12/2011   Prostate cancer (HCC) 11/12/2010   PLANTAR FASCIITIS 04/09/2010   Hypothyroidism 12/08/2009   NEOPLASM OF UNCERTAIN BEHAVIOR OF SKIN 03/10/2009   OSTEOARTHRITIS 02/14/2009   Mixed hyperlipidemia 02/08/2009   HEARING DEFICIT 02/08/2009   Essential hypertension 02/08/2009   Allergic  rhinitis 02/08/2009   GERD without esophagitis 02/08/2009   PROTEINURIA 02/08/2009   DIVERTICULITIS, HX OF 02/08/2009   s/p Procedure(s): XI ROBOTIC ASSISTED LOW ANTERIOR RESECTION/ SIGMOIDECTOMY AND INTRAOPERATIVE ASSESSMENT OF PERFUSION USING ICG DYE FLEXIBLE SIGMOIDOSCOPY 09/11/2023  -Doing great -Ambulate 5x/day -Diet as tolerated -D/C Entereg -PPX: SQH, SCD -Dispo: Possible discharge 9/21 if continues to do well  We spent time reviewing his procedure, findings, expectations and plans. All questions were answered.  He and his wife expressed understanding and agreement with plan.    LOS: 1 day   Marin Olp, MD [facility]

## 2023-09-12 NOTE — Progress Notes (Signed)
Mobility Specialist - Progress Note   09/12/23 1007  Mobility  Activity Ambulated with assistance in hallway  Level of Assistance Standby assist, set-up cues, supervision of patient - no hands on  Assistive Device Front wheel walker  Distance Ambulated (ft) 280 ft  Activity Response Tolerated well  Mobility Referral Yes  $Mobility charge 1 Mobility  Mobility Specialist Start Time (ACUTE ONLY) 0954  Mobility Specialist Stop Time (ACUTE ONLY) 1007  Mobility Specialist Time Calculation (min) (ACUTE ONLY) 13 min   Pt received in bed and agreeable to mobility. After ambulating ~173ft pt c/o feeling dizzy. Pt still eager to ambulate. No other complaints during session. Pt to bed after session with all needs met.    [facility]

## 2023-09-13 ENCOUNTER — Other Ambulatory Visit: Payer: Self-pay | Admitting: Family Medicine

## 2023-09-13 LAB — BASIC METABOLIC PANEL
Anion gap: 8 (ref 5–15)
BUN: 16 mg/dL (ref 8–23)
CO2: 26 mmol/L (ref 22–32)
Calcium: 8.8 mg/dL — ABNORMAL LOW (ref 8.9–10.3)
Chloride: 103 mmol/L (ref 98–111)
Creatinine, Ser: 0.77 mg/dL (ref 0.61–1.24)
GFR, Estimated: >60 mL/min (ref >60)
Glucose, Bld: 109 mg/dL — ABNORMAL HIGH (ref 70–99)
Potassium: 3.5 mmol/L (ref 3.5–5.1)
Sodium: 137 mmol/L (ref 135–145)

## 2023-09-13 LAB — CBC
HCT: 42.3 % (ref 39.0–52.0)
Hemoglobin: 14.2 g/dL (ref 13.0–17.0)
MCH: 32.3 pg (ref 26.0–34.0)
MCHC: 33.6 g/dL (ref 30.0–36.0)
MCV: 96.1 fL (ref 80.0–100.0)
Platelets: 203 10*3/uL (ref 150–400)
RBC: 4.4 MIL/uL (ref 4.22–5.81)
RDW: 12.7 % (ref 11.5–15.5)
WBC: 11.8 10*3/uL — ABNORMAL HIGH (ref 4.0–10.5)
nRBC: 0 % (ref 0.0–0.2)

## 2023-09-13 MED ORDER — TRAMADOL HCL 50 MG PO TABS: 50.0000 mg | ORAL_TABLET | Freq: Four times a day (QID) | ORAL | 0 refills | Status: DC | PRN

## 2023-09-13 NOTE — Progress Notes (Signed)
PHARMACIST - PHYSICIAN COMMUNICATION  CONCERNING: Alvimopan   RECOMMENDATION: This patient is receiving alvimopan post-operatively.  Based on criteria approved by the Pharmacy and Therapeutics Committee, the medication will be discontinued.  DESCRIPTION: These criteria include: Patient will receive NO MORE than 15 doses total during current hospitalization If bowel recovery (documented return of bowel sounds and a bowel movement confirmed by RN or patient) occurs before completion of 7 days of therapy, a pharmacist may discontinue alvimopan  If you have questions about this conversion, please contact the Pharmacy Department    Pricilla Riffle, PharmD, BCPS Clinical Pharmacist 09/13/2023 9:11 AM

## 2023-09-13 NOTE — Discharge Summary (Signed)
Physician Discharge Summary  Patient ID: Zachary [name] MRN: [id number] DOB/AGE: 03/12/1951 72 y.o.  Admit date: 09/11/2023 Discharge date: 09/13/2023  Admission Diagnoses: History of diverticulitis  Discharge Diagnoses: Same Principal Problem:   S/P laparoscopic-assisted sigmoidectomy   Discharged Condition: good  Hospital Course: The patient underwent robotic assisted low anterior resection for history of diverticulitis.  He is doing well.  He is ambulating and his bowels are moving.  His pain is controlled and he is tolerating his diet.  He was discharged home on postop day 2.     Treatments: surgery: Robotic sigmoid colectomy  Discharge Exam: Blood pressure (!) 156/86, pulse (!) 52, temperature 97.8 F (36.6 C), temperature source Oral, resp. rate 17, height 5\' 8"  (1.727 m), weight 96.8 kg, SpO2 95%. General appearance: alert Resp: clear to auscultation bilaterally Cardio: Normal sinus rhythm Incision/Wound: Incisions clean dry intact.  Mild abdominal distention.  No rebound or guarding.  Appropriately sore  Disposition: Discharge disposition: 01-Home or Self Care       Discharge Instructions     Diet - low sodium heart healthy   Complete by: As directed    Increase activity slowly   Complete by: As directed       Allergies as of 09/13/2023       Reactions   Oxycodone Itching   Atorvastatin Other (See Comments)   Muscle pain   Mobic [meloxicam] Other (See Comments)   Severe diarrhea    Simvastatin Other (See Comments)   Muscle pain        Medication List     TAKE these medications    acetaminophen 500 MG tablet Commonly known as: TYLENOL Take 1,000 mg by mouth every 6 (six) hours as needed for moderate pain.   aspirin EC 81 MG tablet Take 81 mg by mouth daily.   dicyclomine 10 MG capsule Commonly known as: BENTYL TAKE 1 CAPSULE BY MOUTH EVERY 6 HOURS AS NEEDED FOR  ABDOMINAL  CRAMPING   fluticasone 50 MCG/ACT nasal spray Commonly  known as: FLONASE Place 2 sprays into both nostrils daily.   levothyroxine 100 MCG tablet Commonly known as: SYNTHROID Take 1 tablet by mouth once daily What changed: when to take this   loratadine 10 MG tablet Commonly known as: CLARITIN Take 10 mg by mouth daily.   melatonin 5 MG Tabs Take 5 mg by mouth at bedtime as needed (sleep).   omeprazole 20 MG capsule Commonly known as: PRILOSEC Take 1 capsule (20 mg total) by mouth daily.   pravastatin 80 MG tablet Commonly known as: PRAVACHOL Take 1 tablet by mouth once daily What changed: when to take this   PREPARATION H EX Apply 1 Application topically daily as needed (hemorrhoids).   saccharomyces boulardii 250 MG capsule Commonly known as: FLORASTOR Take 250 mg by mouth 2 (two) times daily.   sodium bicarbonate 650 MG tablet Take 1 tablet by mouth once daily   traMADol 50 MG tablet Commonly known as: Ultram Take 1 tablet (50 mg total) by mouth every 6 (six) hours as needed.        Follow-up Information     Andria Meuse, MD Follow up on 10/01/2023.   Specialties: General Surgery, Colon and Rectal Surgery Why: Please arrive by 9:15 am Contact information: [address] [place] [address] [phone]                 Signed: Clovis Pu Revel Stellmach 09/13/2023, 11:44 AM

## 2023-09-13 NOTE — Progress Notes (Signed)
 Patient discharged home, IV removed, discharge paperwork provided and explained, patient verbalized understanding.

## 2023-09-15 LAB — SURGICAL PATHOLOGY

## 2023-09-25 ENCOUNTER — Emergency Department (HOSPITAL_BASED_OUTPATIENT_CLINIC_OR_DEPARTMENT_OTHER): Payer: Medicare Other

## 2023-09-25 ENCOUNTER — Other Ambulatory Visit: Payer: Self-pay

## 2023-09-25 ENCOUNTER — Emergency Department (HOSPITAL_BASED_OUTPATIENT_CLINIC_OR_DEPARTMENT_OTHER)
Admission: EM | Admit: 2023-09-25 | Discharge: 2023-09-25 | Disposition: A | Discharge status: Home / Self Care | Payer: Medicare Other | Attending: Emergency Medicine | Admitting: Emergency Medicine

## 2023-09-25 ENCOUNTER — Encounter (HOSPITAL_BASED_OUTPATIENT_CLINIC_OR_DEPARTMENT_OTHER): Payer: Self-pay

## 2023-09-25 DIAGNOSIS — I10 Essential (primary) hypertension: Secondary | ICD-10-CM | POA: Diagnosis not present

## 2023-09-25 DIAGNOSIS — I2510 Atherosclerotic heart disease of native coronary artery without angina pectoris: Secondary | ICD-10-CM | POA: Diagnosis not present

## 2023-09-25 DIAGNOSIS — R1031 Right lower quadrant pain: Secondary | ICD-10-CM | POA: Diagnosis not present

## 2023-09-25 DIAGNOSIS — Z8546 Personal history of malignant neoplasm of prostate: Secondary | ICD-10-CM | POA: Insufficient documentation

## 2023-09-25 DIAGNOSIS — S3993XA Unspecified injury of pelvis, initial encounter: Secondary | ICD-10-CM | POA: Diagnosis not present

## 2023-09-25 DIAGNOSIS — N281 Cyst of kidney, acquired: Secondary | ICD-10-CM | POA: Diagnosis not present

## 2023-09-25 DIAGNOSIS — Z7982 Long term (current) use of aspirin: Secondary | ICD-10-CM | POA: Insufficient documentation

## 2023-09-25 DIAGNOSIS — S3991XA Unspecified injury of abdomen, initial encounter: Secondary | ICD-10-CM | POA: Diagnosis not present

## 2023-09-25 LAB — CBC WITH DIFFERENTIAL/PLATELET
Abs Immature Granulocytes: 0.02 10*3/uL (ref 0.00–0.07)
Basophils Absolute: 0 10*3/uL (ref 0.0–0.1)
Basophils Relative: 1 %
Eosinophils Absolute: 0.5 10*3/uL (ref 0.0–0.5)
Eosinophils Relative: 6 %
HCT: 43.7 % (ref 39.0–52.0)
Hemoglobin: 14.9 g/dL (ref 13.0–17.0)
Immature Granulocytes: 0 %
Lymphocytes Relative: 36 %
Lymphs Abs: 3 10*3/uL (ref 0.7–4.0)
MCH: 31.3 pg (ref 26.0–34.0)
MCHC: 34.1 g/dL (ref 30.0–36.0)
MCV: 91.8 fL (ref 80.0–100.0)
Monocytes Absolute: 0.7 10*3/uL (ref 0.1–1.0)
Monocytes Relative: 9 %
Neutro Abs: 4.1 10*3/uL (ref 1.7–7.7)
Neutrophils Relative %: 48 %
Platelets: 280 10*3/uL (ref 150–400)
RBC: 4.76 MIL/uL (ref 4.22–5.81)
RDW: 12.4 % (ref 11.5–15.5)
WBC: 8.3 10*3/uL (ref 4.0–10.5)
nRBC: 0 % (ref 0.0–0.2)

## 2023-09-25 LAB — COMPREHENSIVE METABOLIC PANEL
ALT: 29 U/L (ref 0–44)
AST: 21 U/L (ref 15–41)
Albumin: 4.2 g/dL (ref 3.5–5.0)
Alkaline Phosphatase: 54 U/L (ref 38–126)
Anion gap: 12 (ref 5–15)
BUN: 24 mg/dL — ABNORMAL HIGH (ref 8–23)
CO2: 27 mmol/L (ref 22–32)
Calcium: 9.9 mg/dL (ref 8.9–10.3)
Chloride: 100 mmol/L (ref 98–111)
Creatinine, Ser: 0.9 mg/dL (ref 0.61–1.24)
GFR, Estimated: >60 mL/min (ref >60)
Glucose, Bld: 106 mg/dL — ABNORMAL HIGH (ref 70–99)
Potassium: 3.9 mmol/L (ref 3.5–5.1)
Sodium: 139 mmol/L (ref 135–145)
Total Bilirubin: 0.6 mg/dL (ref 0.3–1.2)
Total Protein: 7.3 g/dL (ref 6.5–8.1)

## 2023-09-25 LAB — URINALYSIS, ROUTINE W REFLEX MICROSCOPIC
Bilirubin Urine: NEGATIVE
Glucose, UA: NEGATIVE mg/dL
Hgb urine dipstick: NEGATIVE
Ketones, ur: NEGATIVE mg/dL
Leukocytes,Ua: NEGATIVE
Nitrite: NEGATIVE
Protein, ur: NEGATIVE mg/dL
Specific Gravity, Urine: 1.015 (ref 1.005–1.030)
pH: 7 (ref 5.0–8.0)

## 2023-09-25 MED ORDER — KETOROLAC TROMETHAMINE 30 MG/ML IJ SOLN
15.0000 mg | Freq: Once | INTRAMUSCULAR | Status: AC
  Administered 2023-09-25: 15 mg via INTRAVENOUS
  Filled 2023-09-25: qty 1

## 2023-09-25 MED ORDER — LIDOCAINE 5 % EX PTCH: 1.0000 | MEDICATED_PATCH | CUTANEOUS | 0 refills | Status: DC

## 2023-09-25 MED ORDER — IBUPROFEN 800 MG PO TABS: 800.0000 mg | ORAL_TABLET | Freq: Three times a day (TID) | ORAL | 0 refills | Status: DC

## 2023-09-25 MED ORDER — FENTANYL CITRATE PF 50 MCG/ML IJ SOSY
100.0000 ug | PREFILLED_SYRINGE | Freq: Once | INTRAMUSCULAR | Status: AC
  Administered 2023-09-25: 100 ug via INTRAVENOUS
  Filled 2023-09-25: qty 2

## 2023-09-25 MED ORDER — SODIUM CHLORIDE 0.9 % IV SOLN: INTRAVENOUS | Status: DC

## 2023-09-25 MED ORDER — ONDANSETRON HCL 4 MG/2ML IJ SOLN
4.0000 mg | Freq: Once | INTRAMUSCULAR | Status: AC
  Administered 2023-09-25: 4 mg via INTRAVENOUS
  Filled 2023-09-25: qty 2

## 2023-09-25 MED ORDER — IOHEXOL 300 MG/ML  SOLN
100.0000 mL | Freq: Once | INTRAMUSCULAR | Status: AC | PRN
  Administered 2023-09-25: 100 mL via INTRAVENOUS

## 2023-09-25 NOTE — ED Triage Notes (Signed)
Pt with RLQ pain that began last night at around 9:30pm. Pt had a BM last night and passed gas, but none since then. Pt recent surgery for colon resection (15 cm removed). No fever or urinary sx.

## 2023-09-25 NOTE — ED Provider Notes (Signed)
[place] [facility] Provider Note   CSN: [id number] Arrival date & time: 09/25/23  0443     History  Chief Complaint  Patient presents with   Abdominal Pain    Zachary [name] is a 72 y.o. male.  The history is provided by the patient.  Abdominal Pain Pain location:  RLQ Pain radiates to:  Does not radiate Pain severity:  Severe Onset quality:  Sudden Duration:  8 hours Timing:  Constant Progression:  Worsening (post cough) Chronicity:  New Context: not alcohol use   Relieved by:  Nothing Worsened by:  Nothing Ineffective treatments:  None tried Associated symptoms: no chest pain, no constipation, no fever, no nausea, no shortness of breath, no sore throat, no vaginal bleeding, no vaginal discharge and no vomiting   Risk factors: no alcohol abuse   Patient with recent sigmoidectomy by Dr. Cliffton Asters on 9/19 presents with RLQ pain.  Was working outside all day and had some pain since 930 pm, markedly worse post cough.  No vomiting or diarrhea.  No fevers.  No urinary symptoms.      Past Medical History:  Diagnosis Date   Allergy Seasonal/dust   take Loratadine 10 mg daily   Arthritis    Articular cartilage disease    left shoulder   CAD (coronary artery disease)    Diverticulitis    Dyslipidemia (high LDL; low HDL) 08/15/2016   GERD (gastroesophageal reflux disease)    Hearing problem    hearing deficit   Hemorrhoids    History of back surgery    Hypertension    off meds since 05/25/23 -   Hypothyroidism    Prostate cancer (HCC) 2015   treated and in remission   Sclerosing mesenteritis (HCC)    diagnosed 2022   Sleep apnea    uses a cpap   Stroke (HCC)    TIA -affected balance and went through PT.   Wears glasses    Wears hearing aid    both ears     Home Medications Prior to Admission medications   Medication Sig Start Date End Date Taking? Authorizing Provider  ibuprofen (ADVIL) 800 MG tablet Take 1 tablet (800  mg total) by mouth 3 (three) times daily. 09/25/23  Yes Khaliyah Northrop, MD  lidocaine (LIDODERM) 5 % Place 1 patch onto the skin daily. Remove & Discard patch within 12 hours or as directed by MD 09/25/23  Yes Arabela Basaldua, MD  lidocaine (LIDODERM) 5 % Place 1 patch onto the skin daily. Remove & Discard patch within 12 hours or as directed by MD 09/25/23  Yes Liliana Brentlinger, MD  acetaminophen (TYLENOL) 500 MG tablet Take 1,000 mg by mouth every 6 (six) hours as needed for moderate pain.    [provider]  aspirin EC 81 MG EC tablet Take 81 mg by mouth daily.    [provider]  dicyclomine (BENTYL) 10 MG capsule TAKE 1 CAPSULE BY MOUTH EVERY 6 HOURS AS NEEDED FOR  ABDOMINAL  CRAMPING 09/01/23   Carmelia Roller, Jilda Roche, DO  fluticasone [facility]) 50 MCG/ACT nasal spray Place 2 sprays into both nostrils daily. 01/27/23   Sharlene Dory, DO  Hydrocortisone (PREPARATION H EX) Apply 1 Application topically daily as needed (hemorrhoids).    [provider]  levothyroxine (SYNTHROID) 100 MCG tablet Take 1 tablet by mouth once daily Patient taking differently: Take 100 mcg by mouth every evening. 06/30/23   Sharlene Dory, DO  loratadine (CLARITIN)  10 MG tablet Take 10 mg by mouth daily. 04/25/16   [provider]  melatonin 5 MG TABS Take 5 mg by mouth at bedtime as needed (sleep).    [provider]  omeprazole (PRILOSEC) 20 MG capsule Take 1 capsule (20 mg total) by mouth daily. 07/10/23   Sharlene Dory, DO  pravastatin (PRAVACHOL) 80 MG tablet Take 1 tablet by mouth once daily 09/15/23   Carmelia Roller, Jilda Roche, DO  saccharomyces boulardii (FLORASTOR) 250 MG capsule Take 250 mg by mouth 2 (two) times daily.    [provider]  sodium bicarbonate 650 MG tablet Take 1 tablet by mouth once daily 07/10/23   Carmelia Roller, Jilda Roche, DO  traMADol (ULTRAM) 50 MG tablet Take 1 tablet (50 mg total) by mouth every 6 (six) hours as needed.  09/13/23 09/12/24  Harriette Bouillon, MD      Allergies    Oxycodone, Atorvastatin, Mobic [meloxicam], and Simvastatin    Review of Systems   Review of Systems  Constitutional:  Negative for fever.  HENT:  Negative for sore throat.   Respiratory:  Negative for shortness of breath.   Cardiovascular:  Negative for chest pain.  Gastrointestinal:  Positive for abdominal pain. Negative for constipation, nausea and vomiting.  Genitourinary:  Negative for vaginal bleeding and vaginal discharge.  All other systems reviewed and are negative.   Physical Exam Updated Vital Signs BP (!) 140/92   Pulse 62   Temp 97.7 F (36.5 C) (Oral)   Resp 17   Ht 5\' 8"  (1.727 m)   Wt 96.8 kg   SpO2 95%   BMI 32.45 kg/m  Physical Exam Vitals and nursing note reviewed.  Constitutional:      General: He is not in acute distress.    Appearance: Normal appearance. He is well-developed. He is not diaphoretic.  HENT:     Head: Normocephalic and atraumatic.     Nose: Nose normal.  Eyes:     Conjunctiva/sclera: Conjunctivae normal.     Pupils: Pupils are equal, round, and reactive to light.  Cardiovascular:     Rate and Rhythm: Normal rate and regular rhythm.     Pulses: Normal pulses.     Heart sounds: Normal heart sounds.  Pulmonary:     Effort: Pulmonary effort is normal.     Breath sounds: Normal breath sounds. No wheezing or rales.  Abdominal:     General: Bowel sounds are normal.     Palpations: Abdomen is soft.     Tenderness: There is no abdominal tenderness. There is no guarding or rebound.  Musculoskeletal:        General: Normal range of motion.     Cervical back: Normal range of motion and neck supple.  Skin:    General: Skin is warm and dry.     Capillary Refill: Capillary refill takes less than 2 seconds.  Neurological:     General: No focal deficit present.     Mental Status: He is alert and oriented to person, place, and time.     Deep Tendon Reflexes: Reflexes normal.   Psychiatric:        Mood and Affect: Mood normal.        Behavior: Behavior normal.     ED Results / Procedures / Treatments   Labs (all labs ordered are listed, but only abnormal results are displayed) Results for orders placed or performed during the hospital encounter of 09/25/23  CBC with Differential  Result Value Ref  Range   WBC 8.3 4.0 - 10.5 K/uL   RBC 4.76 4.22 - 5.81 MIL/uL   Hemoglobin 14.9 13.0 - 17.0 g/dL   HCT 16.1 09.6 - 04.5 %   MCV 91.8 80.0 - 100.0 fL   MCH 31.3 26.0 - 34.0 pg   MCHC 34.1 30.0 - 36.0 g/dL   RDW 40.9 81.1 - 91.4 %   Platelets 280 150 - 400 K/uL   nRBC 0.0 0.0 - 0.2 %   Neutrophils Relative % 48 %   Neutro Abs 4.1 1.7 - 7.7 K/uL   Lymphocytes Relative 36 %   Lymphs Abs 3.0 0.7 - 4.0 K/uL   Monocytes Relative 9 %   Monocytes Absolute 0.7 0.1 - 1.0 K/uL   Eosinophils Relative 6 %   Eosinophils Absolute 0.5 0.0 - 0.5 K/uL   Basophils Relative 1 %   Basophils Absolute 0.0 0.0 - 0.1 K/uL   Immature Granulocytes 0 %   Abs Immature Granulocytes 0.02 0.00 - 0.07 K/uL  Comprehensive metabolic panel  Result Value Ref Range   Sodium 139 135 - 145 mmol/L   Potassium 3.9 3.5 - 5.1 mmol/L   Chloride 100 98 - 111 mmol/L   CO2 27 22 - 32 mmol/L   Glucose, Bld 106 (H) 70 - 99 mg/dL   BUN 24 (H) 8 - 23 mg/dL   Creatinine, Ser 7.82 0.61 - 1.24 mg/dL   Calcium 9.9 8.9 - 95.6 mg/dL   Total Protein 7.3 6.5 - 8.1 g/dL   Albumin 4.2 3.5 - 5.0 g/dL   AST 21 15 - 41 U/L   ALT 29 0 - 44 U/L   Alkaline Phosphatase 54 38 - 126 U/L   Total Bilirubin 0.6 0.3 - 1.2 mg/dL   GFR, Estimated >21 >30 mL/min   Anion gap 12 5 - 15  Urinalysis, Routine w reflex microscopic -Urine, Clean Catch  Result Value Ref Range   Color, Urine YELLOW YELLOW   APPearance CLEAR CLEAR   Specific Gravity, Urine 1.015 1.005 - 1.030   pH 7.0 5.0 - 8.0   Glucose, UA NEGATIVE NEGATIVE mg/dL   Hgb urine dipstick NEGATIVE NEGATIVE   Bilirubin Urine NEGATIVE NEGATIVE   Ketones, ur  NEGATIVE NEGATIVE mg/dL   Protein, ur NEGATIVE NEGATIVE mg/dL   Nitrite NEGATIVE NEGATIVE   Leukocytes,Ua NEGATIVE NEGATIVE   CT ABDOMEN PELVIS W CONTRAST  Result Date: 09/25/2023 CLINICAL DATA:  Blunt poly trauma. Right lower quadrant pain since last night EXAM: CT ABDOMEN AND PELVIS WITH CONTRAST TECHNIQUE: Multidetector CT imaging of the abdomen and pelvis was performed using the standard protocol following bolus administration of intravenous contrast. RADIATION DOSE REDUCTION: This exam was performed according to the departmental dose-optimization program which includes automated exposure control, adjustment of the mA and/or kV according to patient size and/or use of iterative reconstruction technique. CONTRAST:  OMNIPAQUE IOHEXOL 300 MG/ML  SOLN COMPARISON:  05/23/2023 FINDINGS: Lower chest:  No acute finding.  RCA atheromatous calcification. Hepatobiliary: Transient perfusion anomaly in the right hepatic lobe. No focal abnormality.No evidence of biliary obstruction or stone. Pancreas: Non worrisome calcification in the pancreatic body that is stable. Spleen: Unremarkable. Adrenals/Urinary Tract: Negative adrenals. No hydronephrosis or stone. Renal hilar cysts. No follow-up imaging is recommended. Unremarkable bladder. Stomach/Bowel: Interval sigmoidectomy with unremarkable anastomosis. No bowel obstruction or appendicitis. Vascular/Lymphatic: Scattered atheromatous calcification. No mass or adenopathy. Chronic hazy density throughout the small bowel mesentery without adenopathy or progression attributed to remote inflammation. Reproductive:Prostatectomy with stable appearance Other:  No ascites or pneumoperitoneum. History of recent bowel surgery with small volume fluid along the deep incision ventral to the abdominal wall musculature, likely seroma. Musculoskeletal: No acute abnormalities. Ordinary degenerative changes IMPRESSION: No acute finding.  Unremarkable sequela of recent sigmoidectomy.  Electronically Signed   By: Tiburcio Pea M.D.   On: 09/25/2023 06:13     Radiology CT ABDOMEN PELVIS W CONTRAST  Result Date: 09/25/2023 CLINICAL DATA:  Blunt poly trauma. Right lower quadrant pain since last night EXAM: CT ABDOMEN AND PELVIS WITH CONTRAST TECHNIQUE: Multidetector CT imaging of the abdomen and pelvis was performed using the standard protocol following bolus administration of intravenous contrast. RADIATION DOSE REDUCTION: This exam was performed according to the departmental dose-optimization program which includes automated exposure control, adjustment of the mA and/or kV according to patient size and/or use of iterative reconstruction technique. CONTRAST:  OMNIPAQUE IOHEXOL 300 MG/ML  SOLN COMPARISON:  05/23/2023 FINDINGS: Lower chest:  No acute finding.  RCA atheromatous calcification. Hepatobiliary: Transient perfusion anomaly in the right hepatic lobe. No focal abnormality.No evidence of biliary obstruction or stone. Pancreas: Non worrisome calcification in the pancreatic body that is stable. Spleen: Unremarkable. Adrenals/Urinary Tract: Negative adrenals. No hydronephrosis or stone. Renal hilar cysts. No follow-up imaging is recommended. Unremarkable bladder. Stomach/Bowel: Interval sigmoidectomy with unremarkable anastomosis. No bowel obstruction or appendicitis. Vascular/Lymphatic: Scattered atheromatous calcification. No mass or adenopathy. Chronic hazy density throughout the small bowel mesentery without adenopathy or progression attributed to remote inflammation. Reproductive:Prostatectomy with stable appearance Other: No ascites or pneumoperitoneum. History of recent bowel surgery with small volume fluid along the deep incision ventral to the abdominal wall musculature, likely seroma. Musculoskeletal: No acute abnormalities. Ordinary degenerative changes IMPRESSION: No acute finding.  Unremarkable sequela of recent sigmoidectomy. Electronically Signed   By: Tiburcio Pea  M.D.   On: 09/25/2023 06:13    Procedures Procedures    Medications Ordered in ED Medications  0.9 %  sodium chloride infusion ( Intravenous New Bag/Given 09/25/23 0501)  fentaNYL (SUBLIMAZE) injection 100 mcg (100 mcg Intravenous Given 09/25/23 0459)  ondansetron (ZOFRAN) injection 4 mg (4 mg Intravenous Given 09/25/23 0459)  iohexol (OMNIPAQUE) 300 MG/ML solution 100 mL (100 mLs Intravenous Contrast Given 09/25/23 0537)  ketorolac (TORADOL) 30 MG/ML injection 15 mg (15 mg Intravenous Given 09/25/23 0654)    ED Course/ Medical Decision Making/ A&P                                 Medical Decision Making Patient with RLQ pain this evening   Amount and/or Complexity of Data Reviewed Independent Historian: spouse    Details: See above  External Data Reviewed: notes.    Details: Previous notes reviewed  Labs: ordered.    Details: Normal white count 8.3, normal hemoglobin 14.9, normal platelet count.  Normal sodium 139, normal potassium 3.9, normal creatinine 0.9, normal LFTs Radiology: ordered and independent interpretation performed.    Details: No free air or fluid collection by me on CT Discussion of management or test interpretation with external provider(s): Case d/w Dr. Dwain Sarna of surgery, we reviewed CT and labs.  May add an NSAID for additional pain control.  Follow up as directed.    Risk Prescription drug management. Risk Details: Abdomen is non-surgical.  Normal exam, vitals, labs and imaging.  Differential for the pain includes: gas, bowel spasm, muscle pain.  Will add ibuprofen and lidoderm to patient's tramadol.  Stable for discharge with  close follow up.      Final Clinical Impression(s) / ED Diagnoses Final diagnoses:  Groin pain, right    I have reviewed the triage vital signs and the nursing notes. Pertinent labs & imaging results that were available during my care of the patient were reviewed by me and considered in my medical decision making (see chart for  details). After history, exam, and medical workup I feel the patient has been appropriately medically screened and is safe for discharge home. Pertinent diagnoses were discussed with the patient. Patient was given return precautions.       Micaella Gitto, MD 09/25/23 [phone]

## 2023-09-25 NOTE — ED Notes (Signed)
Called Consult for general surgery @ 06:26 am ED Doctor waiting for a call back

## 2023-09-27 ENCOUNTER — Other Ambulatory Visit: Payer: Self-pay | Admitting: Family Medicine

## 2023-10-14 ENCOUNTER — Other Ambulatory Visit (HOSPITAL_BASED_OUTPATIENT_CLINIC_OR_DEPARTMENT_OTHER): Payer: Self-pay

## 2023-10-14 DIAGNOSIS — Z23 Encounter for immunization: Secondary | ICD-10-CM | POA: Diagnosis not present

## 2023-10-14 MED ORDER — COVID-19 MRNA VAC-TRIS(PFIZER) 30 MCG/0.3ML IM SUSY
0.3000 mL | PREFILLED_SYRINGE | Freq: Once | INTRAMUSCULAR | 0 refills | Status: AC
  Filled 2023-10-14: qty 0.3, 1d supply, fill #0

## 2023-10-14 MED ORDER — INFLUENZA VAC A&B SURF ANT ADJ 0.5 ML IM SUSY
0.5000 mL | PREFILLED_SYRINGE | Freq: Once | INTRAMUSCULAR | 0 refills | Status: AC
  Filled 2023-10-14: qty 0.5, 1d supply, fill #0

## 2023-10-15 ENCOUNTER — Other Ambulatory Visit (HOSPITAL_BASED_OUTPATIENT_CLINIC_OR_DEPARTMENT_OTHER): Payer: Self-pay

## 2023-10-18 ENCOUNTER — Other Ambulatory Visit: Payer: Self-pay | Admitting: Family Medicine

## 2023-10-18 DIAGNOSIS — K219 Gastro-esophageal reflux disease without esophagitis: Secondary | ICD-10-CM

## 2023-10-30 ENCOUNTER — Other Ambulatory Visit: Payer: Self-pay | Admitting: Family Medicine

## 2023-10-30 DIAGNOSIS — M545 Low back pain, unspecified: Secondary | ICD-10-CM

## 2023-11-28 ENCOUNTER — Encounter: Payer: Self-pay | Admitting: Family Medicine

## 2023-11-28 ENCOUNTER — Ambulatory Visit (INDEPENDENT_AMBULATORY_CARE_PROVIDER_SITE_OTHER): Payer: Medicare Other | Admitting: Family Medicine

## 2023-11-28 VITALS — BP 134/80 | HR 71 | Temp 98.0°F | Resp 16 | Ht 68.0 in | Wt 209.8 lb

## 2023-11-28 DIAGNOSIS — M545 Low back pain, unspecified: Secondary | ICD-10-CM | POA: Diagnosis not present

## 2023-11-28 MED ORDER — METAXALONE 800 MG PO TABS: 800.0000 mg | ORAL_TABLET | Freq: Three times a day (TID) | ORAL | 0 refills | Status: DC

## 2023-11-28 MED ORDER — METHYLPREDNISOLONE ACETATE 80 MG/ML IJ SUSP
80.0000 mg | Freq: Once | INTRAMUSCULAR | Status: AC
Start: 2023-11-28 — End: 2023-11-28
  Administered 2023-11-28: 80 mg via INTRAMUSCULAR

## 2023-11-28 NOTE — Progress Notes (Signed)
Musculoskeletal Exam  Patient: Zachary [name] [facility] DOB: 11-08-1951  DOS: 11/28/2023  SUBJECTIVE:  Chief Complaint:   Low back pain  CONNIE BISON is a 72 y.o.  male for evaluation and treatment of back pain.   Onset:  2 weeks ago.  Spasmed without obvious cause.  Happened 3 times in last 4 mo.  Location: lower L Character:  sharp  Progression of issue:  is unchanged Associated symptoms: no bruising, redness, swelling Denies bowel/bladder incontinence or weakness Treatment: to date has been ice, lidocaine patches, table inversion, home exercises, and heat.   Neurovascular symptoms: no  Past Medical History:  Diagnosis Date   Allergy Seasonal/dust   take Loratadine 10 mg daily   Arthritis    Articular cartilage disease    left shoulder   CAD (coronary artery disease)    Diverticulitis    Dyslipidemia (high LDL; low HDL) 08/15/2016   GERD (gastroesophageal reflux disease)    Hearing problem    hearing deficit   Hemorrhoids    History of back surgery    Hypertension    off meds since 05/25/23 -   Hypothyroidism    Prostate cancer (HCC) 2015   treated and in remission   Sclerosing mesenteritis (HCC)    diagnosed 2022   Sleep apnea    uses a cpap   Stroke (HCC)    TIA -affected balance and went through PT.   Wears glasses    Wears hearing aid    both ears    Objective:  VITAL SIGNS: BP 134/80 (BP Location: Left Arm, Patient Position: Sitting, Cuff Size: Normal)   Pulse 71   Temp 98 F (36.7 C) (Oral)   Resp 16   Ht 5\' 8"  (1.727 m)   Wt 209 lb 12.8 oz (95.2 kg)   SpO2 98%   BMI 31.90 kg/m  Constitutional: Well formed, well developed. No acute distress. HENT: Normocephalic, atraumatic.  Thorax & Lungs:  No accessory muscle use Musculoskeletal: low back.   Tenderness to palpation: yes over L ES group Deformity: no Ecchymosis: no Straight leg test: negative for Poor hamstring flexibility b/l. Neurologic: Normal sensory function. No focal deficits  noted. DTR's equal and symmetric in LE's. No clonus. 5/5 hip flexion b/l.  Psychiatric: Normal mood. Age appropriate judgment and insight. Alert & oriented x 3.    Assessment:  Recurrent low back pain - Plan: metaxalone (SKELAXIN) 800 MG tablet, methylPREDNISolone acetate (DEPO-MEDROL) injection 80 mg  Plan: Stretches/exercises, heat, ice, Tylenol, Depomedrol injection today. Needs to be diligent w HEP. Could refer to PT if he prefers, he will message.  F/u prn. The patient voiced understanding and agreement to the plan.   Jilda Roche [place], DO 11/28/23  1:41 PM

## 2023-11-28 NOTE — Patient Instructions (Addendum)
Heat (pad or rice pillow in microwave) over affected area, 10-15 minutes twice daily.   Ice/cold pack over area for 10-15 min twice daily.  OK to take Tylenol 1000 mg (2 extra strength tabs) or 975 mg (3 regular strength tabs) every 6 hours as needed.  Please be more consistent with your inverted table and the stretches.  Let us know if you need anything.

## 2023-12-08 DIAGNOSIS — H3561 Retinal hemorrhage, right eye: Secondary | ICD-10-CM | POA: Diagnosis not present

## 2023-12-08 DIAGNOSIS — H2513 Age-related nuclear cataract, bilateral: Secondary | ICD-10-CM | POA: Diagnosis not present

## 2023-12-13 ENCOUNTER — Other Ambulatory Visit: Payer: Self-pay | Admitting: Family Medicine

## 2023-12-27 ENCOUNTER — Other Ambulatory Visit: Payer: Self-pay | Admitting: Family Medicine

## 2024-01-10 ENCOUNTER — Other Ambulatory Visit: Payer: Self-pay | Admitting: Family Medicine

## 2024-01-17 ENCOUNTER — Other Ambulatory Visit: Payer: Self-pay | Admitting: Family Medicine

## 2024-01-17 DIAGNOSIS — K219 Gastro-esophageal reflux disease without esophagitis: Secondary | ICD-10-CM

## 2024-02-13 ENCOUNTER — Other Ambulatory Visit: Payer: Self-pay | Admitting: Family Medicine

## 2024-02-13 ENCOUNTER — Encounter: Payer: Self-pay | Admitting: Family Medicine

## 2024-02-13 ENCOUNTER — Ambulatory Visit (INDEPENDENT_AMBULATORY_CARE_PROVIDER_SITE_OTHER): Payer: Medicare Other | Admitting: Family Medicine

## 2024-02-13 VITALS — BP 130/78 | HR 86 | Temp 98.0°F | Resp 16 | Ht 68.0 in | Wt 213.2 lb

## 2024-02-13 DIAGNOSIS — D485 Neoplasm of uncertain behavior of skin: Secondary | ICD-10-CM

## 2024-02-13 DIAGNOSIS — L820 Inflamed seborrheic keratosis: Secondary | ICD-10-CM

## 2024-02-13 DIAGNOSIS — L918 Other hypertrophic disorders of the skin: Secondary | ICD-10-CM | POA: Diagnosis not present

## 2024-02-13 DIAGNOSIS — M25561 Pain in right knee: Secondary | ICD-10-CM | POA: Diagnosis not present

## 2024-02-13 NOTE — Progress Notes (Signed)
Chief Complaint  Patient presents with   Nevus    Discuss Nevus    Zachary [name] is a 73 y.o. male here for a skin complaint.  Duration: 5 months Location: L neck Pruritic? No Painful? Yes Drainage? No New soaps/lotions/topicals/detergents? No Sick contacts? No Other associated symptoms: no redness Therapies tried thus far: none  Several years of a lesion on the R side of his face. He thinks it may be getting bigger. No pain, drainage, redness, itching. Has not tried anything at home.   3 d ago his R knee gave out. He was walking. No inj or change in activity otherwise. Swelling started soon after, has improved some. Took some Advil and used ice/heat/elevation which did help. No bruising/redness, ROM OK. Pain much better.   Past Medical History:  Diagnosis Date   Allergy Seasonal/dust   take Loratadine 10 mg daily   Arthritis    Articular cartilage disease    left shoulder   CAD (coronary artery disease)    Diverticulitis    Dyslipidemia (high LDL; low HDL) 08/15/2016   GERD (gastroesophageal reflux disease)    Hearing problem    hearing deficit   Hemorrhoids    History of back surgery    Hypertension    off meds since 05/25/23 -   Hypothyroidism    Prostate cancer (HCC) 2015   treated and in remission   Sclerosing mesenteritis (HCC)    diagnosed 2022   Sleep apnea    uses a cpap   Stroke (HCC)    TIA -affected balance and went through PT.   Wears glasses    Wears hearing aid    both ears    BP 130/78 (BP Location: Left Arm, Patient Position: Sitting)   Pulse 86   Temp 98 F (36.7 C) (Oral)   Resp 16   Ht 5\' 8"  (1.727 m)   Wt 213 lb 3.2 oz (96.7 kg)   SpO2 98%   BMI 32.42 kg/m  Gen: awake, alert, appearing stated age Lungs: No accessory muscle use Skin: Skin tag noted posterior to the left angle of the mandible.. No drainage, erythema, TTP, fluctuance, excoriation There is a raised and slightly scaly lesion over the right TMJ measuring 1.4 x 1.1 cm.   There is no erythema, drainage, TTP, fluctuance, or excoriation. MSK: Slight effusion of the right knee, no TTP, no deformity, no ecchymosis or erythema.  Negative patellar apprehension/grind, varus/valgus stress, McMurray's, Stines, anterior/posterior drawer Psych: Age appropriate judgment and insight  Procedure note; shave biopsy Informed consent was obtained. The area was cleaned with alcohol and injected with 1 mL of 1% lidocaine with epinephrine. A Dermablade was slightly bent and used to cut under the area of interest. Because this was clearly a skin tag, it was not sent to the laboratory. The area was then cauterized ensuring adequate hemostasis. The area was dressed with triple antibiotic ointment and a bandage. There were no complications noted. This process was repeated on the right jaw area where 2.5 cc of 1% lidocaine with epinephrine was injected.  This specimen was sent for laboratory evaluation. Bleeding was controlled with cautery. The patient tolerated the procedure well.  NEOPLASM OF UNCERTAIN BEHAVIOR OF SKIN - Plan: Surgical pathology, PR SHAVING SKIN LESION 1 S/N/H/F/G DIAM 1.1-2.0 CM  Inflamed skin tag - Plan: Surgical pathology  Acute pain of right knee  Biopsy. Aftercare instructions verbalized and written down. Warning signs and symptoms verbalized and written down in AVS.  As above.  Seems to be improving. Stretches/exercises.  F/u prn. The patient voiced understanding and agreement to the plan.  Jilda Roche [place], DO 02/13/24 11:13 AM

## 2024-02-13 NOTE — Patient Instructions (Addendum)
Do not shower for the rest of the day. When you do wash it, use only soap and water. Do not vigorously scrub. Apply triple antibiotic ointment (like Neosporin) twice daily. Keep the area clean and dry.   Things to look out for: increasing pain not relieved by ibuprofen/acetaminophen, fevers, spreading redness, drainage of pus, or foul odor.  Give [place] 1 week to get the results of your biopsy back.  Heat (pad or rice pillow in microwave) over affected area, 10-15 minutes twice daily.   Ice/cold pack over area for 10-15 min twice daily.  OK to take Tylenol 1000 mg (2 extra strength tabs) or 975 mg (3 regular strength tabs) every 6 hours as needed.  Let us know if you need anything.  Knee Exercises It is normal to feel mild stretching, pulling, tightness, or discomfort as you do these exercises, but you should stop right away if you feel sudden pain or your pain gets worse.  STRETCHING AND RANGE OF MOTION EXERCISES  These exercises warm up your muscles and joints and improve the movement and flexibility of your knee. These exercises also help to relieve pain, numbness, and tingling. Exercise A: Knee Extension, Prone  Lie on your abdomen on a bed. Place your left / right knee just beyond the edge of the surface so your knee is not on the bed. You can put a towel under your left / right thigh just above your knee for comfort. Relax your leg muscles and allow gravity to straighten your knee. You should feel a stretch behind your left / right knee. Hold this position for 30 seconds. Scoot up so your knee is supported between repetitions. Repeat 2 times. Complete this stretch 3 times per week. Exercise B: Knee Flexion, Active     Lie on your back with both knees straight. If this causes back discomfort, bend your left / right knee so your foot is flat on the floor. Slowly slide your left / right heel back toward your buttocks until you feel a gentle stretch in the front of your knee or  thigh. Hold this position for 30 seconds. Slowly slide your left / right heel back to the starting position. Repeat 2 times. Complete this exercise 3 times per week. Exercise C: Quadriceps, Prone     Lie on your abdomen on a firm surface, such as a bed or padded floor. Bend your left / right knee and hold your ankle. If you cannot reach your ankle or pant leg, loop a belt around your foot and grab the belt instead. Gently pull your heel toward your buttocks. Your knee should not slide out to the side. You should feel a stretch in the front of your thigh and knee. Hold this position for 30 seconds. Repeat 2 times. Complete this stretch 3 times per week. Exercise D: Hamstring, Supine  Lie on your back. Loop a belt or towel over the ball of your left / right foot. The ball of your foot is on the walking surface, right under your toes. Straighten your left / right knee and slowly pull on the belt to raise your leg until you feel a gentle stretch behind your knee. Do not let your left / right knee bend while you do this. Keep your other leg flat on the floor. Hold this position for 30 seconds. Repeat 2 times. Complete this stretch 3 times per week. STRENGTHENING EXERCISES  These exercises build strength and endurance in your knee. Endurance is the ability  to use your muscles for a long time, even after they get tired. Exercise E: Quadriceps, Isometric     Lie on your back with your left / right leg extended and your other knee bent. Put a rolled towel or small pillow under your knee if told by your health care provider. Slowly tense the muscles in the front of your left / right thigh. You should see your kneecap slide up toward your hip or see increased dimpling just above the knee. This motion will push the back of the knee toward the floor. For 3 seconds, keep the muscle as tight as you can without increasing your pain. Relax the muscles slowly and completely. Repeat for 10 total  reps Repeat 2 ti mes. Complete this exercise 3 times per week. Exercise F: Straight Leg Raises - Quadriceps  Lie on your back with your left / right leg extended and your other knee bent. Tense the muscles in the front of your left / right thigh. You should see your kneecap slide up or see increased dimpling just above the knee. Your thigh may even shake a bit. Keep these muscles tight as you raise your leg 4-6 inches (10-15 cm) off the floor. Do not let your knee bend. Hold this position for 3 seconds. Keep these muscles tense as you lower your leg. Relax your muscles slowly and completely after each repetition. 10 total reps. Repeat 2 times. Complete this exercise 3 times per week.  Exercise G: Hamstring Curls     If told by your health care provider, do this exercise while wearing ankle weights. Begin with 5 lb weights (optional). Then increase the weight by 1 lb (0.5 kg) increments. Do not wear ankle weights that are more than 20 lbs to start with. Lie on your abdomen with your legs straight. Bend your left / right knee as far as you can without feeling pain. Keep your hips flat against the floor. Hold this position for 3 seconds. Slowly lower your leg to the starting position. Repeat for 10 reps.  Repeat 2 times. Complete this exercise 3 times per week. Exercise H: Squats (Quadriceps)  Stand in front of a table, with your feet and knees pointing straight ahead. You may rest your hands on the table for balance but not for support. Slowly bend your knees and lower your hips like you are going to sit in a chair. Keep your weight over your heels, not over your toes. Keep your lower legs upright so they are parallel with the table legs. Do not let your hips go lower than your knees. Do not bend lower than told by your health care provider. If your knee pain increases, do not bend as low. Hold the squat position for 1 second. Slowly push with your legs to return to standing. Do not use  your hands to pull yourself to standing. Repeat 2 times. Complete this exercise 3 times per week. Exercise I: Wall Slides (Quadriceps)     Lean your back against a smooth wall or door while you walk your feet out 18-24 inches (46-61 cm) from it. Place your feet hip-width apart. Slowly slide down the wall or door until your knees Repeat 2 times. Complete this exercise every other day. Exercise K: Straight Leg Raises - Hip Abductors  Lie on your side with your left / right leg in the top position. Lie so your head, shoulder, knee, and hip line up. You may bend your bottom knee to help you  keep your balance. Roll your hips slightly forward so your hips are stacked directly over each other and your left / right knee is facing forward. Leading with your heel, lift your top leg 4-6 inches (10-15 cm). You should feel the muscles in your outer hip lifting. Do not let your foot drift forward. Do not let your knee roll toward the ceiling. Hold this position for 3 seconds. Slowly return your leg to the starting position. Let your muscles relax completely after each repetition. 10 total reps. Repeat 2 times. Complete this exercise 3 times per week. Exercise J: Straight Leg Raises - Hip Extensors  Lie on your abdomen on a firm surface. You can put a pillow under your hips if that is more comfortable. Tense the muscles in your buttocks and lift your left / right leg about 4-6 inches (10-15 cm). Keep your knee straight as you lift your leg. Hold this position for 3 seconds. Slowly lower your leg to the starting position. Let your leg relax completely after each repetition. Repeat 2 times. Complete this exercise 3 times per week. Document Released: 10/23/2005 Document Revised: 09/02/2016 Document Reviewed: 10/15/2015 Elsevier Interactive Patient Education  2017 [employer].

## 2024-02-16 ENCOUNTER — Encounter: Payer: Self-pay | Admitting: Family Medicine

## 2024-02-16 LAB — DERMATOLOGY PATHOLOGY

## 2024-03-13 ENCOUNTER — Other Ambulatory Visit: Payer: Self-pay | Admitting: Family Medicine

## 2024-03-27 ENCOUNTER — Other Ambulatory Visit: Payer: Self-pay | Admitting: Family Medicine

## 2024-04-05 ENCOUNTER — Other Ambulatory Visit: Payer: Self-pay | Admitting: Family Medicine

## 2024-04-17 ENCOUNTER — Other Ambulatory Visit: Payer: Self-pay | Admitting: Family Medicine

## 2024-04-17 DIAGNOSIS — K219 Gastro-esophageal reflux disease without esophagitis: Secondary | ICD-10-CM

## 2024-04-28 ENCOUNTER — Other Ambulatory Visit: Payer: Self-pay | Admitting: Family Medicine

## 2024-04-28 ENCOUNTER — Encounter: Payer: Self-pay | Admitting: Family Medicine

## 2024-04-28 ENCOUNTER — Ambulatory Visit (INDEPENDENT_AMBULATORY_CARE_PROVIDER_SITE_OTHER): Admitting: Family Medicine

## 2024-04-28 VITALS — BP 128/76 | HR 63 | Temp 98.0°F | Resp 16 | Ht 68.0 in | Wt 213.0 lb

## 2024-04-28 DIAGNOSIS — E039 Hypothyroidism, unspecified: Secondary | ICD-10-CM | POA: Diagnosis not present

## 2024-04-28 DIAGNOSIS — Z8546 Personal history of malignant neoplasm of prostate: Secondary | ICD-10-CM

## 2024-04-28 DIAGNOSIS — E782 Mixed hyperlipidemia: Secondary | ICD-10-CM | POA: Diagnosis not present

## 2024-04-28 DIAGNOSIS — Z125 Encounter for screening for malignant neoplasm of prostate: Secondary | ICD-10-CM

## 2024-04-28 DIAGNOSIS — L820 Inflamed seborrheic keratosis: Secondary | ICD-10-CM | POA: Diagnosis not present

## 2024-04-28 DIAGNOSIS — D485 Neoplasm of uncertain behavior of skin: Secondary | ICD-10-CM

## 2024-04-28 LAB — LIPID PANEL
Cholesterol: 137 mg/dL (ref 0–200)
HDL: 39.9 mg/dL (ref >39.00)
LDL Cholesterol: 77 mg/dL (ref 0–99)
NonHDL: 97.58
Total CHOL/HDL Ratio: 3
Triglycerides: 103 mg/dL (ref 0.0–149.0)
VLDL: 20.6 mg/dL (ref 0.0–40.0)

## 2024-04-28 LAB — COMPREHENSIVE METABOLIC PANEL WITH GFR
ALT: 26 U/L (ref 0–53)
AST: 22 U/L (ref 0–37)
Albumin: 4.5 g/dL (ref 3.5–5.2)
Alkaline Phosphatase: 59 U/L (ref 39–117)
BUN: 18 mg/dL (ref 6–23)
CO2: 29 meq/L (ref 19–32)
Calcium: 10 mg/dL (ref 8.4–10.5)
Chloride: 102 meq/L (ref 96–112)
Creatinine, Ser: 0.93 mg/dL (ref 0.40–1.50)
GFR: 83.26 mL/min (ref >60.00)
Glucose, Bld: 101 mg/dL — ABNORMAL HIGH (ref 70–99)
Potassium: 4.1 meq/L (ref 3.5–5.1)
Sodium: 139 meq/L (ref 135–145)
Total Bilirubin: 0.6 mg/dL (ref 0.2–1.2)
Total Protein: 6.6 g/dL (ref 6.0–8.3)

## 2024-04-28 LAB — PSA, MEDICARE: PSA: 0 ng/mL — ABNORMAL LOW (ref 0.10–4.00)

## 2024-04-28 LAB — TSH: TSH: 5.09 u[IU]/mL (ref 0.35–5.50)

## 2024-04-28 LAB — T4, FREE: Free T4: 0.92 ng/dL (ref 0.60–1.60)

## 2024-04-28 NOTE — Addendum Note (Signed)
 Addended by: Phillp Dolores M on: 04/28/2024 08:08 AM   Modules accepted: Orders

## 2024-04-28 NOTE — Patient Instructions (Signed)
 Give us  2-3 business days to get the results of your labs back.   Keep the diet clean and stay active.  Do not shower for the rest of the day. When you do wash it, use only soap and water. Do not vigorously scrub. Apply triple antibiotic ointment (like Neosporin) twice daily. Keep the area clean and dry.   Things to look out for: increasing pain not relieved by ibuprofen /acetaminophen , fevers, spreading redness, drainage of pus, or foul odor.  Give us  1 week to get the results of your biopsy back.  Let us  know if you need anything.

## 2024-04-28 NOTE — Progress Notes (Signed)
 Chief Complaint  Patient presents with   Foreign Body in Skin    Foreign Body in Skin     Subjective: Hyperlipidemia Patient presents for Hyperlipidemia follow up. Currently taking pravastatin  80 mg/d and compliance with treatment thus far has been good. He denies myalgias. He is adhering to a healthy diet. Exercise: active at work No CP or SOB. The patient is known to have coexisting coronary artery disease.  Hypothyroidism Patient presents for follow-up of hypothyroidism.  Reports compliance with medication- levothyroxine  100 mcg/d. Current symptoms include: denies fatigue, weight changes, heat/cold intolerance, bowel/skin changes or CVS symptoms He believes his dose should be not significantly changed  Duration: several months Location: R portion of scalp Pruritic? Yes Painful? Yes Drainage? No New soaps/lotions/topicals/detergents? No Trauma? No Other associated symptoms: Getting bigger Therapies tried thus far: none  Past Medical History:  Diagnosis Date   Allergy Seasonal/dust   take Loratadine  10 mg daily   Arthritis    Articular cartilage disease    left shoulder   CAD (coronary artery disease)    Diverticulitis    Dyslipidemia (high LDL; low HDL) 08/15/2016   GERD (gastroesophageal reflux disease)    Hearing problem    hearing deficit   Hemorrhoids    History of back surgery    Hypertension    off meds since 05/25/23 -   Hypothyroidism    Prostate cancer (HCC) 2015   treated and in remission   Sclerosing mesenteritis (HCC)    diagnosed 2022   Sleep apnea    uses a cpap   Stroke (HCC)    TIA -affected balance and went through PT.   Wears glasses    Wears hearing aid    both ears    Objective: BP 128/76 (BP Location: Left Arm, Patient Position: Sitting)   Pulse 63   Temp 98 F (36.7 C) (Oral)   Resp 16   Ht 5\' 8"  (1.727 m)   Wt 213 lb (96.6 kg)   SpO2 98%   BMI 32.39 kg/m  General: Awake, appears stated age Skin: over R temporal region  within hair, there is a  pedunculated lesion overlaying a scaly and slightly hyperpigmented and raised area measuring 1.2 x 0.4 cm in diameter. No erythema, ttp, fluctuance, drainage.  Heart: RRR, no LE edema, no bruits Lungs: CTAB, no rales, wheezes or rhonchi. No accessory muscle use Psych: Age appropriate judgment and insight, normal affect and mood  Procedure note; shave biopsy-right portion of scalp; indication therapeutic and diagnostic Informed consent was obtained. The area was cleaned with alcohol and injected with 0.7 mL of 1% lidocaine  with epinephrine . A Dermablade was slightly bent and used to cut under the area of interest. The specimen was placed in a sterile specimen cup and sent to the lab. The area was then cauterized ensuring adequate hemostasis. The area was dressed with triple antibiotic ointment and a bandage. There were no complications noted. The patient tolerated the procedure well.  Assessment and Plan: Mixed hyperlipidemia - Plan: Comprehensive metabolic panel with GFR, Lipid panel  Hypothyroidism, unspecified type - Plan: TSH, T4, free  Neoplasm of uncertain behavior of skin - Plan: PR SHAVING SKIN LESION 1 S/N/H/F/G DIAM 1.1-2.0 CM  History of prostate cancer  Screening for prostate cancer - Plan: PSA, Medicare ( [facility] only)  Chronic, stable.  Continue pravastatin  80 mg daily.  Counseled on diet and exercise.  Check labs. Chronic, stable.  Continue levothyroxine  100 mcg daily. Shave biopsy today.  Sent to  pathology.  Aftercare instructions verbalized written down. Check PSA along with the rest of his labs given history of prostate cancer. F/u in 6 mo. The patient voiced understanding and agreement to the plan.  [name] [place], DO 04/28/24  7:53 AM

## 2024-05-04 LAB — DERMATOLOGY PATHOLOGY

## 2024-05-05 ENCOUNTER — Ambulatory Visit: Payer: Self-pay | Admitting: Family Medicine

## 2024-06-12 ENCOUNTER — Other Ambulatory Visit: Payer: Self-pay | Admitting: Family Medicine

## 2024-06-26 ENCOUNTER — Other Ambulatory Visit: Payer: Self-pay | Admitting: Family Medicine

## 2024-07-03 ENCOUNTER — Other Ambulatory Visit: Payer: Self-pay | Admitting: Family Medicine

## 2024-07-10 ENCOUNTER — Other Ambulatory Visit: Payer: Self-pay | Admitting: Family Medicine

## 2024-07-10 DIAGNOSIS — K219 Gastro-esophageal reflux disease without esophagitis: Secondary | ICD-10-CM

## 2024-07-12 ENCOUNTER — Encounter: Payer: Self-pay | Admitting: Emergency Medicine

## 2024-07-12 ENCOUNTER — Ambulatory Visit
Admission: EM | Admit: 2024-07-12 | Discharge: 2024-07-12 | Disposition: A | Discharge status: Home / Self Care | Attending: Family Medicine | Admitting: Family Medicine

## 2024-07-12 DIAGNOSIS — Z23 Encounter for immunization: Secondary | ICD-10-CM

## 2024-07-12 DIAGNOSIS — S61032A Puncture wound without foreign body of left thumb without damage to nail, initial encounter: Secondary | ICD-10-CM

## 2024-07-12 MED ORDER — CEPHALEXIN 500 MG PO CAPS: 500.0000 mg | ORAL_CAPSULE | Freq: Two times a day (BID) | ORAL | 0 refills | Status: AC

## 2024-07-12 MED ORDER — TETANUS-DIPHTH-ACELL PERTUSSIS 5-2.5-18.5 LF-MCG/0.5 IM SUSY
0.5000 mL | PREFILLED_SYRINGE | Freq: Once | INTRAMUSCULAR | Status: AC
  Administered 2024-07-12: 0.5 mL via INTRAMUSCULAR

## 2024-07-12 NOTE — ED Triage Notes (Signed)
 Patient punctured his left thumb yesterday during a wood working class.  Last Tdap 01/2013.  Thumb is still bleeding.

## 2024-07-12 NOTE — Discharge Instructions (Addendum)
 Advised patient to keep wound area open to air to allow healing by secondary intention/scab formation.  Advised patient take medication as directed with food to completion.  Encouraged to increase daily water intake to 64 ounces per day while taking this medication.  Advised if symptoms worsen and/or unresolved please follow-up with your PCP or here for further evaluation.

## 2024-07-12 NOTE — ED Provider Notes (Signed)
 Zach[facility] [name] CARE    CSN: [id number] Arrival date & time: 07/12/24  0807      History   Chief Complaint Chief Complaint  Patient presents with   Finger Injury    HPI Zachary [name] is a 73 y.o. male.   HPI 73 year old male presents with puncture wound of left thumb that occurred yesterday, Sunday, 07/11/2024.  While working in woodworking class.  Patient reports his last Tdap was in February 2014.  Patient reports his left thumb is still bleeding.  Patient is accompanied by his wife today. PMH significant for CAD, HTN, and prostate cancer.  Past Medical History:  Diagnosis Date   Allergy Seasonal/dust   take Loratadine 10 mg daily   Arthritis    Articular cartilage disease    left shoulder   CAD (coronary artery disease)    Diverticulitis    Dyslipidemia (high LDL; low HDL) 08/15/2016   GERD (gastroesophageal reflux disease)    Hearing problem    hearing deficit   Hemorrhoids    History of back surgery    Hypertension    off meds since 05/25/23 -   Hypothyroidism    Prostate cancer (HCC) 2015   treated and in remission   Sclerosing mesenteritis (HCC)    diagnosed 2022   Sleep apnea    uses a cpap   Stroke (HCC)    TIA -affected balance and went through PT.   Wears glasses    Wears hearing aid    both ears    Patient Active Problem List   Diagnosis Date Noted   S/P laparoscopic-assisted sigmoidectomy 09/11/2023   Cough 05/24/2023   Acute diverticulitis 05/23/2023   Hearing aid worn 02/28/2021   Coronary artery disease involving autologous artery coronary bypass graft with angina pectoris (HCC) 01/22/2021   Chest pain 01/15/2021   Ingrowing nail, left great toe 07/12/2020   Radiation cystitis 04/09/2019   Bladder tumor 03/31/2019   Contracture of left Achilles tendon 10/26/2018   Plantar fasciitis of left foot 08/25/2018   Prediabetes 05/27/2018   Stomatitis 04/17/2017   Dyslipidemia (high LDL; low HDL) 08/15/2016   S/P arthroscopy of shoulder  05/31/2016   History of prostate cancer 05/09/2016   Complete rotator cuff tear of left shoulder 07/05/2015   Tennis elbow 05/30/2014   Rotator cuff (capsule) sprain 03/10/2014   Right shoulder pain 02/11/2014   Memory difficulty 12/18/2012   Gynecomastia, male 07/30/2012   OSA (obstructive sleep apnea) 04/23/2011   External hemorrhoids 02/12/2011   Prostate cancer (HCC) 11/12/2010   PLANTAR FASCIITIS 04/09/2010   Hypothyroidism 12/08/2009   NEOPLASM OF UNCERTAIN BEHAVIOR OF SKIN 03/10/2009   Osteoarthritis 02/14/2009   Mixed hyperlipidemia 02/08/2009   Hearing loss 02/08/2009   Essential hypertension 02/08/2009   Allergic rhinitis 02/08/2009   GERD without esophagitis 02/08/2009   PROTEINURIA 02/08/2009   DIVERTICULITIS, HX OF 02/08/2009    Past Surgical History:  Procedure Laterality Date   BACK SURGERY  12/23/1976   herniated disksurgery   CARDIAC CATHETERIZATION  08/01/2010   COLONOSCOPY     20 07, 2017 and 2023   EXAM UNDER ANESTHESIA WITH MANIPULATION OF SHOULDER Right 09/15/2014   Procedure: RIGHT SHOULDER MANIPULATION UNDER ANESTHESIA;  Surgeon: Toribio JULIANNA Chancy, MD;  Location: [facility];  Service: Orthopedics;  Laterality: Right;   FLEXIBLE SIGMOIDOSCOPY  09/11/2023   Procedure: FLEXIBLE SIGMOIDOSCOPY;  Surgeon: Teresa Lonni HERO, MD;  Location: [facility];  Service: General;;   HEMORRHOID SURGERY  08/23/2006   LEFT HEART  CATH AND CORONARY ANGIOGRAPHY N/A 01/16/2021   Procedure: LEFT HEART CATH AND CORONARY ANGIOGRAPHY;  Surgeon: Burnard Debby LABOR, MD;  Location: [facility];  Service: Cardiovascular;  Laterality: N/A;   SHOULDER ARTHROSCOPY WITH DISTAL CLAVICLE RESECTION Left 07/07/2015   Procedure: SHOULDER ARTHROSCOPY WITH DISTAL CLAVICLE RESECTION;  Surgeon: Toribio Chancy, MD;  Location: [facility];  Service: Orthopedics;  Laterality: Left;   SHOULDER ARTHROSCOPY WITH ROTATOR CUFF REPAIR Left 12/07/2015   Procedure: LEFT SHOULDER  ARTHROSCOPY DEBRIDEMENT, WITH ROTATOR CUFF REPAIR;  Surgeon: Toribio JULIANNA Chancy, MD;  Location: [facility];  Service: Orthopedics;  Laterality: Left;   SHOULDER ARTHROSCOPY WITH ROTATOR CUFF REPAIR AND SUBACROMIAL DECOMPRESSION Left 07/07/2015   Procedure: LEFT SHOULDER SCOPE DEBRIDEMENT, SUBACROMIAL DECOMPRESSION, DISTAL CLAVICULECTOMY, ROTATOR CUFF REPAIR  ;  Surgeon: Toribio Chancy, MD;  Location: [facility];  Service: Orthopedics;  Laterality: Left;  ANESTHESIA: GENERAL, PRE/POST OP SCALENE   SHOULDER ARTHROSCOPY WITH SUBACROMIAL DECOMPRESSION, ROTATOR CUFF REPAIR AND BICEP TENDON REPAIR Right 03/10/2014   Procedure: RIGHT SHOULDER ARTHROSCOPY WITH SUBACROMIAL DECOMPRESSION, PARTIAL ACROMIOPLASTY WITH CORACOAROMIAL LABRUM DEBRIDEMENT RELEASE DISTAL CLAVICULECTOMY,  ROTATOR CUFF REPAIR AND EXTENSIVE DEBRIDEMENT;  Surgeon: Toribio JULIANNA Chancy, MD;  Location: [facility];  Service: Orthopedics;  Laterality: Right;   SPINE SURGERY  Disc repaired 12/1976   TENDON REPAIR  06/06/2011   right elbow, Dr. Chancy   XI ROBOTIC ASSISTED LOWER ANTERIOR RESECTION N/A 09/11/2023   Procedure: XI ROBOTIC ASSISTED LOW ANTERIOR RESECTION/ SIGMOIDECTOMY AND INTRAOPERATIVE ASSESSMENT OF PERFUSION USING ICG DYE;  Surgeon: Teresa Lonni HERO, MD;  Location: [facility];  Service: General;  Laterality: N/A;       Home Medications    Prior to Admission medications   Medication Sig Start Date End Date Taking? Authorizing Provider  aspirin  EC 81 MG EC tablet Take 81 mg by mouth daily.   Yes [provider]  cephALEXin  (KEFLEX ) 500 MG capsule Take 1 capsule (500 mg total) by mouth 2 (two) times daily for 7 days. 07/12/24 07/19/24 Yes Teddy Sharper, FNP  fluticasone  (FLONASE ) 50 MCG/ACT nasal spray Use 2 spray(s) in each nostril once daily 07/05/24  Yes Wendling, Mabel Mt, DO  Hydrocortisone  (PREPARATION H EX) Apply 1 Application topically daily as needed (hemorrhoids).   Yes  [provider]  levothyroxine  (SYNTHROID ) 100 MCG tablet Take 1 tablet by mouth once daily 06/28/24  Yes Wendling, Mabel Mt, DO  loratadine  (CLARITIN ) 10 MG tablet Take 10 mg by mouth daily. 04/25/16  Yes [provider]  melatonin 5 MG TABS Take 5 mg by mouth at bedtime as needed (sleep).   Yes [provider]  omeprazole  (PRILOSEC) 20 MG capsule Take 1 capsule by mouth once daily 04/19/24  Yes Wendling, Mabel Mt, DO  pravastatin  (PRAVACHOL ) 80 MG tablet Take 1 tablet by mouth once daily 06/14/24  Yes Frann Mabel Mt, DO  sodium bicarbonate  650 MG tablet Take 1 tablet by mouth once daily 04/19/24  Yes Wendling, Mabel Mt, DO    Family History Family History  Problem Relation Age of Onset   Colon polyps Mother    Breast cancer Mother    Arthritis Mother    Cancer Mother    Hearing loss Mother    Miscarriages / [place] Mother    Varicose Veins Mother    Heart disease Father 5   Alcohol abuse Father    Asthma Father    Hypertension Father    Cancer Brother  Alcohol abuse Other    Arthritis Other    Cancer Other        Breast, Prostate   Coronary artery disease Other    Irritable bowel syndrome Other    Cystic fibrosis Other    Anxiety disorder Son    Colon cancer Neg Hx    Esophageal cancer Neg Hx    Stomach cancer Neg Hx    Rectal cancer Neg Hx     Social History Social History   Tobacco Use   Smoking status: Never   Smokeless tobacco: Never  Vaping Use   Vaping status: Never Used  Substance Use Topics   Alcohol use: Yes    Alcohol/week: 1.0 standard drink of alcohol    Types: 1 Glasses of wine per week    Comment: Glass of wine occasionally   Drug use: No     Allergies   Oxycodone , Atorvastatin , Mobic  [meloxicam ], and Simvastatin   Review of Systems Review of Systems  Skin:  Positive for wound.  All other systems reviewed and are negative.    Physical Exam Triage Vital Signs ED Triage Vitals   Encounter Vitals Group     BP 07/12/24 0833 (!) 180/85     Girls Systolic BP Percentile --      Girls Diastolic BP Percentile --      Boys Systolic BP Percentile --      Boys Diastolic BP Percentile --      Pulse Rate 07/12/24 0833 (!) 59     Resp 07/12/24 0833 18     Temp 07/12/24 0833 97.8 F (36.6 C)     Temp Source 07/12/24 0833 Oral     SpO2 07/12/24 0833 97 %     Weight 07/12/24 0834 209 lb (94.8 kg)     Height 07/12/24 0834 5' 8 (1.727 m)     Head Circumference --      Peak Flow --      Pain Score 07/12/24 0834 0     Pain Loc --      Pain Education --      Exclude from Growth Chart --    No data found.  Updated Vital Signs BP (!) 180/85 (BP Location: Left Arm)   Pulse (!) 59   Temp 97.8 F (36.6 C) (Oral)   Resp 18   Ht 5' 8 (1.727 m)   Wt 209 lb (94.8 kg)   SpO2 97%   BMI 31.78 kg/m    Physical Exam Vitals and nursing note reviewed.  Constitutional:      Appearance: Normal appearance. He is normal weight.  HENT:     Head: Normocephalic and atraumatic.     Mouth/Throat:     Mouth: Mucous membranes are moist.     Pharynx: Oropharynx is clear.  Eyes:     Extraocular Movements: Extraocular movements intact.     Pupils: Pupils are equal, round, and reactive to light.  Cardiovascular:     Rate and Rhythm: Normal rate and regular rhythm.     Pulses: Normal pulses.     Heart sounds: Normal heart sounds.  Pulmonary:     Effort: Pulmonary effort is normal.     Breath sounds: Normal breath sounds. No wheezing, rhonchi or rales.  Musculoskeletal:        General: Normal range of motion.  Skin:    General: Skin is warm and dry.     Comments: Left thumb (volar aspect of distal phalanx): Tiny (3-4 mm x 1-2  mm) non-bleeding nonerythematous laceration noted please see image below  Neurological:     General: No focal deficit present.     Mental Status: He is alert and oriented to person, place, and time.  Psychiatric:        Mood and Affect: Mood normal.         Behavior: Behavior normal.      UC Treatments / Results  Labs (all labs ordered are listed, but only abnormal results are displayed) Labs Reviewed - No data to display  EKG   Radiology No results found.  Procedures Procedures (including critical care time)  Medications Ordered in UC Medications  Tdap (BOOSTRIX ) injection 0.5 mL (0.5 mLs Intramuscular Given 07/12/24 0904)    Initial Impression / Assessment and Plan / UC Course  I have reviewed the triage vital signs and the nursing notes.  Pertinent labs & imaging results that were available during my care of the patient were reviewed by me and considered in my medical decision making (see chart for details).     MDM: 1.  Puncture wound of left thumb, initial encounter-Tdap  (BOOSTRIX ) 0.5 mL IM provided in clinic prior to discharge Rx'd Keflex  500 mg capsule, take 1 capsule twice daily x 7 days for prophylaxis skin infection. Advised patient to keep wound area open to air to allow healing by secondary intention/scab formation.  Advised patient take medication as directed with food to completion.  Encouraged to increase daily water intake to 64 ounces per day while taking this medication.  Advised if symptoms worsen and/or unresolved please follow-up with your PCP or here for further evaluation.  Patient discharged home, hemodynamically stable. Final Clinical Impressions(s) / UC Diagnoses   Final diagnoses:  Puncture wound of left thumb, initial encounter     Discharge Instructions      Advised patient to keep wound area open to air to allow healing by secondary intention/scab formation.  Advised patient take medication as directed with food to completion.  Encouraged to increase daily water intake to 64 ounces per day while taking this medication.  Advised if symptoms worsen and/or unresolved please follow-up with your PCP or here for further evaluation.     ED Prescriptions     Medication Sig Dispense Auth. Provider    cephALEXin  (KEFLEX ) 500 MG capsule Take 1 capsule (500 mg total) by mouth 2 (two) times daily for 7 days. 14 capsule Imani Sherrin, FNP      PDMP not reviewed this encounter.   Teddy Sharper, FNP 07/12/24 [phone]

## 2024-08-06 ENCOUNTER — Ambulatory Visit (HOSPITAL_BASED_OUTPATIENT_CLINIC_OR_DEPARTMENT_OTHER)
Admission: RE | Admit: 2024-08-06 | Discharge: 2024-08-06 | Disposition: A | Discharge status: Home / Self Care | Source: Ambulatory Visit | Attending: Family Medicine | Admitting: Family Medicine

## 2024-08-06 ENCOUNTER — Other Ambulatory Visit (HOSPITAL_BASED_OUTPATIENT_CLINIC_OR_DEPARTMENT_OTHER): Payer: Self-pay

## 2024-08-06 ENCOUNTER — Ambulatory Visit: Admitting: Family Medicine

## 2024-08-06 VITALS — BP 160/80 | HR 80 | Temp 98.0°F | Resp 18 | Ht 68.0 in | Wt 209.6 lb

## 2024-08-06 DIAGNOSIS — M79642 Pain in left hand: Secondary | ICD-10-CM

## 2024-08-06 DIAGNOSIS — R0609 Other forms of dyspnea: Secondary | ICD-10-CM | POA: Insufficient documentation

## 2024-08-06 DIAGNOSIS — R0602 Shortness of breath: Secondary | ICD-10-CM | POA: Diagnosis not present

## 2024-08-06 DIAGNOSIS — I10 Essential (primary) hypertension: Secondary | ICD-10-CM | POA: Diagnosis not present

## 2024-08-06 MED ORDER — ZOSTER VAC RECOMB ADJUVANTED 50 MCG/0.5ML IM SUSR
0.5000 mL | Freq: Once | INTRAMUSCULAR | 1 refills | Status: AC
  Filled 2024-08-06: qty 0.5, 1d supply, fill #0
  Filled 2024-10-20: qty 0.5, 1d supply, fill #1

## 2024-08-06 MED ORDER — AMLODIPINE BESYLATE 5 MG PO TABS: 5.0000 mg | ORAL_TABLET | Freq: Every day | ORAL | 3 refills | Status: DC

## 2024-08-06 NOTE — Patient Instructions (Addendum)
 Keep the diet clean.   Give us  2-3 business days to get the results of your labs back.   Heat (pad or rice pillow in microwave) over affected area, 10-15 minutes twice daily.   Ice/cold pack over area for 10-15 min twice daily.  OK to take Tylenol  1000 mg (2 extra strength tabs) or 975 mg (3 regular strength tabs) every 6 hours as needed.  EKG looks good.   We will be in touch with your X-ray results.   Check your blood pressures 2-3 times per week, alternating the time of day you check it. If it is high, considering waiting 1-2 minutes and rechecking. If it gets higher, your anxiety is likely creeping up and we should avoid rechecking.   Let us  know if you need anything.  Hand Exercises Hand exercises can be helpful for almost anyone. These exercises can strengthen the hands, improve flexibility and movement, and increase blood flow to the hands. These results can make work and daily tasks easier. Hand exercises can be especially helpful for people who have joint pain from arthritis or have nerve damage from overuse (carpal tunnel syndrome). These exercises can also help people who have injured a hand. Exercises Most of these hand exercises are gentle stretching and motion exercises. It is usually safe to do them often throughout the day. Warming up your hands before exercise may help to reduce stiffness. You can do this with gentle massage or by placing your hands in warm water for 10-15 minutes. It is normal to feel some stretching, pulling, tightness, or mild discomfort as you begin new exercises. This will gradually improve. Stop an exercise right away if you feel sudden, severe pain or your pain gets worse. Ask your health care provider which exercises are best for you. Knuckle bend or claw fist Stand or sit with your arm, hand, and all five fingers pointed straight up. Make sure to keep your wrist straight during the exercise. Gently bend your fingers down toward your palm until the  tips of your fingers are touching the top of your palm. Keep your big knuckle straight and just bend the small knuckles in your fingers. Hold this position for 3 seconds. Straighten (extend) your fingers back to the starting position. Repeat this exercise 5-10 times with each hand. Full finger fist Stand or sit with your arm, hand, and all five fingers pointed straight up. Make sure to keep your wrist straight during the exercise. Gently bend your fingers into your palm until the tips of your fingers are touching the middle of your palm. Hold this position for 3 seconds. Extend your fingers back to the starting position, stretching every joint fully. Repeat this exercise 5-10 times with each hand. Straight fist Stand or sit with your arm, hand, and all five fingers pointed straight up. Make sure to keep your wrist straight during the exercise. Gently bend your fingers at the big knuckle, where your fingers meet your hand, and the middle knuckle. Keep the knuckle at the tips of your fingers straight and try to touch the bottom of your palm. Hold this position for 3 seconds. Extend your fingers back to the starting position, stretching every joint fully. Repeat this exercise 5-10 times with each hand. Tabletop Stand or sit with your arm, hand, and all five fingers pointed straight up. Make sure to keep your wrist straight during the exercise. Gently bend your fingers at the big knuckle, where your fingers meet your hand, as far down as  you can while keeping the small knuckles in your fingers straight. Think of forming a tabletop with your fingers. Hold this position for 3 seconds. Extend your fingers back to the starting position, stretching every joint fully. Repeat this exercise 5-10 times with each hand. Finger spread Place your hand flat on a table with your palm facing down. Make sure your wrist stays straight as you do this exercise. Spread your fingers and thumb apart from each other as  far as you can until you feel a gentle stretch. Hold this position for 3 seconds. Bring your fingers and thumb tight together again. Hold this position for 3 seconds. Repeat this exercise 5-10 times with each hand. Making circles Stand or sit with your arm, hand, and all five fingers pointed straight up. Make sure to keep your wrist straight during the exercise. Make a circle by touching the tip of your thumb to the tip of your index finger. Hold for 3 seconds. Then open your hand wide. Repeat this motion with your thumb and each finger on your hand. Repeat this exercise 5-10 times with each hand. Thumb motion Sit with your forearm resting on a table and your wrist straight. Your thumb should be facing up toward the ceiling. Keep your fingers relaxed as you move your thumb. Lift your thumb up as high as you can toward the ceiling. Hold for 3 seconds. Bend your thumb across your palm as far as you can, reaching the tip of your thumb for the small finger (pinkie) side of your palm. Hold for 3 seconds. Repeat this exercise 5-10 times with each hand. Grip strengthening  Hold a stress ball or other soft ball in the middle of your hand. Slowly increase the pressure, squeezing the ball as much as you can without causing pain. Think of bringing the tips of your fingers into the middle of your palm. All of your finger joints should bend when doing this exercise. Hold your squeeze for 3 seconds, then relax. Repeat this exercise 5-10 times with each hand. Contact a health care provider if: Your hand pain or discomfort gets much worse when you do an exercise. Your hand pain or discomfort does not improve within 2 hours after you exercise. If you have any of these problems, stop doing these exercises right away. Do not do them again unless your health care provider says that you can. Get help right away if: You develop sudden, severe hand pain or swelling. If this happens, stop doing these exercises  right away. Do not do them again unless your health care provider says that you can. Make sure you discuss any questions you have with your health care provider. Document Revised: 04/01/2019 Document Reviewed: 12/10/2018 Elsevier Patient Education  2020 [employer].

## 2024-08-06 NOTE — Progress Notes (Signed)
 Musculoskeletal Exam  Patient: Zachary [name] DOB: 18-Oct-1951  DOS: 08/06/2024  SUBJECTIVE:  Chief Complaint:   Chief Complaint  Patient presents with   Hand Pain    Left hand - stiffness    Zachary [name] is a 73 y.o.  male for evaluation and treatment of L hand pain.   Onset:  3 weeks ago. No inj or change in activity.  Location: 2nd and 3rd digits; entire hand in AM Character:  aching  Progression of issue:  is unchanged Associated symptoms: inability to move hand in AM Treatment: to date has been rest.   Neurovascular symptoms: no  Hypertension Patient presents for hypertension follow up. He does monitor home blood pressures. Blood pressures ranging on average from 140-160's/80-90's. He was on lisinopril  5 mg/d years ago but has not been in a few years.  He is usually adhering to a healthy diet overall. Exercise: walking No CP. Over past month, has been having SOB intermittently. Sometimes has to stop. sometimes  Past Medical History:  Diagnosis Date   Allergy Seasonal/dust   take Loratadine  10 mg daily   Arthritis    Articular cartilage disease    left shoulder   CAD (coronary artery disease)    Diverticulitis    Dyslipidemia (high LDL; low HDL) 08/15/2016   GERD (gastroesophageal reflux disease)    Hearing problem    hearing deficit   Hemorrhoids    History of back surgery    Hypertension    off meds since 05/25/23 -   Hypothyroidism    Prostate cancer (HCC) 2015   treated and in remission   Sclerosing mesenteritis (HCC)    diagnosed 2022   Sleep apnea    uses a cpap   Stroke (HCC)    TIA -affected balance and went through PT.   Wears glasses    Wears hearing aid    both ears    Objective: VITAL SIGNS: BP (!) 160/80 (BP Location: Right Arm)   Pulse 80   Temp 98 F (36.7 C)   Resp 18   Ht 5' 8 (1.727 m)   Wt 209 lb 9.6 oz (95.1 kg)   SpO2 97%   BMI 31.87 kg/m  Constitutional: Well formed, well developed. No acute distress. Thorax  & Lungs: CTAB. No accessory muscle use Musculoskeletal: L hand.   Tenderness to palpation: yes over PIP of 2nd and 3rd digit on L Deformity: no Ecchymosis: no Neurologic: Normal sensory function.  Psychiatric: Normal mood. Age appropriate judgment and insight. Alert & oriented x 3.    Assessment:  DOE (dyspnea on exertion) - Plan: EKG 12-Lead, CBC, Comprehensive metabolic panel with GFR, Magnesium, T4, free, TSH, DG Chest 2 View, CANCELED: TSH, CANCELED: T4, free, CANCELED: Comprehensive metabolic panel with GFR, CANCELED: CBC, CANCELED: Magnesium  Essential hypertension - Plan: amLODipine  (NORVASC ) 5 MG tablet  Left hand pain  Plan: Ck above. EKG shows NSR, normal axis, no interval abnormalities, no ST segment or T wave changes, good R wave progression. Ck CXR.   Chronic, not controlled.  Start Norvasc  5 mg daily.  Monitor blood pressure at home.  Follow-up in 1 month to recheck. Stretches/exercises, heat, ice, Tylenol .  Could consider OT versus imaging versus referral if no improvement. The patient voiced understanding and agreement to the plan.   Mabel Mt Joyce, DO 08/06/24  1:37 PM

## 2024-08-07 ENCOUNTER — Ambulatory Visit: Payer: Self-pay | Admitting: Family Medicine

## 2024-08-07 LAB — CBC
HCT: 46.4 % (ref 38.5–50.0)
Hemoglobin: 15.1 g/dL (ref 13.2–17.1)
MCH: 30.9 pg (ref 27.0–33.0)
MCHC: 32.5 g/dL (ref 32.0–36.0)
MCV: 94.9 fL (ref 80.0–100.0)
MPV: 10.9 fL (ref 7.5–12.5)
Platelets: 227 Thousand/uL (ref 140–400)
RBC: 4.89 Million/uL (ref 4.20–5.80)
RDW: 13 % (ref 11.0–15.0)
WBC: 6.7 Thousand/uL (ref 3.8–10.8)

## 2024-08-07 LAB — COMPREHENSIVE METABOLIC PANEL WITH GFR
AG Ratio: 2.2 (calc) (ref 1.0–2.5)
ALT: 23 U/L (ref 9–46)
AST: 18 U/L (ref 10–35)
Albumin: 4.6 g/dL (ref 3.6–5.1)
Alkaline phosphatase (APISO): 59 U/L (ref 35–144)
BUN: 20 mg/dL (ref 7–25)
CO2: 27 mmol/L (ref 20–32)
Calcium: 9.7 mg/dL (ref 8.6–10.3)
Chloride: 102 mmol/L (ref 98–110)
Creat: 1.13 mg/dL (ref 0.70–1.28)
Globulin: 2.1 g/dL (ref 1.9–3.7)
Glucose, Bld: 116 mg/dL — ABNORMAL HIGH (ref 65–99)
Potassium: 3.9 mmol/L (ref 3.5–5.3)
Sodium: 139 mmol/L (ref 135–146)
Total Bilirubin: 0.4 mg/dL (ref 0.2–1.2)
Total Protein: 6.7 g/dL (ref 6.1–8.1)
eGFR: 70 mL/min/1.73m2 (ref >=60)

## 2024-08-07 LAB — T4, FREE: Free T4: 1.2 ng/dL (ref 0.8–1.8)

## 2024-08-07 LAB — TSH: TSH: 2.78 m[IU]/L (ref 0.40–4.50)

## 2024-08-07 LAB — MAGNESIUM: Magnesium: 1.9 mg/dL (ref 1.5–2.5)

## 2024-09-04 ENCOUNTER — Other Ambulatory Visit: Payer: Self-pay | Admitting: Family Medicine

## 2024-09-04 DIAGNOSIS — M545 Low back pain, unspecified: Secondary | ICD-10-CM

## 2024-09-07 ENCOUNTER — Ambulatory Visit (INDEPENDENT_AMBULATORY_CARE_PROVIDER_SITE_OTHER)

## 2024-09-07 VITALS — Ht 68.0 in | Wt 209.0 lb

## 2024-09-07 DIAGNOSIS — Z0000 Encounter for general adult medical examination without abnormal findings: Secondary | ICD-10-CM | POA: Diagnosis not present

## 2024-09-07 NOTE — Patient Instructions (Signed)
 Zachary [name],  Thank you for taking the time for your Medicare Wellness Visit. I appreciate your continued commitment to your health goals. Please review the care plan we discussed, and feel free to reach out if I can assist you further.  Medicare recommends these wellness visits once per year to help you and your care team stay ahead of potential health issues. These visits are designed to focus on prevention, allowing your provider to concentrate on managing your acute and chronic conditions during your regular appointments.  Please note that Annual Wellness Visits do not include a physical exam. Some assessments may be limited, especially if the visit was conducted virtually. If needed, we may recommend a separate in-person follow-up with your provider.  Ongoing Care Seeing your primary care provider every 3 to 6 months helps us  monitor your health and provide consistent, personalized care.   Referrals If a referral was made during today's visit and you haven't received any updates within two weeks, please contact the referred provider directly to check on the status.  Recommended Screenings:  Health Maintenance  Topic Date Due   Pneumococcal Vaccine for age over 58 (2 of 2 - PCV) 04/14/2021   Flu Shot  07/23/2024   COVID-19 Vaccine (7 - Pfizer risk 2024-25 season) 08/23/2024   Zoster (Shingles) Vaccine (2 of 2) 10/01/2024   Medicare Annual Wellness Visit  09/07/2025   Colon Cancer Screening  01/15/2032   DTaP/Tdap/Td vaccine (4 - Td or Tdap) 07/12/2034   Hepatitis C Screening  Completed   HPV Vaccine  Aged Out   Meningitis B Vaccine  Aged Out       09/07/2024    8:51 AM  Advanced Directives  Does Patient Have a Medical Advance Directive? Yes  Type of Estate agent of [place];Living will  Does patient want to make changes to medical advance directive? No - Patient declined  Copy of Healthcare Power of Attorney in Chart? Yes - validated most recent copy  scanned in chart (See row information)   Advance Care Planning is important because it: Ensures you receive medical care that aligns with your values, goals, and preferences. Provides guidance to your family and loved ones, reducing the emotional burden of decision-making during critical moments.  Vision: Annual vision screenings are recommended for early detection of glaucoma, cataracts, and diabetic retinopathy. These exams can also reveal signs of chronic conditions such as diabetes and high blood pressure.  Dental: Annual dental screenings help detect early signs of oral cancer, gum disease, and other conditions linked to overall health, including heart disease and diabetes.  Please see the attached documents for additional preventive care recommendations.

## 2024-09-07 NOTE — Progress Notes (Signed)
 Subjective:   Zachary [name] is a 73 y.o. who presents for a Medicare Wellness preventive visit.  As a reminder, Annual Wellness Visits don't include a physical exam, and some assessments may be limited, especially if this visit is performed virtually. We may recommend an in-person follow-up visit with your provider if needed.  Visit Complete: Virtual I connected with  [name] on 09/07/24 by a audio enabled telemedicine application and verified that I am speaking with the correct person using two identifiers.  Patient Location: Home  Provider Location: Home Office  I discussed the limitations of evaluation and management by telemedicine. The patient expressed understanding and agreed to proceed.  Vital Signs: Because this visit was a virtual/telehealth visit, some criteria may be missing or patient reported. Any vitals not documented were not able to be obtained and vitals that have been documented are patient reported.  VideoDeclined- This patient declined Librarian, academic. Therefore the visit was completed with audio only.  Persons Participating in Visit: Patient.  AWV Questionnaire: Yes: Patient Medicare AWV questionnaire was completed by the patient on 08/31/24; I have confirmed that all information answered by patient is correct and no changes since this date.  Cardiac Risk Factors include: advanced age (>52men, >72 women);dyslipidemia;hypertension;male gender     Objective:    Today's Vitals   09/07/24 0810  Weight: 209 lb (94.8 kg)  Height: 5' 8 (1.727 m)   Body mass index is 31.78 kg/m.     09/07/2024    8:51 AM 09/25/2023    4:57 AM 09/11/2023    6:00 PM 09/04/2023    8:18 AM 09/01/2023    8:12 AM 07/31/2023    9:08 AM 07/21/2023    8:52 AM  Advanced Directives  Does Patient Have a Medical Advance Directive? Yes Yes Yes Yes Yes Yes Yes  Type of Estate agent of [place];Living will Living will Living will  Living will Living will Healthcare Power of Attorney;Living will Healthcare Power of [place];Living will  Does patient want to make changes to medical advance directive? No - Patient declined  No - Patient declined No - Patient declined  No - Patient declined   Copy of Healthcare Power of Attorney in Chart? Yes - validated most recent copy scanned in chart (See row information)     No - copy requested     Current Medications (verified) Outpatient Encounter Medications as of 09/07/2024  Medication Sig   amLODipine  (NORVASC ) 5 MG tablet Take 1 tablet (5 mg total) by mouth daily.   aspirin  EC 81 MG EC tablet Take 81 mg by mouth daily.   fluticasone  (FLONASE ) 50 MCG/ACT nasal spray Use 2 spray(s) in each nostril once daily   Hydrocortisone  (PREPARATION H EX) Apply 1 Application topically daily as needed (hemorrhoids).   levothyroxine  (SYNTHROID ) 100 MCG tablet Take 1 tablet by mouth once daily   loratadine  (CLARITIN ) 10 MG tablet Take 10 mg by mouth daily.   omeprazole  (PRILOSEC) 20 MG capsule Take 1 capsule by mouth once daily   pravastatin  (PRAVACHOL ) 80 MG tablet Take 1 tablet by mouth once daily   sodium bicarbonate  650 MG tablet Take 1 tablet by mouth once daily   [DISCONTINUED] melatonin 5 MG TABS Take 5 mg by mouth at bedtime as needed (sleep).   No facility-administered encounter medications on file as of 09/07/2024.    Allergies (verified) Oxycodone , Atorvastatin , Mobic  [meloxicam ], and Simvastatin   History: Past Medical History:  Diagnosis Date  Allergy Seasonal/dust   take Loratadine  10 mg daily   Arthritis    Articular cartilage disease    left shoulder   CAD (coronary artery disease)    Diverticulitis    Dyslipidemia (high LDL; low HDL) 08/15/2016   GERD (gastroesophageal reflux disease)    Hearing problem    hearing deficit   Hemorrhoids    History of back surgery    Hypertension    off meds since 05/25/23 -   Hypothyroidism    Prostate cancer (HCC) 2015    treated and in remission   Sclerosing mesenteritis (HCC)    diagnosed 2022   Sleep apnea    uses a cpap   Stroke (HCC)    TIA -affected balance and went through PT.   Wears glasses    Wears hearing aid    both ears   Past Surgical History:  Procedure Laterality Date   BACK SURGERY  12/23/1976   herniated disksurgery   CARDIAC CATHETERIZATION  08/01/2010   COLONOSCOPY     2007, 2017 and 2023   EXAM UNDER ANESTHESIA WITH MANIPULATION OF SHOULDER Right 09/15/2014   Procedure: RIGHT SHOULDER MANIPULATION UNDER ANESTHESIA;  Surgeon: Toribio JULIANNA Chancy, MD;  Location: [facility];  Service: Orthopedics;  Laterality: Right;   FLEXIBLE SIGMOIDOSCOPY  09/11/2023   Procedure: FLEXIBLE SIGMOIDOSCOPY;  Surgeon: Teresa Lonni HERO, MD;  Location: [facility];  Service: General;;   HEMORRHOID SURGERY  08/23/2006   LEFT HEART CATH AND CORONARY ANGIOGRAPHY N/A 01/16/2021   Procedure: LEFT HEART CATH AND CORONARY ANGIOGRAPHY;  Surgeon: Burnard Debby LABOR, MD;  Location: [facility];  Service: Cardiovascular;  Laterality: N/A;   SHOULDER ARTHROSCOPY WITH DISTAL CLAVICLE RESECTION Left 07/07/2015   Procedure: SHOULDER ARTHROSCOPY WITH DISTAL CLAVICLE RESECTION;  Surgeon: Toribio Chancy, MD;  Location: [facility];  Service: Orthopedics;  Laterality: Left;   SHOULDER ARTHROSCOPY WITH ROTATOR CUFF REPAIR Left 12/07/2015   Procedure: LEFT SHOULDER ARTHROSCOPY DEBRIDEMENT, WITH ROTATOR CUFF REPAIR;  Surgeon: Toribio JULIANNA Chancy, MD;  Location: [facility];  Service: Orthopedics;  Laterality: Left;   SHOULDER ARTHROSCOPY WITH ROTATOR CUFF REPAIR AND SUBACROMIAL DECOMPRESSION Left 07/07/2015   Procedure: LEFT SHOULDER SCOPE DEBRIDEMENT, SUBACROMIAL DECOMPRESSION, DISTAL CLAVICULECTOMY, ROTATOR CUFF REPAIR  ;  Surgeon: Toribio Chancy, MD;  Location: [facility];  Service: Orthopedics;  Laterality: Left;  ANESTHESIA: GENERAL, PRE/POST OP SCALENE   SHOULDER  ARTHROSCOPY WITH SUBACROMIAL DECOMPRESSION, ROTATOR CUFF REPAIR AND BICEP TENDON REPAIR Right 03/10/2014   Procedure: RIGHT SHOULDER ARTHROSCOPY WITH SUBACROMIAL DECOMPRESSION, PARTIAL ACROMIOPLASTY WITH CORACOAROMIAL LABRUM DEBRIDEMENT RELEASE DISTAL CLAVICULECTOMY,  ROTATOR CUFF REPAIR AND EXTENSIVE DEBRIDEMENT;  Surgeon: Toribio JULIANNA Chancy, MD;  Location: [facility];  Service: Orthopedics;  Laterality: Right;   SPINE SURGERY  Disc repaired 12/1976   TENDON REPAIR  06/06/2011   right elbow, Dr. Chancy   XI ROBOTIC ASSISTED LOWER ANTERIOR RESECTION N/A 09/11/2023   Procedure: XI ROBOTIC ASSISTED LOW ANTERIOR RESECTION/ SIGMOIDECTOMY AND INTRAOPERATIVE ASSESSMENT OF PERFUSION USING ICG DYE;  Surgeon: Teresa Lonni HERO, MD;  Location: [facility];  Service: General;  Laterality: N/A;   Family History  Problem Relation Age of Onset   Colon polyps Mother    Breast cancer Mother    Arthritis Mother    Cancer Mother    Hearing loss Mother    Miscarriages / [place] Mother    Varicose Veins Mother    Heart disease Father 47   Alcohol abuse Father  Asthma Father    Hypertension Father    Cancer Brother    Alcohol abuse Other    Arthritis Other    Cancer Other        Breast, Prostate   Coronary artery disease Other    Irritable bowel syndrome Other    Cystic fibrosis Other    Anxiety disorder Son    Colon cancer Neg Hx    Esophageal cancer Neg Hx    Stomach cancer Neg Hx    Rectal cancer Neg Hx    Social History   Socioeconomic History   Marital status: Married    Spouse name: Barnie   Number of children: 2   Years of education: Not on file   Highest education level: 12th grade  Occupational History   Occupation: retired Engineer, manufacturing: [employer]    Comment: Geologist, engineering  Tobacco Use   Smoking status: Never   Smokeless tobacco: Never  Vaping Use   Vaping status: Never Used  Substance and Sexual Activity   Alcohol  use: Yes    Alcohol/week: 1.0 standard drink of alcohol    Types: 1 Glasses of wine per week    Comment: Glass of wine occasionally   Drug use: No   Sexual activity: Not Currently    Comment: Prostate Removal 07/08/2017  Other Topics Concern   Not on file  Social History Narrative   Previously Loss adjuster, [employer]   Social Drivers of Health   Financial Resource Strain: Low Risk  (09/07/2024)   Overall Financial Resource Strain (CARDIA)    Difficulty of Paying Living Expenses: Not hard at all  Food Insecurity: No Food Insecurity (09/07/2024)   Hunger Vital Sign    Worried About Running Out of Food in the Last Year: Never true    Ran Out of Food in the Last Year: Never true  Transportation Needs: No Transportation Needs (09/07/2024)   PRAPARE - Administrator, Civil Service (Medical): No    Lack of Transportation (Non-Medical): No  Physical Activity: Sufficiently Active (09/07/2024)   Exercise Vital Sign    Days of Exercise per Week: 7 days    Minutes of Exercise per Session: 30 min  Stress: No Stress Concern Present (09/07/2024)   [employer] of Occupational Health - Occupational Stress Questionnaire    Feeling of Stress: Only a little  Social Connections: Moderately Integrated (09/07/2024)   Social Connection and Isolation Panel    Frequency of Communication with Friends and Family: More than three times a week    Frequency of Social Gatherings with Friends and Family: Once a week    Attends Religious Services: Never    Database administrator or Organizations: Yes    Attends Engineer, structural: More than 4 times per year    Marital Status: Married    Tobacco Counseling Counseling given: Not Answered    Clinical Intake:  Pre-visit preparation completed: Yes  Pain : No/denies pain  Diabetes: No  Lab Results  Component Value Date   HGBA1C 6.1 (H) 05/24/2023   HGBA1C 5.8 (H) 01/16/2021   HGBA1C 5.9 05/28/2018      How often do you need to have someone help you when you read instructions, pamphlets, or other written materials from your doctor or pharmacy?: 1 - Never  Interpreter Needed?: No  Information entered by :: Charmaine Bloodgood LPN   Activities of Daily Living     08/31/2024  12:33 PM 09/11/2023    6:00 PM  In your present state of health, do you have any difficulty performing the following activities:  Hearing? 0 1  Vision? 0 0  Difficulty concentrating or making decisions? 0 1  Walking or climbing stairs? 0 0  Dressing or bathing? 0 0  Doing errands, shopping? 0 0  Preparing Food and eating ? N   Using the Toilet? N   In the past six months, have you accidently leaked urine? Y   Do you have problems with loss of bowel control? N   Managing your Medications? N   Managing your Finances? N   Housekeeping or managing your Housekeeping? N     Patient Care Team: Frann Mabel Mt, DO as PCP - General (Family Medicine) Shlomo Wilbert SAUNDERS, MD as PCP - Cardiology (Cardiology) Matilda Senior, MD as Consulting Physician (Urology) Julieanne Saddie FALCON, AUD (Audiology)  I have updated your Care Teams any recent Medical Services you may have received from other providers in the past year.     Assessment:   This is a routine wellness examination for Ceferino.  Hearing/Vision screen Hearing Screening - Comments:: Hearing difficulty; followed by audiology; wears hearing aids  Vision Screening - Comments:: Wears rx glasses - up to date with routine eye exams   Goals Addressed             This Visit's Progress    Patient Stated   On track    Drink more water & continue walking       Depression Screen     09/07/2024    8:50 AM 08/06/2024    1:01 PM 09/04/2023    8:26 AM 09/02/2022    8:22 AM 08/29/2021    7:46 AM 08/17/2021    7:08 AM 09/27/2020    9:21 AM  PHQ 2/9 Scores  PHQ - 2 Score 0 0 0 0 0 0 0    Fall Risk     08/31/2024   12:33 PM 08/06/2024    1:01 PM 08/28/2023    9:03  AM 03/28/2023    8:10 AM 09/02/2022    8:22 AM  Fall Risk   Falls in the past year? 0 0 1 0 0  Number falls in past yr: 0 0 0 0 0  Injury with Fall? 0 0 0 0 0  Risk for fall due to : No Fall Risks No Fall Risks History of fall(s)  No Fall Risks  Follow up Falls prevention discussed;Education provided;Falls evaluation completed Falls evaluation completed;Education provided Falls evaluation completed  Falls evaluation completed      Data saved with a previous flowsheet row definition    MEDICARE RISK AT HOME:  Medicare Risk at Home Any stairs in or around the home?: (Patient-Rptd) Yes If so, are there any without handrails?: (Patient-Rptd) No Home free of loose throw rugs in walkways, pet beds, electrical cords, etc?: (Patient-Rptd) Yes Adequate lighting in your home to reduce risk of falls?: (Patient-Rptd) Yes Life alert?: (Patient-Rptd) No Use of a cane, walker or w/c?: (Patient-Rptd) No Grab bars in the bathroom?: (Patient-Rptd) Yes Shower chair or bench in shower?: (Patient-Rptd) No Elevated toilet seat or a handicapped toilet?: (Patient-Rptd) No  TIMED UP AND GO:  Was the test performed?  No  Cognitive Function: 6CIT completed      11/01/2020    2:00 PM  Montreal Cognitive Assessment   Visuospatial/ Executive (0/5) 2  Naming (0/3) 3  Attention: Read list of digits (0/2)  1  Attention: Read list of letters (0/1) 1  Attention: Serial 7 subtraction starting at 100 (0/3) 3  Language: Repeat phrase (0/2) 0  Language : Fluency (0/1) 0  Abstraction (0/2) 0  Delayed Recall (0/5) 3  Orientation (0/6) 6  Total 19  Adjusted Score (based on education) 20      09/07/2024    8:52 AM 09/04/2023    8:27 AM 09/02/2022    8:27 AM  6CIT Screen  What Year? 0 points 0 points 0 points  What month? 0 points 0 points 0 points  What time? 0 points 0 points 0 points  Count back from 20 0 points 0 points 0 points  Months in reverse 0 points 0 points 0 points  Repeat phrase 0 points 0  points 0 points  Total Score 0 points 0 points 0 points    Immunizations Immunization History  Administered Date(s) Administered   Fluad  Quad(high Dose 65+) 09/27/2020, 10/03/2021, 12/13/2022   Fluad  Trivalent(High Dose 65+) 10/14/2023   Influenza Whole 11/06/2010, 10/24/2011, 09/22/2012   Influenza,inj,Quad PF,6+ Mos 09/07/2019   Influenza-Unspecified 09/22/2014, 09/23/2015, 09/17/2016, 09/09/2017   PFIZER Comirnaty (Gray Top)Covid-19 Tri-Sucrose Vaccine 04/05/2021   PFIZER(Purple Top)SARS-COV-2 Vaccination 01/14/2020, 02/03/2020, 09/15/2020   Pfizer Covid-19 Vaccine Bivalent Booster 24yrs & up 09/13/2021   Pfizer(Comirnaty )Fall Seasonal Vaccine 12 years and older 10/14/2023   Pneumococcal Polysaccharide-23 01/14/2019, 04/14/2020   Td 10/17/2008   Tdap 01/07/2013, 07/12/2024   Zoster Recombinant(Shingrix ) 08/06/2024   Zoster, Live 08/08/2015    Screening Tests Health Maintenance  Topic Date Due   Pneumococcal Vaccine: 50+ Years (2 of 2 - PCV) 04/14/2021   Influenza Vaccine  07/23/2024   COVID-19 Vaccine (7 - Pfizer risk 2024-25 season) 08/23/2024   Zoster Vaccines- Shingrix  (2 of 2) 10/01/2024   Medicare Annual Wellness (AWV)  09/07/2025   Colonoscopy  01/15/2032   DTaP/Tdap/Td (4 - Td or Tdap) 07/12/2034   Hepatitis C Screening  Completed   HPV VACCINES  Aged Out   Meningococcal B Vaccine  Aged Out    Health Maintenance Items Addressed: Information provided on vaccine recommendations   Additional Screening:  Vision Screening: Recommended annual ophthalmology exams for early detection of glaucoma and other disorders of the eye. Is the patient up to date with their annual eye exam?  Yes  Who is the provider or what is the name of the office in which the patient attends annual eye exams? [facility]   Dental Screening: Recommended annual dental exams for proper oral hygiene  Community Resource Referral / Chronic Care Management: CRR required this visit?  No   CCM  required this visit?  No   Plan:    I have personally reviewed and noted the following in the patient's chart:   Medical and social history Use of alcohol, tobacco or illicit drugs  Current medications and supplements including opioid prescriptions. Patient is not currently taking opioid prescriptions. Functional ability and status Nutritional status Physical activity Advanced directives List of other physicians Hospitalizations, surgeries, and ER visits in previous 12 months Vitals Screenings to include cognitive, depression, and falls Referrals and appointments  In addition, I have reviewed and discussed with patient certain preventive protocols, quality metrics, and best practice recommendations. A written personalized care plan for preventive services as well as general preventive health recommendations were provided to patient.   Lavelle Pfeiffer [place], [place]   [id number]   After Visit Summary: (MyChart) Due to this being a telephonic visit, the after visit summary with patients personalized  plan was offered to patient via MyChart   Notes: Nothing significant to report at this time.

## 2024-09-11 ENCOUNTER — Other Ambulatory Visit: Payer: Self-pay | Admitting: Family Medicine

## 2024-09-14 ENCOUNTER — Encounter: Payer: Self-pay | Admitting: Family Medicine

## 2024-09-14 ENCOUNTER — Ambulatory Visit: Admitting: Family Medicine

## 2024-09-14 VITALS — BP 138/70 | HR 71 | Temp 98.0°F | Resp 18 | Ht 68.0 in | Wt 211.6 lb

## 2024-09-14 DIAGNOSIS — I10 Essential (primary) hypertension: Secondary | ICD-10-CM | POA: Diagnosis not present

## 2024-09-14 DIAGNOSIS — M25561 Pain in right knee: Secondary | ICD-10-CM | POA: Diagnosis not present

## 2024-09-14 DIAGNOSIS — Z23 Encounter for immunization: Secondary | ICD-10-CM | POA: Diagnosis not present

## 2024-09-14 MED ORDER — METHOCARBAMOL 500 MG PO TABS: 500.0000 mg | ORAL_TABLET | Freq: Three times a day (TID) | ORAL | 0 refills | Status: DC | PRN

## 2024-09-14 MED ORDER — AMLODIPINE BESYLATE 10 MG PO TABS: 10.0000 mg | ORAL_TABLET | Freq: Every day | ORAL | 3 refills | Status: DC

## 2024-09-14 NOTE — Progress Notes (Signed)
 Chief Complaint  Patient presents with   Follow-up    Subjective Zachary [name] is a 73 y.o. male who presents for hypertension follow up. He does monitor home blood pressures. Blood pressures ranging from 130-140's/80's on average. He is compliant with medication- Norvasc  5 mg/d. Patient has these side effects of medication: none He is usually adhering to a healthy diet overall. Current exercise: some walking No CP or SOB.   The patient had his right knee gave out a couple weeks ago.  He caught himself on his [employer] Rx.  He has had swelling and inside knee pain since then.  It has steadily improved since that time.  No catching/locking or instability.  Hurts when walking.  No neurologic signs or symptoms.   Past Medical History:  Diagnosis Date   Allergy Seasonal/dust   take Loratadine  10 mg daily   Arthritis    Articular cartilage disease    left shoulder   CAD (coronary artery disease)    Diverticulitis    Dyslipidemia (high LDL; low HDL) 08/15/2016   GERD (gastroesophageal reflux disease)    Hearing problem    hearing deficit   Hemorrhoids    History of back surgery    Hypertension    off meds since 05/25/23 -   Hypothyroidism    Prostate cancer (HCC) 2015   treated and in remission   Sclerosing mesenteritis (HCC)    diagnosed 2022   Sleep apnea    uses a cpap   Stroke (HCC)    TIA -affected balance and went through PT.   Wears glasses    Wears hearing aid    both ears    Exam BP 138/70   Pulse 71   Temp 98 F (36.7 C)   Resp 18   Ht 5' 8 (1.727 m)   Wt 211 lb 9.6 oz (96 kg)   SpO2 98%   BMI 32.17 kg/m  General:  well developed, well nourished, in no apparent distress Heart: RRR, no bruits, no LE edema Lungs: clear to auscultation, no accessory muscle use MSK: Slight soft tissue edema medially.  No effusion.  Normal active/passive range of motion.  No bony tenderness.  There is tenderness over the medial plica.  Negative Lachman's, varus/valgus  stress, Stines, patellar apprehension/grind. Neuro: Gait is antalgic Psych: well oriented with normal range of affect and appropriate judgment/insight  Essential hypertension  Acute pain of right knee  Need for pneumococcal vaccination - Plan: Pneumococcal conjugate vaccine 20-valent (Prevnar 20)  Chronic, not controlled.  Increase Norvasc  from 5 mg daily to 10 mg daily.  Monitor blood pressure at home.  Let me know if it does not come down in the next month or so.  Counseled on diet and exercise. Stretches and exercises, ice, Tylenol .  Suspect inflammation of the knee plica. PCV 20 today. F/u in 6 months for med check. The patient voiced understanding and agreement to the plan.  Mabel Mt [place], DO 09/14/24  7:58 AM

## 2024-09-14 NOTE — Patient Instructions (Signed)
 Keep the diet clean and stay active.  Ice/cold pack over area for 10-15 min twice daily.  OK to take Tylenol  1000 mg (2 extra strength tabs) or 975 mg (3 regular strength tabs) every 6 hours as needed.  Let us  know if you need anything.  Stretching and range of motion exercises These exercises warm up your muscles and joints and improve the movement and flexibility of your knee. These exercises also help to relieve pain and stiffness.  Exercise A: Knee flexion, active Lie on your back with both knees straight. If this causes back discomfort, bend your uninjured knee so your foot is flat on the floor. Slowly slide your left / right heel back toward your buttocks until you feel a gentle stretch in the front of your knee or thigh. Stop if you have pain. Hold for3 seconds. Slowly slide your left / right heel back to the starting position. 10 total repetitions. Repeat 2 times. Complete this exercise 3 times a week.  Exercise B: Knee extension, sitting Sit with your left / right heel propped on a chair, a coffee table, or a footstool. Do not have anything under your knee to support it. Allow your leg muscles to relax, letting gravity straighten out your knee. You should feel a stretch behind your left / right knee. If told by your health care provide just above your kneecap. Hold this position for 3 seconds. Repeat for a total of 10 repetitions. Repeat 2 times. Complete this stretch 3 times a week.  Strengthening exercises These exercises build strength and endurance in your knee. Endurance is the ability to use your muscles for a long time, even after they get tired.  Exercise C: Quadriceps, isometric Lie on your back with your left / right leg extended and your other knee bent. Put a rolled towel or small pillow under your right/left knee if told by your health care provider. Slowly tense the muscles in the front of your left / right thigh by pushing the back of your knee down. You should  see your knee cap slide up toward your hip or see increased dimpling just above the knee. For 3 seconds, keep the muscle as tight as you can without increasing your pain. Relax the muscles slowly and completely. Repeat for 10 total repetitions. Repeat 2 times. Complete this exercise 3 times a week. Exercise D: Straight leg raises (quadriceps) Lie on your back with your left / right leg extended and your other knee bent. Tense the muscles in the front of your left / right thigh. You should see your kneecap slide up or see increased dimpling just above the knee. Keep these muscles tight as you raise your leg 4-6 inches (10-15 cm) off the floor. Hold this position for 3 seconds. Keep these muscles tense as you lower your leg. Relax the muscles slowly and completely. Repeat for a total of 10 repetitions. Repeat 2 times. Complete this exercise 3 times a week.  Exercise E: Hamstring curls On the floor or a bed, lie on your abdomen with your legs straight. Put a folded towel or small pillow under your left / right thigh, just above your kneecap. Slowly bend your left / right knee as far as you can without pain. Keep your hips flat against the floor or bed. Hold this position for 3 seconds. Slowly lower your leg to the starting position. Repeat for a total of 10 repetitions. Repeat 2 times. Complete this exercise 3 times per week.  Stretching  exercises These exercises warm up your muscles and joints and improve the movement and flexibility of your knee. These exercises also help to relieve pain and stiffness.  Exercise A: Quadriceps, prone Lie on your abdomen on a firm surface, such as a bed or padded floor. Bend your left / right knee and hold your ankle. If you cannot reach your ankle or pant leg, loop a belt around your foot and grab the belt instead. Gently pull your heel toward your buttocks. Your knee should not slide out to the side. You should feel a stretch in the front of your thigh and  knee. Hold this position for 30 seconds. Repeat 2 times. Complete this stretch 3 times a week.  Exercise B: Hamstring, doorway Lie on your back in front of a doorway with your left / right leg resting against the wall and your other leg flat on the floor in the doorway. There should be a slight bend in your left / right knee. Straighten your left / right knee. You should feel a stretch behind your knee or thigh. If you do not feel that stretch, scoot your buttocks closer to the door. Hold this position for 30 seconds. Repeat 2 times. Complete this stretch 3 times a week.  Strengthening exercises These exercises build strength and endurance in your knee and leg muscles. Endurance is the ability to use your muscles for a long time, even after they get tired.   Exercise D: Wall slides (quadriceps) Lean your back against a smooth wall or door, and walk your feet out 18-24 inches (45-61 cm) from it. Place your feet hip-width apart. Slowly slide down the wall or door until your knees bend 90 degrees. Keep your knees over your heels, not over your toes. Keep your knees in line with your hips. Hold for 2 seconds. Stand up to rest for 60 seconds. Repeat 2 times. Complete this exercise 3 times a week.  Exercise E: Bridge (hip extensors) Lie on your back on a firm surface with your knees bent and your feet flat on the floor. Tighten your buttocks muscles and lift your bottom off the floor until your trunk is level with your thighs. Do not arch your back. You should feel the muscles working in your buttocks and the back of your thighs. Hold this position for 2 seconds. Slowly lower your hips to the starting position. Let your buttocks muscles relax completely between repetitions. Repeat 2 times. Complete this exercise 3 times a week.

## 2024-09-20 ENCOUNTER — Other Ambulatory Visit: Payer: Self-pay | Admitting: Family Medicine

## 2024-10-04 ENCOUNTER — Other Ambulatory Visit: Payer: Self-pay | Admitting: Family Medicine

## 2024-10-04 ENCOUNTER — Ambulatory Visit (INDEPENDENT_AMBULATORY_CARE_PROVIDER_SITE_OTHER): Admitting: Family Medicine

## 2024-10-04 ENCOUNTER — Other Ambulatory Visit (HOSPITAL_BASED_OUTPATIENT_CLINIC_OR_DEPARTMENT_OTHER): Payer: Self-pay

## 2024-10-04 ENCOUNTER — Encounter: Payer: Self-pay | Admitting: Family Medicine

## 2024-10-04 VITALS — BP 128/82 | HR 64 | Temp 98.0°F | Resp 16 | Ht 68.0 in | Wt 212.6 lb

## 2024-10-04 DIAGNOSIS — Z23 Encounter for immunization: Secondary | ICD-10-CM | POA: Diagnosis not present

## 2024-10-04 DIAGNOSIS — I10 Essential (primary) hypertension: Secondary | ICD-10-CM

## 2024-10-04 MED ORDER — FLUZONE HIGH-DOSE 0.5 ML IM SUSY
0.5000 mL | PREFILLED_SYRINGE | Freq: Once | INTRAMUSCULAR | 0 refills | Status: AC
  Filled 2024-10-04: qty 0.5, 1d supply, fill #0

## 2024-10-04 MED ORDER — COMIRNATY 30 MCG/0.3ML IM SUSY
0.3000 mL | PREFILLED_SYRINGE | Freq: Once | INTRAMUSCULAR | 0 refills | Status: AC
  Filled 2024-10-04: qty 0.3, 1d supply, fill #0

## 2024-10-04 MED ORDER — OLMESARTAN MEDOXOMIL 20 MG PO TABS: 20.0000 mg | ORAL_TABLET | Freq: Every day | ORAL | 2 refills | Status: DC

## 2024-10-04 NOTE — Patient Instructions (Signed)
 Give us  2-3 business days to get the results of your labs back.   Keep the diet clean and stay active.  Let us  know if you need anything.

## 2024-10-04 NOTE — Progress Notes (Signed)
 Chief Complaint  Patient presents with   Ankle Pain    Ankle pain and Swelling    Subjective Zachary [name] is a 73 y.o. male who presents for hypertension follow up. Here w his wife who helps w the hx.  He does monitor home blood pressures. Blood pressures ranging from 120-130's/70's on average. He is compliant with medication- Was on Norvasc  10 mg/d. Patient has these side effects of medication: swelling in ankles He is usually adhering to a healthy diet overall. Current exercise: walking, active with house/yard projects No CP or SOB.    Past Medical History:  Diagnosis Date   Allergy Seasonal/dust   take Loratadine  10 mg daily   Arthritis    Articular cartilage disease    left shoulder   CAD (coronary artery disease)    Diverticulitis    Dyslipidemia (high LDL; low HDL) 08/15/2016   GERD (gastroesophageal reflux disease)    Hearing problem    hearing deficit   Hemorrhoids    History of back surgery    Hypertension    off meds since 05/25/23 -   Hypothyroidism    Prostate cancer (HCC) 2015   treated and in remission   Sclerosing mesenteritis (HCC)    diagnosed 2022   Sleep apnea    uses a cpap   Stroke (HCC)    TIA -affected balance and went through PT.   Wears glasses    Wears hearing aid    both ears    Exam BP 128/82 (BP Location: Left Arm, Patient Position: Sitting)   Pulse 64   Temp 98 F (36.7 C) (Oral)   Resp 16   Ht 5' 8 (1.727 m)   Wt 212 lb 9.6 oz (96.4 kg)   SpO2 96%   BMI 32.33 kg/m  General:  well developed, well nourished, in no apparent distress Heart: RRR, no bruits, 1+ pitting b/l LE edema tapering at mid tibia.  Lungs: clear to auscultation, no accessory muscle use Psych: well oriented with normal range of affect and appropriate judgment/insight  Essential hypertension - Plan: Basic metabolic panel with GFR, olmesartan (BENICAR) 20 MG tablet  Adverse effect of chronic medication.  Stop amlodipine , did not tolerate lisinopril  due  to cough.  Will start Benicar 20 mg daily.  Monitor blood pressure at home.  Let me know if there are issues.  Counseled on diet and exercise. F/u as originally scheduled.  I do want him to come back for labs in 1 week. The patient voiced understanding and agreement to the plan.  Mabel Mt [place], DO 10/04/24  10:52 AM

## 2024-10-09 ENCOUNTER — Other Ambulatory Visit: Payer: Self-pay | Admitting: Family Medicine

## 2024-10-09 DIAGNOSIS — K219 Gastro-esophageal reflux disease without esophagitis: Secondary | ICD-10-CM

## 2024-10-11 ENCOUNTER — Ambulatory Visit: Payer: Self-pay | Admitting: Family Medicine

## 2024-10-11 ENCOUNTER — Other Ambulatory Visit (INDEPENDENT_AMBULATORY_CARE_PROVIDER_SITE_OTHER)

## 2024-10-11 DIAGNOSIS — I10 Essential (primary) hypertension: Secondary | ICD-10-CM

## 2024-10-11 LAB — BASIC METABOLIC PANEL WITH GFR
BUN: 20 mg/dL (ref 6–23)
CO2: 25 meq/L (ref 19–32)
Calcium: 9.8 mg/dL (ref 8.4–10.5)
Chloride: 102 meq/L (ref 96–112)
Creatinine, Ser: 1.05 mg/dL (ref 0.40–1.50)
GFR: 71.74 mL/min (ref >60.00)
Glucose, Bld: 129 mg/dL — ABNORMAL HIGH (ref 70–99)
Potassium: 4.5 meq/L (ref 3.5–5.1)
Sodium: 138 meq/L (ref 135–145)

## 2024-10-20 ENCOUNTER — Other Ambulatory Visit (HOSPITAL_BASED_OUTPATIENT_CLINIC_OR_DEPARTMENT_OTHER): Payer: Self-pay

## 2024-10-23 ENCOUNTER — Encounter: Payer: Self-pay | Admitting: Family Medicine

## 2024-10-25 ENCOUNTER — Other Ambulatory Visit: Payer: Self-pay

## 2024-10-25 DIAGNOSIS — I10 Essential (primary) hypertension: Secondary | ICD-10-CM

## 2024-10-25 MED ORDER — OLMESARTAN MEDOXOMIL 20 MG PO TABS: 20.0000 mg | ORAL_TABLET | Freq: Every day | ORAL | 2 refills | Status: AC

## 2024-11-09 ENCOUNTER — Encounter: Payer: Self-pay | Admitting: Family Medicine

## 2024-11-09 ENCOUNTER — Ambulatory Visit (INDEPENDENT_AMBULATORY_CARE_PROVIDER_SITE_OTHER): Admitting: Family Medicine

## 2024-11-09 VITALS — BP 128/70 | HR 93 | Temp 98.0°F | Resp 16 | Ht 68.0 in | Wt 217.0 lb

## 2024-11-09 DIAGNOSIS — L918 Other hypertrophic disorders of the skin: Secondary | ICD-10-CM

## 2024-11-09 DIAGNOSIS — M79642 Pain in left hand: Secondary | ICD-10-CM | POA: Diagnosis not present

## 2024-11-09 DIAGNOSIS — R519 Headache, unspecified: Secondary | ICD-10-CM | POA: Diagnosis not present

## 2024-11-09 MED ORDER — HYDROCORTISONE 2.5 % EX CREA
TOPICAL_CREAM | Freq: Two times a day (BID) | CUTANEOUS | 0 refills | Status: AC
Start: 2024-11-09 — End: ?

## 2024-11-09 NOTE — Progress Notes (Signed)
 Musculoskeletal Exam  Patient: Zachary [name] [facility] DOB: [birth year]  DOS: 11/09/2024  SUBJECTIVE:  Chief Complaint:   Chief Complaint  Patient presents with   Hand Pain    Left Finger Pain    Zachary [name] is a 73 y.o.  male for evaluation and treatment of L index finger pain.   Onset:  1 week ago.  Location: inside of finger/thumb  Character:  burning and sharp  Progression of issue:  unchanged Associated symptoms: decreased grip strength 2/2 pain, decreased ROM No bruising, redness, swelling. Treatment: to date has been acetaminophen , topical menthol.   Neurovascular symptoms: no  Patient has had 4 days of inflamed skin tags.  They are located in his perennial region.  They are red.  No swelling, spreading redness, fevers, trauma, itching, or drainage.  A few weeks ago the patient started having mild headaches.  He normally does not get them.  2 weeks ago he had a very severe headache while eating lunch.  It passed relatively quickly.  His wife was there and make sure he was not having any signs of a stroke.  He has not had any issues since then.  No vision changes, difficulty swallowing, trouble with speech, decreased range of motion, pain in the neck, increased stress, balance issues, weakness, jaw pain, dental pain, numbness, tingling, coordination difficulties.  Past Medical History:  Diagnosis Date   Allergy Seasonal/dust   take Loratadine  10 mg daily   Arthritis    Articular cartilage disease    left shoulder   CAD (coronary artery disease)    Diverticulitis    Dyslipidemia (high LDL; low HDL) 08/15/2016   GERD (gastroesophageal reflux disease)    Hearing problem    hearing deficit   Hemorrhoids    History of back surgery    Hypertension    off meds since 05/25/23 -   Hypothyroidism    Prostate cancer (HCC) 2015   treated and in remission   Sclerosing mesenteritis (HCC)    diagnosed 2022   Sleep apnea    uses a cpap   Stroke (HCC)    TIA -affected  balance and went through PT.   Wears glasses    Wears hearing aid    both ears    Objective: VITAL SIGNS: BP 128/70 (BP Location: Left Arm, Patient Position: Sitting)   Pulse 93   Temp 98 F (36.7 C) (Oral)   Resp 16   Ht 5' 8 (1.727 m)   Wt 217 lb (98.4 kg)   SpO2 99%   BMI 32.99 kg/m  Constitutional: Well formed, well developed. No acute distress. Thorax & Lungs: No accessory muscle use Heart: Brisk capillary refill Musculoskeletal: Left hand.   Normal active range of motion: Decreased second MCP flexion/extension.   Normal passive range of motion: no, as above Tenderness to palpation: Yes over the lateral portion of the index finger and medial portion of the thumb Deformity: no Ecchymosis: no Tests positive: None Tests negative: Finkelstein's Skin: Erythematous and tender acrochordons noted in the perennial region without surrounding erythema, edema, ecchymosis, purulence. Neurologic: Normal sensory function. No focal deficits noted. DTR's equal and symmetric throughout. No clonus. CN II-XII intact.  Sensation intact to light touch over the hands. Psychiatric: Normal mood. Age appropriate judgment and insight. Alert & oriented x 3.    Assessment:  Left hand pain  Acute nonintractable headache, unspecified headache type  Skin tags, multiple acquired  Plan: He will buddy tape his index finger to his middle finger  and consider a thumb spica splint for his thumb.  Send message if not improving and we will get him in with the sports medicine team.  Stretches/exercises, heat, ice, Tylenol .  His neuroexam is unremarkable.  He has not had any issues since then.  Doubt bleed.  Less likely to be anything sinister right now.  If it happens again, I would like to see him sooner. Inflamed skin tags.  Offered removal.  Will send in cortisone cream to help symptomatic management.  If not improving in the next few days, he will return and we will take them off. F/u as originally  scheduled. The patient voiced understanding and agreement to the plan.  I spent 30 minutes with the patient discussing the above plans in addition to reviewing his chart on the same day of the visit.  Mabel Mt [place], DO 11/09/24  8:13 AM

## 2024-11-09 NOTE — Patient Instructions (Signed)
 Ice/cold pack over area for 10-15 min twice daily.  Heat (pad or rice pillow in microwave) over affected area, 10-15 minutes twice daily.   Continue the stretches/exercise for your hand that you can tolerate.   Consider buddy-taping your index and middle finger and using a thumb spica splint.   OK to take Tylenol  1000 mg (2 extra strength tabs) or 975 mg (3 regular strength tabs) every 6 hours as needed.  Let us  know if you need anything.

## 2024-11-22 ENCOUNTER — Encounter: Payer: Self-pay | Admitting: Family Medicine

## 2024-11-22 ENCOUNTER — Ambulatory Visit: Admitting: Family Medicine

## 2024-11-22 VITALS — BP 126/78 | HR 62 | Temp 98.0°F | Resp 16 | Ht 68.0 in | Wt 219.8 lb

## 2024-11-22 DIAGNOSIS — M79642 Pain in left hand: Secondary | ICD-10-CM

## 2024-11-22 MED ORDER — METHOCARBAMOL 500 MG PO TABS: 500.0000 mg | ORAL_TABLET | Freq: Three times a day (TID) | ORAL | 0 refills | Status: AC | PRN

## 2024-11-22 MED ORDER — PREDNISONE 20 MG PO TABS: 40.0000 mg | ORAL_TABLET | Freq: Every day | ORAL | 0 refills | Status: AC

## 2024-11-22 NOTE — Progress Notes (Signed)
 Chief Complaint  Patient presents with   Follow-up    Follow Up    Subjective: Patient is a 73 y.o. male here for follow-up left hand pain.  Patient was seen on 11/09/2024 for the same issue.  It seems to have gotten worse.  He has been using Tylenol  with minimal relief.  Denies swelling, redness, bruising, neurologic signs or symptoms.  No recent injury or change in activity.  Pain is located over the knuckle of his left index finger, now with some radiation into his left thumb.  He has been using a thumb spica splint as well.  He was given stretches and exercises which he has not been able to do as they exacerbate his symptoms.  Past Medical History:  Diagnosis Date   Allergy Seasonal/dust   take Loratadine  10 mg daily   Arthritis    Articular cartilage disease    left shoulder   CAD (coronary artery disease)    Diverticulitis    Dyslipidemia (high LDL; low HDL) 08/15/2016   GERD (gastroesophageal reflux disease)    Hearing problem    hearing deficit   Hemorrhoids    History of back surgery    Hypertension    off meds since 05/25/23 -   Hypothyroidism    Prostate cancer (HCC) 2015   treated and in remission   Sclerosing mesenteritis (HCC)    diagnosed 2022   Sleep apnea    uses a cpap   Stroke (HCC)    TIA -affected balance and went through PT.   Wears glasses    Wears hearing aid    both ears    Objective: BP 126/78 (BP Location: Left Arm, Patient Position: Sitting)   Pulse 62   Temp 98 F (36.7 C) (Oral)   Resp 16   Ht 5' 8 (1.727 m)   Wt 219 lb 12.8 oz (99.7 kg)   SpO2 98%   BMI 33.42 kg/m  General: Awake, appears stated age MSK: TTP over the second MCP on the left.  No obvious deformity, excessive warmth, erythema, ecchymosis, or edema. Neuro: Sensation intact to light touch distal to the MCP. Lungs: No accessory muscle use Psych: Age appropriate judgment and insight, normal affect and mood  Assessment and Plan: Left hand pain - Plan: predniSONE   (DELTASONE ) 20 MG tablet, Ambulatory referral to Sports Medicine  5-day prednisone  burst 40 mg daily.  Heat, ice, Tylenol , stretches/exercises as able.  Refer to sports medicine for their opinion. The patient voiced understanding and agreement to the plan.  Mabel Mt [place], DO 11/22/24  7:38 AM

## 2024-11-22 NOTE — Patient Instructions (Signed)
 If you do not hear anything about your referral in the next 1-2 weeks, call our office and ask for an update.  OK to take Tylenol  1000 mg (2 extra strength tabs) or 975 mg (3 regular strength tabs) every 6 hours as needed.  Ice/cold pack over area for 10-15 min twice daily.  Heat (pad or rice pillow in microwave) over affected area, 10-15 minutes twice daily.   Continue the exercises as able.   Let us  know if you need anything.

## 2024-11-23 ENCOUNTER — Ambulatory Visit

## 2024-11-23 ENCOUNTER — Other Ambulatory Visit: Payer: Self-pay

## 2024-11-23 VITALS — BP 104/60 | Ht 68.0 in | Wt 219.0 lb

## 2024-11-23 DIAGNOSIS — M25842 Other specified joint disorders, left hand: Secondary | ICD-10-CM | POA: Diagnosis not present

## 2024-11-23 DIAGNOSIS — M65322 Trigger finger, left index finger: Secondary | ICD-10-CM | POA: Diagnosis not present

## 2024-11-23 DIAGNOSIS — M79642 Pain in left hand: Secondary | ICD-10-CM

## 2024-11-23 MED ORDER — METHYLPREDNISOLONE ACETATE 40 MG/ML IJ SUSP
40.0000 mg | Freq: Once | INTRAMUSCULAR | Status: AC
  Administered 2024-11-23: 20 mg via INTRA_ARTICULAR

## 2024-11-23 NOTE — Progress Notes (Addendum)
 Subjective:    Patient ID: Zachary [name], male    DOB: 73 y.o., March 05, 1951   MRN: [id number]  Chief Complaint: Left index finger MCP joint pain  Discussed the use of AI scribe software for clinical note transcription with the patient, who gave verbal consent to proceed.  History of Present Illness 73 y/o male with past medical history of hypothyroidism, hypertension, OSA presenting with left index finger MCP joint pain.  He was evaluated by his primary care physician, Dr. Frann, on 11/18 and 12/1.  Initially treated with thumb spica splint and home exercises. upon reevaluation, treated with 5-day prednisone  burst.  Patient reports that he is right-hand-dominant and has been increasing the volume of woodcarving that he has not done since May of this year.  Hot, cold compresses, Tylenol  have provided minimal relief.  Unable to do home exercise program due to the pain.  Hand pain and digital triggering - Hand pain and finger locking present for over one month - Initial symptoms began in one finger and progressed to involve additional fingers - Pain localized at the metacarpophalangeal joints with proximal radiation - Pain severity described as severe, exacerbated by making a fist, and can occur at rest - Morning finger locking and difficulty achieving a full fist - No prior surgery on the affected hand  Response to conservative therapies - Trialed cold and hot therapy, including ice and hot pad, without relief - Used a brace for two weeks and attempted finger taping - Performed home exercises, but these were too painful to continue - No improvement with any conservative measures  Occupational and activity-related factors - 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     Objective:   Vitals:   11/23/24 0803  BP: 104/60    Left Hand/Fingers/Wrist ( compared to normal ) -Inspection: Mild swelling present over the second MCP joint of the left hand with difficulty touching the tips of the fingers to the palmar crease. -Palpation: TTP - DIP, - PIP, + MCP (2nd, palmar side),  - metacarpals, - scaphoid/snuff box, - scaphoid tubercle -AROM/PROM: Full flexion, extension, supination, pronation of the wrist. Full flexion and extension of the fingers, with and without isolation of the DIP. -Strength: 5/5 flexion, 5/5 extension (radial), 5/5 pronation, 5/5 supination, 5/5 finger abduction (ulnar), 5/5 Ok sign (median) -sensation: intact on dorsum of hand (radial), 4th/5th digits (ulnar), 1st-3rd digits (median) -Special tests: - Finklestein, - Eichoff, - Tinel at wrist, - Durkin compression test, - CMC grind, - TFCC grind, - fovea sign   Limited ultrasound evaluation of the left hand: Second MCP is well-visualized and does not appear to have significant increased hypoechogenic fluid signal present within the joint. The flexor pollicis longus  is well-visualized and appears to have scant interstitial fibers separation when viewed in short axis. Hyperechogenic band noted to pass superficially and at the distalmost aspect of the transverse head of the adductor pollicis muscle traversing across the first webbed space. Moderate increased Doppler flow around the second MCP joint. Interpretation Trigger finger of the second digit of left hand. Sesamoiditis at second MCP of left hand Band of scar tissue present within first interdigital space adjacent the transverse head of the adductor pollicis.  Trigger Finger Injection Procedure Note [place] [facility] 1951/06/04 Indications: Pain Procedure Details Verbal consent was obtained. Risks (including potential risk  for skin lightening and potential atrophy), benefits and alternatives were discussed. The left 2nd A1 pulley of  the flexor digitorum tendons was identified. Prepped with Chloraprep and Ethyl Chloride used for anesthesia. Under sterile conditions, patient injected with 20mg  Depo-Medrol , 0.5 cc of Mepivacaine 2%, and 0.25 cc of Sodium Bicarbonate  8.4% at the A1 pulley as visualized using the palmar crease aiming distally with 45 degree angle towards nodule; injected directly into tendon sheath. Medication flowed freely without resistance.    Assessment & Plan:   Assessment & Plan Left hand flexor tenosynovitis Early triggering of 2nd digit of left hand Sesamoiditis of 2nd digit of left hand Raheen's hand pain is surprisingly complex in nature based on my evaluation and ultrasound today.  He has a taut band of tissue in the distalmost aspect of the first webspace which on ultrasonographic examination appears fairly consistent with scar tissue but follows fairly closely the pathway of opponens pollicis.  He also appears to have early triggering at the A1 pulley of his second digit along with a sesamoiditis on the ulnar aspect of the second MCP adjacent the A1 pulley.  His greatest area of tenderness seems to be directly over the A1 pulley and the flexor tendon and thus I proceeded with a trigger finger injection for him today.  I recommend following up in 2 weeks to reassess, and we can potentially target some of these other areas of pain for him to provide relief.  Recommend referral to hand therapy if pain persists.

## 2024-12-07 NOTE — Progress Notes (Unsigned)
° °  Subjective:    Patient ID: Zachary [name], male    DOB: 73 y.o., 03-06-1951   MRN: [id number]  Chief Complaint: Left hand flexor tenosynovitis, second digit left hand trigger finger, sesamoiditis of second digit , Discussed the use of AI scribe software for clinical note transcription with the patient, who gave verbal consent to proceed.  History of Present Illness Patient evaluated by myself on 11/23/2024 and found to have a taut band of tissue in the distalmost aspect of the first webspace which normal sonographic examination appears fairly consistent with scar tissue which fairly closely follows the pathway of the transverse head of the opponens pollicis muscle.  He also seem to have focal tenderness over a sesamoid in his index finger, and triggering of that index finger as well.  He was provided with a trigger finger injection and recommended to follow-up.  Right index finger triggering and locking - Persistent triggering and locking of the right index finger, worst in the morning - Requires effort to extend the finger - Locking persists with a popping sensation when extending from a fist - Pain has improved by approximately 80-90% since the last visit - Locking remains most severe in the index finger  Other right hand finger symptoms - Milder intermittent locking and stiffness in other right hand fingers - Symptoms less pronounced than in the index finger  Prior treatments and interventions - Received corticosteroid injection with substantial pain relief - Continues to use a brace - Follows home exercise regimen - Attends twice-weekly exercise classes including finger and full-body exercises  Recent hand injuries - Recent right thumb laceration from carving - Several minor woodworking cuts since May - No ongoing symptoms from these injuries     Objective:   Vitals:   12/08/24 0758  BP: 120/80    Const: appears well, non-toxic, well groomed Psych: affect bright,  interactive, smiling EENT: EOMI intact, conjunctiva appear normal Neck: no obvious masses, appears symmetric Resp: non-labored, appears symmetric Neuro: muscle bulk appears normal Skin: no obvious rashes noted   Left hand: There is palpable triggering present at the A1 pulley of the second digit of the left hand. There is also a tendinous prominence which slides but does not quite trigger at the A1 pulley and the 3rd and 4th digits of the left hand.  These are minimally tender to palpation.   The MCP joint on the palmar side of the second digit of the left hand is minimally tender to palpation now (significant improvement compared to prior).    Assessment & Plan:   Assessment & Plan Trigger finger The index finger continues to trigger, especially in the morning, but is 80-90% improved in terms of pain. The finger locks and requires effort to straighten. Other fingers are beginning to show similar symptoms, though less severe. The condition is improving but not fully resolved. A corticosteroid injection was previously administered, which significantly helped despite causing discomfort during administration. - Refer to hand therapy for strengthening exercises and potential dry needling. - Advise to continue wearing the brace as needed. - Instruct to perform home exercises as provided by Dr. Royce. - Instruct to continue attending [facility] classes [facility] the [facility] center. - Advise to message the office two weeks after the first hand therapy appointment to update on progress.

## 2024-12-08 ENCOUNTER — Ambulatory Visit

## 2024-12-08 VITALS — BP 120/80 | Ht 68.0 in | Wt 219.0 lb

## 2024-12-08 DIAGNOSIS — M79642 Pain in left hand: Secondary | ICD-10-CM | POA: Diagnosis present

## 2024-12-09 DIAGNOSIS — M25842 Other specified joint disorders, left hand: Secondary | ICD-10-CM

## 2024-12-09 DIAGNOSIS — M65322 Trigger finger, left index finger: Secondary | ICD-10-CM

## 2024-12-09 DIAGNOSIS — M79642 Pain in left hand: Secondary | ICD-10-CM

## 2024-12-11 ENCOUNTER — Other Ambulatory Visit: Payer: Self-pay | Admitting: Family Medicine

## 2024-12-18 ENCOUNTER — Other Ambulatory Visit: Payer: Self-pay | Admitting: Family Medicine

## 2024-12-25 ENCOUNTER — Other Ambulatory Visit: Payer: Self-pay | Admitting: Family Medicine

## 2024-12-30 NOTE — Therapy (Signed)
 " OUTPATIENT OCCUPATIONAL THERAPY ORTHO EVALUATION  Patient Name: Zachary [name] MRN: [id number] DOB:[birth year], 74 y.o., male Today's Date: 12/31/2024  PCP: Mabel Dan REFERRING PROVIDER: Charles Redell LABOR, DO  END OF SESSION:  OT End of Session - 12/31/24 0947     Visit Number 1    Number of Visits 17    Date for Recertification  03/01/25    Authorization Type Medicare, [employer]    Authorization - Number of Visits 1    Progress Note Due on Visit 10    OT Start Time 0803    OT Stop Time 0845    OT Time Calculation (min) 42 min    Activity Tolerance Patient tolerated treatment well    Behavior During Therapy [facility] for tasks assessed/performed          Past Medical History:  Diagnosis Date   Allergy Seasonal/dust   take Loratadine  10 mg daily   Arthritis    Articular cartilage disease    left shoulder   CAD (coronary artery disease)    Diverticulitis    Dyslipidemia (high LDL; low HDL) 08/15/2016   GERD (gastroesophageal reflux disease)    Hearing problem    hearing deficit   Hemorrhoids    History of back surgery    Hypertension    off meds since 05/25/23 -   Hypothyroidism    Prostate cancer (HCC) 2015   treated and in remission   Sclerosing mesenteritis (HCC)    diagnosed 2022   Sleep apnea    uses a cpap   Stroke (HCC)    TIA -affected balance and went through PT.   Wears glasses    Wears hearing aid    both ears   Past Surgical History:  Procedure Laterality Date   BACK SURGERY  12/23/1976   herniated disksurgery   CARDIAC CATHETERIZATION  08/01/2010   COLONOSCOPY     2007, 2017 and 2023   EXAM UNDER ANESTHESIA WITH MANIPULATION OF SHOULDER Right 09/15/2014   Procedure: RIGHT SHOULDER MANIPULATION UNDER ANESTHESIA;  Surgeon: Toribio JULIANNA Chancy, MD;  Location: [facility];  Service: Orthopedics;  Laterality: Right;   FLEXIBLE SIGMOIDOSCOPY  09/11/2023   Procedure: FLEXIBLE SIGMOIDOSCOPY;  Surgeon: Teresa Lonni HERO, MD;  Location:  [facility];  Service: General;;   HEMORRHOID SURGERY  08/23/2006   LEFT HEART CATH AND CORONARY ANGIOGRAPHY N/A 01/16/2021   Procedure: LEFT HEART CATH AND CORONARY ANGIOGRAPHY;  Surgeon: Burnard Debby LABOR, MD;  Location: [facility];  Service: Cardiovascular;  Laterality: N/A;   SHOULDER ARTHROSCOPY WITH DISTAL CLAVICLE RESECTION Left 07/07/2015   Procedure: SHOULDER ARTHROSCOPY WITH DISTAL CLAVICLE RESECTION;  Surgeon: Toribio Chancy, MD;  Location: [facility];  Service: Orthopedics;  Laterality: Left;   SHOULDER ARTHROSCOPY WITH ROTATOR CUFF REPAIR Left 12/07/2015   Procedure: LEFT SHOULDER ARTHROSCOPY DEBRIDEMENT, WITH ROTATOR CUFF REPAIR;  Surgeon: Toribio JULIANNA Chancy, MD;  Location: [facility];  Service: Orthopedics;  Laterality: Left;   SHOULDER ARTHROSCOPY WITH ROTATOR CUFF REPAIR AND SUBACROMIAL DECOMPRESSION Left 07/07/2015   Procedure: LEFT SHOULDER SCOPE DEBRIDEMENT, SUBACROMIAL DECOMPRESSION, DISTAL CLAVICULECTOMY, ROTATOR CUFF REPAIR  ;  Surgeon: Toribio Chancy, MD;  Location: [facility];  Service: Orthopedics;  Laterality: Left;  ANESTHESIA: GENERAL, PRE/POST OP SCALENE   SHOULDER ARTHROSCOPY WITH SUBACROMIAL DECOMPRESSION, ROTATOR CUFF REPAIR AND BICEP TENDON REPAIR Right 03/10/2014   Procedure: RIGHT SHOULDER ARTHROSCOPY WITH SUBACROMIAL DECOMPRESSION, PARTIAL ACROMIOPLASTY WITH CORACOAROMIAL LABRUM DEBRIDEMENT RELEASE DISTAL CLAVICULECTOMY,  ROTATOR CUFF REPAIR  AND EXTENSIVE DEBRIDEMENT;  Surgeon: Toribio JULIANNA Chancy, MD;  Location: [facility];  Service: Orthopedics;  Laterality: Right;   SPINE SURGERY  Disc repaired 12/1976   TENDON REPAIR  06/06/2011   right elbow, Dr. Chancy   XI ROBOTIC ASSISTED LOWER ANTERIOR RESECTION N/A 09/11/2023   Procedure: XI ROBOTIC ASSISTED LOW ANTERIOR RESECTION/ SIGMOIDECTOMY AND INTRAOPERATIVE ASSESSMENT OF PERFUSION USING ICG DYE;  Surgeon: Teresa Lonni HERO, MD;  Location: [facility];  Service:  General;  Laterality: N/A;   Patient Active Problem List   Diagnosis Date Noted   S/P laparoscopic-assisted sigmoidectomy 09/11/2023   Cough 05/24/2023   Acute diverticulitis 05/23/2023   Hearing aid worn 02/28/2021   Coronary artery disease involving autologous artery coronary bypass graft with angina pectoris 01/22/2021   Chest pain 01/15/2021   Ingrowing nail, left great toe 07/12/2020   Radiation cystitis 04/09/2019   Bladder tumor 03/31/2019   Contracture of left Achilles tendon 10/26/2018   Plantar fasciitis of left foot 08/25/2018   Prediabetes 05/27/2018   Stomatitis 04/17/2017   Dyslipidemia (high LDL; low HDL) 08/15/2016   S/P arthroscopy of shoulder 05/31/2016   History of prostate cancer 05/09/2016   Complete rotator cuff tear of left shoulder 07/05/2015   Tennis elbow 05/30/2014   Rotator cuff (capsule) sprain 03/10/2014   Right shoulder pain 02/11/2014   Memory difficulty 12/18/2012   Gynecomastia, male 07/30/2012   OSA (obstructive sleep apnea) 04/23/2011   External hemorrhoids 02/12/2011   Prostate cancer (HCC) 11/12/2010   PLANTAR FASCIITIS 04/09/2010   Hypothyroidism 12/08/2009   NEOPLASM OF UNCERTAIN BEHAVIOR OF SKIN 03/10/2009   Osteoarthritis 02/14/2009   Mixed hyperlipidemia 02/08/2009   Hearing loss 02/08/2009   Essential hypertension 02/08/2009   Allergic rhinitis 02/08/2009   GERD without esophagitis 02/08/2009   PROTEINURIA 02/08/2009   DIVERTICULITIS, HX OF 02/08/2009    ONSET DATE: approx 1 month ago  REFERRING DIAG: M79.642 (ICD-10-CM) - Left hand pain M25.842 (ICD-10-CM) - Sesamoiditis of left hand M65.322 (ICD-10-CM) - Trigger index finger of left hand  THERAPY DIAG:  Stiffness of right hand, not elsewhere classified  Pain in joint of right hand  Rationale for Evaluation and Treatment: Rehabilitation  SUBJECTIVE:   SUBJECTIVE STATEMENT: Pt reports that he does hours of wood carving/wood working a day.  Pain and  triggering/locking worse in the morning.  Pt accompanied by: self  PERTINENT HISTORY: had injection 11/23/24 which helped.  Pain and L 2nd digit locking particularly in the morning, but some throughout the day.  Other fingers also beginning symptoms but very mild.    PMH:  prostate CA 2015, HLD, GERD, hearing aids, HTN, hypothyroidism, cardiac cath, R shoulder RTC and biceps repair, R shoulder surgical manip, L shoulder RTC repair, R elbow tendon repair, sleep apnea  PRECAUTIONS: None  RED FLAGS: None   WEIGHT BEARING RESTRICTIONS: No  PAIN:  Are you having pain? Yes: NPRS scale: 3-7/10 Pain location: L hand volar 2nd digit  Pain description: sharp Aggravating factors: gripping  Relieving factors: rest   FALLS: Has patient fallen in last 6 months? 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  HAND DOMINANCE: Right  ADLs: Overall ADLs:  independent  Pt reports pain with gripping and opening items.  FUNCTIONAL OUTCOME MEASURES: Quick Dash: 15.9%  UPPER EXTREMITY ROM:  L hand ROM grossly WNL with min pain reported with full finger flexion   UPPER EXTREMITY MMT:   NT  HAND FUNCTION: Grip strength: Right: 42 lbs; Left: 44 lbs and Tip pinch: Right 12 lbs, Left: 10 lbs (4/10 pain with grip and 5/10 with pinch).    COORDINATION: WNL  SENSATION: WFL per pt report  COGNITION: Overall cognitive status: Within functional limits for tasks assessed  OBSERVATIONS: noted tightness in palm at base of 2nd digit   TREATMENT DATE:   01/01/24:  Evaluation Completed.                                                                                                                                PATIENT EDUCATION: Education details: OT eval results and POC; initial HEP--see pt instructions; recommended to take frequent breaks when wood carving, avoid sustained finger flex at night if able, and isolate joint flex vs. Composite to decr triggering Person educated: Patient Education method: Explanation, Demonstration, Verbal cues, and Handouts Education comprehension: verbalized understanding, returned demonstration, and verbal cues required  HOME EXERCISE PROGRAM: 01/01/24:  initial ROM HEP  GOALS: Goals reviewed with patient? Yes  SHORT TERM GOALS: Target date: 02/01/25  Pt will be independent with initial HEP. Goal status: INITIAL  2.  Pt will be independent with splint wear/care. Goal status: INITIAL  3.  Pt will verbalize understanding of activity modifications to decr pain/symptoms. Goal status: INITIAL  4.  Pt will report pain consistently less than or equal to 5/10 with ADLs/IADLs. Goal status: INITIAL   LONG TERM GOALS: Target date: 03/01/25  Pt will be independent with updated HEP. Goal status: INITIAL  2.  Pt will improve L hand functional use/pain as shown by improving Quick Dash to 8% or less. Baseline: 15.9% Goal status: INITIAL  3.  Pt will report L hand pain consistently less than 3/10 for ADLs/IADLs. Goal status: INITIAL   ASSESSMENT:  CLINICAL IMPRESSION: Patient is a 74 y.o. male who was seen today for occupational therapy evaluation for L hand pain and 2nd digit trigger finger.  Pt with PMH that includes:   prostate CA 2015, HLD, GERD, hearing aids, HTN, hypothyroidism, cardiac cath, R shoulder RTC and biceps repair, R shoulder surgical manip, L shoulder RTC repair, R elbow tendon repair, sleep apnea.  Pt reports that he began having symptoms/pain approx 1-2 months ago with gradual onset.  Pt reports difficulty with gripping tasks and in the morning.  Pt would  benefit from occupational therapy to improve L hand functional use and pain for ADLs/IADLs and decr risk of future complications.  PERFORMANCE DEFICITS: in functional skills including ADLs, IADLs, ROM, strength, pain, decreased knowledge of precautions, decreased knowledge of use of DME, and UE functional use, cognitive skills including  and psychosocial skills including habits.   IMPAIRMENTS:  are limiting patient from ADLs, IADLs, rest and sleep, play, and leisure.   COMORBIDITIES: may have co-morbidities  that affects occupational performance. Patient will benefit from skilled OT to address above impairments and improve overall function.  MODIFICATION OR ASSISTANCE TO COMPLETE EVALUATION: No modification of tasks or assist necessary to complete an evaluation.  OT OCCUPATIONAL PROFILE AND HISTORY: Detailed assessment: Review of records and additional review of physical, cognitive, psychosocial history related to current functional performance.  CLINICAL DECISION MAKING: LOW - limited treatment options, no task modification necessary  REHAB POTENTIAL: Good  EVALUATION COMPLEXITY: Low      PLAN:  OT FREQUENCY: 2x/week  OT DURATION: 8 weeks +eval  PLANNED INTERVENTIONS: 97535 self care/ADL training, 02889 therapeutic exercise, 97530 therapeutic activity, 97112 neuromuscular re-education, 97140 manual therapy, 97035 ultrasound, 97018 paraffin, 02989 moist heat, 97010 cryotherapy, 97033 iontophoresis, 97760 Orthotic Initial, and 97763 Orthotic/Prosthetic subsequent  RECOMMENDED OTHER SERVICES: none at this time  CONSULTED AND AGREED WITH PLAN OF CARE: Patient  PLAN FOR NEXT SESSION: ultrasound, fabricate extension night splint, review HEP prn   Lane Eland, OTR/L 12/31/2024, 9:51 AM   "

## 2024-12-31 ENCOUNTER — Ambulatory Visit: Admitting: Occupational Therapy

## 2024-12-31 ENCOUNTER — Encounter: Payer: Self-pay | Admitting: Occupational Therapy

## 2024-12-31 DIAGNOSIS — M25842 Other specified joint disorders, left hand: Secondary | ICD-10-CM | POA: Diagnosis not present

## 2024-12-31 DIAGNOSIS — M25541 Pain in joints of right hand: Secondary | ICD-10-CM | POA: Insufficient documentation

## 2024-12-31 DIAGNOSIS — M79642 Pain in left hand: Secondary | ICD-10-CM | POA: Diagnosis not present

## 2024-12-31 DIAGNOSIS — M65322 Trigger finger, left index finger: Secondary | ICD-10-CM | POA: Insufficient documentation

## 2024-12-31 DIAGNOSIS — M25641 Stiffness of right hand, not elsewhere classified: Secondary | ICD-10-CM | POA: Diagnosis present

## 2024-12-31 NOTE — Patient Instructions (Addendum)
 Repeat #1-3 15 times (in order)  Flexor Tendon Gliding (Active Hook Fist)   With fingers and knuckles straight, bend middle and tip joints. Do not bend large knuckles. Repeat 15 times. Do 3 sessions per day.   Flexor Tendon Gliding (Active Full Fist)   Straighten all fingers, then make a fist, bending all joints. Repeat 15 times. Do 3 sessions per day.   Flexor Tendon Gliding (Active Straight Fist)   Start with fingers straight. Bend knuckles and middle joints. Keep fingertip joints straight to touch base of palm. Repeat 15 times. Do 3 sessions per day.   MP Flexion (Active Isolated)   Bend ALL fingers at large knuckle, keeping other fingers straight. Do not bend tips. Repeat 15 times. Do 3 sessions per day.   PIP Flexion (Active Blocked)    Hold large knuckle straight using other hand. Bend middle joint of index finger as far as possible. Hold 5 seconds. Repeat 15 times. Do _3 sessions per day. Activity: Curl fingers around a jar cap.*

## 2025-01-03 ENCOUNTER — Ambulatory Visit: Admitting: Occupational Therapy

## 2025-01-03 DIAGNOSIS — M25641 Stiffness of right hand, not elsewhere classified: Secondary | ICD-10-CM | POA: Diagnosis not present

## 2025-01-03 DIAGNOSIS — M25541 Pain in joints of right hand: Secondary | ICD-10-CM

## 2025-01-03 NOTE — Patient Instructions (Signed)
 Your Splint This splint should initially be fitted by a healthcare practitioner.  The healthcare practitioner is responsible for providing wearing instructions and precautions to the patient, other healthcare practitioners and care provider involved in the patient's care.  This splint was custom made for you. Please read the following instructions to learn about wearing and caring for your splint.  Precautions Should your splint cause any of the following problems, remove the splint immediately and contact your therapist/physician. Swelling Severe Pain Pressure Areas Stiffness Numbness  Do not wear your splint while operating machinery unless it has been fabricated for that purpose.  When To Wear Your Splint Where your splint according to your therapist/physician instructions. Nights and rest periods only  Care and Cleaning of Your Splint Keep your splint away from open flames. Your splint will lose its shape in temperatures over 135 degrees Farenheit, ( in car windows, near radiators, ovens or in hot water).  Never make any adjustments to your splint, if the splint needs adjusting remove it and make an appointment to see your therapist. Your splint, including the cushion liner may be cleaned with soap and lukewarm water.  Do not immerse in hot water over 135 degrees Farenheit. Straps may be washed with soap and water, but do not moisten the self-adhesive portion.

## 2025-01-04 NOTE — Therapy (Signed)
 " OUTPATIENT OCCUPATIONAL THERAPY ORTHO EVALUATION  Patient Name: Zachary [name] MRN: [id number] DOB:11-09-1951, 74 y.o., male Today's Date: 01/04/2025  PCP: Mabel Dan REFERRING PROVIDER: Charles Redell LABOR, DO  END OF SESSION:  OT End of Session - 01/04/25 0805     Visit Number 2    Number of Visits 17    Date for Recertification  03/01/25    Authorization Type Medicare, [employer]    Authorization - Number of Visits 2    Progress Note Due on Visit 10    OT Start Time 1317    OT Stop Time 1400    OT Time Calculation (min) 43 min          Past Medical History:  Diagnosis Date   Allergy Seasonal/dust   take Loratadine  10 mg daily   Arthritis    Articular cartilage disease    left shoulder   CAD (coronary artery disease)    Diverticulitis    Dyslipidemia (high LDL; low HDL) 08/15/2016   GERD (gastroesophageal reflux disease)    Hearing problem    hearing deficit   Hemorrhoids    History of back surgery    Hypertension    off meds since 05/25/23 -   Hypothyroidism    Prostate cancer (HCC) 2015   treated and in remission   Sclerosing mesenteritis (HCC)    diagnosed 2022   Sleep apnea    uses a cpap   Stroke (HCC)    TIA -affected balance and went through PT.   Wears glasses    Wears hearing aid    both ears   Past Surgical History:  Procedure Laterality Date   BACK SURGERY  12/23/1976   herniated disksurgery   CARDIAC CATHETERIZATION  08/01/2010   COLONOSCOPY     2007, 2017 and 2023   EXAM UNDER ANESTHESIA WITH MANIPULATION OF SHOULDER Right 09/15/2014   Procedure: RIGHT SHOULDER MANIPULATION UNDER ANESTHESIA;  Surgeon: Toribio JULIANNA Chancy, MD;  Location: [facility];  Service: Orthopedics;  Laterality: Right;   FLEXIBLE SIGMOIDOSCOPY  09/11/2023   Procedure: FLEXIBLE SIGMOIDOSCOPY;  Surgeon: Teresa Lonni HERO, MD;  Location: [facility];  Service: General;;   HEMORRHOID SURGERY  08/23/2006   LEFT HEART CATH AND CORONARY ANGIOGRAPHY N/A  01/16/2021   Procedure: LEFT HEART CATH AND CORONARY ANGIOGRAPHY;  Surgeon: Burnard Debby LABOR, MD;  Location: [facility];  Service: Cardiovascular;  Laterality: N/A;   SHOULDER ARTHROSCOPY WITH DISTAL CLAVICLE RESECTION Left 07/07/2015   Procedure: SHOULDER ARTHROSCOPY WITH DISTAL CLAVICLE RESECTION;  Surgeon: Toribio Chancy, MD;  Location: [facility];  Service: Orthopedics;  Laterality: Left;   SHOULDER ARTHROSCOPY WITH ROTATOR CUFF REPAIR Left 12/07/2015   Procedure: LEFT SHOULDER ARTHROSCOPY DEBRIDEMENT, WITH ROTATOR CUFF REPAIR;  Surgeon: Toribio JULIANNA Chancy, MD;  Location: [facility];  Service: Orthopedics;  Laterality: Left;   SHOULDER ARTHROSCOPY WITH ROTATOR CUFF REPAIR AND SUBACROMIAL DECOMPRESSION Left 07/07/2015   Procedure: LEFT SHOULDER SCOPE DEBRIDEMENT, SUBACROMIAL DECOMPRESSION, DISTAL CLAVICULECTOMY, ROTATOR CUFF REPAIR  ;  Surgeon: Toribio Chancy, MD;  Location: [facility];  Service: Orthopedics;  Laterality: Left;  ANESTHESIA: GENERAL, PRE/POST OP SCALENE   SHOULDER ARTHROSCOPY WITH SUBACROMIAL DECOMPRESSION, ROTATOR CUFF REPAIR AND BICEP TENDON REPAIR Right 03/10/2014   Procedure: RIGHT SHOULDER ARTHROSCOPY WITH SUBACROMIAL DECOMPRESSION, PARTIAL ACROMIOPLASTY WITH CORACOAROMIAL LABRUM DEBRIDEMENT RELEASE DISTAL CLAVICULECTOMY,  ROTATOR CUFF REPAIR AND EXTENSIVE DEBRIDEMENT;  Surgeon: Toribio JULIANNA Chancy, MD;  Location: [facility];  Service: Orthopedics;  Laterality: Right;   SPINE SURGERY  Disc repaired 12/1976   TENDON REPAIR  06/06/2011   right elbow, Dr. Beverley   XI ROBOTIC ASSISTED LOWER ANTERIOR RESECTION N/A 09/11/2023   Procedure: XI ROBOTIC ASSISTED LOW ANTERIOR RESECTION/ SIGMOIDECTOMY AND INTRAOPERATIVE ASSESSMENT OF PERFUSION USING ICG DYE;  Surgeon: Teresa Lonni HERO, MD;  Location: [facility];  Service: General;  Laterality: N/A;   Patient Active Problem List   Diagnosis Date Noted   S/P laparoscopic-assisted  sigmoidectomy 09/11/2023   Cough 05/24/2023   Acute diverticulitis 05/23/2023   Hearing aid worn 02/28/2021   Coronary artery disease involving autologous artery coronary bypass graft with angina pectoris 01/22/2021   Chest pain 01/15/2021   Ingrowing nail, left great toe 07/12/2020   Radiation cystitis 04/09/2019   Bladder tumor 03/31/2019   Contracture of left Achilles tendon 10/26/2018   Plantar fasciitis of left foot 08/25/2018   Prediabetes 05/27/2018   Stomatitis 04/17/2017   Dyslipidemia (high LDL; low HDL) 08/15/2016   S/P arthroscopy of shoulder 05/31/2016   History of prostate cancer 05/09/2016   Complete rotator cuff tear of left shoulder 07/05/2015   Tennis elbow 05/30/2014   Rotator cuff (capsule) sprain 03/10/2014   Right shoulder pain 02/11/2014   Memory difficulty 12/18/2012   Gynecomastia, male 07/30/2012   OSA (obstructive sleep apnea) 04/23/2011   External hemorrhoids 02/12/2011   Prostate cancer (HCC) 11/12/2010   PLANTAR FASCIITIS 04/09/2010   Hypothyroidism 12/08/2009   NEOPLASM OF UNCERTAIN BEHAVIOR OF SKIN 03/10/2009   Osteoarthritis 02/14/2009   Mixed hyperlipidemia 02/08/2009   Hearing loss 02/08/2009   Essential hypertension 02/08/2009   Allergic rhinitis 02/08/2009   GERD without esophagitis 02/08/2009   PROTEINURIA 02/08/2009   DIVERTICULITIS, HX OF 02/08/2009    ONSET DATE: approx 1 month ago  REFERRING DIAG: M79.642 (ICD-10-CM) - Left hand pain M25.842 (ICD-10-CM) - Sesamoiditis of left hand M65.322 (ICD-10-CM) - Trigger index finger of left hand  THERAPY DIAG:  Stiffness of right hand, not elsewhere classified  Pain in joint of right hand  Rationale for Evaluation and Treatment: Rehabilitation  SUBJECTIVE:   SUBJECTIVE STATEMENT: Pt reports he wakes up with tightness/ triggering   Pt accompanied by: self  PERTINENT HISTORY: had injection 11/23/24 which helped.  Pain and L 2nd digit locking particularly in the morning, but some  throughout the day.  Other fingers also beginning symptoms but very mild.    PMH:  prostate CA 2015, HLD, GERD, hearing aids, HTN, hypothyroidism, cardiac cath, R shoulder RTC and biceps repair, R shoulder surgical manip, L shoulder RTC repair, R elbow tendon repair, sleep apnea  PRECAUTIONS: None  RED FLAGS: None   WEIGHT BEARING RESTRICTIONS: No  PAIN:  Are you having pain? Yes: NPRS scale: 3-7/10 Pain location: L hand volar 2nd digit  Pain description: sharp Aggravating factors: gripping  Relieving factors: rest   FALLS: Has patient fallen in last 6 months? 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  HAND DOMINANCE: Right  ADLs: Overall ADLs: independent  Pt reports pain with gripping and opening items.  FUNCTIONAL OUTCOME MEASURES: Quick Dash: 15.9%  UPPER EXTREMITY ROM:  L hand ROM grossly WNL with min pain  reported with full finger flexion   UPPER EXTREMITY MMT:   NT  HAND FUNCTION: Grip strength: Right: 42 lbs; Left: 44 lbs and Tip pinch: Right 12 lbs, Left: 10 lbs (4/10 pain with grip and 5/10 with pinch).    COORDINATION: WNL  SENSATION: WFL per pt report  COGNITION: Overall cognitive status: Within functional limits for tasks assessed  OBSERVATIONS: noted tightness in palm at base of 2nd digit   TREATMENT DATE:  01/03/25- Therapist fabricated a night time extension splint for LUE, digits 2-5, wrist and thumb free. (MP's extended slight IP flexion. Pt was educated in splint wear, care and precautions. US  3.3 mhz, 0.8 w/cm 2, 20% x 8 mins to L palm and digits for tightness,  inflammation. No adverse reactions.  Reviewed previously issued HEP for gentle composite flexion, IP flexion with MP extension. 01/01/24:  Evaluation Completed.                                                                                                                               PATIENT EDUCATION: Education details: night time extension splint wear, care and precautions. Person educated: Patient Education method: Explanation, Demonstration, Verbal cues, and Handouts Education comprehension: verbalized understanding, returned demonstration, and verbal cues required  HOME EXERCISE PROGRAM: 01/01/24:  initial ROM HEP  GOALS: Goals reviewed with patient? Yes  SHORT TERM GOALS: Target date: 02/01/25  Pt will be independent with initial HEP. Goal status: ongoing 01/03/25  2.  Pt will be independent with splint wear/care. Goal status: ongoing, night time splint issued 01/03/25  3.  Pt will verbalize understanding of activity modifications to decr pain/symptoms. Goal status: INITIAL  4.  Pt will report pain consistently less than or equal to 5/10 with ADLs/IADLs. Goal status: INITIAL   LONG TERM GOALS: Target date: 03/01/25  Pt will be independent with updated HEP. Goal status: INITIAL  2.  Pt will improve L hand functional use/pain as shown by improving Quick Dash to 8% or less. Baseline: 15.9% Goal status: INITIAL  3.  Pt will report L hand pain consistently less than 3/10 for ADLs/IADLs. Goal status: INITIAL   ASSESSMENT:  CLINICAL IMPRESSION: Patient is progressing towards goals. He demonstrates understanding of night time splint wear, care and precuations. PERFORMANCE DEFICITS: in functional skills including ADLs, IADLs, ROM, strength, pain, decreased knowledge of precautions, decreased knowledge of use of DME, and UE functional use, cognitive skills including  and psychosocial skills including habits.   IMPAIRMENTS: are limiting patient from ADLs, IADLs, rest and sleep,  play, and leisure.   COMORBIDITIES: may have co-morbidities  that affects occupational performance. Patient will benefit from skilled OT to address above impairments and improve overall function.  MODIFICATION OR ASSISTANCE TO COMPLETE EVALUATION: No modification of tasks or assist necessary to complete an evaluation.  OT OCCUPATIONAL PROFILE AND HISTORY: Detailed assessment: Review of records and additional review of physical, cognitive, psychosocial history related to current functional performance.  CLINICAL DECISION  MAKING: LOW - limited treatment options, no task modification necessary  REHAB POTENTIAL: Good  EVALUATION COMPLEXITY: Low      PLAN:  OT FREQUENCY: 2x/week  OT DURATION: 8 weeks +eval  PLANNED INTERVENTIONS: 97535 self care/ADL training, 02889 therapeutic exercise, 97530 therapeutic activity, 97112 neuromuscular re-education, 97140 manual therapy, 97035 ultrasound, 97018 paraffin, 02989 moist heat, 97010 cryotherapy, 97033 iontophoresis, 97760 Orthotic Initial, and 97763 Orthotic/Prosthetic subsequent  RECOMMENDED OTHER SERVICES: none at this time  CONSULTED AND AGREED WITH PLAN OF CARE: Patient  PLAN FOR NEXT SESSION: splint check and modifications, US    Hanah Moultry, OTR/L 01/04/2025, 8:05 AM   "

## 2025-01-05 ENCOUNTER — Ambulatory Visit: Admitting: Occupational Therapy

## 2025-01-05 ENCOUNTER — Encounter: Payer: Self-pay | Admitting: Occupational Therapy

## 2025-01-05 DIAGNOSIS — M25641 Stiffness of right hand, not elsewhere classified: Secondary | ICD-10-CM | POA: Diagnosis not present

## 2025-01-05 DIAGNOSIS — M25541 Pain in joints of right hand: Secondary | ICD-10-CM

## 2025-01-05 NOTE — Therapy (Signed)
 " OUTPATIENT OCCUPATIONAL THERAPY ORTHO EVALUATION  Patient Name: Zachary [name] MRN: [id number] DOB:1951/11/23, 74 y.o., male Today's Date: 01/05/2025  PCP: Mabel Dan REFERRING PROVIDER: Charles Redell LABOR, DO  END OF SESSION:  OT End of Session - 01/05/25 1605     Visit Number 3    Number of Visits 17    Date for Recertification  03/01/25    Authorization Type Medicare, [employer]    Authorization - Number of Visits 3    Progress Note Due on Visit 10    OT Start Time 1405    OT Stop Time 1445    OT Time Calculation (min) 40 min    Activity Tolerance Patient tolerated treatment well    Behavior During Therapy WFL for tasks assessed/performed          Past Medical History:  Diagnosis Date   Allergy Seasonal/dust   take Loratadine  10 mg daily   Arthritis    Articular cartilage disease    left shoulder   CAD (coronary artery disease)    Diverticulitis    Dyslipidemia (high LDL; low HDL) 08/15/2016   GERD (gastroesophageal reflux disease)    Hearing problem    hearing deficit   Hemorrhoids    History of back surgery    Hypertension    off meds since 05/25/23 -   Hypothyroidism    Prostate cancer (HCC) 2015   treated and in remission   Sclerosing mesenteritis (HCC)    diagnosed 2022   Sleep apnea    uses a cpap   Stroke (HCC)    TIA -affected balance and went through PT.   Wears glasses    Wears hearing aid    both ears   Past Surgical History:  Procedure Laterality Date   BACK SURGERY  12/23/1976   herniated disksurgery   CARDIAC CATHETERIZATION  08/01/2010   COLONOSCOPY     2007, 2017 and 2023   EXAM UNDER ANESTHESIA WITH MANIPULATION OF SHOULDER Right 09/15/2014   Procedure: RIGHT SHOULDER MANIPULATION UNDER ANESTHESIA;  Surgeon: Toribio JULIANNA Chancy, MD;  Location: [facility];  Service: Orthopedics;  Laterality: Right;   FLEXIBLE SIGMOIDOSCOPY  09/11/2023   Procedure: FLEXIBLE SIGMOIDOSCOPY;  Surgeon: Teresa Lonni HERO, MD;  Location:  [facility];  Service: General;;   HEMORRHOID SURGERY  08/23/2006   LEFT HEART CATH AND CORONARY ANGIOGRAPHY N/A 01/16/2021   Procedure: LEFT HEART CATH AND CORONARY ANGIOGRAPHY;  Surgeon: Burnard Debby LABOR, MD;  Location: [facility];  Service: Cardiovascular;  Laterality: N/A;   SHOULDER ARTHROSCOPY WITH DISTAL CLAVICLE RESECTION Left 07/07/2015   Procedure: SHOULDER ARTHROSCOPY WITH DISTAL CLAVICLE RESECTION;  Surgeon: Toribio Chancy, MD;  Location: [facility];  Service: Orthopedics;  Laterality: Left;   SHOULDER ARTHROSCOPY WITH ROTATOR CUFF REPAIR Left 12/07/2015   Procedure: LEFT SHOULDER ARTHROSCOPY DEBRIDEMENT, WITH ROTATOR CUFF REPAIR;  Surgeon: Toribio JULIANNA Chancy, MD;  Location: [facility];  Service: Orthopedics;  Laterality: Left;   SHOULDER ARTHROSCOPY WITH ROTATOR CUFF REPAIR AND SUBACROMIAL DECOMPRESSION Left 07/07/2015   Procedure: LEFT SHOULDER SCOPE DEBRIDEMENT, SUBACROMIAL DECOMPRESSION, DISTAL CLAVICULECTOMY, ROTATOR CUFF REPAIR  ;  Surgeon: Toribio Chancy, MD;  [facility];  Service: Orthopedics;  Laterality: Left;  ANESTHESIA: GENERAL, PRE/POST OP SCALENE   SHOULDER ARTHROSCOPY WITH SUBACROMIAL DECOMPRESSION, ROTATOR CUFF REPAIR AND BICEP TENDON REPAIR Right 03/10/2014   Procedure: RIGHT SHOULDER ARTHROSCOPY WITH SUBACROMIAL DECOMPRESSION, PARTIAL ACROMIOPLASTY WITH CORACOAROMIAL LABRUM DEBRIDEMENT RELEASE DISTAL CLAVICULECTOMY,  ROTATOR CUFF REPAIR  AND EXTENSIVE DEBRIDEMENT;  Surgeon: Toribio JULIANNA Chancy, MD;  Location: [facility];  Service: Orthopedics;  Laterality: Right;   SPINE SURGERY  Disc repaired 12/1976   TENDON REPAIR  06/06/2011   right elbow, Dr. Chancy   XI ROBOTIC ASSISTED LOWER ANTERIOR RESECTION N/A 09/11/2023   Procedure: XI ROBOTIC ASSISTED LOW ANTERIOR RESECTION/ SIGMOIDECTOMY AND INTRAOPERATIVE ASSESSMENT OF PERFUSION USING ICG DYE;  Surgeon: Teresa Lonni HERO, MD;  Location: [facility];  Service:  General;  Laterality: N/A;   Patient Active Problem List   Diagnosis Date Noted   S/P laparoscopic-assisted sigmoidectomy 09/11/2023   Cough 05/24/2023   Acute diverticulitis 05/23/2023   Hearing aid worn 02/28/2021   Coronary artery disease involving autologous artery coronary bypass graft with angina pectoris 01/22/2021   Chest pain 01/15/2021   Ingrowing nail, left great toe 07/12/2020   Radiation cystitis 04/09/2019   Bladder tumor 03/31/2019   Contracture of left Achilles tendon 10/26/2018   Plantar fasciitis of left foot 08/25/2018   Prediabetes 05/27/2018   Stomatitis 04/17/2017   Dyslipidemia (high LDL; low HDL) 08/15/2016   S/P arthroscopy of shoulder 05/31/2016   History of prostate cancer 05/09/2016   Complete rotator cuff tear of left shoulder 07/05/2015   Tennis elbow 05/30/2014   Rotator cuff (capsule) sprain 03/10/2014   Right shoulder pain 02/11/2014   Memory difficulty 12/18/2012   Gynecomastia, male 07/30/2012   OSA (obstructive sleep apnea) 04/23/2011   External hemorrhoids 02/12/2011   Prostate cancer (HCC) 11/12/2010   PLANTAR FASCIITIS 04/09/2010   Hypothyroidism 12/08/2009   NEOPLASM OF UNCERTAIN BEHAVIOR OF SKIN 03/10/2009   Osteoarthritis 02/14/2009   Mixed hyperlipidemia 02/08/2009   Hearing loss 02/08/2009   Essential hypertension 02/08/2009   Allergic rhinitis 02/08/2009   GERD without esophagitis 02/08/2009   PROTEINURIA 02/08/2009   DIVERTICULITIS, HX OF 02/08/2009    ONSET DATE: approx 1 month ago  REFERRING DIAG: M79.642 (ICD-10-CM) - Left hand pain M25.842 (ICD-10-CM) - Sesamoiditis of left hand M65.322 (ICD-10-CM) - Trigger index finger of left hand  THERAPY DIAG:  Stiffness of right hand, not elsewhere classified  Pain in joint of right hand  Rationale for Evaluation and Treatment: Rehabilitation  SUBJECTIVE:   SUBJECTIVE STATEMENT: Pt reports he wakes up with tightness/ triggering   Pt accompanied by: self  PERTINENT  HISTORY: had injection 11/23/24 which helped.  Pain and L 2nd digit locking particularly in the morning, but some throughout the day.  Other fingers also beginning symptoms but very mild.    PMH:  prostate CA 2015, HLD, GERD, hearing aids, HTN, hypothyroidism, cardiac cath, R shoulder RTC and biceps repair, R shoulder surgical manip, L shoulder RTC repair, R elbow tendon repair, sleep apnea  PRECAUTIONS: None  RED FLAGS: None   WEIGHT BEARING RESTRICTIONS: No  PAIN:  Are you having pain? Yes: NPRS scale: 3-7/10 Pain location: L hand volar 2nd digit  Pain description: sharp Aggravating factors: gripping  Relieving factors: rest   FALLS: Has patient fallen in last 6 months? 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  HAND DOMINANCE: Right  ADLs: Overall ADLs: independent  Pt reports pain with gripping and opening items.  FUNCTIONAL OUTCOME MEASURES: Quick Dash: 15.9%  UPPER EXTREMITY ROM:  L hand ROM grossly WNL with min pain reported with full finger flexion   UPPER EXTREMITY MMT:   NT  HAND FUNCTION: Grip strength: Right: 42 lbs; Left: 44 lbs and Tip pinch: Right 12 lbs, Left: 10 lbs (4/10 pain with grip and 5/10 with pinch).    COORDINATION: WNL  SENSATION: WFL per pt report  COGNITION: Overall cognitive status: Within functional limits for tasks assessed  OBSERVATIONS: noted tightness in palm at base of 2nd digit   TREATMENT DATE:  01/05/25-Pt repots night time splint is work well with no issues. Pt rpeorts no triggering following wear US  3.11mhz, 0.8w/cm2 20% x 8 mins to palm and  digits for tightness. No adverse reactions. Massage to left palm due to tightness, followed by review of IP blocking exetcise. Pt was shown how to wrap index finger below PIP joint to prevent full composite finger flexion. Pt was also fitte with a ring splint to prevent full composite finger flexion to minimize triggering. Pt was instructed in wear cre and precautions.   01/03/25- Therapist fabricated a night time extension splint for LUE, digits 2-5, wrist and thumb free. (MP's extended slight IP flexion. Pt was educated in splint wear, care and precautions. US  3.3 mhz, 0.8 w/cm 2, 20% x 8 mins to L palm and digits for tightness, inflammation. No adverse reactions.  Reviewed previously issued HEP for gentle composite flexion, IP flexion with MP extension. 01/01/24:  Evaluation Completed.                                                                                                                               PATIENT EDUCATION: Education details: ring splint wear, care and precautions. Person educated: Patient Education method: Explanation, Demonstration, Verbal cues, and Handouts Education comprehension: verbalized understanding, returned demonstration, and verbal cues required  HOME EXERCISE PROGRAM: 01/01/24:  initial ROM HEP  GOALS: Goals reviewed with patient? Yes  SHORT TERM GOALS: Target date: 02/01/25  Pt will be independent with initial HEP. Goal status: ongoing 01/03/25  2.  Pt will be independent with splint wear/care. Goal status: ongoing, night time splint issued 01/03/25  3.  Pt will verbalize understanding of activity modifications to decr pain/symptoms. Goal status: INITIAL  4.  Pt will report pain consistently less than or equal to 5/10 with ADLs/IADLs. Goal status: INITIAL   LONG TERM GOALS: Target date: 03/01/25  Pt will be independent with updated HEP. Goal status: INITIAL  2.  Pt will improve L hand functional use/pain as shown by improving Quick Dash to 8%  or less. Baseline: 15.9% Goal status: INITIAL  3.  Pt will report L hand pain consistently less than 3/10 for ADLs/IADLs. Goal status: INITIAL   ASSESSMENT:  CLINICAL IMPRESSION: Patient is progressing towards goals. Pt reports no triggering after sleeping in extension splint. He demonstrates understanding of ring splint wear, care and precautions. PERFORMANCE DEFICITS: in  functional skills including ADLs, IADLs, ROM, strength, pain, decreased knowledge of precautions, decreased knowledge of use of DME, and UE functional use, cognitive skills including  and psychosocial skills including habits.   IMPAIRMENTS: are limiting patient from ADLs, IADLs, rest and sleep, play, and leisure.   COMORBIDITIES: may have co-morbidities  that affects occupational performance. Patient will benefit from skilled OT to address above impairments and improve overall function.  MODIFICATION OR ASSISTANCE TO COMPLETE EVALUATION: No modification of tasks or assist necessary to complete an evaluation.  OT OCCUPATIONAL PROFILE AND HISTORY: Detailed assessment: Review of records and additional review of physical, cognitive, psychosocial history related to current functional performance.  CLINICAL DECISION MAKING: LOW - limited treatment options, no task modification necessary  REHAB POTENTIAL: Good  EVALUATION COMPLEXITY: Low      PLAN:  OT FREQUENCY: 2x/week  OT DURATION: 8 weeks +eval  PLANNED INTERVENTIONS: 97535 self care/ADL training, 02889 therapeutic exercise, 97530 therapeutic activity, 97112 neuromuscular re-education, 97140 manual therapy, 97035 ultrasound, 97018 paraffin, 02989 moist heat, 97010 cryotherapy, 97033 iontophoresis, 97760 Orthotic Initial, and 97763 Orthotic/Prosthetic subsequent  RECOMMENDED OTHER SERVICES: none at this time  CONSULTED AND AGREED WITH PLAN OF CARE: Patient  PLAN FOR NEXT SESSION: splint check and modifications, US , massage,    Kenosha Doster,  OTR/L 01/05/2025, 4:08 PM   "

## 2025-01-05 NOTE — Patient Instructions (Signed)
 Finger splint Wear index finger ring splint only during daytime. Remove if pressure areas or pain. Wear during repetitive activities.

## 2025-01-08 ENCOUNTER — Other Ambulatory Visit: Payer: Self-pay | Admitting: Family Medicine

## 2025-01-08 DIAGNOSIS — K219 Gastro-esophageal reflux disease without esophagitis: Secondary | ICD-10-CM

## 2025-01-10 ENCOUNTER — Ambulatory Visit
Admission: EM | Admit: 2025-01-10 | Discharge: 2025-01-10 | Disposition: A | Discharge status: Home / Self Care | Attending: Family Medicine | Admitting: Family Medicine

## 2025-01-10 DIAGNOSIS — J309 Allergic rhinitis, unspecified: Secondary | ICD-10-CM | POA: Diagnosis not present

## 2025-01-10 DIAGNOSIS — R059 Cough, unspecified: Secondary | ICD-10-CM

## 2025-01-10 DIAGNOSIS — J0100 Acute maxillary sinusitis, unspecified: Secondary | ICD-10-CM | POA: Diagnosis not present

## 2025-01-10 MED ORDER — AMOXICILLIN-POT CLAVULANATE 875-125 MG PO TABS: 1.0000 | ORAL_TABLET | Freq: Two times a day (BID) | ORAL | 0 refills | Status: DC

## 2025-01-10 MED ORDER — AZELASTINE HCL 0.1 % NA SOLN: 2.0000 | Freq: Two times a day (BID) | NASAL | 0 refills | Status: AC

## 2025-01-10 MED ORDER — PREDNISONE 20 MG PO TABS: ORAL_TABLET | ORAL | 0 refills | Status: DC

## 2025-01-10 MED ORDER — HYDROCODONE BIT-HOMATROP MBR 5-1.5 MG/5ML PO SOLN: 5.0000 mL | Freq: Four times a day (QID) | ORAL | 0 refills | Status: AC | PRN

## 2025-01-10 NOTE — ED Provider Notes (Signed)
 " [name]    CSN: [id number] Arrival date & time: 01/10/25  0949      History   Chief Complaint No chief complaint on file.   HPI Zachary [name] is a 74 y.o. male.   HPI 74 male presents with sinus nasal congestion, postnasal drainage cough for 3 days.  PMH significant for stroke, HTN, and prostate cancer.  Patient is accompanied by his wife who will also be evaluated today.  Past Medical History:  Diagnosis Date   Allergy Seasonal/dust   take Loratadine  10 mg daily   Arthritis    Articular cartilage disease    left shoulder   CAD (coronary artery disease)    Diverticulitis    Dyslipidemia (high LDL; low HDL) 08/15/2016   GERD (gastroesophageal reflux disease)    Hearing problem    hearing deficit   Hemorrhoids    History of back surgery    Hypertension    off meds since 05/25/23 -   Hypothyroidism    Prostate cancer (HCC) 2015   treated and in remission   Sclerosing mesenteritis (HCC)    diagnosed 2022   Sleep apnea    uses a cpap   Stroke (HCC)    TIA -affected balance and went through PT.   Wears glasses    Wears hearing aid    both ears    Patient Active Problem List   Diagnosis Date Noted   S/P laparoscopic-assisted sigmoidectomy 09/11/2023   Cough 05/24/2023   Acute diverticulitis 05/23/2023   Hearing aid worn 02/28/2021   Coronary artery disease involving autologous artery coronary bypass graft with angina pectoris 01/22/2021   Chest pain 01/15/2021   Ingrowing nail, left great toe 07/12/2020   Radiation cystitis 04/09/2019   Bladder tumor 03/31/2019   Contracture of left Achilles tendon 10/26/2018   Plantar fasciitis of left foot 08/25/2018   Prediabetes 05/27/2018   Stomatitis 04/17/2017   Dyslipidemia (high LDL; low HDL) 08/15/2016   S/P arthroscopy of shoulder 05/31/2016   History of prostate cancer 05/09/2016   Complete rotator cuff tear of left shoulder 07/05/2015   Tennis elbow 05/30/2014   Rotator cuff (capsule)  sprain 03/10/2014   Right shoulder pain 02/11/2014   Memory difficulty 12/18/2012   Gynecomastia, male 07/30/2012   OSA (obstructive sleep apnea) 04/23/2011   External hemorrhoids 02/12/2011   Prostate cancer (HCC) 11/12/2010   PLANTAR FASCIITIS 04/09/2010   Hypothyroidism 12/08/2009   NEOPLASM OF UNCERTAIN BEHAVIOR OF SKIN 03/10/2009   Osteoarthritis 02/14/2009   Mixed hyperlipidemia 02/08/2009   Hearing loss 02/08/2009   Essential hypertension 02/08/2009   Allergic rhinitis 02/08/2009   GERD without esophagitis 02/08/2009   PROTEINURIA 02/08/2009   DIVERTICULITIS, HX OF 02/08/2009    Past Surgical History:  Procedure Laterality Date   BACK SURGERY  12/23/1976   herniated disksurgery   CARDIAC CATHETERIZATION  08/01/2010   COLONOSCOPY     2007, 2017 and 2023   EXAM UNDER ANESTHESIA WITH MANIPULATION OF SHOULDER Right 09/15/2014   Procedure: RIGHT SHOULDER MANIPULATION UNDER ANESTHESIA;  Surgeon: Toribio JULIANNA Chancy, MD;  Location: [facility];  Service: Orthopedics;  Laterality: Right;   FLEXIBLE SIGMOIDOSCOPY  09/11/2023   Procedure: FLEXIBLE SIGMOIDOSCOPY;  Surgeon: Teresa Lonni HERO, MD;  Location: [facility];  Service: General;;   HEMORRHOID SURGERY  08/23/2006   LEFT HEART CATH AND CORONARY ANGIOGRAPHY N/A 01/16/2021   Procedure: LEFT HEART CATH AND CORONARY ANGIOGRAPHY;  Surgeon: Burnard Debby LABOR, MD;  Location: [facility];  Service: Cardiovascular;  Laterality: N/A;   SHOULDER ARTHROSCOPY WITH DISTAL CLAVICLE RESECTION Left 07/07/2015   Procedure: SHOULDER ARTHROSCOPY WITH DISTAL CLAVICLE RESECTION;  Surgeon: Toribio Chancy, MD;  Location: [facility];  Service: Orthopedics;  Laterality: Left;   SHOULDER ARTHROSCOPY WITH ROTATOR CUFF REPAIR Left 12/07/2015   Procedure: LEFT SHOULDER ARTHROSCOPY DEBRIDEMENT, WITH ROTATOR CUFF REPAIR;  Surgeon: Toribio JULIANNA Chancy, MD;  Location: [facility];  Service: Orthopedics;  Laterality: Left;    SHOULDER ARTHROSCOPY WITH ROTATOR CUFF REPAIR AND SUBACROMIAL DECOMPRESSION Left 07/07/2015   Procedure: LEFT SHOULDER SCOPE DEBRIDEMENT, SUBACROMIAL DECOMPRESSION, DISTAL CLAVICULECTOMY, ROTATOR CUFF REPAIR  ;  Surgeon: Toribio Chancy, MD;  Location: [facility];  Service: Orthopedics;  Laterality: Left;  ANESTHESIA: GENERAL, PRE/POST OP SCALENE   SHOULDER ARTHROSCOPY WITH SUBACROMIAL DECOMPRESSION, ROTATOR CUFF REPAIR AND BICEP TENDON REPAIR Right 03/10/2014   Procedure: RIGHT SHOULDER ARTHROSCOPY WITH SUBACROMIAL DECOMPRESSION, PARTIAL ACROMIOPLASTY WITH CORACOAROMIAL LABRUM DEBRIDEMENT RELEASE DISTAL CLAVICULECTOMY,  ROTATOR CUFF REPAIR AND EXTENSIVE DEBRIDEMENT;  Surgeon: Toribio JULIANNA Chancy, MD;  Location: [facility];  Service: Orthopedics;  Laterality: Right;   SPINE SURGERY  Disc repaired 12/1976   TENDON REPAIR  06/06/2011   right elbow, Dr. Chancy   XI ROBOTIC ASSISTED LOWER ANTERIOR RESECTION N/A 09/11/2023   Procedure: XI ROBOTIC ASSISTED LOW ANTERIOR RESECTION/ SIGMOIDECTOMY AND INTRAOPERATIVE ASSESSMENT OF PERFUSION USING ICG DYE;  Surgeon: Teresa Lonni HERO, MD;  Location: [facility];  Service: General;  Laterality: N/A;       Home Medications    Prior to Admission medications  Medication Sig Start Date End Date Taking? Authorizing Provider  amoxicillin -clavulanate (AUGMENTIN ) 875-125 MG tablet Take 1 tablet by mouth every 12 (twelve) hours. 01/10/25  Yes Agustina Witzke, FNP  azelastine  (ASTELIN ) 0.1 % nasal spray Place 2 sprays into both nostrils 2 (two) times daily. Use in each nostril as directed 01/10/25  Yes Teddy Sharper, FNP  HYDROcodone  bit-homatropine (HYCODAN) 5-1.5 MG/5ML syrup Take 5 mLs by mouth every 6 (six) hours as needed for cough. 01/10/25  Yes Teddy Sharper, FNP  predniSONE  (DELTASONE ) 20 MG tablet Take 3 tabs PO daily x 5 days. 01/10/25  Yes Teddy Sharper, FNP  aspirin  EC 81 MG EC tablet Take 81 mg by mouth daily.    [provider]  Hydrocortisone  (PREPARATION H EX) Apply 1 Application topically daily as needed (hemorrhoids).    [provider]  hydrocortisone  2.5 % cream Apply topically 2 (two) times daily. 11/09/24   Frann Mabel Mt, DO  levothyroxine  (SYNTHROID ) 100 MCG tablet Take 1 tablet by mouth once daily 12/20/24   Frann, Mabel Mt, DO  methocarbamol  (ROBAXIN ) 500 MG tablet Take 1 tablet (500 mg total) by mouth every 8 (eight) hours as needed for muscle spasms. 11/22/24   Frann Mabel Mt, DO  olmesartan  (BENICAR ) 20 MG tablet Take 1 tablet (20 mg total) by mouth daily. 10/25/24   Frann Mabel Mt, DO  omeprazole  (PRILOSEC) 20 MG capsule Take 1 capsule by mouth once daily 01/10/25   Frann Mabel Mt, DO  pravastatin  (PRAVACHOL ) 80 MG tablet Take 1 tablet by mouth once daily 12/13/24   Frann Mabel Mt, DO  sodium bicarbonate  650 MG tablet Take 1 tablet by mouth once daily 01/10/25   Frann Mabel Mt, DO    Family History Family History  Problem Relation Age of Onset   Colon polyps Mother    Breast cancer Mother    Arthritis Mother  Cancer Mother    Hearing loss Mother    Miscarriages / Stillbirths Mother    Varicose Veins Mother    Heart disease Father 13   Alcohol abuse Father    Asthma Father    Hypertension Father    Cancer Brother    Alcohol abuse Other    Arthritis Other    Cancer Other        Breast, Prostate   Coronary artery disease Other    Irritable bowel syndrome Other    Cystic fibrosis Other    Anxiety disorder Son    Colon cancer Neg Hx    Esophageal cancer Neg Hx    Stomach cancer Neg Hx    Rectal cancer Neg Hx     Social History Social History[1]   Allergies   Oxycodone , Amlodipine , Atorvastatin , Mobic  [meloxicam ], and Simvastatin   Review of Systems Review of Systems  HENT:  Positive for congestion, postnasal drip, rhinorrhea, sinus pressure and sinus pain.   Respiratory:  Positive for cough.   All other  systems reviewed and are negative.    Physical Exam Triage Vital Signs ED Triage Vitals  Encounter Vitals Group     BP 01/10/25 1007 120/78     Girls Systolic BP Percentile --      Girls Diastolic BP Percentile --      Boys Systolic BP Percentile --      Boys Diastolic BP Percentile --      Pulse Rate 01/10/25 1007 100     Resp 01/10/25 1007 20     Temp 01/10/25 1007 99.5 F (37.5 C)     Temp Source 01/10/25 1007 Oral     SpO2 01/10/25 1007 92 %     Weight --      Height --      Head Circumference --      Peak Flow --      Pain Score 01/10/25 1003 0     Pain Loc --      Pain Education --      Exclude from Growth Chart --    No data found.  Updated Vital Signs BP 120/78 (BP Location: Right Arm)   Pulse 100   Temp 99.5 F (37.5 C) (Oral)   Resp 20   SpO2 92%   Visual Acuity Right Eye Distance:   Left Eye Distance:   Bilateral Distance:    Right Eye Near:   Left Eye Near:    Bilateral Near:     Physical Exam Vitals and nursing note reviewed.  Constitutional:      Appearance: Normal appearance. He is obese. He is ill-appearing.  HENT:     Head: Normocephalic and atraumatic.     Right Ear: Tympanic membrane and external ear normal.     Left Ear: Tympanic membrane and external ear normal.     Ears:     Comments: Significant eustachian tube dysfunction noted bilaterally    Mouth/Throat:     Mouth: Mucous membranes are moist.     Pharynx: Oropharynx is clear.     Comments: Significant amount of clear drainage of posterior oropharynx noted Eyes:     Extraocular Movements: Extraocular movements intact.     Conjunctiva/sclera: Conjunctivae normal.     Pupils: Pupils are equal, round, and reactive to light.  Cardiovascular:     Rate and Rhythm: Normal rate and regular rhythm.     Heart sounds: Normal heart sounds.  Pulmonary:     Effort: Pulmonary effort  is normal.     Breath sounds: Normal breath sounds. No wheezing, rhonchi or rales.     Comments: Frequent  nonproductive cough on exam Musculoskeletal:        General: Normal range of motion.     Cervical back: Normal range of motion and neck supple.  Skin:    General: Skin is warm and dry.  Neurological:     General: No focal deficit present.     Mental Status: He is alert and oriented to person, place, and time. Mental status is at baseline.  Psychiatric:        Mood and Affect: Mood normal.        Behavior: Behavior normal.      UC Treatments / Results  Labs (all labs ordered are listed, but only abnormal results are displayed) Labs Reviewed - No data to display  EKG   Radiology No results found.  Procedures Procedures (including critical care time)  Medications Ordered in UC Medications - No data to display  Initial Impression / Assessment and Plan / UC Course  I have reviewed the triage vital signs and the nursing notes.  Pertinent labs & imaging results that were available during my care of the patient were reviewed by me and considered in my medical decision making (see chart for details).     DM: 1.  Acute nonrecurrent maxillary sinusitis-Rx'd Augmentin  875/125 mg tablet: Take 1 tablet twice daily x 7 days; 2.  Cough, unspecified type-Rx'd prednisone  20 mg tablet: Take 3 tablets p.o. daily x 5 days, Rx'd Hycodan 5-1.5 mg / 5 mL syrup: Take 5 mL every 6 hours, as needed for cough.;  3.  Allergic rhinitis, unspecified seasonality, unspecified trigger-Rx'd Astelin  0.1% nasal spray: Place 2 sprays into both nostril 2 times daily for the next 5 days. Advised patient take medications as directed with food to completion.  Advised take prednisone  with first dose of Augmentin  for the next 5 of 7 days.  May use Astelin  twice daily for the next 5 days for concurrent postnasal drainage/drip/runny nose.  Advised may use Hycodan cough syrup at night prior to sleep for cough due to sedative effects.  Encouraged increase daily water intake to 64 ounces per day while taking these  medications.  Advised if symptoms worsen and/or unresolved please follow-up with your PCP or here for further evaluation.  Patient discharged home, hemodynamically stable Final Clinical Impressions(s) / UC Diagnoses   Final diagnoses:  Acute non-recurrent maxillary sinusitis  Cough, unspecified type  Allergic rhinitis, unspecified seasonality, unspecified trigger     Discharge Instructions      Advised patient take medications as directed with food to completion.  Advised take prednisone  with first dose of Augmentin  for the next 5 of 7 days.  May use Astelin  twice daily for the next 5 days for concurrent postnasal drainage/drip/runny nose.  Advised may use Hycodan cough syrup at night prior to sleep for cough due to sedative effects.  Encouraged increase daily water intake to 64 ounces per day while taking these medications.  Advised if symptoms worsen and/or unresolved please follow-up with your PCP or here for further evaluation.     ED Prescriptions     Medication Sig Dispense Auth. Provider   amoxicillin -clavulanate (AUGMENTIN ) 875-125 MG tablet Take 1 tablet by mouth every 12 (twelve) hours. 14 tablet Oziel Beitler, FNP   azelastine  (ASTELIN ) 0.1 % nasal spray Place 2 sprays into both nostrils 2 (two) times daily. Use in each nostril as directed  30 mL Teddy Sharper, FNP   predniSONE  (DELTASONE ) 20 MG tablet Take 3 tabs PO daily x 5 days. 15 tablet Jossalyn Forgione, FNP   HYDROcodone  bit-homatropine (HYCODAN) 5-1.5 MG/5ML syrup Take 5 mLs by mouth every 6 (six) hours as needed for cough. 120 mL Teddy Sharper, FNP      I have reviewed the PDMP during this encounter.    [1]  Social History Tobacco Use   Smoking status: Never   Smokeless tobacco: Never  Vaping Use   Vaping status: Never Used  Substance Use Topics   Alcohol use: Yes    Alcohol/week: 1.0 standard drink of alcohol    Types: 1 Glasses of wine per week    Comment: Glass of wine occasionally   Drug use: No      Teddy Sharper, FNP 01/10/25 1102  "

## 2025-01-10 NOTE — ED Triage Notes (Signed)
 Pt reports cough, fatigue, drainage and body aches x 1 days. Taking Tylenol , Tessalon  gives no relief.

## 2025-01-10 NOTE — Discharge Instructions (Addendum)
 Advised patient take medications as directed with food to completion.  Advised take prednisone  with first dose of Augmentin  for the next 5 of 7 days.  May use Astelin  twice daily for the next 5 days for concurrent postnasal drainage/drip/runny nose.  Advised may use Hycodan cough syrup at night prior to sleep for cough due to sedative effects.  Encouraged increase daily water intake to 64 ounces per day while taking these medications.  Advised if symptoms worsen and/or unresolved please follow-up with your PCP or here for further evaluation.

## 2025-01-11 ENCOUNTER — Ambulatory Visit: Admitting: Occupational Therapy

## 2025-01-13 ENCOUNTER — Encounter: Payer: Self-pay | Admitting: Family Medicine

## 2025-01-17 ENCOUNTER — Ambulatory Visit: Admitting: Occupational Therapy

## 2025-01-18 ENCOUNTER — Encounter: Payer: Self-pay | Admitting: Family Medicine

## 2025-01-18 ENCOUNTER — Ambulatory Visit: Admitting: Family Medicine

## 2025-01-18 VITALS — BP 118/76 | HR 85 | Temp 98.0°F | Resp 16 | Ht 68.0 in | Wt 216.8 lb

## 2025-01-18 DIAGNOSIS — J0100 Acute maxillary sinusitis, unspecified: Secondary | ICD-10-CM | POA: Diagnosis not present

## 2025-01-18 MED ORDER — DOXYCYCLINE HYCLATE 100 MG PO TABS: 100.0000 mg | ORAL_TABLET | Freq: Two times a day (BID) | ORAL | 0 refills | Status: AC

## 2025-01-18 NOTE — Patient Instructions (Addendum)
 Continue to push fluids, practice good hand hygiene, and cover your mouth if you cough.  If you start having fevers, shaking or shortness of breath, seek immediate care.  OK to take Tylenol  1000 mg (2 extra strength tabs) or 975 mg (3 regular strength tabs) every 6 hours as needed.  Send me a message in 2-3 if not obviously improving.  Let us  know if you need anything.

## 2025-01-18 NOTE — Progress Notes (Signed)
 Chief Complaint  Patient presents with   Cough    Cough     Zachary [name] here for URI complaints.  Duration: 10 days - was getting better and got worse 2 d ago Associated symptoms: sinus congestion, rhinorrhea, sore throat, wheezing, fatigue, and coughing Denies: sinus pain, itchy watery eyes, ear pain, ear drainage, shortness of breath, myalgia, and fevers Treatment to date: Astelin , Hycodan, Augmentin , Prednisone  Sick contacts: Yes- wife  Past Medical History:  Diagnosis Date   Allergy Seasonal/dust   take Loratadine  10 mg daily   Arthritis    Articular cartilage disease    left shoulder   CAD (coronary artery disease)    Diverticulitis    Dyslipidemia (high LDL; low HDL) 08/15/2016   GERD (gastroesophageal reflux disease)    Hearing problem    hearing deficit   Hemorrhoids    History of back surgery    Hypertension    off meds since 05/25/23 -   Hypothyroidism    Prostate cancer (HCC) 2015   treated and in remission   Sclerosing mesenteritis (HCC)    diagnosed 2022   Sleep apnea    uses a cpap   Stroke (HCC)    TIA -affected balance and went through PT.   Wears glasses    Wears hearing aid    both ears    Objective BP 118/76 (BP Location: Left Arm, Patient Position: Sitting)   Pulse 85   Temp 98 F (36.7 C) (Oral)   Resp 16   Ht 5' 8 (1.727 m)   Wt 216 lb 12.8 oz (98.3 kg)   SpO2 98%   BMI 32.96 kg/m  General: Awake, alert, appears stated age HEENT: AT, [place], ears patent b/l and TM's neg, nares patent w/o discharge, pharynx pink and without exudates, MMM, + maxillary sinus ttp b/l Neck: No masses or asymmetry Heart: RRR Lungs: CTAB mostly with the exception of coarse breath sounds heard in the left lower lung field, no accessory muscle use Psych: Age appropriate judgment and insight, normal mood and affect  Acute maxillary sinusitis, recurrence not specified  Change to doxycycline  100 mg twice daily for 7 days.  Send message in 2 to 3 days if not  obviously improving and we will check a chest x-ray.  Continue to push fluids, practice good hand hygiene, cover mouth when coughing. F/u prn. If starting to experience worsening symptoms, shaking, or shortness of breath, seek immediate care. Pt voiced understanding and agreement to the plan.  Mabel Mt [place], DO 01/18/25 11:59 AM

## 2025-01-20 ENCOUNTER — Ambulatory Visit: Admitting: Occupational Therapy

## 2025-01-21 ENCOUNTER — Encounter: Payer: Self-pay | Admitting: Family Medicine

## 2025-01-25 ENCOUNTER — Ambulatory Visit: Admitting: Occupational Therapy

## 2025-01-27 ENCOUNTER — Ambulatory Visit: Admitting: Occupational Therapy

## 2025-02-01 ENCOUNTER — Ambulatory Visit: Admitting: Occupational Therapy

## 2025-09-20 ENCOUNTER — Ambulatory Visit
